# Patient Record
Sex: Female | Born: 1945
Health system: Southern US, Community
[De-identification: ages and names within clinical notes are randomized; demographics above are authoritative.]

## PROBLEM LIST (undated history)

## (undated) DIAGNOSIS — Z923 Personal history of irradiation: Secondary | ICD-10-CM

## (undated) DIAGNOSIS — Z8619 Personal history of other infectious and parasitic diseases: Secondary | ICD-10-CM

## (undated) DIAGNOSIS — F419 Anxiety disorder, unspecified: Secondary | ICD-10-CM

## (undated) DIAGNOSIS — K279 Peptic ulcer, site unspecified, unspecified as acute or chronic, without hemorrhage or perforation: Secondary | ICD-10-CM

## (undated) DIAGNOSIS — I119 Hypertensive heart disease without heart failure: Secondary | ICD-10-CM

## (undated) DIAGNOSIS — I6529 Occlusion and stenosis of unspecified carotid artery: Secondary | ICD-10-CM

## (undated) DIAGNOSIS — K219 Gastro-esophageal reflux disease without esophagitis: Secondary | ICD-10-CM

## (undated) DIAGNOSIS — I5032 Chronic diastolic (congestive) heart failure: Secondary | ICD-10-CM

## (undated) DIAGNOSIS — J449 Chronic obstructive pulmonary disease, unspecified: Secondary | ICD-10-CM

## (undated) DIAGNOSIS — D369 Benign neoplasm, unspecified site: Secondary | ICD-10-CM

## (undated) DIAGNOSIS — K5730 Diverticulosis of large intestine without perforation or abscess without bleeding: Secondary | ICD-10-CM

## (undated) DIAGNOSIS — I2510 Atherosclerotic heart disease of native coronary artery without angina pectoris: Secondary | ICD-10-CM

## (undated) DIAGNOSIS — E119 Type 2 diabetes mellitus without complications: Secondary | ICD-10-CM

## (undated) DIAGNOSIS — K648 Other hemorrhoids: Secondary | ICD-10-CM

## (undated) DIAGNOSIS — G4730 Sleep apnea, unspecified: Secondary | ICD-10-CM

## (undated) DIAGNOSIS — I10 Essential (primary) hypertension: Secondary | ICD-10-CM

## (undated) DIAGNOSIS — M1990 Unspecified osteoarthritis, unspecified site: Secondary | ICD-10-CM

## (undated) HISTORY — DX: Hypertensive heart disease without heart failure: I11.9

## (undated) HISTORY — DX: Gastro-esophageal reflux disease without esophagitis: K21.9

## (undated) HISTORY — DX: Benign neoplasm, unspecified site: D36.9

## (undated) HISTORY — PX: TUBAL LIGATION: SHX77

## (undated) HISTORY — PX: BREAST EXCISIONAL BIOPSY: SUR124

## (undated) HISTORY — DX: Personal history of other infectious and parasitic diseases: Z86.19

## (undated) HISTORY — PX: BREAST SURGERY: SHX581

## (undated) HISTORY — PX: OTHER SURGICAL HISTORY: SHX169

## (undated) HISTORY — DX: Diverticulosis of large intestine without perforation or abscess without bleeding: K57.30

## (undated) HISTORY — DX: Other hemorrhoids: K64.8

## (undated) HISTORY — PX: CORONARY ANGIOPLASTY: SHX604

## (undated) HISTORY — DX: Chronic diastolic (congestive) heart failure: I50.32

## (undated) HISTORY — DX: Peptic ulcer, site unspecified, unspecified as acute or chronic, without hemorrhage or perforation: K27.9

## (undated) HISTORY — DX: Anxiety disorder, unspecified: F41.9

## (undated) HISTORY — DX: Essential (primary) hypertension: I10

---

## 1987-10-27 DIAGNOSIS — K279 Peptic ulcer, site unspecified, unspecified as acute or chronic, without hemorrhage or perforation: Secondary | ICD-10-CM

## 1987-10-27 HISTORY — DX: Peptic ulcer, site unspecified, unspecified as acute or chronic, without hemorrhage or perforation: K27.9

## 2000-04-13 ENCOUNTER — Other Ambulatory Visit: Admission: RE | Admit: 2000-04-13 | Discharge: 2000-04-13 | Payer: Self-pay | Admitting: Internal Medicine

## 2000-10-26 DIAGNOSIS — Z124 Encounter for screening for malignant neoplasm of cervix: Secondary | ICD-10-CM

## 2000-10-26 HISTORY — DX: Encounter for screening for malignant neoplasm of cervix: Z12.4

## 2001-10-26 DIAGNOSIS — J45909 Unspecified asthma, uncomplicated: Secondary | ICD-10-CM

## 2001-10-26 DIAGNOSIS — G4700 Insomnia, unspecified: Secondary | ICD-10-CM

## 2001-10-26 HISTORY — DX: Insomnia, unspecified: G47.00

## 2001-10-26 HISTORY — DX: Unspecified asthma, uncomplicated: J45.909

## 2002-01-03 ENCOUNTER — Other Ambulatory Visit: Admission: RE | Admit: 2002-01-03 | Discharge: 2002-01-03 | Payer: Self-pay | Admitting: Internal Medicine

## 2003-04-25 ENCOUNTER — Other Ambulatory Visit: Admission: RE | Admit: 2003-04-25 | Discharge: 2003-04-25 | Payer: Self-pay | Admitting: Internal Medicine

## 2003-11-08 ENCOUNTER — Encounter: Admission: RE | Admit: 2003-11-08 | Discharge: 2003-11-08 | Payer: Self-pay | Admitting: Internal Medicine

## 2004-09-22 ENCOUNTER — Other Ambulatory Visit: Admission: RE | Admit: 2004-09-22 | Discharge: 2004-09-22 | Payer: Self-pay | Admitting: Internal Medicine

## 2004-09-22 ENCOUNTER — Ambulatory Visit: Payer: Self-pay | Admitting: Internal Medicine

## 2004-10-02 ENCOUNTER — Ambulatory Visit: Payer: Self-pay | Admitting: Internal Medicine

## 2004-10-10 ENCOUNTER — Ambulatory Visit: Payer: Self-pay | Admitting: Internal Medicine

## 2005-01-08 ENCOUNTER — Ambulatory Visit: Payer: Self-pay | Admitting: Internal Medicine

## 2005-02-19 ENCOUNTER — Ambulatory Visit: Payer: Self-pay | Admitting: Internal Medicine

## 2005-04-21 ENCOUNTER — Ambulatory Visit: Payer: Self-pay | Admitting: Internal Medicine

## 2005-07-21 ENCOUNTER — Ambulatory Visit: Payer: Self-pay | Admitting: Internal Medicine

## 2005-10-15 ENCOUNTER — Ambulatory Visit: Payer: Self-pay | Admitting: Internal Medicine

## 2005-10-27 ENCOUNTER — Ambulatory Visit: Payer: Self-pay | Admitting: Internal Medicine

## 2005-11-03 ENCOUNTER — Ambulatory Visit: Payer: Self-pay | Admitting: Internal Medicine

## 2005-11-11 ENCOUNTER — Ambulatory Visit: Payer: Self-pay | Admitting: Gastroenterology

## 2005-11-17 ENCOUNTER — Ambulatory Visit: Payer: Self-pay | Admitting: Internal Medicine

## 2005-11-23 ENCOUNTER — Encounter (INDEPENDENT_AMBULATORY_CARE_PROVIDER_SITE_OTHER): Payer: Self-pay | Admitting: *Deleted

## 2005-11-23 ENCOUNTER — Ambulatory Visit: Payer: Self-pay | Admitting: Gastroenterology

## 2005-11-23 DIAGNOSIS — D369 Benign neoplasm, unspecified site: Secondary | ICD-10-CM

## 2005-11-23 HISTORY — DX: Benign neoplasm, unspecified site: D36.9

## 2005-12-08 ENCOUNTER — Other Ambulatory Visit: Admission: RE | Admit: 2005-12-08 | Discharge: 2005-12-08 | Payer: Self-pay | Admitting: Obstetrics and Gynecology

## 2005-12-17 ENCOUNTER — Ambulatory Visit: Payer: Self-pay | Admitting: Internal Medicine

## 2005-12-29 ENCOUNTER — Ambulatory Visit: Payer: Self-pay | Admitting: Internal Medicine

## 2006-02-26 ENCOUNTER — Ambulatory Visit: Payer: Self-pay | Admitting: Internal Medicine

## 2006-03-13 ENCOUNTER — Emergency Department (HOSPITAL_COMMUNITY): Admission: EM | Admit: 2006-03-13 | Discharge: 2006-03-13 | Payer: Self-pay | Admitting: Emergency Medicine

## 2006-03-15 ENCOUNTER — Ambulatory Visit: Payer: Self-pay | Admitting: Internal Medicine

## 2006-03-30 ENCOUNTER — Ambulatory Visit: Payer: Self-pay | Admitting: Internal Medicine

## 2006-05-31 ENCOUNTER — Ambulatory Visit: Payer: Self-pay | Admitting: Internal Medicine

## 2006-07-30 ENCOUNTER — Ambulatory Visit: Payer: Self-pay | Admitting: Internal Medicine

## 2006-08-06 ENCOUNTER — Ambulatory Visit: Payer: Self-pay | Admitting: Internal Medicine

## 2006-09-30 ENCOUNTER — Ambulatory Visit: Payer: Self-pay | Admitting: Internal Medicine

## 2006-10-26 DIAGNOSIS — I10 Essential (primary) hypertension: Secondary | ICD-10-CM

## 2006-10-26 DIAGNOSIS — M1990 Unspecified osteoarthritis, unspecified site: Secondary | ICD-10-CM

## 2006-10-26 DIAGNOSIS — F32A Depression, unspecified: Secondary | ICD-10-CM

## 2006-10-26 HISTORY — DX: Unspecified osteoarthritis, unspecified site: M19.90

## 2006-10-26 HISTORY — DX: Depression, unspecified: F32.A

## 2006-10-26 HISTORY — DX: Essential (primary) hypertension: I10

## 2006-11-02 ENCOUNTER — Ambulatory Visit: Payer: Self-pay | Admitting: Internal Medicine

## 2006-12-14 ENCOUNTER — Ambulatory Visit: Payer: Self-pay | Admitting: Internal Medicine

## 2007-02-28 ENCOUNTER — Ambulatory Visit: Payer: Self-pay | Admitting: Internal Medicine

## 2007-02-28 LAB — CONVERTED CEMR LAB
ALT: 17 units/L (ref 0–40)
AST: 20 units/L (ref 0–37)
Albumin: 3.7 g/dL (ref 3.5–5.2)
Alkaline Phosphatase: 76 units/L (ref 39–117)
BUN: 15 mg/dL (ref 6–23)
Bilirubin, Direct: 0.1 mg/dL (ref 0.0–0.3)
CO2: 32 meq/L (ref 19–32)
Calcium: 9.5 mg/dL (ref 8.4–10.5)
Chloride: 103 meq/L (ref 96–112)
Cholesterol: 182 mg/dL (ref 0–200)
Creatinine, Ser: 1 mg/dL (ref 0.4–1.2)
Direct LDL: 91.9 mg/dL
GFR calc Af Amer: 73 mL/min
GFR calc non Af Amer: 60 mL/min
Glucose, Bld: 122 mg/dL — ABNORMAL HIGH (ref 70–99)
HDL: 30.5 mg/dL — ABNORMAL LOW (ref >39.0)
Potassium: 3.7 meq/L (ref 3.5–5.1)
Sodium: 143 meq/L (ref 135–145)
Total Bilirubin: 0.7 mg/dL (ref 0.3–1.2)
Total CHOL/HDL Ratio: 6
Total Protein: 6.9 g/dL (ref 6.0–8.3)
Triglycerides: 217 mg/dL (ref 0–149)
VLDL: 43 mg/dL — ABNORMAL HIGH (ref 0–40)

## 2007-03-07 ENCOUNTER — Ambulatory Visit: Payer: Self-pay | Admitting: Internal Medicine

## 2007-04-22 DIAGNOSIS — I10 Essential (primary) hypertension: Secondary | ICD-10-CM | POA: Insufficient documentation

## 2007-04-22 DIAGNOSIS — F329 Major depressive disorder, single episode, unspecified: Secondary | ICD-10-CM

## 2007-04-22 DIAGNOSIS — M1990 Unspecified osteoarthritis, unspecified site: Secondary | ICD-10-CM | POA: Insufficient documentation

## 2007-04-22 DIAGNOSIS — F32A Depression, unspecified: Secondary | ICD-10-CM | POA: Insufficient documentation

## 2007-04-22 DIAGNOSIS — K5730 Diverticulosis of large intestine without perforation or abscess without bleeding: Secondary | ICD-10-CM | POA: Insufficient documentation

## 2007-04-22 DIAGNOSIS — J45909 Unspecified asthma, uncomplicated: Secondary | ICD-10-CM | POA: Insufficient documentation

## 2007-04-22 HISTORY — DX: Diverticulosis of large intestine without perforation or abscess without bleeding: K57.30

## 2007-05-17 ENCOUNTER — Ambulatory Visit: Payer: Self-pay | Admitting: Internal Medicine

## 2007-05-17 DIAGNOSIS — M5137 Other intervertebral disc degeneration, lumbosacral region: Secondary | ICD-10-CM | POA: Insufficient documentation

## 2007-06-01 ENCOUNTER — Encounter: Payer: Self-pay | Admitting: Internal Medicine

## 2007-07-01 ENCOUNTER — Ambulatory Visit: Payer: Self-pay | Admitting: Internal Medicine

## 2007-07-01 DIAGNOSIS — M545 Low back pain, unspecified: Secondary | ICD-10-CM | POA: Insufficient documentation

## 2007-10-10 ENCOUNTER — Ambulatory Visit: Payer: Self-pay | Admitting: Internal Medicine

## 2007-10-10 DIAGNOSIS — M533 Sacrococcygeal disorders, not elsewhere classified: Secondary | ICD-10-CM | POA: Insufficient documentation

## 2007-12-13 ENCOUNTER — Ambulatory Visit: Payer: Self-pay | Admitting: Internal Medicine

## 2008-03-06 ENCOUNTER — Ambulatory Visit: Payer: Self-pay | Admitting: Internal Medicine

## 2008-06-08 ENCOUNTER — Ambulatory Visit: Payer: Self-pay | Admitting: Internal Medicine

## 2008-06-20 ENCOUNTER — Telehealth: Payer: Self-pay | Admitting: Internal Medicine

## 2008-08-10 ENCOUNTER — Ambulatory Visit: Payer: Self-pay | Admitting: Internal Medicine

## 2008-10-02 ENCOUNTER — Ambulatory Visit: Payer: Self-pay | Admitting: Internal Medicine

## 2008-10-04 ENCOUNTER — Telehealth: Payer: Self-pay | Admitting: Internal Medicine

## 2008-10-11 ENCOUNTER — Telehealth: Payer: Self-pay | Admitting: Internal Medicine

## 2008-10-29 ENCOUNTER — Ambulatory Visit: Payer: Self-pay | Admitting: Internal Medicine

## 2008-10-29 DIAGNOSIS — T7840XA Allergy, unspecified, initial encounter: Secondary | ICD-10-CM | POA: Insufficient documentation

## 2008-10-29 DIAGNOSIS — B029 Zoster without complications: Secondary | ICD-10-CM | POA: Insufficient documentation

## 2009-01-01 ENCOUNTER — Ambulatory Visit: Payer: Self-pay | Admitting: Internal Medicine

## 2009-01-01 DIAGNOSIS — J209 Acute bronchitis, unspecified: Secondary | ICD-10-CM | POA: Insufficient documentation

## 2009-02-01 ENCOUNTER — Ambulatory Visit: Payer: Self-pay | Admitting: Internal Medicine

## 2009-02-01 DIAGNOSIS — F458 Other somatoform disorders: Secondary | ICD-10-CM | POA: Insufficient documentation

## 2009-03-04 ENCOUNTER — Ambulatory Visit: Payer: Self-pay | Admitting: Internal Medicine

## 2009-03-04 DIAGNOSIS — R059 Cough, unspecified: Secondary | ICD-10-CM | POA: Insufficient documentation

## 2009-03-04 DIAGNOSIS — R05 Cough: Secondary | ICD-10-CM

## 2009-03-04 LAB — CONVERTED CEMR LAB
BUN: 15 mg/dL (ref 6–23)
Basophils Absolute: 0.1 10*3/uL (ref 0.0–0.1)
Basophils Relative: 0.9 % (ref 0.0–3.0)
CO2: 31 meq/L (ref 19–32)
Calcium: 10.1 mg/dL (ref 8.4–10.5)
Chloride: 103 meq/L (ref 96–112)
Creatinine, Ser: 1 mg/dL (ref 0.4–1.2)
Eosinophils Absolute: 0.3 10*3/uL (ref 0.0–0.7)
Eosinophils Relative: 2.7 % (ref 0.0–5.0)
GFR calc non Af Amer: 59.58 mL/min (ref >60)
Glucose, Bld: 83 mg/dL (ref 70–99)
HCT: 39.4 % (ref 36.0–46.0)
Hemoglobin: 13.3 g/dL (ref 12.0–15.0)
IgE (Immunoglobulin E), Serum: 91.5 intl units/mL (ref 0.0–180.0)
Lymphocytes Relative: 24 % (ref 12.0–46.0)
Lymphs Abs: 3 10*3/uL (ref 0.7–4.0)
MCHC: 33.6 g/dL (ref 30.0–36.0)
MCV: 92.2 fL (ref 78.0–100.0)
Monocytes Absolute: 0.1 10*3/uL (ref 0.1–1.0)
Monocytes Relative: 1.1 % — ABNORMAL LOW (ref 3.0–12.0)
Neutro Abs: 8.8 10*3/uL — ABNORMAL HIGH (ref 1.4–7.7)
Neutrophils Relative %: 71.3 % (ref 43.0–77.0)
Platelets: 347 10*3/uL (ref 150.0–400.0)
Potassium: 4.5 meq/L (ref 3.5–5.1)
RBC: 4.27 M/uL (ref 3.87–5.11)
RDW: 12.6 % (ref 11.5–14.6)
Sodium: 142 meq/L (ref 135–145)
WBC: 12.3 10*3/uL — ABNORMAL HIGH (ref 4.5–10.5)

## 2009-03-05 ENCOUNTER — Ambulatory Visit: Payer: Self-pay | Admitting: Internal Medicine

## 2009-03-07 ENCOUNTER — Ambulatory Visit: Payer: Self-pay | Admitting: Cardiovascular Disease

## 2009-03-18 ENCOUNTER — Ambulatory Visit: Payer: Self-pay | Admitting: Internal Medicine

## 2009-04-16 ENCOUNTER — Ambulatory Visit: Payer: Self-pay | Admitting: Internal Medicine

## 2009-04-16 DIAGNOSIS — M722 Plantar fascial fibromatosis: Secondary | ICD-10-CM | POA: Insufficient documentation

## 2009-05-15 ENCOUNTER — Ambulatory Visit: Payer: Self-pay | Admitting: Internal Medicine

## 2009-05-26 HISTORY — PX: CATARACT EXTRACTION W/ INTRAOCULAR LENS  IMPLANT, BILATERAL: SHX1307

## 2009-07-12 ENCOUNTER — Ambulatory Visit: Payer: Self-pay | Admitting: Internal Medicine

## 2009-07-19 ENCOUNTER — Telehealth: Payer: Self-pay | Admitting: Internal Medicine

## 2009-07-23 ENCOUNTER — Telehealth: Payer: Self-pay | Admitting: Internal Medicine

## 2009-07-23 ENCOUNTER — Ambulatory Visit: Payer: Self-pay | Admitting: Internal Medicine

## 2009-07-24 ENCOUNTER — Ambulatory Visit: Payer: Self-pay | Admitting: Cardiovascular Disease

## 2009-08-07 ENCOUNTER — Telehealth: Payer: Self-pay | Admitting: Internal Medicine

## 2009-08-09 ENCOUNTER — Encounter: Payer: Self-pay | Admitting: Internal Medicine

## 2009-09-23 ENCOUNTER — Ambulatory Visit: Payer: Self-pay | Admitting: Internal Medicine

## 2009-09-23 ENCOUNTER — Encounter (INDEPENDENT_AMBULATORY_CARE_PROVIDER_SITE_OTHER): Payer: Self-pay | Admitting: *Deleted

## 2009-11-25 ENCOUNTER — Ambulatory Visit: Payer: Self-pay | Admitting: Internal Medicine

## 2009-11-28 ENCOUNTER — Telehealth: Payer: Self-pay | Admitting: Internal Medicine

## 2010-01-06 ENCOUNTER — Ambulatory Visit: Payer: Self-pay | Admitting: Internal Medicine

## 2010-03-04 ENCOUNTER — Ambulatory Visit: Payer: Self-pay | Admitting: Internal Medicine

## 2010-05-07 ENCOUNTER — Ambulatory Visit: Payer: Self-pay | Admitting: Internal Medicine

## 2010-07-16 ENCOUNTER — Telehealth: Payer: Self-pay | Admitting: Internal Medicine

## 2010-07-17 ENCOUNTER — Telehealth: Payer: Self-pay | Admitting: Internal Medicine

## 2010-07-30 ENCOUNTER — Ambulatory Visit: Payer: Self-pay | Admitting: Internal Medicine

## 2010-10-26 DIAGNOSIS — K219 Gastro-esophageal reflux disease without esophagitis: Secondary | ICD-10-CM

## 2010-10-26 HISTORY — DX: Gastro-esophageal reflux disease without esophagitis: K21.9

## 2010-10-31 ENCOUNTER — Ambulatory Visit
Admission: RE | Admit: 2010-10-31 | Discharge: 2010-10-31 | Payer: Self-pay | Source: Home / Self Care | Attending: Internal Medicine | Admitting: Internal Medicine

## 2010-10-31 DIAGNOSIS — K279 Peptic ulcer, site unspecified, unspecified as acute or chronic, without hemorrhage or perforation: Secondary | ICD-10-CM | POA: Insufficient documentation

## 2010-10-31 DIAGNOSIS — R198 Other specified symptoms and signs involving the digestive system and abdomen: Secondary | ICD-10-CM | POA: Insufficient documentation

## 2010-11-06 ENCOUNTER — Encounter: Payer: Self-pay | Admitting: Gastroenterology

## 2010-11-27 NOTE — Letter (Signed)
Summary: Colonoscopy Letter  [facility]  [address] [place], [place] [address]   Phone: [phone]  Fax: [phone]      November 06, 2010 MRN: [id number]   Felicia Perry [address] [place], [place]  [address]   Dear Felicia Perry,   According to your medical record, it is time for you to schedule a Colonoscopy. The American Cancer Society recommends this procedure as a method to detect early colon cancer. Patients with a family history of colon cancer, or a personal history of colon polyps or inflammatory bowel disease are at increased risk.  This letter has been generated based on the recommendations made at the time of your procedure. If you feel that in your particular situation this may no longer apply, please contact our office.  Please call our office at [phone] to schedule this appointment or to update your records at your earliest convenience.  Thank you for cooperating with [place] to provide you with the very best care possible.   Sincerely,  Felicia Perry, M.D.  [facility] [phone]

## 2010-11-27 NOTE — Progress Notes (Signed)
Summary: new rx LMTCB 9-21  Phone Note Call from Patient Call back at Home Phone [phone]   Caller: Patient Call For: [name] MD Reason for Call: Acute Illness Summary of Call: pt needs new rx flexiril [facility] [place] [phone] Initial call taken by: Heron Sabins,  July 16, 2010 12:47 PM  Follow-up for Phone Call        not listed as new or old med--why does she need it Follow-up by: Willy Eddy, LPN,  July 16, 2010 1:01 PM  Additional Follow-up for Phone Call Additional follow up Details #1::        LMTCB  LMTCB again Lynann Beaver [facility]  July 17, 2010 3:20 PM  Additional Follow-up by: Lynann Beaver CMA,  July 16, 2010 1:31 PM

## 2010-11-27 NOTE — Progress Notes (Signed)
Summary: please advise  Phone Note Call from Patient Call back at Work Phone [phone]   Caller: [facility] mail Reason for Call: Acute Illness Summary of Call: Woke up this morning with a terrific headache and fever. May have been exposed to someone . Not sure what to take. Has asthma and severe allergies. Please advise patient. Her pharmacy is [facility] on [address] and [address]. Initial call taken by: Warnell Forester,  November 28, 2009 8:22 AM  Follow-up for Phone Call        talked with pt and hse has chills and cough and eyes itching- suggested chloraciden hpb and tylenol for fever- will try and if not better tomorrow will call back Follow-up by: Willy Eddy, LPN,  November 28, 2009 9:15 AM

## 2010-11-27 NOTE — Progress Notes (Signed)
Summary: Pt called re: Flexirill. Pt was given samples by Dr Lovell Sheehan  Phone Note Call from Patient Call back at Work Phone [phone]   Caller: Patient Summary of Call: Pt called back re: Flexirill and said that Dr Lovell Sheehan had given pt samples for bulging disk, lower back pain. Pt says that they worked well. Pt took med at night and med would relax her back enough to sleep. Pls call in to [facility] on [address]  Pt is sch to come in for ov in October.    Initial call taken by: Lucy Antigua,  July 17, 2010 4:33 PM  Follow-up for Phone Call        call in 10 three times a day as needed number 30 refill 3 Follow-up by: [name] MD,  July 18, 2010 8:32 AM    New/Updated Medications: FLEXERIL 10 MG TABS (CYCLOBENZAPRINE HCL) one by mouth tid Prescriptions: FLEXERIL 10 MG TABS (CYCLOBENZAPRINE HCL) one by mouth tid  #30 x 3   Entered by:   Lynann Beaver CMA   Authorized by:   [name] MD   Signed by:   Lynann Beaver CMA on 07/18/2010   Method used:   Electronically to        [facility] [address] [phone]* (retail)       [address]       [place], [place]  [address]       Ph: [phone] or [phone]       Fax: [phone]   RxID:   [id number]

## 2010-11-27 NOTE — Assessment & Plan Note (Signed)
Summary: 2 MONTH FUP//CCM   Vital Signs:  Patient profile:   65 year old female Height:      62 inches Weight:      156 pounds BMI:     28.64 Temp:     98.2 degrees F oral Pulse rate:   76 / minute Resp:     14 per minute BP sitting:   180 / 90  (left arm)  Vitals Entered By: Willy Eddy, LPN (November 25, 2009 4:31 PM) CC: roa   CC:  roa.  History of Present Illness: increased redness of eye and sneezing fits no symptoms of chest pain or tight nress but blod pressure was up today she states that she is compliant with medications but had a severe coughing fit prior to coming in to the office   Preventive Screening-Counseling & Management  Alcohol-Tobacco     Smoking Status: quit  Problems Prior to Update: 1)  Acute Serous Otitis Media  (ICD-381.01) 2)  Plantar Fasciitis, Right  (ICD-728.71) 3)  Obstructive Chronic Bronchitis With Exacerbation  (ICD-491.21) 4)  Urticaria  (ICD-708.9) 5)  Cough, Chronic  (ICD-786.2) 6)  Anhedonia  (ICD-300.89) 7)  Acute Bronchitis  (ICD-466.0) 8)  Allergic Reaction, Acute  (ICD-995.3) 9)  Shingles  (ICD-053.9) 10)  Uri  (ICD-465.9) 11)  Cervical Disc Disorder  (ICD-722.91) 12)  Low Back Pain  (ICD-724.2) 13)  Degeneration, Lumbar/lumbosacral Disc  (ICD-722.52) 14)  Family History Diabetes 1st Degree Relative  (ICD-V18.0) 15)  Diverticulosis, Colon  (ICD-562.10) 16)  Osteoarthritis  (ICD-715.90) 17)  Hypertension  (ICD-401.9) 18)  Asthma  (ICD-493.90) 19)  Anxiety  (ICD-300.00)  Current Problems (verified): 1)  Acute Serous Otitis Media  (ICD-381.01) 2)  Plantar Fasciitis, Right  (ICD-728.71) 3)  Obstructive Chronic Bronchitis With Exacerbation  (ICD-491.21) 4)  Urticaria  (ICD-708.9) 5)  Cough, Chronic  (ICD-786.2) 6)  Anhedonia  (ICD-300.89) 7)  Acute Bronchitis  (ICD-466.0) 8)  Allergic Reaction, Acute  (ICD-995.3) 9)  Shingles  (ICD-053.9) 10)  Uri  (ICD-465.9) 11)  Cervical Disc Disorder  (ICD-722.91) 12)  Low  Back Pain  (ICD-724.2) 13)  Degeneration, Lumbar/lumbosacral Disc  (ICD-722.52) 14)  Family History Diabetes 1st Degree Relative  (ICD-V18.0) 15)  Diverticulosis, Colon  (ICD-562.10) 16)  Osteoarthritis  (ICD-715.90) 17)  Hypertension  (ICD-401.9) 18)  Asthma  (ICD-493.90) 19)  Anxiety  (ICD-300.00)  Medications Prior to Update: 1)  Alprazolam 0.25 Mg Tabs (Alprazolam) .... As Needed 2)  Exforge 5-320 Mg Tabs (Amlodipine Besylate-Valsartan) .... One By Mouth Daily 3)  Citalopram Hydrobromide 20 Mg Tabs (Citalopram Hydrobromide) .Marland Kitchen.. 1 Once Daily -Changed From Lexapro Due To Insurance 4)  Nadolol 40 Mg Tabs (Nadolol) .... One By Mouth Daily 5)  Symbicort 160-4.5 Mcg/act  Aero (Budesonide-Formoterol Fumarate) .... Two Puf By Mouth Bid 6)  Proventil Hfa 108 (90 Base) Mcg/act Aers (Albuterol Sulfate) .... 2 Puffs Every 4 Hours As Needed Wheezing, Chest Tightness 7)  Wellbutrin Xl 300 Mg Xr24h-Tab (Bupropion Hcl) .... One By Mouth Daily 8)  Calcium 500 Mg Tabs (Calcium Carbonate) .... 2 Once Daily 9)  Fish Oil 500 Mg Caps (Omega-3 Fatty Acids) .... 3 Once Daily 10)  Benadryl 25 Mg Caps (Diphenhydramine Hcl) .... As Needed 11)  Astepro 0.15 % Soln (Azelastine Hcl) .... Use As Directed  Current Medications (verified): 1)  Alprazolam 0.25 Mg Tabs (Alprazolam) .... As Needed 2)  Exforge 10-160 Mg Tabs (Amlodipine Besylate-Valsartan) .... One By Mouth Daily 3)  Citalopram Hydrobromide 20 Mg Tabs (Citalopram Hydrobromide) .Marland KitchenMarland KitchenMarland Kitchen  1 Once Daily -Changed From Lexapro Due To Insurance 4)  Nadolol 40 Mg Tabs (Nadolol) .... One By Mouth Daily 5)  Symbicort 160-4.5 Mcg/act  Aero (Budesonide-Formoterol Fumarate) .... Two Puf By Mouth Bid 6)  Proventil Hfa 108 (90 Base) Mcg/act Aers (Albuterol Sulfate) .... 2 Puffs Every 4 Hours As Needed Wheezing, Chest Tightness 7)  Wellbutrin Xl 300 Mg Xr24h-Tab (Bupropion Hcl) .... One By Mouth Daily 8)  Calcium 500 Mg Tabs (Calcium Carbonate) .... 2 Once Daily 9)  Fish  Oil 500 Mg Caps (Omega-3 Fatty Acids) .... 3 Once Daily 10)  Benadryl 25 Mg Caps (Diphenhydramine Hcl) .... As Needed 11)  Astepro 0.15 % Soln (Azelastine Hcl) .... Use As Directed 12)  Omnaris 50 Mcg/act Susp (Ciclesonide) .... Two Spray in Nostril Daily  Allergies (verified): 1)  ! Penicillin V Potassium (Penicillin V Potassium) 2)  ! Cephalosporins 3)  ! Cleocin (Clindamycin Hcl) 4)  Levaquin (Levofloxacin) 5)  Ambien (Zolpidem Tartrate) 6)  Biaxin (Clarithromycin) 7)  Doxycycline Hyclate (Doxycycline Hyclate)  Past History:  Family History: Last updated: 05/17/2007 Family History Diabetes 1st degree relative fther Family History Hypertension Family History of Neurological disorder mother with alztheimenrs  Social History: Last updated: 05/17/2007 Occupation: cleaning Single Former Smoker  Risk Factors: Smoking Status: quit (11/25/2009)  Past medical, surgical, family and social histories (including risk factors) reviewed, and no changes noted (except as noted below).  Past Medical History: Reviewed history from 10/29/2008 and no changes required. Anxiety Asthma Hypertension Osteoarthritis Diverticulosis, colon Low back pain shingles  Past Surgical History: Reviewed history from 05/17/2007 and no changes required. Denies major surgical history  Family History: Reviewed history from 05/17/2007 and no changes required. Family History Diabetes 1st degree relative fther Family History Hypertension Family History of Neurological disorder mother with alztheimenrs  Social History: Reviewed history from 05/17/2007 and no changes required. Occupation: cleaning Single Former Smoker  Review of Systems  The patient denies anorexia, fever, weight loss, weight gain, vision loss, decreased hearing, hoarseness, chest pain, syncope, dyspnea on exertion, peripheral edema, prolonged cough, headaches, hemoptysis, abdominal pain, melena, hematochezia, severe  indigestion/heartburn, hematuria, incontinence, genital sores, muscle weakness, suspicious skin lesions, transient blindness, difficulty walking, depression, unusual weight change, abnormal bleeding, enlarged lymph nodes, angioedema, breast masses, and testicular masses.    Physical Exam  General:  Well-developed,well-nourished,in no acute distress; alert,appropriate and cooperative throughout examination Head:  Normocephalic and atraumatic without obvious abnormalities. No apparent alopecia or balding. Eyes:  injected eyes and tearing Ears:  R ear normal and L ear normal.   Nose:  no external deformity, nasal dischargemucosal pallor, mucosal erythema, and mucosal edema.   Mouth:  pharyngeal exudate and posterior lymphoid hypertrophy.   Neck:  No deformities, masses, or tenderness noted. Lungs:  normal respiratory effort and no wheezes.   Heart:  normal rate and regular rhythm.   Abdomen:  Bowel sounds positive,abdomen soft and non-tender without masses, organomegaly or hernias noted.   Impression & Recommendations:  Problem # 1:  HYPERTENSION (ICD-401.9) Assessment Unchanged  Her updated medication list for this problem includes:    Exforge 10-160 Mg Tabs (Amlodipine besylate-valsartan) ..... One by mouth daily    Nadolol 40 Mg Tabs (Nadolol) ..... One by mouth daily  BP today: 180/90 Prior BP: 150/86 (09/23/2009)  Prior 10 Yr Risk Heart Disease: Not enough information (10/10/2007)  Labs Reviewed: K+: 4.5 (03/04/2009) Creat: : 1.0 (03/04/2009)   Chol: 182 (02/28/2007)   HDL: 30.5 (02/28/2007)   LDL: DEL (02/28/2007)   TG:  217 (02/28/2007)  Problem # 2:  ALLERGIC REACTION, ACUTE (ICD-995.3) Assessment: Unchanged acute allergies  Problem # 3:  COUGH, CHRONIC (ICD-786.2) due to allergies  Problem # 4:  ANXIETY (ICD-300.00)  Her updated medication list for this problem includes:    Alprazolam 0.25 Mg Tabs (Alprazolam) .Marland Kitchen... As needed    Citalopram Hydrobromide 20 Mg Tabs  (Citalopram hydrobromide) .Marland Kitchen... 1 once daily -changed from lexapro due to insurance    Wellbutrin Xl 300 Mg Xr24h-tab (Bupropion hcl) ..... One by mouth daily  Discussed medication use and relaxation techniques.   Complete Medication List: 1)  Alprazolam 0.25 Mg Tabs (Alprazolam) .... As needed 2)  Exforge 10-160 Mg Tabs (Amlodipine besylate-valsartan) .... One by mouth daily 3)  Citalopram Hydrobromide 20 Mg Tabs (Citalopram hydrobromide) .Marland Kitchen.. 1 once daily -changed from lexapro due to insurance 4)  Nadolol 40 Mg Tabs (Nadolol) .... One by mouth daily 5)  Symbicort 160-4.5 Mcg/act Aero (Budesonide-formoterol fumarate) .... Two puf by mouth bid 6)  Proventil Hfa 108 (90 Base) Mcg/act Aers (Albuterol sulfate) .... 2 puffs every 4 hours as needed wheezing, chest tightness 7)  Wellbutrin Xl 300 Mg Xr24h-tab (Bupropion hcl) .... One by mouth daily 8)  Calcium 500 Mg Tabs (Calcium carbonate) .... 2 once daily 9)  Fish Oil 500 Mg Caps (Omega-3 fatty acids) .... 3 once daily 10)  Benadryl 25 Mg Caps (Diphenhydramine hcl) .... As needed 11)  Astepro 0.15 % Soln (Azelastine hcl) .... Use as directed 12)  Omnaris 50 Mcg/act Susp (Ciclesonide) .... Two spray in nostril daily  Patient Instructions: 1)  Please schedule a follow-up appointment in 6 weeks.

## 2010-11-27 NOTE — Assessment & Plan Note (Signed)
Summary: 3 momnth rov./njr   Vital Signs:  Patient profile:   65 year old female Height:      62 inches Weight:      141 pounds BMI:     25.88 Temp:     98.2 degrees F oral Pulse rate:   72 / minute Resp:     14 per minute BP sitting:   130 / 80  (left arm)  Vitals Entered By: Willy Eddy, LPN (July 30, 2010 4:20 PM) CC: roa Is Patient Diabetic? No   Primary Care Provider:  [name] MD  CC:  roa.  History of Present Illness: The pts allergies have ben controlled with the maintanace drugs ( inhailers) and the allegra   Asthma History    Initial Asthma Severity Rating:    Age range: 12+ years    Symptoms: 0-2 days/week    Nighttime Awakenings: 0-2/month    Interferes w/ normal activity: no limitations    SABA use (not for EIB): 0-2 days/week    Asthma Severity Assessment: Intermittent    Preventive Screening-Counseling & Management  Alcohol-Tobacco     Smoking Status: quit     Year Quit: 1989     Tobacco Counseling: not indicated; no tobacco use  Problems Prior to Update: 1)  Acute Serous Otitis Media  (ICD-381.01) 2)  Plantar Fasciitis, Right  (ICD-728.71) 3)  Obstructive Chronic Bronchitis With Exacerbation  (ICD-491.21) 4)  Urticaria  (ICD-708.9) 5)  Cough, Chronic  (ICD-786.2) 6)  Anhedonia  (ICD-300.89) 7)  Acute Bronchitis  (ICD-466.0) 8)  Allergic Reaction, Acute  (ICD-995.3) 9)  Shingles  (ICD-053.9) 10)  Uri  (ICD-465.9) 11)  Cervical Disc Disorder  (ICD-722.91) 12)  Low Back Pain  (ICD-724.2) 13)  Degeneration, Lumbar/lumbosacral Disc  (ICD-722.52) 14)  Family History Diabetes 1st Degree Relative  (ICD-V18.0) 15)  Diverticulosis, Colon  (ICD-562.10) 16)  Osteoarthritis  (ICD-715.90) 17)  Hypertension  (ICD-401.9) 18)  Asthma  (ICD-493.90) 19)  Anxiety  (ICD-300.00)  Current Problems (verified): 1)  Acute Serous Otitis Media  (ICD-381.01) 2)  Plantar Fasciitis, Right  (ICD-728.71) 3)  Obstructive Chronic Bronchitis With  Exacerbation  (ICD-491.21) 4)  Urticaria  (ICD-708.9) 5)  Cough, Chronic  (ICD-786.2) 6)  Anhedonia  (ICD-300.89) 7)  Acute Bronchitis  (ICD-466.0) 8)  Allergic Reaction, Acute  (ICD-995.3) 9)  Shingles  (ICD-053.9) 10)  Uri  (ICD-465.9) 11)  Cervical Disc Disorder  (ICD-722.91) 12)  Low Back Pain  (ICD-724.2) 13)  Degeneration, Lumbar/lumbosacral Disc  (ICD-722.52) 14)  Family History Diabetes 1st Degree Relative  (ICD-V18.0) 15)  Diverticulosis, Colon  (ICD-562.10) 16)  Osteoarthritis  (ICD-715.90) 17)  Hypertension  (ICD-401.9) 18)  Asthma  (ICD-493.90) 19)  Anxiety  (ICD-300.00)  Medications Prior to Update: 1)  Alprazolam 0.25 Mg Tabs (Alprazolam) .... As Needed 2)  Exforge 10-160 Mg Tabs (Amlodipine Besylate-Valsartan) .... One By Mouth Daily 3)  Nadolol 40 Mg Tabs (Nadolol) .... One By Mouth Daily 4)  Symbicort 160-4.5 Mcg/act  Aero (Budesonide-Formoterol Fumarate) .... Two Puf By Mouth Bid 5)  Proventil Hfa 108 (90 Base) Mcg/act Aers (Albuterol Sulfate) .... 2 Puffs Every 4 Hours As Needed Wheezing, Chest Tightness 6)  Calcium 500 Mg Tabs (Calcium Carbonate) .... 2 Once Daily 7)  Fish Oil 500 Mg Caps (Omega-3 Fatty Acids) .... 3 Once Daily 8)  Benzonatate 200 Mg Caps (Benzonatate) .... One By Mouth Q 6 Hour As Needed Cough 9)  Flexeril 10 Mg Tabs (Cyclobenzaprine Hcl) .... One By Mouth Tid  Current Medications (verified): 1)  Alprazolam 0.25 Mg Tabs (Alprazolam) .... As Needed 2)  Exforge 10-160 Mg Tabs (Amlodipine Besylate-Valsartan) .... One By Mouth Daily 3)  Nadolol 40 Mg Tabs (Nadolol) .... One By Mouth Daily 4)  Symbicort 160-4.5 Mcg/act  Aero (Budesonide-Formoterol Fumarate) .... Two Puf By Mouth Bid 5)  Proventil Hfa 108 (90 Base) Mcg/act Aers (Albuterol Sulfate) .... 2 Puffs Every 4 Hours As Needed Wheezing, Chest Tightness 6)  Calcium 500 Mg Tabs (Calcium Carbonate) .... 2 Once Daily 7)  Fish Oil 500 Mg Caps (Omega-3 Fatty Acids) .... 3 Once Daily 8)   Benzonatate 200 Mg Caps (Benzonatate) .... One By Mouth Q 6 Hour As Needed Cough 9)  Flexeril 10 Mg Tabs (Cyclobenzaprine Hcl) .... One By Mouth Tid  Allergies (verified): 1)  ! Penicillin V Potassium (Penicillin V Potassium) 2)  ! Cephalosporins 3)  ! Cleocin (Clindamycin Hcl) 4)  Levaquin (Levofloxacin) 5)  Ambien (Zolpidem Tartrate) 6)  Biaxin (Clarithromycin) 7)  Doxycycline Hyclate (Doxycycline Hyclate)  Past History:  Family History: Last updated: 05/17/2007 Family History Diabetes 1st degree relative fther Family History Hypertension Family History of Neurological disorder mother with alztheimenrs  Social History: Last updated: 05/17/2007 Occupation: cleaning Single Former Smoker  Risk Factors: Smoking Status: quit (07/30/2010)  Past medical, surgical, family and social histories (including risk factors) reviewed, and no changes noted (except as noted below).  Past Medical History: Reviewed history from 10/29/2008 and no changes required. Anxiety Asthma Hypertension Osteoarthritis Diverticulosis, colon Low back pain shingles  Past Surgical History: Reviewed history from 05/17/2007 and no changes required. Denies major surgical history  Family History: Reviewed history from 05/17/2007 and no changes required. Family History Diabetes 1st degree relative fther Family History Hypertension Family History of Neurological disorder mother with alztheimenrs  Social History: Reviewed history from 05/17/2007 and no changes required. Occupation: Education officer, environmental Single Former Smoker  Review of Systems       .Flu Vaccine Consent Questions     Do you have a history of severe allergic reactions to this vaccine? no    Any prior history of allergic reactions to egg and/or gelatin? no    Do you have a sensitivity to the preservative Thimersol? no    Do you have a past history of Guillan-Barre Syndrome? no    Do you currently have an acute febrile illness? no    Have you  ever had a severe reaction to latex? no    Vaccine information given and explained to patient? yes    Are you currently pregnant? no    Lot Number:[id number]   Exp Date:04/25/2011   Site Given  Left Deltoid IM   Physical Exam  General:  Well-developed,well-nourished,in no acute distress; alert,appropriate and cooperative throughout examination Head:  Normocephalic and atraumatic without obvious abnormalities. No apparent alopecia or balding. Eyes:  injected eyes and tearing Ears:  R ear normal and L ear normal.   Nose:  no external deformity and no nasal discharge.   Mouth:  posterior lymphoid hypertrophy and postnasal drip.   Neck:  No deformities, masses, or tenderness noted. Lungs:  normal respiratory effort, R wheezes, and L wheezes.   Heart:  normal rate and regular rhythm.   Abdomen:  Bowel sounds positive,abdomen soft and non-tender without masses, organomegaly or hernias noted. Msk:  No deformity or scoliosis noted of thoracic or lumbar spine.   Pulses:  R and L carotid,radial,femoral,dorsalis pedis and posterior tibial pulses are full and equal bilaterally Extremities:  No clubbing, cyanosis, edema,  or deformity noted with normal full range of motion of all joints.   Neurologic:  alert & oriented X3.     Impression & Recommendations:  Problem # 1:  LOW BACK PAIN (ICD-724.2)  occupational risk for lifting Her updated medication list for this problem includes:    Flexeril 10 Mg Tabs (Cyclobenzaprine hcl) ..... One by mouth tid  Discussed use of moist heat or ice, modified activities, medications, and stretching/strengthening exercises. Back care instructions given. To be seen in 2 weeks if no improvement; sooner if worsening of symptoms.   Problem # 2:  DEGENERATION, LUMBAR/LUMBOSACRAL DISC (ICD-722.52) complicated by work radicular to the right  Problem # 3:  HYPERTENSION (ICD-401.9)  Her updated medication list for this problem includes:    Exforge 10-160 Mg Tabs  (Amlodipine besylate-valsartan) ..... One by mouth daily    Nadolol 40 Mg Tabs (Nadolol) ..... One by mouth daily  BP today: 130/80 Prior BP: 150/80 (05/07/2010)  Prior 10 Yr Risk Heart Disease: Not enough information (10/10/2007)  Labs Reviewed: K+: 4.5 (03/04/2009) Creat: : 1.0 (03/04/2009)   Chol: 182 (02/28/2007)   HDL: 30.5 (02/28/2007)   LDL: DEL (02/28/2007)   TG: 217 (02/28/2007)  Problem # 4:  ASTHMA (ICD-493.90)  Her updated medication list for this problem includes:    Symbicort 160-4.5 Mcg/act Aero (Budesonide-formoterol fumarate) .Marland Kitchen..Marland Kitchen Two puf by mouth bid    Proventil Hfa 108 (90 Base) Mcg/act Aers (Albuterol sulfate) .Marland Kitchen... 2 puffs every 4 hours as needed wheezing, chest tightness  Pulmonary Functions Reviewed: O2 sat: 98 (01/06/2010)  Current Asthma Management Plan:  Green Zone:  SYMBICORT 160-4.5 MCG/ACT  AERO Yellow Zone:  PROVENTIL HFA 108 (90 BASE) MCG/ACT AERS  Complete Medication List: 1)  Alprazolam 0.25 Mg Tabs (Alprazolam) .... As needed 2)  Exforge 10-160 Mg Tabs (Amlodipine besylate-valsartan) .... One by mouth daily 3)  Nadolol 40 Mg Tabs (Nadolol) .... One by mouth daily 4)  Symbicort 160-4.5 Mcg/act Aero (Budesonide-formoterol fumarate) .... Two puf by mouth bid 5)  Proventil Hfa 108 (90 Base) Mcg/act Aers (Albuterol sulfate) .... 2 puffs every 4 hours as needed wheezing, chest tightness 6)  Calcium 500 Mg Tabs (Calcium carbonate) .... 2 once daily 7)  Fish Oil 500 Mg Caps (Omega-3 fatty acids) .... 3 once daily 8)  Benzonatate 200 Mg Caps (Benzonatate) .... One by mouth q 6 hour as needed cough 9)  Flexeril 10 Mg Tabs (Cyclobenzaprine hcl) .... One by mouth tid  Other Orders: Admin 1st Vaccine (29562) Flu Vaccine 42yrs + [phone])  Asthma Management Plan    Asthma Severity: Intermittent    Plan based on PEF formula: [place] and Dinah Beers Zone:SYMBICORT 160-4.5 MCG/ACT  AERO  Yellow Zone: PROVENTIL HFA 108 (90 BASE) MCG/ACT AERS  Patient  Instructions: 1)  Please schedule a follow-up appointment in 3 months.

## 2010-11-27 NOTE — Assessment & Plan Note (Signed)
Summary: 2 month rov/njr   Vital Signs:  Patient profile:   65 year old female Height:      62 inches Weight:      156 pounds BMI:     28.64 Temp:     98.2 degrees F oral Pulse rate:   108 / minute Resp:     14 per minute BP sitting:   140 / 80  (left arm)  Vitals Entered By: Willy Eddy, LPN (Mar 04, 2010 4:04 PM) CC: roa, Hypertension Management   CC:  roa and Hypertension Management.  History of Present Illness: the pt has been treated for COPD with chronic cough induced spasm has been on allegra D and the sympicort with control of the PNDrip and the cought as well as the asthma  Hypertension History:      She denies headache, chest pain, palpitations, dyspnea with exertion, orthopnea, PND, peripheral edema, visual symptoms, neurologic problems, syncope, and side effects from treatment.        Positive major cardiovascular risk factors include female age 50 years old or older and hypertension.  Negative major cardiovascular risk factors include non-tobacco-user status.     Preventive Screening-Counseling & Management  Alcohol-Tobacco     Smoking Status: quit     Year Quit: 1989  Problems Prior to Update: 1)  Acute Serous Otitis Media  (ICD-381.01) 2)  Plantar Fasciitis, Right  (ICD-728.71) 3)  Obstructive Chronic Bronchitis With Exacerbation  (ICD-491.21) 4)  Urticaria  (ICD-708.9) 5)  Cough, Chronic  (ICD-786.2) 6)  Anhedonia  (ICD-300.89) 7)  Acute Bronchitis  (ICD-466.0) 8)  Allergic Reaction, Acute  (ICD-995.3) 9)  Shingles  (ICD-053.9) 10)  Uri  (ICD-465.9) 11)  Cervical Disc Disorder  (ICD-722.91) 12)  Low Back Pain  (ICD-724.2) 13)  Degeneration, Lumbar/lumbosacral Disc  (ICD-722.52) 14)  Family History Diabetes 1st Degree Relative  (ICD-V18.0) 15)  Diverticulosis, Colon  (ICD-562.10) 16)  Osteoarthritis  (ICD-715.90) 17)  Hypertension  (ICD-401.9) 18)  Asthma  (ICD-493.90) 19)  Anxiety  (ICD-300.00)  Current Problems (verified): 1)  Acute  Serous Otitis Media  (ICD-381.01) 2)  Plantar Fasciitis, Right  (ICD-728.71) 3)  Obstructive Chronic Bronchitis With Exacerbation  (ICD-491.21) 4)  Urticaria  (ICD-708.9) 5)  Cough, Chronic  (ICD-786.2) 6)  Anhedonia  (ICD-300.89) 7)  Acute Bronchitis  (ICD-466.0) 8)  Allergic Reaction, Acute  (ICD-995.3) 9)  Shingles  (ICD-053.9) 10)  Uri  (ICD-465.9) 11)  Cervical Disc Disorder  (ICD-722.91) 12)  Low Back Pain  (ICD-724.2) 13)  Degeneration, Lumbar/lumbosacral Disc  (ICD-722.52) 14)  Family History Diabetes 1st Degree Relative  (ICD-V18.0) 15)  Diverticulosis, Colon  (ICD-562.10) 16)  Osteoarthritis  (ICD-715.90) 17)  Hypertension  (ICD-401.9) 18)  Asthma  (ICD-493.90) 19)  Anxiety  (ICD-300.00)  Medications Prior to Update: 1)  Alprazolam 0.25 Mg Tabs (Alprazolam) .... As Needed 2)  Exforge 10-160 Mg Tabs (Amlodipine Besylate-Valsartan) .... One By Mouth Daily 3)  Citalopram Hydrobromide 20 Mg Tabs (Citalopram Hydrobromide) .Marland Kitchen.. 1 Once Daily -Changed From Lexapro Due To Insurance 4)  Nadolol 40 Mg Tabs (Nadolol) .... One By Mouth Daily 5)  Symbicort 160-4.5 Mcg/act  Aero (Budesonide-Formoterol Fumarate) .... Two Puf By Mouth Bid 6)  Proventil Hfa 108 (90 Base) Mcg/act Aers (Albuterol Sulfate) .... 2 Puffs Every 4 Hours As Needed Wheezing, Chest Tightness 7)  Wellbutrin Xl 300 Mg Xr24h-Tab (Bupropion Hcl) .... One By Mouth Daily 8)  Calcium 500 Mg Tabs (Calcium Carbonate) .... 2 Once Daily 9)  Fish Oil 500 Mg  Caps (Omega-3 Fatty Acids) .... 3 Once Daily 10)  Benadryl 25 Mg Caps (Diphenhydramine Hcl) .... As Needed 11)  Astepro 0.15 % Soln (Azelastine Hcl) .... Use As Directed 12)  Omnaris 50 Mcg/act Susp (Ciclesonide) .... Two Spray in Nostril Daily 13)  Medrol (Pak) 4 Mg Tabs (Methylprednisolone) .... Take As Directed  ( Generic) 14)  Azithromycin 250 Mg Tabs (Azithromycin) .... Two By Mouth Now Then One By Mouth Daily For 4 Days 15)  Benzonatate 200 Mg Caps (Benzonatate) ....  One By Mouth Q 6 Hour As Needed Cough 16)  Nasotuss 08-04-24 Mg/49ml Liqd (Cod-Phenyleph-Chlorcyclizine) .... Two Tsp By Mouth Q Hs Prn  Current Medications (verified): 1)  Alprazolam 0.25 Mg Tabs (Alprazolam) .... As Needed 2)  Exforge 10-160 Mg Tabs (Amlodipine Besylate-Valsartan) .... One By Mouth Daily 3)  Citalopram Hydrobromide 20 Mg Tabs (Citalopram Hydrobromide) .Marland Kitchen.. 1 Once Daily -Changed From Lexapro Due To Insurance 4)  Nadolol 40 Mg Tabs (Nadolol) .... One By Mouth Daily 5)  Symbicort 160-4.5 Mcg/act  Aero (Budesonide-Formoterol Fumarate) .... Two Puf By Mouth Bid 6)  Proventil Hfa 108 (90 Base) Mcg/act Aers (Albuterol Sulfate) .... 2 Puffs Every 4 Hours As Needed Wheezing, Chest Tightness 7)  Wellbutrin Xl 300 Mg Xr24h-Tab (Bupropion Hcl) .... One By Mouth Daily 8)  Calcium 500 Mg Tabs (Calcium Carbonate) .... 2 Once Daily 9)  Fish Oil 500 Mg Caps (Omega-3 Fatty Acids) .... 3 Once Daily 10)  Benadryl 25 Mg Caps (Diphenhydramine Hcl) .... As Needed 11)  Benzonatate 200 Mg Caps (Benzonatate) .... One By Mouth Q 6 Hour As Needed Cough 12)  Zyrtec-D Allergy & Congestion 5-120 Mg Xr12h-Tab (Cetirizine-Pseudoephedrine) .Marland Kitchen.. 1 Two Times A Day  Allergies (verified): 1)  ! Penicillin V Potassium (Penicillin V Potassium) 2)  ! Cephalosporins 3)  ! Cleocin (Clindamycin Hcl) 4)  Levaquin (Levofloxacin) 5)  Ambien (Zolpidem Tartrate) 6)  Biaxin (Clarithromycin) 7)  Doxycycline Hyclate (Doxycycline Hyclate)  Past History:  Family History: Last updated: 05/17/2007 Family History Diabetes 1st degree relative fther Family History Hypertension Family History of Neurological disorder mother with alztheimenrs  Social History: Last updated: 05/17/2007 Occupation: cleaning Single Former Smoker  Risk Factors: Smoking Status: quit (03/04/2010)  Past medical, surgical, family and social histories (including risk factors) reviewed, and no changes noted (except as noted below).  Past  Medical History: Reviewed history from 10/29/2008 and no changes required. Anxiety Asthma Hypertension Osteoarthritis Diverticulosis, colon Low back pain shingles  Past Surgical History: Reviewed history from 05/17/2007 and no changes required. Denies major surgical history  Family History: Reviewed history from 05/17/2007 and no changes required. Family History Diabetes 1st degree relative fther Family History Hypertension Family History of Neurological disorder mother with alztheimenrs  Social History: Reviewed history from 05/17/2007 and no changes required. Occupation: cleaning Single Former Smoker  Review of Systems  The patient denies anorexia, fever, weight loss, weight gain, vision loss, decreased hearing, hoarseness, chest pain, syncope, dyspnea on exertion, peripheral edema, prolonged cough, headaches, hemoptysis, abdominal pain, melena, hematochezia, severe indigestion/heartburn, hematuria, incontinence, genital sores, muscle weakness, suspicious skin lesions, transient blindness, difficulty walking, depression, unusual weight change, abnormal bleeding, enlarged lymph nodes, angioedema, and breast masses.    Physical Exam  General:  Well-developed,well-nourished,in no acute distress; alert,appropriate and cooperative throughout examination Head:  Normocephalic and atraumatic without obvious abnormalities. No apparent alopecia or balding. Eyes:  injected eyes and tearing Nose:  no external deformity and no nasal discharge.   Mouth:  posterior lymphoid hypertrophy and postnasal drip.  Neck:  No deformities, masses, or tenderness noted. Lungs:  normal respiratory effort, R wheezes, and L wheezes.   Heart:  normal rate and regular rhythm.   Abdomen:  Bowel sounds positive,abdomen soft and non-tender without masses, organomegaly or hernias noted. Msk:  No deformity or scoliosis noted of thoracic or lumbar spine.   Extremities:  No clubbing, cyanosis, edema, or  deformity noted with normal full range of motion of all joints.   Neurologic:  alert & oriented X3.     Impression & Recommendations:  Problem # 1:  COUGH, CHRONIC (ICD-786.2) refil the tesilon perles  Problem # 2:  OBSTRUCTIVE CHRONIC BRONCHITIS WITH EXACERBATION (ICD-491.21) refill inhailed and urge adherance  Problem # 3:  ALLERGIC REACTION, ACUTE (ICD-995.3) to legs with atopic changes  Problem # 4:  HYPERTENSION (ICD-401.9)  Her updated medication list for this problem includes:    Exforge 10-160 Mg Tabs (Amlodipine besylate-valsartan) ..... One by mouth daily    Nadolol 40 Mg Tabs (Nadolol) ..... One by mouth daily  BP today: 140/80 Prior BP: 160/80 (01/06/2010)  Prior 10 Yr Risk Heart Disease: Not enough information (10/10/2007)  Labs Reviewed: K+: 4.5 (03/04/2009) Creat: : 1.0 (03/04/2009)   Chol: 182 (02/28/2007)   HDL: 30.5 (02/28/2007)   LDL: DEL (02/28/2007)   TG: 217 (02/28/2007)  Problem # 5:  CERVICAL DISC DISORDER (ICD-722.91) has noted weakness in the right hand  Problem # 6:  ANXIETY (ICD-300.00)  Her updated medication list for this problem includes:    Alprazolam 0.25 Mg Tabs (Alprazolam) .Marland Kitchen... As needed    Citalopram Hydrobromide 20 Mg Tabs (Citalopram hydrobromide) .Marland Kitchen... 1 once daily -changed from lexapro due to insurance    Wellbutrin Xl 300 Mg Xr24h-tab (Bupropion hcl) ..... One by mouth daily  Complete Medication List: 1)  Alprazolam 0.25 Mg Tabs (Alprazolam) .... As needed 2)  Exforge 10-160 Mg Tabs (Amlodipine besylate-valsartan) .... One by mouth daily 3)  Citalopram Hydrobromide 20 Mg Tabs (Citalopram hydrobromide) .Marland Kitchen.. 1 once daily -changed from lexapro due to insurance 4)  Nadolol 40 Mg Tabs (Nadolol) .... One by mouth daily 5)  Symbicort 160-4.5 Mcg/act Aero (Budesonide-formoterol fumarate) .... Two puf by mouth bid 6)  Proventil Hfa 108 (90 Base) Mcg/act Aers (Albuterol sulfate) .... 2 puffs every 4 hours as needed wheezing, chest  tightness 7)  Wellbutrin Xl 300 Mg Xr24h-tab (Bupropion hcl) .... One by mouth daily 8)  Calcium 500 Mg Tabs (Calcium carbonate) .... 2 once daily 9)  Fish Oil 500 Mg Caps (Omega-3 fatty acids) .... 3 once daily 10)  Benadryl 25 Mg Caps (Diphenhydramine hcl) .... As needed 11)  Benzonatate 200 Mg Caps (Benzonatate) .... One by mouth q 6 hour as needed cough 12)  Zyrtec-d Allergy & Congestion 5-120 Mg Xr12h-tab (Cetirizine-pseudoephedrine) .Marland Kitchen.. 1 two times a day  Hypertension Assessment/Plan:      The patient's hypertensive risk group is category B: At least one risk factor (excluding diabetes) with no target organ damage.  Today's blood pressure is 140/80.  Her blood pressure goal is < 140/90.  Patient Instructions: 1)  stop the welbutrin first then after two week cut the lexapro in 1/2 the after two more weeks stop 2)  Please schedule a follow-up appointment in 2 months. Prescriptions: BENZONATATE 200 MG CAPS (BENZONATATE) one by mouth q 6 hour as needed cough  #30 x 2   Entered and Authorized by:   [name] MD   Signed by:   [name] MD on  03/04/2010   Method used:   Print then Give to Patient   RxID:   [id number] BENZONATATE 200 MG CAPS (BENZONATATE) one by mouth q 6 hour as needed cough  #30 x 1   Entered by:   Willy Eddy, LPN   Authorized by:   [name] MD   Signed by:   Willy Eddy, LPN on 14/78/2956   Method used:   Print then Give to Patient   RxID:   [id number] EXFORGE 10-160 MG TABS (AMLODIPINE BESYLATE-VALSARTAN) one by mouth daily  #0 x 0   Entered by:   Willy Eddy, LPN   Authorized by:   [name] MD   Signed by:   Willy Eddy, LPN on 28/41/3244   Method used:   Print then Give to Patient   RxID:   [id number] SYMBICORT 160-4.5 MCG/ACT  AERO (BUDESONIDE-FORMOTEROL FUMARATE) two puf by mouth bid  #1 unit x 11   Entered by:   Willy Eddy, LPN   Authorized by:   [name] MD   Signed by:   Willy Eddy, LPN on 42/59/5638   Method used:   Print then Give to Patient   RxID:   [id number] ALPRAZOLAM 0.25 MG TABS (ALPRAZOLAM) as needed  #60 x 3   Entered by:   Willy Eddy, LPN   Authorized by:   [name] MD   Signed by:   Willy Eddy, LPN on 04/24/1600   Method used:   Print then Give to Patient   RxID:   [id number]

## 2010-11-27 NOTE — Assessment & Plan Note (Signed)
Summary: 3 mo rov/mm per Kendal Hymen   Vital Signs:  Patient profile:   65 year old female Height:      62 inches Weight:      141 pounds BMI:     25.88 Temp:     98.2 degrees F oral Pulse rate:   72 / minute Resp:     14 per minute BP sitting:   140 / 80  (left arm)  Vitals Entered By: Willy Eddy, LPN (October 31, 2010 4:34 PM) CC: roa Is Patient Diabetic? No   Primary Care Provider:  [name] MD  CC:  roa.  History of Present Illness: hx of perforated ulcer with early sataity and  mild nausea. The pt has not  had recent dark stools. Has mild nauea. has persistnat back pain and has been using increased amounts of NSAIDS    Preventive Screening-Counseling & Management  Alcohol-Tobacco     Smoking Status: quit     Year Quit: 1989     Tobacco Counseling: not indicated; no tobacco use  Problems Prior to Update: 1)  Peptic Ulcer Disease  (ICD-533.90) 2)  Early Satiety  (ICD-789.9) 3)  Acute Serous Otitis Media  (ICD-381.01) 4)  Plantar Fasciitis, Right  (ICD-728.71) 5)  Obstructive Chronic Bronchitis With Exacerbation  (ICD-491.21) 6)  Urticaria  (ICD-708.9) 7)  Cough, Chronic  (ICD-786.2) 8)  Anhedonia  (ICD-300.89) 9)  Acute Bronchitis  (ICD-466.0) 10)  Allergic Reaction, Acute  (ICD-995.3) 11)  Shingles  (ICD-053.9) 12)  Uri  (ICD-465.9) 13)  Cervical Disc Disorder  (ICD-722.91) 14)  Low Back Pain  (ICD-724.2) 15)  Degeneration, Lumbar/lumbosacral Disc  (ICD-722.52) 16)  Family History Diabetes 1st Degree Relative  (ICD-V18.0) 17)  Diverticulosis, Colon  (ICD-562.10) 18)  Osteoarthritis  (ICD-715.90) 19)  Hypertension  (ICD-401.9) 20)  Asthma  (ICD-493.90) 21)  Anxiety  (ICD-300.00)  Current Problems (verified): 1)  Acute Serous Otitis Media  (ICD-381.01) 2)  Plantar Fasciitis, Right  (ICD-728.71) 3)  Obstructive Chronic Bronchitis With Exacerbation  (ICD-491.21) 4)  Urticaria  (ICD-708.9) 5)  Cough, Chronic  (ICD-786.2) 6)  Anhedonia   (ICD-300.89) 7)  Acute Bronchitis  (ICD-466.0) 8)  Allergic Reaction, Acute  (ICD-995.3) 9)  Shingles  (ICD-053.9) 10)  Uri  (ICD-465.9) 11)  Cervical Disc Disorder  (ICD-722.91) 12)  Low Back Pain  (ICD-724.2) 13)  Degeneration, Lumbar/lumbosacral Disc  (ICD-722.52) 14)  Family History Diabetes 1st Degree Relative  (ICD-V18.0) 15)  Diverticulosis, Colon  (ICD-562.10) 16)  Osteoarthritis  (ICD-715.90) 17)  Hypertension  (ICD-401.9) 18)  Asthma  (ICD-493.90) 19)  Anxiety  (ICD-300.00)  Medications Prior to Update: 1)  Alprazolam 0.25 Mg Tabs (Alprazolam) .... As Needed 2)  Exforge 10-160 Mg Tabs (Amlodipine Besylate-Valsartan) .... One By Mouth Daily 3)  Nadolol 40 Mg Tabs (Nadolol) .... One By Mouth Daily 4)  Symbicort 160-4.5 Mcg/act  Aero (Budesonide-Formoterol Fumarate) .... Two Puf By Mouth Bid 5)  Proventil Hfa 108 (90 Base) Mcg/act Aers (Albuterol Sulfate) .... 2 Puffs Every 4 Hours As Needed Wheezing, Chest Tightness 6)  Calcium 500 Mg Tabs (Calcium Carbonate) .... 2 Once Daily 7)  Fish Oil 500 Mg Caps (Omega-3 Fatty Acids) .... 3 Once Daily 8)  Benzonatate 200 Mg Caps (Benzonatate) .... One By Mouth Q 6 Hour As Needed Cough 9)  Flexeril 10 Mg Tabs (Cyclobenzaprine Hcl) .... One By Mouth Tid  Current Medications (verified): 1)  Alprazolam 0.25 Mg Tabs (Alprazolam) .... As Needed 2)  Exforge 10-160 Mg Tabs (Amlodipine  Besylate-Valsartan) .... One By Mouth Daily 3)  Nadolol 40 Mg Tabs (Nadolol) .... One By Mouth Daily 4)  Symbicort 160-4.5 Mcg/act  Aero (Budesonide-Formoterol Fumarate) .... Two Puf By Mouth Bid 5)  Proventil Hfa 108 (90 Base) Mcg/act Aers (Albuterol Sulfate) .... 2 Puffs Every 4 Hours As Needed Wheezing, Chest Tightness 6)  Calcium 500 Mg Tabs (Calcium Carbonate) .... 2 Once Daily 7)  Fish Oil 500 Mg Caps (Omega-3 Fatty Acids) .... 3 Once Daily  Allergies (verified): 1)  ! Penicillin V Potassium (Penicillin V Potassium) 2)  ! Cephalosporins 3)  ! Cleocin  (Clindamycin Hcl) 4)  Levaquin (Levofloxacin) 5)  Ambien (Zolpidem Tartrate) 6)  Biaxin (Clarithromycin) 7)  Doxycycline Hyclate (Doxycycline Hyclate)  Past History:  Family History: Last updated: 05/17/2007 Family History Diabetes 1st degree relative fther Family History Hypertension Family History of Neurological disorder mother with alztheimenrs  Social History: Last updated: 05/17/2007 Occupation: cleaning Single Former Smoker  Risk Factors: Smoking Status: quit (10/31/2010)  Past medical, surgical, family and social histories (including risk factors) reviewed, and no changes noted (except as noted below).  Past Medical History: Reviewed history from 10/29/2008 and no changes required. Anxiety Asthma Hypertension Osteoarthritis Diverticulosis, colon Low back pain shingles  Past Surgical History: Reviewed history from 05/17/2007 and no changes required. Denies major surgical history  Family History: Reviewed history from 05/17/2007 and no changes required. Family History Diabetes 1st degree relative fther Family History Hypertension Family History of Neurological disorder mother with alztheimenrs  Social History: Reviewed history from 05/17/2007 and no changes required. Occupation: cleaning Single Former Smoker  Review of Systems  The patient denies anorexia, fever, weight loss, weight gain, vision loss, decreased hearing, hoarseness, chest pain, syncope, dyspnea on exertion, peripheral edema, prolonged cough, headaches, hemoptysis, abdominal pain, melena, hematochezia, severe indigestion/heartburn, hematuria, incontinence, genital sores, muscle weakness, suspicious skin lesions, transient blindness, difficulty walking, depression, unusual weight change, abnormal bleeding, enlarged lymph nodes, angioedema, and breast masses.    Physical Exam  General:  Well-developed,well-nourished,in no acute distress; alert,appropriate and cooperative throughout  examination Head:  Normocephalic and atraumatic without obvious abnormalities. No apparent alopecia or balding. Eyes:  injected eyes and tearing Ears:  R ear normal and L ear normal.   Nose:  no external deformity and no nasal discharge.   Mouth:  posterior lymphoid hypertrophy and postnasal drip.   Neck:  No deformities, masses, or tenderness noted. Lungs:  normal respiratory effort, R wheezes, and L wheezes.   Heart:  normal rate and regular rhythm.   Abdomen:  Bowel sounds positive,abdomen soft and non-tender without masses, organomegaly or hernias noted.   Impression & Recommendations:  Problem # 1:  LOW BACK PAIN (ICD-724.2) Assessment Unchanged  The following medications were removed from the medication list:    Flexeril 10 Mg Tabs (Cyclobenzaprine hcl) ..... One by mouth tid  Discussed use of moist heat or ice, modified activities, medications, and stretching/strengthening exercises. Back care instructions given. To be seen in 2 weeks if no improvement; sooner if worsening of symptoms.   Problem # 2:  HYPERTENSION (ICD-401.9) Assessment: Unchanged  Her updated medication list for this problem includes:    Exforge 10-160 Mg Tabs (Amlodipine besylate-valsartan) ..... One by mouth daily    Nadolol 40 Mg Tabs (Nadolol) ..... One by mouth daily  BP today: 140/80 Prior BP: 130/80 (07/30/2010)  Prior 10 Yr Risk Heart Disease: Not enough information (10/10/2007)  Labs Reviewed: K+: 4.5 (03/04/2009) Creat: : 1.0 (03/04/2009)   Chol: 182 (02/28/2007)  HDL: 30.5 (02/28/2007)   LDL: DEL (02/28/2007)   TG: 217 (02/28/2007)  Problem # 3:  OSTEOARTHRITIS (ICD-715.90) the pt has swollen tender MCPs Discussed use of medications, application of heat or cold, and exercises.   Problem # 4:  EARLY SATIETY (ICD-789.9) with moderate nausea possible PUD will try nexium if fails  EGD needed ( [facility]) hx of perferated ulcer  Problem # 5:  PEPTIC ULCER DISEASE (ICD-533.90) nexium  trial and if pain does not stop refer for EGD if any dark stool, or if any hematemysis Labs Reviewed: Hgb: 13.3 (03/04/2009)   Hct: 39.4 (03/04/2009)     Complete Medication List: 1)  Alprazolam 0.25 Mg Tabs (Alprazolam) .... As needed 2)  Exforge 10-160 Mg Tabs (Amlodipine besylate-valsartan) .... One by mouth daily 3)  Nadolol 40 Mg Tabs (Nadolol) .... One by mouth daily 4)  Symbicort 160-4.5 Mcg/act Aero (Budesonide-formoterol fumarate) .... Two puf by mouth bid 5)  Proventil Hfa 108 (90 Base) Mcg/act Aers (Albuterol sulfate) .... 2 puffs every 4 hours as needed wheezing, chest tightness 6)  Calcium 500 Mg Tabs (Calcium carbonate) .... 2 once daily 7)  Fish Oil 500 Mg Caps (Omega-3 fatty acids) .... 3 once daily  Patient Instructions: 1)  Please schedule a follow-up appointment in 3 months.   Orders Added: 1)  Est. Patient Level IV [16109]

## 2010-11-27 NOTE — Assessment & Plan Note (Signed)
Summary: 2 month rov/njr   Vital Signs:  Patient profile:   65 year old female Height:      62 inches Weight:      146 pounds BMI:     26.80 Temp:     98.2 degrees F oral Pulse rate:   88 / minute Resp:     14 per minute BP sitting:   150 / 80  (left arm)  Vitals Entered By: Willy Eddy, LPN (May 07, 2010 4:47 PM) CC: roa- feels much better and coughing has subsided since stopping antidepressants--, Hypertension Management   CC:  roa- feels much better and coughing has subsided since stopping antidepressants-- and Hypertension Management.  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        Positive major cardiovascular risk factors include female age 92 years old or older and hypertension.  Negative major cardiovascular risk factors include non-tobacco-user status.      Preventive Screening-Counseling & Management  Alcohol-Tobacco     Smoking Status: quit     Year Quit: 1989  Problems Prior to Update: 1)  Acute Serous Otitis Media  (ICD-381.01) 2)  Plantar Fasciitis, Right  (ICD-728.71) 3)  Obstructive Chronic Bronchitis With Exacerbation  (ICD-491.21) 4)  Urticaria  (ICD-708.9) 5)  Cough, Chronic  (ICD-786.2) 6)  Anhedonia  (ICD-300.89) 7)  Acute Bronchitis  (ICD-466.0) 8)  Allergic Reaction, Acute  (ICD-995.3) 9)  Shingles  (ICD-053.9) 10)  Uri  (ICD-465.9) 11)  Cervical Disc Disorder  (ICD-722.91) 12)  Low Back Pain  (ICD-724.2) 13)  Degeneration, Lumbar/lumbosacral Disc   (ICD-722.52) 14)  Family History Diabetes 1st Degree Relative  (ICD-V18.0) 15)  Diverticulosis, Colon  (ICD-562.10) 16)  Osteoarthritis  (ICD-715.90) 17)  Hypertension  (ICD-401.9) 18)  Asthma  (ICD-493.90) 19)  Anxiety  (ICD-300.00)  Current Problems (verified): 1)  Acute Serous Otitis Media  (ICD-381.01) 2)  Plantar Fasciitis, Right  (ICD-728.71) 3)  Obstructive Chronic Bronchitis With Exacerbation  (ICD-491.21) 4)  Urticaria  (ICD-708.9) 5)  Cough, Chronic  (ICD-786.2) 6)  Anhedonia  (ICD-300.89) 7)  Acute Bronchitis  (ICD-466.0) 8)  Allergic Reaction, Acute  (ICD-995.3) 9)  Shingles  (ICD-053.9) 10)  Uri  (ICD-465.9) 11)  Cervical Disc Disorder  (ICD-722.91) 12)  Low Back Pain  (ICD-724.2) 13)  Degeneration, Lumbar/lumbosacral Disc  (ICD-722.52) 14)  Family History Diabetes 1st Degree Relative  (ICD-V18.0) 15)  Diverticulosis, Colon  (ICD-562.10) 16)  Osteoarthritis  (ICD-715.90) 17)  Hypertension  (ICD-401.9) 18)  Asthma  (ICD-493.90) 19)  Anxiety  (ICD-300.00)  Medications Prior to Update: 1)  Alprazolam 0.25 Mg Tabs (Alprazolam) .... As Needed 2)  Exforge 10-160 Mg Tabs (Amlodipine Besylate-Valsartan) .... One By Mouth Daily 3)  Citalopram Hydrobromide 20 Mg Tabs (Citalopram Hydrobromide) .Marland Kitchen.. 1 Once Daily -Changed From Lexapro Due To Insurance 4)  Nadolol 40 Mg Tabs (Nadolol) .... One By Mouth Daily 5)  Symbicort 160-4.5 Mcg/act  Aero (Budesonide-Formoterol Fumarate) .... Two Puf By Mouth Bid 6)  Proventil Hfa 108 (90 Base) Mcg/act Aers (Albuterol Sulfate) .... 2 Puffs Every 4 Hours As  Needed Wheezing, Chest Tightness 7)  Wellbutrin Xl 300 Mg Xr24h-Tab (Bupropion Hcl) .... One By Mouth Daily 8)  Calcium 500 Mg Tabs (Calcium Carbonate) .... 2 Once Daily 9)  Fish Oil 500 Mg Caps (Omega-3 Fatty Acids) .... 3 Once Daily 10)  Benadryl 25 Mg Caps (Diphenhydramine Hcl) .... As Needed 11)  Benzonatate 200 Mg Caps (Benzonatate) .... One By Mouth Q 6 Hour As Needed Cough 12)   Zyrtec-D Allergy & Congestion 5-120 Mg Xr12h-Tab (Cetirizine-Pseudoephedrine) .Marland Kitchen.. 1 Two Times A Day  Current Medications (verified): 1)  Alprazolam 0.25 Mg Tabs (Alprazolam) .... As Needed 2)  Exforge 10-160 Mg Tabs (Amlodipine Besylate-Valsartan) .... One By Mouth Daily 3)  Nadolol 40 Mg Tabs (Nadolol) .... One By Mouth Daily 4)  Symbicort 160-4.5 Mcg/act  Aero (Budesonide-Formoterol Fumarate) .... Two Puf By Mouth Bid 5)  Proventil Hfa 108 (90 Base) Mcg/act Aers (Albuterol Sulfate) .... 2 Puffs Every 4 Hours As Needed Wheezing, Chest Tightness 6)  Calcium 500 Mg Tabs (Calcium Carbonate) .... 2 Once Daily 7)  Fish Oil 500 Mg Caps (Omega-3 Fatty Acids) .... 3 Once Daily 8)  Benzonatate 200 Mg Caps (Benzonatate) .... One By Mouth Q 6 Hour As Needed Cough  Allergies (verified): 1)  ! Penicillin V Potassium (Penicillin V Potassium) 2)  ! Cephalosporins 3)  ! Cleocin (Clindamycin Hcl) 4)  Levaquin (Levofloxacin) 5)  Ambien (Zolpidem Tartrate) 6)  Biaxin (Clarithromycin) 7)  Doxycycline Hyclate (Doxycycline Hyclate)  Past History:  Family History: Last updated: 05/17/2007 Family History Diabetes 1st degree relative fther Family History Hypertension Family History of Neurological disorder mother with alztheimenrs  Social History: Last updated: 05/17/2007 Occupation: cleaning Single Former Smoker  Risk Factors: Smoking Status: quit (05/07/2010)  Past medical, surgical, family and social histories (including risk factors) reviewed, and no changes noted (except as noted below).  Past Medical History: Reviewed history from 10/29/2008 and no changes required. Anxiety Asthma Hypertension Osteoarthritis Diverticulosis, colon Low back pain shingles  Past Surgical History: Reviewed history from 05/17/2007 and no changes required. Denies major surgical history  Family History: Reviewed history from 05/17/2007 and no changes required. Family History Diabetes 1st degree  relative fther Family History Hypertension Family History of Neurological disorder mother with alztheimenrs  Social History: Reviewed history from 05/17/2007 and no changes required. Occupation: cleaning Single Former Smoker  Review of Systems  The patient denies anorexia, fever, weight loss, weight gain, vision loss, decreased hearing, hoarseness, chest pain, syncope, dyspnea on exertion, peripheral edema, prolonged cough, headaches, hemoptysis, abdominal pain, melena, hematochezia, severe indigestion/heartburn, hematuria, incontinence, genital sores, muscle weakness, suspicious skin lesions, transient blindness, difficulty walking, depression, unusual weight change, abnormal bleeding, enlarged lymph nodes, angioedema, breast masses, and testicular masses.    Physical Exam  General:  Well-developed,well-nourished,in no acute distress; alert,appropriate and cooperative throughout examination Head:  Normocephalic and atraumatic without obvious abnormalities. No apparent alopecia or balding. Eyes:  injected eyes and tearing Ears:  R ear normal and L ear normal.   Nose:  no external deformity and no nasal discharge.   Mouth:  posterior lymphoid hypertrophy and postnasal drip.   Neck:  No deformities, masses, or tenderness noted. Lungs:  normal respiratory effort, R wheezes, and L wheezes.   Heart:  normal rate and regular rhythm.   Abdomen:  Bowel sounds positive,abdomen soft and non-tender without masses, organomegaly or hernias noted. Msk:  No deformity or scoliosis noted of thoracic or lumbar spine.   Pulses:  R and L carotid,radial,femoral,dorsalis pedis and posterior tibial pulses are  full and equal bilaterally Neurologic:  alert & oriented X3.     Impression & Recommendations:  Problem # 1:  HYPERTENSION (ICD-401.9)  Her updated medication list for this problem includes:    Exforge 10-160 Mg Tabs (Amlodipine besylate-valsartan) ..... One by mouth daily    Nadolol 40 Mg Tabs  (Nadolol) ..... One by mouth daily  BP today: 150/80 Prior BP: 140/80 (03/04/2010)  Prior 10 Yr Risk Heart Disease: Not enough information (10/10/2007)  Labs Reviewed: K+: 4.5 (03/04/2009) Creat: : 1.0 (03/04/2009)   Chol: 182 (02/28/2007)   HDL: 30.5 (02/28/2007)   LDL: DEL (02/28/2007)   TG: 217 (02/28/2007)  Problem # 2:  ASTHMA (ICD-493.90)  Her updated medication list for this problem includes:    Symbicort 160-4.5 Mcg/act Aero (Budesonide-formoterol fumarate) .Marland Kitchen..Marland Kitchen Two puf by mouth bid    Proventil Hfa 108 (90 Base) Mcg/act Aers (Albuterol sulfate) .Marland Kitchen... 2 puffs every 4 hours as needed wheezing, chest tightness  Pulmonary Functions Reviewed: O2 sat: 98 (01/06/2010)  Current Asthma Management Plan:  Green Zone:  SYMBICORT 160-4.5 MCG/ACT  AERO Yellow Zone:  PROVENTIL HFA 108 (90 BASE) MCG/ACT AERS  Problem # 3:  ANXIETY (ICD-300.00) Assessment: Improved improved The following medications were removed from the medication list:    Citalopram Hydrobromide 20 Mg Tabs (Citalopram hydrobromide) .Marland Kitchen... 1 once daily -changed from lexapro due to insurance    Wellbutrin Xl 300 Mg Xr24h-tab (Bupropion hcl) ..... One by mouth daily Her updated medication list for this problem includes:    Alprazolam 0.25 Mg Tabs (Alprazolam) .Marland Kitchen... As needed  Problem # 4:  LOW BACK PAIN (ICD-724.2)  Discussed use of moist heat or ice, modified activities, medications, and stretching/strengthening exercises. Back care instructions given. To be seen in 2 weeks if no improvement; sooner if worsening of symptoms.   Complete Medication List: 1)  Alprazolam 0.25 Mg Tabs (Alprazolam) .... As needed 2)  Exforge 10-160 Mg Tabs (Amlodipine besylate-valsartan) .... One by mouth daily 3)  Nadolol 40 Mg Tabs (Nadolol) .... One by mouth daily 4)  Symbicort 160-4.5 Mcg/act Aero (Budesonide-formoterol fumarate) .... Two puf by mouth bid 5)  Proventil Hfa 108 (90 Base) Mcg/act Aers (Albuterol sulfate) .... 2  puffs every 4 hours as needed wheezing, chest tightness 6)  Calcium 500 Mg Tabs (Calcium carbonate) .... 2 once daily 7)  Fish Oil 500 Mg Caps (Omega-3 fatty acids) .... 3 once daily 8)  Benzonatate 200 Mg Caps (Benzonatate) .... One by mouth q 6 hour as needed cough  Asthma Management Plan    Plan based on PEF formula: [place] [employer] [place] [employer] Zone:SYMBICORT 160-4.5 MCG/ACT  AERO  Yellow Zone: PROVENTIL HFA 108 (90 BASE) MCG/ACT AERS  Hypertension Assessment/Plan:      The patient's hypertensive risk group is category B: At least one risk factor (excluding diabetes) with no target organ damage.  Today's blood pressure is 150/80.  Her blood pressure goal is < 140/90.  Patient Instructions: 1)  Please schedule a follow-up appointment in 3 months.

## 2010-11-27 NOTE — Assessment & Plan Note (Signed)
Summary: 6 WK ROV/NJR   Vital Signs:  Patient profile:   65 year old female Height:      62 inches Weight:      158 pounds BMI:     29.00 O2 Sat:      98 % on Room air Temp:     98.3 degrees F oral Pulse rate:   76 / minute Resp:     16 per minute BP sitting:   160 / 80  (left arm)  Vitals Entered By: Willy Eddy, LPN (January 06, 2010 4:34 PM)  Nutrition Counseling: Patient's BMI is greater than 25 and therefore counseled on weight management options.  O2 Flow:  Room air CC: roa- c/o cough with sob, which she states has gotten worse in the last 24 hours- pt states wheezing is in her throat and nose and not in her lungs , Hypertension Management   CC:  roa- c/o cough with sob, which she states has gotten worse in the last 24 hours- pt states wheezing is in her throat and nose and not in her lungs , and Hypertension Management.  History of Present Illness: falir of bronchtis/ asthma with allergy flair wheezing and cough sats are 98% coughing with post cough bronchospasm hx of asthma hx of bronchitis   Hypertension History:      She denies headache, chest pain, palpitations, dyspnea with exertion, orthopnea, PND, peripheral edema, visual symptoms, neurologic problems, syncope, and side effects from treatment.        Positive major cardiovascular risk factors include female age 69 years old or older and hypertension.  Negative major cardiovascular risk factors include non-tobacco-user status.     Preventive Screening-Counseling & Management  Alcohol-Tobacco     Smoking Status: quit     Year Quit: 1989  Problems Prior to Update: 1)  Acute Serous Otitis Media  (ICD-381.01) 2)  Plantar Fasciitis, Right  (ICD-728.71) 3)  Obstructive Chronic Bronchitis With Exacerbation  (ICD-491.21) 4)  Urticaria  (ICD-708.9) 5)  Cough, Chronic  (ICD-786.2) 6)  Anhedonia  (ICD-300.89) 7)  Acute Bronchitis  (ICD-466.0) 8)  Allergic Reaction, Acute  (ICD-995.3) 9)  Shingles   (ICD-053.9) 10)  Uri  (ICD-465.9) 11)  Cervical Disc Disorder  (ICD-722.91) 12)  Low Back Pain  (ICD-724.2) 13)  Degeneration, Lumbar/lumbosacral Disc  (ICD-722.52) 14)  Family History Diabetes 1st Degree Relative  (ICD-V18.0) 15)  Diverticulosis, Colon  (ICD-562.10) 16)  Osteoarthritis  (ICD-715.90) 17)  Hypertension  (ICD-401.9) 18)  Asthma  (ICD-493.90) 19)  Anxiety  (ICD-300.00)  Current Problems (verified): 1)  Acute Serous Otitis Media  (ICD-381.01) 2)  Plantar Fasciitis, Right  (ICD-728.71) 3)  Obstructive Chronic Bronchitis With Exacerbation  (ICD-491.21) 4)  Urticaria  (ICD-708.9) 5)  Cough, Chronic  (ICD-786.2) 6)  Anhedonia  (ICD-300.89) 7)  Acute Bronchitis  (ICD-466.0) 8)  Allergic Reaction, Acute  (ICD-995.3) 9)  Shingles  (ICD-053.9) 10)  Uri  (ICD-465.9) 11)  Cervical Disc Disorder  (ICD-722.91) 12)  Low Back Pain  (ICD-724.2) 13)  Degeneration, Lumbar/lumbosacral Disc  (ICD-722.52) 14)  Family History Diabetes 1st Degree Relative  (ICD-V18.0) 15)  Diverticulosis, Colon  (ICD-562.10) 16)  Osteoarthritis  (ICD-715.90) 17)  Hypertension  (ICD-401.9) 18)  Asthma  (ICD-493.90) 19)  Anxiety  (ICD-300.00)  Medications Prior to Update: 1)  Alprazolam 0.25 Mg Tabs (Alprazolam) .... As Needed 2)  Exforge 10-160 Mg Tabs (Amlodipine Besylate-Valsartan) .... One By Mouth Daily 3)  Citalopram Hydrobromide 20 Mg Tabs (Citalopram Hydrobromide) .Marland Kitchen.. 1 Once Daily -Changed  From Lexapro Due To Insurance 4)  Nadolol 40 Mg Tabs (Nadolol) .... One By Mouth Daily 5)  Symbicort 160-4.5 Mcg/act  Aero (Budesonide-Formoterol Fumarate) .... Two Puf By Mouth Bid 6)  Proventil Hfa 108 (90 Base) Mcg/act Aers (Albuterol Sulfate) .... 2 Puffs Every 4 Hours As Needed Wheezing, Chest Tightness 7)  Wellbutrin Xl 300 Mg Xr24h-Tab (Bupropion Hcl) .... One By Mouth Daily 8)  Calcium 500 Mg Tabs (Calcium Carbonate) .... 2 Once Daily 9)  Fish Oil 500 Mg Caps (Omega-3 Fatty Acids) .... 3 Once  Daily 10)  Benadryl 25 Mg Caps (Diphenhydramine Hcl) .... As Needed 11)  Astepro 0.15 % Soln (Azelastine Hcl) .... Use As Directed 12)  Omnaris 50 Mcg/act Susp (Ciclesonide) .... Two Spray in Nostril Daily  Current Medications (verified): 1)  Alprazolam 0.25 Mg Tabs (Alprazolam) .... As Needed 2)  Exforge 10-160 Mg Tabs (Amlodipine Besylate-Valsartan) .... One By Mouth Daily 3)  Citalopram Hydrobromide 20 Mg Tabs (Citalopram Hydrobromide) .Marland Kitchen.. 1 Once Daily -Changed From Lexapro Due To Insurance 4)  Nadolol 40 Mg Tabs (Nadolol) .... One By Mouth Daily 5)  Symbicort 160-4.5 Mcg/act  Aero (Budesonide-Formoterol Fumarate) .... Two Puf By Mouth Bid 6)  Proventil Hfa 108 (90 Base) Mcg/act Aers (Albuterol Sulfate) .... 2 Puffs Every 4 Hours As Needed Wheezing, Chest Tightness 7)  Wellbutrin Xl 300 Mg Xr24h-Tab (Bupropion Hcl) .... One By Mouth Daily 8)  Calcium 500 Mg Tabs (Calcium Carbonate) .... 2 Once Daily 9)  Fish Oil 500 Mg Caps (Omega-3 Fatty Acids) .... 3 Once Daily 10)  Benadryl 25 Mg Caps (Diphenhydramine Hcl) .... As Needed 11)  Astepro 0.15 % Soln (Azelastine Hcl) .... Use As Directed 12)  Omnaris 50 Mcg/act Susp (Ciclesonide) .... Two Spray in Nostril Daily 13)  Medrol (Pak) 4 Mg Tabs (Methylprednisolone) .... Take As Directed  ( Generic) 14)  Azithromycin 250 Mg Tabs (Azithromycin) .... Two By Mouth Now Then One By Mouth Daily For 4 Days 15)  Benzonatate 200 Mg Caps (Benzonatate) .... One By Mouth Q 6 Hour As Needed Cough 16)  Nasotuss 08-04-24 Mg/39ml Liqd (Cod-Phenyleph-Chlorcyclizine) .... Two Tsp By Mouth Q Hs Prn  Allergies (verified): 1)  ! Penicillin V Potassium (Penicillin V Potassium) 2)  ! Cephalosporins 3)  ! 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  Past Medical History: Reviewed history from 10/29/2008 and no changes required. Anxiety Asthma Hypertension Osteoarthritis Diverticulosis, colon Low back pain shingles  Past Surgical History: Reviewed history from 05/17/2007 and no changes required. Denies major surgical history  Family History: Reviewed history from 05/17/2007 and no changes required. Family History Diabetes 1st degree relative fther Family History Hypertension Family History of Neurological disorder mother with alztheimenrs  Social History: Reviewed history from 05/17/2007 and no changes required. Occupation: cleaning Single Former Smoker  Review of Systems  The patient denies anorexia, fever, weight loss, weight gain, vision loss, decreased hearing, hoarseness, chest pain, syncope, dyspnea on exertion, peripheral edema, prolonged cough, headaches, hemoptysis, abdominal pain, melena, hematochezia, severe indigestion/heartburn, hematuria, incontinence, genital sores,  muscle weakness, suspicious skin lesions, transient blindness, difficulty walking, depression, unusual weight change, abnormal bleeding, enlarged lymph nodes, angioedema, and breast masses.    Physical Exam  General:  Well-developed,well-nourished,in no acute distress; alert,appropriate and cooperative throughout examination Head:  Normocephalic and atraumatic without obvious abnormalities. No apparent alopecia or balding. Eyes:  injected eyes and tearing Ears:  R ear normal and L ear normal.   Nose:  no external deformity and no nasal discharge.   Mouth:  posterior lymphoid  hypertrophy and postnasal drip.   Neck:  No deformities, masses, or tenderness noted. Lungs:  normal respiratory effort, R wheezes, and L wheezes.   Heart:  normal rate and regular rhythm.   Abdomen:  Bowel sounds positive,abdomen soft and non-tender without masses, organomegaly or hernias noted. Msk:  No deformity or scoliosis noted of thoracic or lumbar spine.   Pulses:  R and L carotid,radial,femoral,dorsalis pedis and posterior tibial pulses are full and equal bilaterally Extremities:  No clubbing, cyanosis, edema, or deformity noted with normal full range of motion of all joints.   Neurologic:  alert & oriented X3.     Impression & Recommendations:  Problem # 1:  COUGH, CHRONIC (ICD-786.2) Assessment Deteriorated tussionex  Problem # 2:  OBSTRUCTIVE CHRONIC BRONCHITIS WITH EXACERBATION (ICD-491.21) Assessment: Deteriorated zithromicin and cough meds and medrol dose pack  Problem # 3:  HYPERTENSION (ICD-401.9)  Her updated medication list for this problem includes:    Exforge 10-160 Mg Tabs (Amlodipine besylate-valsartan) ..... One by mouth daily    Nadolol 40 Mg Tabs (Nadolol) ..... One by mouth daily  BP today: 160/80 Prior BP: 180/90 (11/25/2009)  Prior 10 Yr Risk Heart Disease: Not enough information (10/10/2007)  Labs Reviewed: K+: 4.5 (03/04/2009) Creat: : 1.0 (03/04/2009)   Chol: 182 (02/28/2007)   HDL: 30.5 (02/28/2007)   LDL: DEL (02/28/2007)   TG: 217 (02/28/2007)  Complete Medication List: 1)  Alprazolam 0.25 Mg Tabs (Alprazolam) .... As needed 2)  Exforge 10-160 Mg Tabs (Amlodipine besylate-valsartan) .... One by mouth daily 3)  Citalopram Hydrobromide 20 Mg Tabs (Citalopram hydrobromide) .Marland Kitchen.. 1 once daily -changed from lexapro due to insurance 4)  Nadolol 40 Mg Tabs (Nadolol) .... One by mouth daily 5)  Symbicort 160-4.5 Mcg/act Aero (Budesonide-formoterol fumarate) .... Two puf by mouth bid 6)  Proventil Hfa 108 (90 Base) Mcg/act Aers (Albuterol sulfate)  .... 2 puffs every 4 hours as needed wheezing, chest tightness 7)  Wellbutrin Xl 300 Mg Xr24h-tab (Bupropion hcl) .... One by mouth daily 8)  Calcium 500 Mg Tabs (Calcium carbonate) .... 2 once daily 9)  Fish Oil 500 Mg Caps (Omega-3 fatty acids) .... 3 once daily 10)  Benadryl 25 Mg Caps (Diphenhydramine hcl) .... As needed 11)  Astepro 0.15 % Soln (Azelastine hcl) .... Use as directed 12)  Omnaris 50 Mcg/act Susp (Ciclesonide) .... Two spray in nostril daily 13)  Medrol (pak) 4 Mg Tabs (Methylprednisolone) .... Take as directed  ( generic) 14)  Azithromycin 250 Mg Tabs (Azithromycin) .... Two by mouth now then one by mouth daily for 4 days 15)  Benzonatate 200 Mg Caps (Benzonatate) .... One by mouth q 6 hour as needed cough 16)  Nasotuss 08-04-24 Mg/54ml Liqd (Cod-phenyleph-chlorcyclizine) .... Two tsp by mouth q hs prn  Hypertension Assessment/Plan:      The patient's hypertensive risk group is category B: At least one risk factor (excluding diabetes) with no target organ damage.  Today's blood pressure is 160/80.  Her blood pressure goal is <  140/90.  Patient Instructions: 1)  Please schedule a follow-up appointment in 2 months. Prescriptions: BENZONATATE 200 MG CAPS (BENZONATATE) one by mouth q 6 hour as needed cough  #30 x 0   Entered and Authorized by:   [name] MD   Signed by:   [name] MD on 01/06/2010   Method used:   Electronically to        [facility] [phone]* (retail)       [address]       [place], [place]  [address]       Ph: [phone] or [phone]       Fax: [phone]   RxID:   [id number] AZITHROMYCIN 250 MG TABS (AZITHROMYCIN) two by mouth now then one by mouth daily for 4 days  #6 x 0   Entered and Authorized by:   [name] MD   Signed by:   [name] MD on 01/06/2010   Method used:   Electronically to        [facility] [phone]* (retail)       [address]        [place], [place]  [address]       Ph: [phone] or [phone]       Fax: [phone]   RxID:   [id number] MEDROL (PAK) 4 MG TABS (METHYLPREDNISOLONE) take as directed  ( generic)  #1 pk x 0   Entered and Authorized by:   [name] MD   Signed by:   [name] MD on 01/06/2010   Method used:   Electronically to        [facility] [phone]* (retail)       [address]       [place], [place]  [address]       Ph: [phone] or [phone]       Fax: [phone]   RxID:   [id number]

## 2011-02-04 ENCOUNTER — Telehealth: Payer: Self-pay | Admitting: Internal Medicine

## 2011-02-04 NOTE — Telephone Encounter (Signed)
Need more samples of Nexium 1 qd  until her appt on 02-11-2011. Please call patient when done.

## 2011-02-06 ENCOUNTER — Other Ambulatory Visit: Payer: Self-pay | Admitting: *Deleted

## 2011-02-06 DIAGNOSIS — K219 Gastro-esophageal reflux disease without esophagitis: Secondary | ICD-10-CM

## 2011-02-06 MED ORDER — ESOMEPRAZOLE MAGNESIUM 40 MG PO CPDR: 40.0000 mg | DELAYED_RELEASE_CAPSULE | Freq: Every day | ORAL | Status: DC

## 2011-02-06 NOTE — Telephone Encounter (Signed)
None available- sent in to pharmacy

## 2011-02-17 ENCOUNTER — Encounter: Payer: Self-pay | Admitting: Internal Medicine

## 2011-02-25 ENCOUNTER — Encounter: Payer: Self-pay | Admitting: Internal Medicine

## 2011-02-25 ENCOUNTER — Ambulatory Visit (INDEPENDENT_AMBULATORY_CARE_PROVIDER_SITE_OTHER): Payer: BC Managed Care – PPO | Admitting: Internal Medicine

## 2011-02-25 VITALS — BP 132/80 | HR 76 | Temp 98.2°F | Resp 16 | Ht 62.0 in | Wt 141.0 lb

## 2011-02-25 DIAGNOSIS — K219 Gastro-esophageal reflux disease without esophagitis: Secondary | ICD-10-CM

## 2011-02-25 DIAGNOSIS — R0681 Apnea, not elsewhere classified: Secondary | ICD-10-CM

## 2011-02-25 DIAGNOSIS — R059 Cough, unspecified: Secondary | ICD-10-CM

## 2011-02-25 DIAGNOSIS — R05 Cough: Secondary | ICD-10-CM

## 2011-02-25 DIAGNOSIS — I10 Essential (primary) hypertension: Secondary | ICD-10-CM

## 2011-02-25 DIAGNOSIS — J441 Chronic obstructive pulmonary disease with (acute) exacerbation: Secondary | ICD-10-CM

## 2011-02-25 NOTE — Progress Notes (Signed)
  Subjective:    Patient ID: Felicia Perry, female    DOB: 06-21-1946, 65 y.o.   MRN: [id number]  HPI the patient has a history of peptic ulcer disease and chronic reflux.  Become apparent that the chronic reflux is a major cause of her asthmatic bronchitis and that when she stopped her proton pump inhibitor she immediately had symptoms and then developed worsening of her chronic cough.  On exam today she has no apparent fever nor pneumonia but I believe she has a pneumonitis from acid aspiration and worsening reflux we will keep her on the Nexium on a regular basis with a refillable prescription and samples today    Review of Systems  Constitutional: Negative for activity change, appetite change and fatigue.  HENT: Negative for ear pain, congestion, neck pain, postnasal drip and sinus pressure.   Eyes: Negative for redness and visual disturbance.  Respiratory: Negative for cough, shortness of breath and wheezing.   Gastrointestinal: Negative for abdominal pain and abdominal distention.  Genitourinary: Negative for dysuria, frequency and menstrual problem.  Musculoskeletal: Negative for myalgias, joint swelling and arthralgias.  Skin: Negative for rash and wound.  Neurological: Negative for dizziness, weakness and headaches.  Hematological: Negative for adenopathy. Does not bruise/bleed easily.  Psychiatric/Behavioral: Negative for sleep disturbance and decreased concentration.       Objective:   Physical Exam  Constitutional: She is oriented to person, place, and time. She appears well-developed and well-nourished. No distress.  HENT:  Head: Normocephalic and atraumatic.  Right Ear: External ear normal.  Left Ear: External ear normal.  Nose: Nose normal.  Mouth/Throat: Oropharynx is clear and moist.  Eyes: Conjunctivae and EOM are normal. Pupils are equal, round, and reactive to light.  Neck: Normal range of motion. Neck supple. No JVD present. No tracheal deviation present. No  thyromegaly present.  Cardiovascular: Normal rate, regular rhythm, normal heart sounds and intact distal pulses.   No murmur heard. Pulmonary/Chest: Effort normal and breath sounds normal. She has no wheezes. She exhibits no tenderness.  Abdominal: Soft. Bowel sounds are normal.  Musculoskeletal: Normal range of motion. She exhibits no edema and no tenderness.  Lymphadenopathy:    She has no cervical adenopathy.  Neurological: She is alert and oriented to person, place, and time. She has normal reflexes. No cranial nerve deficit.  Skin: Skin is warm and dry. She is not diaphoretic.  Psychiatric: She has a normal mood and affect. Her behavior is normal.          Assessment & Plan:  She is due for colonoscopy and because of her history of peptic ulcer disease and a recent flare of her symptoms off her Nexium I would recommend that she consider getting an EGD at the same time she gets her colonoscopy. We'll copy Dr. Jarold Motto on today's note  We will treat the cough with samples of a codeine-containing cough medicine and resume the Nexium twice a day for one week then daily thereafter  Blood pressure is in good control her weight is stable She did have a physical in July

## 2011-03-18 ENCOUNTER — Other Ambulatory Visit: Payer: Self-pay | Admitting: Internal Medicine

## 2011-03-25 ENCOUNTER — Ambulatory Visit (INDEPENDENT_AMBULATORY_CARE_PROVIDER_SITE_OTHER)
Admission: RE | Admit: 2011-03-25 | Discharge: 2011-03-25 | Disposition: A | Discharge status: Home / Self Care | Payer: BC Managed Care – PPO | Source: Ambulatory Visit | Attending: Internal Medicine | Admitting: Internal Medicine

## 2011-03-25 ENCOUNTER — Encounter: Payer: Self-pay | Admitting: Gastroenterology

## 2011-03-25 ENCOUNTER — Ambulatory Visit (INDEPENDENT_AMBULATORY_CARE_PROVIDER_SITE_OTHER): Payer: BC Managed Care – PPO | Admitting: Gastroenterology

## 2011-03-25 VITALS — BP 136/82 | HR 68 | Ht 62.0 in | Wt 139.0 lb

## 2011-03-25 DIAGNOSIS — R05 Cough: Secondary | ICD-10-CM

## 2011-03-25 DIAGNOSIS — R634 Abnormal weight loss: Secondary | ICD-10-CM

## 2011-03-25 DIAGNOSIS — K219 Gastro-esophageal reflux disease without esophagitis: Secondary | ICD-10-CM

## 2011-03-25 DIAGNOSIS — Z86010 Personal history of colonic polyps: Secondary | ICD-10-CM

## 2011-03-25 DIAGNOSIS — R059 Cough, unspecified: Secondary | ICD-10-CM

## 2011-03-25 MED ORDER — PEG-KCL-NACL-NASULF-NA ASC-C 100 G PO SOLR: 1.0000 | Freq: Once | ORAL | Status: AC

## 2011-03-25 NOTE — Patient Instructions (Signed)
You have been scheduled for a Upper Endoscopy/ Colonoscopy. Separate instructions given. Pick up your prep from your pharmacy.  cc: Darryll Capers, MD

## 2011-03-25 NOTE — Progress Notes (Signed)
History of Present Illness: This is a 65 year old female that I have seen in the past. She underwent colonoscopy in January 2007 for routine screening revealing mild diverticulosis, small internal hemorrhoids and a 5 mm adenomatous colon polyp. We sent a reminder letter for a recall colonoscopy in January 2012 but she has not called to schedule. She complains of a decrease in appetite and a 20 pound weight loss over the past few months. She had previously had a problem with a chronic cough and was treated with an empiric course of Nexium by Dr. Lovell Sheehan with resolution of the cough. As her cough improved her appetite loss and weight loss developed. She has occasional small amount of red blood per rectum and frequent abdominal bloating. She denies dysphagia, odynophagia, nausea, vomiting, abdominal pain, chest pain, change in stool caliber, melena, hematochezia.  Review of Systems: Pertinent positive and negative review of systems were noted in the above HPI section. All other review of systems were otherwise negative.  Current Medications, Allergies, Past Medical History, Past Surgical History, Family History and Social History were reviewed in [employer] record.  Physical Exam: General: Well developed , well nourished, no acute distress Head: Normocephalic and atraumatic Eyes:  sclerae anicteric, EOMI Ears: Normal auditory acuity Mouth: No deformity or lesions Neck: Supple, no masses or thyromegaly Lungs: Clear throughout to auscultation Heart: Regular rate and rhythm; no murmurs, rubs or bruits Abdomen: Soft, non tender and non distended. No masses, hepatosplenomegaly or hernias noted. Normal Bowel sounds Rectal: Deferred to colonoscopy Musculoskeletal: Symmetrical with no gross deformities  Skin: No lesions on visible extremities Pulses:  Normal pulses noted Extremities: No clubbing, cyanosis, edema or deformities noted Neurological: Alert oriented x 4, grossly  nonfocal Cervical Nodes:  No significant cervical adenopathy Inguinal Nodes: No significant inguinal adenopathy Psychological:  Alert and cooperative. Mildly anxious and tearful at a few points during the office visit  Assessment and Recommendations:  1. Anorexia, weight loss and GERD. GERD is likely the cause of her chronic cough. Rule out ulcer disease, esophagitis, Barrett's esophagus and upper GI tract neoplasms. Schedule endoscopy. Continue Nexium 40 mg daily and antireflux measures. The risks, benefits, and alternatives to endoscopy with possible biopsy and possible dilation were discussed with the patient and they consent to proceed. If the endoscopy is nondiagnostic consider proceeding with an abdominal pelvic CT scan for further evaluation.  2. Personal history of adenomatous colon polyps and weight loss. Rule out colorectal neoplasms. The risks, benefits, and alternatives to colonoscopy with possible biopsy and possible polypectomy were discussed with the patient and they consent to proceed.

## 2011-03-26 ENCOUNTER — Encounter: Payer: Self-pay | Admitting: Gastroenterology

## 2011-04-01 ENCOUNTER — Encounter: Payer: Self-pay | Admitting: Gastroenterology

## 2011-04-01 ENCOUNTER — Ambulatory Visit (AMBULATORY_SURGERY_CENTER): Payer: BC Managed Care – PPO | Admitting: Gastroenterology

## 2011-04-01 VITALS — BP 145/91 | HR 66 | Temp 97.8°F | Resp 20 | Ht 62.0 in | Wt 139.0 lb

## 2011-04-01 DIAGNOSIS — D126 Benign neoplasm of colon, unspecified: Secondary | ICD-10-CM

## 2011-04-01 DIAGNOSIS — K219 Gastro-esophageal reflux disease without esophagitis: Secondary | ICD-10-CM

## 2011-04-01 DIAGNOSIS — R634 Abnormal weight loss: Secondary | ICD-10-CM

## 2011-04-01 DIAGNOSIS — Z1211 Encounter for screening for malignant neoplasm of colon: Secondary | ICD-10-CM

## 2011-04-01 DIAGNOSIS — Z860100 Personal history of colon polyps, unspecified: Secondary | ICD-10-CM

## 2011-04-01 MED ORDER — SODIUM CHLORIDE 0.9 % IV SOLN: 500.0000 mL | INTRAVENOUS | Status: DC

## 2011-04-01 NOTE — Patient Instructions (Signed)
Follow discharge instructions.  Continue your medications.  High Fiber Diet with liberal fluid intake.  Next Colonoscopy in 5 years.

## 2011-04-02 ENCOUNTER — Telehealth: Payer: Self-pay | Admitting: *Deleted

## 2011-04-02 NOTE — Telephone Encounter (Signed)
No id on machine, no message left  

## 2011-04-08 ENCOUNTER — Encounter: Payer: Self-pay | Admitting: Gastroenterology

## 2011-04-21 ENCOUNTER — Other Ambulatory Visit: Payer: Self-pay | Admitting: Internal Medicine

## 2011-05-12 ENCOUNTER — Other Ambulatory Visit (INDEPENDENT_AMBULATORY_CARE_PROVIDER_SITE_OTHER): Payer: BC Managed Care – PPO

## 2011-05-12 DIAGNOSIS — Z0000 Encounter for general adult medical examination without abnormal findings: Secondary | ICD-10-CM

## 2011-05-12 LAB — POCT URINALYSIS DIPSTICK
Bilirubin, UA: NEGATIVE
Ketones, UA: NEGATIVE
Protein, UA: NEGATIVE
Spec Grav, UA: 1.01
pH, UA: 7

## 2011-05-12 LAB — CBC WITH DIFFERENTIAL/PLATELET
Basophils Absolute: 0.1 10*3/uL (ref 0.0–0.1)
Eosinophils Absolute: 0.2 10*3/uL (ref 0.0–0.7)
HCT: 42.7 % (ref 36.0–46.0)
Hemoglobin: 14.4 g/dL (ref 12.0–15.0)
Lymphs Abs: 2.1 10*3/uL (ref 0.7–4.0)
MCHC: 33.8 g/dL (ref 30.0–36.0)
MCV: 88.5 fl (ref 78.0–100.0)
Monocytes Absolute: 0.8 10*3/uL (ref 0.1–1.0)
Monocytes Relative: 9 % (ref 3.0–12.0)
Neutro Abs: 5.5 10*3/uL (ref 1.4–7.7)
Platelets: 296 10*3/uL (ref 150.0–400.0)
RDW: 13.9 % (ref 11.5–14.6)

## 2011-05-12 LAB — HEPATIC FUNCTION PANEL
ALT: 14 U/L (ref 0–35)
AST: 16 U/L (ref 0–37)
Alkaline Phosphatase: 88 U/L (ref 39–117)
Bilirubin, Direct: 0 mg/dL (ref 0.0–0.3)
Total Bilirubin: 0.6 mg/dL (ref 0.3–1.2)
Total Protein: 7.4 g/dL (ref 6.0–8.3)

## 2011-05-12 LAB — BASIC METABOLIC PANEL
BUN: 14 mg/dL (ref 6–23)
CO2: 29 mEq/L (ref 19–32)
GFR: 73.36 mL/min (ref >60.00)
Glucose, Bld: 110 mg/dL — ABNORMAL HIGH (ref 70–99)
Potassium: 3.9 mEq/L (ref 3.5–5.1)

## 2011-05-12 LAB — LIPID PANEL: Cholesterol: 176 mg/dL (ref 0–200)

## 2011-05-13 ENCOUNTER — Other Ambulatory Visit: Payer: BC Managed Care – PPO

## 2011-05-20 ENCOUNTER — Ambulatory Visit (INDEPENDENT_AMBULATORY_CARE_PROVIDER_SITE_OTHER): Payer: BC Managed Care – PPO | Admitting: Internal Medicine

## 2011-05-20 ENCOUNTER — Encounter: Payer: Self-pay | Admitting: Internal Medicine

## 2011-05-20 VITALS — BP 140/80 | HR 72 | Temp 98.2°F | Resp 16 | Ht 62.0 in | Wt 138.0 lb

## 2011-05-20 DIAGNOSIS — I10 Essential (primary) hypertension: Secondary | ICD-10-CM

## 2011-05-20 DIAGNOSIS — M1990 Unspecified osteoarthritis, unspecified site: Secondary | ICD-10-CM

## 2011-05-20 DIAGNOSIS — M545 Low back pain, unspecified: Secondary | ICD-10-CM

## 2011-05-20 DIAGNOSIS — Z0000 Encounter for general adult medical examination without abnormal findings: Secondary | ICD-10-CM

## 2011-05-20 NOTE — Progress Notes (Signed)
  Subjective:    Patient ID: Felicia Perry, female    DOB: 07/19/1946, 65 y.o.   MRN: [id number]  HPI Patient presents for complete physical examination.  She is also followed for hypertension mild hyperlipidemia and a family risk of adult onset diabetes.  Of note she has a slight elevation of glucose and triglycerides and her labs and we discussed the risks of diabetes given her genetic   Review of Systems     Objective:   Physical Exam        Assessment & Plan:   This is a routine physical examination for this healthy  Female. Reviewed all health maintenance protocols including mammography colonoscopy bone density and reviewed appropriate screening labs. Her immunization history was reviewed as well as her current medications and allergies refills of her chronic medications were given and the plan for yearly health maintenance was discussed all orders and referrals were made as appropriate.   Review of her diabetic risks we discussed weight loss control of sugar she admits that she eats sweet tea on a regular basis we discussed the limited concentrated sweets from her life to reduce her risks of adult onset diabetes

## 2011-05-20 NOTE — Progress Notes (Signed)
  Subjective:    Patient ID: Felicia Perry, female    DOB: 1946/09/15, 65 y.o.   MRN: [id number]  HPI    Review of Systems  Constitutional: Negative for activity change, appetite change and fatigue.  HENT: Negative for ear pain, congestion, neck pain, postnasal drip and sinus pressure.   Eyes: Negative for redness and visual disturbance.  Respiratory: Negative for cough, shortness of breath and wheezing.   Gastrointestinal: Negative for abdominal pain and abdominal distention.  Genitourinary: Negative for dysuria, frequency and menstrual problem.  Musculoskeletal: Negative for myalgias, joint swelling and arthralgias.  Skin: Negative for rash and wound.  Neurological: Negative for dizziness, weakness and headaches.  Hematological: Negative for adenopathy. Does not bruise/bleed easily.  Psychiatric/Behavioral: Negative for sleep disturbance and decreased concentration.   Past Medical History  Diagnosis Date  . Anxiety   . Asthma   . Hypertension   . Arthritis   . Diverticulosis   . Low back pain   . History of shingles   . PUD (peptic ulcer disease)   . Internal hemorrhoids   . Adenomatous polyp 11/23/2005  . GERD (gastroesophageal reflux disease)    Past Surgical History  Procedure Date  . 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 She has never used smokeless tobacco. She reports that she does not drink alcohol or use illicit drugs. family history includes COPD in her father; Diabetes in her mother; Heart disease in her mother; and Hypertension in her mother.  There is no history of Colon cancer. Allergies  Allergen Reactions  . Biaxin   . Cephalosporins     REACTION: tongue swelling  . Clarithromycin     REACTION: blisters in mouth  . Doxycycline Hyclate     REACTION: nausea, vomiting  . Levofloxacin     REACTION: tongue swells  . Penicillins     REACTION: per patient causes rash,hives  . Zolpidem Tartrate       REACTION: difficulty with concentration       Objective:   Physical Exam  Nursing note and vitals reviewed. Constitutional: She is oriented to person, place, and time. She appears well-developed and well-nourished. No distress.  HENT:  Head: Normocephalic and atraumatic.  Right Ear: External ear normal.  Left Ear: External ear normal.  Nose: Nose normal.  Mouth/Throat: Oropharynx is clear and moist.  Eyes: Conjunctivae and EOM are normal. Pupils are equal, round, and reactive to light.  Neck: Normal range of motion. Neck supple. No JVD present. No tracheal deviation present. No thyromegaly present.  Cardiovascular: Normal rate, regular rhythm, normal heart sounds and intact distal pulses.   No murmur heard. Pulmonary/Chest: Effort normal and breath sounds normal. She has no wheezes. She exhibits no tenderness.  Abdominal: Soft. Bowel sounds are normal.  Musculoskeletal: Normal range of motion. She exhibits no edema and no tenderness.  Lymphadenopathy:    She has no cervical adenopathy.  Neurological: She is alert and oriented to person, place, and time. She has normal reflexes. No cranial nerve deficit.  Skin: Skin is warm and dry. She is not diaphoretic.  Psychiatric: She has a normal mood and affect. Her behavior is normal.          Assessment & Plan:

## 2011-06-20 ENCOUNTER — Other Ambulatory Visit: Payer: Self-pay | Admitting: Internal Medicine

## 2011-08-18 ENCOUNTER — Other Ambulatory Visit: Payer: Self-pay | Admitting: Internal Medicine

## 2011-08-20 ENCOUNTER — Telehealth: Payer: Self-pay

## 2011-08-20 DIAGNOSIS — S6990XA Unspecified injury of unspecified wrist, hand and finger(s), initial encounter: Secondary | ICD-10-CM

## 2011-08-20 NOTE — Telephone Encounter (Signed)
Pt called and stated that she jammed her hand about 1 week ago Saturday. Pt states the swelling and buising has gone down on her right hand but her knuckle is still swollen. Pt would like to know if she needs an x-ray.  Pls advise.

## 2011-08-20 NOTE — Telephone Encounter (Signed)
Sent to [facility] for xray

## 2011-08-21 ENCOUNTER — Ambulatory Visit (INDEPENDENT_AMBULATORY_CARE_PROVIDER_SITE_OTHER)
Admission: RE | Admit: 2011-08-21 | Discharge: 2011-08-21 | Disposition: A | Discharge status: Home / Self Care | Payer: BC Managed Care – PPO | Source: Ambulatory Visit | Attending: Internal Medicine | Admitting: Internal Medicine

## 2011-08-21 ENCOUNTER — Telehealth: Payer: Self-pay

## 2011-08-21 DIAGNOSIS — S6990XA Unspecified injury of unspecified wrist, hand and finger(s), initial encounter: Secondary | ICD-10-CM

## 2011-08-21 NOTE — Telephone Encounter (Signed)
Possible diplaced fx

## 2011-08-21 NOTE — Telephone Encounter (Signed)
possible non displaced fx- pt informed and will go to [facility] at 5:30--copy of xray report up front for pt to pick up and takei

## 2011-08-21 NOTE — Telephone Encounter (Signed)
Pt would like results of hand xray

## 2011-09-11 ENCOUNTER — Other Ambulatory Visit: Payer: Self-pay | Admitting: Internal Medicine

## 2011-09-21 ENCOUNTER — Ambulatory Visit (INDEPENDENT_AMBULATORY_CARE_PROVIDER_SITE_OTHER): Payer: Medicare Other | Admitting: Internal Medicine

## 2011-09-21 ENCOUNTER — Encounter: Payer: Self-pay | Admitting: Internal Medicine

## 2011-09-21 VITALS — BP 120/70 | HR 72 | Temp 98.2°F | Resp 16 | Ht 62.0 in | Wt 149.0 lb

## 2011-09-21 DIAGNOSIS — S6290XA Unspecified fracture of unspecified wrist and hand, initial encounter for closed fracture: Secondary | ICD-10-CM

## 2011-09-21 DIAGNOSIS — F419 Anxiety disorder, unspecified: Secondary | ICD-10-CM

## 2011-09-21 DIAGNOSIS — I10 Essential (primary) hypertension: Secondary | ICD-10-CM

## 2011-09-21 DIAGNOSIS — S62309A Unspecified fracture of unspecified metacarpal bone, initial encounter for closed fracture: Secondary | ICD-10-CM

## 2011-09-21 DIAGNOSIS — F411 Generalized anxiety disorder: Secondary | ICD-10-CM

## 2011-09-21 DIAGNOSIS — Z23 Encounter for immunization: Secondary | ICD-10-CM

## 2011-09-21 MED ORDER — ALPRAZOLAM 0.5 MG PO TABS: 0.5000 mg | ORAL_TABLET | Freq: Three times a day (TID) | ORAL | Status: DC | PRN

## 2011-09-21 MED ORDER — CITALOPRAM HYDROBROMIDE 10 MG PO TABS: 10.0000 mg | ORAL_TABLET | Freq: Every day | ORAL | Status: DC

## 2011-09-21 NOTE — Patient Instructions (Signed)
The patient is instructed to continue all medications as prescribed. Schedule followup with check out clerk upon leaving the clinic  

## 2011-09-21 NOTE — Progress Notes (Signed)
Subjective:    Patient ID: Felicia Perry, female    DOB: Oct 17, 1946, 65 y.o.   MRN: [id number]  HPI Had a fractured hand, was seen at [facility] urgently and was given buddy tap The trauma occurred about Oct 20?  She has limited range of motion Her blood pressure has been stable and current medications gastroesophageal reflux has been stable    Review of Systems  Constitutional: Negative for activity change, appetite change and fatigue.  HENT: Negative for ear pain, congestion, neck pain, postnasal drip and sinus pressure.   Eyes: Negative for redness and visual disturbance.  Respiratory: Negative for cough, shortness of breath and wheezing.   Gastrointestinal: Negative for abdominal pain and abdominal distention.  Genitourinary: Negative for dysuria, frequency and menstrual problem.  Musculoskeletal: Negative for myalgias, joint swelling and arthralgias.  Skin: Negative for rash and wound.  Neurological: Negative for dizziness, weakness and headaches.  Hematological: Negative for adenopathy. Does not bruise/bleed easily.  Psychiatric/Behavioral: Negative for sleep disturbance and decreased concentration.   Past Medical History  Diagnosis Date  . Anxiety   . Asthma   . Hypertension   . Arthritis   . Diverticulosis   . Low back pain   . History of shingles   . PUD (peptic ulcer disease)   . Internal hemorrhoids   . Adenomatous polyp 11/23/2005  . GERD (gastroesophageal reflux disease)     History   Social History  . Marital Status: Divorced    Spouse Name: N/A    Number of Children: N/A  . Years of Education: N/A   Occupational History  . Cleaning    Social History Main Topics  . Smoking status: Former Games developer  . Smokeless tobacco: Never Used  . Alcohol Use: No  . Drug Use: No  . Sexually Active: Yes   Other Topics Concern  . Not on file   Social History Narrative   3 glasses of tea daily     Past Surgical History  Procedure Date  . 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 Heart disease Mother   . Hypertension Mother   . Diabetes Mother   . COPD Father   . Colon cancer Neg Hx     Allergies  Allergen Reactions  . Biaxin   . Cephalosporins     REACTION: tongue swelling  . Clarithromycin     REACTION: blisters in mouth  . Doxycycline Hyclate     REACTION: nausea, vomiting  . Levofloxacin     REACTION: tongue swells  . Penicillins     REACTION: per patient causes rash,hives  . Zolpidem Tartrate     REACTION: difficulty with concentration    Current Outpatient Prescriptions on File Prior to Visit  Medication Sig Dispense Refill  . albuterol (VENTOLIN HFA) 108 (90 BASE) MCG/ACT inhaler Inhale 2 puffs into the lungs every 4 (four) hours as needed.        . calcium gluconate 500 MG tablet Take 500 mg by mouth 2 (two) times daily.        Marland Kitchen EXFORGE 10-160 MG per tablet take 1 tablet by mouth once daily  30 tablet  6  . fish oil-omega-3 fatty acids 1000 MG capsule Take by mouth daily. Once daily      . ibuprofen (MOTRIN IB) 200 MG tablet Take 200 mg by mouth. As needed        .  nadolol (CORGARD) 40 MG tablet take 1 tablet by mouth once daily  30 tablet  10  . NEXIUM 40 MG capsule take 1 capsule by mouth once daily  30 capsule  1  . Omega-3 Fatty Acids (FISH OIL BURP-LESS) 500 MG CAPS Take 3 capsules by mouth daily.         Current Facility-Administered Medications on File Prior to Visit  Medication Dose Route Frequency Provider Last Rate Last Dose  . 0.9 %  sodium chloride infusion  500 mL Intravenous Continuous Eliezer Bottom., MD,FACG        BP 120/70  Pulse 72  Temp 98.2 F (36.8 C)  Resp 16  Ht 5\' 2"  (1.575 m)  Wt 149 lb (67.586 kg)  BMI 27.25 kg/m2        Objective:   Physical Exam  Nursing note and vitals reviewed. Constitutional: She is oriented to person, place, and time. She appears well-developed and well-nourished. No distress.    HENT:  Head: Normocephalic and atraumatic.  Right Ear: External ear normal.  Left Ear: External ear normal.  Nose: Nose normal.  Mouth/Throat: Oropharynx is clear and moist.  Eyes: Conjunctivae and EOM are normal. Pupils are equal, round, and reactive to light.  Neck: Normal range of motion. Neck supple. No JVD present. No tracheal deviation present. No thyromegaly present.  Cardiovascular: Normal rate, regular rhythm, normal heart sounds and intact distal pulses.   No murmur heard. Pulmonary/Chest: Effort normal and breath sounds normal. She has no wheezes. She exhibits no tenderness.  Abdominal: Soft. Bowel sounds are normal.  Musculoskeletal: Normal range of motion. She exhibits no edema and no tenderness.  Lymphadenopathy:    She has no cervical adenopathy.  Neurological: She is alert and oriented to person, place, and time. She has normal reflexes. No cranial nerve deficit.  Skin: Skin is warm and dry. She is not diaphoretic.  Psychiatric: She has a normal mood and affect. Her behavior is normal.          Assessment & Plan:  Hand injury with probable fracture seen by orthopedics but he taping suggested she has some decreased range of motion to her hand she states especially improvement we discussed continued exercise and the possibility of referral to hand physical therapy if she does not progress appropriately.  Patient's blood pressure is stable her current medications Gastroesophageal reflux has improved

## 2011-09-22 ENCOUNTER — Other Ambulatory Visit: Payer: Self-pay | Admitting: *Deleted

## 2011-09-22 DIAGNOSIS — F419 Anxiety disorder, unspecified: Secondary | ICD-10-CM

## 2011-09-22 MED ORDER — CITALOPRAM HYDROBROMIDE 10 MG PO TABS: 10.0000 mg | ORAL_TABLET | Freq: Every day | ORAL | Status: DC

## 2011-09-23 LAB — PTH, INTACT AND CALCIUM: Calcium, Total (PTH): 9.6 mg/dL (ref 8.4–10.5)

## 2011-09-25 ENCOUNTER — Telehealth: Payer: Self-pay | Admitting: *Deleted

## 2011-09-25 NOTE — Telephone Encounter (Signed)
Patient is calling for lab results.

## 2011-09-25 NOTE — Telephone Encounter (Signed)
Ok per dr jenkins -p[t informed 

## 2011-10-01 ENCOUNTER — Telehealth: Payer: Self-pay | Admitting: *Deleted

## 2011-10-01 DIAGNOSIS — S62609A Fracture of unspecified phalanx of unspecified finger, initial encounter for closed fracture: Secondary | ICD-10-CM

## 2011-10-01 NOTE — Telephone Encounter (Signed)
Pt informed

## 2011-10-01 NOTE — Telephone Encounter (Signed)
Pt called requesting referral to [facility] re: right hand fracture. Pt states she continues to have pain. Please advise. Pt would like return call on cell #.

## 2011-10-05 ENCOUNTER — Encounter: Payer: Self-pay | Admitting: Internal Medicine

## 2011-10-27 DIAGNOSIS — J302 Other seasonal allergic rhinitis: Secondary | ICD-10-CM

## 2011-10-27 HISTORY — DX: Other seasonal allergic rhinitis: J30.2

## 2011-11-13 ENCOUNTER — Encounter: Payer: Self-pay | Admitting: Internal Medicine

## 2011-11-13 ENCOUNTER — Ambulatory Visit (INDEPENDENT_AMBULATORY_CARE_PROVIDER_SITE_OTHER): Payer: Medicare Other | Admitting: Internal Medicine

## 2011-11-13 VITALS — BP 162/92 | HR 60 | Temp 98.5°F | Ht 62.0 in | Wt 149.0 lb

## 2011-11-13 DIAGNOSIS — J309 Allergic rhinitis, unspecified: Secondary | ICD-10-CM

## 2011-11-13 DIAGNOSIS — Z9109 Other allergy status, other than to drugs and biological substances: Secondary | ICD-10-CM

## 2011-11-13 DIAGNOSIS — M949 Disorder of cartilage, unspecified: Secondary | ICD-10-CM

## 2011-11-13 DIAGNOSIS — M899 Disorder of bone, unspecified: Secondary | ICD-10-CM

## 2011-11-13 DIAGNOSIS — I10 Essential (primary) hypertension: Secondary | ICD-10-CM

## 2011-11-13 DIAGNOSIS — M84439A Pathological fracture, unspecified ulna and radius, initial encounter for fracture: Secondary | ICD-10-CM

## 2011-11-13 DIAGNOSIS — M898X9 Other specified disorders of bone, unspecified site: Secondary | ICD-10-CM

## 2011-11-13 MED ORDER — OLMESARTAN-AMLODIPINE-HCTZ 40-5-12.5 MG PO TABS: 1.0000 | ORAL_TABLET | Freq: Every day | ORAL | Status: DC

## 2011-11-13 MED ORDER — LEVOCETIRIZINE DIHYDROCHLORIDE 5 MG PO TABS: 5.0000 mg | ORAL_TABLET | Freq: Every evening | ORAL | Status: DC

## 2011-11-13 NOTE — Progress Notes (Signed)
Subjective:    Patient ID: Felicia Perry, female    DOB: 1946-06-07, 66 y.o.   MRN: [id number]  HPI Patient presents with a erythematous change to her scans that is raised and almost plaque-like on her lower extremities more pronounced on the medial aspects and almost a stocking foot distribution. She states that often the rash is extremely voracious it does not extend up to the knee or is in any other extremity.  It occurs whether she is wearing socks or not and she does not believe that this has any contact dermatitis possibilities. She states that she does experience itching prior to the rash breaking out She does not feel any swelling in the feet or legs. The rash stops at the ankle and does not extend onto the foot   Review of Systems  Constitutional: Negative for activity change, appetite change and fatigue.  HENT: Negative for ear pain, congestion, neck pain, postnasal drip and sinus pressure.   Eyes: Negative for redness and visual disturbance.  Respiratory: Negative for cough, shortness of breath and wheezing.   Cardiovascular: Positive for leg swelling.  Gastrointestinal: Negative for abdominal pain and abdominal distention.  Genitourinary: Negative for dysuria, frequency and menstrual problem.  Musculoskeletal: Negative for myalgias, joint swelling and arthralgias.  Skin: Positive for color change and rash. Negative for wound.  Neurological: Negative for dizziness, weakness and headaches.  Hematological: Negative for adenopathy. Does not bruise/bleed easily.  Psychiatric/Behavioral: Negative for sleep disturbance and decreased concentration.   Past Medical History  Diagnosis Date  . Anxiety   . Asthma   . Hypertension   . Arthritis   . Diverticulosis   . Low back pain   . History of shingles   . PUD (peptic ulcer disease)   . Internal hemorrhoids   . Adenomatous polyp 11/23/2005  . GERD (gastroesophageal reflux disease)     History   Social History  . Marital  Status: Divorced    Spouse Name: N/A    Number of Children: N/A  . Years of Education: N/A   Occupational History  . Cleaning    Social History Main Topics  . Smoking status: Former Games developer  . Smokeless tobacco: Never Used  . Alcohol Use: No  . Drug Use: No  . Sexually Active: Yes   Other Topics Concern  . Not on file   Social History Narrative   3 glasses of tea daily     Past Surgical History  Procedure Date  . 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 Heart disease Mother   . Hypertension Mother   . Diabetes Mother   . COPD Father   . Colon cancer Neg Hx     Allergies  Allergen Reactions  . Biaxin   . Cephalosporins     REACTION: tongue swelling  . Clarithromycin     REACTION: blisters in mouth  . Doxycycline Hyclate     REACTION: nausea, vomiting  . Levofloxacin     REACTION: tongue swells  . Penicillins     REACTION: per patient causes rash,hives  . Zolpidem Tartrate     REACTION: difficulty with concentration    Current Outpatient Prescriptions on File Prior to Visit  Medication Sig Dispense Refill  . albuterol (VENTOLIN HFA) 108 (90 BASE) MCG/ACT inhaler Inhale 2 puffs into the lungs every 4 (four) hours as needed.        Marland Kitchen  ALPRAZolam (XANAX) 0.5 MG tablet Take 1 tablet (0.5 mg total) by mouth 3 (three) times daily as needed for sleep.  90 tablet  4  . calcium gluconate 500 MG tablet Take 500 mg by mouth 2 (two) times daily.        . citalopram (CELEXA) 10 MG tablet Take 1 tablet (10 mg total) by mouth daily.  30 tablet  6  . EXFORGE 10-160 MG per tablet take 1 tablet by mouth once daily  30 tablet  6  . fexofenadine-pseudoephedrine (ALLEGRA-D 24) 180-240 MG per 24 hr tablet Take 1 tablet by mouth daily.        . fish oil-omega-3 fatty acids 1000 MG capsule Take by mouth daily. Once daily      . ibuprofen (MOTRIN IB) 200 MG tablet Take 200 mg by mouth. As needed        . nadolol  (CORGARD) 40 MG tablet take 1 tablet by mouth once daily  30 tablet  10  . NEXIUM 40 MG capsule take 1 capsule by mouth once daily  30 capsule  1  . Omega-3 Fatty Acids (FISH OIL BURP-LESS) 500 MG CAPS Take 3 capsules by mouth daily.         Current Facility-Administered Medications on File Prior to Visit  Medication Dose Route Frequency Provider Last Rate Last Dose  . 0.9 %  sodium chloride infusion  500 mL Intravenous Continuous Eliezer Bottom., MD,FACG        BP 162/92  Pulse 60  Temp(Src) 98.5 F (36.9 C) (Oral)  Wt 149 lb (67.586 kg)       Objective:   Physical Exam  Nursing note and vitals reviewed. Constitutional: She is oriented to person, place, and time. She appears well-developed and well-nourished. No distress.  HENT:  Head: Normocephalic and atraumatic.  Right Ear: External ear normal.  Left Ear: External ear normal.  Nose: Nose normal.  Mouth/Throat: Oropharynx is clear and moist.  Eyes: Conjunctivae and EOM are normal. Pupils are equal, round, and reactive to light.  Neck: Normal range of motion. Neck supple. No JVD present. No tracheal deviation present. No thyromegaly present.  Cardiovascular: Normal rate, regular rhythm, normal heart sounds and intact distal pulses.   No murmur heard. Pulmonary/Chest: Effort normal and breath sounds normal. She has no wheezes. She exhibits no tenderness.  Abdominal: Soft. Bowel sounds are normal.  Musculoskeletal: Normal range of motion. She exhibits no edema and no tenderness.  Lymphadenopathy:    She has no cervical adenopathy.  Neurological: She is alert and oriented to person, place, and time. She has normal reflexes. No cranial nerve deficit.  Skin: Skin is warm and dry. She is not diaphoretic.  Psychiatric: She has a normal mood and affect. Her behavior is normal.          Assessment & Plan:   I believe this intermittent rash in her lower extremities is somehow related to an allergic phenomena. I am going to  cover her for several possibilities however in the differential diagnoses including testing for Lyme disease and testing for both [place] allergies  Am going to change her Allegra-D to Zyrtec twice daily or xyzal  Change blood pressure medication to add diuretic

## 2011-11-13 NOTE — Patient Instructions (Signed)
The patient is instructed to continue all medications as prescribed. Schedule followup with check out clerk upon leaving the clinic  

## 2011-11-16 LAB — PTH, INTACT AND CALCIUM
Calcium, Total (PTH): 9.3 mg/dL (ref 8.4–10.5)
PTH: 84.7 pg/mL — ABNORMAL HIGH (ref 14.0–72.0)

## 2011-11-17 ENCOUNTER — Other Ambulatory Visit: Payer: Self-pay | Admitting: Internal Medicine

## 2011-11-17 ENCOUNTER — Other Ambulatory Visit: Payer: Self-pay | Admitting: *Deleted

## 2011-11-17 DIAGNOSIS — E215 Disorder of parathyroid gland, unspecified: Secondary | ICD-10-CM

## 2011-11-23 ENCOUNTER — Ambulatory Visit: Payer: Medicare Other | Admitting: Internal Medicine

## 2011-11-25 ENCOUNTER — Other Ambulatory Visit: Payer: Self-pay | Admitting: Internal Medicine

## 2011-11-30 ENCOUNTER — Encounter (HOSPITAL_COMMUNITY)
Admission: RE | Admit: 2011-11-30 | Discharge: 2011-11-30 | Disposition: A | Discharge status: Home / Self Care | Payer: Medicare Other | Source: Ambulatory Visit | Attending: Internal Medicine | Admitting: Internal Medicine

## 2011-11-30 ENCOUNTER — Ambulatory Visit (HOSPITAL_COMMUNITY)
Admission: RE | Admit: 2011-11-30 | Discharge: 2011-11-30 | Disposition: A | Discharge status: Home / Self Care | Payer: Medicare Other | Source: Ambulatory Visit | Attending: Internal Medicine | Admitting: Internal Medicine

## 2011-11-30 DIAGNOSIS — E215 Disorder of parathyroid gland, unspecified: Secondary | ICD-10-CM

## 2011-11-30 DIAGNOSIS — E213 Hyperparathyroidism, unspecified: Secondary | ICD-10-CM | POA: Insufficient documentation

## 2011-11-30 MED ORDER — TECHNETIUM TC 99M SESTAMIBI - CARDIOLITE
25.6000 | Freq: Once | INTRAVENOUS | Status: AC | PRN
  Administered 2011-11-30: 11:00:00 26 via INTRAVENOUS

## 2011-12-03 ENCOUNTER — Other Ambulatory Visit: Payer: Self-pay | Admitting: Internal Medicine

## 2011-12-03 DIAGNOSIS — E215 Disorder of parathyroid gland, unspecified: Secondary | ICD-10-CM

## 2011-12-03 NOTE — Progress Notes (Signed)
Pt informed and referral sent to [facility] 

## 2011-12-14 ENCOUNTER — Encounter: Payer: Self-pay | Admitting: Internal Medicine

## 2011-12-14 ENCOUNTER — Ambulatory Visit (INDEPENDENT_AMBULATORY_CARE_PROVIDER_SITE_OTHER): Payer: Medicare Other | Admitting: Internal Medicine

## 2011-12-14 VITALS — BP 146/90 | HR 76 | Temp 98.2°F | Resp 16 | Ht 62.0 in | Wt 156.0 lb

## 2011-12-14 DIAGNOSIS — E210 Primary hyperparathyroidism: Secondary | ICD-10-CM

## 2011-12-14 NOTE — Patient Instructions (Signed)
The patient is instructed to continue all medications as prescribed. Schedule followup with check out clerk upon leaving the clinic  

## 2011-12-14 NOTE — Progress Notes (Signed)
Subjective:    Patient ID: Felicia Perry, female    DOB: [birth year], 66 y.o.   MRN: [id number]  HPI Progressive bone pain fatigue now developing some visual symptomatology with probable parathyroid adenoma she had increasing parathyroid hormone and had a nuclear medicine study that suggested uptake in one of her thyroid lobes I think she has worsening parathyroid disease and she has a consult with an endocrinologist but I believe I will go ahead and refer her to the general surgeon given her worsening symptomology   Review of Systems  Constitutional: Positive for activity change, appetite change and fatigue.  HENT: Positive for ear pain.   Eyes: Positive for pain, redness and visual disturbance.  Respiratory: Negative for cough and shortness of breath.   Musculoskeletal: Positive for myalgias, joint swelling and arthralgias.   Past Medical History  Diagnosis Date  . Anxiety   . Asthma   . Hypertension   . Arthritis   . Diverticulosis   . Low back pain   . History of shingles   . PUD (peptic ulcer disease)   . Internal hemorrhoids   . Adenomatous polyp 11/23/2005  . GERD (gastroesophageal reflux disease)     History   Social History  . Marital Status: Divorced    Spouse Name: N/A    Number of Children: N/A  . Years of Education: N/A   Occupational History  . Cleaning    Social History Main Topics  . Smoking status: Former Games developer  . Smokeless tobacco: Never Used  . Alcohol Use: No  . Drug Use: No  . Sexually Active: Yes   Other Topics Concern  . Not on file   Social History Narrative   3 glasses of tea daily     Past Surgical History  Procedure Date  . 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 Heart disease Mother   . Hypertension Mother   . Diabetes Mother   . COPD Father   . Colon cancer Neg Hx     Allergies  Allergen Reactions  . Biaxin   . Cephalosporins     REACTION:  tongue swelling  . Clarithromycin     REACTION: blisters in mouth  . Doxycycline Hyclate     REACTION: nausea, vomiting  . Levofloxacin     REACTION: tongue swells  . Penicillins     REACTION: per patient causes rash,hives  . Zolpidem Tartrate     REACTION: difficulty with concentration    Current Outpatient Prescriptions on File Prior to Visit  Medication Sig Dispense Refill  . ALPRAZolam (XANAX) 0.5 MG tablet Take 1 tablet (0.5 mg total) by mouth 3 (three) times daily as needed for sleep.  90 tablet  4  . citalopram (CELEXA) 10 MG tablet Take 1 tablet (10 mg total) by mouth daily.  30 tablet  6  . fexofenadine-pseudoephedrine (ALLEGRA-D 24) 180-240 MG per 24 hr tablet Take 1 tablet by mouth daily.        . fish oil-omega-3 fatty acids 1000 MG capsule Take by mouth daily. Once daily      . ibuprofen (MOTRIN IB) 200 MG tablet Take 200 mg by mouth. As needed        . levocetirizine (XYZAL) 5 MG tablet Take 1 tablet (5 mg total) by mouth every evening.  60 tablet  2  . NEXIUM 40 MG capsule take 1 capsule by  mouth once daily  30 capsule  1  . Olmesartan-Amlodipine-HCTZ (TRIBENZOR) 40-5-12.5 MG TABS Take 1 capsule by mouth daily.  30 tablet    . Omega-3 Fatty Acids (FISH OIL BURP-LESS) 500 MG CAPS Take 3 capsules by mouth daily.         Current Facility-Administered Medications on File Prior to Visit  Medication Dose Route Frequency Provider Last Rate Last Dose  . DISCONTD: 0.9 %  sodium chloride infusion  500 mL Intravenous Continuous Eliezer Bottom., MD,FACG        BP 146/90  Pulse 76  Temp 98.2 F (36.8 C)  Resp 16  Ht 5\' 2"  (1.575 m)  Wt 156 lb (70.761 kg)  BMI 28.53 kg/m2       Objective:   Physical Exam  Constitutional: She is oriented to person, place, and time. She appears well-developed and well-nourished. No distress.  HENT:  Head: Normocephalic and atraumatic.  Right Ear: External ear normal.  Left Ear: External ear normal.  Nose: Nose normal.    Mouth/Throat: Oropharynx is clear and moist.  Eyes: Conjunctivae and EOM are normal. Pupils are equal, round, and reactive to light.  Neck: Normal range of motion. Neck supple. No JVD present. No tracheal deviation present. No thyromegaly present.  Cardiovascular: Normal rate, regular rhythm, normal heart sounds and intact distal pulses.   No murmur heard. Pulmonary/Chest: Effort normal and breath sounds normal. She has no wheezes. She exhibits no tenderness.  Abdominal: Soft. Bowel sounds are normal.  Musculoskeletal: Normal range of motion. She exhibits no edema and no tenderness.  Lymphadenopathy:    She has no cervical adenopathy.  Neurological: She is alert and oriented to person, place, and time. She has normal reflexes. No cranial nerve deficit.  Skin: Skin is warm and dry. She is not diaphoretic.  Psychiatric: She has a normal mood and affect. Her behavior is normal.          Assessment & Plan:  Since seen with elevated PTH that has progressively elevated and x-ray evidence of her hands to suggest parathyroid disease she has an appointment in 2 weeks with the endocrinologist and I am going to go ahead and make an appointment with a general surgeon due to the activity on her nuclear medicine scan.  As well as her symptomology which she has increased bone pain, vision abnormalities, fatigue A renal panel redrawn today to make sure that there is no secondary cause of hyperparathyroidism such as progressive renal disease

## 2011-12-15 LAB — BASIC METABOLIC PANEL
Calcium: 9.2 mg/dL (ref 8.4–10.5)
GFR: 50.77 mL/min — ABNORMAL LOW (ref >60.00)
Sodium: 140 mEq/L (ref 135–145)

## 2011-12-24 ENCOUNTER — Ambulatory Visit: Payer: Medicare Other | Admitting: Endocrinology

## 2012-01-08 ENCOUNTER — Encounter: Payer: Self-pay | Admitting: Endocrinology

## 2012-01-08 ENCOUNTER — Ambulatory Visit (INDEPENDENT_AMBULATORY_CARE_PROVIDER_SITE_OTHER): Payer: Medicare Other | Admitting: Endocrinology

## 2012-01-08 DIAGNOSIS — E213 Hyperparathyroidism, unspecified: Secondary | ICD-10-CM

## 2012-01-08 MED ORDER — CALCITRIOL 0.25 MCG PO CAPS: 0.2500 ug | ORAL_CAPSULE | ORAL | Status: DC

## 2012-01-08 MED ORDER — CALCITRIOL 0.25 MCG PO CAPS: 0.2500 ug | ORAL_CAPSULE | Freq: Every day | ORAL | Status: DC

## 2012-01-08 NOTE — Patient Instructions (Addendum)
i have sent a prescription to your pharmacy, for a prescription form of vitamin-d.

## 2012-01-08 NOTE — Progress Notes (Signed)
Subjective:    Patient ID: Felicia Perry, female    DOB: [birth year], 66 y.o.   MRN: [id number]  HPI Pt states few years of intermittent moderate pain at the legs, but no assoc numbness.   she fx a finger a few mos ago.  X-ray was suggestive of hyperparathyroidism.  No h/o urolithiasis, but she has h/o PUD.   Past Medical History  Diagnosis Date  . Anxiety   . Asthma   . Hypertension   . Arthritis   . Diverticulosis   . Low back pain   . History of shingles   . PUD (peptic ulcer disease)   . Internal hemorrhoids   . Adenomatous polyp 11/23/2005  . GERD (gastroesophageal reflux disease)     Past Surgical History  Procedure Date  . 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 Marital Status: Divorced    Spouse Name: N/A    Number of Children: N/A  . Years of Education: N/A   Occupational History  . Cleaning    Social History Main Topics  . Smoking status: Former Games developer  . Smokeless tobacco: Never Used  . Alcohol Use: No  . Drug Use: No  . Sexually Active: Yes   Other Topics Concern  . Not on file   Social History Narrative   3 glasses of tea daily     Current Outpatient Prescriptions on File Prior to Visit  Medication Sig Dispense Refill  . ALPRAZolam (XANAX) 0.5 MG tablet Take 1 tablet (0.5 mg total) by mouth 3 (three) times daily as needed for sleep.  90 tablet  4  . citalopram (CELEXA) 10 MG tablet Take 1 tablet (10 mg total) by mouth daily.  30 tablet  6  . fexofenadine-pseudoephedrine (ALLEGRA-D 24) 180-240 MG per 24 hr tablet Take 1 tablet by mouth daily.        . fish oil-omega-3 fatty acids 1000 MG capsule Take by mouth daily. Once daily      . ibuprofen (MOTRIN IB) 200 MG tablet Take 200 mg by mouth. As needed        . levocetirizine (XYZAL) 5 MG tablet Take 1 tablet (5 mg total) by mouth every evening.  60 tablet  2  . NEXIUM 40 MG capsule take 1 capsule by mouth once daily  30 capsule  1  .  Olmesartan-Amlodipine-HCTZ (TRIBENZOR) 40-5-12.5 MG TABS Take 1 capsule by mouth daily.  30 tablet    . Omega-3 Fatty Acids (FISH OIL BURP-LESS) 500 MG CAPS Take 3 capsules by mouth daily.          Allergies  Allergen Reactions  . Biaxin   . Cephalosporins     REACTION: tongue swelling  . Clarithromycin     REACTION: blisters in mouth  . Doxycycline Hyclate     REACTION: nausea, vomiting  . Levofloxacin     REACTION: tongue swells  . Penicillins     REACTION: per patient causes rash,hives  . Zolpidem Tartrate     REACTION: difficulty with concentration    Family History  Problem Relation Age of Onset  . Heart disease Mother   . Hypertension Mother   . Diabetes Mother   . COPD Father   . Colon cancer Neg Hx   no calcium or parathyroid problems  BP 112/74  Pulse 55  Temp(Src) 98.6 F (37 C) (Oral)  Wt 152 lb (68.947 kg)  SpO2  97%   Review of Systems denies muscle weakness, vomiting, weight gain, syncope, fatigue, uterine bleeding, palpitations, rash, diarrhea, sob, numbness, fever, and urinary frequency.  She reports blurry vision, easy bruising, rhinorrhea, leg cramps, nausea, and bilat hand pain.      Objective:   Physical Exam VS: see vs page GEN: no distress HEAD: head: no deformity eyes: no periorbital swelling, no proptosis external nose and ears are normal mouth: no lesion seen NECK: supple, thyroid is not enlarged CHEST WALL: no deformity LUNGS:  Clear to auscultation CV: reg rate and rhythm, no murmur ABD: abdomen is soft, nontender.  no hepatosplenomegaly.  not distended.  no hernia.   MUSCULOSKELETAL: muscle bulk and strength are grossly normal.  no obvious joint swelling.  gait is normal and steady EXTEMITIES: no deformity of the hands. no edema of the legs.   PULSES: dorsalis pedis intact bilat.   NEURO:  cn 2-12 grossly intact.   readily moves all 4's.  sensation is intact to touch on the feet SKIN:  Normal texture and temperature.  No rash or  suspicious lesion is visible.   NODES:  None palpable at the neck PSYCH: alert, oriented x3.  Does not appear anxious nor depressed.     Assessment & Plan:  Hyper[arathyroidism, mild.  It is probably secondary HTN: the HCTZ component of the tribenzor may be keeping the Ca++ level higher than it would otherwise be.  GERD.  Despite the fact that this could contribute to the hyperparathyroidism, she should continue this med.

## 2012-01-11 ENCOUNTER — Ambulatory Visit (INDEPENDENT_AMBULATORY_CARE_PROVIDER_SITE_OTHER): Payer: Medicare Other | Admitting: Surgery

## 2012-01-18 ENCOUNTER — Telehealth: Payer: Self-pay | Admitting: Internal Medicine

## 2012-01-18 DIAGNOSIS — I10 Essential (primary) hypertension: Secondary | ICD-10-CM

## 2012-01-18 MED ORDER — OLMESARTAN-AMLODIPINE-HCTZ 40-5-12.5 MG PO TABS: 1.0000 | ORAL_TABLET | Freq: Every day | ORAL | Status: DC

## 2012-01-18 NOTE — Telephone Encounter (Signed)
30 day rx sent in electronically

## 2012-01-18 NOTE — Telephone Encounter (Signed)
Patient called stating that she need an rx for tribenzor sent to [facility], [address]. Please assist.

## 2012-02-12 ENCOUNTER — Ambulatory Visit: Payer: Medicare Other | Admitting: Internal Medicine

## 2012-02-16 ENCOUNTER — Ambulatory Visit (INDEPENDENT_AMBULATORY_CARE_PROVIDER_SITE_OTHER): Payer: Medicare Other | Admitting: Internal Medicine

## 2012-02-16 ENCOUNTER — Encounter: Payer: Self-pay | Admitting: Internal Medicine

## 2012-02-16 VITALS — BP 144/80 | HR 72 | Temp 98.2°F | Resp 16 | Ht 62.0 in | Wt 155.0 lb

## 2012-02-16 DIAGNOSIS — F419 Anxiety disorder, unspecified: Secondary | ICD-10-CM

## 2012-02-16 DIAGNOSIS — L2089 Other atopic dermatitis: Secondary | ICD-10-CM

## 2012-02-16 DIAGNOSIS — I10 Essential (primary) hypertension: Secondary | ICD-10-CM

## 2012-02-16 DIAGNOSIS — E559 Vitamin D deficiency, unspecified: Secondary | ICD-10-CM

## 2012-02-16 DIAGNOSIS — L209 Atopic dermatitis, unspecified: Secondary | ICD-10-CM

## 2012-02-16 DIAGNOSIS — F411 Generalized anxiety disorder: Secondary | ICD-10-CM

## 2012-02-16 DIAGNOSIS — E213 Hyperparathyroidism, unspecified: Secondary | ICD-10-CM

## 2012-02-16 MED ORDER — DESONIDE 0.05 % EX CREA: TOPICAL_CREAM | Freq: Two times a day (BID) | CUTANEOUS | Status: DC

## 2012-02-16 MED ORDER — ALPRAZOLAM 2 MG PO TABS: 2.0000 mg | ORAL_TABLET | Freq: Every evening | ORAL | Status: DC | PRN

## 2012-02-16 MED ORDER — OLMESARTAN-AMLODIPINE-HCTZ 40-5-12.5 MG PO TABS: 1.0000 | ORAL_TABLET | Freq: Every day | ORAL | Status: DC

## 2012-02-16 NOTE — Progress Notes (Signed)
Subjective:    Patient ID: Felicia Perry, female    DOB: August 07, 1946, 66 y.o.   MRN: [id number]  HPI Felicia Perry is followed for secondary hyperparathyroidism.  She was referred to endocrinology following x-ray evidence of hyperparathyroidism and confirmation of an elevated calcium and PTH as well as a suggestion on a thyroid scan that she had increased parathyroid gland.  She had symptoms of moans groans and bone pain and was found to have a low vitamin D suggesting secondary hyperparathyroidism.  She is on vitamin D replacement and feels like she is improving.  Her chief complaint today is a rash on her left shoulder that is pleuritic in nature. She denies pain at the site of the rash and states that she has excoriated the rash causing some bleeding   Review of Systems  Constitutional: Negative for activity change, appetite change and fatigue.  HENT: Negative for ear pain, congestion, neck pain, postnasal drip and sinus pressure.   Eyes: Negative for redness and visual disturbance.  Respiratory: Negative for cough, shortness of breath and wheezing.   Gastrointestinal: Negative for abdominal pain and abdominal distention.  Genitourinary: Negative for dysuria, frequency and menstrual problem.  Musculoskeletal: Negative for myalgias, joint swelling and arthralgias.  Skin: Negative for rash and wound.  Neurological: Negative for dizziness, weakness and headaches.  Hematological: Negative for adenopathy. Does not bruise/bleed easily.  Psychiatric/Behavioral: Negative for sleep disturbance and decreased concentration.   Past Medical History  Diagnosis Date  . Anxiety   . Asthma   . Hypertension   . Arthritis   . Diverticulosis   . Low back pain   . History of shingles   . PUD (peptic ulcer disease)   . Internal hemorrhoids   . Adenomatous polyp 11/23/2005  . GERD (gastroesophageal reflux disease)     History   Social History  . Marital Status: Divorced    Spouse Name: N/A      Number of Children: N/A  . Years of Education: N/A   Occupational History  . Cleaning    Social History Main Topics  . Smoking status: Former Games developer  . Smokeless tobacco: Never Used  . Alcohol Use: No  . Drug Use: No  . Sexually Active: Yes   Other Topics Concern  . Not on file   Social History Narrative   3 glasses of tea daily     Past Surgical History  Procedure Date  . 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 Heart disease Mother   . Hypertension Mother   . Diabetes Mother   . COPD Father   . Colon cancer Neg Hx     Allergies  Allergen Reactions  . Biaxin   . Cephalosporins     REACTION: tongue swelling  . Clarithromycin     REACTION: blisters in mouth  . Doxycycline Hyclate     REACTION: nausea, vomiting  . Levofloxacin     REACTION: tongue swells  . Penicillins     REACTION: per patient causes rash,hives  . Zolpidem Tartrate     REACTION: difficulty with concentration    Current Outpatient Prescriptions on File Prior to Visit  Medication Sig Dispense Refill  . ALPRAZolam (XANAX) 0.5 MG tablet Take 1 tablet (0.5 mg total) by mouth 3 (three) times daily as needed for sleep.  90 tablet  4  . calcitRIOL (ROCALTROL) 0.25 MCG capsule Take 1  capsule (0.25 mcg total) by mouth 3 (three) times a week. lease cancel rx just sent for qd  15 capsule  11  . citalopram (CELEXA) 10 MG tablet Take 1 tablet (10 mg total) by mouth daily.  30 tablet  6  . fexofenadine-pseudoephedrine (ALLEGRA-D 24) 180-240 MG per 24 hr tablet Take 1 tablet by mouth daily.        . fish oil-omega-3 fatty acids 1000 MG capsule Take by mouth daily. Once daily      . ibuprofen (MOTRIN IB) 200 MG tablet Take 200 mg by mouth. As needed        . levocetirizine (XYZAL) 5 MG tablet Take 1 tablet (5 mg total) by mouth every evening.  60 tablet  2  . NEXIUM 40 MG capsule take 1 capsule by mouth once daily  30 capsule  1  .  Olmesartan-Amlodipine-HCTZ (TRIBENZOR) 40-5-12.5 MG TABS Take 1 capsule by mouth daily.  30 tablet  0  . Omega-3 Fatty Acids (FISH OIL BURP-LESS) 500 MG CAPS Take 3 capsules by mouth daily.          BP 144/80  Pulse 72  Temp 98.2 F (36.8 C)  Resp 16  Ht 5\' 2"  (1.575 m)  Wt 155 lb (70.308 kg)  BMI 28.35 kg/m2       Objective:   Physical Exam  Constitutional: She is oriented to person, place, and time. She appears well-developed and well-nourished. No distress.  HENT:  Head: Normocephalic and atraumatic.  Right Ear: External ear normal.  Left Ear: External ear normal.  Nose: Nose normal.  Mouth/Throat: Oropharynx is clear and moist.  Eyes: Conjunctivae and EOM are normal. Pupils are equal, round, and reactive to light.  Neck: Normal range of motion. Neck supple. No JVD present. No tracheal deviation present. No thyromegaly present.  Cardiovascular: Normal rate, regular rhythm, normal heart sounds and intact distal pulses.   No murmur heard. Pulmonary/Chest: Effort normal and breath sounds normal. She has no wheezes. She exhibits no tenderness.  Abdominal: Soft. Bowel sounds are normal.  Musculoskeletal: Normal range of motion. She exhibits no edema and no tenderness.  Lymphadenopathy:    She has no cervical adenopathy.  Neurological: She is alert and oriented to person, place, and time. She has normal reflexes. No cranial nerve deficit.  Skin: Skin is warm and dry. She is not diaphoretic.  Psychiatric: She has a normal mood and affect. Her behavior is normal.          Assessment & Plan:  Contact dermatitis of unknown etiology on right shoulder.  DesOwen ointment is given to the patient as a trial to apply twice a day till milliunits symptoms.  Secondary hyperparathyroidism with vitamin D replacement that strategy and followup with endocrinology as appropriate.  Monitoring her parathyroid calcium and vitamin D after she's been on therapy for 2 months return office  visit in one month

## 2012-02-19 ENCOUNTER — Other Ambulatory Visit: Payer: Self-pay | Admitting: Internal Medicine

## 2012-02-19 ENCOUNTER — Encounter: Payer: Self-pay | Admitting: Endocrinology

## 2012-02-19 ENCOUNTER — Ambulatory Visit (INDEPENDENT_AMBULATORY_CARE_PROVIDER_SITE_OTHER): Payer: Medicare Other | Admitting: Endocrinology

## 2012-02-19 ENCOUNTER — Other Ambulatory Visit: Payer: Medicare Other

## 2012-02-19 DIAGNOSIS — E213 Hyperparathyroidism, unspecified: Secondary | ICD-10-CM

## 2012-02-19 NOTE — Patient Instructions (Addendum)
blood tests are being requested for you today.  You will receive a letter with results.  

## 2012-02-19 NOTE — Progress Notes (Signed)
Subjective:    Patient ID: Felicia Perry, female    DOB: [birth year], 66 y.o.   MRN: [id number]  HPI Pt returns for f/u of mild secondary hyperparathyroidism.  On the rocaltrol, she has improvement in her cramps.   Past Medical History  Diagnosis Date  . Anxiety   . Asthma   . Hypertension   . Arthritis   . Diverticulosis   . Low back pain   . History of shingles   . PUD (peptic ulcer disease)   . Internal hemorrhoids   . Adenomatous polyp 11/23/2005  . GERD (gastroesophageal reflux disease)     Past Surgical History  Procedure Date  . 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 Marital Status: Divorced    Spouse Name: N/A    Number of Children: N/A  . Years of Education: N/A   Occupational History  . Cleaning    Social History Main Topics  . Smoking status: Former Games developer  . Smokeless tobacco: Never Used  . Alcohol Use: No  . Drug Use: No  . Sexually Active: Yes   Other Topics Concern  . Not on file   Social History Narrative   3 glasses of tea daily     Current Outpatient Prescriptions on File Prior to Visit  Medication Sig Dispense Refill  . ALPRAZolam (XANAX) 2 MG tablet Take 1 tablet (2 mg total) by mouth at bedtime as needed for sleep.  30 tablet  4  . calcitRIOL (ROCALTROL) 0.25 MCG capsule Take 1 capsule (0.25 mcg total) by mouth 3 (three) times a week. lease cancel rx just sent for qd  15 capsule  11  . citalopram (CELEXA) 10 MG tablet Take 1 tablet (10 mg total) by mouth daily.  30 tablet  6  . desonide (DESOWEN) 0.05 % cream Apply topically 2 (two) times daily.  30 g  0  . fexofenadine-pseudoephedrine (ALLEGRA-D 24) 180-240 MG per 24 hr tablet Take 1 tablet by mouth daily.        . fish oil-omega-3 fatty acids 1000 MG capsule Take by mouth daily. Once daily      . ibuprofen (MOTRIN IB) 200 MG tablet Take 200 mg by mouth. As needed        . levocetirizine (XYZAL) 5 MG tablet Take 1 tablet (5 mg total)  by mouth every evening.  60 tablet  2  . nadolol (CORGARD) 40 MG tablet Take 40 mg by mouth daily.      Marland Kitchen NEXIUM 40 MG capsule take 1 capsule by mouth once daily  30 capsule  1  . Olmesartan-Amlodipine-HCTZ (TRIBENZOR) 40-5-12.5 MG TABS Take 1 capsule by mouth daily.  30 tablet  11  . Omega-3 Fatty Acids (FISH OIL BURP-LESS) 500 MG CAPS Take 3 capsules by mouth daily.          Allergies  Allergen Reactions  . Biaxin   . Cephalosporins     REACTION: tongue swelling  . Clarithromycin     REACTION: blisters in mouth  . Doxycycline Hyclate     REACTION: nausea, vomiting  . Levofloxacin     REACTION: tongue swells  . Penicillins     REACTION: per patient causes rash,hives  . Zolpidem Tartrate     REACTION: difficulty with concentration    Family History  Problem Relation Age of Onset  . Heart disease Mother   . Hypertension Mother   .  Diabetes Mother   . COPD Father   . Colon cancer Neg Hx     BP 124/88  Pulse 58  Temp(Src) 98.7 F (37.1 C) (Oral)  Wt 150 lb (68.04 kg)  SpO2 97%    Review of Systems Denies     Objective:   Physical Exam VITAL SIGNS:  See vs page GENERAL: no distress Hands: no deformity, except for OA changes   Lab Results  Component Value Date   PTH 54.7 02/19/2012   CALCIUM 9.9 02/19/2012      Assessment & Plan:  Secondary hyperparathyroidism, improved

## 2012-02-22 ENCOUNTER — Encounter: Payer: Self-pay | Admitting: Endocrinology

## 2012-02-23 ENCOUNTER — Telehealth: Payer: Self-pay | Admitting: *Deleted

## 2012-02-23 NOTE — Telephone Encounter (Signed)
Called pt to inform of lab results, left message for pt to callback office (letter also mailed to pt). 

## 2012-02-24 NOTE — Telephone Encounter (Signed)
Left message for pt to callback office.  

## 2012-02-25 NOTE — Telephone Encounter (Signed)
Pt informed of lab results. 

## 2012-04-25 ENCOUNTER — Ambulatory Visit: Payer: Medicare Other | Admitting: Internal Medicine

## 2012-05-05 ENCOUNTER — Other Ambulatory Visit: Payer: Self-pay | Admitting: Internal Medicine

## 2012-05-13 ENCOUNTER — Encounter: Payer: Self-pay | Admitting: Internal Medicine

## 2012-05-13 ENCOUNTER — Ambulatory Visit (INDEPENDENT_AMBULATORY_CARE_PROVIDER_SITE_OTHER): Payer: Medicare Other | Admitting: Internal Medicine

## 2012-05-13 VITALS — BP 132/80 | HR 72 | Temp 98.1°F | Resp 16 | Ht 62.0 in | Wt 162.0 lb

## 2012-05-13 DIAGNOSIS — E210 Primary hyperparathyroidism: Secondary | ICD-10-CM

## 2012-05-13 DIAGNOSIS — I10 Essential (primary) hypertension: Secondary | ICD-10-CM

## 2012-05-13 MED ORDER — CALCITRIOL 0.25 MCG PO CAPS: 0.2500 ug | ORAL_CAPSULE | ORAL | Status: DC

## 2012-05-13 MED ORDER — PHENTERMINE HCL 37.5 MG PO CAPS: 37.5000 mg | ORAL_CAPSULE | ORAL | Status: DC

## 2012-05-13 NOTE — Patient Instructions (Addendum)
The patient is instructed to continue all medications as prescribed. Schedule followup with check out clerk upon leaving the clinic  

## 2012-05-13 NOTE — Progress Notes (Signed)
Subjective:    Patient ID: Felicia Perry, female    DOB: 03/19/1946, 66 y.o.   MRN: [id number]  HPI  Patient is a 66 year old female is followed for hypertension asthma history of osteoarthritis and history of low vitamin D levels creating a secondary  Hyperparathyroid syndrome. Patient is now on vitamin D doing well blood pressure stable weight is increased and we discussed today weight loss strategies including a gluten-free diet  Review of Systems  Constitutional: Negative for activity change, appetite change and fatigue.  HENT: Negative for ear pain, congestion, neck pain, postnasal drip and sinus pressure.   Eyes: Negative for redness and visual disturbance.  Respiratory: Negative for cough, shortness of breath and wheezing.   Gastrointestinal: Negative for abdominal pain and abdominal distention.  Genitourinary: Negative for dysuria, frequency and menstrual problem.  Musculoskeletal: Negative for myalgias, joint swelling and arthralgias.  Skin: Negative for rash and wound.  Neurological: Negative for dizziness, weakness and headaches.  Hematological: Negative for adenopathy. Does not bruise/bleed easily.  Psychiatric/Behavioral: Negative for disturbed wake/sleep cycle and decreased concentration.   Past Medical History  Diagnosis Date  . Anxiety   . Asthma   . Hypertension   . Arthritis   . Diverticulosis   . Low back pain   . History of shingles   . PUD (peptic ulcer disease)   . Internal hemorrhoids   . Adenomatous polyp 11/23/2005  . GERD (gastroesophageal reflux disease)     History   Social History  . Marital Status: Divorced    Spouse Name: N/A    Number of Children: N/A  . Years of Education: N/A   Occupational History  . Cleaning    Social History Main Topics  . Smoking status: Former Games developer  . Smokeless tobacco: Never Used  . Alcohol Use: No  . Drug Use: No  . Sexually Active: Yes   Other Topics Concern  . Not on file   Social History  Narrative   3 glasses of tea daily     Past Surgical History  Procedure Date  . 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 Heart disease Mother   . Hypertension Mother   . Diabetes Mother   . COPD Father   . Colon cancer Neg Hx     Allergies  Allergen Reactions  . Cephalosporins     REACTION: tongue swelling  . Clarithromycin     REACTION: blisters in mouth  . Clarithromycin   . Doxycycline Hyclate     REACTION: nausea, vomiting  . Levofloxacin     REACTION: tongue swells  . Penicillins     REACTION: per patient causes rash,hives  . Zolpidem Tartrate     REACTION: difficulty with concentration    Current Outpatient Prescriptions on File Prior to Visit  Medication Sig Dispense Refill  . ALPRAZolam (XANAX) 2 MG tablet Take 1 tablet (2 mg total) by mouth at bedtime as needed for sleep.  30 tablet  4  . citalopram (CELEXA) 10 MG tablet Take 1 tablet (10 mg total) by mouth daily.  30 tablet  6  . desonide (DESOWEN) 0.05 % cream Apply topically 2 (two) times daily.  30 g  0  . fexofenadine-pseudoephedrine (ALLEGRA-D 24) 180-240 MG per 24 hr tablet Take 1 tablet by mouth daily.        Marland Kitchen ibuprofen (MOTRIN IB) 200 MG tablet Take 200 mg  by mouth. As needed        . levocetirizine (XYZAL) 5 MG tablet Take 1 tablet (5 mg total) by mouth every evening.  60 tablet  2  . nadolol (CORGARD) 40 MG tablet Take 40 mg by mouth daily.      Marland Kitchen NEXIUM 40 MG capsule take 1 capsule by mouth once daily  30 capsule  1  . Olmesartan-Amlodipine-HCTZ (TRIBENZOR) 40-5-12.5 MG TABS Take 1 capsule by mouth daily.  30 tablet  11  . phentermine 37.5 MG capsule Take 1 capsule (37.5 mg total) by mouth every morning.  30 capsule  2    BP 132/80  Pulse 72  Temp 98.1 F (36.7 C)  Resp 16  Ht 5\' 2"  (1.575 m)  Wt 162 lb (73.483 kg)  BMI 29.63 kg/m2       Objective:   Physical Exam  Nursing note and vitals  reviewed. Constitutional: She is oriented to person, place, and time. She appears well-developed and well-nourished. No distress.  HENT:  Head: Normocephalic and atraumatic.  Right Ear: External ear normal.  Left Ear: External ear normal.  Nose: Nose normal.  Mouth/Throat: Oropharynx is clear and moist.  Eyes: Conjunctivae and EOM are normal. Pupils are equal, round, and reactive to light.  Neck: Normal range of motion. Neck supple. No JVD present. No tracheal deviation present. No thyromegaly present.  Cardiovascular: Normal rate, regular rhythm, normal heart sounds and intact distal pulses.   No murmur heard. Pulmonary/Chest: Effort normal and breath sounds normal. She has no wheezes. She exhibits no tenderness.  Abdominal: Soft. Bowel sounds are normal.  Musculoskeletal: Normal range of motion. She exhibits no edema and no tenderness.  Lymphadenopathy:    She has no cervical adenopathy.  Neurological: She is alert and oriented to person, place, and time. She has normal reflexes. No cranial nerve deficit.  Skin: Skin is warm and dry. She is not diaphoretic.  Psychiatric: She has a normal mood and affect. Her behavior is normal.          Assessment & Plan:  Normal vitamin D levels Stable blood pressure Discussed titrating off the Nexium by starting taking it every other day while initiating a gluten-free diet and then hopefully being able to discontinue it after 2 weeks

## 2012-06-02 ENCOUNTER — Other Ambulatory Visit: Payer: Self-pay | Admitting: Internal Medicine

## 2012-07-07 ENCOUNTER — Ambulatory Visit (INDEPENDENT_AMBULATORY_CARE_PROVIDER_SITE_OTHER): Payer: Medicare Other

## 2012-07-07 DIAGNOSIS — Z23 Encounter for immunization: Secondary | ICD-10-CM

## 2012-07-12 ENCOUNTER — Other Ambulatory Visit: Payer: Self-pay | Admitting: Internal Medicine

## 2012-07-19 ENCOUNTER — Other Ambulatory Visit: Payer: Self-pay | Admitting: Internal Medicine

## 2012-08-12 ENCOUNTER — Ambulatory Visit: Payer: Medicare Other | Admitting: Internal Medicine

## 2012-08-19 ENCOUNTER — Ambulatory Visit (INDEPENDENT_AMBULATORY_CARE_PROVIDER_SITE_OTHER): Payer: Medicare Other | Admitting: Endocrinology

## 2012-08-19 ENCOUNTER — Encounter: Payer: Self-pay | Admitting: Endocrinology

## 2012-08-19 VITALS — BP 126/72 | HR 61 | Temp 97.7°F | Resp 16 | Wt 160.3 lb

## 2012-08-19 DIAGNOSIS — N2581 Secondary hyperparathyroidism of renal origin: Secondary | ICD-10-CM

## 2012-08-19 NOTE — Progress Notes (Signed)
Subjective:    Patient ID: Felicia Perry, female    DOB: 07-28-1946, 66 y.o.   MRN: [id number]  HPI Pt returns for f/u of mild secondary hyperparathyroidism.  This was found in 2012, when she fx a finger, and x-ray was suggestive of hyperparathyroidism.  pt states she feels well in general, except for weight gain. Past Medical History  Diagnosis Date  . Anxiety   . Asthma   . Hypertension   . Arthritis   . Diverticulosis   . Low back pain   . History of shingles   . PUD (peptic ulcer disease)   . Internal hemorrhoids   . Adenomatous polyp 11/23/2005  . GERD (gastroesophageal reflux disease)     Past Surgical History  Procedure Date  . 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 Marital Status: Divorced    Spouse Name: N/A    Number of Children: N/A  . Years of Education: N/A   Occupational History  . Cleaning    Social History Main Topics  . Smoking status: Former Games developer  . Smokeless tobacco: Never Used  . Alcohol Use: No  . Drug Use: No  . Sexually Active: Yes   Other Topics Concern  . Not on file   Social History Narrative   3 glasses of tea daily     Current Outpatient Prescriptions on File Prior to Visit  Medication Sig Dispense Refill  . alprazolam (XANAX) 2 MG tablet TAKE 1 TABLET BY MOUTH AT BEDTIME AS NEEDED FOR SLEEP  30 tablet  4  . calcitRIOL (ROCALTROL) 0.25 MCG capsule Take 1 capsule (0.25 mcg total) by mouth 3 (three) times a week.  15 capsule  11  . citalopram (CELEXA) 10 MG tablet take 1 tablet by mouth once daily  30 tablet  6  . desonide (DESOWEN) 0.05 % cream Apply topically 2 (two) times daily.  30 g  0  . fexofenadine-pseudoephedrine (ALLEGRA-D 24) 180-240 MG per 24 hr tablet Take 1 tablet by mouth daily.        Marland Kitchen ibuprofen (MOTRIN IB) 200 MG tablet Take 200 mg by mouth. As needed        . levocetirizine (XYZAL) 5 MG tablet Take 1 tablet (5 mg total) by mouth every evening.  60 tablet  2    . levocetirizine (XYZAL) 5 MG tablet take 1 tablet by mouth every evening  30 tablet  2  . nadolol (CORGARD) 40 MG tablet Take 40 mg by mouth daily.      . nadolol (CORGARD) 40 MG tablet take 1 tablet by mouth once daily  30 tablet  10  . NEXIUM 40 MG capsule take 1 capsule by mouth once daily  30 capsule  1  . Olmesartan-Amlodipine-HCTZ (TRIBENZOR) 40-5-12.5 MG TABS Take 1 capsule by mouth daily.  30 tablet  11  . phentermine 37.5 MG capsule Take 1 capsule (37.5 mg total) by mouth every morning.  30 capsule  2    Allergies  Allergen Reactions  . Cephalosporins     REACTION: tongue swelling  . Clarithromycin     REACTION: blisters in mouth  . Clarithromycin   . Doxycycline Hyclate     REACTION: nausea, vomiting  . Levofloxacin     REACTION: tongue swells  . Penicillins     REACTION: per patient causes rash,hives  . Zolpidem Tartrate     REACTION: difficulty with concentration  Family History  Problem Relation Age of Onset  . Heart disease Mother   . Hypertension Mother   . Diabetes Mother   . COPD Father   . Colon cancer Neg Hx     BP 126/72  Pulse 61  Temp 97.7 F (36.5 C) (Oral)  Resp 16  Wt 160 lb 5 oz (72.717 kg)  SpO2 94%    Review of Systems Denies cramps    Objective:   Physical Exam VITAL SIGNS:  See vs page GENERAL: no distress Hands: no deformity.       Assessment & Plan:  Secondary hyperparathyroidism, uncertain etiology.  She may need rocaltrol indefinitely

## 2012-08-19 NOTE — Patient Instructions (Addendum)
blood tests are being requested for you today.  You will be contacted with results. Please return in 1 year. 

## 2012-08-22 LAB — PTH, INTACT AND CALCIUM
Calcium, Total (PTH): 9.5 mg/dL (ref 8.4–10.5)
PTH: 87.2 pg/mL — ABNORMAL HIGH (ref 14.0–72.0)

## 2012-08-22 NOTE — Progress Notes (Signed)
Left message on machine pt to call if any questions 

## 2012-09-20 ENCOUNTER — Ambulatory Visit: Payer: Self-pay | Admitting: Internal Medicine

## 2012-09-23 ENCOUNTER — Ambulatory Visit: Payer: Medicare Other | Admitting: Internal Medicine

## 2012-10-07 ENCOUNTER — Ambulatory Visit (INDEPENDENT_AMBULATORY_CARE_PROVIDER_SITE_OTHER): Payer: Medicare Other | Admitting: Internal Medicine

## 2012-10-07 ENCOUNTER — Encounter: Payer: Self-pay | Admitting: Internal Medicine

## 2012-10-07 VITALS — BP 160/94 | HR 76 | Temp 98.3°F | Resp 16 | Ht 62.0 in | Wt 168.0 lb

## 2012-10-07 DIAGNOSIS — E559 Vitamin D deficiency, unspecified: Secondary | ICD-10-CM

## 2012-10-07 DIAGNOSIS — E215 Disorder of parathyroid gland, unspecified: Secondary | ICD-10-CM

## 2012-10-07 DIAGNOSIS — I10 Essential (primary) hypertension: Secondary | ICD-10-CM

## 2012-10-07 LAB — BASIC METABOLIC PANEL
BUN: 22 mg/dL (ref 6–23)
Calcium: 9.2 mg/dL (ref 8.4–10.5)
Creatinine, Ser: 1 mg/dL (ref 0.4–1.2)
GFR: 56.3 mL/min — ABNORMAL LOW (ref >60.00)

## 2012-10-07 MED ORDER — NADOLOL 40 MG PO TABS: 40.0000 mg | ORAL_TABLET | Freq: Every day | ORAL | Status: DC

## 2012-10-07 MED ORDER — CALCITRIOL 0.25 MCG PO CAPS: 0.2500 ug | ORAL_CAPSULE | ORAL | Status: DC

## 2012-10-07 MED ORDER — OLMESARTAN-AMLODIPINE-HCTZ 40-5-12.5 MG PO TABS: 1.0000 | ORAL_TABLET | Freq: Every day | ORAL | Status: DC

## 2012-10-07 NOTE — Patient Instructions (Addendum)
This is the approach to diet I recommend Don't look for prepared foods in the grocery store   Here are some of the shortcuts  Steam fresh vegetables in the frozen food section Individually wrapped chicken  in the  Meat section Rotisserie chicken Prepared foods at earth fare.... Scottish salmon, chicken and gluten-free meatloaf Frozen butternut squash Sweet potatoes wrapped for microwaving Almonds pistachio pecans and walnuts Any fruit Natural eggs Butter!!!!!  An omelette with smoked salmon and onions and peppers Steam fresh vegetable melody and a chicken breast for lunch Take a Baked sweet potato and asparagus dinner    Meals she can prepare and advance... sweet potato and grass fed hamburger the vadalia onions and peppers casseroles Stuffed green peppers  "paleo diet" GJ at the rush Friend[employer] across from 5 guys      Stop the celexa

## 2012-10-07 NOTE — Progress Notes (Signed)
Subjective:    Patient ID: Felicia Perry, female    DOB: 13-Dec-1945, 65 y.o.   MRN: [id number]  HPI  Followed for HTN and head aches Allergies GERD stable Mild leg cramps   Review of Systems  Constitutional: Negative for activity change, appetite change and fatigue.  HENT: Negative for ear pain, congestion, neck pain, postnasal drip and sinus pressure.   Eyes: Negative for redness and visual disturbance.  Respiratory: Negative for cough, shortness of breath and wheezing.   Gastrointestinal: Negative for abdominal pain and abdominal distention.  Genitourinary: Negative for dysuria, frequency and menstrual problem.  Musculoskeletal: Negative for myalgias, joint swelling and arthralgias.  Skin: Negative for rash and wound.  Neurological: Negative for dizziness, weakness and headaches.  Hematological: Negative for adenopathy. Does not bruise/bleed easily.  Psychiatric/Behavioral: Negative for sleep disturbance and decreased concentration.   Past Medical History  Diagnosis Date  . Anxiety   . Asthma   . Hypertension   . Arthritis   . Diverticulosis   . Low back pain   . History of shingles   . PUD (peptic ulcer disease)   . Internal hemorrhoids   . Adenomatous polyp 11/23/2005  . GERD (gastroesophageal reflux disease)     History   Social History  . Marital Status: Divorced    Spouse Name: N/A    Number of Children: N/A  . Years of Education: N/A   Occupational History  . Cleaning    Social History Main Topics  . Smoking status: Former Games developer  . Smokeless tobacco: Never Used  . Alcohol Use: No  . Drug Use: No  . Sexually Active: Yes   Other Topics Concern  . Not on file   Social History Narrative   3 glasses of tea daily     Past Surgical History  Procedure Date  . 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 Heart disease Mother   . Hypertension Mother   . Diabetes Mother   . COPD  Father   . Colon cancer Neg Hx     Allergies  Allergen Reactions  . Cephalosporins     REACTION: tongue swelling  . Clarithromycin     REACTION: blisters in mouth  . Clarithromycin   . Doxycycline Hyclate     REACTION: nausea, vomiting  . Levofloxacin     REACTION: tongue swells  . Penicillins     REACTION: per patient causes rash,hives  . Zolpidem Tartrate     REACTION: difficulty with concentration    Current Outpatient Prescriptions on File Prior to Visit  Medication Sig Dispense Refill  . alprazolam (XANAX) 2 MG tablet TAKE 1 TABLET BY MOUTH AT BEDTIME AS NEEDED FOR SLEEP  30 tablet  4  . ibuprofen (MOTRIN IB) 200 MG tablet Take 200 mg by mouth. As needed        . levocetirizine (XYZAL) 5 MG tablet take 1 tablet by mouth every evening  30 tablet  2  . nadolol (CORGARD) 40 MG tablet Take 1 tablet (40 mg total) by mouth daily.  30 tablet  11  . NEXIUM 40 MG capsule take 1 capsule by mouth once daily  30 capsule  1  . Olmesartan-Amlodipine-HCTZ (TRIBENZOR) 40-5-12.5 MG TABS Take 1 capsule by mouth daily.  30 tablet  11    BP 160/94  Pulse 76  Temp 98.3 F (36.8 C)  Resp 16  Ht 5\' 2"  (1.575 m)  Wt 168 lb (76.204 kg)  BMI 30.73 kg/m2        Objective:   Physical Exam  Constitutional: She is oriented to person, place, and time. She appears well-developed and well-nourished. No distress.  HENT:  Head: Normocephalic and atraumatic.  Right Ear: External ear normal.  Left Ear: External ear normal.  Nose: Nose normal.  Mouth/Throat: Oropharynx is clear and moist.  Eyes: Conjunctivae normal and EOM are normal. Pupils are equal, round, and reactive to light.  Neck: Normal range of motion. Neck supple. No JVD present. No tracheal deviation present. No thyromegaly present.  Cardiovascular: Normal rate, regular rhythm, normal heart sounds and intact distal pulses.   No murmur heard. Pulmonary/Chest: Effort normal and breath sounds normal. She has no wheezes. She exhibits  no tenderness.  Abdominal: Soft. Bowel sounds are normal.  Musculoskeletal: Normal range of motion. She exhibits no edema and no tenderness.  Lymphadenopathy:    She has no cervical adenopathy.  Neurological: She is alert and oriented to person, place, and time. She has normal reflexes. No cranial nerve deficit.  Skin: Skin is warm and dry. She is not diaphoretic.  Psychiatric: She has a normal mood and affect. Her behavior is normal.          Assessment & Plan:  Worsening of her secondary hyperparathyroidism increase the Calcitrol to 4 times a week.  Stable on her other problems Views gluten-free diet with the patient and gave the patient dietary instructions  I have spent more than 30 minutes examining this patient face-to-face of which over half was spent in counseling

## 2012-10-07 NOTE — Addendum Note (Signed)
Addended by: Willy Eddy on: 10/07/2012 04:00 PM   Modules accepted: Orders

## 2012-11-04 ENCOUNTER — Telehealth: Payer: Self-pay | Admitting: Internal Medicine

## 2012-11-04 ENCOUNTER — Encounter: Payer: Self-pay | Admitting: Family

## 2012-11-04 ENCOUNTER — Ambulatory Visit (INDEPENDENT_AMBULATORY_CARE_PROVIDER_SITE_OTHER): Payer: Medicare Other | Admitting: Family

## 2012-11-04 ENCOUNTER — Ambulatory Visit: Payer: Self-pay | Admitting: Family

## 2012-11-04 VITALS — BP 140/80 | HR 78 | Temp 98.1°F | Wt 171.0 lb

## 2012-11-04 DIAGNOSIS — R635 Abnormal weight gain: Secondary | ICD-10-CM

## 2012-11-04 DIAGNOSIS — R079 Chest pain, unspecified: Secondary | ICD-10-CM

## 2012-11-04 DIAGNOSIS — I10 Essential (primary) hypertension: Secondary | ICD-10-CM

## 2012-11-04 DIAGNOSIS — E213 Hyperparathyroidism, unspecified: Secondary | ICD-10-CM

## 2012-11-04 LAB — CBC WITH DIFFERENTIAL/PLATELET
Basophils Absolute: 0.1 10*3/uL (ref 0.0–0.1)
HCT: 42.5 % (ref 36.0–46.0)
Lymphocytes Relative: 37.8 % (ref 12.0–46.0)
Lymphs Abs: 3.2 10*3/uL (ref 0.7–4.0)
Monocytes Relative: 10.6 % (ref 3.0–12.0)
Neutrophils Relative %: 45.9 % (ref 43.0–77.0)
Platelets: 288 10*3/uL (ref 150.0–400.0)
RDW: 14.4 % (ref 11.5–14.6)

## 2012-11-04 LAB — TSH: TSH: 0.61 u[IU]/mL (ref 0.35–5.50)

## 2012-11-04 NOTE — Telephone Encounter (Signed)
[name], please see note below. The appointment that was scheduled needs to be cancelled, because it is in an injection slot. Please let me know where/when/how you want to proceed with an OV for this pt. Thanks!  [facility] Triage Call Report Triage Record Num: [id number] Operator: Ether Griffins Patient Name: Felicia Perry Call Date & Time: 11/03/2012 5:28:57PM Patient Phone: [phone] PCP: Darryll Capers Patient Gender: Female PCP Fax : [phone] Patient DOB: February 06, 1946 [facility] Name[facility] Reason for Call: Caller: [name]/Patient; PCP: Darryll Capers (Adults only); CB#: [phone]; Calling about gained 40 lbs since October,ankles are swollen,hard to urinate,muscles twitch and joints ache,dizziness,chest pain,SOB. Has had sxs for a few months and has talked to Dr Lovell Sheehan about it--he advised a diet that has not helped. Stopped Celexa 1 month ago,and started on Tribenzor 9 months ago. Pt thinks that it is the Tribenzor that is causing her problems. Guideline: Chest Pain. Disposition: Activate EMS 911. Reason for Disposition: Pressure,fullness,sueezing sensation or pain anywhere in the chest lasting 5 or more minutes now or within the last hour. Pain is not associated with taking a deep breath or a productive cough,movement,or touch to a localized area on the chest. Pt is very scared and upset that she has gotten so big but refuses to call an ambulance or go to the ED because these sxs have been going on for months. Appt scheduled on 11/07/12 @ 1200 with Dr Jenkins(earliest appt available with Dr Esmond Camper). Advised her to call office back if any worsening sxs. Protocol(s) Used: Chest Pain Recommended Outcome per Protocol: Activate EMS 911 Reason for Outcome: Pressure, fullness, squeezing sensation or pain anywhere in the chest lasting 5 or more minutes now or within the last hour. Pain is NOT associated with taking a deep breath or a productive cough, movement, or touch to  a localized area on the chest. Care Advice: ~ 11/03/2012 6:01:31PM Page 1 of 1 [id number]

## 2012-11-04 NOTE — Progress Notes (Signed)
Subjective:    Patient ID: Felicia Perry, female    DOB: Apr 23, 1946, 67 y.o.   MRN: [id number]  HPI 67 year old white female, nonsmoker, patient of Dr. Lovell Sheehan is in today with concerns of weight gain. Reports gaining 40 pounds over the last 6 months. After reviewing her records patient has gained about 38 pounds over the last 2 years. She feels that she is swollen in her hands, legs, and abdomen. Patient reports feeling more depressed and anxious since the death of her mother. She's been on Celexa in the past that caused even more rage. Patient reports a history of rage and her as well as her son does not require treatment in the past. Patient reports being angry enough to chase down the car to curse people out. She does not believe she needs an anti-anxiety or antidepressant anymore. 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 Her business has recently become a bit more stressful having to employ a part-time employee to help her keep of the demands of her job. Patient does not have any siblings, is unmarried, and not the family support. She reports only the pistol in the past but has gotten rid of it because she was afraid of what she or son might do in an act or rage.  The patient reports chest pain that occurs daily lasting about 15 minutes. Pain is a 5/10. Tender to palpation. Denies any shortness of breath, nausea, vomiting, diaphoresis. It is not aggravated with activity. Denies any lightheadedness, or dizziness.   She has a history of hyper-parathyroidism is being managed by endocrinology.  Review of Systems  Constitutional: Positive for fatigue and unexpected weight change. Negative for appetite change.  HENT: Negative.   Eyes: Negative.   Respiratory: Negative.   Cardiovascular: Positive for chest pain.  Gastrointestinal: Negative.   Genitourinary: Negative.   Musculoskeletal: Negative.   Skin: Negative.   Neurological: Negative.   Hematological: Negative.     Psychiatric/Behavioral: The patient is nervous/anxious.    Past Medical History  Diagnosis Date  . Anxiety   . Asthma   . Hypertension   . Arthritis   . Diverticulosis   . Low back pain   . History of shingles   . PUD (peptic ulcer disease)   . Internal hemorrhoids   . Adenomatous polyp 11/23/2005  . GERD (gastroesophageal reflux disease)     History   Social History  . Marital Status: Divorced    Spouse Name: N/A    Number of Children: N/A  . Years of Education: N/A   Occupational History  . Cleaning    Social History Main Topics  . Smoking status: Former Games developer  . Smokeless tobacco: Never Used  . Alcohol Use: No  . Drug Use: No  . Sexually Active: Yes   Other Topics Concern  . Not on file   Social History Narrative   3 glasses of tea daily     Past Surgical History  Procedure Date  . 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 Heart disease Mother   . Hypertension Mother   . Diabetes Mother   . COPD Father   . Colon cancer Neg Hx     Allergies  Allergen Reactions  . Cephalosporins     REACTION: tongue swelling  . Clarithromycin     REACTION: blisters in mouth  . Clarithromycin   .  Doxycycline Hyclate     REACTION: nausea, vomiting  . Levofloxacin     REACTION: tongue swells  . Penicillins     REACTION: per patient causes rash,hives  . Zolpidem Tartrate     REACTION: difficulty with concentration    Current Outpatient Prescriptions on File Prior to Visit  Medication Sig Dispense Refill  . alprazolam (XANAX) 2 MG tablet TAKE 1 TABLET BY MOUTH AT BEDTIME AS NEEDED FOR SLEEP  30 tablet  4  . calcitRIOL (ROCALTROL) 0.25 MCG capsule Take 1 capsule (0.25 mcg total) by mouth 4 (four) times a week.  20 capsule  11  . ibuprofen (MOTRIN IB) 200 MG tablet Take 200 mg by mouth. As needed        . levocetirizine (XYZAL) 5 MG tablet take 1 tablet by mouth every evening  30 tablet  2   . nadolol (CORGARD) 40 MG tablet Take 1 tablet (40 mg total) by mouth daily.  30 tablet  11  . NEXIUM 40 MG capsule take 1 capsule by mouth once daily  30 capsule  1  . Olmesartan-Amlodipine-HCTZ (TRIBENZOR) 40-5-12.5 MG TABS Take 1 capsule by mouth daily.  30 tablet  11    BP 140/80  Pulse 78  Temp 98.1 F (36.7 C) (Oral)  Wt 171 lb (77.565 kg)  SpO2 97%chart    Objective:   Physical Exam  Constitutional: She is oriented to person, place, and time. She appears well-developed and well-nourished.  HENT:  Right Ear: External ear normal.  Left Ear: External ear normal.  Nose: Nose normal.  Mouth/Throat: Oropharynx is clear and moist.  Eyes: Conjunctivae normal are normal. Pupils are equal, round, and reactive to light.  Neck: Normal range of motion. Neck supple.  Cardiovascular: Normal rate, regular rhythm and normal heart sounds.   Pulmonary/Chest: Effort normal and breath sounds normal.  Abdominal: Soft. Bowel sounds are normal.  Musculoskeletal: Normal range of motion.  Neurological: She is alert and oriented to person, place, and time. She has normal reflexes.  Skin: Skin is warm and dry.      EKG within normal limits    Assessment & Plan:  Assessment: Weight gain, anxiety/depression hyperparathyroidism,   Plan: Lab sent to include TSH, and BMP. Will notify patient of the results. I believe the patient has an underlying anxiety/depression that needs to be treated. Consider psychotherapy. Consider medication therapy. However, patient is not open to these ideas at this point. She believes that there is something physically wrong with her and would like to rule that out.

## 2012-11-04 NOTE — Telephone Encounter (Signed)
Appointment with padonda today and will cx 1-13 appointment--

## 2012-11-07 ENCOUNTER — Other Ambulatory Visit: Payer: Self-pay | Admitting: Family

## 2012-11-07 ENCOUNTER — Ambulatory Visit: Payer: Self-pay | Admitting: Internal Medicine

## 2012-11-07 MED ORDER — BUPROPION HCL ER (XL) 150 MG PO TB24: 150.0000 mg | ORAL_TABLET | Freq: Every day | ORAL | Status: DC

## 2012-11-09 ENCOUNTER — Ambulatory Visit: Payer: Medicare Other | Admitting: Family

## 2012-11-09 ENCOUNTER — Other Ambulatory Visit: Payer: Self-pay | Admitting: Internal Medicine

## 2012-12-05 ENCOUNTER — Ambulatory Visit (INDEPENDENT_AMBULATORY_CARE_PROVIDER_SITE_OTHER): Payer: Medicare Other | Admitting: Internal Medicine

## 2012-12-05 ENCOUNTER — Encounter: Payer: Self-pay | Admitting: Internal Medicine

## 2012-12-05 VITALS — BP 145/88 | HR 84 | Temp 98.2°F | Resp 16 | Ht 62.0 in | Wt 172.0 lb

## 2012-12-05 DIAGNOSIS — T887XXA Unspecified adverse effect of drug or medicament, initial encounter: Secondary | ICD-10-CM

## 2012-12-05 DIAGNOSIS — I10 Essential (primary) hypertension: Secondary | ICD-10-CM

## 2012-12-05 DIAGNOSIS — R4586 Emotional lability: Secondary | ICD-10-CM

## 2012-12-05 DIAGNOSIS — F39 Unspecified mood [affective] disorder: Secondary | ICD-10-CM

## 2012-12-05 NOTE — Patient Instructions (Addendum)
Call in two week to give [place] an update on mood  Or use my chart

## 2012-12-05 NOTE — Progress Notes (Signed)
Subjective:    Patient ID: Felicia Perry, female    DOB: [birth year], 67 y.o.   MRN: [id number]  HPI The  Patient was confused and aggressive on the celexa The patient was agitated and noted increased swelling in ankles and hands. Had vivid dreams Still on the Wellbutrin Very confused  During this period She has not been able to exercise due to radicular back pain radiating on the left side. Blood pressure is up due to "emotions" has to "put my dog down"     Review of Systems  Constitutional: Negative for activity change, appetite change and fatigue.  HENT: Negative for ear pain, congestion, neck pain, postnasal drip and sinus pressure.   Eyes: Negative for redness and visual disturbance.  Respiratory: Negative for cough, shortness of breath and wheezing.   Gastrointestinal: Negative for abdominal pain and abdominal distention.  Genitourinary: Negative for dysuria, frequency and menstrual problem.  Musculoskeletal: Negative for myalgias, joint swelling and arthralgias.  Skin: Negative for rash and wound.  Neurological: Negative for dizziness, weakness and headaches.  Hematological: Negative for adenopathy. Does not bruise/bleed easily.  Psychiatric/Behavioral: Positive for behavioral problems and agitation. Negative for sleep disturbance and decreased concentration.       Past Medical History  Diagnosis Date  . Anxiety   . Asthma   . Hypertension   . Arthritis   . Diverticulosis   . Low back pain   . History of shingles   . PUD (peptic ulcer disease)   . Internal hemorrhoids   . Adenomatous polyp 11/23/2005  . GERD (gastroesophageal reflux disease)     History   Social History  . Marital Status: Divorced    Spouse Name: N/A    Number of Children: N/A  . Years of Education: N/A   Occupational History  . Cleaning    Social History Main Topics  . Smoking status: Former Games developer  . Smokeless tobacco: Never Used  . Alcohol Use: No  . Drug Use: No  . Sexually  Active: Yes   Other Topics Concern  . Not on file   Social History Narrative   3 glasses of tea daily     Past Surgical History  Procedure Laterality Date  . 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 Heart disease Mother   . Hypertension Mother   . Diabetes Mother   . COPD Father   . Colon cancer Neg Hx     Allergies  Allergen Reactions  . Cephalosporins     REACTION: tongue swelling  . Clarithromycin     REACTION: blisters in mouth  . Clarithromycin   . Doxycycline Hyclate     REACTION: nausea, vomiting  . Levofloxacin     REACTION: tongue swells  . Penicillins     REACTION: per patient causes rash,hives  . Zolpidem Tartrate     REACTION: difficulty with concentration    Current Outpatient Prescriptions on File Prior to Visit  Medication Sig Dispense Refill  . alprazolam (XANAX) 2 MG tablet TAKE 1 TABLET BY MOUTH AT BEDTIME AS NEEDED FOR SLEEP  30 tablet  4  . buPROPion (WELLBUTRIN XL) 150 MG 24 hr tablet Take 1 tablet (150 mg total) by mouth daily.  30 tablet  3  . ibuprofen (MOTRIN IB) 200 MG tablet Take 200 mg by mouth. As needed        . levocetirizine (XYZAL)  5 MG tablet 1` every am  30 tablet  6  . nadolol (CORGARD) 40 MG tablet Take 1 tablet (40 mg total) by mouth daily.  30 tablet  11  . NEXIUM 40 MG capsule take 1 capsule by mouth once daily  30 capsule  1  . Olmesartan-Amlodipine-HCTZ (TRIBENZOR) 40-5-12.5 MG TABS Take 1 capsule by mouth daily.  30 tablet  11   No current facility-administered medications on file prior to visit.    BP 174/90  Pulse 84  Temp(Src) 98.2 F (36.8 C)  Resp 16  Ht 5\' 2"  (1.575 m)  Wt 172 lb (78.019 kg)  BMI 31.45 kg/m2    Objective:   Physical Exam  Constitutional: She is oriented to person, place, and time. She appears well-developed and well-nourished. No distress.  HENT:  Head: Normocephalic and atraumatic.  Right Ear: External ear  normal.  Left Ear: External ear normal.  Nose: Nose normal.  Mouth/Throat: Oropharynx is clear and moist.  Eyes: Conjunctivae and EOM are normal. Pupils are equal, round, and reactive to light.  Neck: Normal range of motion. Neck supple. No JVD present. No tracheal deviation present. No thyromegaly present.  Cardiovascular: Normal rate and regular rhythm.   No murmur heard. Pulmonary/Chest: Effort normal and breath sounds normal. She has no wheezes. She exhibits no tenderness.  Abdominal: Soft. Bowel sounds are normal.  Musculoskeletal: Normal range of motion. She exhibits no edema and no tenderness.  Lymphadenopathy:    She has no cervical adenopathy.  Neurological: She is alert and oriented to person, place, and time. She has normal reflexes. No cranial nerve deficit.  Skin: Skin is warm and dry. She is not diaphoretic.  Psychiatric: She has a normal mood and affect. Her behavior is normal.          Assessment & Plan:  Mood disorder with medications reaction Has GERD, allergies  Asthma  All stable Stable blood pressure Continue the tribenzor

## 2012-12-16 ENCOUNTER — Other Ambulatory Visit: Payer: Self-pay | Admitting: *Deleted

## 2012-12-16 MED ORDER — ALPRAZOLAM 2 MG PO TABS: 2.0000 mg | ORAL_TABLET | Freq: Every evening | ORAL | Status: DC | PRN

## 2013-01-02 ENCOUNTER — Ambulatory Visit (INDEPENDENT_AMBULATORY_CARE_PROVIDER_SITE_OTHER): Payer: Medicare Other | Admitting: Family

## 2013-01-02 ENCOUNTER — Telehealth: Payer: Self-pay | Admitting: Internal Medicine

## 2013-01-02 ENCOUNTER — Encounter: Payer: Self-pay | Admitting: Family

## 2013-01-02 VITALS — BP 170/96 | HR 87 | Temp 98.6°F | Wt 168.0 lb

## 2013-01-02 DIAGNOSIS — R062 Wheezing: Secondary | ICD-10-CM

## 2013-01-02 DIAGNOSIS — J45901 Unspecified asthma with (acute) exacerbation: Secondary | ICD-10-CM

## 2013-01-02 MED ORDER — GUAIFENESIN-CODEINE 100-10 MG/5ML PO SYRP: 5.0000 mL | ORAL_SOLUTION | Freq: Three times a day (TID) | ORAL | Status: DC | PRN

## 2013-01-02 MED ORDER — PREDNISONE 20 MG PO TABS: ORAL_TABLET | ORAL | Status: DC

## 2013-01-02 MED ORDER — METHYLPREDNISOLONE ACETATE 40 MG/ML IJ SUSP
80.0000 mg | Freq: Once | INTRAMUSCULAR | Status: AC
  Administered 2013-01-02: 80 mg via INTRAMUSCULAR

## 2013-01-02 NOTE — Progress Notes (Signed)
Subjective:    Patient ID: Felicia Perry, female    DOB: 1946/07/11, 67 y.o.   MRN: [id number]  HPI 67 year old WF, nonsmoker, is in today with c/o cough, wheezing and SOB x 4 days and worsening. She is out of her Symbicort. Cough is productive with clear phelgm production.    Review of Systems  HENT: Negative.   Respiratory: Positive for shortness of breath and wheezing.   Cardiovascular: Negative.   Gastrointestinal: Negative.   Genitourinary: Negative.   Musculoskeletal: Negative.   Skin: Negative.   Allergic/Immunologic: Negative.   Neurological: Negative.   Hematological: Negative.   Psychiatric/Behavioral: Negative.    Past Medical History  Diagnosis Date  . Anxiety   . Asthma   . Hypertension   . Arthritis   . Diverticulosis   . Low back pain   . History of shingles   . PUD (peptic ulcer disease)   . Internal hemorrhoids   . Adenomatous polyp 11/23/2005  . GERD (gastroesophageal reflux disease)     History   Social History  . Marital Status: Divorced    Spouse Name: N/A    Number of Children: N/A  . Years of Education: N/A   Occupational History  . Cleaning    Social History Main Topics  . Smoking status: Former Games developer  . Smokeless tobacco: Never Used  . Alcohol Use: No  . Drug Use: No  . Sexually Active: Yes   Other Topics Concern  . Not on file   Social History Narrative   3 glasses of tea daily     Past Surgical History  Procedure Laterality Date  . 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 Heart disease Mother   . Hypertension Mother   . Diabetes Mother   . COPD Father   . Colon cancer Neg Hx     Allergies  Allergen Reactions  . Celexa (Citalopram Hydrobromide)     psychosis  . Cephalosporins     REACTION: tongue swelling  . Clarithromycin     REACTION: blisters in mouth  . Clarithromycin   . Doxycycline Hyclate     REACTION: nausea, vomiting  .  Levofloxacin     REACTION: tongue swells  . Penicillins     REACTION: per patient causes rash,hives  . Zolpidem Tartrate     REACTION: difficulty with concentration    Current Outpatient Prescriptions on File Prior to Visit  Medication Sig Dispense Refill  . alprazolam (XANAX) 2 MG tablet Take 1 tablet (2 mg total) by mouth at bedtime as needed for sleep.  30 tablet  4  . buPROPion (WELLBUTRIN XL) 150 MG 24 hr tablet Take 1 tablet (150 mg total) by mouth daily.  30 tablet  3  . calcitRIOL (ROCALTROL) 0.25 MCG capsule Take 0.25 mcg by mouth 3 (three) times a week.      Marland Kitchen ibuprofen (MOTRIN IB) 200 MG tablet Take 200 mg by mouth. As needed        . levocetirizine (XYZAL) 5 MG tablet 1` every am  30 tablet  6  . nadolol (CORGARD) 40 MG tablet Take 1 tablet (40 mg total) by mouth daily.  30 tablet  11  . NEXIUM 40 MG capsule take 1 capsule by mouth once daily  30 capsule  1  . Olmesartan-Amlodipine-HCTZ (TRIBENZOR) 40-5-12.5 MG TABS Take 1 capsule by mouth daily.  30 tablet  11   No current facility-administered medications on file prior to visit.    BP 170/96  Pulse 87  Temp(Src) 98.6 F (37 C) (Oral)  Wt 168 lb (76.204 kg)  BMI 30.72 kg/m2  SpO2 96%chart    Objective:   Physical Exam  Constitutional: She is oriented to person, place, and time. She appears well-developed and well-nourished.  HENT:  Right Ear: External ear normal.  Left Ear: External ear normal.  Nose: Nose normal.  Mouth/Throat: Oropharynx is clear and moist.  Neck: Normal range of motion. Neck supple. No thyromegaly present.  Cardiovascular: Normal rate, regular rhythm and normal heart sounds.   Pulmonary/Chest: Effort normal.  Abdominal: Soft. Bowel sounds are normal.  Musculoskeletal: Normal range of motion.  Neurological: She is alert and oriented to person, place, and time.  Skin: Skin is warm and dry.  Psychiatric: She has a normal mood and affect.          Assessment & Plan:  Assessment:  1.  Asthma exacerbation 2. Cough  Plan: Prednisone 60x3, 40x3, 20x3. Robitussin AC three times a day. Call the office if symptoms worsen or persist. Recheck as scheduled and as needed.

## 2013-01-02 NOTE — Patient Instructions (Addendum)
Asthma Attack Prevention  HOW CAN ASTHMA BE PREVENTED?  Currently, there is no way to prevent asthma from starting. However, you can take steps to control the disease and prevent its symptoms after you have been diagnosed. Learn about your asthma and how to control it. Take an active role to control your asthma by working with your caregiver to create and follow an asthma action plan. An asthma action plan guides you in taking your medicines properly, avoiding factors that make your asthma worse, tracking your level of asthma control, responding to worsening asthma, and seeking emergency care when needed. To track your asthma, keep records of your symptoms, check your peak flow number using a peak flow meter (handheld device that shows how well air moves out of your lungs), and get regular asthma checkups.   Other ways to prevent asthma attacks include:   Use medicines as your caregiver directs.   Identify and avoid things that make your asthma worse (as much as you can).   Keep track of your asthma symptoms and level of control.   Get regular checkups for your asthma.   With your caregiver, write a detailed plan for taking medicines and managing an asthma attack. Then be sure to follow your action plan. Asthma is an ongoing condition that needs regular monitoring and treatment.   Identify and avoid asthma triggers. A number of outdoor allergens and irritants (pollen, mold, cold air, air pollution) can trigger asthma attacks. Find out what causes or makes your asthma worse, and take steps to avoid those triggers (see below).   Monitor your breathing. Learn to recognize warning signs of an attack, such as slight coughing, wheezing or shortness of breath. However, your lung function may already decrease before you notice any signs or symptoms, so regularly measure and record your peak airflow with a home peak flow meter.   Identify and treat attacks early. If you act quickly, you're less likely to have a  severe attack. You will also need less medicine to control your symptoms. When your peak flow measurements decrease and alert you to an upcoming attack, take your medicine as instructed, and immediately stop any activity that may have triggered the attack. If your symptoms do not improve, get medical help.   Pay attention to increasing quick-relief inhaler use. If you find yourself relying on your quick-relief inhaler (such as albuterol), your asthma is not under control. See your caregiver about adjusting your treatment.  IDENTIFY AND CONTROL FACTORS THAT MAKE YOUR ASTHMA WORSE  A number of common things can set off or make your asthma symptoms worse (asthma triggers). Keep track of your asthma symptoms for several weeks, detailing all the environmental and emotional factors that are linked with your asthma. When you have an asthma attack, go back to your asthma diary to see which factor, or combination of factors, might have contributed to it. Once you know what these factors are, you can take steps to control many of them.   Allergies: If you have allergies and asthma, it is important to take asthma prevention steps at home. Asthma attacks (worsening of asthma symptoms) can be triggered by allergies, which can cause temporary increased inflammation of your airways. Minimizing contact with the substance to which you are allergic will help prevent an asthma attack.  Animal Dander:    Some people are allergic to the flakes of skin or dried saliva from animals with fur or feathers. Keep these pets out of your home.   If   you can't keep a pet outdoors, keep the pet out of your bedroom and other sleeping areas at all times, and keep the door closed.   Remove carpets and furniture covered with cloth from your home. If that is not possible, keep the pet away from fabric-covered furniture and carpets.  Dust Mites:   Many people with asthma are allergic to dust mites. Dust mites are tiny bugs that are found in every  home, in mattresses, pillows, carpets, fabric-covered furniture, bedcovers, clothes, stuffed toys, fabric, and other fabric-covered items.   Cover your mattress in a special dust-proof cover.   Cover your pillow in a special dust-proof cover, or wash the pillow each week in hot water. Water must be hotter than 130 F to kill dust mites. Cold or warm water used with detergent and bleach can also be effective.   Wash the sheets and blankets on your bed each week in hot water.   Try not to sleep or lie on cloth-covered cushions.   Call ahead when traveling and ask for a smoke-free hotel room. Bring your own bedding and pillows, in case the hotel only supplies feather pillows and down comforters, which may contain dust mites and cause asthma symptoms.   Remove carpets from your bedroom and those laid on concrete, if you can.   Keep stuffed toys out of the bed, or wash the toys weekly in hot water or cooler water with detergent and bleach.  Cockroaches:   Many people with asthma are allergic to the droppings and remains of cockroaches.   Keep food and garbage in closed containers. Never leave food out.   Use poison baits, traps, powders, gels, or paste (for example, boric acid).   If a spray is used to kill cockroaches, stay out of the room until the odor goes away.  Indoor Mold:   Fix leaky faucets, pipes, or other sources of water that have mold around them.   Clean moldy surfaces with a cleaner that has bleach in it.  Pollen and Outdoor Mold:   When pollen or mold spore counts are high, try to keep your windows closed.   Stay indoors with windows closed from late morning to afternoon, if you can. Pollen and some mold spore counts are highest at that time.   Ask your caregiver whether you need to take or increase anti-inflammatory medicine before your allergy season starts.  Irritants:    Tobacco smoke is an irritant. If you smoke, ask your caregiver how you can quit. Ask family members to quit  smoking, too. Do not allow smoking in your home or car.   If possible, do not use a wood-burning stove, kerosene heater, or fireplace. Minimize exposure to all sources of smoke, including incense, candles, fires, and fireworks.   Try to stay away from strong odors and sprays, such as perfume, talcum powder, hair spray, and paints.   Decrease humidity in your home and use an indoor air cleaning device. Reduce indoor humidity to below 60 percent. Dehumidifiers or central air conditioners can do this.   Try to have someone else vacuum for you once or twice a week, if you can. Stay out of rooms while they are being vacuumed and for a short while afterward.   If you vacuum, use a dust mask from a hardware store, a double-layered or microfilter vacuum cleaner bag, or a vacuum cleaner with a HEPA filter.   Sulfites in foods and beverages can be irritants. Do not drink beer or   wine, or eat dried fruit, processed potatoes, or shrimp if they cause asthma symptoms.   Cold air can trigger an asthma attack. Cover your nose and mouth with a scarf on cold or windy days.   Several health conditions can make asthma more difficult to manage, including runny nose, sinus infections, reflux disease, psychological stress, and sleep apnea. Your caregiver will treat these conditions, as well.   Avoid close contact with people who have a cold or the flu, since your asthma symptoms may get worse if you catch the infection from them. Wash your hands thoroughly after touching items that may have been handled by people with a respiratory infection.   Get a flu shot every year to protect against the flu virus, which often makes asthma worse for days or weeks. Also get a pneumonia shot once every five to 10 years.  Drugs:   Aspirin and other painkillers can cause asthma attacks. 10% to 20% of people with asthma have sensitivity to aspirin or a group of painkillers called non-steroidal anti-inflammatory drugs (NSAIDS), such as ibuprofen  and naproxen. These drugs are used to treat pain and reduce fevers. Asthma attacks caused by any of these medicines can be severe and even fatal. These drugs must be avoided in people who have known aspirin sensitive asthma. Products with acetaminophen are considered safe for people who have asthma. It is important that people with aspirin sensitivity read labels of all over-the-counter drugs used to treat pain, colds, coughs, and fever.   Beta blockers and ACE inhibitors are other drugs which you should discuss with your caregiver, in relation to your asthma.  ALLERGY SKIN TESTING   Ask your asthma caregiver about allergy skin testing or blood testing (RAST test) to identify the allergens to which you are sensitive. If you are found to have allergies, allergy shots (immunotherapy) for asthma may help prevent future allergies and asthma. With allergy shots, small doses of allergens (substances to which you are allergic) are injected under your skin on a regular schedule. Over a period of time, your body may become used to the allergen and less responsive with asthma symptoms. You can also take measures to minimize your exposure to those allergens.  EXERCISE   If you have exercise-induced asthma, or are planning vigorous exercise, or exercise in cold, humid, or dry environments, prevent exercise-induced asthma by following your caregiver's advice regarding asthma treatment before exercising.  Document Released: 09/30/2009 Document Revised: 01/04/2012 Document Reviewed: 09/30/2009  [employer] Patient Information 2013 [employer]

## 2013-01-02 NOTE — Telephone Encounter (Signed)
Patient Information:  Caller Name: Carly  Phone: [phone]  Patient: Felicia Perry, Felicia Perry  Gender: Female  DOB: 1946/03/15  Age: 67 Years  PCP: Darryll Capers (Adults only)  Office Follow Up:  Does the office need to follow up with this patient?: No  Instructions For The Office: N/A   Symptoms  Reason For Call & Symptoms: Pt is calling about having a cough since Thursday 12/29/12. Pt was wheezing over the w/e but it has decreased but now  she is short of breath when she moves around.. She has made an appt next week to see Dr. Lovell Sheehan  but wants to know if he would call in something. Pt has asthma. Pt does not have a rescue inhaler anymore. Rn triaged and advised appt today.   Reviewed Health History In EMR: Yes  Reviewed Medications In EMR: Yes  Reviewed Allergies In EMR: Yes  Reviewed Surgeries / Procedures: Yes  Date of Onset of Symptoms: 12/29/2012  Treatments Tried: Breo inhaler QD; Mucinex congestion and cough  Treatments Tried Worked: No  Guideline(s) Used:  Asthma Attack  Disposition Per Guideline:   See Today in Office  Reason For Disposition Reached:   Asthma medicine (nebulizer or inhaler) is needed more frequently than every 4 hours to keep you comfortable  Advice Given:  N/A  Appointment Scheduled:  01/02/2013 11:15:00 Appointment Scheduled Provider:  Adline Mango (Family Practice)

## 2013-01-11 ENCOUNTER — Encounter: Payer: Self-pay | Admitting: Internal Medicine

## 2013-01-11 ENCOUNTER — Ambulatory Visit (INDEPENDENT_AMBULATORY_CARE_PROVIDER_SITE_OTHER): Payer: Medicare Other | Admitting: Internal Medicine

## 2013-01-11 VITALS — BP 180/100 | HR 60 | Temp 98.2°F | Wt 173.0 lb

## 2013-01-11 DIAGNOSIS — J208 Acute bronchitis due to other specified organisms: Secondary | ICD-10-CM

## 2013-01-11 DIAGNOSIS — B9689 Other specified bacterial agents as the cause of diseases classified elsewhere: Secondary | ICD-10-CM

## 2013-01-11 DIAGNOSIS — J45901 Unspecified asthma with (acute) exacerbation: Secondary | ICD-10-CM

## 2013-01-11 DIAGNOSIS — J209 Acute bronchitis, unspecified: Secondary | ICD-10-CM

## 2013-01-11 DIAGNOSIS — A499 Bacterial infection, unspecified: Secondary | ICD-10-CM

## 2013-01-11 MED ORDER — AZITHROMYCIN 250 MG PO TABS: ORAL_TABLET | ORAL | Status: DC

## 2013-01-11 MED ORDER — METHYLPREDNISOLONE ACETATE 80 MG/ML IJ SUSP
120.0000 mg | Freq: Once | INTRAMUSCULAR | Status: AC
  Administered 2013-01-11: 80 mg via INTRAMUSCULAR

## 2013-01-11 MED ORDER — IPRATROPIUM-ALBUTEROL 0.5-2.5 (3) MG/3ML IN SOLN
3.0000 mL | RESPIRATORY_TRACT | Status: AC
  Administered 2013-01-11: 3 mL via RESPIRATORY_TRACT

## 2013-01-11 NOTE — Progress Notes (Signed)
Subjective:    Patient ID: Felicia Perry, female    DOB: 1946-07-28, 67 y.o.   MRN: [id number]  HPI Patient has multifactorial asthma.  I believe that there is a significant flare do to stopping her PPI due to osteoporosis risks increased GERD with probable low-grade aspiration and asthmatic bronchitis.  She was given a Depo-Medrol shot and a nebulizer treatment today with excellent results.  She had completed a ten-day course of steroids but had no antibiotics during that period of time   Review of Systems  Constitutional: Positive for activity change and appetite change.  HENT: Positive for congestion.   Eyes: Negative.   Respiratory: Positive for cough, chest tightness, shortness of breath and stridor.   Endocrine: Negative.   Genitourinary: Positive for frequency.  Neurological: Negative.   Hematological: Negative.    Past Medical History  Diagnosis Date  . Anxiety   . Asthma   . Hypertension   . Arthritis   . Diverticulosis   . Low back pain   . History of shingles   . PUD (peptic ulcer disease)   . Internal hemorrhoids   . Adenomatous polyp 11/23/2005  . GERD (gastroesophageal reflux disease)     History   Social History  . Marital Status: Divorced    Spouse Name: N/A    Number of Children: N/A  . Years of Education: N/A   Occupational History  . Cleaning    Social History Main Topics  . Smoking status: Former Games developer  . Smokeless tobacco: Never Used  . Alcohol Use: No  . Drug Use: No  . Sexually Active: Yes   Other Topics Concern  . Not on file   Social History Narrative   3 glasses of tea daily     Past Surgical History  Procedure Laterality Date  . 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 Heart disease Mother   . Hypertension Mother   . Diabetes Mother   . COPD Father   . Colon cancer Neg Hx     Allergies  Allergen Reactions  . Celexa (Citalopram  Hydrobromide)     psychosis  . Cephalosporins     REACTION: tongue swelling  . Clarithromycin     REACTION: blisters in mouth  . Clarithromycin   . Doxycycline Hyclate     REACTION: nausea, vomiting  . Levofloxacin     REACTION: tongue swells  . Penicillins     REACTION: per patient causes rash,hives  . Zolpidem Tartrate     REACTION: difficulty with concentration    Current Outpatient Prescriptions on File Prior to Visit  Medication Sig Dispense Refill  . alprazolam (XANAX) 2 MG tablet Take 1 tablet (2 mg total) by mouth at bedtime as needed for sleep.  30 tablet  4  . buPROPion (WELLBUTRIN XL) 150 MG 24 hr tablet Take 1 tablet (150 mg total) by mouth daily.  30 tablet  3  . calcitRIOL (ROCALTROL) 0.25 MCG capsule Take 0.25 mcg by mouth 3 (three) times a week.      Marland Kitchen ibuprofen (MOTRIN IB) 200 MG tablet Take 200 mg by mouth. As needed        . levocetirizine (XYZAL) 5 MG tablet 1` every am  30 tablet  6  . nadolol (CORGARD) 40 MG tablet Take 1 tablet (40 mg total) by mouth daily.  30 tablet  11  .  NEXIUM 40 MG capsule take 1 capsule by mouth once daily  30 capsule  1  . Olmesartan-Amlodipine-HCTZ (TRIBENZOR) 40-5-12.5 MG TABS Take 1 capsule by mouth daily.  30 tablet  11  . predniSONE (DELTASONE) 20 MG tablet 60mg  PO qam x 3 days, 40mg  po qam x 3 days, 20mg  qam x 3 days  18 tablet  0  . guaiFENesin-codeine (ROBITUSSIN AC) 100-10 MG/5ML syrup Take 5 mLs by mouth 3 (three) times daily as needed for cough.  120 mL  0   No current facility-administered medications on file prior to visit.    BP 180/100  Pulse 60  Temp(Src) 98.2 F (36.8 C) (Oral)  Wt 173 lb (78.472 kg)  BMI 31.63 kg/m2  SpO2 96%       Objective:   Physical Exam  Constitutional: She is oriented to person, place, and time. She appears well-developed and well-nourished. No distress.  HENT:  Head: Normocephalic and atraumatic.  Right Ear: External ear normal.  Left Ear: External ear normal.  Nose: Nose  normal.  Mouth/Throat: Oropharynx is clear and moist.  Eyes: Conjunctivae and EOM are normal. Pupils are equal, round, and reactive to light.  Neck: Normal range of motion. Neck supple. No JVD present. No tracheal deviation present. No thyromegaly present.  Cardiovascular: Normal rate, regular rhythm and intact distal pulses.   Murmur heard. Pulmonary/Chest: Effort normal and breath sounds normal. She has no wheezes. She exhibits no tenderness.  She'll exam was positive for wheezing and tightness but after nebulizer treatment she had a normal pulmonary exam  Abdominal: Soft. Bowel sounds are normal.  Musculoskeletal: Normal range of motion. She exhibits no edema and no tenderness.  Lymphadenopathy:    She has no cervical adenopathy.  Neurological: She is alert and oriented to person, place, and time. She has normal reflexes. No cranial nerve deficit.  Skin: Skin is warm and dry. She is not diaphoretic.  Psychiatric: She has a normal mood and affect. Her behavior is normal.          Assessment & Plan:  Need to be on prevention and maintenance protocols The  antibiotic coverage for acute exacerbation will be azithromycin We will increase her Symbicort to 1602 puffs twice daily We will resume the Nexium for GERD and probable exacerbation of her asthmatic bronchitis or aspiration

## 2013-01-11 NOTE — Patient Instructions (Signed)
Use the symbicort twice a day two puffs

## 2013-01-12 MED ORDER — AZITHROMYCIN 250 MG PO TABS: ORAL_TABLET | ORAL | Status: DC

## 2013-01-12 NOTE — Addendum Note (Signed)
Addended by: Kern Reap B on: 01/12/2013 03:24 PM   Modules accepted: Orders

## 2013-01-13 ENCOUNTER — Telehealth: Payer: Self-pay | Admitting: *Deleted

## 2013-01-13 NOTE — Telephone Encounter (Signed)
Patient is calling back to inform Dr Lovell Sheehan that she is feeling better today.  She doesn't feel "as tight and wheezing".  She is about a 6-7 out of 10. She was able to work a half day today. FYI.

## 2013-01-23 ENCOUNTER — Other Ambulatory Visit: Payer: Self-pay | Admitting: Internal Medicine

## 2013-01-24 ENCOUNTER — Other Ambulatory Visit: Payer: Self-pay | Admitting: *Deleted

## 2013-01-24 MED ORDER — CALCITRIOL 0.25 MCG PO CAPS: 0.2500 ug | ORAL_CAPSULE | ORAL | Status: DC

## 2013-03-09 ENCOUNTER — Encounter: Payer: Self-pay | Admitting: Internal Medicine

## 2013-03-09 ENCOUNTER — Telehealth: Payer: Self-pay | Admitting: Internal Medicine

## 2013-03-09 ENCOUNTER — Ambulatory Visit (INDEPENDENT_AMBULATORY_CARE_PROVIDER_SITE_OTHER): Payer: Medicare Other | Admitting: Internal Medicine

## 2013-03-09 VITALS — BP 140/80 | HR 64 | Temp 98.6°F | Resp 18 | Wt 171.0 lb

## 2013-03-09 DIAGNOSIS — I10 Essential (primary) hypertension: Secondary | ICD-10-CM

## 2013-03-09 DIAGNOSIS — R21 Rash and other nonspecific skin eruption: Secondary | ICD-10-CM

## 2013-03-09 NOTE — Patient Instructions (Addendum)
Limit your sodium (Salt) intake  Follow up with Dr. Lovell Sheehan as scheduled  Please check your blood pressure on a regular basis.  If it is consistently greater than 150/90, please make an office appointment.  Call or return to clinic prn if these symptoms worsen or fail to improve as anticipated.

## 2013-03-09 NOTE — Telephone Encounter (Signed)
Patient Information:  Caller Name: Lache  Phone: [phone]  Patient: Felicia Perry, Felicia Perry  Gender: Female  DOB: 1946/02/01  Age: 67 Years  PCP: Darryll Capers (Adults only)  Office Follow Up:  Does the office need to follow up with this patient?: No  Instructions For The Office: N/A   Symptoms  Reason For Call & Symptoms: pt reports on Tuesday she thought she had a bite on the back of her neck.  Pt reports that today (03/09/13) she has whelps on the back of neck spreading.  Reviewed Health History In EMR: Yes  Reviewed Medications In EMR: Yes  Reviewed Allergies In EMR: Yes  Reviewed Surgeries / Procedures: Yes  Date of Onset of Symptoms: 03/07/2013  Treatments Tried: hydrocortisone cream, Benadryl  Treatments Tried Worked: No  Guideline(s) Used:  Manufacturing engineer  Disposition Per Guideline:   See Today in Office  Reason For Disposition Reached:   Patient wants to be seen  Advice Given:  N/A  Patient Will Follow Care Advice:  YES  Appointment Scheduled:  03/09/2013 14:30:00 Appointment Scheduled Provider:  Eleonore Chiquito (Family Practice)  (no appt found with Dr Lovell Sheehan or NP Orvan Falconer)

## 2013-03-09 NOTE — Progress Notes (Signed)
Subjective:    Patient ID: Felicia Perry, female    DOB: Sep 06, 1946, 67 y.o.   MRN: [id number]  HPI  67 year old patient who has treated hypertension. She complains of a rash involving her left occipital scalp area she also describes pain involving the left ear left facial area and some involvement of the left throat. She states she has had shingles involving her right arm in the past.  Past Medical History  Diagnosis Date  . Anxiety   . Asthma   . Hypertension   . Arthritis   . Diverticulosis   . Low back pain   . History of shingles   . PUD (peptic ulcer disease)   . Internal hemorrhoids   . Adenomatous polyp 11/23/2005  . GERD (gastroesophageal reflux disease)     History   Social History  . Marital Status: Divorced    Spouse Name: N/A    Number of Children: N/A  . Years of Education: N/A   Occupational History  . Cleaning    Social History Main Topics  . Smoking status: Former Games developer  . Smokeless tobacco: Never Used  . Alcohol Use: No  . Drug Use: No  . Sexually Active: Yes   Other Topics Concern  . Not on file   Social History Narrative   3 glasses of tea daily     Past Surgical History  Procedure Laterality Date  . 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 Heart disease Mother   . Hypertension Mother   . Diabetes Mother   . COPD Father   . Colon cancer Neg Hx     Allergies  Allergen Reactions  . Celexa (Citalopram Hydrobromide)     psychosis  . Cephalosporins     REACTION: tongue swelling  . Clarithromycin     REACTION: blisters in mouth  . Clarithromycin   . Doxycycline Hyclate     REACTION: nausea, vomiting  . Levofloxacin     REACTION: tongue swells  . Penicillins     REACTION: per patient causes rash,hives  . Zolpidem Tartrate     REACTION: difficulty with concentration    Current Outpatient Prescriptions on File Prior to Visit  Medication Sig Dispense  Refill  . alprazolam (XANAX) 2 MG tablet Take 1 tablet (2 mg total) by mouth at bedtime as needed for sleep.  30 tablet  4  . budesonide-formoterol (SYMBICORT) 160-4.5 MCG/ACT inhaler Inhale 2 puffs into the lungs 2 (two) times daily.      Marland Kitchen buPROPion (WELLBUTRIN XL) 150 MG 24 hr tablet Take 1 tablet (150 mg total) by mouth daily.  30 tablet  3  . calcitRIOL (ROCALTROL) 0.25 MCG capsule Take 1 capsule (0.25 mcg total) by mouth 3 (three) times a week.  15 capsule  2  . guaiFENesin-codeine (ROBITUSSIN AC) 100-10 MG/5ML syrup Take 5 mLs by mouth 3 (three) times daily as needed for cough.  120 mL  0  . ibuprofen (MOTRIN IB) 200 MG tablet Take 200 mg by mouth. As needed        . levocetirizine (XYZAL) 5 MG tablet 1` every am  30 tablet  6  . nadolol (CORGARD) 40 MG tablet Take 1 tablet (40 mg total) by mouth daily.  30 tablet  11  . NEXIUM 40 MG capsule TAKE ONE CAPSULE EACH DAY  30 capsule  11  . Olmesartan-Amlodipine-HCTZ (TRIBENZOR) 40-5-12.5  MG TABS Take 1 capsule by mouth daily.  30 tablet  11   No current facility-administered medications on file prior to visit.    BP 140/80  Pulse 64  Temp(Src) 98.6 F (37 C) (Oral)  Resp 18  Wt 171 lb (77.565 kg)  BMI 31.27 kg/m2  SpO2 97%       Review of Systems  Skin: Positive for rash.       Objective:   Physical Exam  Constitutional: She is oriented to person, place, and time. She appears well-developed and well-nourished. No distress.  HENT:  Head: Normocephalic.  Right Ear: External ear normal.  Left Ear: External ear normal.  Mouth/Throat: Oropharynx is clear and moist.  Eyes: Conjunctivae and EOM are normal. Pupils are equal, round, and reactive to light.  Neck: Normal range of motion. Neck supple. No thyromegaly present.  Cardiovascular: Normal rate, regular rhythm, normal heart sounds and intact distal pulses.   Pulmonary/Chest: Effort normal and breath sounds normal.  Abdominal: Soft. Bowel sounds are normal. She exhibits  no mass. There is no tenderness.  Musculoskeletal: Normal range of motion.  Lymphadenopathy:    She has no cervical adenopathy.  Neurological: She is alert and oriented to person, place, and time.  Skin: Skin is warm and dry. No rash noted.  Scattered nonspecific erythematous papular lesions involving the left occipital scalp  Psychiatric: She has a normal mood and affect. Her behavior is normal.          Assessment & Plan:   Nonspecific scalp dermatitis. Possibility of shingles discussed the rash is very mild as are her symptoms. Will observe over the next 24 hours and if rash or symptoms intensify we'll call the office tomorrow and be placed on antiviral therapy Hypertension stable

## 2013-03-14 ENCOUNTER — Telehealth: Payer: Self-pay | Admitting: Internal Medicine

## 2013-03-14 MED ORDER — VALACYCLOVIR HCL 1 G PO TABS: 1000.0000 mg | ORAL_TABLET | Freq: Two times a day (BID) | ORAL | Status: DC

## 2013-03-14 NOTE — Telephone Encounter (Signed)
Patient Information:  Caller Name: Lashya  Phone: [phone]  Patient: Felicia Perry, Felicia Perry  Gender: Female  DOB: 01-11-1946  Age: 67 Years  PCP: Darryll Capers (Adults only)  Office Follow Up:  Does the office need to follow up with this patient?: Yes  Instructions For The Office: Please call pt back. She uses [facility]   Symptoms  Reason For Call & Symptoms: Pt is calling - she was in last week for blisters on the back of her neck and head last week. Pt was worked in with Dr.K. He advised pt it might be early shingles. He did not prescribe any meds. He asked pt to call back if any progression. MD did not want her to start antiviral med without being sure. Pt is calling back to say the blisters have spread to her scalp/ear and crown - all on the left side. The rash is painful now/it wasn't before. Pt is calling back per MD request.  Reviewed Health History In EMR: Yes  Reviewed Medications In EMR: Yes  Reviewed Allergies In EMR: Yes  Reviewed Surgeries / Procedures: Yes  Date of Onset of Symptoms: 03/07/2013  Guideline(s) Used:  Skin Lesion - Moles or Growths  Disposition Per Guideline:   See Within 2 Weeks in Office  Reason For Disposition Reached:   Sticks up out of the skin (elevated), and feels rough to the touch  Advice Given:  N/A  RN Overrode Recommendation:  Patient Requests Prescription  Pt was seen. Is calling back for antiviral per MD advice.

## 2013-03-14 NOTE — Telephone Encounter (Signed)
Start generic Valtrex 1 g #14 one twice a day

## 2013-03-14 NOTE — Telephone Encounter (Signed)
Sent in valtrex to [facility] and pt informed

## 2013-03-14 NOTE — Telephone Encounter (Signed)
SH0RT VERSION: You saw pt Thursday with blisters on back. She calls back today with more blisters all on 1 side- she was told to call back if this happens- painful-please advise

## 2013-03-30 ENCOUNTER — Other Ambulatory Visit: Payer: Self-pay

## 2013-03-30 MED ORDER — BUPROPION HCL ER (XL) 150 MG PO TB24: 150.0000 mg | ORAL_TABLET | Freq: Every day | ORAL | Status: DC

## 2013-04-24 ENCOUNTER — Ambulatory Visit (INDEPENDENT_AMBULATORY_CARE_PROVIDER_SITE_OTHER): Payer: Medicare Other | Admitting: Internal Medicine

## 2013-04-24 ENCOUNTER — Ambulatory Visit (INDEPENDENT_AMBULATORY_CARE_PROVIDER_SITE_OTHER)
Admission: RE | Admit: 2013-04-24 | Discharge: 2013-04-24 | Disposition: A | Discharge status: Home / Self Care | Payer: Medicare Other | Source: Ambulatory Visit | Attending: Internal Medicine | Admitting: Internal Medicine

## 2013-04-24 ENCOUNTER — Encounter: Payer: Self-pay | Admitting: Internal Medicine

## 2013-04-24 VITALS — BP 150/84 | HR 76 | Temp 98.3°F | Resp 16 | Ht 62.0 in | Wt 173.0 lb

## 2013-04-24 DIAGNOSIS — M5416 Radiculopathy, lumbar region: Secondary | ICD-10-CM

## 2013-04-24 DIAGNOSIS — IMO0002 Reserved for concepts with insufficient information to code with codable children: Secondary | ICD-10-CM

## 2013-04-24 DIAGNOSIS — J4530 Mild persistent asthma, uncomplicated: Secondary | ICD-10-CM

## 2013-04-24 DIAGNOSIS — I10 Essential (primary) hypertension: Secondary | ICD-10-CM

## 2013-04-24 DIAGNOSIS — J45909 Unspecified asthma, uncomplicated: Secondary | ICD-10-CM

## 2013-04-24 MED ORDER — METHYLPREDNISOLONE (PAK) 4 MG PO TABS: ORAL_TABLET | ORAL | Status: DC

## 2013-04-24 MED ORDER — METHOCARBAMOL 500 MG PO TABS: 500.0000 mg | ORAL_TABLET | Freq: Three times a day (TID) | ORAL | Status: DC

## 2013-04-24 NOTE — Progress Notes (Signed)
Subjective:    Patient ID: Felicia Perry, female    DOB: Mar 02, 1946, 67 y.o.   MRN: [id number]  HPI Increased back pain radiating to the left buttock and leg Last xray 2005 CT Needs back films HTN stable Asthma stable   Review of Systems  Constitutional: Negative for activity change, appetite change and fatigue.  HENT: Negative for ear pain, congestion, neck pain, postnasal drip and sinus pressure.   Eyes: Negative for redness and visual disturbance.  Respiratory: Negative for cough, shortness of breath and wheezing.   Gastrointestinal: Negative for abdominal pain and abdominal distention.  Genitourinary: Negative for dysuria, frequency and menstrual problem.  Musculoskeletal: Negative for myalgias, joint swelling and arthralgias.  Skin: Negative for rash and wound.  Neurological: Negative for dizziness, weakness and headaches.  Hematological: Negative for adenopathy. Does not bruise/bleed easily.  Psychiatric/Behavioral: Negative for sleep disturbance and decreased concentration.   Past Medical History  Diagnosis Date  . Anxiety   . Asthma   . Hypertension   . Arthritis   . Diverticulosis   . Low back pain   . History of shingles   . PUD (peptic ulcer disease)   . Internal hemorrhoids   . Adenomatous polyp 11/23/2005  . GERD (gastroesophageal reflux disease)     History   Social History  . Marital Status: Divorced    Spouse Name: N/A    Number of Children: N/A  . Years of Education: N/A   Occupational History  . Cleaning    Social History Main Topics  . Smoking status: Former Games developer  . Smokeless tobacco: Never Used  . Alcohol Use: No  . Drug Use: No  . Sexually Active: Yes   Other Topics Concern  . Not on file   Social History Narrative   3 glasses of tea daily     Past Surgical History  Procedure Laterality Date  . 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 Heart disease  Mother   . Hypertension Mother   . Diabetes Mother   . COPD Father   . Colon cancer Neg Hx     Allergies  Allergen Reactions  . Celexa (Citalopram Hydrobromide)     psychosis  . Cephalosporins     REACTION: tongue swelling  . Clarithromycin     REACTION: blisters in mouth  . Clarithromycin   . Doxycycline Hyclate     REACTION: nausea, vomiting  . Levofloxacin     REACTION: tongue swells  . Penicillins     REACTION: per patient causes rash,hives  . Zolpidem Tartrate     REACTION: difficulty with concentration    Current Outpatient Prescriptions on File Prior to Visit  Medication Sig Dispense Refill  . alprazolam (XANAX) 2 MG tablet Take 1 tablet (2 mg total) by mouth at bedtime as needed for sleep.  30 tablet  4  . budesonide-formoterol (SYMBICORT) 160-4.5 MCG/ACT inhaler Inhale 2 puffs into the lungs 2 (two) times daily.      Marland Kitchen buPROPion (WELLBUTRIN XL) 150 MG 24 hr tablet Take 1 tablet (150 mg total) by mouth daily.  30 tablet  3  . calcitRIOL (ROCALTROL) 0.25 MCG capsule Take 1 capsule (0.25 mcg total) by mouth 3 (three) times a week.  15 capsule  2  . ibuprofen (MOTRIN IB) 200 MG tablet Take 200 mg by mouth. As needed        .  levocetirizine (XYZAL) 5 MG tablet 1` every am  30 tablet  6  . nadolol (CORGARD) 40 MG tablet Take 1 tablet (40 mg total) by mouth daily.  30 tablet  11  . NEXIUM 40 MG capsule TAKE ONE CAPSULE EACH DAY  30 capsule  11  . Olmesartan-Amlodipine-HCTZ (TRIBENZOR) 40-5-12.5 MG TABS Take 1 capsule by mouth daily.  30 tablet  11   No current facility-administered medications on file prior to visit.    BP 150/84  Pulse 76  Temp(Src) 98.3 F (36.8 C)  Resp 16  Ht 5\' 2"  (1.575 m)  Wt 173 lb (78.472 kg)  BMI 31.63 kg/m2       Objective:   Physical Exam  Nursing note and vitals reviewed. Constitutional: Felicia Perry is oriented to person, place, and time. Felicia Perry appears well-developed and well-nourished. No distress.  HENT:  Head: Normocephalic and  atraumatic.  Right Ear: External ear normal.  Left Ear: External ear normal.  Nose: Nose normal.  Mouth/Throat: Oropharynx is clear and moist.  Eyes: Conjunctivae and EOM are normal. Pupils are equal, round, and reactive to light.  Neck: Normal range of motion. Neck supple. No JVD present. No tracheal deviation present. No thyromegaly present.  Cardiovascular: Normal rate, regular rhythm, normal heart sounds and intact distal pulses.   No murmur heard. Pulmonary/Chest: Effort normal and breath sounds normal. Felicia Perry has no wheezes. Felicia Perry exhibits no tenderness.  Abdominal: Soft. Bowel sounds are normal.  Musculoskeletal: Felicia Perry exhibits edema and tenderness.  Back pain  Lymphadenopathy:    Felicia Perry has no cervical adenopathy.  Neurological: Felicia Perry is alert and oriented to person, place, and time. Felicia Perry has normal reflexes. No cranial nerve deficit.  Skin: Skin is warm and dry. Felicia Perry is not diaphoretic.  Psychiatric: Felicia Perry has a normal mood and affect. Her behavior is normal.          Assessment & Plan:  Back pain that appears to be radicular in nature Effected by standing long periods of time  Shingles resolved Asthma stable  Back pain will give ls spine and robaxin and medrol

## 2013-04-24 NOTE — Patient Instructions (Addendum)
Back Exercises Back exercises help treat and prevent back injuries. The goal of back exercises is to increase the strength of your abdominal and back muscles and the flexibility of your back. These exercises should be started when you no longer have back pain. Back exercises include:  Pelvic Tilt. Lie on your back with your knees bent. Tilt your pelvis until the lower part of your back is against the floor. Hold this position 5 to 10 sec and repeat 5 to 10 times.  Knee to Chest. Pull first 1 knee up against your chest and hold for 20 to 30 seconds, repeat this with the other knee, and then both knees. This may be done with the other leg straight or bent, whichever feels better.  Sit-Ups or Curl-Ups. Bend your knees 90 degrees. Start with tilting your pelvis, and do a partial, slow sit-up, lifting your trunk only 30 to 45 degrees off the floor. Take at least 2 to 3 seconds for each sit-up. Do not do sit-ups with your knees out straight. If partial sit-ups are difficult, simply do the above but with only tightening your abdominal muscles and holding it as directed.  Hip-Lift. Lie on your back with your knees flexed 90 degrees. Push down with your feet and shoulders as you raise your hips a couple inches off the floor; hold for 10 seconds, repeat 5 to 10 times.  Back arches. Lie on your stomach, propping yourself up on bent elbows. Slowly press on your hands, causing an arch in your low back. Repeat 3 to 5 times. Any initial stiffness and discomfort should lessen with repetition over time.  Shoulder-Lifts. Lie face down with arms beside your body. Keep hips and torso pressed to floor as you slowly lift your head and shoulders off the floor. Do not overdo your exercises, especially in the beginning. Exercises may cause you some mild back discomfort which lasts for a few minutes; however, if the pain is more severe, or lasts for more than 15 minutes, do not continue exercises until you see your caregiver.  Improvement with exercise therapy for back problems is slow.  See your caregivers for assistance with developing a proper back exercise program. Document Released: 11/19/2004 Document Revised: 01/04/2012 Document Reviewed: 08/13/2011 [employer] Patient Information 2014 [employer]  

## 2013-04-27 ENCOUNTER — Encounter: Payer: Self-pay | Admitting: Internal Medicine

## 2013-04-27 DIAGNOSIS — G8929 Other chronic pain: Secondary | ICD-10-CM

## 2013-05-09 ENCOUNTER — Encounter: Payer: Self-pay | Admitting: Internal Medicine

## 2013-05-10 NOTE — Progress Notes (Signed)
Pt has already been set up for [place] pt

## 2013-05-12 ENCOUNTER — Telehealth: Payer: Self-pay | Admitting: *Deleted

## 2013-05-12 MED ORDER — CALCITRIOL 0.25 MCG PO CAPS: 0.2500 ug | ORAL_CAPSULE | ORAL | Status: DC

## 2013-05-12 NOTE — Telephone Encounter (Signed)
Rx sent in to pharmacy. 

## 2013-05-15 ENCOUNTER — Other Ambulatory Visit: Payer: Self-pay | Admitting: Internal Medicine

## 2013-05-16 ENCOUNTER — Other Ambulatory Visit: Payer: Self-pay | Admitting: Internal Medicine

## 2013-05-31 ENCOUNTER — Other Ambulatory Visit: Payer: Self-pay

## 2013-07-17 ENCOUNTER — Other Ambulatory Visit: Payer: Self-pay | Admitting: Internal Medicine

## 2013-07-21 ENCOUNTER — Ambulatory Visit (INDEPENDENT_AMBULATORY_CARE_PROVIDER_SITE_OTHER): Payer: Medicare Other | Admitting: Endocrinology

## 2013-07-21 ENCOUNTER — Encounter: Payer: Self-pay | Admitting: Endocrinology

## 2013-07-21 VITALS — BP 132/72 | HR 71 | Ht 62.0 in | Wt 181.0 lb

## 2013-07-21 DIAGNOSIS — Z23 Encounter for immunization: Secondary | ICD-10-CM

## 2013-07-21 DIAGNOSIS — N2581 Secondary hyperparathyroidism of renal origin: Secondary | ICD-10-CM

## 2013-07-21 NOTE — Progress Notes (Signed)
Subjective:    Patient ID: Felicia Perry, female    DOB: [birth year], 67 y.o.   MRN: [id number]  HPI Pt returns for f/u of mild secondary hyperparathyroidism.  This was found in 2012, when she fx a finger, and x-ray suggested this dx.  pt states she feels well in general, except for ongoing weight gain.   Past Medical History  Diagnosis Date  . Anxiety   . Asthma   . Hypertension   . Arthritis   . Diverticulosis   . Low back pain   . History of shingles   . PUD (peptic ulcer disease)   . Internal hemorrhoids   . Adenomatous polyp 11/23/2005  . GERD (gastroesophageal reflux disease)     Past Surgical History  Procedure Laterality Date  . 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 Marital Status: Divorced    Spouse Name: N/A    Number of Children: N/A  . Years of Education: N/A   Occupational History  . Cleaning    Social History Main Topics  . Smoking status: Former Games developer  . Smokeless tobacco: Never Used  . Alcohol Use: No  . Drug Use: No  . Sexual Activity: Yes   Other Topics Concern  . Not on file   Social History Narrative   3 glasses of tea daily     Current Outpatient Prescriptions on File Prior to Visit  Medication Sig Dispense Refill  . alprazolam (XANAX) 2 MG tablet TAKE ONE TABLET AT BEDTIME AS NEEDED FORSLEEP  30 tablet  3  . budesonide-formoterol (SYMBICORT) 160-4.5 MCG/ACT inhaler Inhale 2 puffs into the lungs 2 (two) times daily.      Marland Kitchen buPROPion (WELLBUTRIN XL) 150 MG 24 hr tablet Take 1 tablet (150 mg total) by mouth daily.  30 tablet  3  . calcitRIOL (ROCALTROL) 0.25 MCG capsule Take 1 capsule (0.25 mcg total) by mouth 3 (three) times a week.  15 capsule  0  . ibuprofen (MOTRIN IB) 200 MG tablet Take 200 mg by mouth. As needed        . levocetirizine (XYZAL) 5 MG tablet 1 tab every am  30 tablet  11  . methocarbamol (ROBAXIN) 500 MG tablet TAKE ONE TABLET THREE TIMES DAILY  30 tablet  1  .  methylPREDNIsolone (MEDROL DOSPACK) 4 MG tablet follow package directions  21 tablet  0  . nadolol (CORGARD) 40 MG tablet Take 1 tablet (40 mg total) by mouth daily.  30 tablet  11  . NEXIUM 40 MG capsule TAKE ONE CAPSULE EACH DAY  30 capsule  11  . Olmesartan-Amlodipine-HCTZ (TRIBENZOR) 40-5-12.5 MG TABS Take 1 capsule by mouth daily.  30 tablet  11   No current facility-administered medications on file prior to visit.    Allergies  Allergen Reactions  . Celexa [Citalopram Hydrobromide]     psychosis  . Cephalosporins     REACTION: tongue swelling  . Clarithromycin     REACTION: blisters in mouth  . Clarithromycin   . Doxycycline Hyclate     REACTION: nausea, vomiting  . Levofloxacin     REACTION: tongue swells  . Penicillins     REACTION: per patient causes rash,hives  . Zolpidem Tartrate     REACTION: difficulty with concentration    Family History  Problem Relation Age of Onset  . Heart disease Mother   . Hypertension Mother   .  Diabetes Mother   . COPD Father   . Colon cancer Neg Hx     BP 132/72  Pulse 71  Ht 5\' 2"  (1.575 m)  Wt 181 lb (82.101 kg)  BMI 33.1 kg/m2  SpO2 96%  Review of Systems She has nocturnal muscle cramps.    Objective:   Physical Exam VITAL SIGNS:  See vs page GENERAL: no distress NECK: There is no palpable thyroid enlargement.  No thyroid nodule is palpable.  No palpable lymphadenopathy at the anterior neck.     Assessment & Plan:  Secondary hyperparathyroidism, on rx

## 2013-07-28 ENCOUNTER — Telehealth: Payer: Self-pay | Admitting: Endocrinology

## 2013-07-28 MED ORDER — CALCITRIOL 0.25 MCG PO CAPS: 0.2500 ug | ORAL_CAPSULE | ORAL | Status: DC

## 2013-07-28 NOTE — Telephone Encounter (Signed)
Pt advised of results. 

## 2013-08-15 ENCOUNTER — Other Ambulatory Visit: Payer: Self-pay | Admitting: Family

## 2013-08-25 ENCOUNTER — Encounter: Payer: Self-pay | Admitting: Internal Medicine

## 2013-08-25 ENCOUNTER — Ambulatory Visit (INDEPENDENT_AMBULATORY_CARE_PROVIDER_SITE_OTHER): Payer: Medicare Other | Admitting: Internal Medicine

## 2013-08-25 VITALS — BP 135/80 | HR 72 | Temp 97.9°F | Resp 16 | Ht 62.0 in | Wt 173.0 lb

## 2013-08-25 DIAGNOSIS — T887XXA Unspecified adverse effect of drug or medicament, initial encounter: Secondary | ICD-10-CM

## 2013-08-25 DIAGNOSIS — I10 Essential (primary) hypertension: Secondary | ICD-10-CM

## 2013-08-25 DIAGNOSIS — E785 Hyperlipidemia, unspecified: Secondary | ICD-10-CM

## 2013-08-25 DIAGNOSIS — J45909 Unspecified asthma, uncomplicated: Secondary | ICD-10-CM

## 2013-08-25 DIAGNOSIS — R05 Cough: Secondary | ICD-10-CM

## 2013-08-25 DIAGNOSIS — R059 Cough, unspecified: Secondary | ICD-10-CM

## 2013-08-25 LAB — POCT URINALYSIS DIPSTICK
Bilirubin, UA: NEGATIVE
Blood, UA: NEGATIVE
Glucose, UA: NEGATIVE
Ketones, UA: NEGATIVE
Leukocytes, UA: NEGATIVE
Nitrite, UA: NEGATIVE
Urobilinogen, UA: 0.2
pH, UA: 5

## 2013-08-25 LAB — CBC WITH DIFFERENTIAL/PLATELET
Basophils Absolute: 0.1 10*3/uL (ref 0.0–0.1)
Eosinophils Absolute: 0.3 10*3/uL (ref 0.0–0.7)
Eosinophils Relative: 3 % (ref 0–5)
HCT: 42.7 % (ref 36.0–46.0)
Hemoglobin: 14.9 g/dL (ref 12.0–15.0)
Lymphocytes Relative: 35 % (ref 12–46)
Lymphs Abs: 3.8 10*3/uL (ref 0.7–4.0)
MCH: 30.7 pg (ref 26.0–34.0)
Monocytes Absolute: 1.1 10*3/uL — ABNORMAL HIGH (ref 0.1–1.0)
Neutrophils Relative %: 51 % (ref 43–77)
RBC: 4.85 MIL/uL (ref 3.87–5.11)
RDW: 13.9 % (ref 11.5–15.5)
WBC: 10.7 10*3/uL — ABNORMAL HIGH (ref 4.0–10.5)

## 2013-08-25 NOTE — Progress Notes (Signed)
Subjective:    Patient ID: Felicia Perry, female    DOB: 01-07-1946, 67 y.o.   MRN: [id number]  HPI weight issues! Asthma stable for fall with might allergies. Mid abdominal cramps and esophageal pains/gerd Constipation and straining with bowels. Back pain. Mild lower back radiating to the front   Review of Systems  Constitutional: Negative for activity change, appetite change and fatigue.  HENT: Negative for congestion, ear pain, postnasal drip and sinus pressure.   Eyes: Negative for redness and visual disturbance.  Respiratory: Positive for chest tightness and shortness of breath. Negative for cough and wheezing.   Cardiovascular: Positive for chest pain.  Gastrointestinal: Positive for abdominal pain and abdominal distention.  Endocrine: Negative.   Genitourinary: Negative.  Negative for dysuria, frequency and menstrual problem.  Musculoskeletal: Negative for arthralgias, joint swelling, myalgias and neck pain.  Skin: Negative for rash and wound.  Neurological: Negative for dizziness, weakness and headaches.  Hematological: Negative for adenopathy. Does not bruise/bleed easily.  Psychiatric/Behavioral: Negative for sleep disturbance and decreased concentration.   Past Medical History  Diagnosis Date  . Anxiety   . Asthma   . Hypertension   . Arthritis   . Diverticulosis   . Low back pain   . History of shingles   . PUD (peptic ulcer disease)   . Internal hemorrhoids   . Adenomatous polyp 11/23/2005  . GERD (gastroesophageal reflux disease)     History   Social History  . Marital Status: Divorced    Spouse Name: N/A    Number of Children: N/A  . Years of Education: N/A   Occupational History  . Cleaning    Social History Main Topics  . Smoking status: Former Games developer  . Smokeless tobacco: Never Used  . Alcohol Use: No  . Drug Use: No  . Sexual Activity: Yes   Other Topics Concern  . Not on file   Social History Narrative   3 glasses of tea daily      Past Surgical History  Procedure Laterality Date  . 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 Heart disease Mother   . Hypertension Mother   . Diabetes Mother   . COPD Father   . Colon cancer Neg Hx     Allergies  Allergen Reactions  . Celexa [Citalopram Hydrobromide]     psychosis  . Cephalosporins     REACTION: tongue swelling  . Clarithromycin     REACTION: blisters in mouth  . Clarithromycin   . Doxycycline Hyclate     REACTION: nausea, vomiting  . Levofloxacin     REACTION: tongue swells  . Penicillins     REACTION: per patient causes rash,hives  . Zolpidem Tartrate     REACTION: difficulty with concentration    Current Outpatient Prescriptions on File Prior to Visit  Medication Sig Dispense Refill  . alprazolam (XANAX) 2 MG tablet TAKE ONE TABLET AT BEDTIME AS NEEDED FORSLEEP  30 tablet  3  . budesonide-formoterol (SYMBICORT) 160-4.5 MCG/ACT inhaler Inhale 2 puffs into the lungs 2 (two) times daily.      Marland Kitchen buPROPion (WELLBUTRIN XL) 150 MG 24 hr tablet TAKE ONE TABLET EACH DAY  30 tablet  6  . calcitRIOL (ROCALTROL) 0.25 MCG capsule Take 1 capsule (0.25 mcg total) by mouth 3 (three) times a week.  15 capsule  3  . ibuprofen (MOTRIN IB) 200 MG  tablet Take 200 mg by mouth. As needed        . levocetirizine (XYZAL) 5 MG tablet 1 tab every am  30 tablet  11  . nadolol (CORGARD) 40 MG tablet Take 1 tablet (40 mg total) by mouth daily.  30 tablet  11  . Olmesartan-Amlodipine-HCTZ (TRIBENZOR) 40-5-12.5 MG TABS Take 1 capsule by mouth daily.  30 tablet  11   No current facility-administered medications on file prior to visit.    BP 135/80  Pulse 72  Temp(Src) 97.9 F (36.6 C)  Resp 16  Ht 5\' 2"  (1.575 m)  Wt 173 lb (78.472 kg)  BMI 31.63 kg/m2       Objective:   Physical Exam  Constitutional: She is oriented to person, place, and time. She appears well-developed. No distress.  HENT:   Head: Normocephalic.  Eyes: Conjunctivae and EOM are normal. Pupils are equal, round, and reactive to light.  Neck: Normal range of motion. Neck supple. No JVD present. No tracheal deviation present. No thyromegaly present.  Cardiovascular: Normal rate and regular rhythm.   Murmur heard. Pulmonary/Chest: Effort normal and breath sounds normal. She has no wheezes. She exhibits no tenderness.  Abdominal: Soft. Bowel sounds are normal. She exhibits distension. There is tenderness.  Musculoskeletal: Normal range of motion. She exhibits no edema and no tenderness.  Lymphadenopathy:    She has no cervical adenopathy.  Neurological: She is alert and oriented to person, place, and time. She has normal reflexes. No cranial nerve deficit.  Skin: Skin is warm and dry. She is not diaphoretic.  Psychiatric: She has a normal mood and affect. Her behavior is normal.          Assessment & Plan:  Had to go off the nexium and replaced with pepcid that has been insufficient. Has increased GERD symptoms. Needs to be PPI Blood pressure stable Back pain and l1-2 endplate disease. Use of fruit acid lotions

## 2013-08-25 NOTE — Patient Instructions (Addendum)
Skin creams with fruit acids that even tone and texture  Call if the back is still painfull and we will refer to dr Ethelene Hal

## 2013-08-26 LAB — BASIC METABOLIC PANEL
CO2: 31 mEq/L (ref 19–32)
Calcium: 10.6 mg/dL — ABNORMAL HIGH (ref 8.4–10.5)
Chloride: 100 mEq/L (ref 96–112)
Creat: 1.28 mg/dL — ABNORMAL HIGH (ref 0.50–1.10)
Glucose, Bld: 101 mg/dL — ABNORMAL HIGH (ref 70–99)
Sodium: 139 mEq/L (ref 135–145)

## 2013-08-26 LAB — HEPATIC FUNCTION PANEL
ALT: 13 U/L (ref 0–35)
Albumin: 4.4 g/dL (ref 3.5–5.2)
Alkaline Phosphatase: 90 U/L (ref 39–117)
Indirect Bilirubin: 0.3 mg/dL (ref 0.0–0.9)
Total Protein: 7.6 g/dL (ref 6.0–8.3)

## 2013-08-26 LAB — LIPID PANEL
HDL: 27 mg/dL — ABNORMAL LOW (ref >39)
LDL Cholesterol: 48 mg/dL (ref 0–99)
Triglycerides: 348 mg/dL — ABNORMAL HIGH (ref <150)

## 2013-09-05 ENCOUNTER — Encounter: Payer: Self-pay | Admitting: Internal Medicine

## 2013-09-14 ENCOUNTER — Other Ambulatory Visit: Payer: Self-pay | Admitting: Internal Medicine

## 2013-10-13 ENCOUNTER — Telehealth: Payer: Self-pay | Admitting: Internal Medicine

## 2013-10-13 ENCOUNTER — Other Ambulatory Visit: Payer: Self-pay | Admitting: *Deleted

## 2013-10-13 MED ORDER — DEXLANSOPRAZOLE 30 MG PO CPDR: 30.0000 mg | DELAYED_RELEASE_CAPSULE | Freq: Every day | ORAL | Status: DC

## 2013-10-13 NOTE — Telephone Encounter (Signed)
Running out of samples of Dexilant 60mg , requesting  rx sent to [facility].

## 2013-10-30 ENCOUNTER — Other Ambulatory Visit: Payer: Self-pay | Admitting: Internal Medicine

## 2013-11-13 ENCOUNTER — Other Ambulatory Visit: Payer: Self-pay | Admitting: Internal Medicine

## 2013-12-13 ENCOUNTER — Other Ambulatory Visit: Payer: Self-pay | Admitting: Internal Medicine

## 2014-01-05 ENCOUNTER — Encounter: Payer: Self-pay | Admitting: Internal Medicine

## 2014-01-05 ENCOUNTER — Ambulatory Visit (INDEPENDENT_AMBULATORY_CARE_PROVIDER_SITE_OTHER): Payer: Medicare Other | Admitting: Internal Medicine

## 2014-01-05 VITALS — BP 130/80 | HR 63 | Temp 98.0°F | Ht 62.0 in | Wt 175.0 lb

## 2014-01-05 DIAGNOSIS — I10 Essential (primary) hypertension: Secondary | ICD-10-CM

## 2014-01-05 DIAGNOSIS — Z23 Encounter for immunization: Secondary | ICD-10-CM

## 2014-01-05 DIAGNOSIS — J45909 Unspecified asthma, uncomplicated: Secondary | ICD-10-CM

## 2014-01-05 DIAGNOSIS — K219 Gastro-esophageal reflux disease without esophagitis: Secondary | ICD-10-CM

## 2014-01-05 DIAGNOSIS — R0681 Apnea, not elsewhere classified: Secondary | ICD-10-CM

## 2014-01-05 NOTE — Progress Notes (Signed)
Pre visit review using our clinic review tool, if applicable. No additional management support is needed unless otherwise documented below in the visit note. 

## 2014-01-05 NOTE — Progress Notes (Signed)
Subjective:    Patient ID: Felicia Perry, female    DOB: October 26, 1946, 68 y.o.   MRN: [id number]  HPI  Asthma has been stable Has been working on the back pain with weight loss and posture changes   Review of Systems  Constitutional: Negative for activity change, appetite change and fatigue.  HENT: Negative for congestion, ear pain, postnasal drip and sinus pressure.   Eyes: Negative for redness and visual disturbance.  Respiratory: Negative for cough, shortness of breath and wheezing.   Gastrointestinal: Negative for abdominal pain and abdominal distention.  Genitourinary: Negative for dysuria, frequency and menstrual problem.  Musculoskeletal: Negative for arthralgias, joint swelling, myalgias and neck pain.  Skin: Negative for rash and wound.  Neurological: Negative for dizziness, weakness and headaches.  Hematological: Negative for adenopathy. Does not bruise/bleed easily.  Psychiatric/Behavioral: Negative for sleep disturbance and decreased concentration.   Past Medical History  Diagnosis Date  . Anxiety   . Asthma   . Hypertension   . Arthritis   . Diverticulosis   . Low back pain   . History of shingles   . PUD (peptic ulcer disease)   . Internal hemorrhoids   . Adenomatous polyp 11/23/2005  . GERD (gastroesophageal reflux disease)     History   Social History  . Marital Status: Divorced    Spouse Name: N/A    Number of Children: N/A  . Years of Education: N/A   Occupational History  . Cleaning    Social History Main Topics  . Smoking status: Former Research scientist (life sciences)  . Smokeless tobacco: Never Used  . Alcohol Use: No  . Drug Use: No  . Sexual Activity: Yes   Other Topics Concern  . Not on file   Social History Narrative   3 glasses of tea daily     Past Surgical History  Procedure Laterality Date  . 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 Heart disease Mother   . Hypertension  Mother   . Diabetes Mother   . COPD Father   . Colon cancer Neg Hx     Allergies  Allergen Reactions  . Celexa [Citalopram Hydrobromide]     psychosis  . Cephalosporins     REACTION: tongue swelling  . Clarithromycin     REACTION: blisters in mouth  . Clarithromycin   . Doxycycline Hyclate     REACTION: nausea, vomiting  . Levofloxacin     REACTION: tongue swells  . Penicillins     REACTION: per patient causes rash,hives  . Zolpidem Tartrate     REACTION: difficulty with concentration    Current Outpatient Prescriptions on File Prior to Visit  Medication Sig Dispense Refill  . alprazolam (XANAX) 2 MG tablet TAKE ONE TABLET AT BEDTIME AS NEEDED FORSLEEP  30 tablet  0  . budesonide-formoterol (SYMBICORT) 160-4.5 MCG/ACT inhaler Inhale 2 puffs into the lungs 2 (two) times daily.      Marland Kitchen buPROPion (WELLBUTRIN XL) 150 MG 24 hr tablet TAKE ONE TABLET EACH DAY  30 tablet  6  . calcitRIOL (ROCALTROL) 0.25 MCG capsule Take 1 capsule (0.25 mcg total) by mouth 3 (three) times a week.  15 capsule  3  . Dexlansoprazole 30 MG capsule Take 1 capsule (30 mg total) by mouth daily.  30 capsule  3  . ibuprofen (MOTRIN IB) 200 MG tablet Take 200 mg by mouth. As  needed        . levocetirizine (XYZAL) 5 MG tablet 1 tab every am  30 tablet  11  . nadolol (CORGARD) 40 MG tablet TAKE ONE TABLET EVERY DAY  30 tablet  6  . TRIBENZOR 40-5-12.5 MG TABS TAKE ONE TABLET BY MOUTH ONCE DAILY  30 tablet  11   No current facility-administered medications on file prior to visit.    BP 130/80  Pulse 63  Temp(Src) 98 F (36.7 C) (Oral)  Ht 5\' 2"  (1.575 m)  Wt 175 lb (79.379 kg)  BMI 32.00 kg/m2  SpO2 96%       Objective:   Physical Exam  Constitutional: She is oriented to person, place, and time. She appears well-developed and well-nourished. No distress.  HENT:  Head: Normocephalic and atraumatic.  Mouth/Throat: Oropharynx is clear and moist.  Eyes: Conjunctivae and EOM are normal. Pupils are  equal, round, and reactive to light.  Neck: Normal range of motion. Neck supple. No JVD present. No tracheal deviation present. No thyromegaly present.  Cardiovascular: Normal rate and regular rhythm.   Murmur heard. Pulmonary/Chest: Effort normal and breath sounds normal. She has no wheezes. She exhibits no tenderness.  Abdominal: Soft. Bowel sounds are normal.  Musculoskeletal: Normal range of motion. She exhibits no edema and no tenderness.  Lymphadenopathy:    She has no cervical adenopathy.  Neurological: She is alert and oriented to person, place, and time. She has normal reflexes. No cranial nerve deficit.  Skin: Skin is warm and dry. She is not diaphoretic.  Psychiatric: She has a normal mood and affect. Her behavior is normal.          Assessment & Plan:  discussion of weight loss strategies Back exercises and  Posture work The dexilant was approved Stable on the 30 for the acid reflux  TD and prevnar  Today Stable HTN

## 2014-01-05 NOTE — Addendum Note (Signed)
Addended by: Julieta Bellini on: 01/05/2014 04:33 PM   Modules accepted: Orders

## 2014-01-05 NOTE — Patient Instructions (Signed)
The patient is instructed to continue all medications as prescribed. Schedule followup with check out clerk upon leaving the clinic  

## 2014-01-08 ENCOUNTER — Telehealth: Payer: Self-pay | Admitting: Internal Medicine

## 2014-01-08 NOTE — Telephone Encounter (Signed)
Relevant patient education mailed to patient.  

## 2014-01-10 ENCOUNTER — Other Ambulatory Visit: Payer: Self-pay | Admitting: Internal Medicine

## 2014-01-10 ENCOUNTER — Other Ambulatory Visit: Payer: Self-pay | Admitting: Endocrinology

## 2014-01-10 NOTE — Telephone Encounter (Signed)
Pt following up on request for alprazolam Duanne Moron) 2 MG tablet Pt is out of this med. Pt states she thought Dr Arnoldo Morale was going to fill this after her appt on 3/13. Pt anxious about not having this med.

## 2014-02-19 ENCOUNTER — Telehealth: Payer: Self-pay | Admitting: Internal Medicine

## 2014-02-19 NOTE — Telephone Encounter (Signed)
Pt received her letter from dr. Arnoldo Morale, and is requesting a consultation with dr. Arnoldo Morale, pt states she must speak with dr. Arnoldo Morale to help her figure out who is best sued to take her as a pt.

## 2014-02-19 NOTE — Telephone Encounter (Signed)
I believe she will get along well with Dr Yong Channel

## 2014-02-19 NOTE — Telephone Encounter (Signed)
Patient is aware 

## 2014-02-19 NOTE — Telephone Encounter (Signed)
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

## 2014-03-14 ENCOUNTER — Encounter: Payer: Self-pay | Admitting: Physician Assistant

## 2014-03-14 ENCOUNTER — Ambulatory Visit (INDEPENDENT_AMBULATORY_CARE_PROVIDER_SITE_OTHER): Payer: Medicare Other | Admitting: Physician Assistant

## 2014-03-14 VITALS — BP 140/80 | HR 67 | Temp 98.6°F | Resp 20 | Wt 178.0 lb

## 2014-03-14 DIAGNOSIS — M545 Low back pain, unspecified: Secondary | ICD-10-CM

## 2014-03-14 DIAGNOSIS — R52 Pain, unspecified: Secondary | ICD-10-CM

## 2014-03-14 DIAGNOSIS — G8929 Other chronic pain: Secondary | ICD-10-CM

## 2014-03-14 MED ORDER — TRAMADOL HCL 50 MG PO TABS: 50.0000 mg | ORAL_TABLET | Freq: Three times a day (TID) | ORAL | Status: DC | PRN

## 2014-03-14 MED ORDER — METAXALONE 800 MG PO TABS: 800.0000 mg | ORAL_TABLET | Freq: Three times a day (TID) | ORAL | Status: DC

## 2014-03-14 MED ORDER — LEVOCETIRIZINE DIHYDROCHLORIDE 5 MG PO TABS: ORAL_TABLET | ORAL | Status: DC

## 2014-03-14 NOTE — Progress Notes (Signed)
Subjective:    Patient ID: Felicia Perry, female    DOB: April 05, 1946, 68 y.o.   MRN: [id number]  Back Pain   Patient is a 68 year old Caucasian female with a history of chronic low back pain presenting to the clinic for an acute exacerbation of back pain. Patient states that for the past week and a half she has been having 10/10 pain that radiates down her right leg and has associated numbness and tingling in the right leg. The patient has been limping and favoring the right side by using her left side more, which has caused her left knee osteoarthritis to hurt. The patient has not had any injury, and has no explanation for the acute exacerbation. The patient has chronic back pain, however she had managed this very well using alternative methods of pain relief such as dry needling, and hadn't been bothered by the pain for many months. The patient had an MRI of her lumbar spine 10 years ago per the patient which showed slipped discs of the lumbar spine. She had an x-ray of her low back about a half year ago which showed bulging discs. She is not had any surgery on her low back. She states that she has been using Aleve and Advil and Tylenol simultaneously to manage her back pain for the past week and a half. She denies fevers, chills, nausea, vomiting, diarrhea, and shortness of breath.   Review of Systems  Musculoskeletal: Positive for back pain.   As per the history of present illness and are otherwise negative.  Past Medical History  Diagnosis Date  . Anxiety   . Asthma   . Hypertension   . Arthritis   . Diverticulosis   . Low back pain   . History of shingles   . PUD (peptic ulcer disease)   . Internal hemorrhoids   . Adenomatous polyp 11/23/2005  . GERD (gastroesophageal reflux disease)    Past Surgical History  Procedure Laterality Date  . Breast surgery      Fibrous Tumors revomed on left breast     reports that she has quit smoking. She has never used smokeless tobacco. She  reports that she does not drink alcohol or use illicit drugs. family history includes COPD in her father; Diabetes in her mother; Heart disease in her mother; Hypertension in her mother. There is no history of Colon cancer. Allergies  Allergen Reactions  . Celexa [Citalopram Hydrobromide]     psychosis  . Cephalosporins     REACTION: tongue swelling  . Clarithromycin     REACTION: blisters in mouth  . Clarithromycin   . Doxycycline Hyclate     REACTION: nausea, vomiting  . Levofloxacin     REACTION: tongue swells  . Penicillins     REACTION: per patient causes rash,hives  . Zolpidem Tartrate     REACTION: difficulty with concentration       Objective:   Physical Exam  Nursing note and vitals reviewed. Constitutional: She is oriented to person, place, and time. She appears well-developed and well-nourished. No distress.  HENT:  Head: Normocephalic and atraumatic.  Eyes: Conjunctivae and EOM are normal. Pupils are equal, round, and reactive to light.  Neck: Normal range of motion. Neck supple.  Cardiovascular: Normal rate, regular rhythm, normal heart sounds and intact distal pulses.  Exam reveals no gallop and no friction rub.   No murmur heard. Pulmonary/Chest: Effort normal and breath sounds normal. No respiratory distress. She has no wheezes. She has  no rales. She exhibits no tenderness.  Musculoskeletal: Normal range of motion. She exhibits tenderness (tenderness to palpation of the right gluteal region.). She exhibits no edema.  Patient has normal passive range of motion. Active range of motion slightly limited due to pain. There is no bony tenderness of the lumbar spine. There is no paraspinal muscle tenderness in the lumbar region.   Neurological: She is alert and oriented to person, place, and time. She displays abnormal reflex (left leg DTRs 3+, right leg DTRs 1+). No cranial nerve deficit. She exhibits abnormal muscle tone (decreased strength in the right lower  extremity versus the left). Coordination (patient limping slightly favoring the right side) abnormal.  Positive straight leg raise on the right side  Skin: Skin is warm and dry. No rash noted. She is not diaphoretic. No erythema. No pallor.  Psychiatric: She has a normal mood and affect. Her behavior is normal. Judgment and thought content normal.   Filed Vitals:   03/14/14 1531  BP: 140/80  Pulse: 67  Temp: 98.6 F (37 C)  Resp: 20    Lab Results  Component Value Date   WBC 10.7* 08/25/2013   HGB 14.9 08/25/2013   HCT 42.7 08/25/2013   PLT 317 08/25/2013   GLUCOSE 101* 08/25/2013   CHOL 145 08/25/2013   TRIG 348* 08/25/2013   HDL 27* 08/25/2013   LDLDIRECT 91.9 02/28/2007   LDLCALC 48 08/25/2013   ALT 13 08/25/2013   AST 17 08/25/2013   NA 139 08/25/2013   K 4.8 08/25/2013   CL 100 08/25/2013   CREATININE 1.28* 08/25/2013   BUN 24* 08/25/2013   CO2 31 08/25/2013   TSH 1.568 08/25/2013      Assessment & Plan:  Felicia Perry was seen today for back pain and sciatic nerve pain.  Diagnoses and associated orders for this visit:  Acute exacerbation of chronic low back pain - This does appear to be related to disc changes of the lumbar spine, and possibly sciatica, however the pt has never had sciatica before, and this pain is not similar to any pack pain she has experienced before. Will get MRI to check progression of lumber degeneration and disc slippage. - MR Lumbar Spine Wo Contrast; Future - traMADol (ULTRAM) 50 MG tablet; Take 1 tablet (50 mg total) by mouth every 8 (eight) hours as needed. - metaxalone (SKELAXIN) 800 MG tablet; Take 1 tablet (800 mg total) by mouth 3 (three) times daily.  Chronic low back pain - MR Lumbar Spine Wo Contrast; Future - traMADol (ULTRAM) 50 MG tablet; Take 1 tablet (50 mg total) by mouth every 8 (eight) hours as needed. - metaxalone (SKELAXIN) 800 MG tablet; Take 1 tablet (800 mg total) by mouth 3 (three) times daily.  Other  Orders - levocetirizine (XYZAL) 5 MG tablet; 1 tab every am   Plan to follow up in 1 week to reassess.   Patient Instructions  You'll be called to schedule an appointment to have your MRI done. We will call you with the results of this once it is completed.  Skelaxin 3 times a day for back pain related to muscle spasm/tightness.  Ultram 3 times a day as needed for back pain.  Follow up in 1 week to reassess.

## 2014-03-14 NOTE — Patient Instructions (Signed)
You'll be called to schedule an appointment to have your MRI done. We will call you with the results of this once it is completed.  Skelaxin 3 times a day for back pain related to muscle spasm/tightness.  Ultram 3 times a day as needed for back pain.  Follow up in 1 week to reassess.  Back Pain, Adult Back pain is very common. The pain often gets better over time. The cause of back pain is usually not dangerous. Most people can learn to manage their back pain on their own.  HOME CARE   Stay active. Start with short walks on flat ground if you can. Try to walk farther each day.  Do not sit, drive, or stand in one place for more than 30 minutes. Do not stay in bed.  Do not avoid exercise or work. Activity can help your back heal faster.  Be careful when you bend or lift an object. Bend at your knees, keep the object close to you, and do not twist.  Sleep on a firm mattress. Lie on your side, and bend your knees. If you lie on your back, put a pillow under your knees.  Only take medicines as told by your doctor.  Put ice on the injured area.  Put ice in a plastic bag.  Place a towel between your skin and the bag.  Leave the ice on for 15-20 minutes, 03-04 times a day for the first 2 to 3 days. After that, you can switch between ice and heat packs.  Ask your doctor about back exercises or massage.  Avoid feeling anxious or stressed. Find good ways to deal with stress, such as exercise. GET HELP RIGHT AWAY IF:   Your pain does not go away with rest or medicine.  Your pain does not go away in 1 week.  You have new problems.  You do not feel well.  The pain spreads into your legs.  You cannot control when you poop (bowel movement) or pee (urinate).  Your arms or legs feel weak or lose feeling (numbness).  You feel sick to your stomach (nauseous) or throw up (vomit).  You have belly (abdominal) pain.  You feel like you may pass out (faint). MAKE SURE YOU:    Understand these instructions.  Will watch your condition.  Will get help right away if you are not doing well or get worse. Document Released: 03/30/2008 Document Revised: 01/04/2012 Document Reviewed: 03/02/2011 [facility] Patient Information 2014 [place].

## 2014-03-15 ENCOUNTER — Telehealth: Payer: Self-pay

## 2014-03-15 DIAGNOSIS — M545 Low back pain, unspecified: Secondary | ICD-10-CM

## 2014-03-15 DIAGNOSIS — G8929 Other chronic pain: Secondary | ICD-10-CM

## 2014-03-15 MED ORDER — METHOCARBAMOL 500 MG PO TABS: 500.0000 mg | ORAL_TABLET | Freq: Three times a day (TID) | ORAL | Status: DC

## 2014-03-15 NOTE — Telephone Encounter (Signed)
Reiceved a fax from [employer] stating that skelaxin was not covered by [employer] (generic is over 300.00).  Pt is requesting to change to a different drug- maybe Robaxin.  Pls advise.   Ok per Rodman Key to change to Robaxin 500 tid.  Pt may only need 2 per day.  Called and left a message for pt to return call.

## 2014-03-16 NOTE — Telephone Encounter (Signed)
Called and spoke with pt and pt is aware.  

## 2014-03-16 NOTE — Telephone Encounter (Signed)
Pt has  MRI scheduled for 03/23/14 at 6 am; pt changed appt until 03/27/14 at 1 pm so that you all can discuss results.

## 2014-03-16 NOTE — Telephone Encounter (Signed)
Called and left a message for return call.  

## 2014-03-21 ENCOUNTER — Ambulatory Visit: Payer: Medicare Other | Admitting: Physician Assistant

## 2014-03-23 ENCOUNTER — Ambulatory Visit
Admission: RE | Admit: 2014-03-23 | Discharge: 2014-03-23 | Disposition: A | Discharge status: Home / Self Care | Payer: Medicare Other | Source: Ambulatory Visit | Attending: Physician Assistant | Admitting: Physician Assistant

## 2014-03-23 DIAGNOSIS — M545 Low back pain, unspecified: Secondary | ICD-10-CM

## 2014-03-23 DIAGNOSIS — G8929 Other chronic pain: Secondary | ICD-10-CM

## 2014-03-27 ENCOUNTER — Encounter: Payer: Self-pay | Admitting: Physician Assistant

## 2014-03-27 ENCOUNTER — Ambulatory Visit (INDEPENDENT_AMBULATORY_CARE_PROVIDER_SITE_OTHER): Payer: Medicare Other | Admitting: Physician Assistant

## 2014-03-27 VITALS — BP 130/70 | Temp 98.9°F | Wt 176.0 lb

## 2014-03-27 DIAGNOSIS — R52 Pain, unspecified: Secondary | ICD-10-CM

## 2014-03-27 DIAGNOSIS — M545 Low back pain, unspecified: Secondary | ICD-10-CM

## 2014-03-27 DIAGNOSIS — G8929 Other chronic pain: Secondary | ICD-10-CM

## 2014-03-27 MED ORDER — METHYLPREDNISOLONE ACETATE 80 MG/ML IJ SUSP
80.0000 mg | Freq: Once | INTRAMUSCULAR | Status: AC
  Administered 2014-03-27: 80 mg via INTRAMUSCULAR

## 2014-03-27 MED ORDER — DEXLANSOPRAZOLE 30 MG PO CPDR: 30.0000 mg | DELAYED_RELEASE_CAPSULE | Freq: Every day | ORAL | Status: DC

## 2014-03-27 MED ORDER — TRAMADOL HCL 50 MG PO TABS: 50.0000 mg | ORAL_TABLET | Freq: Three times a day (TID) | ORAL | Status: DC | PRN

## 2014-03-27 MED ORDER — BUPROPION HCL ER (XL) 150 MG PO TB24: ORAL_TABLET | ORAL | Status: DC

## 2014-03-27 NOTE — Patient Instructions (Signed)
Continue tramadol as prescribed for pain.  This may take several weeks before they completely resolves.  Followup as needed, or if symptoms worsen or fail to improve despite treatment.    Back Injury Prevention The following tips can help you to prevent a back injury. PHYSICAL FITNESS  Exercise often. Try to develop strong stomach (abdominal) muscles.  Do aerobic exercises often. This includes walking, jogging, biking, swimming.  Do exercises that help with balance and strength often. This includes tai chi and yoga.  Stretch before and after you exercise.  Keep a healthy weight. DIET   Ask your doctor how much calcium and vitamin D you need every day.  Include calcium in your diet. Foods high in calcium include dairy products; green, leafy vegetables; and products with calcium added (fortified).  Include vitamin D in your diet. Foods high in vitamin D include milk and products with vitamin D added.  Think about taking a multivitamin or other nutritional products called " supplements."  Stop smoking if you smoke. POSTURE   Sit and stand up straight. Avoid leaning forward or hunching over.  Choose chairs that support your lower back.  If you work at a desk:  Sit close to your work so you do not lean over.  Keep your chin tucked in.  Keep your neck drawn back.  Keep your elbows bent at a right angle. Your arms should look like the letter "L."  Sit high and close to the steering wheel when you drive. Add low back support to your car seat if needed.  Avoid sitting or standing in one position for too long. Get up and move around every hour. Take breaks if you are driving for a long time.  Sleep on your side with your knees slightly bent. You can also sleep on your back with a pillow under your knees. Do not sleep on your stomach. LIFTING, TWISTING, AND REACHING  Avoid heavy lifting, especially lifting over and over again. If you must do heavy lifting:  Stretch  before lifting.  Work slowly.  Rest between lifts.  Use carts and dollies to move objects when possible.  Make several small trips instead of carrying 1 heavy load.  Ask for help when you need it.  Ask for help when moving big, awkward objects.  Follow these steps when lifting:  Stand with your feet shoulder-width apart.  Get as close to the object as you can. Do not pick up heavy objects that are far from your body.  Use handles or lifting straps when possible.  Bend at your knees. Squat down, but keep your heels off the floor.  Keep your shoulders back, your chin tucked in, and your back straight.  Lift the object slowly. Tighten the muscles in your legs, stomach, and butt. Keep the object as close to the center of your body as possible.  Reverse these directions when you put a load down.  Do not:  Lift the object above your waist.  Twist at the waist while lifting or carrying a load. Move your feet if you need to turn, not your waist.  Bend over without bending at your knees.  Avoid reaching over your head, across a table, or for an object on a high surface. OTHER TIPS  Avoid wet floors and keep sidewalks clear of ice.  Do not sleep on a mattress that is too soft or too hard.  Keep items that you use often within easy reach.  Put heavier objects on shelves  at waist level. Put lighter objects on lower or higher shelves.  Find ways to lessen your stress. You can try exercise, massage, or relaxation.  Get help for depression or anxiety if needed. GET HELP IF:  You injure your back.  You have questions about diet, exercise, or other ways to prevent back injuries. MAKE SURE YOU:  Understand these instructions.  Will watch your condition.  Will get help right away if you are not doing well or get worse. Document Released: 03/30/2008 Document Revised: 01/04/2012 Document Reviewed: 11/23/2011 [facility] Patient Information 2014 [place].

## 2014-03-27 NOTE — Progress Notes (Signed)
Subjective:    Patient ID: Felicia Perry, female    DOB: October 11, 1946, 68 y.o.   MRN: [id number]  HPI This is a 68 year old Caucasian female presenting to the clinic for followup of back pain. Patient was seen 2 weeks ago and diagnosed with an acute exacerbation of back pain and prescribed Robaxin and tramadol for the pain. At that time the pain was thought to be sciatica, and the patient was offered prednisone, however she refused due to bad experience with weight gain from prednisone in the past. Patient presents today stating that the medication has helped her manage the pain better, however it is still present. She states that she has slightly less pain now, however it is the same, shooting down her right leg, with occasional numbness and tingling in her right leg. She states that it has been causing her to have difficulty sitting for long period of time or walking for long period of time. Otherwise, she states that her range of motion is not affected. She had an MRI done of her back last week which did not show any acute changes. At this time she is wondering if prednisone can still be beneficial. She has not tried anything else for the symptoms. She denies fevers, chills, nausea, vomiting, diarrhea, and shortness of breath.   Review of Systems As per the history of present illness and are otherwise negative.   Past Medical History  Diagnosis Date  . Anxiety   . Asthma   . Hypertension   . Arthritis   . Diverticulosis   . Low back pain   . History of shingles   . PUD (peptic ulcer disease)   . Internal hemorrhoids   . Adenomatous polyp 11/23/2005  . GERD (gastroesophageal reflux disease)    Past Surgical History  Procedure Laterality Date  . Breast surgery      Fibrous Tumors revomed on left breast     reports that she has quit smoking. She has never used smokeless tobacco. She reports that she does not drink alcohol or use illicit drugs. family history includes COPD in her  father; Diabetes in her mother; Heart disease in her mother; Hypertension in her mother. There is no history of Colon cancer. Allergies  Allergen Reactions  . Celexa [Citalopram Hydrobromide]     psychosis  . Cephalosporins     REACTION: tongue swelling  . Clarithromycin     REACTION: blisters in mouth  . Clarithromycin   . Doxycycline Hyclate     REACTION: nausea, vomiting  . Levofloxacin     REACTION: tongue swells  . Penicillins     REACTION: per patient causes rash,hives  . Zolpidem Tartrate     REACTION: difficulty with concentration       Objective:   Physical Exam  Nursing note and vitals reviewed. Constitutional: She is oriented to person, place, and time. She appears well-developed and well-nourished. No distress.  HENT:  Head: Normocephalic and atraumatic.  Eyes: Conjunctivae and EOM are normal. Pupils are equal, round, and reactive to light.  Neck: Normal range of motion. Neck supple.  Cardiovascular: Normal rate, regular rhythm, normal heart sounds and intact distal pulses.  Exam reveals no gallop and no friction rub.   No murmur heard. Pulmonary/Chest: Effort normal and breath sounds normal. No respiratory distress. She has no wheezes. She has no rales. She exhibits no tenderness.  Musculoskeletal: Normal range of motion. She exhibits tenderness (tenderness to the right gluteal region over the ischial tuberosity.). She  exhibits no edema.  Patient has normal passive range of motion. Active range of motion slightly limited due to pain. There is no bony tenderness of the lumbar spine. There is no paraspinal muscle tenderness in the lumbar region.  Neurological: She is alert and oriented to person, place, and time. She displays abnormal reflex. No cranial nerve deficit. She exhibits abnormal muscle tone. Coordination normal.  Left leg DTR 3+, right leg 1+ Muscle tone left leg slightly better than right leg. Gait is normal Positive SLR on the Right.  Skin: Skin is  warm and dry. No rash noted. She is not diaphoretic. No erythema. No pallor.  Psychiatric: She has a normal mood and affect. Her behavior is normal. Judgment and thought content normal.     Filed Vitals:   03/27/14 1303  BP: 130/70  Temp: 98.9 F (37.2 C)   Lab Results  Component Value Date   WBC 10.7* 08/25/2013   HGB 14.9 08/25/2013   HCT 42.7 08/25/2013   PLT 317 08/25/2013   GLUCOSE 101* 08/25/2013   CHOL 145 08/25/2013   TRIG 348* 08/25/2013   HDL 27* 08/25/2013   LDLDIRECT 91.9 02/28/2007   LDLCALC 48 08/25/2013   ALT 13 08/25/2013   AST 17 08/25/2013   NA 139 08/25/2013   K 4.8 08/25/2013   CL 100 08/25/2013   CREATININE 1.28* 08/25/2013   BUN 24* 08/25/2013   CO2 31 08/25/2013   TSH 1.568 08/25/2013    MRI LUMBAR SPINE WITHOUT CONTRAST   IMPRESSION:  S1 is a lumbarized disc.  Mild spinal stenosis L3-4.  2 mm anterior slip L4-5 without significant spinal stenosis  5 mm anterior slip L4-5. The facet joints appear fused at this  level. No significant spinal stenosis.       Assessment & Plan:  Felicia Perry was seen today for follow-up.  Diagnoses and associated orders for this visit:  Acute exacerbation of chronic low back pain Comments: Some improvement on tramadol and robaxin. Gait improved. Will try depomedrol and continue tramadol. - traMADol (ULTRAM) 50 MG tablet; Take 1 tablet (50 mg total) by mouth every 8 (eight) hours as needed. - methylPREDNISolone acetate (DEPO-MEDROL) injection 80 mg; Inject 1 mL (80 mg total) into the muscle once.  Other Orders - buPROPion (WELLBUTRIN XL) 150 MG 24 hr tablet; TAKE ONE TABLET EACH DAY - Dexlansoprazole 30 MG capsule; Take 1 capsule (30 mg total) by mouth daily.   Consider PT referral if symptoms persist after treatment.  Plan to follow up as needed.   Patient Instructions  Continue tramadol as prescribed for pain.  This may take several weeks before they completely resolves.  Followup as needed, or if  symptoms worsen or fail to improve despite treatment.

## 2014-03-27 NOTE — Progress Notes (Signed)
Pre visit review using our clinic review tool, if applicable. No additional management support is needed unless otherwise documented below in the visit note. 

## 2014-05-02 ENCOUNTER — Other Ambulatory Visit: Payer: Self-pay | Admitting: Endocrinology

## 2014-06-11 ENCOUNTER — Other Ambulatory Visit: Payer: Self-pay | Admitting: Internal Medicine

## 2014-07-03 ENCOUNTER — Ambulatory Visit: Payer: Medicare Other | Admitting: Family Medicine

## 2014-07-12 ENCOUNTER — Other Ambulatory Visit: Payer: Self-pay | Admitting: Internal Medicine

## 2014-07-12 ENCOUNTER — Other Ambulatory Visit: Payer: Self-pay | Admitting: Physician Assistant

## 2014-07-12 ENCOUNTER — Other Ambulatory Visit: Payer: Self-pay | Admitting: Endocrinology

## 2014-07-12 NOTE — Telephone Encounter (Signed)
What is the protocol again for this? I have not seen her for either of these medications.

## 2014-07-13 ENCOUNTER — Ambulatory Visit (INDEPENDENT_AMBULATORY_CARE_PROVIDER_SITE_OTHER): Payer: Medicare Other | Admitting: Family Medicine

## 2014-07-13 ENCOUNTER — Ambulatory Visit: Payer: Medicare Other | Admitting: Internal Medicine

## 2014-07-13 ENCOUNTER — Encounter: Payer: Self-pay | Admitting: Family Medicine

## 2014-07-13 VITALS — BP 140/60 | HR 64 | Temp 99.0°F | Wt 177.0 lb

## 2014-07-13 DIAGNOSIS — M79609 Pain in unspecified limb: Secondary | ICD-10-CM

## 2014-07-13 DIAGNOSIS — F3289 Other specified depressive episodes: Secondary | ICD-10-CM

## 2014-07-13 DIAGNOSIS — F32A Depression, unspecified: Secondary | ICD-10-CM

## 2014-07-13 DIAGNOSIS — M2560 Stiffness of unspecified joint, not elsewhere classified: Secondary | ICD-10-CM | POA: Insufficient documentation

## 2014-07-13 DIAGNOSIS — R29898 Other symptoms and signs involving the musculoskeletal system: Secondary | ICD-10-CM

## 2014-07-13 DIAGNOSIS — R21 Rash and other nonspecific skin eruption: Secondary | ICD-10-CM | POA: Insufficient documentation

## 2014-07-13 DIAGNOSIS — G4700 Insomnia, unspecified: Secondary | ICD-10-CM | POA: Insufficient documentation

## 2014-07-13 DIAGNOSIS — M79602 Pain in left arm: Secondary | ICD-10-CM

## 2014-07-13 DIAGNOSIS — F329 Major depressive disorder, single episode, unspecified: Secondary | ICD-10-CM

## 2014-07-13 DIAGNOSIS — I10 Essential (primary) hypertension: Secondary | ICD-10-CM

## 2014-07-13 MED ORDER — CALCITRIOL 0.25 MCG PO CAPS: ORAL_CAPSULE | ORAL | Status: DC

## 2014-07-13 MED ORDER — ALPRAZOLAM 2 MG PO TABS: ORAL_TABLET | ORAL | Status: DC

## 2014-07-13 NOTE — Assessment & Plan Note (Signed)
I refilled Xanax and asked patient to try to take a half instead of a full tab.

## 2014-07-13 NOTE — Assessment & Plan Note (Signed)
Discussed with patient that coming off Wellbutrin would be a possibility. I asked her to return to discuss full psychiatric history as it was difficult to determine true course of her disease today.

## 2014-07-13 NOTE — Patient Instructions (Addendum)
Keba Flu today Give patient handout for mammogram and Dexa at the breast center  Limited right arm mobility- refer you to Dr. Charlann Boxer  Refills provided  i agree with you that weight loss could help you a great deal. You agreed to research water aerobics and look into starting with weight watchers.   Blood pressure-when you are a few weeks before running out of tribenzor, come see me and we can discuss titrating off.   Depression-could consider trial off of medicine but I would need a visit to go through your depression history.   Please find out which eye doctors you can see. Send me a message or call and I can place referral or give recommendation.   4 week follow up to talk about potential about coming off wellbutrin.

## 2014-07-13 NOTE — Progress Notes (Signed)
Garret Reddish, MD Phone: [phone]  Subjective:  Patient presents today to establish care with me as their new primary care provider. Patient was formerly a patient of Dr. Arnoldo Morale. Chief complaint-noted.   MSK concerns numbness in right leg for 4-5 years, cannot bend right arm fully, low back pain with 2 slipped discs for 10-12 years. The most concerning symptom to her is the fact she cannot fully flex her right arm and gets pain around her elbow when she does so. She states she has some spurs below her left shoulder blade and wonders if this is related ROS- no upper or lower extremity weakness. No fecal incontinence.   Rash Fiery red rash just above ankles in a band halfway around ankle on both sides. Going on for 10 years. Does sting or burn.  ROS-not ill appearing, no fever/chills. No new medications. Not immunocompromised. No mucus membrane involvement.   Hypertension-stable given goal 716 systolic for age BP Readings from Last 3 Encounters:  07/13/14 140/60  03/27/14 130/70  03/14/14 140/80  Home BP monitoring-no Compliant with medications-yes without side effects ROS-Denies any CP, HA, SOB, blurry vision, LE edema, transient weakness, orthopnea, PND.   Depression -States this resolved around son when he started with narcotic addiction. She feels this is well controlled now interested in coming off medication.  ROS-no SI HI  Insomnia-stable Has been stable on Xanax for years. I Discussed patient on large dose and would like to decrease. ROS-noted to Jeneen Rinks  The following were reviewed and entered/updated in epic: Past Medical History  Diagnosis Date  . Anxiety   . Asthma   . Hypertension   . Arthritis   . Diverticulosis   . Low back pain   . History of shingles   . PUD (peptic ulcer disease)   . Internal hemorrhoids   . Adenomatous polyp 11/23/2005  . GERD (gastroesophageal reflux disease)   . DIVERTICULOSIS, COLON 04/22/2007         Patient Active Problem List    Diagnosis Date Noted  . Insomnia 07/13/2014    Priority: Medium  . Secondary hyperparathyroidism 08/19/2012    Priority: Medium  . Depression 04/22/2007    Priority: Medium  . HYPERTENSION 04/22/2007    Priority: Medium  . ASTHMA 04/22/2007    Priority: Medium  . Rash 07/13/2014    Priority: Low  . GERD with apnea 02/25/2011    Priority: Low  . CERVICAL DISC DISORDER 10/10/2007    Priority: Low  . LOW BACK PAIN 07/01/2007    Priority: Low  . Limited joint range of motion 07/13/2014   Past Surgical History  Procedure Laterality Date  . 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 Cataract surgery      Family History  Problem Relation Age of Onset  . Heart disease Mother   . Hypertension Mother   . Diabetes Mother   . COPD Father   . Colon cancer Neg Hx     Medications- reviewed and updated Current Outpatient Prescriptions  Medication Sig Dispense Refill  . alprazolam (XANAX) 2 MG tablet TAKE 1/2 to ONE TABLET AT BEDTIME AS NEEDED FORSLEEP  30 tablet  5  . buPROPion (WELLBUTRIN XL) 150 MG 24 hr tablet TAKE ONE TABLET EACH DAY  30 tablet  6  . calcitRIOL (ROCALTROL) 0.25 MCG capsule TAKE ONE CAPSULE THREE TIMES PER WEEK  15 capsule  5  . Dexlansoprazole 30 MG capsule Take 1 capsule (30 mg  total) by mouth daily.  30 capsule  6  . levocetirizine (XYZAL) 5 MG tablet 1 tab every am  30 tablet  11  . nadolol (CORGARD) 40 MG tablet TAKE ONE TABLET EACH DAY  30 tablet  7  . TRIBENZOR 40-5-12.5 MG TABS TAKE ONE TABLET BY MOUTH ONCE DAILY  30 tablet  11   No current facility-administered medications for this visit.    Allergies-reviewed and updated Allergies  Allergen Reactions  . Celexa [Citalopram Hydrobromide]     psychosis  . Cephalosporins     REACTION: tongue swelling  . Clarithromycin     REACTION: blisters in mouth  . Clarithromycin   . Doxycycline Hyclate     REACTION: nausea, vomiting  . Levofloxacin     REACTION: tongue swells  .  Penicillins     REACTION: per patient causes rash,hives  . Zolpidem Tartrate     REACTION: difficulty with concentration    History   Social History  . Marital Status: Divorced    Spouse Name: N/A    Number of Children: N/A  . Years of Education: N/A   Occupational History  . Cleaning    Social History Main Topics  . Smoking status: Former Research scientist (life sciences)  . Smokeless tobacco: Never Used  . Alcohol Use: No  . Drug Use: No  . Sexual Activity: Yes   Other Topics Concern  . 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     ROS--See HPI   Objective: BP 140/60  Pulse 64  Temp(Src) 99 F (37.2 C)  Wt 177 lb (80.287 kg) Gen: NAD, resting comfortably in chair, moves easily to table HEENT: Mucous membranes are moist. Oropharynx normal, good dentition Neck: no thyromegaly CV: RRR no murmurs rubs or gallops Lungs: CTAB no crackles, wheeze, rhonchi Abdomen: soft/nontender/nondistended/normal bowel sounds.  Ext: trace edema Skin: warm, dry, slight erythema bilateral lower legs on medial side only MSK: limited flexion of arm on right as compared to left Neuro: grossly normal, moves all extremities, PERRLA  Assessment/Plan:  Limited joint range of motion Patient cannot fully flex her right arm and seems to be worsening. Referral to Dr. Charlann Boxer for his expertise in sports medicine.  Patient with multiple other MSK concerns but I asked her to focus her visit with Dr. 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   Rash Recurrent on the medial portion of the bilateral lower legs. No red flags. Discussed with patient red flag for workup. Fortunately fiery red rash resolves typically within a week or so without treatment  HYPERTENSION Stable on nadolol and Tribenzor. Patient states Tribenzor costs her over $100 a  month. She has 2 months worth currently. Discussed trying alternative generic medicine and she is agreeable. She will see me at least 2 weeks before running out in plan will be olmesartan 40 mg and hydrochlorothiazide 25 mg to start with followup in 2 weeks to determine if needs to add amlodipine.   Depression Discussed with patient that coming off Wellbutrin would be a possibility. I asked her to return to discuss full psychiatric history as it was difficult to determine true course of her disease today.  Insomnia I refilled Xanax and asked patient to try to take a half instead of a full tab.  Patient also wants to discuss cheaper PPI option as in donut hole.   Orders Placed This Encounter  Procedures  . Ambulatory referral to Sports Medicine    Referral Priority:  Routine    Referral Type:  Consultation    Referred to Provider:  [name], DO    Number of Visits Requested:  1   Meds ordered this encounter  Medications  . alprazolam (XANAX) 2 MG tablet    Sig: TAKE 1/2 to ONE TABLET AT BEDTIME AS NEEDED FORSLEEP    Dispense:  30 tablet    Refill:  5  . calcitRIOL (ROCALTROL) 0.25 MCG capsule    Sig: TAKE ONE CAPSULE THREE TIMES PER WEEK    Dispense:  15 capsule    Refill:  5

## 2014-07-13 NOTE — Assessment & Plan Note (Signed)
Stable on nadolol and Tribenzor. Patient states Tribenzor costs her over $100 a month. She has 2 months worth currently. Discussed trying alternative generic medicine and she is agreeable. She will see me at least 2 weeks before running out in plan will be olmesartan 40 mg and hydrochlorothiazide 25 mg to start with followup in 2 weeks to determine if needs to add amlodipine.

## 2014-07-13 NOTE — Assessment & Plan Note (Signed)
Patient cannot fully flex her right arm and seems to be worsening. Referral to Dr. Charlann Boxer for his expertise in sports medicine

## 2014-07-13 NOTE — Assessment & Plan Note (Signed)
Recurrent on the medial portion of the bilateral lower legs. No red flags. Discussed with patient red flag for workup. Fortunately fiery red rash resolves typically within a week or so without treatment

## 2014-07-23 ENCOUNTER — Other Ambulatory Visit: Payer: Self-pay

## 2014-07-23 DIAGNOSIS — Z1231 Encounter for screening mammogram for malignant neoplasm of breast: Secondary | ICD-10-CM

## 2014-07-30 ENCOUNTER — Ambulatory Visit (INDEPENDENT_AMBULATORY_CARE_PROVIDER_SITE_OTHER): Payer: Medicare Other

## 2014-07-30 ENCOUNTER — Other Ambulatory Visit (INDEPENDENT_AMBULATORY_CARE_PROVIDER_SITE_OTHER): Payer: Medicare Other

## 2014-07-30 ENCOUNTER — Ambulatory Visit (INDEPENDENT_AMBULATORY_CARE_PROVIDER_SITE_OTHER): Payer: Medicare Other | Admitting: Family Medicine

## 2014-07-30 ENCOUNTER — Encounter: Payer: Self-pay | Admitting: Family Medicine

## 2014-07-30 ENCOUNTER — Ambulatory Visit (INDEPENDENT_AMBULATORY_CARE_PROVIDER_SITE_OTHER)
Admission: RE | Admit: 2014-07-30 | Discharge: 2014-07-30 | Disposition: A | Discharge status: Home / Self Care | Payer: Medicare Other | Source: Ambulatory Visit | Attending: Family Medicine | Admitting: Family Medicine

## 2014-07-30 ENCOUNTER — Ambulatory Visit
Admission: RE | Admit: 2014-07-30 | Discharge: 2014-07-30 | Disposition: A | Discharge status: Home / Self Care | Payer: Medicare Other | Source: Ambulatory Visit

## 2014-07-30 VITALS — BP 122/82 | HR 71 | Ht 62.0 in | Wt 176.0 lb

## 2014-07-30 DIAGNOSIS — G5701 Lesion of sciatic nerve, right lower limb: Secondary | ICD-10-CM

## 2014-07-30 DIAGNOSIS — Z1231 Encounter for screening mammogram for malignant neoplasm of breast: Secondary | ICD-10-CM

## 2014-07-30 DIAGNOSIS — M79601 Pain in right arm: Secondary | ICD-10-CM

## 2014-07-30 DIAGNOSIS — M67911 Unspecified disorder of synovium and tendon, right shoulder: Secondary | ICD-10-CM

## 2014-07-30 DIAGNOSIS — N2581 Secondary hyperparathyroidism of renal origin: Secondary | ICD-10-CM

## 2014-07-30 LAB — TSH: TSH: 1.82 u[IU]/mL (ref 0.35–4.50)

## 2014-07-30 LAB — VITAMIN D 25 HYDROXY (VIT D DEFICIENCY, FRACTURES): VITD: 24.57 ng/mL — ABNORMAL LOW (ref 30.00–100.00)

## 2014-07-30 NOTE — Patient Instructions (Signed)
Good to see you Ice 20 minutes 2 times daily. Usually after activity and before bed. Exercises 3 times a week. Alternate the arm and the  Labs today.  Vitamin D 1000 IU daily Turmeric 500mg  twice daily Tylenol 325 mg three times daily.  When sitting put tennis ball in back right pocket.  Come back in 3 weeks ot make sure you are doing well.

## 2014-07-30 NOTE — Assessment & Plan Note (Signed)
Piriformis Syndrome  Using an anatomical model, reviewed with the patient the structures involved and how they related to diagnosis. The patient indicated understanding.   The patient was given a handout from Dr. Rouzier's book "The Sports Medicine Patient Advisor" describing the anatomy and rehabilitation of the following condition: Piriformis Syndrome  Also given a handout with more extensive Piriformis stretching, hip flexor and abductor strengthening, ham stretching  Rec deep massage, explained self-massage with ball RTC in 3 weeks.  

## 2014-07-30 NOTE — Assessment & Plan Note (Signed)
The patient was given an injection today. Patient did have mild increase in range of motion was immediately. I do think the patient is likely still having some hypercalcemia. I would like to get labs today to picture the patient is doing well with her medications. We may need to consider further followup for the hyperparathyroidism. Patient given home exercise program icing program we discussed over-the-counter medications he can be beneficial. Patient will come back in 3 weeks for further evaluation and treatment.

## 2014-07-30 NOTE — Progress Notes (Signed)
[name] [place], [place] [address] Phone: [phone] Subjective:    I'm seeing this patient by the request  of:  Garret Reddish, MD   CC: Right arm pain  [id number] Felicia Perry is a 68 y.o. female coming in with complaint of right arm pain. Patient does have a past medical history positive for secondary hyperparathyroidism. Patient has seen endocrinology previously. Patient states that she has pain because her right arm she cannot fully flex. States that it seems to be worsening over the course of time. Patient states the severity is 7/10. Denies any nighttime awakening. Patient has had to do a lot more activity with her left arm over the course of time. Patient does not remember any true injury. Patient states that over-the-counter medicines only helped minimally. Describes it as a dull aching sensation. No true sharp pain in denies any weakness.  The patient is also complaining of low back pain. Patient has had a past medical history significant for low back pain and has had epidural steroid injections in her back previously. Patient states that now she has most of her pain in the right buttocks area and seems to radiate down the posterior lateral aspect of the leg. Usually stopping around the knee. Patient denies any weakness or numbness. Patient is a severity of 6/10. Patient is still able to do all activities of daily living and continues to work as a Engineer, building services on a regular basis.     Past medical history, social, surgical and family history all reviewed in electronic medical record.   Review of Systems: No headache, visual changes, nausea, vomiting, diarrhea, constipation, dizziness, abdominal pain, skin rash, fevers, chills, night sweats, weight loss, swollen lymph nodes, body aches, joint swelling, muscle aches, chest pain, shortness of breath, mood changes.   Objective Blood pressure 122/82, pulse 71, height 5\' 2"  (1.575 m),  weight 176 lb (79.833 kg), SpO2 98.00%.  General: No apparent distress alert and oriented x3 mood and affect normal, dressed appropriately.  HEENT: Pupils equal, extraocular movements intact  Respiratory: Patient's speak in full sentences and does not appear short of breath  Cardiovascular: No lower extremity edema, non tender, no erythema  Skin: Warm dry intact with no signs of infection or rash on extremities or on axial skeleton.  Abdomen: Soft nontender  Neuro: Cranial nerves II through XII are intact, neurovascularly intact in all extremities with 2+ DTRs and 2+ pulses.  Lymph: No lymphadenopathy of posterior or anterior cervical chain or axillae bilaterally.  Gait normal with good balance and coordination.  MSK:  Non tender with full range of motion and good stability and symmetric strength and tone of elbows, wrist, hip, knee and ankles bilaterally.  Back exam shows the patient has fairly good range of motion lacking the last 5 of extension as well as side bending bilaterally. Patient though does have a negative straight leg test. Patient does have a positive Faber test and is tender over the right piriformis muscle. Full strength of all muscles of the lower extremity that is symmetric to the contralateral side and neurovascularly intact distally.  Shoulder: Right Inspection reveals no abnormalities, atrophy or asymmetry. Patient does have generalized tenderness to palpation ROM shows mild 5 lax in for flexion as well as decreased internal rotation to 10. Rotator cuff strength normal throughout. signs of impingement with positive Neer and Hawkin's tests, but negative empty can sign. Speeds and Yergason's tests normal. No labral pathology noted with negative  Obrien's, negative clunk and good stability. Normal scapular function observed. No painful arc and no drop arm sign. No apprehension sign Contralateral shoulder unremarkable  MSK US performed of: Right This study was ordered,  performed, and interpreted by Charlann Boxer D.O.  Shoulder:   Supraspinatus:  Appears normal on long and transverse views, Bursal bulge seen with shoulder abduction on impingement view. Infraspinatus:  Appears normal on long and transverse views. Significant increase in Doppler flow Subscapularis:  Appears normal on long and transverse views. Positive bursa Teres Minor:  Appears normal on long and transverse views. AC joint:  Capsule undistended, no geyser sign. Glenohumeral Joint:  Appears normal without effusion. Glenoid Labrum:  Intact without visualized tears. Biceps Tendon:  Appears normal on long and transverse views, no fraying of tendon, tendon located in intertubercular groove, no subluxation with shoulder internal or external rotation.  Impression: Subacromial bursitis with calcific changes of the rotator cuff but no true tear appreciated.  Procedure: Real-time Ultrasound Guided Injection of right glenohumeral joint Device: GE Logiq E  Ultrasound guided injection is preferred based studies that show increased duration, increased effect, greater accuracy, decreased procedural pain, increased response rate with ultrasound guided versus blind injection.  Verbal informed consent obtained.  Time-out conducted.  Noted no overlying erythema, induration, or other signs of local infection.  Skin prepped in a sterile fashion.  Local anesthesia: Topical Ethyl chloride.  With sterile technique and under real time ultrasound guidance:  Joint visualized.  23g 1  inch needle inserted posterior approach. Pictures taken for needle placement. Patient did have injection of 2 cc of 1% lidocaine, 2 cc of 0.5% Marcaine, and 1.0 cc of Kenalog 40 mg/dL. Completed without difficulty  Pain immediately resolved suggesting accurate placement of the medication.  Advised to call if fevers/chills, erythema, induration, drainage, or persistent bleeding.  Images permanently stored and available for review in the  ultrasound unit.  Impression: Technically successful ultrasound guided injection.     Impression and Recommendations:     This case required medical decision making of moderate complexity.

## 2014-07-30 NOTE — Assessment & Plan Note (Signed)
I do think hypercalcemia to be causing the patient's discomfort. We discussed getting less today to see if that this is possibly giving her some difficulty. Patient need to followup with endocrinologist the

## 2014-07-31 LAB — PTH, INTACT AND CALCIUM
Calcium: 9.6 mg/dL (ref 8.4–10.5)
PTH: 30 pg/mL (ref 14–64)

## 2014-08-03 ENCOUNTER — Encounter: Payer: Self-pay | Admitting: Family Medicine

## 2014-08-03 ENCOUNTER — Ambulatory Visit (INDEPENDENT_AMBULATORY_CARE_PROVIDER_SITE_OTHER): Payer: Medicare Other | Admitting: Family Medicine

## 2014-08-03 VITALS — BP 130/80 | HR 72 | Temp 97.8°F | Wt 175.0 lb

## 2014-08-03 DIAGNOSIS — F32A Depression, unspecified: Secondary | ICD-10-CM

## 2014-08-03 DIAGNOSIS — G4700 Insomnia, unspecified: Secondary | ICD-10-CM

## 2014-08-03 DIAGNOSIS — F329 Major depressive disorder, single episode, unspecified: Secondary | ICD-10-CM

## 2014-08-03 MED ORDER — BUPROPION HCL 75 MG PO TABS: 37.5000 mg | ORAL_TABLET | Freq: Two times a day (BID) | ORAL | Status: DC

## 2014-08-03 NOTE — Assessment & Plan Note (Signed)
Patient would really like to minimize medications. She has not trial off Wellbutrin since at least 2010. I believe she had some situational depression at that time. We will try Wellbutrin 75 mg split in half twice a day and follow up in one month. PHq9 essentially 4 today and will use this for monitoring. If elevation would consider permanently continuing Wellbutrin.

## 2014-08-03 NOTE — Patient Instructions (Addendum)
Depression-symptoms currently controlled on medicine but possible would be controlled off of medicine. You will take 1/2 a 75 mg pill twice a day for the next month.   Take full tab of xanax for now to help you sleep.   See me in 1 month to follow up on depression and to discuss changes in blood pressure medicine. Try to find out costs on new plan.

## 2014-08-03 NOTE — Progress Notes (Signed)
  Felicia Reddish, MD Phone: [phone]  Subjective:  Chief complaint-noted  Depression-well-controlled  Father died in 02-22-2007 and mother in 02/21/09. Lost friend around this time and son had horrific car accident. Was working full time, caring for mother and father before they passed. Had to eat into retirement. Tends to have always been so sensitive. Was placed on celexa somewhere between 2008-2010. Did not do well on celexa-a lot of confusion. Was taken off of bupropion. Has not trialed off of Wellbutrin since 02/21/2009. Has never done therapy. Had been on depressive medicine previously.   PHQ9 score of 4, would be a 7 but the decreased sleep was due to decreased benzo. Scored 1 for feeling down but this is related finanaces and retirement hopes. Overeating is 3.   ROS- No SI HI  Insomnia-poor control Waking up in middle of night on 1mg  of xanax ROS- no vivid dreams  Patient Active Problem List   Diagnosis Date Noted  . Insomnia 07/13/2014    Priority: Medium  . Secondary hyperparathyroidism 08/19/2012    Priority: Medium  . Depression 04/22/2007    Priority: Medium  . HYPERTENSION 04/22/2007    Priority: Medium  . ASTHMA 04/22/2007    Priority: Medium  . Limited joint range of motion 07/13/2014    Priority: Low  . Rash 07/13/2014    Priority: Low  . GERD (gastroesophageal reflux disease) 02/25/2011    Priority: Low  . CERVICAL DISC DISORDER 10/10/2007    Priority: Low  . LOW BACK PAIN 07/01/2007    Priority: Low  . Tendinopathy of right rotator cuff 07/30/2014  . Piriformis syndrome of right side 07/30/2014   Medications- reviewed and updated Current Outpatient Prescriptions  Medication Sig Dispense Refill  . calcitRIOL (ROCALTROL) 0.25 MCG capsule TAKE ONE CAPSULE THREE TIMES PER WEEK  15 capsule  5  . Dexlansoprazole 30 MG capsule Take 1 capsule (30 mg total) by mouth daily.  30 capsule  6  . levocetirizine (XYZAL) 5 MG tablet 1 tab every am  30 tablet  11  . nadolol  (CORGARD) 40 MG tablet TAKE ONE TABLET EACH DAY  30 tablet  7  . TRIBENZOR 40-5-12.5 MG TABS TAKE ONE TABLET BY MOUTH ONCE DAILY  30 tablet  11  . alprazolam (XANAX) 2 MG tablet TAKE 1/2 to ONE TABLET AT BEDTIME AS NEEDED FORSLEEP  30 tablet  5  . buPROPion (WELLBUTRIN) 75 MG tablet Take 0.5 tablets (37.5 mg total) by mouth 2 (two) times daily.  30 tablet  0   No current facility-administered medications for this visit.    Objective: BP 130/80  Pulse 72  Temp(Src) 97.8 F (36.6 C)  Wt 175 lb (79.379 kg) Gen: NAD, resting comfortably Psych: slightly anxious appearing   Assessment/Plan:  Depression Patient would really like to minimize medications. She has not trial off Wellbutrin since at least 02-21-09. I believe she had some situational depression at that time. We will try Wellbutrin 75 mg split in half twice a day and follow up in one month. PHq9 essentially 4 today and will use this for monitoring. If elevation would consider permanently continuing Wellbutrin.  Insomnia Patient will resume 2 mg at bedtime as I believe this and her sleep is important while trying to wean Wellbutrin. Hopeful we will be able to cut down once she is off Wellbutrin

## 2014-08-03 NOTE — Assessment & Plan Note (Signed)
Patient will resume 2 mg at bedtime as I believe this and her sleep is important while trying to wean Wellbutrin. Hopeful we will be able to cut down once she is off Wellbutrin

## 2014-08-04 LAB — PTH-RELATED PEPTIDE: PTH-Related Protein (PTH-RP): 17 pg/mL (ref 14–27)

## 2014-08-10 ENCOUNTER — Other Ambulatory Visit: Payer: Self-pay

## 2014-08-21 ENCOUNTER — Encounter: Payer: Self-pay | Admitting: Family Medicine

## 2014-08-21 ENCOUNTER — Ambulatory Visit (INDEPENDENT_AMBULATORY_CARE_PROVIDER_SITE_OTHER): Payer: Medicare Other | Admitting: Family Medicine

## 2014-08-21 VITALS — BP 130/80 | HR 67 | Ht 62.0 in | Wt 180.0 lb

## 2014-08-21 DIAGNOSIS — M9908 Segmental and somatic dysfunction of rib cage: Secondary | ICD-10-CM

## 2014-08-21 DIAGNOSIS — M999 Biomechanical lesion, unspecified: Secondary | ICD-10-CM | POA: Insufficient documentation

## 2014-08-21 DIAGNOSIS — M67911 Unspecified disorder of synovium and tendon, right shoulder: Secondary | ICD-10-CM

## 2014-08-21 DIAGNOSIS — G5701 Lesion of sciatic nerve, right lower limb: Secondary | ICD-10-CM

## 2014-08-21 NOTE — Assessment & Plan Note (Signed)
Decision today to treat with OMT was based on Physical Exam  After verbal consent patient was treated with HVLA, ME techniques in rib areas  Patient tolerated the procedure well with improvement in symptoms  Patient given exercises, stretches and lifestyle modifications  See medications in patient instructions if given  Patient will follow up in 2-3 weeks

## 2014-08-21 NOTE — Assessment & Plan Note (Signed)
Patient is doing remarkably better after injection. We discussed continuing home exercises as well as with vitamin D supplementation to help with the calcific deposition. We discussed continuing the icing and home exercises 3 times a week for another 6 weeks. Patient and will follow-up with me again in 3-6 weeks for further evaluation. Patient should be nearly pain-free at that time.

## 2014-08-21 NOTE — Assessment & Plan Note (Signed)
Encourage patient to continue the exercises at this time as well as the manual massage. No changes were made. Patient is improving.

## 2014-08-21 NOTE — Patient Instructions (Signed)
You are doing everything right.  Ice is your friend Work on sleeping position.  New exercise.  Heels, butt shoulder and head touching wall for goal for 5 minutes.  Continue the exercise 3 times a week for 6 weeks.  Tylenol 325mg  three times daily Continue the vitamin D 2000IU daily, turmeric 500mg  twice daily.  See me again in 3 weeks.

## 2014-08-21 NOTE — Progress Notes (Signed)
[name] [place], [place] [address] Phone: [phone] Subjective:     CC: Right arm pain as well as piriformis follow-up  [id number] Venessa H Perry is a 68 y.o. female coming in with complaint of right arm pain. Patient does have a past medical history positive for secondary hyperparathyroidism. Patient was found to have calcific rotator cuff tendinitis. Patient was given a critical steroid injection given home exercise program and discussion of over-the-counter medications. Patient states that she is feeling significantly better. Patient states that she is 60-70% better. 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 Patient has noticed that she is also increased her strength on this side which has been very beneficial. A Shon Baton states that she is having no side effects to the medications and overall is feeling very well.   The patient is also complaining of low back pain. Patient has had a past medical history significant for low back pain and has had epidural steroid injections in her back previously. Patient was seen and did have more of a piriformis syndrome of the right side. Patient has been doing the manual massage as well as the home exercises and the over-the-counter medications and has noticed approximately 80% better. Patient has been able to start walking on a regular basis approximately 6-7 blocks daily without any significant pain. Patient denies any radiation down her leg or any numbness or tingling and states that she is very happy with the results of far.    Past medical history, social, surgical and family history all reviewed in electronic medical record.   Review of Systems: No headache, visual changes, nausea, vomiting, diarrhea, constipation, dizziness, abdominal pain, skin rash, fevers, chills, night sweats, weight loss, swollen lymph nodes, body aches, joint swelling, muscle aches,  chest pain, shortness of breath, mood changes.   Objective Blood pressure 130/80, pulse 67, height 5\' 2"  (1.575 m), weight 180 lb (81.647 kg), SpO2 97.00%.  General: No apparent distress alert and oriented x3 mood and affect normal, dressed appropriately.  HEENT: Pupils equal, extraocular movements intact  Respiratory: Patient's speak in full sentences and does not appear short of breath  Cardiovascular: No lower extremity edema, non tender, no erythema  Skin: Warm dry intact with no signs of infection or rash on extremities or on axial skeleton.  Abdomen: Soft nontender  Neuro: Cranial nerves II through XII are intact, neurovascularly intact in all extremities with 2+ DTRs and 2+ pulses.  Lymph: No lymphadenopathy of posterior or anterior cervical chain or axillae bilaterally.  Gait normal with good balance and coordination.  MSK:  Non tender with full range of motion and good stability and symmetric strength and tone of elbows, wrist, hip, knee and ankles bilaterally.  Back exam shows the patient has fairly good range of motion lacking the last 5 of extension as well as side bending bilaterally. Patient though does have a negative straight leg test. Patient does have a positive Faber test . Negative for tenderness today. Full strength of all muscles of the lower extremity that is symmetric to the contralateral side and neurovascularly intact distally.  Shoulder: Right Inspection reveals no abnormalities, atrophy or asymmetry. Patient does have generalized tenderness to palpation ROM shows full range of motion today passively . Rotator cuff strength normal throughout. signs of impingement with positive Neer and Hawkin's tests, but negative empty can sign. Speeds and Yergason's tests normal. No labral pathology noted with  negative Obrien's, negative clunk and good stability. Normal scapular function observed. No painful arc and no drop arm sign. No apprehension sign Contralateral  shoulder unremarkable  Osteopathic findings of inhaled second rib     Impression and Recommendations:     This case required medical decision making of moderate complexity.

## 2014-09-07 ENCOUNTER — Ambulatory Visit (INDEPENDENT_AMBULATORY_CARE_PROVIDER_SITE_OTHER): Payer: Medicare Other | Admitting: Family Medicine

## 2014-09-07 ENCOUNTER — Encounter: Payer: Self-pay | Admitting: Family Medicine

## 2014-09-07 VITALS — BP 120/80 | Temp 97.9°F | Wt 176.0 lb

## 2014-09-07 DIAGNOSIS — F329 Major depressive disorder, single episode, unspecified: Secondary | ICD-10-CM

## 2014-09-07 DIAGNOSIS — F32A Depression, unspecified: Secondary | ICD-10-CM

## 2014-09-07 DIAGNOSIS — N2581 Secondary hyperparathyroidism of renal origin: Secondary | ICD-10-CM

## 2014-09-07 NOTE — Progress Notes (Signed)
Garret Reddish, MD Phone: [phone]  Subjective:   Felicia Perry is a 68 y.o. year old very pleasant female patient who presents with the following:  Depression Doing well. No noticeable issues. States may even feel better off medicine. PHQ9 of 4 and 3 points for overeating. Patient does not eat all day and then eats and eats at night. Taking 1/2 a 75mg  pill twice a day.  ROS- No SI HI  Past Medical History- Patient Active Problem List   Diagnosis Date Noted  . Insomnia 07/13/2014    Priority: Medium  . Secondary hyperparathyroidism 08/19/2012    Priority: Medium  . Depression 04/22/2007    Priority: Medium  . HYPERTENSION 04/22/2007    Priority: Medium  . ASTHMA 04/22/2007    Priority: Medium  . Limited joint range of motion 07/13/2014    Priority: Low  . Rash 07/13/2014    Priority: Low  . GERD (gastroesophageal reflux disease) 02/25/2011    Priority: Low  . CERVICAL DISC DISORDER 10/10/2007    Priority: Low  . LOW BACK PAIN 07/01/2007    Priority: Low  . Nonallopathic lesion-rib cage 08/21/2014  . Tendinopathy of right rotator cuff 07/30/2014  . Piriformis syndrome of right side 07/30/2014   Medications- reviewed and updated Current Outpatient Prescriptions  Medication Sig Dispense Refill  . acetaminophen (TYLENOL) 325 MG tablet Take 650 mg by mouth every 6 (six) hours as needed.    Marland Kitchen buPROPion (WELLBUTRIN) 75 MG tablet Take 0.5 tablets (37.5 mg total) by mouth 2 (two) times daily. 30 tablet 0  . calcitRIOL (ROCALTROL) 0.25 MCG capsule TAKE ONE CAPSULE THREE TIMES PER WEEK 15 capsule 5  . cholecalciferol (VITAMIN D) 1000 UNITS tablet Take 1,000 Units by mouth daily.    Marland Kitchen Dexlansoprazole 30 MG capsule Take 1 capsule (30 mg total) by mouth daily. 30 capsule 6  . levocetirizine (XYZAL) 5 MG tablet 1 tab every am 30 tablet 11  . nadolol (CORGARD) 40 MG tablet TAKE ONE TABLET EACH DAY 30 tablet 7  . TRIBENZOR 40-5-12.5 MG TABS TAKE ONE TABLET BY MOUTH ONCE DAILY  30 tablet 11  . alprazolam (XANAX) 2 MG tablet TAKE 1/2 to ONE TABLET AT BEDTIME AS NEEDED FORSLEEP 30 tablet 5   No current facility-administered medications for this visit.    Objective: BP 120/80 mmHg  Temp(Src) 97.9 F (36.6 C)  Wt 176 lb (79.833 kg) Gen: NAD, resting comfortably Psych: energetic, non depressed mood   Assessment/Plan:  Depression PHQ9 of 4 with overeating still scoring 3 for patient. Wellbutrin now at 1/2 tab BID of 75mg . Titrate down to 1/2 tab in AM and follow up in 2 months, if phq9 remains <5, plan to d/c. For overeating portion, patient busy all day then comes home and eats large meal. Discussed regular meals throughout the day to help with reducing nighttime cravings.    Return precautions advised specifically for worsening depression.   >50% of 20 minute office visit was spent on counseling (signs/symptoms of depression, need to follow up for hyperparathyroidism with endocrine, patterns of eating that are healthier)  and coordination of care  Has not seen Dr. Loanne Drilling in several years and requesting me to take over calcitriol dosing-discussed I wanted at least one more visit with Dr. Loanne Drilling before even considering a move like that.  Orders Placed This Encounter  Procedures  . Ambulatory referral to Endocrinology    Referral Priority:  Routine    Referral Type:  Consultation    Referral  Reason:  Specialty Services Required    Number of Visits Requested:  1

## 2014-09-07 NOTE — Patient Instructions (Addendum)
Take 1/2 a 75 mg in the morning of the wellbutrin. Plan at follow up will be to repeat questionnaire and consider coming off if stable.   Follow up in 2 months. Then schedule for my next available physical with labs 1 week before (typical labs for 68 year old female)  Referred you back to endocrine. I want their thoughts before permanently taking over calcitriol or they may not even want that.

## 2014-09-07 NOTE — Assessment & Plan Note (Signed)
PHQ9 of 4 with overeating still scoring 3 for patient. Wellbutrin now at 1/2 tab BID of 75mg . Titrate down to 1/2 tab in AM and follow up in 2 months, if phq9 remains <5, plan to d/c. For overeating portion, patient busy all day then comes home and eats large meal. Discussed regular meals throughout the day to help with reducing nighttime cravings.

## 2014-09-11 ENCOUNTER — Telehealth: Payer: Self-pay | Admitting: Family Medicine

## 2014-09-11 NOTE — Telephone Encounter (Signed)
Pt needs medical exception form and pt agent will fax form. Pt medication xanax,dexlansoprazole,nadolol and tribenzor are not cover on her ins plan

## 2014-09-11 NOTE — Telephone Encounter (Addendum)
[employer] exception form phone # [phone]

## 2014-09-14 ENCOUNTER — Ambulatory Visit (INDEPENDENT_AMBULATORY_CARE_PROVIDER_SITE_OTHER): Payer: Medicare Other | Admitting: Family Medicine

## 2014-09-14 ENCOUNTER — Encounter: Payer: Self-pay | Admitting: Family Medicine

## 2014-09-14 VITALS — BP 142/80 | HR 67 | Ht 62.0 in | Wt 180.0 lb

## 2014-09-14 DIAGNOSIS — M67911 Unspecified disorder of synovium and tendon, right shoulder: Secondary | ICD-10-CM

## 2014-09-14 DIAGNOSIS — M9908 Segmental and somatic dysfunction of rib cage: Secondary | ICD-10-CM

## 2014-09-14 DIAGNOSIS — M9902 Segmental and somatic dysfunction of thoracic region: Secondary | ICD-10-CM

## 2014-09-14 DIAGNOSIS — M999 Biomechanical lesion, unspecified: Secondary | ICD-10-CM | POA: Insufficient documentation

## 2014-09-14 NOTE — Patient Instructions (Addendum)
Good to see you and you are doing great!!!! Ice is still your friend Can use heat before activity if you want.  Continue to work on sleeping positon Conitnue the exercises daily See me again in 5 weeks.

## 2014-09-14 NOTE — Progress Notes (Signed)
  [name] [place], [place] [address] Phone: [phone] Subjective:     CC: Right arm pain as well as piriformis follow-up  [id number] Felicia Perry is a 68 y.o. female coming in with complaint of right arm pain. Patient does have a past medical history positive for secondary hyperparathyroidism. Patient was found to have calcific rotator cuff tendinitis. Patient was given a steroid injection given home exercise program and discussion of over-the-counter medications. Patient states that she is feeling significantly better. Patient was also found to have an elevated second rib and did have osteopathic manipulation done at last follow-up. Patient states that this has helped out the pain significantly. Patient continues to be able to clean on a regular basis. Patient is not having any significant pain to stop her from any of her activities. States that she is resting more comfortably at night. Is doing the vitamin supplementation we discussed previously.     Past medical history, social, surgical and family history all reviewed in electronic medical record.   Review of Systems: No headache, visual changes, nausea, vomiting, diarrhea, constipation, dizziness, abdominal pain, skin rash, fevers, chills, night sweats, weight loss, swollen lymph nodes, body aches, joint swelling, muscle aches, chest pain, shortness of breath, mood changes.   Objective Blood pressure 142/80, pulse 67, height 5\' 2"  (1.575 m), weight 180 lb (81.647 kg), SpO2 98 %.  General: No apparent distress alert and oriented x3 mood and affect normal, dressed appropriately.  HEENT: Pupils equal, extraocular movements intact  Respiratory: Patient's speak in full sentences and does not appear short of breath  Cardiovascular: No lower extremity edema, non tender, no erythema  Skin: Warm dry intact with no signs of infection or rash on extremities or on axial skeleton.  Abdomen: Soft  nontender  Neuro: Cranial nerves II through XII are intact, neurovascularly intact in all extremities with 2+ DTRs and 2+ pulses.  Lymph: No lymphadenopathy of posterior or anterior cervical chain or axillae bilaterally.  Gait normal with good balance and coordination.  MSK:  Non tender with full range of motion and good stability and symmetric strength and tone of elbows, wrist, hip, knee and ankles bilaterally.    Shoulder: Right Inspection reveals no abnormalities, atrophy or asymmetry. Patient does have generalized tenderness to palpation ROM shows full range of motion today passively . Rotator cuff strength normal throughout. Minimal signs of impingement still remaining Speeds and Yergason's tests normal. No labral pathology noted with negative Obrien's, negative clunk and good stability. Normal scapular function observed. No painful arc and no drop arm sign. No apprehension sign Contralateral shoulder unremarkable  Osteopathic findings of inhaled second rib Thoracic T2 extended rotated and side bent right T5 extended rotated and side bent left     Impression and Recommendations:     This case required medical decision making of moderate complexity.

## 2014-09-14 NOTE — Assessment & Plan Note (Signed)
Patient is improving at this time. Patient was given a critical steroid injection and I do think with patient likely having possible intervention on her hyperparathyroidism she will respond accordingly. Encourage her to continue with the vitamin D supplementation and potentially increasing it to 2000 daily with her low vitamin D level. We discussed icing protocol at this time. Encourage her to continue the exercises fairly regularly. Patient will also continue to work on sleeping position.

## 2014-09-14 NOTE — Assessment & Plan Note (Signed)
Decision today to treat with OMT was based on Physical Exam  After verbal consent patient was treated with HVLA, ME techniques in thoracic and rib areas  Patient tolerated the procedure well with improvement in symptoms  Patient given exercises, stretches and lifestyle modifications  See medications in patient instructions if given  Patient will follow up in 2-3 weeks      

## 2014-09-15 ENCOUNTER — Telehealth: Payer: Self-pay

## 2014-09-15 ENCOUNTER — Other Ambulatory Visit: Payer: Self-pay

## 2014-09-15 MED ORDER — AMLODIPINE BESYLATE 5 MG PO TABS: 5.0000 mg | ORAL_TABLET | Freq: Every day | ORAL | Status: DC

## 2014-09-15 MED ORDER — PROPRANOLOL HCL 40 MG PO TABS: 40.0000 mg | ORAL_TABLET | Freq: Every day | ORAL | Status: DC

## 2014-09-15 MED ORDER — HYDROCHLOROTHIAZIDE 12.5 MG PO TABS: 12.5000 mg | ORAL_TABLET | Freq: Every day | ORAL | Status: DC

## 2014-09-15 MED ORDER — OLMESARTAN MEDOXOMIL 40 MG PO TABS: 40.0000 mg | ORAL_TABLET | Freq: Every day | ORAL | Status: DC

## 2014-09-15 NOTE — Telephone Encounter (Signed)
Made changes to pt medications and called pt to make her aware of these changes

## 2014-09-17 NOTE — Telephone Encounter (Signed)
Message rte to [place]

## 2014-09-17 NOTE — Telephone Encounter (Signed)
Please refer to 09/15/14 office note from [place].  She has already spoken to the patient about the below request.

## 2014-09-18 ENCOUNTER — Other Ambulatory Visit: Payer: Self-pay | Admitting: *Deleted

## 2014-09-18 ENCOUNTER — Ambulatory Visit (INDEPENDENT_AMBULATORY_CARE_PROVIDER_SITE_OTHER): Payer: Medicare Other | Admitting: Internal Medicine

## 2014-09-18 ENCOUNTER — Encounter: Payer: Self-pay | Admitting: Internal Medicine

## 2014-09-18 VITALS — BP 126/68 | HR 66 | Temp 98.4°F | Resp 12 | Ht 61.75 in | Wt 178.0 lb

## 2014-09-18 DIAGNOSIS — E213 Hyperparathyroidism, unspecified: Secondary | ICD-10-CM

## 2014-09-18 LAB — BASIC METABOLIC PANEL
BUN: 23 mg/dL (ref 6–23)
CO2: 27 meq/L (ref 19–32)
CREATININE: 1 mg/dL (ref 0.4–1.2)
Calcium: 9.4 mg/dL (ref 8.4–10.5)
Chloride: 101 mEq/L (ref 96–112)
GFR: 59.24 mL/min — ABNORMAL LOW (ref >60.00)
Glucose, Bld: 99 mg/dL (ref 70–99)
Potassium: 3.9 mEq/L (ref 3.5–5.1)
SODIUM: 140 meq/L (ref 135–145)

## 2014-09-18 LAB — PHOSPHORUS: PHOSPHORUS: 3.8 mg/dL (ref 2.3–4.6)

## 2014-09-18 LAB — VITAMIN D 25 HYDROXY (VIT D DEFICIENCY, FRACTURES): VITD: 31.66 ng/mL (ref 30.00–100.00)

## 2014-09-18 MED ORDER — CALCITRIOL 0.25 MCG PO CAPS: ORAL_CAPSULE | ORAL | Status: DC

## 2014-09-18 NOTE — Patient Instructions (Addendum)
Please decrease Calcitriol to 0.25 mg 2x a week. Please come back for labs in 1 week. Please stop at the lab. If the vitamin D is normal, please go ahead a collect urine over 24 hours. Patient information (Up-to-Date): Collection of a 24-hour urine specimen   - You should collect every drop of urine during each 24-hour period. It does not matter how much or little urine is passed each time, as long as every drop is collected. - Begin the urine collection in the morning after you wake up, after you have emptied your bladder for the first time. - Urinate (empty the bladder) for the first time and flush it down the toilet. Note the exact time (eg, 6:15 AM). You will begin the urine collection at this time. - Collect every drop of urine during the day and night in an empty collection bottle. Store the bottle at room temperature or in the refrigerator. - If you need to have a bowel movement, any urine passed with the bowel movement should be collected. Try not to include feces with the urine collection. If feces does get mixed in, do not try to remove the feces from the urine collection bottle. - Finish by collecting the first urine passed the next morning, adding it to the collection bottle. This should be within ten minutes before or after the time of the first morning void on the first day (which was flushed). In this example, you would try to void between 6:05 and 6:25 on the second day. - If you need to urinate one hour before the final collection time, drink a full glass of water so that you can void again at the appropriate time. If you have to urinate 20 minutes before, try to hold the urine until the proper time. - Please note the exact time of the final collection, even if it is not the same time as when collection began on day 1. - The bottle(s) may be kept at room temperature for a day or two, but should be kept cool or refrigerated for longer periods of time.

## 2014-09-18 NOTE — Progress Notes (Addendum)
Patient ID: Felicia Perry, female   DOB: 02-May-1946, 68 y.o.   MRN: [id number]   HPI  Felicia Perry is a 68 y.o.-year-old female, referred by her PCP, Dr. Yong Channel, in consultation for normocalcemic hyperparathyroidism. Pt previously seen by Dr Loanne Drilling.  Pt was dx with hyperparathyroidism in 2012. I reviewed pt's pertinent labs: Lab Results  Component Value Date   PTH 30 07/30/2014   PTH 50.0 07/21/2013   PTH 87.2* 08/19/2012   PTH 54.7 02/19/2012   PTH 84.7* 11/13/2011   PTH 72.7* 09/21/2011    Lab Results  Component Value Date   CALCIUM 9.6 07/30/2014   CALCIUM 10.6* 08/25/2013   CALCIUM 9.7 07/21/2013   CALCIUM 9.2 10/07/2012   CALCIUM 9.5 08/19/2012   CALCIUM 9.9 02/19/2012   CALCIUM 9.2 12/14/2011   CALCIUM 9.3 11/13/2011   CALCIUM 9.6 09/21/2011   CALCIUM 9.0 05/12/2011   CALCIUM 10.1 03/04/2009   CALCIUM 9.5 02/28/2007   Of note, she had a NM parathyroid scan on (11/30/2011): Faint focus of residual activity within the right lobe of thyroid gland. This is indeterminate finding but could represent the site of a parathyroid adenoma. Consider follow-up scan or cross sectional imaging of the neck.  Had a hand fracture in 2014 (traumatic).  DEXA scans not available for review. No h/o osteoporosis.  Had early menopause (64-38 y/o), just like her mother.   No h/o kidney stones.  + h/o CKD. Last BUN/Cr: Lab Results  Component Value Date   BUN 24* 08/25/2013   CREATININE 1.28* 08/25/2013   Pt was on HCTZ, stopped 3 years ago.  + h/o vitamin D deficiency. Last level: Component     Latest Ref Rng 07/30/2014  VITD     30.00 - 100.00 ng/mL 24.57 (L)   On Calcitriol (nonformulary) 0.25 mg 3x a week - started in 2013 by Dr Loanne Drilling. She is wondering whether she needs to continue with Calcitriol >> starting 10/2014, it will not be covered by her insurance.  Also takes vit D 2000 units daily (increased from 1000 units x 1.5 mo prior to this). Takes Turmeric 500 mg 2x a  day.   Pt is not on calcium; she does eat dairy and green, leafy, vegetables.  Pt does not have a FH of hypercalcemia, pituitary tumors, thyroid cancer, or osteoporosis.   She has a h/o calcific tendonitis. Seeing sports medicine.   She also has a h/o seasonal asthma, triggered by cold weather >> Prednisone tapers >> weight gain and increased hunger. She got a steroid inj in shoulder 1 mo ago.  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  Denies AP/constipation.  ROS: Constitutional: + weight gain, no fatigue, + subjective hyperthermia, + poor sleep Eyes: + blurry vision, no xerophthalmia ENT: no sore throat, no nodules palpated in throat, no dysphagia/odynophagia, no hoarseness Cardiovascular: no CP/SOB/palpitations/leg swelling Respiratory: + all: cough/SOB/wheezing Gastrointestinal: no N/V/D/C/+ heartburn Musculoskeletal: + all:  muscle/joint aches Skin: no rashes, + easy bruising Neurological: no tremors/numbness/tingling/dizziness Psychiatric: no depression/anxiety + low libido  Past Medical History  Diagnosis Date  . Anxiety   . Asthma   . Hypertension   . Arthritis   . Diverticulosis   . Low back pain   . History of shingles   . PUD (peptic ulcer disease)   . Internal hemorrhoids   . Adenomatous polyp 11/23/2005  . GERD (gastroesophageal reflux disease)   . DIVERTICULOSIS, COLON 04/22/2007  Past Surgical History  Procedure Laterality Date  . 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 Cataract surgery     History   Social History  . Marital Status: Divorced    Spouse Name: N/A    Number of Children: 2   Occupational History  . Cleaning    Social History Main Topics  . Smoking status: Former Smoker, quit 1979  . Smokeless tobacco: Never Used  . Alcohol Use: No  . Drug Use: No  . 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    Current Outpatient Prescriptions on File Prior to Visit  Medication Sig Dispense Refill  . acetaminophen (TYLENOL) 325 MG tablet Take 650 mg by mouth every 6 (six) hours as needed.    Marland Kitchen alprazolam (XANAX) 2 MG tablet TAKE 1/2 to ONE TABLET AT BEDTIME AS NEEDED FORSLEEP 30 tablet 5  . amLODipine (NORVASC) 5 MG tablet Take 1 tablet (5 mg total) by mouth daily. 30 tablet 11  . buPROPion (WELLBUTRIN) 75 MG tablet Take 0.5 tablets (37.5 mg total) by mouth 2 (two) times daily. 30 tablet 0  . cholecalciferol (VITAMIN D) 1000 UNITS tablet Take 1,000 Units by mouth daily.    Marland Kitchen Dexlansoprazole 30 MG capsule Take 1 capsule (30 mg total) by mouth daily. 30 capsule 6  . hydrochlorothiazide (HYDRODIURIL) 12.5 MG tablet Take 1 tablet (12.5 mg total) by mouth daily. 30 tablet 11  . levocetirizine (XYZAL) 5 MG tablet 1 tab every am 30 tablet 11  . olmesartan (BENICAR) 40 MG tablet Take 1 tablet (40 mg total) by mouth daily. 30 tablet 11  . propranolol (INDERAL) 40 MG tablet Take 1 tablet (40 mg total) by mouth daily. 30 tablet 11   No current facility-administered medications on file prior to visit.   Allergies  Allergen Reactions  . Celexa [Citalopram Hydrobromide]     psychosis  . Cephalosporins     REACTION: tongue swelling  . Clarithromycin     REACTION: blisters in mouth  . Clarithromycin   . Doxycycline Hyclate     REACTION: nausea, vomiting  . Levofloxacin     REACTION: tongue swells  . Penicillins     REACTION: per patient causes rash,hives  . Zolpidem Tartrate     REACTION: difficulty with concentration   Family History  Problem Relation Age of Onset  . Heart disease Mother   . Hypertension Mother   . Diabetes Mother   . COPD Father   . Colon cancer Neg Hx    PE: BP 126/68 mmHg  Pulse 66  Temp(Src) 98.4 F (36.9 C) (Oral)  Resp 12  Ht 5' 1.75" (1.568 m)  Wt 178 lb (80.74 kg)  BMI 32.84 kg/m2  SpO2 96% Wt Readings from Last 3 Encounters:   09/18/14 178 lb (80.74 kg)  09/14/14 180 lb (81.647 kg)  09/07/14 176 lb (79.833 kg)   Constitutional: overweight, in NAD. No kyphosis. Eyes: PERRLA, EOMI, no exophthalmos ENT: moist mucous membranes, no thyromegaly, no cervical lymphadenopathy Cardiovascular: RRR, No MRG Respiratory: CTA B Gastrointestinal: abdomen soft, NT, ND, BS+ Musculoskeletal: no deformities, strength intact in all 4 Skin: moist, warm, no rashes Neurological: no tremor with outstretched hands, DTR normal in all 4  Assessment: 1. Normocalcemic hyperparathyroidism  2. Vit D deficiency  Plan: Patient has had 1 instance of minimally elevated calcium per my review of the chart, at 10.6. She had few PTH  Levels that were elevated, however, last was 30, for a calcium of 9.6. She also has a h/o vitamin D deficiency, but I only had 1 value for review.  She was started on Calcitriol in 2013, but I do not have a vitamin D at that time...  No apparent complications from hypercalcemia: no h/o nephrolithiasis, no osteoporosis, had 1 traumatic fracture. No abdominal pain, depression. Has some generalized pain. - I discussed with the patient about the physiology of calcium and parathyroid hormone, and possible side effects from increased PTH, including kidney stones, osteoporosis, abdominal pain, etc.  - We discussed that we need to check whether her hyperparathyroidism is primary (Familial hypercalcemic hypocalciuria or parathyroid adenoma) or secondary (to conditions like: vitamin D deficiency, calcium malabsorption, hypercalciuria, renal insufficiency, etc.). If it is 2/2 vit D deficiency, we can hopefully decrease the calcitriol to off and try to replete the vit D 25-hydroxy only. I advised her to decrease the dose of Calcitriol to 0.25 mg 2x a week, then come for a calcium level >> if normal >> decrease to once a week >> recheck calcium in 1 week >> then stop calcitriol. - if this turns out to be primary HPTH (and her  previous Tc sestamibi scan is borderline suggestive of this) >> we discussed about criteria for parathyroid surgery:  Increased calcium by more than 1 mg/dL above the upper limit of normal  Kidney ds.  Osteoporosis (or Vb fx) Age <25 years old New (2013): High UCa >400 mg/d and increased stone risk by biochemical stone risk analysis Presence of nephrolithiasis or nephrocalcinosis Pt's preference!  - she does not meet the criteria as of now, however I would like to check a DEXA scan to see if she has osteoporosis. I would add a 33% distal radius for evaluation of cortical bone.  - I will also check: calcium level intact PTH ([facility]) Phosphorus vitamin D- 25 HO   24h urinary calcium/creatinine ratio - if vit D normal - pt given instructions for urine collection and the jug - If the tests indicate a parathyroid adenoma, may need referral to surgery. We discussed possible consequences of hyperparathyroidism: ~1/3 pts will develop complications over 15 years (OP, nephrolithiasis).  - I will see the patient back in 4 months  - time spent with the patient: 1 hour, of which >50% was spent in obtaining information about her symptoms, reviewing her previous labs, evaluations, and treatments, counseling her about her condition (please see the discussed topics above), and developing a plan to further investigate it. she had a number of questions which I addressed.   Office Visit on 09/18/2014  Component Date Value Ref Range Status  . PTH 09/18/2014 21  15 - 65 pg/mL Final  . Sodium 09/18/2014 140  135 - 145 mEq/L Final  . Potassium 09/18/2014 3.9  3.5 - 5.1 mEq/L Final  . Chloride 09/18/2014 101  96 - 112 mEq/L Final  . CO2 09/18/2014 27  19 - 32 mEq/L Final  . Glucose, Bld 09/18/2014 99  70 - 99 mg/dL Final  . BUN 09/18/2014 23  6 - 23 mg/dL Final  . Creatinine, Ser 09/18/2014 1.0  0.4 - 1.2 mg/dL Final  . Calcium 09/18/2014 9.4  8.4 - 10.5 mg/dL Final  . GFR 09/18/2014 59.24* >60.00 mL/min  Final  . VITD 09/18/2014 31.66  30.00 - 100.00 ng/mL  Final  . Phosphorus 09/18/2014 3.8  2.3 - 4.6 mg/dL Final   PTH and calcium normal. Will need another PTH and calcium ~ 1-2 mo after stopping calcitriol. Will need to do the 24h urine collection then, if still needed. Vit D low normal, she just increased the vit D dose. I will advise her to increase the dose further to 3000 IU daily since we are tapering down Calcitriol.  Will order the DEXA scan.  09/26/2014: Results:  Lumbar spine (L1-L4) Femoral neck (FN) 33% distal radius  T-score + 0.5 RFN: - 1.5 LFN: - 1.2 Unfortunately, not checked, despite being ordered   Assessment: the BMD is low according to the [facility] classification for osteoporosis (see below). Fracture risk: moderate FRAX score: 10 year major osteoporotic risk: 14.6%. 10 year hip fracture risk: 1.7%. These are under the thresholds for treatment of 20% and 3%, respectively.  Calcium checked 09/27/2014 was: Lab Results  Component Value Date   CALCIUM 9.2 09/25/2014   we decreased the calcitriol to once a week. She will return in a week for repeat calcium levels.

## 2014-09-19 ENCOUNTER — Telehealth: Payer: Self-pay | Admitting: *Deleted

## 2014-09-19 DIAGNOSIS — E213 Hyperparathyroidism, unspecified: Secondary | ICD-10-CM | POA: Insufficient documentation

## 2014-09-19 LAB — PARATHYROID HORMONE, INTACT (NO CA): PTH: 21 pg/mL (ref 15–65)

## 2014-09-19 NOTE — Telephone Encounter (Signed)
Called and scheduled DEXA for pt: Tues, Dec 1st at 1:30 at [facility]. Called pt and advised. Be advised.

## 2014-09-24 ENCOUNTER — Other Ambulatory Visit: Payer: Medicare Other

## 2014-09-25 ENCOUNTER — Other Ambulatory Visit (INDEPENDENT_AMBULATORY_CARE_PROVIDER_SITE_OTHER): Payer: Medicare Other

## 2014-09-25 ENCOUNTER — Ambulatory Visit (INDEPENDENT_AMBULATORY_CARE_PROVIDER_SITE_OTHER)
Admission: RE | Admit: 2014-09-25 | Discharge: 2014-09-25 | Disposition: A | Discharge status: Home / Self Care | Payer: Medicare Other | Source: Ambulatory Visit | Attending: Internal Medicine | Admitting: Internal Medicine

## 2014-09-25 DIAGNOSIS — E213 Hyperparathyroidism, unspecified: Secondary | ICD-10-CM

## 2014-09-26 LAB — CALCIUM: CALCIUM: 9.2 mg/dL (ref 8.4–10.5)

## 2014-09-27 ENCOUNTER — Other Ambulatory Visit: Payer: Self-pay | Admitting: Internal Medicine

## 2014-09-27 DIAGNOSIS — E213 Hyperparathyroidism, unspecified: Secondary | ICD-10-CM

## 2014-09-27 NOTE — Addendum Note (Signed)
Addended by: Philemon Kingdom on: 09/27/2014 05:35 PM   Modules accepted: Level of Service

## 2014-10-16 ENCOUNTER — Other Ambulatory Visit (INDEPENDENT_AMBULATORY_CARE_PROVIDER_SITE_OTHER): Payer: Medicare Other

## 2014-10-16 ENCOUNTER — Ambulatory Visit (INDEPENDENT_AMBULATORY_CARE_PROVIDER_SITE_OTHER): Payer: Medicare Other | Admitting: Family Medicine

## 2014-10-16 ENCOUNTER — Encounter: Payer: Self-pay | Admitting: Family Medicine

## 2014-10-16 VITALS — BP 128/82 | HR 70 | Ht 61.75 in | Wt 181.0 lb

## 2014-10-16 DIAGNOSIS — M25562 Pain in left knee: Secondary | ICD-10-CM

## 2014-10-16 DIAGNOSIS — M5090 Cervical disc disorder, unspecified, unspecified cervical region: Secondary | ICD-10-CM

## 2014-10-16 DIAGNOSIS — M1990 Unspecified osteoarthritis, unspecified site: Secondary | ICD-10-CM

## 2014-10-16 DIAGNOSIS — M9902 Segmental and somatic dysfunction of thoracic region: Secondary | ICD-10-CM

## 2014-10-16 DIAGNOSIS — M999 Biomechanical lesion, unspecified: Secondary | ICD-10-CM

## 2014-10-16 DIAGNOSIS — M1991 Primary osteoarthritis, unspecified site: Secondary | ICD-10-CM

## 2014-10-16 DIAGNOSIS — M23207 Derangement of unspecified meniscus due to old tear or injury, left knee: Secondary | ICD-10-CM

## 2014-10-16 DIAGNOSIS — M9908 Segmental and somatic dysfunction of rib cage: Secondary | ICD-10-CM

## 2014-10-16 DIAGNOSIS — M23204 Derangement of unspecified medial meniscus due to old tear or injury, left knee: Secondary | ICD-10-CM

## 2014-10-16 DIAGNOSIS — M23201 Derangement of unspecified lateral meniscus due to old tear or injury, left knee: Secondary | ICD-10-CM

## 2014-10-16 NOTE — Patient Instructions (Signed)
Good to see you Ice when you need it Brace with working.  Love the idea of the high dose vitmain d  See me again in 4 weeks to make sure you are better.

## 2014-10-16 NOTE — Progress Notes (Signed)
[name] [address] [place], [place] [address] Phone: [phone] Subjective:     CC: Right arm pain as well as piriformis follow-up  [id number] Felicia Perry is a 68 y.o. female coming in with complaint of right arm pain.  Patient was seen previously and does have cervical disc disorder as well as a calcific rotator cuff tendinopathy. At last visit patient did have osteopathic manipulation. Patient states  that the manipulation has been helpful. Patient has just had some soreness starting over the course last weekend. And denies any numbness or tingling. States that overall she seems to be improving.  Patient though did have problem today. Patient is having left knee pain. Patient's son was unfortunately in the hospital and she had to walk a significant distance going in and out of the hospital multiple times a day. Patient states since then she's been having more of a medial knee pain. States it hurts when she does twisting sensations. Does not remember any true injury just increasing locking. Patient states that it is affecting her job. States that he can keep her up at night. Describes it as a dull aching neck and has a sharp pain with certain movements. Patient puts the severity of 8 out of 10. Denies any swelling, locking or giving out on her.    Past medical history, social, surgical and family history all reviewed in electronic medical record.   Review of Systems: No headache, visual changes, nausea, vomiting, diarrhea, constipation, dizziness, abdominal pain, skin rash, fevers, chills, night sweats, weight loss, swollen lymph nodes, body aches, joint swelling, muscle aches, chest pain, shortness of breath, mood changes.   Objective Blood pressure 128/82, pulse 70, height 5' 1.75" (1.568 m), weight 181 lb (82.101 kg), SpO2 98 %.  General: No apparent distress alert and oriented x3 mood and affect normal, dressed appropriately.  HEENT: Pupils  equal, extraocular movements intact  Respiratory: Patient's speak in full sentences and does not appear short of breath  Cardiovascular: No lower extremity edema, non tender, no erythema  Skin: Warm dry intact with no signs of infection or rash on extremities or on axial skeleton.  Abdomen: Soft nontender  Neuro: Cranial nerves II through XII are intact, neurovascularly intact in all extremities with 2+ DTRs and 2+ pulses.  Lymph: No lymphadenopathy of posterior or anterior cervical chain or axillae bilaterally.  Gait normal with good balance and coordination.  MSK:  Non tender with full range of motion and good stability and symmetric strength and tone of elbows, wrist, hip and ankles bilaterally.  Knee: Left Normal to inspection with no erythema or effusion or obvious bony abnormalities. Patient is tender to palpation over the medial joint line ROM full in flexion and extension and lower leg rotation. Ligaments with solid consistent endpoints including ACL, PCL, LCL, MCL. Positive Mcmurray's, Apley's, and Thessalonian tests. Non painful patellar compression. Patellar glide without crepitus. Patellar and quadriceps tendons unremarkable. Hamstring and quadriceps strength is normal.   MSK US performed of: Left This study was ordered, performed, and interpreted by Charlann Boxer D.O.  Knee: All structures visualized. Anteromedial meniscus does have degenerative chronic changes as well as narrowing of the medial joint space. This will be moderate severity., anterolateral, posteromedial, and posterolateral menisci unremarkable without tearing, fraying, effusion, or displacement. Patellar Tendon unremarkable on long and transverse views without effusion. No abnormality of prepatellar bursa. LCL and MCL unremarkable on long and transverse views. No abnormality of origin of medial or lateral  head of the gastrocnemius.  IMPRESSION:  Chronic degenerative medial meniscal tear with moderate  osteophytic changes.  Procedure: Real-time Ultrasound Guided Injection of left knee Device: GE Logiq E  Ultrasound guided injection is preferred based studies that show increased duration, increased effect, greater accuracy, decreased procedural pain, increased response rate, and decreased cost with ultrasound guided versus blind injection.  Verbal informed consent obtained.  Time-out conducted.  Noted no overlying erythema, induration, or other signs of local infection.  Skin prepped in a sterile fashion.  Local anesthesia: Topical Ethyl chloride.  With sterile technique and under real time ultrasound guidance: With a 22-gauge 2 inch needle patient was injected with 4 cc of 0.5% Marcaine and 1 cc of Kenalog 40 mg/dL. This was from a superior lateral approach.  Completed without difficulty  Pain immediately resolved suggesting accurate placement of the medication.  Advised to call if fevers/chills, erythema, induration, drainage, or persistent bleeding.  Images permanently stored and available for review in the ultrasound unit.  Impression: Technically successful ultrasound guided injection.   Osteopathic findings of   Thoracic T2 extended rotated and side bent right inhaled second rib T5 extended rotated and side bent left     Impression and Recommendations:     This case required medical decision making of moderate complexity.

## 2014-10-17 DIAGNOSIS — M23209 Derangement of unspecified meniscus due to old tear or injury, unspecified knee: Secondary | ICD-10-CM | POA: Insufficient documentation

## 2014-10-17 DIAGNOSIS — M1991 Primary osteoarthritis, unspecified site: Secondary | ICD-10-CM | POA: Insufficient documentation

## 2014-10-17 NOTE — Assessment & Plan Note (Signed)
Patient does have moderate osteophytic changes as well as a chronic meniscal tear. Patient elected to try a steroid injection today. Patient also was given a medial unloader brace. We discussed icing protocol and home exercises. Patient will follow-up in 3-4 weeks for further evaluation and treatment.

## 2014-10-17 NOTE — Assessment & Plan Note (Signed)
Decision today to treat with OMT was based on Physical Exam  After verbal consent patient was treated with HVLA, ME techniques in thoracic and rib areas  Patient tolerated the procedure well with improvement in symptoms  Patient given exercises, stretches and lifestyle modifications  See medications in patient instructions if given  Patient will follow up in 2-3 weeks

## 2014-10-17 NOTE — Assessment & Plan Note (Signed)
Patient does have some cervical disorder but did respond very well to osteopathic manipulation. Patient did have some again today. We discussed icing put on home exercises. Patient will try to make these changes and then come back again in 3 weeks for further evaluation and treatment.

## 2014-10-26 HISTORY — PX: COLONOSCOPY: SHX174

## 2014-11-09 ENCOUNTER — Ambulatory Visit (INDEPENDENT_AMBULATORY_CARE_PROVIDER_SITE_OTHER): Payer: Medicare Other | Admitting: Family Medicine

## 2014-11-09 ENCOUNTER — Encounter: Payer: Self-pay | Admitting: Family Medicine

## 2014-11-09 VITALS — BP 140/80 | HR 65 | Temp 98.4°F | Wt 184.0 lb

## 2014-11-09 DIAGNOSIS — K219 Gastro-esophageal reflux disease without esophagitis: Secondary | ICD-10-CM

## 2014-11-09 DIAGNOSIS — F329 Major depressive disorder, single episode, unspecified: Secondary | ICD-10-CM

## 2014-11-09 DIAGNOSIS — E213 Hyperparathyroidism, unspecified: Secondary | ICD-10-CM

## 2014-11-09 DIAGNOSIS — I10 Essential (primary) hypertension: Secondary | ICD-10-CM

## 2014-11-09 DIAGNOSIS — F32A Depression, unspecified: Secondary | ICD-10-CM

## 2014-11-09 DIAGNOSIS — N2581 Secondary hyperparathyroidism of renal origin: Secondary | ICD-10-CM

## 2014-11-09 LAB — CALCIUM: CALCIUM: 9.4 mg/dL (ref 8.4–10.5)

## 2014-11-09 MED ORDER — AMLODIPINE BESYLATE-VALSARTAN 10-320 MG PO TABS: 1.0000 | ORAL_TABLET | Freq: Every day | ORAL | Status: DC

## 2014-11-09 MED ORDER — OMEPRAZOLE 40 MG PO CPDR: 40.0000 mg | DELAYED_RELEASE_CAPSULE | Freq: Every day | ORAL | Status: DC

## 2014-11-09 NOTE — Patient Instructions (Signed)
Blood pressure-take propranolol as well as combination amlodipine-valsartan. See me within a few days of stopping current medications. We will not continue hydrochlorothiazide.   Reflux-sent in prilosec/omeprazole

## 2014-11-09 NOTE — Progress Notes (Signed)
Garret Reddish, MD Phone: [phone]  Subjective:   Felicia Perry is a 69 y.o. year old very pleasant female patient who presents with the following:  Hyperparathyroidism- presumed control Depression-controlled on 09/18/14 was told to finish prescription and she was taking calcitriol twice a week at that time, finished that early December. Did not get the message about once a week as did not check mychart. States has had some left neck pain since coming off of medication. Not taking wellbutrin anymore and has done well. Stats she has felt clearer since being off.   Hypertension-reasonable control for age She is still taking last years medications. She has a Weeks worth of nadolol and tribenzor for blood pressure. I previously changed her to Hctz 12.5 mg (should not take because of calcium elevation), benicar 40mg  (Patient concern-cyst around tear duct-stopped after taking medicine for a few days when cyst had been there for 2 years), amlodipine 5mg  (states did not work). Propranolol in place of nadolol  GERD-controlled Still has dexilant for reflux. New plan does not cover this  ROS- no chest pain, shortness of breath, blurry vision. No SI/HI. Denies depressed mood. She is frustrated by coverage for her medications shifting though.   Past Medical History- Patient Active Problem List   Diagnosis Date Noted  . Hyperparathyroidism 09/19/2014    Priority: Medium  . Insomnia 07/13/2014    Priority: Medium  . Depression 04/22/2007    Priority: Medium  . Essential hypertension 04/22/2007    Priority: Medium  . ASTHMA 04/22/2007    Priority: Medium  . Limited joint range of motion 07/13/2014    Priority: Low  . Rash 07/13/2014    Priority: Low  . GERD (gastroesophageal reflux disease) 02/25/2011    Priority: Low  . CERVICAL DISC DISORDER 10/10/2007    Priority: Low  . LOW BACK PAIN 07/01/2007    Priority: Low  . Osteoarthritis of left lower extremity 10/17/2014  . Chronic  meniscal tear of knee 10/17/2014  . Nonallopathic lesion of thoracic region 09/14/2014  . Nonallopathic lesion-rib cage 08/21/2014  . Tendinopathy of right rotator cuff 07/30/2014  . Piriformis syndrome of right side 07/30/2014   Medications- reviewed and updated Current Outpatient Prescriptions  Medication Sig Dispense Refill  . acetaminophen (TYLENOL) 325 MG tablet Take 650 mg by mouth every 6 (six) hours as needed.    . cholecalciferol (VITAMIN D) 1000 UNITS tablet Take 1,000 Units by mouth daily.    Marland Kitchen levocetirizine (XYZAL) 5 MG tablet 1 tab every am 30 tablet 11  . propranolol (INDERAL) 40 MG tablet Take 1 tablet (40 mg total) by mouth daily. 30 tablet 11  . alprazolam (XANAX) 2 MG tablet TAKE 1/2 to ONE TABLET AT BEDTIME AS NEEDED FORSLEEP (Patient not taking: Reported on 11/09/2014) 30 tablet 5  . amLODipine (NORVASC) 5 MG tablet Take 1 tablet (5 mg total) by mouth daily. (Patient not taking: Reported on 11/09/2014) 30 tablet 11  . Dexlansoprazole 30 MG capsule Take 1 capsule (30 mg total) by mouth daily. (Patient not taking: Reported on 11/09/2014) 30 capsule 6  . hydrochlorothiazide (HYDRODIURIL) 12.5 MG tablet Take 1 tablet (12.5 mg total) by mouth daily. (Patient not taking: Reported on 11/09/2014) 30 tablet 11  . olmesartan (BENICAR) 40 MG tablet Take 1 tablet (40 mg total) by mouth daily. (Patient not taking: Reported on 11/09/2014) 30 tablet 11   Objective: BP 140/80 mmHg  Pulse 65  Temp(Src) 98.4 F (36.9 C)  Wt 184 lb (83.462 kg) Gen:  NAD, resting comfortably   Assessment/Plan:  Depression Controlled off of wellbutrin with phq9 of 3. Discussed reasons for return.    Hyperparathyroidism Calcium not elevated today off of calcitriol. I will forward this information to Dr. Cruzita Lederer as she had requested repeat after transition to once weekly calcitriol. Patient stopped instead of once a week but calcium still not elevated. She has planned endocrine follow up. We will stop her  HCTZ and not use in future.    Essential hypertension Reasonable control on tribenzor (Amlodipine, olmesartan, hctz) and nadolol. Nadolol transition to propranolol (neither ideal with asthma history but patient is the least nervous about this medication and most likely to take). Change to valsartan-amlodipine 320-10mg . Stop HCTZ. Follow up within a few days of change.    GERD (gastroesophageal reflux disease) GERD with history pepticu ulcer disease. Prilosec 40mg  generic sent in but not clear will be covered.    Return precautions advised. Follow up within a few days of BP medication changed.   Results for orders placed or performed in visit on 11/09/14 (from the past 24 hour(s))  Calcium     Status: None   Collection Time: 11/09/14  3:00 PM  Result Value Ref Range   Calcium 9.4 8.4 - 10.5 mg/dL   Meds ordered this encounter  Medications  . amLODipine-valsartan (EXFORGE) 10-320 MG per tablet    Sig: Take 1 tablet by mouth daily.    Dispense:  30 tablet    Refill:  5  . omeprazole (PRILOSEC) 40 MG capsule    Sig: Take 1 capsule (40 mg total) by mouth daily.    Dispense:  30 capsule    Refill:  5

## 2014-11-10 NOTE — Assessment & Plan Note (Signed)
Calcium not elevated today off of calcitriol. I will forward this information to Dr. Cruzita Lederer as she had requested repeat after transition to once weekly calcitriol. Patient stopped instead of once a week but calcium still not elevated. She has planned endocrine follow up. We will stop her HCTZ and not use in future.

## 2014-11-10 NOTE — Assessment & Plan Note (Signed)
Controlled off of wellbutrin with phq9 of 3. Discussed reasons for return.

## 2014-11-10 NOTE — Assessment & Plan Note (Addendum)
GERD with history pepticu ulcer disease. Prilosec 40mg  generic sent in but not clear will be covered.

## 2014-11-10 NOTE — Assessment & Plan Note (Signed)
Reasonable control on tribenzor (Amlodipine, olmesartan, hctz) and nadolol. Nadolol transition to propranolol (neither ideal with asthma history but patient is the least nervous about this medication and most likely to take). Change to valsartan-amlodipine 320-10mg . Stop HCTZ. Follow up within a few days of change.

## 2014-11-13 ENCOUNTER — Ambulatory Visit (INDEPENDENT_AMBULATORY_CARE_PROVIDER_SITE_OTHER): Payer: Medicare Other | Admitting: Family Medicine

## 2014-11-13 ENCOUNTER — Encounter: Payer: Self-pay | Admitting: Family Medicine

## 2014-11-13 VITALS — BP 138/86 | HR 68 | Ht 61.75 in | Wt 185.0 lb

## 2014-11-13 DIAGNOSIS — M23201 Derangement of unspecified lateral meniscus due to old tear or injury, left knee: Secondary | ICD-10-CM

## 2014-11-13 DIAGNOSIS — M999 Biomechanical lesion, unspecified: Secondary | ICD-10-CM

## 2014-11-13 DIAGNOSIS — M9908 Segmental and somatic dysfunction of rib cage: Secondary | ICD-10-CM

## 2014-11-13 DIAGNOSIS — M23207 Derangement of unspecified meniscus due to old tear or injury, left knee: Secondary | ICD-10-CM

## 2014-11-13 DIAGNOSIS — M9902 Segmental and somatic dysfunction of thoracic region: Secondary | ICD-10-CM

## 2014-11-13 DIAGNOSIS — M23204 Derangement of unspecified medial meniscus due to old tear or injury, left knee: Secondary | ICD-10-CM

## 2014-11-13 DIAGNOSIS — M9903 Segmental and somatic dysfunction of lumbar region: Secondary | ICD-10-CM

## 2014-11-13 DIAGNOSIS — M5441 Lumbago with sciatica, right side: Secondary | ICD-10-CM

## 2014-11-13 NOTE — Assessment & Plan Note (Signed)
Decision today to treat with OMT was based on Physical Exam  After verbal consent patient was treated with HVLA, ME techniques in thoracic and rib areas  Patient tolerated the procedure well with improvement in symptoms  Patient given exercises, stretches and lifestyle modifications  See medications in patient instructions if given  Patient will follow up in 3-4 weeks

## 2014-11-13 NOTE — Patient Instructions (Signed)
Good to see you Ice is your friend Try the medicine when hurting. Up to 2 times daily.  If hurt stomach stop but if helps we will send in prescription.  Continue brace with knee Try the tennisball behind rib and lay on it with 5# weight in right hand and see if you pop it in.  Get in the gym See me again in 3 weeks.

## 2014-11-13 NOTE — Progress Notes (Signed)
  [facility] [address], [place] [address] Phone: [phone] Subjective:     CC: Right arm pain as well as baclk pain and knee pain.   [id number] Felicia Perry is a 69 y.o. female coming in with complaint of right arm pain.  Patient was seen previously and does have cervical disc disorder as well as a calcific rotator cuff tendinopathy. At last visit patient did have osteopathic manipulation. Patient is responding very well to manipulation. Patient states that it does seem to work well but then when she starts doing activity again it seems to get worse. Patient states though that recently and has gotten worse but she has been working a lot more. Patient denies any new symptoms. Mild radiation down both arms but denies any neck pain that seems to be associated with it.  Patient was also seen previously and had a chronic meniscal tear. Patient was given an injection previously. Patient has been wearing the brace regularly. Patient overall is doing significantly better. States that it does not seem to be giving out on her, clicking or locking. Patient would like to become more active in start doing more exercising so she can lose weight.    Past medical history, social, surgical and family history all reviewed in electronic medical record.   Review of Systems: No headache, visual changes, nausea, vomiting, diarrhea, constipation, dizziness, abdominal pain, skin rash, fevers, chills, night sweats, weight loss, swollen lymph nodes, body aches, joint swelling, muscle aches, chest pain, shortness of breath, mood changes.   Objective Blood pressure 138/86, pulse 68, height 5' 1.75" (1.568 m), weight 185 lb (83.915 kg), SpO2 96 %.  General: No apparent distress alert and oriented x3 mood and affect normal, dressed appropriately.  HEENT: Pupils equal, extraocular movements intact  Respiratory: Patient's speak in full sentences and does not appear short of  breath  Cardiovascular: No lower extremity edema, non tender, no erythema  Skin: Warm dry intact with no signs of infection or rash on extremities or on axial skeleton.  Abdomen: Soft nontender  Neuro: Cranial nerves II through XII are intact, neurovascularly intact in all extremities with 2+ DTRs and 2+ pulses.  Lymph: No lymphadenopathy of posterior or anterior cervical chain or axillae bilaterally.  Gait normal with good balance and coordination.  MSK:  Non tender with full range of motion and good stability and symmetric strength and tone of elbows, wrist, hip and ankles bilaterally.  Knee: Left Normal to inspection with no erythema or effusion or obvious bony abnormalities. Nontender on exam ROM full in flexion and extension and lower leg rotation. Ligaments with solid consistent endpoints including ACL, PCL, LCL, MCL. Negative Mcmurray's, Apley's, and Thessalonian tests. Non painful patellar compression. Patellar glide without crepitus. Patellar and quadriceps tendons unremarkable. Hamstring and quadriceps strength is normal.    Osteopathic findings of   Thoracic T2 extended rotated and side bent right inhaled second rib T5 extended rotated and side bent left     Impression and Recommendations:     This case required medical decision making of moderate complexity.

## 2014-11-13 NOTE — Assessment & Plan Note (Signed)
Patient is doing significant better at this time. I think she is on heal fine. Patient is going to continue to wear the brace at work. Patient with continue icing at the end of the day and continue the exercises. Patient was given an exercise prescription to go back to the gym. Patient will come back and see me again in 3 weeks.

## 2014-11-13 NOTE — Assessment & Plan Note (Signed)
Patient had manipulation done again today. Patient is doing relatively well. Patient is encouraged to go to a gym. We discussed different activities in which exercises which will be good and which ones to avoid. Patient knows that weight loss would be very helpful. We discussed continuing the icing protocol as well as the nutritional supplements. Patient was given a trial some oral anti-inflammatories with a rotund pump inhibitor to avoid any stomach discomfort. Patient can take this with her other Prilosec. Patient will come back and see me again in 3 weeks for further evaluation and treatment.

## 2014-11-23 ENCOUNTER — Ambulatory Visit (INDEPENDENT_AMBULATORY_CARE_PROVIDER_SITE_OTHER): Payer: Medicare Other | Admitting: Family Medicine

## 2014-11-23 ENCOUNTER — Encounter: Payer: Self-pay | Admitting: Family Medicine

## 2014-11-23 VITALS — BP 120/82 | Temp 98.1°F | Wt 183.0 lb

## 2014-11-23 DIAGNOSIS — E213 Hyperparathyroidism, unspecified: Secondary | ICD-10-CM

## 2014-11-23 DIAGNOSIS — I10 Essential (primary) hypertension: Secondary | ICD-10-CM

## 2014-11-23 NOTE — Patient Instructions (Signed)
Calcium looked fine on repeat and Dr. Cruzita Lederer said no more calcium medicine needed.   Blood pressure is better than last few checks. Your exercise is helping and glad no side effects on medicine.   See you at your physical

## 2014-11-23 NOTE — Progress Notes (Signed)
  Garret Reddish, MD Phone: [phone]  Subjective:   Felicia Perry is a 69 y.o. year old very pleasant female patient who presents with the following:  Hypertension-controlled despite change in medication  BP Readings from Last 3 Encounters:  11/23/14 120/82  11/13/14 138/86  11/09/14 140/80  Home BP monitoring-no Compliant with medications-yes without side effects ROS-Denies any CP, HA, SOB,  LE edema, transient weakness.   Hyperparathyroidism- controlled Patient has been compliant with vitamin D. She has planned follow up with endocrinology in next few months ROS-no adbominal pain, no CVA tendernessor recent kidney stones  Past Medical History- Patient Active Problem List   Diagnosis Date Noted  . Hyperparathyroidism 09/19/2014    Priority: Medium  . Insomnia 07/13/2014    Priority: Medium  . Depression 04/22/2007    Priority: Medium  . Essential hypertension 04/22/2007    Priority: Medium  . ASTHMA 04/22/2007    Priority: Medium  . Limited joint range of motion 07/13/2014    Priority: Low  . Rash 07/13/2014    Priority: Low  . GERD (gastroesophageal reflux disease) 02/25/2011    Priority: Low  . CERVICAL DISC DISORDER 10/10/2007    Priority: Low  . LOW BACK PAIN 07/01/2007    Priority: Low  . Nonallopathic lesion of lumbosacral region 11/13/2014  . Osteoarthritis of left lower extremity 10/17/2014  . Chronic meniscal tear of knee 10/17/2014  . Nonallopathic lesion of thoracic region 09/14/2014  . Nonallopathic lesion-rib cage 08/21/2014  . Tendinopathy of right rotator cuff 07/30/2014  . Piriformis syndrome of right side 07/30/2014   Medications- reviewed and updated Current Outpatient Prescriptions  Medication Sig Dispense Refill  . acetaminophen (TYLENOL) 325 MG tablet Take 650 mg by mouth every 6 (six) hours as needed.    Marland Kitchen amLODipine-valsartan (EXFORGE) 10-320 MG per tablet Take 1 tablet by mouth daily. 30 tablet 5  . cholecalciferol (VITAMIN D)  1000 UNITS tablet Take 1,000 Units by mouth daily.    Marland Kitchen levocetirizine (XYZAL) 5 MG tablet 1 tab every am 30 tablet 11  . omeprazole (PRILOSEC) 40 MG capsule Take 1 capsule (40 mg total) by mouth daily. 30 capsule 5  . propranolol (INDERAL) 40 MG tablet Take 1 tablet (40 mg total) by mouth daily. 30 tablet 11  . alprazolam (XANAX) 2 MG tablet TAKE 1/2 to ONE TABLET AT BEDTIME AS NEEDED FORSLEEP (Patient not taking: Reported on 11/23/2014) 30 tablet 5   Objective: BP 120/82 mmHg  Temp(Src) 98.1 F (36.7 C)  Wt 183 lb (83.008 kg) Gen: NAD, resting comfortably CV: RRR no murmurs rubs or gallops Lungs: CTAB no crackles, wheeze, rhonchi Abdomen: soft/nontender/nondistended/normal bowel sounds.  Ext: trace edema above sockline, none below Skin: warm, dry, slight rash on left ankle, nonblanching erythematous papules   Assessment/Plan:  Hyperparathyroidism She has follow up with endocrine. Last calcium I checked was normal without calcitriol. Continue vitamin d alone for now.    Essential hypertension Controlled with Propranolol and valsartan-amlodipine 320-10mg . Patient was very anxious about change and is thankful controlled without side effects.    Follow up for physical within 6 months with labs 1 week before or at time of visit per patient preference

## 2014-11-24 NOTE — Assessment & Plan Note (Signed)
She has follow up with endocrine. Last calcium I checked was normal without calcitriol. Continue vitamin d alone for now.

## 2014-11-24 NOTE — Assessment & Plan Note (Signed)
Controlled with Propranolol and valsartan-amlodipine 320-10mg . Patient was very anxious about change and is thankful controlled without side effects.

## 2014-11-27 ENCOUNTER — Encounter: Payer: Self-pay | Admitting: Family Medicine

## 2014-11-27 ENCOUNTER — Telehealth: Payer: Self-pay | Admitting: Family Medicine

## 2014-11-27 ENCOUNTER — Ambulatory Visit (INDEPENDENT_AMBULATORY_CARE_PROVIDER_SITE_OTHER): Payer: Medicare Other | Admitting: Family Medicine

## 2014-11-27 VITALS — BP 142/70 | Temp 98.1°F | Wt 187.0 lb

## 2014-11-27 DIAGNOSIS — D692 Other nonthrombocytopenic purpura: Secondary | ICD-10-CM

## 2014-11-27 DIAGNOSIS — R21 Rash and other nonspecific skin eruption: Secondary | ICD-10-CM

## 2014-11-27 LAB — COMPREHENSIVE METABOLIC PANEL
ALK PHOS: 77 U/L (ref 39–117)
ALT: 14 U/L (ref 0–35)
AST: 12 U/L (ref 0–37)
Albumin: 4.1 g/dL (ref 3.5–5.2)
BUN: 18 mg/dL (ref 6–23)
CO2: 31 mEq/L (ref 19–32)
CREATININE: 0.83 mg/dL (ref 0.40–1.20)
Calcium: 9.3 mg/dL (ref 8.4–10.5)
Chloride: 105 mEq/L (ref 96–112)
GFR: 72.57 mL/min (ref >60.00)
Glucose, Bld: 141 mg/dL — ABNORMAL HIGH (ref 70–99)
Potassium: 3.5 mEq/L (ref 3.5–5.1)
Sodium: 141 mEq/L (ref 135–145)
Total Bilirubin: 0.8 mg/dL (ref 0.2–1.2)
Total Protein: 7 g/dL (ref 6.0–8.3)

## 2014-11-27 LAB — CBC WITH DIFFERENTIAL/PLATELET
BASOS ABS: 0.1 10*3/uL (ref 0.0–0.1)
Basophils Relative: 1 % (ref 0.0–3.0)
Eosinophils Absolute: 0.2 10*3/uL (ref 0.0–0.7)
Eosinophils Relative: 2 % (ref 0.0–5.0)
HCT: 40.7 % (ref 36.0–46.0)
HEMOGLOBIN: 13.7 g/dL (ref 12.0–15.0)
LYMPHS ABS: 1.9 10*3/uL (ref 0.7–4.0)
Lymphocytes Relative: 21.8 % (ref 12.0–46.0)
MCHC: 33.6 g/dL (ref 30.0–36.0)
MCV: 86.8 fl (ref 78.0–100.0)
MONO ABS: 0.8 10*3/uL (ref 0.1–1.0)
Monocytes Relative: 8.4 % (ref 3.0–12.0)
NEUTROS ABS: 5.9 10*3/uL (ref 1.4–7.7)
NEUTROS PCT: 66.8 % (ref 43.0–77.0)
PLATELETS: 272 10*3/uL (ref 150.0–400.0)
RBC: 4.69 Mil/uL (ref 3.87–5.11)
RDW: 13.5 % (ref 11.5–15.5)
WBC: 8.9 10*3/uL (ref 4.0–10.5)

## 2014-11-27 LAB — POCT URINALYSIS DIPSTICK
Bilirubin, UA: NEGATIVE
GLUCOSE UA: NEGATIVE
Ketones, UA: NEGATIVE
Leukocytes, UA: NEGATIVE
NITRITE UA: NEGATIVE
Protein, UA: NEGATIVE
RBC UA: NEGATIVE
SPEC GRAV UA: 1.015
Urobilinogen, UA: 0.2
pH, UA: 5.5

## 2014-11-27 LAB — PROTIME-INR
INR: 1 ratio (ref 0.8–1.0)
PROTHROMBIN TIME: 11 s (ref 9.6–13.1)

## 2014-11-27 LAB — SEDIMENTATION RATE: Sed Rate: 27 mm/hr — ABNORMAL HIGH (ref 0–22)

## 2014-11-27 LAB — APTT: aPTT: 29.3 s (ref 23.4–32.7)

## 2014-11-27 NOTE — Telephone Encounter (Signed)
Patient Name: Felicia Perry  DOB: 1946/06/02    Initial Comment Caller states the doctor changed here BP medicine. Now her legs are swollen and she has a rash on both of her legs.    Nurse Assessment  Nurse: Wynetta Emery, RN, Baker Janus Date/Time Eilene Ghazi Time): 11/27/2014 8:37:28 AM  Confirm and document reason for call. If symptomatic, describe symptoms. ---Shenea thanked Nurse for calling, but states Doris from the office called her back and she has worked her in today for an appt.  Has the patient traveled out of the country within the last 30 days? ---No  Does the patient require triage? ---No  Please document clinical information provided and list any resource used. ---Nurse thanked her for her time. Wished her a Blessed Day and disconnected line.     Guidelines    Guideline Title Affirmed Question Affirmed Notes       Final Disposition User

## 2014-11-27 NOTE — Assessment & Plan Note (Signed)
Recurrent rash with worsening today. On exam, nonblanching macules and patches. Concerning for petechiae/purpura. Obtain labs including CBC, PT/ptt, esr, ANA, ua. Discussed case with Dr. Sherren Mocha and we considered dermatology and hematology. Ultimately decided to refer to hematology with consideratoin of dermatology referral to Dr. Amy Martinique of [facility] dermatology if in network. UA normal, PT/PTT normal, Sed rate minimally elevated, platelets in normal range. A vasculitis induced by medication is possible but given recurrence since 1989 doubt valsartan is true cause as was not on it previously when occurred. Strict return precautions advised.

## 2014-11-27 NOTE — Progress Notes (Signed)
Garret Reddish, MD Phone: [phone]  Subjective:   Felicia Perry is a 69 y.o. year old very pleasant female patient who presents with the following:  Recurrent Rash Patient has described a rash on bilateral ankles that will come and go every few months since 1989. 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 She has noticed a slight rash on her ankles when I saw her on 11/23/13 which I described as nonblanching erythematous papules (in retrospect they were not in fact raised so would be macules). Patient states the rash has spread up her ankle and to both sides of her socks which it never has before. Her legs feel "tight" with swelling but denies joint pain or abdominal pain. She did have her blood pressure regimen altered recently from benicar to valsartan and hctz stopped. Her left leg seems worse than right. Did seem to spread at first but now has been stable for about a day. She does not take aspirin or NSAIDs due to history of GI bleeds  ROS-not ill appearing, no fever/chills. Not immunocompromised. No mucus membrane involvement.    Past Medical History- no history of injury to her spleen, hyperparathyroidism, depression, HTN, asthma, GERD,   Medications- reviewed and updated Current Outpatient Prescriptions  Medication Sig Dispense Refill  . acetaminophen (TYLENOL) 325 MG tablet Take 650 mg by mouth every 6 (six) hours as needed.    Marland Kitchen amLODipine-valsartan (EXFORGE) 10-320 MG per tablet Take 1 tablet by mouth daily. 30 tablet 5  . cholecalciferol (VITAMIN D) 1000 UNITS tablet Take 1,000 Units by mouth daily.    Marland Kitchen levocetirizine (XYZAL) 5 MG tablet 1 tab every am 30 tablet 11  . omeprazole (PRILOSEC) 40 MG capsule Take 1 capsule (40 mg total) by mouth daily. 30 capsule 5  . propranolol (INDERAL) 40 MG tablet Take 1 tablet (40 mg total) by mouth daily. 30 tablet 11  . alprazolam (XANAX) 2 MG tablet TAKE 1/2 to ONE TABLET AT BEDTIME  AS NEEDED FORSLEEP (Patient not taking: Reported on 11/27/2014) 30 tablet 5     Objective: BP 142/70 mmHg  Temp(Src) 98.1 F (36.7 C)  Wt 187 lb (84.823 kg) Gen: NAD, resting comfortably No oral lesions CV: RRR no murmurs rubs or gallops Lungs: CTAB no crackles, wheeze, rhonchi Abdomen: soft/nontender/nondistended/normal bowel sounds. No rebound or guarding. Difficult exam but no obvious hepatosplenomegaly Ext: 1+ bilateral edema (new) Skin: warm, dry, nonblanching erythematous macules and patches Neuro: grossly normal, moves all extremities     Results for orders placed or performed in visit on 11/27/14 (from the past 24 hour(s))  CBC with Differential/Platelet     Status: None   Collection Time: 11/27/14 10:41 AM  Result Value Ref Range   WBC 8.9 4.0 - 10.5 K/uL   RBC 4.69 3.87 - 5.11 Mil/uL   Hemoglobin 13.7 12.0 - 15.0 g/dL   HCT 40.7 36.0 - 46.0 %   MCV 86.8 78.0 - 100.0 fl   MCHC 33.6 30.0 - 36.0 g/dL   RDW 13.5 11.5 - 15.5 %   Platelets 272.0 150.0 - 400.0 K/uL   Neutrophils Relative % 66.8 43.0 - 77.0 %   Lymphocytes Relative 21.8 12.0 - 46.0 %   Monocytes Relative 8.4 3.0 - 12.0 %   Eosinophils Relative 2.0 0.0 - 5.0 %   Basophils Relative 1.0 0.0 - 3.0 %   Neutro Abs 5.9 1.4 - 7.7 K/uL   Lymphs  Abs 1.9 0.7 - 4.0 K/uL   Monocytes Absolute 0.8 0.1 - 1.0 K/uL   Eosinophils Absolute 0.2 0.0 - 0.7 K/uL   Basophils Absolute 0.1 0.0 - 0.1 K/uL  Comprehensive metabolic panel     Status: Abnormal   Collection Time: 11/27/14 10:41 AM  Result Value Ref Range   Sodium 141 135 - 145 mEq/L   Potassium 3.5 3.5 - 5.1 mEq/L   Chloride 105 96 - 112 mEq/L   CO2 31 19 - 32 mEq/L   Glucose, Bld 141 (H) 70 - 99 mg/dL   BUN 18 6 - 23 mg/dL   Creatinine, Ser 0.83 0.40 - 1.20 mg/dL   Total Bilirubin 0.8 0.2 - 1.2 mg/dL   Alkaline Phosphatase 77 39 - 117 U/L   AST 12 0 - 37 U/L   ALT 14 0 - 35 U/L   Total Protein 7.0 6.0 - 8.3 g/dL   Albumin 4.1 3.5 - 5.2 g/dL   Calcium 9.3  8.4 - 10.5 mg/dL   GFR 72.57 >60.00 mL/min  Protime-INR     Status: None   Collection Time: 11/27/14 10:41 AM  Result Value Ref Range   INR 1.0 0.8 - 1.0 ratio   Prothrombin Time 11.0 9.6 - 13.1 sec  APTT     Status: None   Collection Time: 11/27/14 10:41 AM  Result Value Ref Range   aPTT 29.3 23.4 - 32.7 SEC  Sedimentation rate     Status: Abnormal   Collection Time: 11/27/14 10:41 AM  Result Value Ref Range   Sed Rate 27 (H) 0 - 22 mm/hr  POCT urinalysis dipstick     Status: None   Collection Time: 11/27/14 11:20 AM  Result Value Ref Range   Color, UA yellow    Clarity, UA clear    Glucose, UA n    Bilirubin, UA n    Ketones, UA n    Spec Grav, UA 1.015    Blood, UA n    pH, UA 5.5    Protein, UA n    Urobilinogen, UA 0.2    Nitrite, UA n    Leukocytes, UA Negative      Assessment/Plan:  Recurrent Rash Recurrent rash with worsening today. On exam, nonblanching macules and patches. Concerning for petechiae/purpura. Obtain labs including CBC, PT/ptt, esr, ANA, ua. Discussed case with Dr. Sherren Mocha and we considered dermatology and hematology. Ultimately decided to refer to hematology with consideratoin of dermatology referral to Dr. Amy Martinique of [facility] dermatology if in network. UA normal, PT/PTT normal, Sed rate minimally elevated, platelets in normal range. A vasculitis induced by medication is possible but given recurrence since 1989 doubt valsartan is true cause as was not on it previously when occurred. Strict return precautions advised.   Follow up by phone (asked patient to call) within a few days of hematology visit.   Orders Placed This Encounter  Procedures  . CBC with Differential/Platelet  . Comprehensive metabolic panel    [place]  . Protime-INR  . APTT  . Sedimentation rate    [place]  . ANA  . Ambulatory referral to Hematology    Referral Priority:  Routine    Referral Type:  Consultation    Referral Reason:  Specialty Services Required     Requested Specialty:  Oncology    Number of Visits Requested:  1  . POCT urinalysis dipstick

## 2014-11-27 NOTE — Patient Instructions (Addendum)
Rash  See me back if any fevers, expansion up the leg, worsening swelling, shortness of breath  Baseline labs today  Send you to hematology to further evaluate

## 2014-11-28 LAB — ANA: ANA: POSITIVE — AB

## 2014-11-28 LAB — ANTI-NUCLEAR AB-TITER (ANA TITER): ANA Titer 1: 1:40 {titer} — ABNORMAL HIGH

## 2014-11-30 ENCOUNTER — Ambulatory Visit: Payer: Medicare Other | Admitting: Family Medicine

## 2014-12-04 ENCOUNTER — Ambulatory Visit (INDEPENDENT_AMBULATORY_CARE_PROVIDER_SITE_OTHER): Payer: Medicare Other | Admitting: Family Medicine

## 2014-12-04 ENCOUNTER — Ambulatory Visit (INDEPENDENT_AMBULATORY_CARE_PROVIDER_SITE_OTHER)
Admission: RE | Admit: 2014-12-04 | Discharge: 2014-12-04 | Disposition: A | Discharge status: Home / Self Care | Payer: Medicare Other | Source: Ambulatory Visit | Attending: Family Medicine | Admitting: Family Medicine

## 2014-12-04 ENCOUNTER — Encounter: Payer: Self-pay | Admitting: Family Medicine

## 2014-12-04 VITALS — BP 132/86 | HR 77 | Ht 61.75 in | Wt 181.0 lb

## 2014-12-04 DIAGNOSIS — M5090 Cervical disc disorder, unspecified, unspecified cervical region: Secondary | ICD-10-CM

## 2014-12-04 DIAGNOSIS — G479 Sleep disorder, unspecified: Secondary | ICD-10-CM | POA: Insufficient documentation

## 2014-12-04 DIAGNOSIS — R0689 Other abnormalities of breathing: Secondary | ICD-10-CM

## 2014-12-04 DIAGNOSIS — M9902 Segmental and somatic dysfunction of thoracic region: Secondary | ICD-10-CM

## 2014-12-04 DIAGNOSIS — M9908 Segmental and somatic dysfunction of rib cage: Secondary | ICD-10-CM

## 2014-12-04 DIAGNOSIS — M999 Biomechanical lesion, unspecified: Secondary | ICD-10-CM

## 2014-12-04 NOTE — Patient Instructions (Signed)
Good to see you Your labs look good overall, keep up with Dr. Yong Channel.  I think a hematologist could help.  We will get xray today We have sent in referral for a sleep study.  Try to pop the rib in again with your friend.  See me again in 3-4 weeks.

## 2014-12-04 NOTE — Assessment & Plan Note (Signed)
Decision today to treat with OMT was based on Physical Exam  After verbal consent patient was treated with HVLA, ME techniques in thoracic and rib areas  Patient tolerated the procedure well with improvement in symptoms  Patient given exercises, stretches and lifestyle modifications  See medications in patient instructions if given  Patient will follow up in 3-4 weeks

## 2014-12-04 NOTE — Progress Notes (Signed)
Pre visit review using our clinic review tool, if applicable. No additional management support is needed unless otherwise documented below in the visit note. 

## 2014-12-04 NOTE — Assessment & Plan Note (Signed)
Patient overall is responding to manipulation. Discuss patient change in her posture and try augment some of her daily activities. 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 We discussed icing, home exercises and we did discuss self manipulation techniques Patient try to make these changes and come back again in 4 weeks for further evaluation.

## 2014-12-04 NOTE — Progress Notes (Signed)
  [name] [address] [place], [place] [address] Phone: [phone] Subjective:     CC: Right arm pain as well as baclk pain and knee pain.   [id number] Felicia Perry is a 69 y.o. female coming in with complaint of right arm pain.  Patient was seen previously and does have cervical disc disorder as well as a calcific rotator cuff tendinopathy. At last visit patient did have osteopathic manipulation. Patient is responding very well to manipulation. Patient did have manipulation of the second rib at last exam which seemed to be somewhat beneficial as well. Patient states overall when she does have the manipulation she does quite well but Rachel Moulds last 1-2 days and then it seems that the rigid comes out of place again. Patient is accompanied with a friend to try to teach her how to do this and help her with some manipulation techniques. Patient continues to be able to do daily activities. Patient denies any associated neck pain or radiation down the arm significantly at this time. Seems to be localized to the right shoulder. It is not waking her up to sleep.   Patient though it has had trouble sleeping. Patient states that she's been waking up family she is having trouble breathing. Patient does have a history of asthma and has not used her inhaler anymore. Patient does not know she snores but states that she is waking up multiple times throughout the night.       Past medical history, social, surgical and family history all reviewed in electronic medical record.   Review of Systems: No headache, visual changes, nausea, vomiting, diarrhea, constipation, dizziness, abdominal pain, skin rash, fevers, chills, night sweats, weight loss, swollen lymph nodes, body aches, joint swelling, muscle aches, chest pain, shortness of breath, mood changes.   Objective Blood pressure 132/86, pulse 77, height 5' 1.75" (1.568 m), weight 181 lb (82.101 kg), SpO2 98 %.  General:  No apparent distress alert and oriented x3 mood and affect normal, dressed appropriately.  HEENT: Pupils equal, extraocular movements intact  Respiratory: Patient's speak in full sentences and does not appear short of breath  Cardiovascular: No lower extremity edema, non tender, no erythema  Skin: Warm dry intact with no signs of infection or rash on extremities or on axial skeleton.  Abdomen: Soft nontender  Neuro: Cranial nerves II through XII are intact, neurovascularly intact in all extremities with 2+ DTRs and 2+ pulses.  Lymph: No lymphadenopathy of posterior or anterior cervical chain or axillae bilaterally.  Gait normal with good balance and coordination.  MSK:  Non tender with full range of motion and good stability and symmetric strength and tone of elbows, wrist, hip and ankles bilaterally.  Neck: Inspection unremarkable. No palpable stepoffs. Negative Spurling's maneuver. Full neck range of motion Grip strength and sensation normal in bilateral hands Strength good C4 to T1 distribution No sensory change to C4 to T1 Negative Hoffman sign bilaterally Reflexes normal  Osteopathic findings of   Thoracic T2 extended rotated and side bent right inhaled second rib T5 extended rotated and side bent left     Impression and Recommendations:     This case required medical decision making of moderate complexity.

## 2014-12-04 NOTE — Assessment & Plan Note (Signed)
Sleep study ordered rule out sleep apnea x-rays ordered today to rule out any other signs of shortness of breath. Patient has had history of pulmonary nodules previously.

## 2014-12-14 ENCOUNTER — Encounter: Payer: Self-pay | Admitting: Family Medicine

## 2014-12-14 ENCOUNTER — Telehealth: Payer: Self-pay

## 2014-12-14 DIAGNOSIS — R233 Spontaneous ecchymoses: Secondary | ICD-10-CM

## 2014-12-14 NOTE — Telephone Encounter (Signed)
It dosent look like Dr. Yong Channel entered hematology referral on 11/27/14 when pt saw Dr. Yong Channel. Referral to hematology has been placed as of this morning.

## 2014-12-28 ENCOUNTER — Encounter: Payer: Self-pay | Admitting: Family Medicine

## 2014-12-28 ENCOUNTER — Ambulatory Visit (INDEPENDENT_AMBULATORY_CARE_PROVIDER_SITE_OTHER)
Admission: RE | Admit: 2014-12-28 | Discharge: 2014-12-28 | Disposition: A | Discharge status: Home / Self Care | Payer: Medicare Other | Source: Ambulatory Visit | Attending: Family Medicine | Admitting: Family Medicine

## 2014-12-28 ENCOUNTER — Ambulatory Visit (INDEPENDENT_AMBULATORY_CARE_PROVIDER_SITE_OTHER): Payer: Medicare Other | Admitting: Family Medicine

## 2014-12-28 VITALS — BP 140/82 | HR 78 | Wt 185.0 lb

## 2014-12-28 DIAGNOSIS — M5090 Cervical disc disorder, unspecified, unspecified cervical region: Secondary | ICD-10-CM

## 2014-12-28 DIAGNOSIS — M9902 Segmental and somatic dysfunction of thoracic region: Secondary | ICD-10-CM

## 2014-12-28 DIAGNOSIS — E213 Hyperparathyroidism, unspecified: Secondary | ICD-10-CM

## 2014-12-28 DIAGNOSIS — M9908 Segmental and somatic dysfunction of rib cage: Secondary | ICD-10-CM

## 2014-12-28 DIAGNOSIS — M999 Biomechanical lesion, unspecified: Secondary | ICD-10-CM

## 2014-12-28 DIAGNOSIS — R0689 Other abnormalities of breathing: Secondary | ICD-10-CM

## 2014-12-28 DIAGNOSIS — M9903 Segmental and somatic dysfunction of lumbar region: Secondary | ICD-10-CM

## 2014-12-28 MED ORDER — GABAPENTIN 100 MG PO CAPS: 100.0000 mg | ORAL_CAPSULE | Freq: Every day | ORAL | Status: DC

## 2014-12-28 NOTE — Assessment & Plan Note (Addendum)
Could be contributing, patient is follow up with other providers soon.

## 2014-12-28 NOTE — Progress Notes (Signed)
Pre visit review using our clinic review tool, if applicable. No additional management support is needed unless otherwise documented below in the visit note. 

## 2014-12-28 NOTE — Progress Notes (Signed)
  [name] [address] [place], [place] [address] Phone: [phone] Subjective:     CC: Right arm pain as well as baclk pain   [id number] Felicia Perry is a 69 y.o. female coming in with complaint of right arm pain.  Patient was seen previously and does have cervical disc disorder as well as a calcific rotator cuff tendinopathy. At last visit patient did have osteopathic manipulation. Patient is responding very well to manipulation. Patient did have manipulation of the second rib at last exam which seemed to be somewhat beneficial as well. Patient at last exam brought her friend to learn how to do more the self manipulation. Patient states they rib continues to give her difficulty especially at the end of a long day. Patient denies significant radiation down the arm but still has some mild radiation. Patient states that he can actually occur in both shoulders sometimes. Seems to be worse at night and seems to go away by the morning. Patient states that she does not notice any change position of her neck that seems to make it worse.     Past medical history, social, surgical and family history all reviewed in electronic medical record.   Review of Systems: No headache, visual changes, nausea, vomiting, diarrhea, constipation, dizziness, abdominal pain, skin rash, fevers, chills, night sweats, weight loss, swollen lymph nodes, body aches, joint swelling, muscle aches, chest pain, shortness of breath, mood changes.   Objective Blood pressure 140/82, pulse 78, weight 185 lb (83.915 kg), SpO2 97 %.  General: No apparent distress alert and oriented x3 mood and affect normal, dressed appropriately.  HEENT: Pupils equal, extraocular movements intact  Respiratory: Patient's speak in full sentences and does not appear short of breath  Cardiovascular: No lower extremity edema, non tender, no erythema  Skin: Warm dry intact with no signs of infection or rash on  extremities or on axial skeleton.  Abdomen: Soft nontender  Neuro: Cranial nerves II through XII are intact, neurovascularly intact in all extremities with 2+ DTRs and 2+ pulses.  Lymph: No lymphadenopathy of posterior or anterior cervical chain or axillae bilaterally.  Gait normal with good balance and coordination.  MSK:  Non tender with full range of motion and good stability and symmetric strength and tone of elbows, wrist, hip and ankles bilaterally.  Neck: Inspection unremarkable. No palpable stepoffs. Negative Spurling's maneuver. Full neck range of motion Grip strength and sensation normal in bilateral hands Strength good C4 to T1 distribution No sensory change to C4 to T1 Negative Hoffman sign bilaterally Reflexes normal  Osteopathic findings of   Thoracic T2 extended rotated and side bent right inhaled second rib T5 extended rotated and side bent left     Impression and Recommendations:     This case required medical decision making of moderate complexity.

## 2014-12-28 NOTE — Assessment & Plan Note (Signed)
We'll get x-rays to further evaluate

## 2014-12-28 NOTE — Patient Instructions (Addendum)
Good to see you as always Lets start gabapentin 100mg  at night Ice is your friend still Lets get xrays of your neck today to make sure we are not missing anything See me again in 4 weeks.

## 2014-12-28 NOTE — Assessment & Plan Note (Signed)
Decision today to treat with OMT was based on Physical Exam  After verbal consent patient was treated with HVLA, ME techniques in thoracic and rib areas  Patient tolerated the procedure well with improvement in symptoms  Patient given exercises, stretches and lifestyle modifications  See medications in patient instructions if given  Patient will follow up in 3-4 weeks

## 2015-01-01 ENCOUNTER — Telehealth: Payer: Self-pay | Admitting: Family Medicine

## 2015-01-01 NOTE — Telephone Encounter (Signed)
appt opened up and resch on hunter's schedule

## 2015-01-01 NOTE — Telephone Encounter (Signed)
Pt states she has upper resp w/ cough, coughing up green mucus, head stopped up, weak, ears stopped up and would like to know if you will call in a zpak. Advised pt  she would need appt . You were booked. Scheduled w/ dr Raliegh Ip. Is that ok?

## 2015-01-02 ENCOUNTER — Encounter: Payer: Self-pay | Admitting: Family Medicine

## 2015-01-02 ENCOUNTER — Ambulatory Visit (INDEPENDENT_AMBULATORY_CARE_PROVIDER_SITE_OTHER): Payer: Medicare Other | Admitting: Family Medicine

## 2015-01-02 ENCOUNTER — Ambulatory Visit: Payer: Medicare Other | Admitting: Internal Medicine

## 2015-01-02 VITALS — BP 140/90 | Temp 98.7°F | Wt 185.0 lb

## 2015-01-02 DIAGNOSIS — B9689 Other specified bacterial agents as the cause of diseases classified elsewhere: Secondary | ICD-10-CM

## 2015-01-02 DIAGNOSIS — J329 Chronic sinusitis, unspecified: Secondary | ICD-10-CM

## 2015-01-02 DIAGNOSIS — A499 Bacterial infection, unspecified: Secondary | ICD-10-CM

## 2015-01-02 MED ORDER — AZITHROMYCIN 250 MG PO TABS: ORAL_TABLET | ORAL | Status: DC

## 2015-01-02 NOTE — Patient Instructions (Addendum)
Will contact insurance about shingles and Pneumovax  Will get both injections at April visit if insurance covers   Bacterial Sinusitis  Treat with azithromycin given many antibiotic allergies and weight gain on prednisone.   Let's follow up next week to see how you are doing and consider changing blood pressure medications

## 2015-01-02 NOTE — Progress Notes (Signed)
Garret Reddish, MD Phone: [phone]  Subjective:   Felicia Perry is a 69 y.o. year old very pleasant female patient who presents with the following:  Bacterial sinusitis -started with chills and feverish feelings last Monday. Temperature up to 102 last week. Coughing seems violent at times. States coughed herself out of the bed at one point and bumped head. Yellow/green/brown discharge from nose as well as coughing up. Mild sore throat and loss of voice. Last fever was 2 days ago. Slightly better but persistent symptoms. Upper teeth pressure prominent. No resolution of symptoms other than fever. Mucinex D helps some (warned of BP issues)  ROS-No shortness of breath or chest pain. Occasional wheeze at night, has not started symbicort.   Past Medical History- Patient Active Problem List   Diagnosis Date Noted  . Hyperparathyroidism 09/19/2014    Priority: Medium  . Insomnia 07/13/2014    Priority: Medium  . Depression 04/22/2007    Priority: Medium  . Essential hypertension 04/22/2007    Priority: Medium  . ASTHMA 04/22/2007    Priority: Medium  . Limited joint range of motion 07/13/2014    Priority: Low  . Rash 07/13/2014    Priority: Low  . GERD (gastroesophageal reflux disease) 02/25/2011    Priority: Low  . CERVICAL DISC DISORDER 10/10/2007    Priority: Low  . LOW BACK PAIN 07/01/2007    Priority: Low  . Difficulty sleeping 12/04/2014  . Nonallopathic lesion of lumbosacral region 11/13/2014  . Osteoarthritis of left lower extremity 10/17/2014  . Chronic meniscal tear of knee 10/17/2014  . Nonallopathic lesion of thoracic region 09/14/2014  . Nonallopathic lesion-rib cage 08/21/2014  . Tendinopathy of right rotator cuff 07/30/2014  . Piriformis syndrome of right side 07/30/2014   Medications- reviewed and updated Current Outpatient Prescriptions  Medication Sig Dispense Refill  . amLODipine-valsartan (EXFORGE) 10-320 MG per tablet Take 1 tablet by mouth daily.  30 tablet 5  . cholecalciferol (VITAMIN D) 1000 UNITS tablet Take 1,000 Units by mouth daily.    Marland Kitchen gabapentin (NEURONTIN) 100 MG capsule Take 1 capsule (100 mg total) by mouth at bedtime. 30 capsule 3  . levocetirizine (XYZAL) 5 MG tablet 1 tab every am 30 tablet 11  . omeprazole (PRILOSEC) 40 MG capsule Take 1 capsule (40 mg total) by mouth daily. 30 capsule 5  . propranolol (INDERAL) 40 MG tablet Take 1 tablet (40 mg total) by mouth daily. 30 tablet 11  . acetaminophen (TYLENOL) 325 MG tablet Take 650 mg by mouth every 6 (six) hours as needed.    Marland Kitchen alprazolam (XANAX) 2 MG tablet TAKE 1/2 to ONE TABLET AT BEDTIME AS NEEDED FORSLEEP (Patient not taking: Reported on 01/02/2015) 30 tablet 5   Objective: BP 140/90 mmHg  Temp(Src) 98.7 F (37.1 C)  Wt 185 lb (83.915 kg) Gen: NAD, appears fatigued TM normal, oropharynx normal, yellow drainage bilateral nares noted with swollen turbinates. Some maxillary sinus pressure.  CV: RRR no murmurs rubs or gallops Lungs: CTAB no crackles, wheeze, rhonchi Abdomen: soft/nontender/nondistended/normal bowel sounds. No rebound or guarding.  Ext: 1+ pitting edema bilaterally (trace 1/29 and 1+ on last visit0 Skin: warm, dry Neuro: grossly normal, moves all extremities    Assessment/Plan:  Bacterial sinusitis Based on sinus pressure, purulent drainage at 10 days without relief. Also had fevers and purulent drainage last week. Multitude of antibiotic allergies. States has tolerated zpak before though clarithromycin allergy. PCN and quinolone allergy limiting 1st options and also with doxy allergy. We discussed trial  of zpak vs. Prednisone but she swells on this and already has some leg swelling   Return precautions advised. I asked her to follow up next week to discuss her edema. She states it is making it hard to work. 1+ on exam but states worse at work when on feet. We discussed may need to d/c amlodipine but wanted to get her well from sinusitis first. I  would consider valsartan 320mg , prpranolol 40mg . Stop amlodipine. Start lasix 40mg . Cant take HCTZ, spironolactone with valsartan could cause hyperkalemia. Options truly limited-consider hydralazine?  Meds ordered this encounter  Medications  . azithromycin (ZITHROMAX) 250 MG tablet    Sig: Take 2 tabs on day 1, then 1 tab daily until finished    Dispense:  6 tablet    Refill:  0

## 2015-01-02 NOTE — Telephone Encounter (Signed)
Perfect-thanks Juliann Pulse!

## 2015-01-07 ENCOUNTER — Telehealth: Payer: Self-pay

## 2015-01-07 MED ORDER — PREDNISONE 20 MG PO TABS: ORAL_TABLET | ORAL | Status: DC

## 2015-01-07 NOTE — Telephone Encounter (Signed)
Sent in rx.

## 2015-01-07 NOTE — Telephone Encounter (Signed)
Pt said she would like the prednisone sent in and wants you to know that its one of the BP meds that makes her swell and not the prednisone so if you would like to send in a higher dose of prednisone that will be fine. She will update [place] after the 5th day of taking it.

## 2015-01-07 NOTE — Telephone Encounter (Signed)
Lm on pt vm notifying her of Rx being sent

## 2015-01-07 NOTE — Telephone Encounter (Signed)
Pt states she finished Z pak yesterday and had no reactions and it did help but she still is sick and cant hear good out of her ears and her voice is still coming and going. She would like to know if she needs something stronger, she feels a tad better she is trying to work today but her sickness is not completely gone and you wanted her to f/u with [place] today to let us know how she is doing. Please advise.

## 2015-01-07 NOTE — Telephone Encounter (Signed)
Appreciate the update. Options would be limited. I know she swells on the prednisone but I could try a low dose for 5 days if she desires to see if that would help. This may help decrease the inflammation some more but I suspect this may still take some time to resolve.

## 2015-01-09 ENCOUNTER — Other Ambulatory Visit: Payer: Self-pay | Admitting: Family Medicine

## 2015-01-10 MED ORDER — ALPRAZOLAM 2 MG PO TABS
ORAL_TABLET | ORAL | Status: DC
Start: 2015-01-10 — End: 2015-01-29

## 2015-01-10 NOTE — Telephone Encounter (Signed)
Ok to refill 

## 2015-01-10 NOTE — Telephone Encounter (Signed)
yes

## 2015-01-14 ENCOUNTER — Telehealth: Payer: Self-pay

## 2015-01-14 NOTE — Telephone Encounter (Signed)
Pt states her ears are still inflammed and hurt and she is getting congested again. She states you can send in a higher dose of prednisone or something else "to knock this out". Pt would like to know what you suggest.

## 2015-01-15 ENCOUNTER — Ambulatory Visit (INDEPENDENT_AMBULATORY_CARE_PROVIDER_SITE_OTHER): Payer: Medicare Other | Admitting: Internal Medicine

## 2015-01-15 ENCOUNTER — Encounter: Payer: Self-pay | Admitting: Internal Medicine

## 2015-01-15 VITALS — BP 108/70 | HR 74 | Temp 98.3°F | Resp 12 | Wt 180.6 lb

## 2015-01-15 DIAGNOSIS — E213 Hyperparathyroidism, unspecified: Secondary | ICD-10-CM | POA: Diagnosis not present

## 2015-01-15 LAB — VITAMIN D 25 HYDROXY (VIT D DEFICIENCY, FRACTURES): VITD: 19.91 ng/mL — ABNORMAL LOW (ref 30.00–100.00)

## 2015-01-15 MED ORDER — PREDNISONE 50 MG PO TABS: 50.0000 mg | ORAL_TABLET | Freq: Every day | ORAL | Status: DC

## 2015-01-15 NOTE — Progress Notes (Signed)
Patient ID: Felicia Perry, female   DOB: 05/10/1946, 69 y.o.   MRN: [id number]   HPI  Felicia Perry is a 69 y.o.-year-old female, referred by her PCP, Dr. Yong Channel, in consultation for normocalcemic hyperparathyroidism. Pt previously seen by Dr Loanne Drilling. Last visit with me was 4 mo ago.  Pt was dx with hyperparathyroidism in 2012. I reviewed pt's pertinent labs - latest PTH normal, checked with [facility] assays: Lab Results  Component Value Date   PTH 21 09/18/2014   PTH 30 07/30/2014   PTH 50.0 07/21/2013   PTH 87.2* 08/19/2012   PTH 54.7 02/19/2012   PTH 84.7* 11/13/2011   PTH 72.7* 09/21/2011    Lab Results  Component Value Date   CALCIUM 9.3 11/27/2014   CALCIUM 9.4 11/09/2014   CALCIUM 9.2 09/25/2014   CALCIUM 9.4 09/18/2014   CALCIUM 9.6 07/30/2014   CALCIUM 10.6* 08/25/2013   CALCIUM 9.7 07/21/2013   CALCIUM 9.2 10/07/2012   CALCIUM 9.5 08/19/2012   CALCIUM 9.9 02/19/2012   Of note, she had a NM parathyroid scan on (11/30/2011): Faint focus of residual activity within the right lobe of thyroid gland. This is indeterminate finding but could represent the site of a parathyroid adenoma. Consider follow-up scan or cross sectional imaging of the neck.  Had a hand fracture in 2014 (traumatic).  No h/o osteoporosis. 09/26/2014: Results:  Lumbar spine (L1-L4) Femoral neck (FN) 33% distal radius  T-score + 0.5 RFN: - 1.5 LFN: - 1.2 Unfortunately, not checked, despite being ordered   Assessment: the BMD is low according to the [facility] classification for osteoporosis (see below). Fracture risk: moderate FRAX score: 10 year major osteoporotic risk: 14.6%. 10 year hip fracture risk: 1.7%. These are under the thresholds for treatment of 20% and 3%, respectively.  Had early menopause (69-38 y/o), just like her mother.   No h/o kidney stones.  + h/o CKD. Last BUN/Cr: Lab Results  Component Value Date   BUN 18 11/27/2014   CREATININE 0.83 11/27/2014   Pt was on HCTZ,  stopped 3 years ago.  + h/o vitamin D deficiency. Last level: Component     Latest Ref Rng 07/30/2014 09/18/2014  VITD     30.00 - 100.00 ng/mL 24.57 (L) 31.66  Phosphorus     2.3 - 4.6 mg/dL  3.8   When I saw her last, she was on Calcitriol (nonformulary) 0.25 mg 3x a week - started in 2013 by Dr Loanne Drilling. She was wondering whether she needed to continue with Calcitriol as starting 10/2014, it was not covered by her insurance. We started to decrease the calcitriol and she ended up stopping calcitriol by herself. Calcium levels remained normal.  Also takes vit D 2000 units daily.  She did not increase to 3000 units as advised.  Pt is not on calcium; she does eat dairy and green, leafy, vegetables.  She also has a h/o seasonal asthma, triggered by cold weather >> Prednisone tapers >> weight gain and increased hunger.   Denies AP/constipation.  ROS: Constitutional: + weight gain, no fatigue, no subjective hyperthermia, + poor sleep Eyes: + blurry vision, no xerophthalmia ENT: no sore throat, no nodules palpated in throat, no dysphagia/odynophagia, no hoarseness Cardiovascular: no CP/SOB/palpitations/+ leg swelling (Norvasc) Respiratory: no cough/SOB/wheezing Gastrointestinal: no N/V/D/C/heartburn Musculoskeletal: + all:  muscle/joint aches Skin: + rash, + easy bruising Neurological: no tremors/numbness/tingling/dizziness  I reviewed pt's medications, allergies, PMH, social hx, family hx, and changes were documented in the history of present illness. Otherwise, unchanged  from my initial visit note:  Past Medical History  Diagnosis Date  . Anxiety   . Asthma   . Hypertension   . Arthritis   . Diverticulosis   . Low back pain   . History of shingles   . PUD (peptic ulcer disease)   . Internal hemorrhoids   . Adenomatous polyp 11/23/2005  . GERD (gastroesophageal reflux disease)   . DIVERTICULOSIS, COLON 04/22/2007         Past Surgical History  Procedure Laterality Date  .  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 Cataract surgery     History   Social History  . Marital Status: Divorced    Spouse Name: N/A    Number of Children: 2   Occupational History  . Cleaning    Social History Main Topics  . Smoking status: Former Smoker, quit 1979  . Smokeless tobacco: Never Used  . Alcohol Use: No  . Drug Use: No  . 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    Current Outpatient Prescriptions on File Prior to Visit  Medication Sig Dispense Refill  . acetaminophen (TYLENOL) 325 MG tablet Take 650 mg by mouth every 6 (six) hours as needed.    Marland Kitchen alprazolam (XANAX) 2 MG tablet TAKE 1/2 TO 1 TABLET AT BEDTIME AS NEEDED FOR SLEEP 30 tablet 3  . alprazolam (XANAX) 2 MG tablet TAKE 1/2 to ONE TABLET AT BEDTIME AS NEEDED FORSLEEP 30 tablet 5  . amLODipine-valsartan (EXFORGE) 10-320 MG per tablet Take 1 tablet by mouth daily. 30 tablet 5  . cholecalciferol (VITAMIN D) 1000 UNITS tablet Take 1,000 Units by mouth daily.    Marland Kitchen gabapentin (NEURONTIN) 100 MG capsule Take 1 capsule (100 mg total) by mouth at bedtime. 30 capsule 3  . levocetirizine (XYZAL) 5 MG tablet 1 tab every am 30 tablet 11  . omeprazole (PRILOSEC) 40 MG capsule Take 1 capsule (40 mg total) by mouth daily. 30 capsule 5  . predniSONE (DELTASONE) 50 MG tablet Take 1 tablet (50 mg total) by mouth daily with breakfast. 5 tablet 0  . propranolol (INDERAL) 40 MG tablet Take 1 tablet (40 mg total) by mouth daily. 30 tablet 11  . azithromycin (ZITHROMAX) 250 MG tablet Take 2 tabs on day 1, then 1 tab daily until finished (Patient not taking: Reported on 01/15/2015) 6 tablet 0   No current facility-administered medications on file prior to visit.   Allergies  Allergen Reactions  . Celexa [Citalopram Hydrobromide]     psychosis  . Cephalosporins     REACTION: tongue  swelling  . Clarithromycin     REACTION: blisters in mouth  . Clarithromycin   . Doxycycline Hyclate     REACTION: nausea, vomiting  . Levofloxacin     REACTION: tongue swells  . Penicillins     REACTION: per patient causes rash,hives  . Zolpidem Tartrate     REACTION: difficulty with concentration   Family History  Problem Relation Age of Onset  . Heart disease Mother   . Hypertension Mother   . Diabetes Mother   . COPD Father   . Colon cancer Neg Hx    PE: BP 108/70 mmHg  Pulse 74  Temp(Src) 98.3 F (36.8 C) (Oral)  Resp 12  Wt 180  lb 9.6 oz (81.92 kg)  SpO2 98% Wt Readings from Last 3 Encounters:  01/15/15 180 lb 9.6 oz (81.92 kg)  01/02/15 185 lb (83.915 kg)  12/28/14 185 lb (83.915 kg)   Constitutional: overweight, in NAD. No kyphosis. Eyes: PERRLA, EOMI, no exophthalmos ENT: moist mucous membranes, no thyromegaly, no cervical lymphadenopathy Cardiovascular: RRR, No MRG Respiratory: CTA B Gastrointestinal: abdomen soft, NT, ND, BS+ Musculoskeletal: no deformities, strength intact in all 4 Skin: moist, warm, no rashes Neurological: no tremor with outstretched hands, DTR normal in all 4  Assessment: 1. Normocalcemic hyperparathyroidism  2. Vit D deficiency  Plan: 1. And 2. Patient has had 1 instance of minimally elevated calcium per review of the chart, at 10.6. She had few PTH  levels that were elevated in the past, however, more recently, PTH and calcium levels were normal. - She also has a h/o vitamin D deficiency,  with last value being normal, at 61. She continues vitamin D over-the-counter 2000 units daily. She did not receive the message from me increase the dose to 3000 units.  - She was started on Calcitriol in 2013,  but she was able to come off since last visit. - No apparent complications from hypercalcemia: no h/o nephrolithiasis, no osteoporosis (right hip with a T score in the osteoporotic range per her latest DEXA scans), had 1 traumatic  fracture. No abdominal pain, depression.  - patient's labs point away from primary hyperparathyroidism, however, she had a sestamibi scan in 2013 that showed a possible parathyroid adenoma. I discussed with patient at this visit that, with her parathyroid hormone low normal and calcium levels perfectly normal, I would just follow the levels, but not intervene. She completely agrees with this. If the calcium or parathyroid hormone starts to increase, we can check the urinary calcium to further investigate this. - today I will checkcheck: calcium level intact PTH ([facility]) vitamin D- 25 HO  - I will see the patient back in 6 months  Office Visit on 01/15/2015  Component Date Value Ref Range Status  . VITD 01/15/2015 19.91* 30.00 - 100.00 ng/mL Final  . Calcium 01/15/2015 9.5  8.7 - 10.3 mg/dL Final  . PTH 01/15/2015 20  15 - 65 pg/mL Final  . PTH 01/15/2015 Comment   Final   Comment: Interpretation                 Intact PTH    Calcium                                 (pg/mL)      (mg/dL) Normal                          15 - 65     8.6 - 10.2 Primary Hyperparathyroidism         >65          >10.2 Secondary Hyperparathyroidism       >65          <10.2 Non-Parathyroid Hypercalcemia       <65          >10.2 Hypoparathyroidism                  <15          < 8.6 Non-Parathyroid Hypocalcemia    15 - 65          < 8.6  Ca and PTH are normal. Vit D low >> I will advise her to take 2x 2000 units daily.

## 2015-01-15 NOTE — Telephone Encounter (Signed)
Pt called and notified

## 2015-01-15 NOTE — Patient Instructions (Signed)
Please stop at the lab.  Please come back for a follow-up appointment in 6 months.  

## 2015-01-15 NOTE — Telephone Encounter (Signed)
I sent in a higher dose 5 day pills. If she has persistent symptoms after this, want her to come see me next week.

## 2015-01-16 LAB — PTH, INTACT AND CALCIUM
CALCIUM: 9.5 mg/dL (ref 8.7–10.3)
PTH: 20 pg/mL (ref 15–65)

## 2015-01-22 ENCOUNTER — Telehealth: Payer: Self-pay | Admitting: Internal Medicine

## 2015-01-22 NOTE — Telephone Encounter (Signed)
Called pt and left message to schedule new pt appt.    Dx: Petechiae/Purpura Referring: Dr. Amador Cunas

## 2015-01-29 ENCOUNTER — Other Ambulatory Visit (INDEPENDENT_AMBULATORY_CARE_PROVIDER_SITE_OTHER): Payer: Medicare Other

## 2015-01-29 ENCOUNTER — Ambulatory Visit (INDEPENDENT_AMBULATORY_CARE_PROVIDER_SITE_OTHER): Payer: Medicare Other | Admitting: Family Medicine

## 2015-01-29 ENCOUNTER — Encounter: Payer: Self-pay | Admitting: Family Medicine

## 2015-01-29 VITALS — BP 132/88 | HR 81 | Temp 98.5°F | Ht 61.75 in | Wt 180.0 lb

## 2015-01-29 DIAGNOSIS — Z0000 Encounter for general adult medical examination without abnormal findings: Secondary | ICD-10-CM | POA: Diagnosis not present

## 2015-01-29 DIAGNOSIS — M5441 Lumbago with sciatica, right side: Secondary | ICD-10-CM

## 2015-01-29 DIAGNOSIS — M9908 Segmental and somatic dysfunction of rib cage: Secondary | ICD-10-CM

## 2015-01-29 DIAGNOSIS — M9902 Segmental and somatic dysfunction of thoracic region: Secondary | ICD-10-CM

## 2015-01-29 DIAGNOSIS — M999 Biomechanical lesion, unspecified: Secondary | ICD-10-CM

## 2015-01-29 LAB — TSH: TSH: 1.06 u[IU]/mL (ref 0.35–4.50)

## 2015-01-29 LAB — POCT URINALYSIS DIPSTICK
BILIRUBIN UA: NEGATIVE
GLUCOSE UA: NEGATIVE
Ketones, UA: NEGATIVE
LEUKOCYTES UA: NEGATIVE
Nitrite, UA: NEGATIVE
RBC UA: NEGATIVE
Spec Grav, UA: >=1.03
UROBILINOGEN UA: 0.2
pH, UA: 5

## 2015-01-29 LAB — LIPID PANEL
CHOL/HDL RATIO: 5
Cholesterol: 140 mg/dL (ref 0–200)
HDL: 25.5 mg/dL — AB (ref >39.00)
LDL CALC: 92 mg/dL (ref 0–99)
NonHDL: 114.5
Triglycerides: 113 mg/dL (ref 0.0–149.0)
VLDL: 22.6 mg/dL (ref 0.0–40.0)

## 2015-01-29 LAB — CBC WITH DIFFERENTIAL/PLATELET
BASOS ABS: 0.1 10*3/uL (ref 0.0–0.1)
Basophils Relative: 0.5 % (ref 0.0–3.0)
EOS PCT: 2.6 % (ref 0.0–5.0)
Eosinophils Absolute: 0.3 10*3/uL (ref 0.0–0.7)
HEMATOCRIT: 37.1 % (ref 36.0–46.0)
Hemoglobin: 12.5 g/dL (ref 12.0–15.0)
LYMPHS ABS: 1.9 10*3/uL (ref 0.7–4.0)
LYMPHS PCT: 17.2 % (ref 12.0–46.0)
MCHC: 33.6 g/dL (ref 30.0–36.0)
MCV: 86.3 fl (ref 78.0–100.0)
MONOS PCT: 8 % (ref 3.0–12.0)
Monocytes Absolute: 0.9 10*3/uL (ref 0.1–1.0)
NEUTROS PCT: 71.7 % (ref 43.0–77.0)
Neutro Abs: 7.9 10*3/uL — ABNORMAL HIGH (ref 1.4–7.7)
Platelets: 297 10*3/uL (ref 150.0–400.0)
RBC: 4.3 Mil/uL (ref 3.87–5.11)
RDW: 13.3 % (ref 11.5–15.5)
WBC: 11.1 10*3/uL — AB (ref 4.0–10.5)

## 2015-01-29 LAB — BASIC METABOLIC PANEL
BUN: 16 mg/dL (ref 6–23)
CHLORIDE: 103 meq/L (ref 96–112)
CO2: 29 meq/L (ref 19–32)
Calcium: 9.5 mg/dL (ref 8.4–10.5)
Creatinine, Ser: 0.83 mg/dL (ref 0.40–1.20)
GFR: 72.53 mL/min (ref >60.00)
GLUCOSE: 209 mg/dL — AB (ref 70–99)
POTASSIUM: 4.1 meq/L (ref 3.5–5.1)
SODIUM: 138 meq/L (ref 135–145)

## 2015-01-29 LAB — HEPATIC FUNCTION PANEL
ALBUMIN: 3.6 g/dL (ref 3.5–5.2)
ALK PHOS: 95 U/L (ref 39–117)
ALT: 17 U/L (ref 0–35)
AST: 15 U/L (ref 0–37)
Bilirubin, Direct: 0.1 mg/dL (ref 0.0–0.3)
TOTAL PROTEIN: 7 g/dL (ref 6.0–8.3)
Total Bilirubin: 0.3 mg/dL (ref 0.2–1.2)

## 2015-01-29 NOTE — Progress Notes (Signed)
  [name] [place], [place] [address] Phone: [phone] Subjective:     CC: Right arm pain as well as baclk pain   [id number] Felicia Perry is a 69 y.o. female coming in with complaint of right arm pain.  Patient was seen previously and does have cervical disc disorder as well as a calcific rotator cuff tendinopathy. At last visit patient did have osteopathic manipulation. She has been doing very well but has had pain overall. Patient had been doing well but then she has recently been sick. Patient has had 2 different rounds of prednisone recently. Patient is off the prednisone now and is having significant fatigue. Patient states that her breathing has improved but very slowly. Patient states that she just does not have any type of energy. Patient states that the pain overall though is not significantly worse in her main areas but does have diffusely uncomfortable joint she states. She has not been able to work secondary to her being fatigued.    Past medical history, social, surgical and family history all reviewed in electronic medical record.   Review of Systems: No headache, visual changes, nausea, vomiting, diarrhea, constipation, dizziness, abdominal pain, skin rash, fevers, chills, night sweats, weight loss, swollen lymph nodes, body aches, joint swelling, muscle aches, chest pain, shortness of breath, mood changes.   Objective Blood pressure 132/88, pulse 81, temperature 98.5 F (36.9 C), temperature source Oral, height 5' 1.75" (1.568 m), weight 180 lb (81.647 kg), SpO2 97 %.  General: No apparent distress alert and oriented x3 mood and affect normal, dressed appropriately.  HEENT: Pupils equal, extraocular movements intact  Respiratory: Patient does have some shallow breathing as well as some expiratory wheezing Cardiovascular: No lower extremity edema, non tender, no erythema  Skin: Warm dry intact with no signs of infection or  rash on extremities or on axial skeleton.  Abdomen: Soft nontender  Neuro: Cranial nerves II through XII are intact, neurovascularly intact in all extremities with 2+ DTRs and 2+ pulses.  Lymph: No lymphadenopathy of posterior or anterior cervical chain or axillae bilaterally.  Gait normal with good balance and coordination.  MSK:  Non tender with full range of motion and good stability and symmetric strength and tone of elbows, wrist, hip and ankles bilaterally.  Neck: Inspection unremarkable. No palpable stepoffs. Negative Spurling's maneuver. Full neck range of motion Grip strength and sensation normal in bilateral hands Strength good C4 to T1 distribution No sensory change to C4 to T1 Negative Hoffman sign bilaterally Reflexes normal  Osteopathic findings of   Thoracic T2 extended rotated and side bent right inhaled second rib T5 extended rotated and side bent left  Lumbar L2 flexed rotated and side bent right   Impression and Recommendations:     This case required medical decision making of moderate complexity.

## 2015-01-29 NOTE — Assessment & Plan Note (Signed)
Decision today to treat with OMT was based on Physical Exam  After verbal consent patient was treated with HVLA, ME techniques in thoracic and rib areas  Patient tolerated the procedure well with improvement in symptoms  Patient given exercises, stretches and lifestyle modifications  See medications in patient instructions if given  Patient will follow up in 3-4 weeks

## 2015-01-29 NOTE — Progress Notes (Signed)
Pre visit review using our clinic review tool, if applicable. No additional management support is needed unless otherwise documented below in the visit note. 

## 2015-01-29 NOTE — Assessment & Plan Note (Signed)
Patient does have some low back pain but also be thoracic back pain that is giving her the difficulty. Patient has responded very well to osteopathic manipulation previously. We did discuss that some of this can be secondary to residual swelling from patient's recent illness. We discussed over-the-counter medicines to take for short course. We also discussed icing regimen and continuing the home exercises and more postural changes. Patient will come back and see me again in 2-3 weeks.

## 2015-01-29 NOTE — Patient Instructions (Signed)
Good to see you DHEA 50mg  daily for next 3 weeks to jump start the adrenal glands. Take in AM with food. Or 7-Keto 100mg  daily.  Symbicort 2 times daily for next week to keep you breathing.  Continue exercises if you can.  See me again in 2 weeks.

## 2015-01-31 ENCOUNTER — Encounter: Payer: Self-pay | Admitting: Oncology

## 2015-01-31 NOTE — Progress Notes (Signed)
I called to let the patient know if any prior auth needed prior to her coming we would be responsible for getting it. She just needs to have her insurance card and copay when she comes in May.

## 2015-02-08 ENCOUNTER — Encounter: Payer: Self-pay | Admitting: Family Medicine

## 2015-02-08 ENCOUNTER — Ambulatory Visit (INDEPENDENT_AMBULATORY_CARE_PROVIDER_SITE_OTHER): Payer: Medicare Other | Admitting: Family Medicine

## 2015-02-08 VITALS — BP 140/82 | HR 65 | Temp 98.2°F | Wt 187.0 lb

## 2015-02-08 DIAGNOSIS — Z0000 Encounter for general adult medical examination without abnormal findings: Secondary | ICD-10-CM

## 2015-02-08 DIAGNOSIS — R739 Hyperglycemia, unspecified: Secondary | ICD-10-CM

## 2015-02-08 DIAGNOSIS — Z23 Encounter for immunization: Secondary | ICD-10-CM

## 2015-02-08 LAB — HEMOGLOBIN A1C
Hgb A1c MFr Bld: 7.4 % — ABNORMAL HIGH (ref <5.7)
Mean Plasma Glucose: 166 mg/dL — ABNORMAL HIGH (ref <117)

## 2015-02-08 NOTE — Progress Notes (Signed)
Garret Reddish, MD Phone: [phone]  Subjective:  Patient presents today for their annual physical. Chief complaint-noted.   Patient is doing much better today than prior visits. Her edema has improved-states improved while on prednisone and slightly worse after that time. Her chronic sinusitis finally cleared. CBG elevated on labs and agreeable to a1c. She does state before last labs she ate a pint of ice cream the night prior. BP medicines are tolerable-we are still concerned about amlodipine potentially cause edema. She is doing a much better job with exercise but obviously as above still struggling with food choices. PHq2 0 today. She has been using symbicort recently under guidance of Dr. Tamala Julian.  She has upcoming visit with oncology soon for petechial rash but we discussed potential derm referral as well.   ROS- full  review of systems was completed and negative except for: trouble sleeping at times resolved with insomnia, no shortness of breath or wheeze on symbicort.   The following were reviewed and entered/updated in epic: Past Medical History  Diagnosis Date  . Anxiety   . Asthma   . Hypertension   . Arthritis   . Diverticulosis   . Low back pain   . History of shingles   . PUD (peptic ulcer disease)   . Internal hemorrhoids   . Adenomatous polyp 11/23/2005  . GERD (gastroesophageal reflux disease)   . DIVERTICULOSIS, COLON 04/22/2007         Patient Active Problem List   Diagnosis Date Noted  . Hyperparathyroidism 09/19/2014    Priority: Medium  . Insomnia 07/13/2014    Priority: Medium  . Depression 04/22/2007    Priority: Medium  . Essential hypertension 04/22/2007    Priority: Medium  . ASTHMA 04/22/2007    Priority: Medium  . Limited joint range of motion 07/13/2014    Priority: Low  . Rash 07/13/2014    Priority: Low  . GERD (gastroesophageal reflux disease) 02/25/2011    Priority: Low  . CERVICAL DISC DISORDER 10/10/2007    Priority: Low  . LOW BACK  PAIN 07/01/2007    Priority: Low  . Difficulty sleeping 12/04/2014  . Nonallopathic lesion of lumbosacral region 11/13/2014  . Osteoarthritis of left lower extremity 10/17/2014  . Chronic meniscal tear of knee 10/17/2014  . Nonallopathic lesion of thoracic region 09/14/2014  . Nonallopathic lesion-rib cage 08/21/2014  . Tendinopathy of right rotator cuff 07/30/2014  . Piriformis syndrome of right side 07/30/2014   Past Surgical History  Procedure Laterality Date  . 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 Cataract surgery      Family History  Problem Relation Age of Onset  . Heart disease Mother   . Hypertension Mother   . Diabetes Mother   . COPD Father   . Colon cancer Neg Hx     Medications- reviewed and updated Current Outpatient Prescriptions  Medication Sig Dispense Refill  . amLODipine-valsartan (EXFORGE) 10-320 MG per tablet Take 1 tablet by mouth daily. 30 tablet 5  . cholecalciferol (VITAMIN D) 1000 UNITS tablet Take 1,000 Units by mouth daily.    Marland Kitchen gabapentin (NEURONTIN) 100 MG capsule Take 1 capsule (100 mg total) by mouth at bedtime. 30 capsule 3  . levocetirizine (XYZAL) 5 MG tablet 1 tab every am 30 tablet 11  . omeprazole (PRILOSEC) 40 MG capsule Take 1 capsule (40 mg total) by mouth daily. 30 capsule 5  . propranolol (INDERAL) 40 MG tablet Take 1 tablet (  40 mg total) by mouth daily. 30 tablet 11  . acetaminophen (TYLENOL) 325 MG tablet Take 650 mg by mouth every 6 (six) hours as needed.     No current facility-administered medications for this visit.    Allergies-reviewed and updated Allergies  Allergen Reactions  . Celexa [Citalopram Hydrobromide]     psychosis  . Cephalosporins     REACTION: tongue swelling  . Clarithromycin     REACTION: blisters in mouth  . Clarithromycin   . Doxycycline Hyclate     REACTION: nausea, vomiting  . Levofloxacin     REACTION: tongue swells  . Penicillins     REACTION: per patient causes  rash,hives  . Zolpidem Tartrate     REACTION: difficulty with concentration    History   Social History  . Marital Status: Divorced    Spouse Name: N/A  . Number of Children: N/A  . Years of Education: N/A   Occupational History  . Cleaning    Social History Main Topics  . Smoking status: Former Research scientist (life sciences)  . Smokeless tobacco: Never Used  . Alcohol Use: No  . Drug Use: No  . Sexual Activity: Yes   Other Topics Concern  . None   Social History Narrative   Divorced for 38 years in 2016. 2 sons.  2 granddaughters.       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       Hobbies: watch tv-survivor, dancing with the stars, enjoy alone time. Best friend [name] patient of Dr. Yong Channel. Enjoys work, time on Teaching laboratory technician.     ROS--See HPI   Objective: BP 140/82 mmHg  Pulse 65  Temp(Src) 98.2 F (36.8 C)  Wt 187 lb (84.823 kg) Gen: NAD, resting comfortably HEENT: Mucous membranes are moist. Oropharynx normal Neck: no thyromegaly CV: RRR no murmurs rubs or gallops Breasts: normal appearance, no masses or tenderness Lungs: CTAB no crackles, wheeze, rhonchi Abdomen: soft/nontender/nondistended/normal bowel sounds. No rebound or guarding.  Ext: 1+ edema (slightly improved) Skin: warm, dry, petechial rash over bilateral inner ankles less severe than previous Neuro: grossly normal, moves all extremities, PERRLA   Assessment/Plan:  69 y.o. female presenting for annual physical.  Health Maintenance counseling: 1. Anticipatory guidance: Patient counseled regarding regular dental exams, wearing seatbelts, wearing sunscreen 2. Risk factor reduction:  Advised patient of need for regular exercise (joined gym and going twice a week, using balance ball everyday) and diet rich and fruits and vegetables to reduce risk of heart attack and stroke.  3. Immunizations/screenings/ancillary studies- up to date with pneumovax today 4. Check a1c with  hyperglycemia. Suspect at risk range.   6 months.   Orders Placed This Encounter  Procedures  . Pneumococcal polysaccharide vaccine 23-valent greater than or equal to 2yo subcutaneous/IM  . Hemoglobin A1c

## 2015-02-08 NOTE — Patient Instructions (Addendum)
Received final pneumonia shot today ([id number]).  Check a1c in lab before you leave  I am glad you are doing better and finally got over that illness!  Let's check in 6 months from now (or sooner if you need me)  BP just a hair high but at goal per current guidelines called [facility]

## 2015-02-12 ENCOUNTER — Ambulatory Visit (INDEPENDENT_AMBULATORY_CARE_PROVIDER_SITE_OTHER): Payer: Medicare Other | Admitting: Family Medicine

## 2015-02-12 ENCOUNTER — Encounter: Payer: Self-pay | Admitting: Family Medicine

## 2015-02-12 VITALS — BP 132/82 | HR 81 | Ht 61.75 in | Wt 185.0 lb

## 2015-02-12 DIAGNOSIS — M5090 Cervical disc disorder, unspecified, unspecified cervical region: Secondary | ICD-10-CM | POA: Diagnosis not present

## 2015-02-12 DIAGNOSIS — M2550 Pain in unspecified joint: Secondary | ICD-10-CM | POA: Diagnosis not present

## 2015-02-12 DIAGNOSIS — M9908 Segmental and somatic dysfunction of rib cage: Secondary | ICD-10-CM | POA: Diagnosis not present

## 2015-02-12 DIAGNOSIS — M9903 Segmental and somatic dysfunction of lumbar region: Secondary | ICD-10-CM

## 2015-02-12 DIAGNOSIS — M9902 Segmental and somatic dysfunction of thoracic region: Secondary | ICD-10-CM | POA: Diagnosis not present

## 2015-02-12 DIAGNOSIS — M999 Biomechanical lesion, unspecified: Secondary | ICD-10-CM

## 2015-02-12 NOTE — Assessment & Plan Note (Signed)
Decision today to treat with OMT was based on Physical Exam  After verbal consent patient was treated with HVLA, ME techniques in thoracic and rib areas  Patient tolerated the procedure well with improvement in symptoms  Patient given exercises, stretches and lifestyle modifications  See medications in patient instructions if given  Patient will follow up in 3-4 weeks

## 2015-02-12 NOTE — Progress Notes (Signed)
  [name] [place], [place] [address] Phone: [phone] Subjective:     CC: Right arm pain as well as baclk pain   [id number] Felicia Perry is a 69 y.o. female coming in with complaint of right arm pain.  Patient was seen previously and does have cervical disc disorder as well as a calcific rotator cuff tendinopathy. Patient has been really responding very well to conservative therapy. We discussed icing regimen and home exercises. Patient has been doing this on a very regular basis.  Patient was having significant fatigue as well as weight gain. Patient's labs will have been unremarkable for thyroid disease. Patient did start on DHEA and did notices significant improvement where she has been able to work full days now. Patient is happy with those results but continues to have the back pain as well as multiple joint pains. Strong family history of gout.  Has started going to gym on a regular basis.  Past medical history, social, surgical and family history all reviewed in electronic medical record.   Review of Systems: No headache, visual changes, nausea, vomiting, diarrhea, constipation, dizziness, abdominal pain, skin rash, fevers, chills, night sweats, weight loss, swollen lymph nodes, body aches, joint swelling, muscle aches, chest pain, shortness of breath, mood changes.   Objective Blood pressure 132/82, pulse 81, height 5' 1.75" (1.568 m), weight 185 lb (83.915 kg), SpO2 97 %.  General: No apparent distress alert and oriented x3 mood and affect normal, dressed appropriately.  HEENT: Pupils equal, extraocular movements intact  Respiratory: Patient does have some shallow breathing as well as some expiratory wheezing Cardiovascular: No lower extremity edema, non tender, no erythema  Skin: Warm dry intact with no signs of infection or rash on extremities or on axial skeleton.  Abdomen: Soft nontender  Neuro: Cranial nerves II through XII  are intact, neurovascularly intact in all extremities with 2+ DTRs and 2+ pulses.  Lymph: No lymphadenopathy of posterior or anterior cervical chain or axillae bilaterally.  Gait normal with good balance and coordination.  MSK:  Non tender with full range of motion and good stability and symmetric strength and tone of elbows, wrist, hip and ankles bilaterally. Mild osteophytic changes of multiple joints.  Osteopathic findings of   Thoracic T2 extended rotated and side bent right inhaled second rib T5 extended rotated and side bent left  Lumbar L2 flexed rotated and side bent right  No change from previous exam   Impression and Recommendations:     This case required medical decision making of moderate complexity.

## 2015-02-12 NOTE — Assessment & Plan Note (Signed)
Patient continues to respond very well to the cervical manipulation as well as thoracic medication. Encourage patient to continue with the postural changes. 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 Discussed the possibility of patient having uric acid elevation that could also be contributing to some of her multiple joint pain. Patient elected did not have this checked today. Patient is to see a hematologist for possible platelets disorder but she is thinking she will canceled this. Patient has had elevated calcium previously but is unremarkable with recent lab tests. Patient will then come back again in 3 weeks for further evaluation and treatment.

## 2015-02-12 NOTE — Progress Notes (Signed)
Pre visit review using our clinic review tool, if applicable. No additional management support is needed unless otherwise documented below in the visit note. 

## 2015-02-12 NOTE — Patient Instructions (Addendum)
Good to see you and sorry I am running late Ice is your friend We do need to check your uric acid at some point.  Continue the vitamins and the DHEA 2 glasses of water right when you wake up Eat every 2-3 hours such as a yogurt, nuts, energy bar etc. See me again in 3 weeks.      Diabetes Mellitus and Food It is important for you to manage your blood sugar (glucose) level. Your blood glucose level can be greatly affected by what you eat. Eating healthier foods in the appropriate amounts throughout the day at about the same time each day will help you control your blood glucose level. It can also help slow or prevent worsening of your diabetes mellitus. Healthy eating may even help you improve the level of your blood pressure and reach or maintain a healthy weight.  HOW CAN FOOD AFFECT ME? Carbohydrates Carbohydrates affect your blood glucose level more than any other type of food. Your dietitian will help you determine how many carbohydrates to eat at each meal and teach you how to count carbohydrates. Counting carbohydrates is important to keep your blood glucose at a healthy level, especially if you are using insulin or taking certain medicines for diabetes mellitus. Alcohol Alcohol can cause sudden decreases in blood glucose (hypoglycemia), especially if you use insulin or take certain medicines for diabetes mellitus. Hypoglycemia can be a life-threatening condition. Symptoms of hypoglycemia (sleepiness, dizziness, and disorientation) are similar to symptoms of having too much alcohol.  If your health care provider has given you approval to drink alcohol, do so in moderation and use the following guidelines:  Women should not have more than one drink per day, and men should not have more than two drinks per day. One drink is equal to:  12 oz of beer.  5 oz of wine.  1 oz of hard liquor.  Do not drink on an empty stomach.  Keep yourself hydrated. Have water, diet soda, or unsweetened  iced tea.  Regular soda, juice, and other mixers might contain a lot of carbohydrates and should be counted. WHAT FOODS ARE NOT RECOMMENDED? As you make food choices, it is important to remember that all foods are not the same. Some foods have fewer nutrients per serving than other foods, even though they might have the same number of calories or carbohydrates. It is difficult to get your body what it needs when you eat foods with fewer nutrients. Examples of foods that you should avoid that are high in calories and carbohydrates but low in nutrients include:  Trans fats (most processed foods list trans fats on the Nutrition Facts label).  Regular soda.  Juice.  Candy.  Sweets, such as cake, pie, doughnuts, and cookies.  Fried foods. WHAT FOODS CAN I EAT? Have nutrient-rich foods, which will nourish your body and keep you healthy. The food you should eat also will depend on several factors, including:  The calories you need.  The medicines you take.  Your weight.  Your blood glucose level.  Your blood pressure level.  Your cholesterol level. You also should eat a variety of foods, including:  Protein, such as meat, poultry, fish, tofu, nuts, and seeds (lean animal proteins are best).  Fruits.  Vegetables.  Dairy products, such as milk, cheese, and yogurt (low fat is best).  Breads, grains, pasta, cereal, rice, and beans.  Fats such as olive oil, trans fat-free margarine, canola oil, avocado, and olives. DOES EVERYONE WITH DIABETES  MELLITUS HAVE THE SAME MEAL PLAN? Because every person with diabetes mellitus is different, there is not one meal plan that works for everyone. It is very important that you meet with a dietitian who will help you create a meal plan that is just right for you. Document Released: 07/09/2005 Document Revised: 10/17/2013 Document Reviewed: 09/08/2013 [employer] Patient Information 2015 [place], [place]. This information is not intended to replace  advice given to you by your health care provider. Make sure you discuss any questions you have with your health care provider.

## 2015-03-05 ENCOUNTER — Other Ambulatory Visit: Payer: Medicare Other

## 2015-03-05 ENCOUNTER — Encounter: Payer: Self-pay | Admitting: Family Medicine

## 2015-03-05 ENCOUNTER — Ambulatory Visit (HOSPITAL_BASED_OUTPATIENT_CLINIC_OR_DEPARTMENT_OTHER): Payer: Medicare Other | Admitting: Oncology

## 2015-03-05 ENCOUNTER — Ambulatory Visit: Payer: Medicare Other

## 2015-03-05 ENCOUNTER — Ambulatory Visit (INDEPENDENT_AMBULATORY_CARE_PROVIDER_SITE_OTHER): Payer: Medicare Other | Admitting: Family Medicine

## 2015-03-05 ENCOUNTER — Ambulatory Visit: Payer: Medicare Other | Admitting: Family Medicine

## 2015-03-05 ENCOUNTER — Encounter: Payer: Self-pay | Admitting: Oncology

## 2015-03-05 VITALS — BP 130/80 | HR 78 | Ht 61.75 in | Wt 185.0 lb

## 2015-03-05 VITALS — BP 149/83 | HR 82 | Temp 98.1°F | Resp 18 | Wt 186.2 lb

## 2015-03-05 DIAGNOSIS — R600 Localized edema: Secondary | ICD-10-CM | POA: Diagnosis not present

## 2015-03-05 DIAGNOSIS — R233 Spontaneous ecchymoses: Secondary | ICD-10-CM

## 2015-03-05 DIAGNOSIS — M5090 Cervical disc disorder, unspecified, unspecified cervical region: Secondary | ICD-10-CM | POA: Diagnosis not present

## 2015-03-05 DIAGNOSIS — L539 Erythematous condition, unspecified: Secondary | ICD-10-CM

## 2015-03-05 DIAGNOSIS — R894 Abnormal immunological findings in specimens from other organs, systems and tissues: Secondary | ICD-10-CM

## 2015-03-05 DIAGNOSIS — M999 Biomechanical lesion, unspecified: Secondary | ICD-10-CM

## 2015-03-05 DIAGNOSIS — M9902 Segmental and somatic dysfunction of thoracic region: Secondary | ICD-10-CM | POA: Diagnosis not present

## 2015-03-05 DIAGNOSIS — R21 Rash and other nonspecific skin eruption: Secondary | ICD-10-CM

## 2015-03-05 NOTE — Assessment & Plan Note (Signed)
Continues to respond fairly well to conservative therapy including osteopathic manipulation. Patient will continue with the natural supplementations. Discussed with patient to increase her vitamin D supplementation as to 2000 units daily. Patient vitamin D was very low in this could be conjoining to some of her aches and pains. Patient does have a positive ANA and they're going to recheck some labs in 6 months to further evaluate. Patient may be looking at potentially referral to a rheumatologist and we'll discuss with primary care provider. But do one patient did check her uric acid levels as well extend she is having a blood draw. Order already placed. Patient will come back and see me again in 3-4 weeks as patient continues with the conservative therapy home exercises, icing and the medications we discussed previously.

## 2015-03-05 NOTE — Progress Notes (Signed)
Checked in new pt with no financial concerns. °

## 2015-03-05 NOTE — Consult Note (Signed)
Reason for Referral: Evaluation for a platelet disorder.   HPI: 69 year old woman currently of [place] where she lived the majority of her life. She has history of obesity, hypertension and osteoarthritis. 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 For the last 1-2 years she has had intermittent upper respiratory infections including bronchitis as well as sinusitis and have required prednisone treatments on a few different occasions. She was also noted to have this lower extremity rash that have been noted intermittently. She noticed that specifically after a long day and that she reports lower extremity edema with it. It is described as erythematous band just above the ankle bilaterally. He does not have it today. She did show me pictures of it and not associated with any pruritus or pain. She also noted considerable thinning of her skin and easy bruising in her upper extremities. She also has had some joint pain that is relieved by prednisone and relapses after she has been weaned off prednisone.  She does not report any bleeding history. She had extensive dental work up occluding teeth extraction and root canal without any post operative bleeding. She gave birth to 2 times normal without any postpartum bleeding. She had cataract surgery years ago and was uncomplicated. She does not report any epistaxis, hematochezia or melena. She is up-to-date on her age-appropriate cancer screening.  She does report diffuse joint pain including upper and lower extremities.  She does not report any headaches, blurry vision, syncope or seizures. She does not report any fevers, chills, sweats or weight loss. Has not reported any chest pain, palpitation orthopnea but does report leg edema. She does not report any hemoptysis or wheezing. She does not report any nausea, vomiting, abdominal pain, hematochezia, melena. She does not report any frequency, urgency or hesitancy. She does not  report any hematuria or dysuria. Remaining review of systems unremarkable.   Past Medical History  Diagnosis Date  . Anxiety   . Asthma   . Hypertension   . Arthritis   . Diverticulosis   . Low back pain   . History of shingles   . PUD (peptic ulcer disease)   . Internal hemorrhoids   . Adenomatous polyp 11/23/2005  . GERD (gastroesophageal reflux disease)   . DIVERTICULOSIS, COLON 04/22/2007        :  Past Surgical History  Procedure Laterality Date  . 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 Cataract surgery    :   Current outpatient prescriptions:  .  acetaminophen (TYLENOL) 325 MG tablet, Take 650 mg by mouth every 6 (six) hours as needed., Disp: , Rfl:  .  alprazolam (XANAX) 2 MG tablet, Take 1 mg by mouth at bedtime as needed for sleep., Disp: , Rfl:  .  amLODipine-valsartan (EXFORGE) 10-320 MG per tablet, Take 1 tablet by mouth daily., Disp: 30 tablet, Rfl: 5 .  budesonide-formoterol (SYMBICORT) 160-4.5 MCG/ACT inhaler, Inhale 2 puffs into the lungs 2 (two) times daily., Disp: , Rfl:  .  cholecalciferol (VITAMIN D) 1000 UNITS tablet, Take 1,000 Units by mouth daily., Disp: , Rfl:  .  DHEA 25 MG CAPS, Take 1 capsule by mouth daily., Disp: , Rfl:  .  gabapentin (NEURONTIN) 100 MG capsule, Take 1 capsule (100 mg total) by mouth at bedtime., Disp: 30 capsule, Rfl: 3 .  levocetirizine (XYZAL) 5 MG tablet, 1 tab every am, Disp: 30 tablet, Rfl: 11 .  omeprazole (PRILOSEC) 40 MG capsule, Take 1 capsule (40 mg total) by mouth daily., Disp: 30 capsule, Rfl: 5 .  propranolol (INDERAL) 40 MG tablet, Take 1 tablet (40 mg total) by mouth daily., Disp: 30 tablet, Rfl: 11 .  Turmeric 500 MG CAPS, Take 500 mg by mouth daily., Disp: , Rfl: :  Allergies  Allergen Reactions  . Celexa [Citalopram Hydrobromide]     psychosis  . Cephalosporins     REACTION: tongue swelling  . Clarithromycin     REACTION: blisters in mouth  . Clarithromycin   . Doxycycline Hyclate      REACTION: nausea, vomiting  . Levofloxacin     REACTION: tongue swells  . Penicillins     REACTION: per patient causes rash,hives  . Zolpidem Tartrate     REACTION: difficulty with concentration  :  Family History  Problem Relation Age of Onset  . Heart disease Mother   . Hypertension Mother   . Diabetes Mother   . COPD Father   . Colon cancer Neg Hx   :  History   Social History  . Marital Status: Divorced    Spouse Name: N/A  . Number of Children: N/A  . Years of Education: N/A   Occupational History  . Cleaning    Social History Main Topics  . Smoking status: Former Research scientist (life sciences)  . Smokeless tobacco: Never Used  . Alcohol Use: No  . Drug Use: No  . Sexual Activity: Yes   Other Topics Concern  . Not on file   Social History Narrative   Divorced for 38 years in 2016. 2 sons.  2 granddaughters.       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       Hobbies: watch tv-survivor, dancing with the stars, enjoy alone time. Best friend [name] patient of Dr. Yong Channel. Enjoys work, time on Teaching laboratory technician.   :  Pertinent items are noted in HPI.  Exam: Blood pressure 149/83, pulse 82, temperature 98.1 F (36.7 C), temperature source Oral, resp. rate 18, weight 186 lb 3 oz (84.454 kg), SpO2 100 %. General appearance: alert and cooperative Head: Normocephalic, without obvious abnormality Throat: lips, mucosa, and tongue normal; teeth and gums normal Neck: no adenopathy Back: negative Resp: clear to auscultation bilaterally Chest wall: no tenderness Cardio: regular rate and rhythm, S1, S2 normal, no murmur, click, rub or gallop GI: soft, non-tender; bowel sounds normal; no masses,  no organomegaly Extremities: extremities normal, atraumatic, no cyanosis or edema Pulses: 2+ and symmetric Skin: Skin color, texture, turgor normal. No rashes or lesions Lymph nodes: Cervical, supraclavicular, and axillary nodes normal.  Skin: I cannot  appreciate any petechiae, bruising or ecchymosis.   CBC    Component Value Date/Time   WBC 11.1* 01/29/2015 0807   RBC 4.30 01/29/2015 0807   HGB 12.5 01/29/2015 0807   HCT 37.1 01/29/2015 0807   PLT 297.0 01/29/2015 0807   MCV 86.3 01/29/2015 0807   MCH 30.7 08/25/2013 1526   MCHC 33.6 01/29/2015 0807   RDW 13.3 01/29/2015 0807   LYMPHSABS 1.9 01/29/2015 0807   MONOABS 0.9 01/29/2015 0807   EOSABS 0.3 01/29/2015 0807   BASOSABS 0.1 01/29/2015 0807   Results for BRYANNE, RIQUELME (MRN [id number]) as of 03/05/2015 14:04  Ref. Range 11/27/2014 10:41  Prothrombin Time Latest Ref Range: 9.6-13.1 sec 11.0  INR Latest Ref Range: 0.8-1.0 ratio 1.0  APTT Latest Ref Range: 23.4-32.7 SEC 29.3  Assessment and Plan:    69 year old woman with the following issues:  1. Evaluation for bruising and possible petechiae: She describes a abnormal appearing band like erythema on her lower extremities especially after a long day of work and been on her feet associated with edema. She does not have any clear-cut evidence of petechiae, bruising or ecchymosis anywhere on her skin examination.  Her platelet counts are perfectly normal with normal coagulation parameters. Her bleeding history is unremarkable. She has had multiple dental procedures as well as normal surgery without any postoperative bleeding.  The likelihood that she has a hematological disorder or bleeding disorder is extremely unlikely. Her history is not consistent with a platelet function disorder at this time.  I see no need for any further hematological evaluation at this time.  2. Skin rash: I recommended that dermatology evaluation and she agreed. I will make a dermatology referral for a possible biopsy of that rash when it develops again.  3. Elevated ANA associated with joint pain and possible rash. She might need an evaluation from rheumatology and I will ask Dr. Yong Channel to refer her per his discretion.  All her questions were  answered and I'll be happy to see her in the future as needed.

## 2015-03-05 NOTE — Patient Instructions (Signed)
I think the deep tissue is great Continue to monitor the joint pain Continue to work on posture and balance Best I have seen you in a while Continue the vitamins See me again in 3-4 weeks.

## 2015-03-05 NOTE — Assessment & Plan Note (Signed)
Decision today to treat with OMT was based on Physical Exam  After verbal consent patient was treated with HVLA, ME techniques in thoracic and rib areas  Patient tolerated the procedure well with improvement in symptoms  Patient given exercises, stretches and lifestyle modifications  See medications in patient instructions if given  Patient will follow up in 3-4 weeks

## 2015-03-05 NOTE — Progress Notes (Signed)
Pre visit review using our clinic review tool, if applicable. No additional management support is needed unless otherwise documented below in the visit note. 

## 2015-03-05 NOTE — Progress Notes (Signed)
Please see consult note.  

## 2015-03-05 NOTE — Progress Notes (Signed)
  [name] [place], [place] [address] Phone: [phone] Subjective:     CC: back pain follow up.   [id number] Felicia Perry is a 69 y.o. female coming in with complaint of right arm pain.  Patient was seen previously and does have cervical disc disorder as well as a calcific rotator cuff tendinopathy. Patient has been really responding very well to conservative therapy. Continued back pain as well.  Been doing exercises and increasing activity. Patient is able to do all daily activities and is going to deep massage which is also helpful. Past medical history, social, surgical and family history all reviewed in electronic medical record.   History of _+ANA  Review of Systems: No headache, visual changes, nausea, vomiting, diarrhea, constipation, dizziness, abdominal pain, skin rash, fevers, chills, night sweats, weight loss, swollen lymph nodes, body aches, joint swelling, muscle aches, chest pain, shortness of breath, mood changes.   Objective Blood pressure 130/80, pulse 78, height 5' 1.75" (1.568 m), weight 185 lb (83.915 kg), SpO2 96 %.  General: No apparent distress alert and oriented x3 mood and affect normal, dressed appropriately.  HEENT: Pupils equal, extraocular movements intact  Respiratory: Patient does have some shallow breathing as well as some expiratory wheezing Cardiovascular: No lower extremity edema, non tender, no erythema  Skin: Warm dry intact with no signs of infection or rash on extremities or on axial skeleton.  Abdomen: Soft nontender  Neuro: Cranial nerves II through XII are intact, neurovascularly intact in all extremities with 2+ DTRs and 2+ pulses.  Lymph: No lymphadenopathy of posterior or anterior cervical chain or axillae bilaterally.  Gait normal with good balance and coordination.  MSK:  Non tender with full range of motion and good stability and symmetric strength and tone of elbows, wrist, hip and  ankles bilaterally. Mild osteophytic changes of multiple joints.    Osteopathic findings of  Cervical C2 flexed rotated and side bent right  Thoracic T2 extended rotated and side bent right inhaled second rib T5 extended rotated and side bent left  Lumbar L2 flexed rotated and side bent right  Increased tightness of the cervical spine from previous exam.   Impression and Recommendations:     This case required medical decision making of moderate complexity.

## 2015-03-07 ENCOUNTER — Ambulatory Visit (HOSPITAL_BASED_OUTPATIENT_CLINIC_OR_DEPARTMENT_OTHER): Payer: Medicare Other | Attending: Family Medicine | Admitting: Radiology

## 2015-03-07 VITALS — Ht 62.0 in | Wt 180.0 lb

## 2015-03-07 DIAGNOSIS — R0689 Other abnormalities of breathing: Secondary | ICD-10-CM | POA: Diagnosis not present

## 2015-03-12 DIAGNOSIS — G4733 Obstructive sleep apnea (adult) (pediatric): Secondary | ICD-10-CM | POA: Diagnosis not present

## 2015-03-12 NOTE — Sleep Study (Signed)
   NAME: Felicia Perry DATE OF BIRTH:  [birth year] MEDICAL RECORD NUMBER [id number]  LOCATION: [facility]  PHYSICIAN: [place] OF STUDY: 03/07/2015  SLEEP STUDY TYPE: Nocturnal Polysomnogram               REFERRING PHYSICIAN: [name], DO  INDICATION FOR STUDY: Hypersomnia with sleep apnea  EPWORTH SLEEPINESS SCORE:  6 HEIGHT: 5\' 2"  (157.5 cm)  WEIGHT: 180 lb (81.647 kg)    Body mass index is 32.91 kg/(m^2).  NECK SIZE: 15 in.  MEDICATIONS: Reviewed in the sleep record  SLEEP ARCHITECTURE: The patient had a total sleep time of 312 minutes, with no slow-wave sleep and 75 minutes of REM. Sleep onset latency was normal at 27 minutes, and REM onset was normal at 95 minutes. Sleep efficiency was moderately reduced at 73%.  RESPIRATORY DATA: The patient was found to have 7 apneas and 93 obstructive hypopneas, giving her an AHI of 19 events per hour. Events occurred in all body positions, but were increased during REM. There was loud snoring noted throughout.  OXYGEN DATA: There was transient oxygen desaturation as low as 78% with the patient's obstructive events  CARDIAC DATA: Occasional PAC and PVC noted  MOVEMENT/PARASOMNIA: 375 periodic limb movements were seen, with 4 per hour resulting in arousal or awakening. Abnormal behaviors noted.  IMPRESSION/ RECOMMENDATION:    1) moderate obstructive sleep apnea/hypopnea syndrome, with an AHI of 19 events per hour and oxygen desaturation as low as 78%. Treatment for this degree of sleep apnea can include a trial of weight loss alone, upper airway surgery, dental appliance, and also CPAP. Clinical correlation is suggested.   2) occasional PAC and PVC noted, but no clinically significant arrhythmias were seen  3) very large numbers of periodic limb movements with what appears to be significant sleep disruption. It is unclear if this is related to the patient's sleep disordered breathing, or whether  she may have a concomitant primary movement disorder of sleep. Clinical correlation is suggested.    Kathee Delton Diplomate, American Board of Sleep Medicine  ELECTRONICALLY SIGNED ON:  03/12/2015, 5:33 PM [place] PH: [phone]   FX: [phone] [place]

## 2015-03-26 ENCOUNTER — Encounter: Payer: Self-pay | Admitting: Family Medicine

## 2015-03-26 ENCOUNTER — Ambulatory Visit (INDEPENDENT_AMBULATORY_CARE_PROVIDER_SITE_OTHER): Payer: Medicare Other | Admitting: Family Medicine

## 2015-03-26 VITALS — BP 140/86 | HR 85 | Ht 61.75 in | Wt 188.0 lb

## 2015-03-26 DIAGNOSIS — M9908 Segmental and somatic dysfunction of rib cage: Secondary | ICD-10-CM

## 2015-03-26 DIAGNOSIS — R7309 Other abnormal glucose: Secondary | ICD-10-CM | POA: Diagnosis not present

## 2015-03-26 DIAGNOSIS — G894 Chronic pain syndrome: Secondary | ICD-10-CM

## 2015-03-26 DIAGNOSIS — M9903 Segmental and somatic dysfunction of lumbar region: Secondary | ICD-10-CM | POA: Diagnosis not present

## 2015-03-26 DIAGNOSIS — M5090 Cervical disc disorder, unspecified, unspecified cervical region: Secondary | ICD-10-CM

## 2015-03-26 DIAGNOSIS — F329 Major depressive disorder, single episode, unspecified: Secondary | ICD-10-CM

## 2015-03-26 DIAGNOSIS — G4710 Hypersomnia, unspecified: Secondary | ICD-10-CM | POA: Diagnosis not present

## 2015-03-26 DIAGNOSIS — R768 Other specified abnormal immunological findings in serum: Secondary | ICD-10-CM | POA: Diagnosis not present

## 2015-03-26 DIAGNOSIS — G4730 Sleep apnea, unspecified: Secondary | ICD-10-CM

## 2015-03-26 DIAGNOSIS — M9902 Segmental and somatic dysfunction of thoracic region: Secondary | ICD-10-CM | POA: Diagnosis not present

## 2015-03-26 DIAGNOSIS — M999 Biomechanical lesion, unspecified: Secondary | ICD-10-CM

## 2015-03-26 DIAGNOSIS — F32A Depression, unspecified: Secondary | ICD-10-CM

## 2015-03-26 MED ORDER — VENLAFAXINE HCL ER 37.5 MG PO CP24: 37.5000 mg | ORAL_CAPSULE | Freq: Every day | ORAL | Status: DC

## 2015-03-26 NOTE — Assessment & Plan Note (Signed)
Patient does have significant anxiety as well as I think some underlying depression. Patient was on Wellbutrin previously but patient states that she did not like how it made her feel. Patient has had difficulty with Celexa previously. Patient will be put on a low dose of Effexor and will discuss with primary care provider. Patient and will come back and see me again in 2 weeks. Patient does have Xanax as needed for sleep. Patient though is having significant trouble with sleep apnea that is likely causing it as well. Patient has been referred to pulmonology.

## 2015-03-26 NOTE — Patient Instructions (Signed)
I am sorry you are hurting so much We still will check uric acid soon with next blood draw.  Effexor 37.5mg  daily We will get you fitted for CPAP Stop all vitamins for next 2 weeks.  Nutrition counseling will be calling and rheumatology will be calling.  See me again in 2 weeks.

## 2015-03-26 NOTE — Progress Notes (Signed)
  [name] [place], [place] [address] Phone: [phone] Subjective:     CC: back pain follow up.   [id number] Felicia Perry is a 69 y.o. female coming in with complaint of right arm pain.  Patient was seen previously and does have cervical disc disorder as well as a calcific rotator cuff tendinopathy. Patient has been really responding very well to conservative therapy. Continued back pain as well.  Been doing exercises and increasing activity. Patient is able to do all daily activities and is going to deep massage which is also helpful. Patient though states that unfortunately she seems to be more fatigued than usual. Patient states that at the end of the [place] she wants to do is go to bed. Patient finds is very difficult overall. Patient states that the pain seems to be a 24-hour problem now.  Patient was having more of a chronic fatigue. Patient has had a sleep study recently that did show the patient has sleep apnea.  Patient is also having arthropathy. Patient's workup showed the patient did have a vitamin D deficiency and we are awaiting uric acid. Patient has had a positive ANA and we have discussed referral to rheumatology.   Past medical history, social, surgical and family history all reviewed in electronic medical record.   History of _+ANA  Review of Systems: No headache, visual changes, nausea, vomiting, diarrhea, constipation, dizziness, abdominal pain, skin rash, fevers, chills, night sweats, weight loss, swollen lymph nodes, body aches, joint swelling, muscle aches, chest pain, shortness of breath, mood changes.   Objective Blood pressure 140/86, pulse 85, height 5' 1.75" (1.568 m), weight 188 lb (85.276 kg), SpO2 97 %.  General: No apparent distress alert and oriented x3 mood and affect normal, dressed appropriately.  HEENT: Pupils equal, extraocular movements intact  Respiratory: Patient does have some shallow breathing  as well as some expiratory wheezing Cardiovascular: No lower extremity edema, non tender, no erythema  Skin: Warm dry intact with no signs of infection or rash on extremities or on axial skeleton.  Abdomen: Soft nontender  Neuro: Cranial nerves II through XII are intact, neurovascularly intact in all extremities with 2+ DTRs and 2+ pulses.  Lymph: No lymphadenopathy of posterior or anterior cervical chain or axillae bilaterally.  Gait more gingerly walking than previous exam. MSK:  Non tender with full range of motion and good stability and symmetric strength and tone of elbows, wrist, hip and ankles bilaterally. Mild osteophytic changes of multiple joints.    Osteopathic findings of  Cervical C2 flexed rotated and side bent right  Thoracic T2 extended rotated and side bent right inhaled second rib T5 extended rotated and side bent left  Lumbar L2 flexed rotated and side bent right  Increased tightness of the cervical spine from previous exam, more pain overall.   Impression and Recommendations:     This case required medical decision making of moderate complexity.

## 2015-03-26 NOTE — Assessment & Plan Note (Signed)
Felicia Perry is having more like a chronic pain syndrome. Differential includes autoimmune diseases and patient will be referred to rheumatology. Vision could also have a uric acid arthropathy and we are awaiting labs but patient would like to wait until she gets her other labs. I 1 patient does stop the over-the-counter medications at this time and patient is going to be put on Effexor for depression as well as potentially a chronic pain syndrome. We will discuss this with primary care provider. Patient then will continue with some of the home exercises and follow-up with me in 2 weeks for further evaluation and treatment.

## 2015-03-26 NOTE — Progress Notes (Signed)
Pre visit review using our clinic review tool, if applicable. No additional management support is needed unless otherwise documented below in the visit note. 

## 2015-03-26 NOTE — Assessment & Plan Note (Signed)
Decision today to treat with OMT was based on Physical Exam  After verbal consent patient was treated with HVLA, ME techniques in thoracic and rib areas  Patient tolerated the procedure well with improvement in symptoms  Patient given exercises, stretches and lifestyle modifications  See medications in patient instructions if given  Patient will follow up in 2 weeks

## 2015-03-26 NOTE — Assessment & Plan Note (Signed)
Patient continues to have significant discomfort. I do 1 to get uric acid level still. I do believe there is some underlying chronic pain syndrome could also be contributing. Patient has a positive ANA and is going to be referred to rheumatology for further workup. Patient is significantly more frustrated and icing her previously and is not responding to the conservative therapy as well. Patient and will come back and see me again in 2 weeks for further evaluation and treatment.

## 2015-03-27 ENCOUNTER — Telehealth: Payer: Self-pay | Admitting: Oncology

## 2015-03-27 NOTE — Telephone Encounter (Signed)
Faxed office note to [facility] dermatology

## 2015-04-10 ENCOUNTER — Other Ambulatory Visit: Payer: Self-pay | Admitting: Family Medicine

## 2015-04-11 MED ORDER — LEVOCETIRIZINE DIHYDROCHLORIDE 5 MG PO TABS: ORAL_TABLET | ORAL | Status: DC

## 2015-04-11 NOTE — Telephone Encounter (Signed)
Ok to fill 

## 2015-04-11 NOTE — Telephone Encounter (Signed)
Ok to refill 

## 2015-04-16 ENCOUNTER — Ambulatory Visit (INDEPENDENT_AMBULATORY_CARE_PROVIDER_SITE_OTHER): Payer: Medicare Other | Admitting: Family Medicine

## 2015-04-16 ENCOUNTER — Encounter: Payer: Self-pay | Admitting: Family Medicine

## 2015-04-16 VITALS — BP 138/74 | HR 70 | Ht 61.75 in | Wt 178.0 lb

## 2015-04-16 DIAGNOSIS — M9902 Segmental and somatic dysfunction of thoracic region: Secondary | ICD-10-CM

## 2015-04-16 DIAGNOSIS — M9908 Segmental and somatic dysfunction of rib cage: Secondary | ICD-10-CM | POA: Diagnosis not present

## 2015-04-16 DIAGNOSIS — M5090 Cervical disc disorder, unspecified, unspecified cervical region: Secondary | ICD-10-CM | POA: Diagnosis not present

## 2015-04-16 DIAGNOSIS — G894 Chronic pain syndrome: Secondary | ICD-10-CM

## 2015-04-16 DIAGNOSIS — M9903 Segmental and somatic dysfunction of lumbar region: Secondary | ICD-10-CM | POA: Diagnosis not present

## 2015-04-16 DIAGNOSIS — M999 Biomechanical lesion, unspecified: Secondary | ICD-10-CM

## 2015-04-16 NOTE — Assessment & Plan Note (Signed)
We'll continue to monitor and vitamin D 1000 units daily

## 2015-04-16 NOTE — Progress Notes (Signed)
Pre visit review using our clinic review tool, if applicable. No additional management support is needed unless otherwise documented below in the visit note. 

## 2015-04-16 NOTE — Patient Instructions (Addendum)
Good to see you Ice is your friend when you need it With the effexor go to 75 mg (2 pills daily) With the vitamins only do Vitamin D 1000 IU daily You are doing amazing!!!!! See me again in 3 weeks

## 2015-04-16 NOTE — Progress Notes (Signed)
  [name] [place], [place] [address] Phone: [phone] Subjective:     CC: back pain follow up. Chronic pain follow-up  [id number] Felicia Perry is a 69 y.o. female coming in with complaint of right arm pain.  Patient was seen previously and does have cervical disc disorder as well as a calcific rotator cuff tendinopathy. Patient has been really responding very well to conservative therapy. Continued back pain as well.  Been doing exercises and increasing activity. Patient is able to do all daily activities and is going to deep massage which is also helpful.  He didn't make his significant change due to patient's chronic fatigue and possibly underlying depression. Patient was put on Effexor can stop the over-the-counter natural supplementations. Patient states that she is feeling significantly better, she has lost 10 pounds, she has noticed more energy and she is also noticed that she has significant decrease in anxiety as well. Patient is very happy with the results of far. Denies any side effects to the medication.  Patient was having more of a chronic fatigue. Patient has had a sleep study recently that did show the patient has sleep apnea. Patient is being fitted for the CPAP in the following weeks.  Patient is also having arthropathy. Patient's workup showed the patient did have a vitamin D deficiency and we are awaiting uric acid. Patient has had a positive ANA and we have discussed referral to rheumatology.   Past medical history, social, surgical and family history all reviewed in electronic medical record.   History of _+ANA following up with rheumatology in the near future.  Review of Systems: No headache, visual changes, nausea, vomiting, diarrhea, constipation, dizziness, abdominal pain, skin rash, fevers, chills, night sweats, weight loss, swollen lymph nodes, body aches, joint swelling, muscle aches, chest pain, shortness of  breath, mood changes.   Objective Blood pressure 138/74, pulse 70, height 5' 1.75" (1.568 m), weight 178 lb (80.74 kg), SpO2 98 %.  General: No apparent distress alert and oriented x3 mood and affect normal, dressed appropriately.  HEENT: Pupils equal, extraocular movements intact  Respiratory: Patient does have some shallow breathing as well as some expiratory wheezing Cardiovascular: No lower extremity edema, non tender, no erythema  Skin: Warm dry intact with no signs of infection or rash on extremities or on axial skeleton.  Abdomen: Soft nontender  Neuro: Cranial nerves II through XII are intact, neurovascularly intact in all extremities with 2+ DTRs and 2+ pulses.  Lymph: No lymphadenopathy of posterior or anterior cervical chain or axillae bilaterally.  Gait normal with good balance and coordination MSK:  Non tender with full range of motion and good stability and symmetric strength and tone of elbows, wrist, hip and ankles bilaterally. Mild osteophytic changes of multiple joints.    Osteopathic findings of  Cervical C2 flexed rotated and side bent right  Thoracic T2 extended rotated and side bent right inhaled second rib T5 extended rotated and side bent left  Lumbar L2 flexed rotated and side bent right  Significant improvement in tightness compared to previous exam.   Impression and Recommendations:     This case required medical decision making of moderate complexity.

## 2015-04-16 NOTE — Assessment & Plan Note (Signed)
Decision today to treat with OMT was based on Physical Exam  After verbal consent patient was treated with HVLA, ME techniques in thoracic and rib areas  Patient tolerated the procedure well with improvement in symptoms  Patient given exercises, stretches and lifestyle modifications  See medications in patient instructions if given  Patient will follow up in 3-4 weeks

## 2015-04-16 NOTE — Assessment & Plan Note (Signed)
Patient is doing very well with the Effexor at this time with no side effects. Patient denies any homicidal or suicidal ideation. We will increase patient to 75 mg daily. Patient notes if she has any side effects she will call and we can go back down to the 37.5 mg if needed. The patient tolerates a 75 mg we will get her new prescription. We discussed home exercises on a regular basis. We discussed the importance of watching her diet. Patient is losing weight and encourage her to continue to do so. Patient will follow-up and see me again in 3-4 weeks for further evaluation and treatment.

## 2015-05-09 ENCOUNTER — Encounter: Payer: Self-pay | Admitting: Family Medicine

## 2015-05-09 ENCOUNTER — Ambulatory Visit (INDEPENDENT_AMBULATORY_CARE_PROVIDER_SITE_OTHER): Payer: Medicare Other | Admitting: Family Medicine

## 2015-05-09 VITALS — BP 116/80 | HR 64 | Wt 169.0 lb

## 2015-05-09 DIAGNOSIS — G894 Chronic pain syndrome: Secondary | ICD-10-CM

## 2015-05-09 DIAGNOSIS — M9902 Segmental and somatic dysfunction of thoracic region: Secondary | ICD-10-CM | POA: Diagnosis not present

## 2015-05-09 DIAGNOSIS — M9903 Segmental and somatic dysfunction of lumbar region: Secondary | ICD-10-CM

## 2015-05-09 DIAGNOSIS — M9908 Segmental and somatic dysfunction of rib cage: Secondary | ICD-10-CM | POA: Diagnosis not present

## 2015-05-09 DIAGNOSIS — M999 Biomechanical lesion, unspecified: Secondary | ICD-10-CM

## 2015-05-09 NOTE — Progress Notes (Signed)
Pre visit review using our clinic review tool, if applicable. No additional management support is needed unless otherwise documented below in the visit note. 

## 2015-05-09 NOTE — Assessment & Plan Note (Signed)
Patient is been doing remarkably well at this time. Encouraged to continue the Effexor as well as continue the home exercises. Patient continues to lose weight which I think will be the most beneficial thing for. Patient is starting to improve in some of the somnolence that she was having previously. Patient hasn't been diagnosed with more of an osteoarthritis and likely not an autoimmune disease. Is also helping patient with her mood. Patient come back and see me again in 4-6 weeks for further evaluation and treatment.

## 2015-05-09 NOTE — Progress Notes (Signed)
  [facility] [address] [place], [place] [address] Phone: [phone] Subjective:     CC: back pain follow up. Chronic pain follow-up  [id number] Felicia Perry is a 69 y.o. female coming in with complaint of right arm pain.  Patient was seen previously and does have cervical disc disorder as well as a calcific rotator cuff tendinopathy. Patient has been really responding very well to conservative therapy. Continued back pain as well.  Been doing exercises and increasing activity. Patient is able to do all daily activities and is going to deep massage which is also helpful.  He didn't make his significant change due to patient's chronic fatigue and possibly underlying depression. Patient was put on Effexor can stop the over-the-counter natural supplementations. Patient states that she is feeling significantly better Patient is very happy with the results of far. Denies any side effects to the medication. Patient continues to lose weight and has lost not 20 pounds since starting the medication.  Patient was having more of a chronic fatigue. Patient has had a sleep study recently that did show the patient has sleep apnea. Patient is being fitted for the CPAP in the following weeks.  Patient is also having arthropathy. Patient's workup showed the patient did have a vitamin D deficiency and we are awaiting uric acid. Patient has had a positive ANA and we have discussed referral to rheumatology. Patient signed rheumatology and has diagnosis as more osteoarthritis. Patient was given a recent knee injection. States that the knee is feeling somewhat better.   Past medical history, social, surgical and family history all reviewed in electronic medical record.   History of _+ANA following up with rheumatology in the near future.  Review of Systems: No headache, visual changes, nausea, vomiting, diarrhea, constipation, dizziness, abdominal pain, skin rash, fevers,  chills, night sweats, weight loss, swollen lymph nodes, body aches, joint swelling, muscle aches, chest pain, shortness of breath, mood changes.   Objective Blood pressure 116/80, pulse 64, weight 169 lb (76.658 kg), SpO2 94 %.  General: No apparent distress alert and oriented x3 mood and affect normal, dressed appropriately.  HEENT: Pupils equal, extraocular movements intact  Respiratory: Patient does have some shallow breathing as well as some expiratory wheezing Cardiovascular: No lower extremity edema, non tender, no erythema  Skin: Warm dry intact with no signs of infection or rash on extremities or on axial skeleton.  Abdomen: Soft nontender  Neuro: Cranial nerves II through XII are intact, neurovascularly intact in all extremities with 2+ DTRs and 2+ pulses.  Lymph: No lymphadenopathy of posterior or anterior cervical chain or axillae bilaterally.  Gait normal with good balance and coordination MSK:  Non tender with full range of motion and good stability and symmetric strength and tone of elbows, wrist, hip and ankles bilaterally. Mild osteophytic changes of multiple joints.    Osteopathic findings of  Cervical C2 flexed rotated and side bent right  Thoracic T2 extended rotated and side bent right inhaled second rib T5 extended rotated and side bent left  Lumbar L2 flexed rotated and side bent right   Impression and Recommendations:     This case required medical decision making of moderate complexity.

## 2015-05-09 NOTE — Assessment & Plan Note (Signed)
Decision today to treat with OMT was based on Physical Exam  After verbal consent patient was treated with HVLA, ME techniques in thoracic and rib areas  Patient tolerated the procedure well with improvement in symptoms  Patient given exercises, stretches and lifestyle modifications  See medications in patient instructions if given  Patient will follow up in 4 weeks

## 2015-05-09 NOTE — Patient Instructions (Addendum)
You are doing great! Ice is your friend Continue the exercises and stay active I am so happy the effexor Keep staying active See me again in 4 weeks.

## 2015-05-28 ENCOUNTER — Telehealth: Payer: Self-pay | Admitting: *Deleted

## 2015-05-28 MED ORDER — VENLAFAXINE HCL ER 75 MG PO CP24: 75.0000 mg | ORAL_CAPSULE | Freq: Every day | ORAL | Status: DC

## 2015-05-28 NOTE — Telephone Encounter (Signed)
Done and sent in rx.

## 2015-05-28 NOTE — Telephone Encounter (Signed)
Left msg on triage stating pt states md increase her effexor to 75 mg. Wanting to verify if so need new rx. Pls advise...Johny Chess

## 2015-06-04 ENCOUNTER — Encounter: Payer: Self-pay | Admitting: Family Medicine

## 2015-06-04 ENCOUNTER — Ambulatory Visit (INDEPENDENT_AMBULATORY_CARE_PROVIDER_SITE_OTHER): Payer: Medicare Other | Admitting: Family Medicine

## 2015-06-04 VITALS — BP 110/64 | HR 60 | Ht 61.75 in | Wt 162.0 lb

## 2015-06-04 DIAGNOSIS — G894 Chronic pain syndrome: Secondary | ICD-10-CM

## 2015-06-04 DIAGNOSIS — F329 Major depressive disorder, single episode, unspecified: Secondary | ICD-10-CM | POA: Diagnosis not present

## 2015-06-04 DIAGNOSIS — M9902 Segmental and somatic dysfunction of thoracic region: Secondary | ICD-10-CM | POA: Diagnosis not present

## 2015-06-04 DIAGNOSIS — M9908 Segmental and somatic dysfunction of rib cage: Secondary | ICD-10-CM | POA: Diagnosis not present

## 2015-06-04 DIAGNOSIS — M9903 Segmental and somatic dysfunction of lumbar region: Secondary | ICD-10-CM

## 2015-06-04 DIAGNOSIS — F32A Depression, unspecified: Secondary | ICD-10-CM

## 2015-06-04 DIAGNOSIS — M999 Biomechanical lesion, unspecified: Secondary | ICD-10-CM

## 2015-06-04 NOTE — Assessment & Plan Note (Signed)
Decision today to treat with OMT was based on Physical Exam  After verbal consent patient was treated with HVLA, ME techniques in thoracic and rib areas  Patient tolerated the procedure well with improvement in symptoms  Patient given exercises, stretches and lifestyle modifications  See medications in patient instructions if given  Patient will follow up in 6-8 weeks

## 2015-06-04 NOTE — Patient Instructions (Addendum)
This is so great!  Thank you for the good news Do not change anything.  Continue the medication Continue to watch the diet, I am so proud of you! Lets go 6 weeks and enjoy your 4 day work week!

## 2015-06-04 NOTE — Assessment & Plan Note (Signed)
Significant improvement on the Effexor.

## 2015-06-04 NOTE — Assessment & Plan Note (Signed)
Patient overall is doing significantly better at this time. With patient continuing to lose weight and be active I think she'll continue to improve no family will improve on some of her other chronic comorbidities. Patient is not having any significant discomfort at this time and is going to be cutting back on her work. Patient's mood is significantly better as well. We'll continue the same dose of Effexor. Patient will see me on a regular basis for manipulation but we will space her at the 6-8 weeks.

## 2015-06-04 NOTE — Progress Notes (Signed)
  [facility] [address] [place], [place] [address] Phone: [phone] Subjective:     CC: back pain follow up. Chronic pain follow-up  [id number] Felicia Perry is a 69 y.o. female coming in with complaint of right arm pain.  Patient was seen previously and does have cervical disc disorder as well as a calcific rotator cuff tendinopathy. Patient has been really responding very well to conservative therapy. Continued back pain as well.  Been doing exercises and increasing activity. Patient is able to do all daily activities and is going to deep massage which is also helpful.  He didn't make his significant change due to patient's chronic fatigue and possibly underlying depression. Patient was put on Effexor and continues to improve as well as lose weight. Patient states that she has not felt this good in years. Patient also states that she is very active and is sleeping more comfortably at night as well.  Patient is sleeping throughout the night and does not believe that she needs a sleep study at this time.  Patient is also having arthropathy. Patient's workup showed the patient did have a vitamin D deficiency and we are awaiting uric acid. Patient has had a positive ANA and has seen rheumatology patient is to follow-up one time but does not think she will need any significant changes.  Wt Readings from Last 3 Encounters:  06/04/15 162 lb (73.483 kg)  05/09/15 169 lb (76.658 kg)  04/16/15 178 lb (80.74 kg)   .   Past medical history, social, surgical and family history all reviewed in electronic medical record.   History of _+ANA following up with rheumatology in the near future.  Review of Systems: No headache, visual changes, nausea, vomiting, diarrhea, constipation, dizziness, abdominal pain, skin rash, fevers, chills, night sweats, weight loss, swollen lymph nodes, body aches, joint swelling, muscle aches, chest pain, shortness of breath, mood  changes.   Objective Blood pressure 110/64, pulse 60, height 5' 1.75" (1.568 m), weight 162 lb (73.483 kg), SpO2 96 %.  General: No apparent distress alert and oriented x3 mood and affect normal, dressed appropriately.  HEENT: Pupils equal, extraocular movements intact  Respiratory: Patient does have some shallow breathing as well as some expiratory wheezing Cardiovascular: No lower extremity edema, non tender, no erythema  Skin: Warm dry intact with no signs of infection or rash on extremities or on axial skeleton.  Abdomen: Soft nontender  Neuro: Cranial nerves II through XII are intact, neurovascularly intact in all extremities with 2+ DTRs and 2+ pulses.  Lymph: No lymphadenopathy of posterior or anterior cervical chain or axillae bilaterally.  Gait normal with good balance and coordination MSK:  Non tender with full range of motion and good stability and symmetric strength and tone of elbows, wrist, hip and ankles bilaterally. Mild osteophytic changes of multiple joints.    Osteopathic findings of  Cervical C2 flexed rotated and side bent right  Thoracic T2 extended rotated and side bent right inhaled second rib T5 extended rotated and side bent left   Impression and Recommendations:     This case required medical decision making of moderate complexity.

## 2015-06-04 NOTE — Progress Notes (Signed)
Pre visit review using our clinic review tool, if applicable. No additional management support is needed unless otherwise documented below in the visit note. 

## 2015-06-06 ENCOUNTER — Other Ambulatory Visit: Payer: Self-pay | Admitting: Family Medicine

## 2015-06-12 ENCOUNTER — Institutional Professional Consult (permissible substitution): Payer: Medicare Other | Admitting: Pulmonary Disease

## 2015-07-12 ENCOUNTER — Other Ambulatory Visit: Payer: Self-pay | Admitting: Family Medicine

## 2015-07-12 NOTE — Telephone Encounter (Signed)
Refill ok? 

## 2015-07-12 NOTE — Telephone Encounter (Signed)
Yes thanks 

## 2015-07-16 ENCOUNTER — Ambulatory Visit: Payer: Medicare Other | Admitting: Family Medicine

## 2015-07-19 ENCOUNTER — Ambulatory Visit: Payer: Medicare Other | Admitting: Internal Medicine

## 2015-07-23 ENCOUNTER — Ambulatory Visit (INDEPENDENT_AMBULATORY_CARE_PROVIDER_SITE_OTHER): Payer: Medicare Other | Admitting: Family Medicine

## 2015-07-23 ENCOUNTER — Encounter: Payer: Self-pay | Admitting: Family Medicine

## 2015-07-23 VITALS — BP 138/86 | HR 70 | Ht 61.75 in | Wt 154.0 lb

## 2015-07-23 DIAGNOSIS — F32A Depression, unspecified: Secondary | ICD-10-CM

## 2015-07-23 DIAGNOSIS — F329 Major depressive disorder, single episode, unspecified: Secondary | ICD-10-CM

## 2015-07-23 DIAGNOSIS — M9903 Segmental and somatic dysfunction of lumbar region: Secondary | ICD-10-CM

## 2015-07-23 DIAGNOSIS — G894 Chronic pain syndrome: Secondary | ICD-10-CM

## 2015-07-23 DIAGNOSIS — M9908 Segmental and somatic dysfunction of rib cage: Secondary | ICD-10-CM

## 2015-07-23 DIAGNOSIS — M9902 Segmental and somatic dysfunction of thoracic region: Secondary | ICD-10-CM

## 2015-07-23 DIAGNOSIS — M999 Biomechanical lesion, unspecified: Secondary | ICD-10-CM

## 2015-07-23 MED ORDER — TRIAMCINOLONE ACETONIDE 0.5 % EX CREA: 1.0000 "application " | TOPICAL_CREAM | Freq: Two times a day (BID) | CUTANEOUS | Status: DC

## 2015-07-23 NOTE — Assessment & Plan Note (Signed)
Decision today to treat with OMT was based on Physical Exam  After verbal consent patient was treated with HVLA, ME techniques in thoracic and rib areas  Patient tolerated the procedure well with improvement in symptoms  Patient given exercises, stretches and lifestyle modifications  See medications in patient instructions if given  Patient will follow up in 8 weeks

## 2015-07-23 NOTE — Progress Notes (Signed)
  [name] [place], [place] [address] Phone: [phone] Subjective:     CC: back pain follow up. Chronic pain follow-up  [id number] Felicia Perry is a 69 y.o. female coming in with complaint of chronic low back pain. Patient was found to have more of a spectrum of chronic pain syndrome with a piriformis syndrome. Patient was started on Effexor and has noticed significant improvement with increasing weight loss as well as increasing energy. Patient was to start being more active and doing home exercises. Patient had stopped working as much which she takes also was beneficial. Patient states she has a much more positive outlook on the future. 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 Patient is looking to sell her town home and buy some land and enjoy life.   Patient continues to lose weight and is feeling better overall. States that she seems to have plateaued recently.  Wt Readings from Last 3 Encounters:  07/23/15 154 lb (69.854 kg)  06/04/15 162 lb (73.483 kg)  05/09/15 169 lb (76.658 kg)   .   Past medical history, social, surgical and family history all reviewed in electronic medical record.   History of _+ANA following up with rheumatology in the near future.  Review of Systems: No headache, visual changes, nausea, vomiting, diarrhea, constipation, dizziness, abdominal pain, skin rash, fevers, chills, night sweats, weight loss, swollen lymph nodes, body aches, joint swelling, muscle aches, chest pain, shortness of breath, mood changes.   Objective Blood pressure 138/86, pulse 70, height 5' 1.75" (1.568 m), weight 154 lb (69.854 kg), SpO2 97 %.  General: No apparent distress alert and oriented x3 mood and affect normal, dressed appropriately.  HEENT: Pupils equal, extraocular movements intact  Respiratory: Patient does have some shallow breathing as well as some expiratory wheezing Cardiovascular: No lower extremity  edema, non tender, no erythema  Skin: Warm dry intact with no signs of infection or rash on extremities or on axial skeleton.  Abdomen: Soft nontender  Neuro: Cranial nerves II through XII are intact, neurovascularly intact in all extremities with 2+ DTRs and 2+ pulses.  Lymph: No lymphadenopathy of posterior or anterior cervical chain or axillae bilaterally.  Gait normal with good balance and coordination MSK:  Non tender with full range of motion and good stability and symmetric strength and tone of elbows, wrist, hip and ankles bilaterally. Mild osteoarthritic changes of multiple joints.    Osteopathic findings of  Cervical C2 flexed rotated and side bent right  Thoracic T2 extended rotated and side bent right inhaled second rib T5 extended rotated and side bent left Same pattern as previously   Impression and Recommendations:     This case required medical decision making of moderate complexity.

## 2015-07-23 NOTE — Assessment & Plan Note (Signed)
Patient overall is doing relatively well. We will continue with the same regimen at this time. Very happy for patient. She will follow-up again in 2 months.

## 2015-07-23 NOTE — Progress Notes (Signed)
Pre visit review using our clinic review tool, if applicable. No additional management support is needed unless otherwise documented below in the visit note. 

## 2015-07-23 NOTE — Assessment & Plan Note (Signed)
Continue Effexor.

## 2015-07-23 NOTE — Patient Instructions (Addendum)
Good to see you Eucerin 2 times daily (aveeno is ok) Get rid of the exfoliant Triamcinolone  2 times a week.  Keep it up, I am so proud of you! Keep up the exercises and stay active See me again in 2 months.

## 2015-08-06 ENCOUNTER — Ambulatory Visit (INDEPENDENT_AMBULATORY_CARE_PROVIDER_SITE_OTHER): Payer: Medicare Other | Admitting: Family Medicine

## 2015-08-06 ENCOUNTER — Encounter: Payer: Self-pay | Admitting: Family Medicine

## 2015-08-06 VITALS — BP 140/72 | HR 62 | Temp 98.4°F | Wt 152.0 lb

## 2015-08-06 DIAGNOSIS — I10 Essential (primary) hypertension: Secondary | ICD-10-CM

## 2015-08-06 DIAGNOSIS — R739 Hyperglycemia, unspecified: Secondary | ICD-10-CM

## 2015-08-06 DIAGNOSIS — K219 Gastro-esophageal reflux disease without esophagitis: Secondary | ICD-10-CM

## 2015-08-06 DIAGNOSIS — Z23 Encounter for immunization: Secondary | ICD-10-CM

## 2015-08-06 DIAGNOSIS — L853 Xerosis cutis: Secondary | ICD-10-CM | POA: Insufficient documentation

## 2015-08-06 LAB — HEMOGLOBIN A1C: Hgb A1c MFr Bld: 5.8 % (ref 4.6–6.5)

## 2015-08-06 MED ORDER — OMEPRAZOLE 20 MG PO CPDR: 20.0000 mg | DELAYED_RELEASE_CAPSULE | Freq: Every day | ORAL | Status: DC

## 2015-08-06 NOTE — Progress Notes (Signed)
Garret Reddish, MD  Subjective:  Felicia Perry is a 69 y.o. year old very pleasant female patient who presents for/with See problem oriented charting ROS- no chest pain or shortness of breath, no headache or blurry vision, no hypoglycemia  Past Medical History-  Patient Active Problem List   Diagnosis Date Noted  . Hyperglycemia 08/06/2015    Priority: High  . Xerosis of skin 08/06/2015    Priority: Medium  . Hyperparathyroidism ([place]) 09/19/2014    Priority: Medium  . Insomnia 07/13/2014    Priority: Medium  . Depression 04/22/2007    Priority: Medium  . Essential hypertension 04/22/2007    Priority: Medium  . ASTHMA 04/22/2007    Priority: Medium  . Limited joint range of motion 07/13/2014    Priority: Low  . Rash 07/13/2014    Priority: Low  . GERD (gastroesophageal reflux disease) 02/25/2011    Priority: Low  . CERVICAL DISC DISORDER 10/10/2007    Priority: Low  . LOW BACK PAIN 07/01/2007    Priority: Low    Medications- reviewed and updated Current Outpatient Prescriptions  Medication Sig Dispense Refill  . amLODipine-valsartan (EXFORGE) 10-320 MG per tablet TAKE ONE TABLET EACH DAY 30 tablet 8  . budesonide-formoterol (SYMBICORT) 160-4.5 MCG/ACT inhaler Inhale 2 puffs into the lungs 2 (two) times daily.    Marland Kitchen levocetirizine (XYZAL) 5 MG tablet 1 tab every am 30 tablet 11  . omeprazole (PRILOSEC) 20 MG capsule Take 1 capsule (20 mg total) by mouth daily. 30 capsule 5  . propranolol (INDERAL) 40 MG tablet Take 1 tablet (40 mg total) by mouth daily. 30 tablet 11  . triamcinolone cream (KENALOG) 0.5 % Apply 1 application topically 2 (two) times daily. To affected areas. 30 g 3  . venlafaxine XR (EFFEXOR-XR) 75 MG 24 hr capsule Take 1 capsule (75 mg total) by mouth daily with breakfast. 90 capsule 1  . acetaminophen (TYLENOL) 325 MG tablet Take 650 mg by mouth every 6 (six) hours as needed.    Marland Kitchen alprazolam (XANAX) 2 MG tablet TAKE 1/2 TO 1 TABLET AT BEDTIME AS NEEDED  FOR SLEEP (Patient not taking: Reported on 08/06/2015) 30 tablet 5   No current facility-administered medications for this visit.    Objective: BP 140/72 mmHg  Pulse 62  Temp(Src) 98.4 F (36.9 C)  Wt 152 lb (68.947 kg) Gen: NAD, resting comfortably CV: RRR no murmurs rubs or gallops Lungs: CTAB no crackles, wheeze, rhonchi Abdomen: soft/nontender/nondistended/normal bowel sounds. No rebound or guarding.  Ext: no edema Skin: warm, dry, skin diffusely dry- states lotioned just before visit with eucerin Neuro: grossly normal, moves all extremities  Assessment/Plan:  Essential hypertension S: mild poor control on Propranolol and valsartan-amlodipine 320-31m. But controlled on previous visits BP Readings from Last 3 Encounters:  08/06/15 140/72  07/23/15 138/86  06/04/15 110/64  A/P: technically at  [id number] goal but prefer <140/90. Patient is to continue weight loss efforts to aroudn 145, we will not increase medication at this time.   Hyperglycemia S: technically met criteria for diabetes with a1c 7.4 about 6 months ago. Patient had been receiving several steroid injections at that time though. We opted for 6 month repeat. In last 6 months, had 1 steroid shot in knee per ortho. 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 Will avoid formal diagnosis unless 6.5 or above. Could consider metformin in 6.1-6.4 range. Also sees Dr. Cruzita Lederer soon who could weigh in. Check bmet along with testing to see level of calcium.    GERD (gastroesophageal reflux disease) S: no breakthrough symptoms since weight loss on prilosec 19m A/P: trial 248mand update in 1 month- if no breakthrough, consider 1052mt that time. Likely improved control due to weight loss.    Xerosis of skin S: though rash on ankles now gone, patient now  experiencing Very dry skin- trial eucerin x 1 week mild improvement, has flakes of skin coming off at times- very embarrassing to patient A/P: use emollient regularly over next month- refer to derm if does not resolve   Return precautions advised. 6 month CPE planned. Phone update 1 month for skin and GERD  Orders Placed This Encounter  Procedures  . Flu Vaccine QUAD 36+ mos IM  . Hemoglobin A1c    [place]  . Basic metabolic panel    [place]    Meds ordered this encounter  Medications  . omeprazole (PRILOSEC) 20 MG capsule    Sig: Take 1 capsule (20 mg total) by mouth daily.    Dispense:  30 capsule    Refill:  5

## 2015-08-06 NOTE — Patient Instructions (Addendum)
Flu shot received today.  A1c -check in lab before you leave  Thrilled for you! Excellent work on weight loss!   For dry skin- continue eucerin For reflux- go down to 20mg  prilosec  Update me in 1 month by phone on both. May refer to derm if skin not doing better. For reflux, may go down to 10mg  of prilosec  Recheck blood pressure before you go

## 2015-08-06 NOTE — Assessment & Plan Note (Addendum)
S: technically met criteria for diabetes with a1c 7.4 about 6 months ago. Patient had been receiving several steroid injections at that time though. We opted for 6 month repeat. In last 6 months, had 1 steroid shot in knee per ortho. 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 Will avoid formal diagnosis unless 6.5 or above. Could consider metformin in 6.1-6.4 range. Also sees Dr. Cruzita Lederer soon who could weigh in. Check bmet along with testing to see level of calcium.

## 2015-08-06 NOTE — Assessment & Plan Note (Signed)
S: mild poor control on Propranolol and valsartan-amlodipine 320-10mg . But controlled on previous visits BP Readings from Last 3 Encounters:  08/06/15 140/72  07/23/15 138/86  06/04/15 110/64  A/P: technically at  [place] 8 goal but prefer <140/90. Patient is to continue weight loss efforts to aroudn 145, we will not increase medication at this time.

## 2015-08-06 NOTE — Assessment & Plan Note (Signed)
S: no breakthrough symptoms since weight loss on prilosec 40mg  A/P: trial 20mg  and update in 1 month- if no breakthrough, consider 10mg  at that time. Likely improved control due to weight loss.

## 2015-08-06 NOTE — Assessment & Plan Note (Signed)
S: though rash on ankles now gone, patient now experiencing Very dry skin- trial eucerin x 1 week mild improvement, has flakes of skin coming off at times- very embarrassing to patient A/P: use emollient regularly over next month- refer to derm if does not resolve

## 2015-08-07 LAB — BASIC METABOLIC PANEL
BUN: 19 mg/dL (ref 6–23)
CALCIUM: 9.9 mg/dL (ref 8.4–10.5)
CO2: 33 mEq/L — ABNORMAL HIGH (ref 19–32)
Chloride: 104 mEq/L (ref 96–112)
Creatinine, Ser: 0.92 mg/dL (ref 0.40–1.20)
GFR: 64.31 mL/min (ref >60.00)
Glucose, Bld: 100 mg/dL — ABNORMAL HIGH (ref 70–99)
Potassium: 4.3 mEq/L (ref 3.5–5.1)
SODIUM: 143 meq/L (ref 135–145)

## 2015-08-20 ENCOUNTER — Encounter: Payer: Self-pay | Admitting: Internal Medicine

## 2015-08-20 ENCOUNTER — Ambulatory Visit (INDEPENDENT_AMBULATORY_CARE_PROVIDER_SITE_OTHER): Payer: Medicare Other | Admitting: Internal Medicine

## 2015-08-20 VITALS — BP 118/60 | HR 62 | Temp 97.9°F | Resp 12 | Wt 153.4 lb

## 2015-08-20 DIAGNOSIS — E213 Hyperparathyroidism, unspecified: Secondary | ICD-10-CM

## 2015-08-20 DIAGNOSIS — E559 Vitamin D deficiency, unspecified: Secondary | ICD-10-CM

## 2015-08-20 NOTE — Patient Instructions (Signed)
Please restart vitamin D 4000 units daily.  Please come back for labs in 2 months.  Please return in 1 year.

## 2015-08-20 NOTE — Progress Notes (Signed)
Patient ID: Felicia Perry, female   DOB: [birth year], 69 y.o.   MRN: [id number]   HPI  Felicia Perry is a 69 y.o.-year-old female, returning for normocalcemic hyperparathyroidism. Pt previously seen by Dr Loanne Drilling. Last visit with me was 7 mo ago.  She is now off the Prednisone >> lost ~ 30 lbs - diet and exercise (bicycle x1 Sun, exercise 3x a week)  Pt was dx with hyperparathyroidism in 2012. I reviewed pt's pertinent labs - latest 4 PTH levels normal, checked with [facility] assay: Lab Results  Component Value Date   PTH 20 01/15/2015   PTH Comment 01/15/2015   PTH 21 09/18/2014   PTH 30 07/30/2014   PTH 50.0 07/21/2013   PTH 87.2* 08/19/2012   PTH 54.7 02/19/2012   PTH 84.7* 11/13/2011   PTH 72.7* 09/21/2011    Ca levels also normal since 2014: Lab Results  Component Value Date   CALCIUM 9.9 08/06/2015   CALCIUM 9.5 01/29/2015   CALCIUM 9.5 01/15/2015   CALCIUM 9.3 11/27/2014   CALCIUM 9.4 11/09/2014   CALCIUM 9.2 09/25/2014   CALCIUM 9.4 09/18/2014   CALCIUM 9.6 07/30/2014   CALCIUM 10.6* 08/25/2013   CALCIUM 9.7 07/21/2013   Component     Latest Ref Rng 07/30/2014  PTH-Related Protein (PTH-RP)     14 - 27 pg/mL 17   Of note, she had a NM parathyroid scan on (11/30/2011): Faint focus of residual activity within the right lobe of thyroid gland. This is indeterminate finding but could represent the site of a parathyroid adenoma. Consider follow-up scan or cross sectional imaging of the neck.  Had a hand fracture in 2014 (traumatic).  No h/o osteoporosis. 09/26/2014: Results:  Lumbar spine (L1-L4) Femoral neck (FN) 33% distal radius  T-score + 0.5 RFN: - 1.5 LFN: - 1.2 Unfortunately, not checked, despite being ordered   Assessment: the BMD is low according to the [facility] classification for osteoporosis (see below). Fracture risk: moderate FRAX score: 10 year major osteoporotic risk: 14.6%. 10 year hip fracture risk: 1.7%. These are under the thresholds for  treatment of 20% and 3%, respectively.  Had early menopause (38-38 y/o), just like her mother.   No h/o kidney stones.  + h/o CKD. Last BUN/Cr: Lab Results  Component Value Date   BUN 19 08/06/2015   CREATININE 0.92 08/06/2015   Pt was on HCTZ, stopped 3.5 years ago.  + h/o vitamin D deficiency. Last level was low >> we increased vit D to 4000 units daily. Unfortunately, when Dr Tamala Julian told her to stop all supplements, she also stopped vit D ... Component     Latest Ref Rng 07/30/2014 09/18/2014 01/15/2015  VITD     30.00 - 100.00 ng/mL 24.57 (L) 31.66 19.91 (L)   When I saw her last, she was on Calcitriol (nonformulary) 0.25 mg 3x a week - started in 2013 by Dr Loanne Drilling. She was wondering whether she needed to continue with Calcitriol as starting 10/2014, it was not covered by her insurance. We started to decrease the calcitriol and she ended up stopping calcitriol by herself. Calcium levels remained normal.  She also has a h/o seasonal asthma, triggered by cold weather >> Prednisone tapers >> weight gain and increased hunger.   ROS: Constitutional: + weight loss, no fatigue, no subjective hyperthermia, + poor sleep Eyes: + blurry vision, no xerophthalmia ENT: no sore throat, no nodules palpated in throat, no dysphagia/odynophagia, no hoarseness Cardiovascular: no CP/SOB/palpitations/leg swelling  Respiratory: no cough/SOB/wheezing Gastrointestinal:  no N/V/D/+ C/no heartburn Musculoskeletal: no muscle/joint aches Skin: no rash, + easy bruising, + itching Neurological: no tremors/numbness/tingling/dizziness  I reviewed pt's medications, allergies, PMH, social hx, family hx, and changes were documented in the history of present illness. Otherwise, unchanged from my initial visit note:  Past Medical History  Diagnosis Date  . Anxiety   . Asthma   . Hypertension   . Arthritis   . Diverticulosis   . Low back pain   . History of shingles   . PUD (peptic ulcer disease)   .  Internal hemorrhoids   . Adenomatous polyp 11/23/2005  . GERD (gastroesophageal reflux disease)   . DIVERTICULOSIS, COLON 04/22/2007         Past Surgical History  Procedure Laterality Date  . 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 Cataract surgery     History   Social History  . Marital Status: Divorced    Spouse Name: N/A    Number of Children: 2   Occupational History  . Cleaning    Social History Main Topics  . Smoking status: Former Smoker, quit 1979  . Smokeless tobacco: Never Used  . Alcohol Use: No  . Drug Use: No  . 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    Current Outpatient Prescriptions on File Prior to Visit  Medication Sig Dispense Refill  . acetaminophen (TYLENOL) 325 MG tablet Take 650 mg by mouth every 6 (six) hours as needed.    Marland Kitchen alprazolam (XANAX) 2 MG tablet TAKE 1/2 TO 1 TABLET AT BEDTIME AS NEEDED FOR SLEEP 30 tablet 5  . amLODipine-valsartan (EXFORGE) 10-320 MG per tablet TAKE ONE TABLET EACH DAY 30 tablet 8  . budesonide-formoterol (SYMBICORT) 160-4.5 MCG/ACT inhaler Inhale 2 puffs into the lungs 2 (two) times daily.    Marland Kitchen levocetirizine (XYZAL) 5 MG tablet 1 tab every am 30 tablet 11  . omeprazole (PRILOSEC) 20 MG capsule Take 1 capsule (20 mg total) by mouth daily. 30 capsule 5  . propranolol (INDERAL) 40 MG tablet Take 1 tablet (40 mg total) by mouth daily. 30 tablet 11  . triamcinolone cream (KENALOG) 0.5 % Apply 1 application topically 2 (two) times daily. To affected areas. 30 g 3  . venlafaxine XR (EFFEXOR-XR) 75 MG 24 hr capsule Take 1 capsule (75 mg total) by mouth daily with breakfast. 90 capsule 1   No current facility-administered medications on file prior to visit.   Allergies  Allergen Reactions  . Celexa [Citalopram Hydrobromide]     psychosis  . Cephalosporins     REACTION: tongue  swelling  . Clarithromycin     REACTION: blisters in mouth  . Clarithromycin   . Doxycycline Hyclate     REACTION: nausea, vomiting  . Levofloxacin     REACTION: tongue swells  . Penicillins     REACTION: per patient causes rash,hives  . Zolpidem Tartrate     REACTION: difficulty with concentration   Family History  Problem Relation Age of Onset  . Heart disease Mother   . Hypertension Mother   . Diabetes Mother   . COPD Father   . Colon cancer Neg Hx    PE: BP 118/60 mmHg  Pulse 62  Temp(Src) 97.9 F (36.6 C) (Oral)  Resp 12  Wt 153  lb 6.4 oz (69.582 kg)  SpO2 98% Body mass index is 28.3 kg/(m^2). Wt Readings from Last 3 Encounters:  08/20/15 153 lb 6.4 oz (69.582 kg)  08/06/15 152 lb (68.947 kg)  07/23/15 154 lb (69.854 kg)   Constitutional: slightly overweight, in NAD. No kyphosis. Eyes: PERRLA, EOMI, no exophthalmos ENT: moist mucous membranes, no thyromegaly, no cervical lymphadenopathy Cardiovascular: RRR, No MRG Respiratory: CTA B Gastrointestinal: abdomen soft, NT, ND, BS+ Musculoskeletal: no deformities, strength intact in all 4 Skin: moist, warm, no rashes Neurological: no tremor with outstretched hands, DTR normal in all 4  Assessment: 1. Normocalcemic hyperparathyroidism  2. Vit D deficiency  Plan: 1. And 2. Patient has had 1 instance of minimally elevated calcium per review of the chart, at 10.6. She had few PTH  levels that were elevated in the past, however, more recently, PTH and calcium levels were normal lately. - She also has a h/o vitamin D deficiency,  with last value being low, at 19.9. I advised her to continue vitamin D over-the-counter and increase to 4000 units daily. She actually stopped this (misunderstood instructions to stop supplements) 3-4 mo ago - She was started on Calcitriol in 2013,  but she was able to come off since last visit. No need to restart. - I will check in 2 mo, after starting vit D 4000 units daily calcium  level intact PTH ([facility]) vitamin D- 25 HO  - I will see the patient back in 1 year - will continue to see her on a yearly basis

## 2015-09-16 ENCOUNTER — Telehealth: Payer: Self-pay | Admitting: Family Medicine

## 2015-09-24 ENCOUNTER — Ambulatory Visit (INDEPENDENT_AMBULATORY_CARE_PROVIDER_SITE_OTHER): Payer: Medicare Other | Admitting: Family Medicine

## 2015-09-24 ENCOUNTER — Encounter: Payer: Self-pay | Admitting: Family Medicine

## 2015-09-24 VITALS — BP 124/72 | HR 63 | Ht 61.75 in | Wt 149.0 lb

## 2015-09-24 DIAGNOSIS — G894 Chronic pain syndrome: Secondary | ICD-10-CM

## 2015-09-24 DIAGNOSIS — M9902 Segmental and somatic dysfunction of thoracic region: Secondary | ICD-10-CM | POA: Diagnosis not present

## 2015-09-24 DIAGNOSIS — M999 Biomechanical lesion, unspecified: Secondary | ICD-10-CM

## 2015-09-24 DIAGNOSIS — M9908 Segmental and somatic dysfunction of rib cage: Secondary | ICD-10-CM

## 2015-09-24 DIAGNOSIS — M9903 Segmental and somatic dysfunction of lumbar region: Secondary | ICD-10-CM

## 2015-09-24 NOTE — Progress Notes (Signed)
Pre visit review using our clinic review tool, if applicable. No additional management support is needed unless otherwise documented below in the visit note. 

## 2015-09-24 NOTE — Progress Notes (Signed)
  [name] [address] [place], [place] [address] Phone: [phone] Subjective:     CC: back pain follow up. Chronic pain follow-up  [id number] Felicia Perry is a 69 y.o. female coming in with complaint of chronic low back pain. Patient was found to have more of a spectrum of chronic pain syndrome with a piriformis syndrome. Patient was started on Effexor and has noticed significant improvement with increasing weight loss as well as increasing energy.  Patient is feeling fantastic. Doing a significant more activity and staying active. Going to the gym 3 times a week as well as biking one time a week. Patient is going to be traveling for the holidays and is looking forward to them. Patient feels wonderful overall.  Wt Readings from Last 3 Encounters:  09/24/15 149 lb (67.586 kg)  08/20/15 153 lb 6.4 oz (69.582 kg)  08/06/15 152 lb (68.947 kg)   .   Past medical history, social, surgical and family history all reviewed in electronic medical record.   History of _+ANA following up with rheumatology in the near future.  Review of Systems: No headache, visual changes, nausea, vomiting, diarrhea, constipation, dizziness, abdominal pain, skin rash, fevers, chills, night sweats, weight loss, swollen lymph nodes, body aches, joint swelling, muscle aches, chest pain, shortness of breath, mood changes.   Objective Blood pressure 124/72, pulse 63, height 5' 1.75" (1.568 m), weight 149 lb (67.586 kg), SpO2 97 %.  General: No apparent distress alert and oriented x3 mood and affect normal, dressed appropriately.  HEENT: Pupils equal, extraocular movements intact  Respiratory: Patient does have some shallow breathing as well as some expiratory wheezing Cardiovascular: No lower extremity edema, non tender, no erythema  Skin: Warm dry intact with no signs of infection or rash on extremities or on axial skeleton.  Abdomen: Soft nontender  Neuro: Cranial nerves  II through XII are intact, neurovascularly intact in all extremities with 2+ DTRs and 2+ pulses.  Lymph: No lymphadenopathy of posterior or anterior cervical chain or axillae bilaterally.  Gait normal with good balance and coordination MSK:  Non tender with full range of motion and good stability and symmetric strength and tone of elbows, wrist, hip and ankles bilaterally. Mild osteoarthritic changes of multiple joints.    Osteopathic findings of  Cervical C2 flexed rotated and side bent right  Thoracic T2 extended rotated and side bent right inhaled second rib T5 extended rotated and side bent left L2 flexed rotated and side bent right   Impression and Recommendations:     This case required medical decision making of moderate complexity.

## 2015-09-24 NOTE — Assessment & Plan Note (Signed)
Doing fantastic with patient taking Effexor. No change in dosing at this time. Encourage her to continue to stay active. We discussed vitamin D supplementation. Patient will come back and see me again in 3 months.

## 2015-09-24 NOTE — Assessment & Plan Note (Signed)
Decision today to treat with OMT was based on Physical Exam  After verbal consent patient was treated with HVLA, ME techniques in thoracic and rib areas  Patient tolerated the procedure well with improvement in symptoms  Patient given exercises, stretches and lifestyle modifications  See medications in patient instructions if given  Patient will follow up in 8-12 weeks

## 2015-10-01 ENCOUNTER — Other Ambulatory Visit (INDEPENDENT_AMBULATORY_CARE_PROVIDER_SITE_OTHER): Payer: Medicare Other

## 2015-10-01 DIAGNOSIS — E213 Hyperparathyroidism, unspecified: Secondary | ICD-10-CM

## 2015-10-01 DIAGNOSIS — E559 Vitamin D deficiency, unspecified: Secondary | ICD-10-CM

## 2015-10-01 LAB — VITAMIN D 25 HYDROXY (VIT D DEFICIENCY, FRACTURES): VITD: 19.34 ng/mL — ABNORMAL LOW (ref 30.00–100.00)

## 2015-10-02 ENCOUNTER — Other Ambulatory Visit: Payer: Self-pay | Admitting: Internal Medicine

## 2015-10-02 DIAGNOSIS — E559 Vitamin D deficiency, unspecified: Secondary | ICD-10-CM

## 2015-10-02 LAB — PTH, INTACT AND CALCIUM
CALCIUM: 9.8 mg/dL (ref 8.7–10.3)
PTH: 38 pg/mL (ref 15–65)

## 2015-10-02 MED ORDER — VITAMIN D (ERGOCALCIFEROL) 1.25 MG (50000 UNIT) PO CAPS: 50000.0000 [IU] | ORAL_CAPSULE | ORAL | Status: DC

## 2015-10-08 NOTE — Telephone Encounter (Signed)
Patient stated that Dr Cruzita Lederer has her taking Vitamin D 50,000 units, she need to know, when she should come back in for a follow up, and what was her levels, please advise

## 2015-10-08 NOTE — Telephone Encounter (Signed)
Called pt and advised her per Dr Arman Filter results via [place]. Pt voiced understanding.

## 2015-10-27 DIAGNOSIS — I2510 Atherosclerotic heart disease of native coronary artery without angina pectoris: Secondary | ICD-10-CM

## 2015-10-27 DIAGNOSIS — I5032 Chronic diastolic (congestive) heart failure: Secondary | ICD-10-CM

## 2015-10-27 HISTORY — DX: Atherosclerotic heart disease of native coronary artery without angina pectoris: I25.10

## 2015-10-27 HISTORY — DX: Chronic diastolic (congestive) heart failure: I50.32

## 2015-12-13 ENCOUNTER — Other Ambulatory Visit: Payer: Self-pay | Admitting: Family Medicine

## 2015-12-16 NOTE — Telephone Encounter (Signed)
Refill done.  

## 2015-12-17 DIAGNOSIS — M19041 Primary osteoarthritis, right hand: Secondary | ICD-10-CM | POA: Diagnosis not present

## 2015-12-17 DIAGNOSIS — M25562 Pain in left knee: Secondary | ICD-10-CM | POA: Diagnosis not present

## 2015-12-17 DIAGNOSIS — M19042 Primary osteoarthritis, left hand: Secondary | ICD-10-CM | POA: Diagnosis not present

## 2015-12-17 DIAGNOSIS — M25561 Pain in right knee: Secondary | ICD-10-CM | POA: Diagnosis not present

## 2016-01-09 ENCOUNTER — Other Ambulatory Visit: Payer: Self-pay | Admitting: Family Medicine

## 2016-01-09 NOTE — Telephone Encounter (Signed)
Refill ok? 

## 2016-01-09 NOTE — Telephone Encounter (Signed)
Yes thanks 

## 2016-01-21 ENCOUNTER — Encounter: Payer: Self-pay | Admitting: Family Medicine

## 2016-01-21 ENCOUNTER — Ambulatory Visit (INDEPENDENT_AMBULATORY_CARE_PROVIDER_SITE_OTHER): Payer: Medicare Other | Admitting: Family Medicine

## 2016-01-21 VITALS — BP 120/72 | HR 63 | Ht 61.75 in | Wt 143.0 lb

## 2016-01-21 DIAGNOSIS — M23207 Derangement of unspecified meniscus due to old tear or injury, left knee: Secondary | ICD-10-CM

## 2016-01-21 DIAGNOSIS — M9902 Segmental and somatic dysfunction of thoracic region: Secondary | ICD-10-CM

## 2016-01-21 DIAGNOSIS — M1990 Unspecified osteoarthritis, unspecified site: Secondary | ICD-10-CM

## 2016-01-21 DIAGNOSIS — G894 Chronic pain syndrome: Secondary | ICD-10-CM

## 2016-01-21 DIAGNOSIS — M9908 Segmental and somatic dysfunction of rib cage: Secondary | ICD-10-CM | POA: Diagnosis not present

## 2016-01-21 DIAGNOSIS — M23201 Derangement of unspecified lateral meniscus due to old tear or injury, left knee: Secondary | ICD-10-CM | POA: Diagnosis not present

## 2016-01-21 DIAGNOSIS — M999 Biomechanical lesion, unspecified: Secondary | ICD-10-CM

## 2016-01-21 DIAGNOSIS — M23204 Derangement of unspecified medial meniscus due to old tear or injury, left knee: Secondary | ICD-10-CM

## 2016-01-21 DIAGNOSIS — M1991 Primary osteoarthritis, unspecified site: Secondary | ICD-10-CM

## 2016-01-21 NOTE — Progress Notes (Signed)
Pre visit review using our clinic review tool, if applicable. No additional management support is needed unless otherwise documented below in the visit note. 

## 2016-01-21 NOTE — Patient Instructions (Signed)
You are a role model  Keep it up  Injected knee to see if that helps Ice 20 minutes 2 times daily. Usually after activity and before bed. Continue all meds and vitamins Enjoy the [place] See me again in 3-4 months.

## 2016-01-21 NOTE — Progress Notes (Signed)
[name] [address] [place], [place] [address] Phone: [phone] Subjective:     CC: back pain follow up. Chronic pain follow-up  [id number] Felicia Perry is a 70 y.o. female coming in with complaint of chronic low back pain. Patient was found to have more of a spectrum of chronic pain syndrome with a piriformis syndrome. Patient was started on Effexor and has noticed significant improvement with increasing weight loss as well as increasing energy.  Patient continues to be very active. Walking 5 days a week. Patient is looking forward to start doing some resistance training. Having no significant pain overall of the back. Patient states though that it has been 4 months and feels like she does need some manipulation.  Patient also is having more of a left knee pain. Has had this previously. Had an injection greater than a year ago. Does have moderate arthritic changes as well as what seemed to be a degenerative meniscal tear. Patient had been doing very well but is having worsening symptoms again. Patient states that it is starting to affect her daily activities. 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  Wt Readings from Last 3 Encounters:  01/21/16 143 lb (64.864 kg)  09/24/15 149 lb (67.586 kg)  08/20/15 153 lb 6.4 oz (69.582 kg)   .   Past medical history, social, surgical and family history all reviewed in electronic medical record.   History of _+ANA following up with rheumatology in the near future.  Review of Systems: No headache, visual changes, nausea, vomiting, diarrhea, constipation, dizziness, abdominal pain, skin rash, fevers, chills, night sweats, weight loss, swollen lymph nodes, body aches, joint swelling, muscle aches, chest pain, shortness of breath, mood changes.   Objective Blood pressure 120/72, pulse 63, height 5' 1.75" (1.568 m), weight 143 lb (64.864 kg), SpO2 96 %.  General: No apparent distress alert  and oriented x3 mood and affect normal, dressed appropriately.  HEENT: Pupils equal, extraocular movements intact  Respiratory: Patient does have some shallow breathing as well as some expiratory wheezing Cardiovascular: No lower extremity edema, non tender, no erythema  Skin: Warm dry intact with no signs of infection or rash on extremities or on axial skeleton.  Abdomen: Soft nontender  Neuro: Cranial nerves II through XII are intact, neurovascularly intact in all extremities with 2+ DTRs and 2+ pulses.  Lymph: No lymphadenopathy of posterior or anterior cervical chain or axillae bilaterally.  Gait normal with good balance and coordination MSK:  Non tender with full range of motion and good stability and symmetric strength and tone of elbows, wrist, hip and ankles bilaterally. Mild osteoarthritic changes of multiple joints. Knee: Left Mild arthritic changes Tender palpation over the medial joint line ROM full in flexion and extension and lower leg rotation. Ligaments with solid consistent endpoints including ACL, PCL, LCL, MCL. Negative Mcmurray's, Apley's, and Thessalonian tests.  painful patellar compression. Patellar glide with mild crepitus. Patellar and quadriceps tendons unremarkable. Hamstring and quadriceps strength is normal.  Contralateral knee mild arthritic changes but no pain Back exam shows the patient is very minimally tender over the sacroiliac joint. Mild upper thoracic spine tenderness but no significant pain.  After informed written and verbal consent, patient was seated on exam table. Left knee was prepped with alcohol swab and utilizing anterolateral approach, patient's left knee space was injected with 4:1  marcaine 0.5%: Kenalog 40mg /dL. Patient tolerated the procedure well without immediate complications.   Osteopathic findings  of  Cervical C2 flexed rotated and side bent right  Thoracic T2 extended rotated and side bent right inhaled second rib T5 extended  rotated and side bent left L2 flexed rotated and side bent right   Impression and Recommendations:     This case required medical decision making of moderate complexity.

## 2016-01-21 NOTE — Assessment & Plan Note (Signed)
Patient has been doing markedly well. Encourage her to continue the home exercises, icing protocol, as well as the same medications. We'll make no changes at this time. Refills will be given for another year. We discussed vitamin D supplementation if necessary. His lungs patient continues to do well we will serially 3-4 months for the same regimen.

## 2016-01-21 NOTE — Assessment & Plan Note (Signed)
Decision today to treat with OMT was based on Physical Exam  After verbal consent patient was treated with HVLA, ME techniques in thoracic and rib areas  Patient tolerated the procedure well with improvement in symptoms  Patient given exercises, stretches and lifestyle modifications  See medications in patient instructions if given  Patient will follow up in 3-4 months

## 2016-01-21 NOTE — Assessment & Plan Note (Signed)
Worsening of previous problem. Patient given injection today and tolerated the procedure well. We discussed home exercises and patient was given handout. Patient does not wear brace. Patient given a trial topical anti-inflammatory's. Patient and will come back and see me again in 3-4 weeks if any pain seems to be worsening otherwise can follow-up as needed with repeating injections every 3-4 months if necessary. Patient was avoid any surgical intervention and decline formal physical therapy.

## 2016-01-31 ENCOUNTER — Telehealth: Payer: Self-pay | Admitting: Internal Medicine

## 2016-01-31 NOTE — Telephone Encounter (Signed)
Patient asked if she need to come in for labs for vitamin D before October? Please advise

## 2016-01-31 NOTE — Telephone Encounter (Signed)
Please read message below and advise.  

## 2016-01-31 NOTE — Telephone Encounter (Signed)
Yes, in December 2016 we started ergocalciferol and I advised her to come back in 2 months after that to recheck her vitamin D level... What doses of vitamin D issue on now? Ok to come for repeat. Lab is thin.

## 2016-01-31 NOTE — Telephone Encounter (Signed)
Called pt and she advised that she is on 50,000 units once a week of the Vitamin D. Pt scheduled lab appt for next week. Be advised.

## 2016-02-04 ENCOUNTER — Encounter: Payer: Self-pay | Admitting: Gastroenterology

## 2016-02-04 ENCOUNTER — Ambulatory Visit (INDEPENDENT_AMBULATORY_CARE_PROVIDER_SITE_OTHER): Payer: Medicare Other | Admitting: Family Medicine

## 2016-02-04 ENCOUNTER — Encounter: Payer: Self-pay | Admitting: Family Medicine

## 2016-02-04 ENCOUNTER — Other Ambulatory Visit (INDEPENDENT_AMBULATORY_CARE_PROVIDER_SITE_OTHER): Payer: Medicare Other

## 2016-02-04 VITALS — BP 130/80 | HR 58 | Temp 98.5°F | Wt 141.0 lb

## 2016-02-04 DIAGNOSIS — R739 Hyperglycemia, unspecified: Secondary | ICD-10-CM

## 2016-02-04 DIAGNOSIS — K219 Gastro-esophageal reflux disease without esophagitis: Secondary | ICD-10-CM

## 2016-02-04 DIAGNOSIS — I10 Essential (primary) hypertension: Secondary | ICD-10-CM

## 2016-02-04 DIAGNOSIS — E559 Vitamin D deficiency, unspecified: Secondary | ICD-10-CM

## 2016-02-04 LAB — VITAMIN D 25 HYDROXY (VIT D DEFICIENCY, FRACTURES): VITD: 48.37 ng/mL (ref 30.00–100.00)

## 2016-02-04 MED ORDER — RANITIDINE HCL 150 MG PO TABS: 150.0000 mg | ORAL_TABLET | Freq: Two times a day (BID) | ORAL | Status: DC

## 2016-02-04 NOTE — Assessment & Plan Note (Signed)
S: has lost 47 lbs. No reflux symptoms going from 40mg  prilosec to 20mg .  A/P: we will further reduce to zantac 150mg  BID. If controlled at follow up may reduce to nightly only.

## 2016-02-04 NOTE — Patient Instructions (Addendum)
Blood pressure looks great!   Wt Readings from Last 3 Encounters:  02/04/16 141 lb (63.957 kg)  01/21/16 143 lb (64.864 kg)  09/24/15 149 lb (67.586 kg)  You continue your amazing journey with healthy lifestyle choices. You are such an inspiration! On 03/26/15 your weight was 188  Change from prilosec 20mg  to zantac 150mg  twice a day  Skin looks great- continue eucerin  Try colace daily for stools. If still having issues perhaps try miralax 1 capful every other day

## 2016-02-04 NOTE — Assessment & Plan Note (Signed)
S: controlled on Propranolol 40mg  and valsartan-amlodipine 320-10mg  BP Readings from Last 3 Encounters:  02/04/16 130/80  01/21/16 120/72  09/24/15 124/72  A/P:Continue current meds:  Great control

## 2016-02-04 NOTE — Progress Notes (Signed)
Garret Reddish, MD  Subjective:  Felicia Perry is a 70 y.o. year old very pleasant female patient who presents for/with See problem oriented charting ROS- No chest pain or shortness of breath. No headache or blurry vision. Does have some chronic joint pains following with Dr. Tamala Julian  Past Medical History-  Patient Active Problem List   Diagnosis Date Noted  . Hyperglycemia 08/06/2015    Priority: High  . Vitamin D deficiency 08/20/2015    Priority: Medium  . Xerosis of skin 08/06/2015    Priority: Medium  . Hypersomnia with sleep apnea 03/26/2015    Priority: Medium  . Chronic pain syndrome 03/26/2015    Priority: Medium  . Hyperparathyroidism ([place]) 09/19/2014    Priority: Medium  . Insomnia 07/13/2014    Priority: Medium  . Depression 04/22/2007    Priority: Medium  . Essential hypertension 04/22/2007    Priority: Medium  . ASTHMA 04/22/2007    Priority: Medium  . Nonallopathic lesion of lumbosacral region 11/13/2014    Priority: Low  . Osteoarthritis of left lower extremity 10/17/2014    Priority: Low  . Chronic meniscal tear of knee 10/17/2014    Priority: Low  . Nonallopathic lesion of thoracic region 09/14/2014    Priority: Low  . Nonallopathic lesion-rib cage 08/21/2014    Priority: Low  . Tendinopathy of right rotator cuff 07/30/2014    Priority: Low  . Piriformis syndrome of right side 07/30/2014    Priority: Low  . Limited joint range of motion 07/13/2014    Priority: Low  . Rash 07/13/2014    Priority: Low  . GERD (gastroesophageal reflux disease) 02/25/2011    Priority: Low  . CERVICAL DISC DISORDER 10/10/2007    Priority: Low  . LOW BACK PAIN 07/01/2007    Priority: Low    Medications- reviewed and updated Current Outpatient Prescriptions  Medication Sig Dispense Refill  . acetaminophen (TYLENOL) 325 MG tablet Take 650 mg by mouth every 6 (six) hours as needed.    Marland Kitchen alprazolam (XANAX) 2 MG tablet TAKE 1/2 TO 1 TABLET AT BEDTIME AS NEEDED FOR  SLEEP 30 tablet 5  . amLODipine-valsartan (EXFORGE) 10-320 MG per tablet TAKE ONE TABLET EACH DAY 30 tablet 8  . levocetirizine (XYZAL) 5 MG tablet 1 tab every am 30 tablet 11  . propranolol (INDERAL) 40 MG tablet TAKE ONE TABLET BY MOUTH ONCE DAILY 30 tablet 5  . triamcinolone cream (KENALOG) 0.5 % Apply 1 application topically 2 (two) times daily. To affected areas. 30 g 3  . venlafaxine XR (EFFEXOR-XR) 75 MG 24 hr capsule TAKE ONE CAPSULE EACH DAY WITH BREAKFAST 90 capsule 1  . Vitamin D, Ergocalciferol, (DRISDOL) 50000 UNITS CAPS capsule Take 1 capsule (50,000 Units total) by mouth every 7 (seven) days. 10 capsule 1  . ranitidine (ZANTAC) 150 MG tablet Take 1 tablet (150 mg total) by mouth 2 (two) times daily. 60 tablet 11   No current facility-administered medications for this visit.    Objective: BP 130/80 mmHg  Pulse 58  Temp(Src) 98.5 F (36.9 C)  Wt 141 lb (63.957 kg) Gen: NAD, resting comfortably CV: RRR no murmurs rubs or gallops Lungs: CTAB no crackles, wheeze, rhonchi Abdomen: soft/nontender/nondistended/normal bowel sounds. No rebound or guarding.  Ext: no edema Skin: warm, dry Neuro: grossly normal, moves all extremities  Assessment/Plan:  Essential hypertension S: controlled on Propranolol 40mg  and valsartan-amlodipine 320-10mg  BP Readings from Last 3 Encounters:  02/04/16 130/80  01/21/16 120/72  09/24/15 124/72  A/P:Continue  current meds:  Great control   Hyperglycemia Lab Results  Component Value Date   HGBA1C 7.4* 02/08/2015  Had been on prednisone Lab Results  Component Value Date   HGBA1C 5.8 08/06/2015  After weight loss over 30 lbs. Now actually has lost 47 lbs. Near normal BMI. avoids sugar as much as possible. Bike riding Sunday 3-5 miles with group. Gym twice a week.  Continue efforts  GERD (gastroesophageal reflux disease) S: has lost 47 lbs. No reflux symptoms going from 40mg  prilosec to 20mg .  A/P: we will further reduce to zantac 150mg   BID. If controlled at follow up may reduce to nightly only.     Return in about 6 months (around 08/05/2016) for physical. in morning and we will update labs. Return precautions advised. Colace advised for some hard stools (has upcoming colonoscopy) if not effective can try miralax every other day.   Meds ordered this encounter  Medications  . ranitidine (ZANTAC) 150 MG tablet    Sig: Take 1 tablet (150 mg total) by mouth 2 (two) times daily.    Dispense:  60 tablet    Refill:  11

## 2016-02-04 NOTE — Assessment & Plan Note (Signed)
Lab Results  Component Value Date   HGBA1C 7.4* 02/08/2015  Had been on prednisone Lab Results  Component Value Date   HGBA1C 5.8 08/06/2015  After weight loss over 30 lbs. Now actually has lost 47 lbs. Near normal BMI. avoids sugar as much as possible. Bike riding Sunday 3-5 miles with group. Gym twice a week.  Continue efforts

## 2016-04-01 ENCOUNTER — Other Ambulatory Visit: Payer: Self-pay | Admitting: Family Medicine

## 2016-04-09 ENCOUNTER — Other Ambulatory Visit: Payer: Self-pay | Admitting: Family Medicine

## 2016-05-12 ENCOUNTER — Other Ambulatory Visit: Payer: Self-pay | Admitting: Family Medicine

## 2016-05-18 NOTE — Assessment & Plan Note (Addendum)
Has done remarkably well. We discussed icing regimen. We discussed which activities to do in which ones to potentially avoid. Impression is having some more stress. We will increase her Effexor to 112 mg. Do not feel comfortable with patient's age it 159 g daily. We will see how she responds in follow-up in 4 weeks.

## 2016-05-18 NOTE — Assessment & Plan Note (Signed)
Decision today to treat with OMT was based on Physical Exam  After verbal consent patient was treated with HVLA, ME techniques in thoracic and rib areas  Patient tolerated the procedure well with improvement in symptoms  Patient given exercises, stretches and lifestyle modifications  See medications in patient instructions if given  Patient will follow up in 3-4 months

## 2016-05-18 NOTE — Progress Notes (Signed)
[name] [address] [place], [place] [address] Phone: [phone] Subjective:     CC: back pain follow up. Chronic pain follow-up  [id number]  Felicia Perry is a 70 y.o. female coming in with complaint of chronic low back pain. Patient was found to have more of a spectrum of chronic pain syndrome with a piriformis syndrome. Patient was started on Effexor and has noticed significant improvement. Recently though has noticed that she has had some increasing discomfort recently. Patient has been trying to be active but finds it difficult secondary to the increasing pain. Had some mild increase in weight as well. Feels that she has plateaued at this time. Patient has had more stressed with her son who isn't opiate addict. Feels that that is giving her some trouble. Patient did increase her of Effexor to 150 mg but states that no side effect. Think she is feeling better and would like a higher dose.   Patient also is having more of a left knee pain.has a degenerative meniscal tear. Was last seen greater than 3 months ago and was given an injection at that time. Patient was to do home exercises for the chronic meniscal tear as well as the underlying moderate arthritis. Patient states With the results at this time.  Patient though does have some neck pain. Feels that there is a mass on the left side. An states that it is tender and movable. Once it to be evaluated.  Patient has had difficulty with the vitamin D as well.patient is been on once weekly vitamin D and 1 from 19-48.  Wt Readings from Last 3 Encounters:  02/04/16 141 lb (64 kg)  01/21/16 143 lb (64.9 kg)  09/24/15 149 lb (67.6 kg)   .   Past medical history, social, surgical and family history all reviewed in electronic medical record.   History of _+ANA following up with rheumatology   Review of Systems: No headache, visual changes, nausea, vomiting, diarrhea, constipation, dizziness,  abdominal pain, skin rash, fevers, chills, night sweats, weight loss, swollen lymph nodes, body aches, joint swelling, muscle aches, chest pain, shortness of breath, mood changes.   Objective  There were no vitals taken for this visit.  General: No apparent distress alert and oriented x3 mood and affect normal, dressed appropriately.  HEENT: Pupils equal, extraocular movements intact  Respiratory: Patient does have some shallow breathing as well as some expiratory wheezing Cardiovascular: No lower extremity edema, non tender, no erythema  Skin: Warm dry intact with no signs of infection or rash on extremities or on axial skeleton.  Abdomen: Soft nontender  Neuro: Cranial nerves II through XII are intact, neurovascularly intact in all extremities with 2+ DTRs and 2+ pulses.  Lymph: No lymphadenopathy of posterior or anterior cervical chain or axillae bilaterally.  Gait normal with good balance and coordination MSK:  Non tender with full range of motion and good stability and symmetric strength and tone of elbows, wrist, hip and ankles bilaterally. Mild osteoarthritic changes of multiple joints. Knee: Left Mild arthritic changes nontender ROM full in flexion and extension and lower leg rotation. Ligaments with solid consistent endpoints including ACL, PCL, LCL, MCL. Negative Mcmurray's, Apley's, and Thessalonian tests.  painful patellar compression. Patellar glide with minimalcrepitus. Patellar and quadriceps tendons unremarkable. Hamstring and quadriceps strength is normal.  Contralateral knee mild arthritic changes but no pain Back exam shows the patient is very minimally tender over the sacroiliac joint. Mild discomfort at the paraspinal musculature  of the thoracic spine. Negative straight leg test bilaterally. This process tenderness  Neck exam shows the patient does have what appears to be a lymph node. Fluctuant. Small, less than a dime in diameter. Freely movable. Painful to  touch.  Osteopathic findings of  Cervical C2 flexed rotated and side bent right  Thoracic T2 extended rotated and side bent right inhaled second rib T5 extended rotated and side bent left L2 flexed rotated and side bent right   Impression and Recommendations:     This case required medical decision making of moderate complexity.

## 2016-05-19 ENCOUNTER — Encounter: Payer: Self-pay | Admitting: Family Medicine

## 2016-05-19 ENCOUNTER — Ambulatory Visit (INDEPENDENT_AMBULATORY_CARE_PROVIDER_SITE_OTHER): Payer: Medicare Other | Admitting: Family Medicine

## 2016-05-19 VITALS — BP 122/78 | HR 64 | Wt 150.0 lb

## 2016-05-19 DIAGNOSIS — R599 Enlarged lymph nodes, unspecified: Secondary | ICD-10-CM | POA: Insufficient documentation

## 2016-05-19 DIAGNOSIS — M9908 Segmental and somatic dysfunction of rib cage: Secondary | ICD-10-CM

## 2016-05-19 DIAGNOSIS — R591 Generalized enlarged lymph nodes: Secondary | ICD-10-CM

## 2016-05-19 DIAGNOSIS — M9902 Segmental and somatic dysfunction of thoracic region: Secondary | ICD-10-CM

## 2016-05-19 DIAGNOSIS — M9903 Segmental and somatic dysfunction of lumbar region: Secondary | ICD-10-CM | POA: Diagnosis not present

## 2016-05-19 DIAGNOSIS — G894 Chronic pain syndrome: Secondary | ICD-10-CM | POA: Diagnosis not present

## 2016-05-19 DIAGNOSIS — M999 Biomechanical lesion, unspecified: Secondary | ICD-10-CM

## 2016-05-19 MED ORDER — VENLAFAXINE HCL ER 37.5 MG PO CP24: 112.5000 mg | ORAL_CAPSULE | Freq: Every day | ORAL | 1 refills | Status: DC

## 2016-05-19 NOTE — Patient Instructions (Signed)
Good to see you We will increase the effexor.  We will watch the little lymph node.  Less than a dime and will be fine.  Ice is your friend Try yoga 1-2 times a week 2 tennis ball in a tube sock and lay on them when tight or stress Keep working on the posture See me again in 4 weeks.

## 2016-05-19 NOTE — Assessment & Plan Note (Signed)
Posterior chain on the left side. Freely movable and painful. Patient is notices it for weeks. We will continue to monitor. Do not feel that further workup is necessary at this time secondary to the small size. Less than half a dime. Patient in follow-up if any enlargement we'll consider ultrasound at that time. No other significant findings. Would also have to get laboratory workup at that time the patient's most recent labs were unremarkable.

## 2016-06-16 ENCOUNTER — Ambulatory Visit (INDEPENDENT_AMBULATORY_CARE_PROVIDER_SITE_OTHER): Payer: Medicare Other | Admitting: Family Medicine

## 2016-06-16 ENCOUNTER — Encounter: Payer: Self-pay | Admitting: Family Medicine

## 2016-06-16 VITALS — BP 122/78 | HR 63 | Wt 149.0 lb

## 2016-06-16 DIAGNOSIS — R599 Enlarged lymph nodes, unspecified: Secondary | ICD-10-CM

## 2016-06-16 DIAGNOSIS — M9908 Segmental and somatic dysfunction of rib cage: Secondary | ICD-10-CM

## 2016-06-16 DIAGNOSIS — M5441 Lumbago with sciatica, right side: Secondary | ICD-10-CM | POA: Diagnosis not present

## 2016-06-16 DIAGNOSIS — M9903 Segmental and somatic dysfunction of lumbar region: Secondary | ICD-10-CM | POA: Diagnosis not present

## 2016-06-16 DIAGNOSIS — G8929 Other chronic pain: Secondary | ICD-10-CM

## 2016-06-16 DIAGNOSIS — R591 Generalized enlarged lymph nodes: Secondary | ICD-10-CM

## 2016-06-16 DIAGNOSIS — M9902 Segmental and somatic dysfunction of thoracic region: Secondary | ICD-10-CM | POA: Diagnosis not present

## 2016-06-16 DIAGNOSIS — M999 Biomechanical lesion, unspecified: Secondary | ICD-10-CM

## 2016-06-16 NOTE — Assessment & Plan Note (Signed)
Patient does have bone on the left side of her neck. Still approximately the size of the time. We will check again in 6-8 weeks. If any enlargement we will get ultrasound.

## 2016-06-16 NOTE — Assessment & Plan Note (Signed)
Decision today to treat with OMT was based on Physical Exam  After verbal consent patient was treated with HVLA, ME techniques in thoracic and rib areas  Patient tolerated the procedure well with improvement in symptoms  Patient given exercises, stretches and lifestyle modifications  See medications in patient instructions if given  Patient will follow up in 6-8 weeks                  

## 2016-06-16 NOTE — Assessment & Plan Note (Signed)
Patient is doing remarkably well. We'll continue to monitor closely. Has had difficulty with hyperparathyroidism before and we will continue to monitor make sure patient does not any worsening. Has responded well to once weekly vitamin D as well as the Effexor with some underlying chronic pain syndrome. Patient will come back and see me again in 6-8 weeks.

## 2016-06-16 NOTE — Progress Notes (Signed)
[name] [address], [place] [address] Phone: [phone] Subjective:     CC: back pain follow up. Chronic pain follow-up  [id number]  Felicia Perry is a 70 y.o. female coming in with complaint of chronic low back pain. Patient was found to have more of a spectrum of chronic pain syndrome with a piriformis syndrome. Patient was started on Effexor and has noticed significant improvement.Continues to be very active. Patient was on vacation and did do some paddle boarding even. Some mild discomfort but nothing severe. No radiation down the legs. Feels like she is doing well with the Effexor. Did increase her dose him.  Patient also is having more of a left knee pain.has a degenerative meniscal tear. Still doing well after injections greater than 4 months ago..  Patient though does have some neck pain. Feels that there is a mass on the left side. Appeared to be more of the size of a dime. Patient notices no significant change but is wondering if anything else to be done.  Patient has had difficulty with the vitamin D as well.patient is been on once weekly vitamin D and 1 from 19-48.  Wt Readings from Last 3 Encounters:  06/16/16 149 lb (67.6 kg)  05/19/16 150 lb (68 kg)  02/04/16 141 lb (64 kg)   .   Past medical history, social, surgical and family history all reviewed in electronic medical record.   History of _+ANA following up with rheumatology   Review of Systems: No headache, visual changes, nausea, vomiting, diarrhea, constipation, dizziness, abdominal pain, skin rash, fevers, chills, night sweats, weight loss, swollen lymph nodes, body aches, joint swelling, muscle aches, chest pain, shortness of breath, mood changes.   Objective  Blood pressure 122/78, pulse 63, weight 149 lb (67.6 kg), SpO2 95 %.  General: No apparent distress alert and oriented x3 mood and affect normal, dressed appropriately.  HEENT: Pupils equal, extraocular  movements intact  Respiratory: Patient does have some shallow breathing as well as some expiratory wheezing Cardiovascular: No lower extremity edema, non tender, no erythema  Skin: Warm dry intact with no signs of infection or rash on extremities or on axial skeleton.  Abdomen: Soft nontender  Neuro: Cranial nerves II through XII are intact, neurovascularly intact in all extremities with 2+ DTRs and 2+ pulses.  Lymph: No lymphadenopathy of posterior or anterior cervical chain or axillae bilaterally.  Gait normal with good balance and coordination MSK:  Non tender with full range of motion and good stability and symmetric strength and tone of elbows, wrist, hip and ankles bilaterally. Mild osteoarthritic changes of multiple joints. Knee: Left Mild arthritic changes nontender ROM full in flexion and extension and lower leg rotation. Ligaments with solid consistent endpoints including ACL, PCL, LCL, MCL. Negative Mcmurray's, Apley's, and Thessalonian tests.  painful patellar compression. Patellar glide with minimal crepitus. Patellar and quadriceps tendons unremarkable. Hamstring and quadriceps strength is normal.  Contralateral knee mild arthritic changes but no pain  Back exam shows the patient is very minimally tender over the sacroiliac joint. Mild discomfort at the paraspinal musculature of the thoracic spine.   Neck exam shows the patient does have what appears to be a lymph node. Fluctuant. Continues to be less than the size of a dime. No change.  Osteopathic findings of  Cervical C2 flexed rotated and side bent right  Thoracic T2 extended rotated and side bent right inhaled second rib T5 extended rotated and side bent left  L2 flexed rotated and side bent right   Impression and Recommendations:     This case required medical decision making of moderate complexity.

## 2016-06-16 NOTE — Patient Instructions (Signed)
Good to see you  Keep it up  We will continue the same dose of the effexor We will continue to monitor the lymph node.  No larger then it was before, if worse call me though Stay active which I know you were See me again in 6-8 weeks.

## 2016-06-22 DIAGNOSIS — M25561 Pain in right knee: Secondary | ICD-10-CM | POA: Diagnosis not present

## 2016-06-22 DIAGNOSIS — M545 Low back pain: Secondary | ICD-10-CM | POA: Diagnosis not present

## 2016-06-22 DIAGNOSIS — M19042 Primary osteoarthritis, left hand: Secondary | ICD-10-CM | POA: Diagnosis not present

## 2016-06-22 DIAGNOSIS — M19041 Primary osteoarthritis, right hand: Secondary | ICD-10-CM | POA: Diagnosis not present

## 2016-06-22 DIAGNOSIS — M25562 Pain in left knee: Secondary | ICD-10-CM | POA: Diagnosis not present

## 2016-07-14 ENCOUNTER — Other Ambulatory Visit: Payer: Self-pay

## 2016-07-14 MED ORDER — ALPRAZOLAM 2 MG PO TABS: ORAL_TABLET | ORAL | 5 refills | Status: DC

## 2016-07-28 ENCOUNTER — Ambulatory Visit: Payer: Medicare Other | Admitting: Family Medicine

## 2016-08-04 ENCOUNTER — Ambulatory Visit (INDEPENDENT_AMBULATORY_CARE_PROVIDER_SITE_OTHER): Payer: Medicare Other | Admitting: Family Medicine

## 2016-08-04 ENCOUNTER — Encounter: Payer: Self-pay | Admitting: Gastroenterology

## 2016-08-04 ENCOUNTER — Encounter: Payer: Self-pay | Admitting: Family Medicine

## 2016-08-04 ENCOUNTER — Other Ambulatory Visit: Payer: Self-pay | Admitting: Family Medicine

## 2016-08-04 VITALS — BP 136/72 | HR 71 | Temp 98.3°F | Ht 61.0 in | Wt 151.4 lb

## 2016-08-04 DIAGNOSIS — G4700 Insomnia, unspecified: Secondary | ICD-10-CM

## 2016-08-04 DIAGNOSIS — Z7289 Other problems related to lifestyle: Secondary | ICD-10-CM | POA: Diagnosis not present

## 2016-08-04 DIAGNOSIS — Z23 Encounter for immunization: Secondary | ICD-10-CM

## 2016-08-04 DIAGNOSIS — R0789 Other chest pain: Secondary | ICD-10-CM

## 2016-08-04 DIAGNOSIS — I10 Essential (primary) hypertension: Secondary | ICD-10-CM | POA: Diagnosis not present

## 2016-08-04 DIAGNOSIS — R739 Hyperglycemia, unspecified: Secondary | ICD-10-CM | POA: Diagnosis not present

## 2016-08-04 DIAGNOSIS — K219 Gastro-esophageal reflux disease without esophagitis: Secondary | ICD-10-CM

## 2016-08-04 DIAGNOSIS — W273XXA Contact with needle (sewing), initial encounter: Secondary | ICD-10-CM

## 2016-08-04 DIAGNOSIS — Z1231 Encounter for screening mammogram for malignant neoplasm of breast: Secondary | ICD-10-CM

## 2016-08-04 DIAGNOSIS — F329 Major depressive disorder, single episode, unspecified: Secondary | ICD-10-CM

## 2016-08-04 DIAGNOSIS — F32A Depression, unspecified: Secondary | ICD-10-CM

## 2016-08-04 DIAGNOSIS — E213 Hyperparathyroidism, unspecified: Secondary | ICD-10-CM | POA: Diagnosis not present

## 2016-08-04 DIAGNOSIS — R55 Syncope and collapse: Secondary | ICD-10-CM

## 2016-08-04 DIAGNOSIS — Z0001 Encounter for general adult medical examination with abnormal findings: Secondary | ICD-10-CM

## 2016-08-04 DIAGNOSIS — J45909 Unspecified asthma, uncomplicated: Secondary | ICD-10-CM

## 2016-08-04 DIAGNOSIS — G894 Chronic pain syndrome: Secondary | ICD-10-CM

## 2016-08-04 DIAGNOSIS — Z0000 Encounter for general adult medical examination without abnormal findings: Secondary | ICD-10-CM

## 2016-08-04 DIAGNOSIS — Z1211 Encounter for screening for malignant neoplasm of colon: Secondary | ICD-10-CM

## 2016-08-04 DIAGNOSIS — IMO0002 Reserved for concepts with insufficient information to code with codable children: Secondary | ICD-10-CM

## 2016-08-04 LAB — COMPREHENSIVE METABOLIC PANEL
ALBUMIN: 4.1 g/dL (ref 3.5–5.2)
ALK PHOS: 90 U/L (ref 39–117)
ALT: 10 U/L (ref 0–35)
AST: 14 U/L (ref 0–37)
BILIRUBIN TOTAL: 0.5 mg/dL (ref 0.2–1.2)
BUN: 16 mg/dL (ref 6–23)
CALCIUM: 9.6 mg/dL (ref 8.4–10.5)
CO2: 31 mEq/L (ref 19–32)
CREATININE: 0.82 mg/dL (ref 0.40–1.20)
Chloride: 104 mEq/L (ref 96–112)
GFR: 73.23 mL/min (ref >60.00)
Glucose, Bld: 109 mg/dL — ABNORMAL HIGH (ref 70–99)
Potassium: 4.3 mEq/L (ref 3.5–5.1)
Sodium: 142 mEq/L (ref 135–145)
TOTAL PROTEIN: 7 g/dL (ref 6.0–8.3)

## 2016-08-04 LAB — CBC WITH DIFFERENTIAL/PLATELET
BASOS ABS: 0.1 10*3/uL (ref 0.0–0.1)
Basophils Relative: 1.2 % (ref 0.0–3.0)
EOS ABS: 0.2 10*3/uL (ref 0.0–0.7)
Eosinophils Relative: 3 % (ref 0.0–5.0)
HEMATOCRIT: 42.6 % (ref 36.0–46.0)
HEMOGLOBIN: 14.5 g/dL (ref 12.0–15.0)
LYMPHS PCT: 26.8 % (ref 12.0–46.0)
Lymphs Abs: 2.1 10*3/uL (ref 0.7–4.0)
MCHC: 34.1 g/dL (ref 30.0–36.0)
MCV: 87.9 fl (ref 78.0–100.0)
MONOS PCT: 8.6 % (ref 3.0–12.0)
Monocytes Absolute: 0.7 10*3/uL (ref 0.1–1.0)
NEUTROS ABS: 4.8 10*3/uL (ref 1.4–7.7)
Neutrophils Relative %: 60.4 % (ref 43.0–77.0)
PLATELETS: 314 10*3/uL (ref 150.0–400.0)
RBC: 4.85 Mil/uL (ref 3.87–5.11)
RDW: 12.6 % (ref 11.5–15.5)
WBC: 7.9 10*3/uL (ref 4.0–10.5)

## 2016-08-04 LAB — VITAMIN D 25 HYDROXY (VIT D DEFICIENCY, FRACTURES): VITD: 51.88 ng/mL (ref 30.00–100.00)

## 2016-08-04 LAB — HEMOGLOBIN A1C: HEMOGLOBIN A1C: 5.6 % (ref 4.6–6.5)

## 2016-08-04 NOTE — Progress Notes (Signed)
Pre visit review using our clinic review tool, if applicable. No additional management support is needed unless otherwise documented below in the visit note. 

## 2016-08-04 NOTE — Assessment & Plan Note (Signed)
continuing xanax for sleep and working well. She can use pre MRI as well

## 2016-08-04 NOTE — Assessment & Plan Note (Addendum)
Hyperglycemia in past- but has lost significant weight- update a1c from prior 5.8 Lab Results  Component Value Date   HGBA1C 5.6 08/04/2016  Can do CBG testing at this point and if elevated consider a1c again

## 2016-08-04 NOTE — Assessment & Plan Note (Signed)
Doing well on effexor 112mg . No SI.

## 2016-08-04 NOTE — Progress Notes (Signed)
Phone: [phone]  Subjective:  Patient presents today for their annual physical. Chief complaint-noted.   See problem oriented charting- ROS- full  review of systems was completed and negative except for: chest pain and syncope as noted below for problem oriented work up. Also ROS noted under those sections  The following were reviewed and entered/updated in epic: Past Medical History:  Diagnosis Date  . Adenomatous polyp 11/23/2005  . Anxiety   . Arthritis   . Asthma   . Diverticulosis   . DIVERTICULOSIS, COLON 04/22/2007       . GERD (gastroesophageal reflux disease)   . History of shingles   . Hypertension   . Internal hemorrhoids   . Low back pain   . PUD (peptic ulcer disease)    Patient Active Problem List   Diagnosis Date Noted  . Syncope 08/04/2016    Priority: High  . Vitamin D deficiency 08/20/2015    Priority: Medium  . Hypersomnia with sleep apnea 03/26/2015    Priority: Medium  . Chronic pain syndrome 03/26/2015    Priority: Medium  . Hyperparathyroidism ([place]) 09/19/2014    Priority: Medium  . Insomnia 07/13/2014    Priority: Medium  . GERD (gastroesophageal reflux disease) 02/25/2011    Priority: Medium  . Depression 04/22/2007    Priority: Medium  . Essential hypertension 04/22/2007    Priority: Medium  . Asthma 04/22/2007    Priority: Medium  . Hyperglycemia 08/06/2015    Priority: Low  . Xerosis of skin 08/06/2015    Priority: Low  . Nonallopathic lesion of lumbosacral region 11/13/2014    Priority: Low  . Osteoarthritis of left lower extremity 10/17/2014    Priority: Low  . Chronic meniscal tear of knee 10/17/2014    Priority: Low  . Nonallopathic lesion of thoracic region 09/14/2014    Priority: Low  . Nonallopathic lesion-rib cage 08/21/2014    Priority: Low  . Tendinopathy of right rotator cuff 07/30/2014    Priority: Low  . Piriformis syndrome of right side 07/30/2014    Priority: Low  . Limited joint range of motion 07/13/2014     Priority: Low  . Rash 07/13/2014    Priority: Low  . CERVICAL DISC DISORDER 10/10/2007    Priority: Low  . LOW BACK PAIN 07/01/2007    Priority: Low  . Enlarged lymph nodes 05/19/2016   Past Surgical History:  Procedure Laterality Date  . 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 cataract surgery      Family History  Problem Relation Age of Onset  . Heart disease Mother   . Hypertension Mother   . Diabetes Mother   . COPD Father   . Colon cancer Neg Hx     Medications- reviewed and updated Current Outpatient Prescriptions  Medication Sig Dispense Refill  . acetaminophen (TYLENOL) 325 MG tablet Take 650 mg by mouth every 6 (six) hours as needed.    Marland Kitchen alprazolam (XANAX) 2 MG tablet TAKE 1/2 TO 1 TABLET AT BEDTIME AS NEEDED FOR SLEEP 30 tablet 5  . amLODipine-valsartan (EXFORGE) 10-320 MG tablet TAKE ONE TABLET EACH DAY 30 tablet 5  . levocetirizine (XYZAL) 5 MG tablet TAKE ONE TABLET EVERY MORNING 30 tablet 11  . propranolol (INDERAL) 40 MG tablet TAKE ONE TABLET BY MOUTH ONCE DAILY 30 tablet 5  . ranitidine (ZANTAC) 150 MG tablet Take 1 tablet (150 mg total) by mouth 2 (two) times daily. 60 tablet 11  .  triamcinolone cream (KENALOG) 0.5 % Apply 1 application topically 2 (two) times daily. To affected areas. 30 g 3  . venlafaxine XR (EFFEXOR-XR) 37.5 MG 24 hr capsule Take 3 capsules (112.5 mg total) by mouth daily with breakfast. 90 capsule 1  . Vitamin D, Ergocalciferol, (DRISDOL) 50000 units CAPS capsule TAKE 1 CAPSULE EVERY 7 DAYS 10 capsule 1   No current facility-administered medications for this visit.     Allergies-reviewed and updated Allergies  Allergen Reactions  . Celexa [Citalopram Hydrobromide]     psychosis  . Cephalosporins     REACTION: tongue swelling  . Clarithromycin     REACTION: blisters in mouth  . Clarithromycin   . Doxycycline Hyclate     REACTION: nausea, vomiting  . Levofloxacin     REACTION: tongue swells  .  Penicillins     REACTION: per patient causes rash,hives  . Zolpidem Tartrate     REACTION: difficulty with concentration    Social History   Social History  . Marital status: Divorced    Spouse name: N/A  . Number of children: N/A  . Years of education: N/A   Occupational History  . Cleaning    Social History Main Topics  . Smoking status: Former Research scientist (life sciences)  . Smokeless tobacco: Never Used  . Alcohol use No  . Drug use: No  . Sexual activity: Yes   Other Topics Concern  . Not on file   Social History Narrative   Divorced for 38 years in 2016. 2 sons.  2 granddaughters.       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       Hobbies: watch tv-survivor, dancing with the stars, enjoy alone time. Best friend [name] patient of Dr. Yong Channel. Enjoys work, time on Teaching laboratory technician.     Objective: BP 136/72   Pulse 71   Temp 98.3 F (36.8 C) (Oral)   Ht 5\' 1"  (1.549 m)   Wt 151 lb 6.4 oz (68.7 kg)   SpO2 97%   BMI 28.61 kg/m  Gen: NAD, resting comfortably HEENT: Mucous membranes are moist. Oropharynx normal Neck: no thyromegaly CV: RRR no murmurs rubs or gallops No chest wall tenderness.  Lungs: CTAB no crackles, wheeze, rhonchi Abdomen: soft/nontender/nondistended/normal bowel sounds. No rebound or guarding.  Ext: no edema Skin: warm, dry Neuro: grossly normal, moves all extremities, PERRLA, did not complete full neuro exam  Declines after discussion pelvic and breast exam  EKG: Rate 66, sinus rhythm, normal axis, normal intervals, no hypertrophy- P wave duration <0.12s in II, no st or t wave changes  EKG through IT is being entered into patient chart- pending at present  Assessment/Plan:  70 y.o. female presenting for annual physical.  Health Maintenance counseling: 1. Anticipatory guidance: Patient counseled regarding regular dental exams, eye exams (asked her to get follow up exam), wearing seatbelts.  2. Risk factor reduction:   Advised patient of need for regular exercise and diet rich and fruits and vegetables to reduce risk of heart attack and stroke. Bike riding each Sunday. Stopped bike riding after syncopal episode. Also slowed down on exercise until evaluation here 3. Immunizations/screenings/ancillary studies Immunization History  Administered Date(s) Administered  . Influenza Split 09/21/2011, 07/07/2012  . Influenza Whole 08/10/2008, 07/30/2010  . Influenza, High Dose Seasonal PF 08/04/2016  . Influenza,inj,Quad PF,36+ Mos 07/21/2013, 08/06/2015  . Influenza-Unspecified 07/10/2014  . Pneumococcal Conjugate-13 01/05/2014  . Pneumococcal Polysaccharide-23 02/08/2015  . Td 10/26/2001  .  Tetanus 01/05/2014   Health Maintenance Due  Topic Date Due  . Hepatitis C Screening - today May 28, 1946  . ZOSTAVAX - wants to wait until after syncope workup 06/22/2006  . COLONOSCOPY - refer today 03/31/2016  . INFLUENZA VACCINE - given today 05/26/2016  . MAMMOGRAM - gave handout 07/30/2016   4. Cervical cancer screening- passed age based screening recommendations 5. Breast cancer screening-  breast exam does monthly and declines here today and mammogram 07/30/14- needs to be updated 6. Colon cancer screening - 03/2011 with 5 year repeat advised due to adenoma- refer today    Status of chronic or acute concerns   Needle stick - son is an addict and she got pricked by one of his needles In her trunk. She is 100% sure immunized against HBV. Will test HCV and HIV today.   Chronic pain- following with Dr. Tamala Julian- they complete osteopathic manipulation which helps. effexor helps some and was titrated up 3 months ago by Dr. Tamala Julian  Lymphadenopathy- posterior chain left side of neck. [place] planned with Dr. Tamala Julian next week since persists- to be honest I have a hard time appreciating the lymphadenopathy. He may ultrasound the node and consider further workup- will defer for now as he has been following.   Advised AWV with our  nurse- has been getting a lot of calls from [facility] and prefers to do in offic ethan have someone in her home.   Hyperglycemia Hyperglycemia in past- but has lost significant weight- update a1c from prior 5.8 Lab Results  Component Value Date   HGBA1C 5.6 08/04/2016  Can do CBG testing at this point and if elevated consider a1c again  Hyperparathyroidism prior on calcitriol now off and calcium has been ok as long as off hctz. Will update calcium today (not elevated) and pending pth. Also on 50k vitamin D per week and vitamin D level over 50k- is going to return to Dr. Cruzita Lederer- to call today for follow up.   Asthma doing well without treatment- in winter needs symbicort  Insomnia continuing xanax for sleep and working well. She can use pre MRI as well  Depression Doing well on effexor 112mg . No SI.   Essential hypertension HTN- controlled on proparnolol 40mg  and valsartan-amlodipine 320-10mg  in past. Avoid HCTZ BP Readings from Last 3 Encounters:  08/04/16 136/72  06/16/16 122/78  05/19/16 122/78     GERD (gastroesophageal reflux disease) stable on prilosec in past- now on zantac once in AM and doing well. I do wonder if this could be cause of her CP but not always around mealtime or with certain foods.   Syncope Atypical chest pain S: 2 weeks ago out in the yard with granddaughter and playing with puppies and felt lightheaded  And headed back to the house, legs felt weak and ended up passing out and hitting her head on the door. No headache afterward. Granddaaughter went right to her and by the time she got to her patient was waking up already so under 10 seconds probably.   Prior to that was coming down the stairs one morning opening door to go to work and legs felt weak again after feeling lightheaded. [place] [address] backwards after passing out and did not hit her head. Woke up very soon after- knows this because was not late for work.   Before both episodes had some wavy lines in front  of left eye.   Also during this timeframe has had pressure in her central chest intermittently 2-3 x times  a week can be resting or exerting herself. Though hard to say if worse with exertion or better with rest. Goes away on its own within minutes. No shortness of breath left arm or neck pain or nausea with these events. Not lightheaded at these times A/P: 70 year old with controlled hypertension with new onset syncope and chest pain for about the last month. Events do not happen at same time. Will get Echocardiogram to rule out structural disease. To rule out coronary disease will get stress test though EKG reassuring- I doubt this ischemia but does have risk factors (also low HDL). She also has weakness in legs before syncopal episodes and hit her head with one episode- get MR brain to rule out central cause and rule out bleed after fall though no lingering headache. Depending on above workup- and if has recurrent episodes may also get holter monitor. Bradycardic event on propranolol certainly possible.  She is to report immediately to [place] if recurrent issues. Doubt PE without SOB and with complete recovery after events. TIA possible but would be less likely to cause true syncope I would not think. She will also see eye doctor given changes in vision with episodes  follow up depends on workup above but usually sees [place] every 6-12 months.   Orders Placed This Encounter  Procedures  . MR Brain W Wo Contrast    Standing Status:   Future    Standing Expiration Date:   10/04/2017    Order Specific Question:   If indicated for the ordered procedure, I authorize the administration of contrast media per Radiology protocol    Answer:   Yes    Order Specific Question:   Reason for Exam (SYMPTOM  OR DIAGNOSIS REQUIRED)    Answer:   2 syncopal episodes. Hit head after second episode in forehead. no lingering headache.    Order Specific Question:   Preferred imaging location?    Answer:   [facility]  (table limit-550lbs)    Order Specific Question:   What is the patient's sedation requirement?    Answer:   Oral Sedation    Comments:   has xanax- take 1 prior to visit    Order Specific Question:   Does the patient have a pacemaker or implanted devices?    Answer:   No  . Flu vaccine HIGH DOSE PF  . CBC with Differential/Platelet  . Comprehensive metabolic panel    [facility]  . PTH, Intact and Calcium  . VITAMIN D 25 Hydroxy (Vit-D Deficiency, Fractures)    [place]  . Hepatitis C antibody, reflex    [facility]  . HIV antibody  . Hemoglobin A1c    [place]  . Ambulatory referral to Gastroenterology    Referral Priority:   Routine    Referral Type:   Consultation    Referral Reason:   Specialty Services Required    Number of Visits Requested:   1  . Myocardial Perfusion Imaging    Standing Status:   Future    Standing Expiration Date:   08/04/2017    Order Specific Question:   Where should this test be performed    Answer:   [facility])    Order Specific Question:   Type of stress    Answer:   Exercise    Order Specific Question:   Patient weight in lbs    Answer:   151  . EKG 12-Lead    Order Specific Question:  Where should this test be performed    Answer:   Other  . ECHOCARDIOGRAM COMPLETE    Standing Status:   Future    Standing Expiration Date:   11/04/2017    Order Specific Question:   Where should this test be performed    Answer:   [facility])    Order Specific Question:   Does the patient weigh less than or greater than 250 lbs?    Answer:   Patient weighs less than 250 lbs    Order Specific Question:   Complete or Limited study?    Answer:   Complete    Order Specific Question:   With Image Enhancing Agent or without Image Enhancing Agent?    Answer:   With Image Enhancing Agent    Order Specific Question:   Reason for exam-Echo    Answer:   Syncope  780.2 / R55    Meds ordered this encounter  Medications  .  traMADol (ULTRAM) 50 MG tablet    Sig: Take 50 mg by mouth 2 (two) times daily.   Return precautions advised.   Garret Reddish, MD

## 2016-08-04 NOTE — Assessment & Plan Note (Addendum)
Atypical chest pain S: 2 weeks ago out in the yard with granddaughter and playing with puppies and felt lightheaded  And headed back to the house, legs felt weak and ended up passing out and hitting her head on the door. No headache afterward. Granddaaughter went right to her and by the time she got to her patient was waking up already so under 10 seconds probably.   Prior to that was coming down the stairs one morning opening door to go to work and legs felt weak again after feeling lightheaded. Golden [address] backwards after passing out and did not hit her head. Woke up very soon after- knows this because was not late for work.   Before both episodes had some wavy lines in front of left eye.   Also during this timeframe has had pressure in her central chest intermittently 2-3 x times a week can be resting or exerting herself. Though hard to say if worse with exertion or better with rest. Goes away on its own within minutes. No shortness of breath left arm or neck pain or nausea with these events. Not lightheaded at these times A/P: 70 year old with controlled hypertension with new onset syncope and chest pain for about the last month. Events do not happen at same time. Will get Echocardiogram to rule out structural disease. To rule out coronary disease will get stress test though EKG reassuring- I doubt this ischemia but does have risk factors (also low HDL). She also has weakness in legs before syncopal episodes and hit her head with one episode- get MR brain to rule out central cause and rule out bleed after fall though no lingering headache. Depending on above workup- and if has recurrent episodes may also get holter monitor. Bradycardic event on propranolol certainly possible.  She is to report immediately to [place] if recurrent issues. Doubt PE without SOB and with complete recovery after events. TIA possible but would be less likely to cause true syncope I would not think. She will also see eye doctor given  changes in vision with episodes

## 2016-08-04 NOTE — Assessment & Plan Note (Signed)
stable on prilosec in past- now on zantac once in AM and doing well. I do wonder if this could be cause of her CP but not always around mealtime or with certain foods.

## 2016-08-04 NOTE — Patient Instructions (Addendum)
Schedule follow up with Dr. Cruzita Lederer  We will call you within a week about your referral to GI. If you do not hear within 2 weeks, give [place] a call.   Labs before you leave  We will call you within a week about your referral to echocardiogram and stress test and MRI (before MRI may take a xanax 30 minutes before- have someone drive you). If you do not hear within 2 weeks, give [place] a call. If you have another episode we need to see you ASAP  Get your mammogram  I would also like for you to sign up for an annual wellness visit with our nurse, Manuela Schwartz, who specializes in the annual wellness exam. This is a free benefit under medicare that may help [place] find additional ways to help you. Some highlights are reviewing medications, lifestyle, and doing a dementia screen.    BP recheck

## 2016-08-04 NOTE — Assessment & Plan Note (Signed)
doing well without treatment- in winter needs symbicort

## 2016-08-04 NOTE — Assessment & Plan Note (Signed)
HTN- controlled on proparnolol 40mg  and valsartan-amlodipine 320-10mg  in past. Avoid HCTZ BP Readings from Last 3 Encounters:  08/04/16 136/72  06/16/16 122/78  05/19/16 122/78

## 2016-08-04 NOTE — Assessment & Plan Note (Signed)
prior on calcitriol now off and calcium has been ok as long as off hctz. Will update calcium today (not elevated) and pending pth. Also on 50k vitamin D per week and vitamin D level over 50k- is going to return to Dr. Cruzita Lederer- to call today for follow up.

## 2016-08-05 LAB — PTH, INTACT AND CALCIUM
CALCIUM: 9.3 mg/dL (ref 8.6–10.4)
PTH: 30 pg/mL (ref 14–64)

## 2016-08-05 LAB — HIV ANTIBODY (ROUTINE TESTING W REFLEX): HIV: NONREACTIVE

## 2016-08-05 LAB — HEPATITIS C ANTIBODY: HCV AB: NEGATIVE

## 2016-08-06 ENCOUNTER — Telehealth (HOSPITAL_COMMUNITY): Payer: Self-pay | Admitting: *Deleted

## 2016-08-06 DIAGNOSIS — R55 Syncope and collapse: Secondary | ICD-10-CM

## 2016-08-06 NOTE — Telephone Encounter (Signed)
Patient given detailed instructions per Myocardial Perfusion Study Information Sheet for the test on 08/11/16 at 7:30. Patient notified to arrive 15 minutes early and that it is imperative to arrive on time for appointment to keep from having the test rescheduled.  If you need to cancel or reschedule your appointment, please call the office within 24 hours of your appointment. Failure to do so may result in a cancellation of your appointment, and a $50 no show fee. Patient verbalized understanding.Felicia Perry

## 2016-08-06 NOTE — Telephone Encounter (Signed)
Left message on voicemail in reference to upcoming appointment scheduled for 08/11/16. Phone number given for a call back so details instructions can be given. Felicia Perry

## 2016-08-07 ENCOUNTER — Ambulatory Visit (HOSPITAL_COMMUNITY): Payer: Medicare Other | Attending: Cardiology

## 2016-08-07 ENCOUNTER — Other Ambulatory Visit: Payer: Self-pay

## 2016-08-07 DIAGNOSIS — I10 Essential (primary) hypertension: Secondary | ICD-10-CM | POA: Insufficient documentation

## 2016-08-07 DIAGNOSIS — Z87891 Personal history of nicotine dependence: Secondary | ICD-10-CM | POA: Insufficient documentation

## 2016-08-07 DIAGNOSIS — R55 Syncope and collapse: Secondary | ICD-10-CM

## 2016-08-07 DIAGNOSIS — I071 Rheumatic tricuspid insufficiency: Secondary | ICD-10-CM | POA: Insufficient documentation

## 2016-08-10 NOTE — Progress Notes (Deleted)
[name] [address], [place] [address] Phone: [phone] Subjective:     CC: back pain follow up. Chronic pain follow-up  [id number]  Felicia Perry is a 70 y.o. female coming in with complaint of chronic low back pain. Patient was found to have more of a spectrum of chronic pain syndrome with a piriformis syndrome. Patient was started on Effexor and has noticed significant improvement.Continues to be very active. Patient was on vacation and did do some paddle boarding even. Some mild discomfort but nothing severe. No radiation down the legs. Feels like she is doing well with the Effexor. Did increase her dose him.  Patient also is having more of a left knee pain.has a degenerative meniscal tear. Still doing well after injections greater than 4 months ago..  Patient though does have some neck pain. Feels that there is a mass on the left side. Appeared to be more of the size of a dime. Patient notices no significant change but is wondering if anything else to be done.  Patient has had difficulty with the vitamin D as well.patient is been on once weekly vitamin D and 1 from 19-48.  Wt Readings from Last 3 Encounters:  08/04/16 151 lb 6.4 oz (68.7 kg)  06/16/16 149 lb (67.6 kg)  05/19/16 150 lb (68 kg)   .   Past medical history, social, surgical and family history all reviewed in electronic medical record.   History of _+ANA following up with rheumatology   Review of Systems: No headache, visual changes, nausea, vomiting, diarrhea, constipation, dizziness, abdominal pain, skin rash, fevers, chills, night sweats, weight loss, swollen lymph nodes, body aches, joint swelling, muscle aches, chest pain, shortness of breath, mood changes.   Objective  There were no vitals taken for this visit.  General: No apparent distress alert and oriented x3 mood and affect normal, dressed appropriately.  HEENT: Pupils equal, extraocular movements intact   Respiratory: Patient does have some shallow breathing as well as some expiratory wheezing Cardiovascular: No lower extremity edema, non tender, no erythema  Skin: Warm dry intact with no signs of infection or rash on extremities or on axial skeleton.  Abdomen: Soft nontender  Neuro: Cranial nerves II through XII are intact, neurovascularly intact in all extremities with 2+ DTRs and 2+ pulses.  Lymph: No lymphadenopathy of posterior or anterior cervical chain or axillae bilaterally.  Gait normal with good balance and coordination MSK:  Non tender with full range of motion and good stability and symmetric strength and tone of elbows, wrist, hip and ankles bilaterally. Mild osteoarthritic changes of multiple joints. Knee: Left Mild arthritic changes nontender ROM full in flexion and extension and lower leg rotation. Ligaments with solid consistent endpoints including ACL, PCL, LCL, MCL. Negative Mcmurray's, Apley's, and Thessalonian tests.  painful patellar compression. Patellar glide with minimal crepitus. Patellar and quadriceps tendons unremarkable. Hamstring and quadriceps strength is normal.  Contralateral knee mild arthritic changes but no pain  Back exam shows the patient is very minimally tender over the sacroiliac joint. Mild discomfort at the paraspinal musculature of the thoracic spine.   Neck exam shows the patient does have what appears to be a lymph node. Fluctuant. Continues to be less than the size of a dime. No change.  Osteopathic findings of  Cervical C2 flexed rotated and side bent right  Thoracic T2 extended rotated and side bent right inhaled second rib T5 extended rotated and side bent left L2 flexed rotated  and side bent right   Impression and Recommendations:     This case required medical decision making of moderate complexity.

## 2016-08-10 NOTE — Assessment & Plan Note (Deleted)
Relatively well controlled at this time. We discussed icing regimen and home exercises. We'll make no changes in her medication. Patient continue with 6-12 week intervals.

## 2016-08-10 NOTE — Assessment & Plan Note (Deleted)
Decision today to treat with OMT was based on Physical Exam  After verbal consent patient was treated with HVLA, ME techniques in thoracic and rib areas  Patient tolerated the procedure well with improvement in symptoms  Patient given exercises, stretches and lifestyle modifications  See medications in patient instructions if given  Patient will follow up in 6-12 weeks

## 2016-08-11 ENCOUNTER — Encounter (HOSPITAL_COMMUNITY): Payer: Medicare Other

## 2016-08-11 ENCOUNTER — Telehealth: Payer: Self-pay | Admitting: Family Medicine

## 2016-08-11 ENCOUNTER — Ambulatory Visit: Payer: Medicare Other | Admitting: Family Medicine

## 2016-08-11 ENCOUNTER — Ambulatory Visit (HOSPITAL_COMMUNITY)
Admission: RE | Admit: 2016-08-11 | Discharge: 2016-08-11 | Disposition: A | Discharge status: Home / Self Care | Payer: Medicare Other | Source: Ambulatory Visit | Attending: Cardiovascular Disease | Admitting: Cardiovascular Disease

## 2016-08-11 ENCOUNTER — Encounter (HOSPITAL_COMMUNITY): Payer: Self-pay

## 2016-08-11 ENCOUNTER — Telehealth: Payer: Self-pay | Admitting: Cardiology

## 2016-08-11 DIAGNOSIS — R55 Syncope and collapse: Secondary | ICD-10-CM | POA: Diagnosis not present

## 2016-08-11 DIAGNOSIS — Z8249 Family history of ischemic heart disease and other diseases of the circulatory system: Secondary | ICD-10-CM | POA: Insufficient documentation

## 2016-08-11 DIAGNOSIS — Z87891 Personal history of nicotine dependence: Secondary | ICD-10-CM | POA: Insufficient documentation

## 2016-08-11 DIAGNOSIS — I10 Essential (primary) hypertension: Secondary | ICD-10-CM | POA: Insufficient documentation

## 2016-08-11 DIAGNOSIS — R5383 Other fatigue: Secondary | ICD-10-CM | POA: Diagnosis not present

## 2016-08-11 DIAGNOSIS — R9439 Abnormal result of other cardiovascular function study: Secondary | ICD-10-CM | POA: Diagnosis not present

## 2016-08-11 DIAGNOSIS — J449 Chronic obstructive pulmonary disease, unspecified: Secondary | ICD-10-CM | POA: Insufficient documentation

## 2016-08-11 LAB — MYOCARDIAL PERFUSION IMAGING
CHL CUP NUCLEAR SRS: 4
CHL CUP RESTING HR STRESS: 65 {beats}/min
CHL RATE OF PERCEIVED EXERTION: 17
CSEPEDS: 31 s
CSEPHR: 87 %
CSEPPHR: 131 {beats}/min
Estimated workload: 7.6 METS
Exercise duration (min): 6 min
LV sys vol: 23 mL
LVDIAVOL: 68 mL (ref 46–106)
MPHR: 150 {beats}/min
SDS: 7
SSS: 9
TID: 1.23

## 2016-08-11 MED ORDER — TECHNETIUM TC 99M TETROFOSMIN IV KIT
30.7000 | PACK | Freq: Once | INTRAVENOUS | Status: AC | PRN
  Administered 2016-08-11: 30.7 via INTRAVENOUS
  Filled 2016-08-11: qty 31

## 2016-08-11 MED ORDER — TECHNETIUM TC 99M TETROFOSMIN IV KIT
10.7000 | PACK | Freq: Once | INTRAVENOUS | Status: AC | PRN
  Administered 2016-08-11: 10.7 via INTRAVENOUS
  Filled 2016-08-11: qty 11

## 2016-08-11 NOTE — Telephone Encounter (Signed)
Call from Dr. Radford Pax of cardiology  Abnormal stress test- needs to be seen urgently by cardiology.   We are to call her nurse Felicia Perry to get her in with doc of day tomorrow.   Stat referral placed.    Felicia Perry- please tell Felicia Perry as well as call Felicia Perry as well as call patient to let her know we need to get her in urgently to cardiology and she will be getting call

## 2016-08-11 NOTE — Progress Notes (Signed)
Results faxed to Dr. Garret Reddish @ [phone] [facility]

## 2016-08-11 NOTE — Telephone Encounter (Signed)
Spoke with Sammuel Bailiff from Cardiology. We were able to get the patient scheduled for tomorrow. I called Mrs. Carlota Raspberry and let her know the time, address, and that she will be seeing Dr. Meda Coffee. She verbalized understanding.

## 2016-08-11 NOTE — Telephone Encounter (Signed)
Discussed results of abnormal nuclear stress test with Dr. Yong Channel.  He will call my nurse to get patient in to see DOD tomorrow.

## 2016-08-12 ENCOUNTER — Encounter: Payer: Self-pay | Admitting: *Deleted

## 2016-08-12 ENCOUNTER — Ambulatory Visit: Payer: Medicare Other

## 2016-08-12 ENCOUNTER — Encounter: Payer: Self-pay | Admitting: Cardiology

## 2016-08-12 ENCOUNTER — Ambulatory Visit (INDEPENDENT_AMBULATORY_CARE_PROVIDER_SITE_OTHER): Payer: Medicare Other | Admitting: Cardiology

## 2016-08-12 VITALS — BP 170/82 | HR 70 | Ht 61.0 in | Wt 152.0 lb

## 2016-08-12 DIAGNOSIS — F419 Anxiety disorder, unspecified: Secondary | ICD-10-CM

## 2016-08-12 DIAGNOSIS — R9439 Abnormal result of other cardiovascular function study: Secondary | ICD-10-CM

## 2016-08-12 DIAGNOSIS — I119 Hypertensive heart disease without heart failure: Secondary | ICD-10-CM | POA: Diagnosis not present

## 2016-08-12 DIAGNOSIS — Z01812 Encounter for preprocedural laboratory examination: Secondary | ICD-10-CM | POA: Diagnosis not present

## 2016-08-12 DIAGNOSIS — R0609 Other forms of dyspnea: Secondary | ICD-10-CM

## 2016-08-12 DIAGNOSIS — I25110 Atherosclerotic heart disease of native coronary artery with unstable angina pectoris: Secondary | ICD-10-CM

## 2016-08-12 DIAGNOSIS — R0600 Dyspnea, unspecified: Secondary | ICD-10-CM

## 2016-08-12 DIAGNOSIS — Z7901 Long term (current) use of anticoagulants: Secondary | ICD-10-CM | POA: Diagnosis not present

## 2016-08-12 DIAGNOSIS — I200 Unstable angina: Secondary | ICD-10-CM

## 2016-08-12 LAB — PROTIME-INR
INR: 1
Prothrombin Time: 10.7 s (ref 9.0–11.5)

## 2016-08-12 MED ORDER — ASPIRIN EC 81 MG PO TBEC
81.0000 mg | DELAYED_RELEASE_TABLET | Freq: Every day | ORAL | 3 refills | Status: DC
Start: 2016-08-12 — End: 2021-01-02

## 2016-08-12 NOTE — Progress Notes (Deleted)
[facility] [address] [place], [place] [address] Phone: [phone] Subjective:     CC: back pain follow up. Chronic pain follow-up  [id number]  Felicia Perry is a 70 y.o. female coming in with complaint of chronic low back pain. Patient was found to have more of a spectrum of chronic pain syndrome with a piriformis syndrome. Patient was started on Effexor and has noticed significant improvement.Continues to be very active. Patient was on vacation and did do some paddle boarding even. Some mild discomfort but nothing severe. No radiation down the legs. Feels like she is doing well with the Effexor. Did increase her dose him.  Patient also is having more of a left knee pain.has a degenerative meniscal tear. Still doing well after injections greater than 4 months ago..  Patient though does have some neck pain. Feels that there is a mass on the left side. Appeared to be more of the size of a dime. Patient notices no significant change but is wondering if anything else to be done.  Patient has had difficulty with the vitamin D as well.patient is been on once weekly vitamin D and 1 from 19-48.  Wt Readings from Last 3 Encounters:  08/11/16 151 lb (68.5 kg)  08/04/16 151 lb 6.4 oz (68.7 kg)  06/16/16 149 lb (67.6 kg)   .   Past medical history, social, surgical and family history all reviewed in electronic medical record.   History of _+ANA following up with rheumatology   Review of Systems: No headache, visual changes, nausea, vomiting, diarrhea, constipation, dizziness, abdominal pain, skin rash, fevers, chills, night sweats, weight loss, swollen lymph nodes, body aches, joint swelling, muscle aches, chest pain, shortness of breath, mood changes.   Objective  There were no vitals taken for this visit.  General: No apparent distress alert and oriented x3 mood and affect normal, dressed appropriately.  HEENT: Pupils equal, extraocular movements  intact  Respiratory: Patient does have some shallow breathing as well as some expiratory wheezing Cardiovascular: No lower extremity edema, non tender, no erythema  Skin: Warm dry intact with no signs of infection or rash on extremities or on axial skeleton.  Abdomen: Soft nontender  Neuro: Cranial nerves II through XII are intact, neurovascularly intact in all extremities with 2+ DTRs and 2+ pulses.  Lymph: No lymphadenopathy of posterior or anterior cervical chain or axillae bilaterally.  Gait normal with good balance and coordination MSK:  Non tender with full range of motion and good stability and symmetric strength and tone of elbows, wrist, hip and ankles bilaterally. Mild osteoarthritic changes of multiple joints. Knee: Left Mild arthritic changes nontender ROM full in flexion and extension and lower leg rotation. Ligaments with solid consistent endpoints including ACL, PCL, LCL, MCL. Negative Mcmurray's, Apley's, and Thessalonian tests.  painful patellar compression. Patellar glide with minimal crepitus. Patellar and quadriceps tendons unremarkable. Hamstring and quadriceps strength is normal.  Contralateral knee mild arthritic changes but no pain  Back exam shows the patient is very minimally tender over the sacroiliac joint. Mild discomfort at the paraspinal musculature of the thoracic spine.   Neck exam shows the patient does have what appears to be a lymph node. Fluctuant. Continues to be less than the size of a dime. No change.  Osteopathic findings of  Cervical C2 flexed rotated and side bent right  Thoracic T2 extended rotated and side bent right inhaled second rib T5 extended rotated and side bent left L2 flexed rotated  and side bent right   Impression and Recommendations:     This case required medical decision making of moderate complexity.

## 2016-08-12 NOTE — Patient Instructions (Addendum)
Medication Instructions:   START TAKING ASPIRIN (ENTERIC COATED) 81 MG ONCE DAILY   Labwork:  TODAY--PT/INR    Testing/Procedures:  Your physician has requested that you have a cardiac catheterization. Cardiac catheterization is used to diagnose and/or treat various heart conditions. Doctors may recommend this procedure for a number of different reasons. The most common reason is to evaluate chest pain. Chest pain can be a symptom of coronary artery disease (CAD), and cardiac catheterization can show whether plaque is narrowing or blocking your heart's arteries. This procedure is also used to evaluate the valves, as well as measure the blood flow and oxygen levels in different parts of your heart. For further information please visit HugeFiesta.tn. Please follow instruction sheet, as given.  YOUR CARDIAC CATH IS SCHEDULED FOR THIS Friday August 14, 2016 AT 7:30 AM WITH DR COOPER TO DO.  YOU MUST ARRIVE AT [facility] ([facility]) AT 5:30 AM, ON THE DAY OF THIS PROCEDURE.     Follow-Up:  2 MONTHS WITH DR Meda Coffee       If you need a refill on your cardiac medications before your next appointment, please call your pharmacy.

## 2016-08-12 NOTE — Telephone Encounter (Signed)
Patient is scheduled today with Dr. Meda Coffee.

## 2016-08-12 NOTE — Progress Notes (Signed)
Cardiology Office Note    Date:  08/12/2016   ID:  Licet, Ebnet [birth year], MRN [id number]  PCP:  Garret Reddish, MD  Cardiologist:  Ena Dawley, MD   Chief complain: Chest pain, positive stress test  History of Present Illness:  Felicia Perry is a 70 y.o. female who is being referred to [place] by Dr. Yong Channel for a positive stress test. The patient states that she has a very distant history of smoking, she quit when she was 70 years old, she also has a history of hypertension, anxiety. She states that she has been going through very stressful., She's taking care of her son who is addicted to narcotics after car accident. She owns a Copywriter, advertising and also cleans herself and she has always been very active. Lately she has noticed that she has no energy and would also feels retrosternal tightness on exertion. She describes 2 episodes of syncope while walking with no prodromal symptoms and waking up shortly afterwards. She denies palpitations. No lower extremity edema orthopnea or proximal nocturnal dyspnea.  Her father had CHF in his 22s, mother died of heart attack at age of 56.  Past Medical History:  Diagnosis Date  . Adenomatous polyp 11/23/2005  . Anxiety   . Arthritis   . Asthma   . Diverticulosis   . DIVERTICULOSIS, COLON 04/22/2007       . GERD (gastroesophageal reflux disease)   . History of shingles   . Hypertension   . Internal hemorrhoids   . Low back pain   . PUD (peptic ulcer disease)    Past Surgical History:  Procedure Laterality Date  . 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 cataract surgery     Current Medications: Outpatient Medications Prior to Visit  Medication Sig Dispense Refill  . acetaminophen (TYLENOL) 325 MG tablet Take 650 mg by mouth every 6 (six) hours as needed.    Marland Kitchen alprazolam (XANAX) 2 MG tablet TAKE 1/2 TO 1 TABLET AT BEDTIME AS NEEDED FOR SLEEP 30 tablet 5  . amLODipine-valsartan (EXFORGE) 10-320 MG tablet  TAKE ONE TABLET EACH DAY 30 tablet 5  . levocetirizine (XYZAL) 5 MG tablet TAKE ONE TABLET EVERY MORNING 30 tablet 11  . propranolol (INDERAL) 40 MG tablet TAKE ONE TABLET BY MOUTH ONCE DAILY 30 tablet 5  . ranitidine (ZANTAC) 150 MG tablet Take 1 tablet (150 mg total) by mouth 2 (two) times daily. 60 tablet 11  . traMADol (ULTRAM) 50 MG tablet Take 50 mg by mouth 2 (two) times daily.    Marland Kitchen triamcinolone cream (KENALOG) 0.5 % Apply 1 application topically 2 (two) times daily. To affected areas. 30 g 3  . venlafaxine XR (EFFEXOR-XR) 37.5 MG 24 hr capsule Take 3 capsules (112.5 mg total) by mouth daily with breakfast. 90 capsule 1  . Vitamin D, Ergocalciferol, (DRISDOL) 50000 units CAPS capsule TAKE 1 CAPSULE EVERY 7 DAYS 10 capsule 1   No facility-administered medications prior to visit.      Allergies:   Celexa [citalopram hydrobromide]; Cephalosporins; Clarithromycin; Clarithromycin; Doxycycline hyclate; Levofloxacin; Penicillins; and Zolpidem tartrate   Social History   Social History  . Marital status: Divorced    Spouse name: N/A  . Number of children: N/A  . Years of education: N/A   Occupational History  . Cleaning    Social History Main Topics  . Smoking status: Former Research scientist (life sciences)  . Smokeless tobacco: Never Used  . Alcohol  use No  . Drug use: No  . Sexual activity: Yes   Other Topics Concern  . None   Social History Narrative   Divorced for 38 years in 2016. 2 sons.  2 granddaughters.       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       Hobbies: watch tv-survivor, dancing with the stars, enjoy alone time. Best friend [name] patient of Dr. Yong Channel. Enjoys work, time on Teaching laboratory technician.      Family History:  The patient's family history includes COPD in her father; Diabetes in her mother; Heart disease in her mother; Hypertension in her mother.   ROS:   Please see the history of present illness.    ROS All other systems reviewed and  are negative.   PHYSICAL EXAM:   VS:  BP (!) 170/82   Pulse 70   Ht 5\' 1"  (1.549 m)   Wt 152 lb (68.9 kg)   SpO2 98%   BMI 28.72 kg/m    GEN: Well nourished, well developed, in no acute distress  HEENT: normal  Neck: no JVD, carotid bruits, or masses Cardiac: RRR; no murmurs, rubs, or gallops,no edema  Respiratory:  clear to auscultation bilaterally, normal work of breathing GI: soft, nontender, nondistended, + BS MS: no deformity or atrophy  Skin: warm and dry, no rash Neuro:  Alert and Oriented x 3, Strength and sensation are intact Psych: euthymic mood, full affect  Wt Readings from Last 3 Encounters:  08/12/16 152 lb (68.9 kg)  08/11/16 151 lb (68.5 kg)  08/04/16 151 lb 6.4 oz (68.7 kg)      Studies/Labs Reviewed:   EKG:  EKG is ordered today.  The ekg ordered today demonstrates SR, normal ECG.  Recent Labs: 08/04/2016: ALT 10; BUN 16; Creatinine, Ser 0.82; Hemoglobin 14.5; Platelets 314.0; Potassium 4.3; Sodium 142   Lipid Panel    Component Value Date/Time   CHOL 140 01/29/2015 0807   TRIG 113.0 01/29/2015 0807   HDL 25.50 (L) 01/29/2015 0807   CHOLHDL 5 01/29/2015 0807   VLDL 22.6 01/29/2015 0807   LDLCALC 92 01/29/2015 0807   LDLDIRECT 91.9 02/28/2007 0947    Additional studies/ records that were reviewed today include:   TTE: 08/07/2016 - Left ventricle: The cavity size was normal. Wall thickness was   increased in a pattern of mild LVH. Systolic function was normal.   The estimated ejection fraction was in the range of 55% to 60%.   Wall motion was normal; there were no regional wall motion   abnormalities. Doppler parameters are consistent with abnormal   left ventricular relaxation (grade 1 diastolic dysfunction). - Mitral valve: Calcified annulus. - MIld TR  Exercise nuclear stress test: 08/11/2016  Nuclear stress EF: 66%.  Blood pressure demonstrated a hypotensive response to exercise.  There was no ST segment deviation noted during  stress.  There is a medium defect of moderate severity present in the basal inferoseptal, mid inferoseptal and apical septal and mid and apical inferior location. The defect is reversible and consistent with ischemia.  There is transient ischemic dilatation with a TID of 1.23 which can be seen with multivessel CAD.  The left ventricular ejection fraction is hyperdynamic (>65%).  This is a high risk study.     ASSESSMENT:    1. Hypertensive heart disease without heart failure   2. Pre-procedure lab exam   3. Abnormal stress test   4. Unstable angina (  [place])   5. Anxiety      PLAN:  In order of problems listed above:  1. Unstable angina - the patient has signs of typical angina, with abnormal stress test, decrease of blood pressure during exertion and evidence of ischemia. We will schedule patient for cardiac catheterization for Friday, October 20 with Dr. Burt Knack. We will start her on coated aspirin as she has history of gastric ulcers, last treated 5 years ago. Last hemoglobin check last week was 14.5. We will check PT PTT otherwise all of her labs were normal last week. 2. Hypertensive heart disease without CHF - patient's blood pressure today 170/82, she is very anxious and also was told not to take her medicines, she is advised to restart her medication and we will recheck her blood pressure at the next visit. 3. Her lipids were normal a year ago, if she has an evidence of coronary artery disease we will start statin.    Medication Adjustments/Labs and Tests Ordered: Current medicines are reviewed at length with the patient today.  Concerns regarding medicines are outlined above.  Medication changes, Labs and Tests ordered today are listed in the Patient Instructions below. Patient Instructions  Medication Instructions:   START TAKING ASPIRIN (ENTERIC COATED) 81 MG ONCE DAILY   Labwork:  TODAY--PT/INR    Testing/Procedures:  Your physician has requested that you have a  cardiac catheterization. Cardiac catheterization is used to diagnose and/or treat various heart conditions. Doctors may recommend this procedure for a number of different reasons. The most common reason is to evaluate chest pain. Chest pain can be a symptom of coronary artery disease (CAD), and cardiac catheterization can show whether plaque is narrowing or blocking your heart's arteries. This procedure is also used to evaluate the valves, as well as measure the blood flow and oxygen levels in different parts of your heart. For further information please visit HugeFiesta.tn. Please follow instruction sheet, as given.  YOUR CARDIAC CATH IS SCHEDULED FOR THIS Friday August 14, 2016 AT 7:30 AM WITH DR COOPER TO DO.  YOU MUST ARRIVE AT [facility] ([facility]) AT 5:30 AM, ON THE DAY OF THIS PROCEDURE.     Follow-Up:  2 MONTHS WITH DR Meda Coffee       If you need a refill on your cardiac medications before your next appointment, please call your pharmacy.      Signed, Ena Dawley, MD  08/12/2016 5:15 PM    [facility] [place], [place], [place]  [address] Phone: [phone]; Fax: [phone]

## 2016-08-13 ENCOUNTER — Ambulatory Visit: Payer: Medicare Other | Admitting: Family Medicine

## 2016-08-14 ENCOUNTER — Other Ambulatory Visit: Payer: Self-pay

## 2016-08-14 ENCOUNTER — Encounter (HOSPITAL_COMMUNITY): Payer: Self-pay | Admitting: Cardiovascular Disease

## 2016-08-14 ENCOUNTER — Ambulatory Visit (HOSPITAL_COMMUNITY)
Admission: RE | Admit: 2016-08-14 | Discharge: 2016-08-16 | Disposition: A | Discharge status: Home / Self Care | Payer: Medicare Other | Source: Ambulatory Visit | Attending: Cardiovascular Disease | Admitting: Cardiovascular Disease

## 2016-08-14 ENCOUNTER — Encounter (HOSPITAL_COMMUNITY)
Admission: RE | Disposition: A | Discharge status: Home / Self Care | Payer: Self-pay | Source: Ambulatory Visit | Attending: Cardiovascular Disease

## 2016-08-14 DIAGNOSIS — K648 Other hemorrhoids: Secondary | ICD-10-CM | POA: Diagnosis not present

## 2016-08-14 DIAGNOSIS — Z87891 Personal history of nicotine dependence: Secondary | ICD-10-CM | POA: Diagnosis not present

## 2016-08-14 DIAGNOSIS — I2510 Atherosclerotic heart disease of native coronary artery without angina pectoris: Secondary | ICD-10-CM | POA: Diagnosis not present

## 2016-08-14 DIAGNOSIS — I2582 Chronic total occlusion of coronary artery: Secondary | ICD-10-CM | POA: Diagnosis not present

## 2016-08-14 DIAGNOSIS — I119 Hypertensive heart disease without heart failure: Secondary | ICD-10-CM | POA: Insufficient documentation

## 2016-08-14 DIAGNOSIS — Z880 Allergy status to penicillin: Secondary | ICD-10-CM | POA: Diagnosis not present

## 2016-08-14 DIAGNOSIS — I25110 Atherosclerotic heart disease of native coronary artery with unstable angina pectoris: Secondary | ICD-10-CM | POA: Insufficient documentation

## 2016-08-14 DIAGNOSIS — R9439 Abnormal result of other cardiovascular function study: Secondary | ICD-10-CM

## 2016-08-14 DIAGNOSIS — R931 Abnormal findings on diagnostic imaging of heart and coronary circulation: Secondary | ICD-10-CM

## 2016-08-14 DIAGNOSIS — Z8711 Personal history of peptic ulcer disease: Secondary | ICD-10-CM | POA: Diagnosis not present

## 2016-08-14 DIAGNOSIS — F419 Anxiety disorder, unspecified: Secondary | ICD-10-CM | POA: Diagnosis not present

## 2016-08-14 DIAGNOSIS — Z8619 Personal history of other infectious and parasitic diseases: Secondary | ICD-10-CM | POA: Insufficient documentation

## 2016-08-14 DIAGNOSIS — I200 Unstable angina: Secondary | ICD-10-CM

## 2016-08-14 DIAGNOSIS — Z955 Presence of coronary angioplasty implant and graft: Secondary | ICD-10-CM

## 2016-08-14 DIAGNOSIS — K219 Gastro-esophageal reflux disease without esophagitis: Secondary | ICD-10-CM | POA: Diagnosis present

## 2016-08-14 DIAGNOSIS — R0600 Dyspnea, unspecified: Secondary | ICD-10-CM

## 2016-08-14 DIAGNOSIS — J45909 Unspecified asthma, uncomplicated: Secondary | ICD-10-CM | POA: Diagnosis present

## 2016-08-14 DIAGNOSIS — Z833 Family history of diabetes mellitus: Secondary | ICD-10-CM | POA: Diagnosis not present

## 2016-08-14 DIAGNOSIS — I2584 Coronary atherosclerosis due to calcified coronary lesion: Secondary | ICD-10-CM | POA: Diagnosis not present

## 2016-08-14 DIAGNOSIS — I25118 Atherosclerotic heart disease of native coronary artery with other forms of angina pectoris: Secondary | ICD-10-CM | POA: Diagnosis present

## 2016-08-14 DIAGNOSIS — M1990 Unspecified osteoarthritis, unspecified site: Secondary | ICD-10-CM | POA: Diagnosis not present

## 2016-08-14 DIAGNOSIS — K279 Peptic ulcer, site unspecified, unspecified as acute or chronic, without hemorrhage or perforation: Secondary | ICD-10-CM | POA: Diagnosis present

## 2016-08-14 DIAGNOSIS — Z8249 Family history of ischemic heart disease and other diseases of the circulatory system: Secondary | ICD-10-CM | POA: Insufficient documentation

## 2016-08-14 DIAGNOSIS — I10 Essential (primary) hypertension: Secondary | ICD-10-CM | POA: Diagnosis present

## 2016-08-14 HISTORY — DX: Sleep apnea, unspecified: G47.30

## 2016-08-14 HISTORY — DX: Atherosclerotic heart disease of native coronary artery without angina pectoris: I25.10

## 2016-08-14 HISTORY — DX: Unspecified osteoarthritis, unspecified site: M19.90

## 2016-08-14 HISTORY — PX: CARDIAC CATHETERIZATION: SHX172

## 2016-08-14 LAB — POCT ACTIVATED CLOTTING TIME
Activated Clotting Time: 246 seconds
Activated Clotting Time: 235 seconds
Activated Clotting Time: 290 seconds

## 2016-08-14 SURGERY — LEFT HEART CATH AND CORONARY ANGIOGRAPHY

## 2016-08-14 MED ORDER — SODIUM CHLORIDE 0.9% FLUSH: 3.0000 mL | INTRAVENOUS | Status: DC | PRN

## 2016-08-14 MED ORDER — VERAPAMIL HCL 2.5 MG/ML IV SOLN
INTRAVENOUS | Status: AC
  Filled 2016-08-14: qty 2

## 2016-08-14 MED ORDER — AMLODIPINE BESYLATE 10 MG PO TABS
10.0000 mg | ORAL_TABLET | Freq: Every day | ORAL | Status: DC
  Administered 2016-08-15 – 2016-08-16 (×2): 10 mg via ORAL
  Filled 2016-08-14 (×2): qty 1

## 2016-08-14 MED ORDER — HEPARIN SODIUM (PORCINE) 1000 UNIT/ML IJ SOLN
INTRAMUSCULAR | Status: DC | PRN
  Administered 2016-08-14: 3000 [IU] via INTRAVENOUS
  Administered 2016-08-14: 4000 [IU] via INTRAVENOUS
  Administered 2016-08-14: 3000 [IU] via INTRAVENOUS

## 2016-08-14 MED ORDER — HEPARIN SODIUM (PORCINE) 1000 UNIT/ML IJ SOLN
INTRAMUSCULAR | Status: AC
  Filled 2016-08-14: qty 1

## 2016-08-14 MED ORDER — SODIUM CHLORIDE 0.9 % WEIGHT BASED INFUSION: 1.0000 mL/kg/h | INTRAVENOUS | Status: DC

## 2016-08-14 MED ORDER — NITROGLYCERIN 1 MG/10 ML FOR IR/CATH LAB
INTRA_ARTERIAL | Status: DC | PRN
  Administered 2016-08-14 (×5): 100 ug via INTRACORONARY

## 2016-08-14 MED ORDER — AMLODIPINE BESYLATE-VALSARTAN 10-320 MG PO TABS: 1.0000 | ORAL_TABLET | Freq: Every day | ORAL | Status: DC

## 2016-08-14 MED ORDER — PROPRANOLOL HCL 40 MG PO TABS
40.0000 mg | ORAL_TABLET | Freq: Every day | ORAL | Status: DC
  Administered 2016-08-15 – 2016-08-16 (×2): 40 mg via ORAL
  Filled 2016-08-14 (×2): qty 1

## 2016-08-14 MED ORDER — ANGIOPLASTY BOOK
Freq: Once | Status: AC
  Administered 2016-08-14: 21:00:00
  Filled 2016-08-14: qty 1

## 2016-08-14 MED ORDER — MIDAZOLAM HCL 2 MG/2ML IJ SOLN
INTRAMUSCULAR | Status: AC
  Filled 2016-08-14: qty 2

## 2016-08-14 MED ORDER — SODIUM CHLORIDE 0.9% FLUSH: 3.0000 mL | Freq: Two times a day (BID) | INTRAVENOUS | Status: DC

## 2016-08-14 MED ORDER — IOPAMIDOL (ISOVUE-370) INJECTION 76%
INTRAVENOUS | Status: AC
  Filled 2016-08-14: qty 100

## 2016-08-14 MED ORDER — NITROGLYCERIN 1 MG/10 ML FOR IR/CATH LAB
INTRA_ARTERIAL | Status: AC
  Filled 2016-08-14: qty 20

## 2016-08-14 MED ORDER — VERAPAMIL HCL 2.5 MG/ML IV SOLN
INTRAVENOUS | Status: DC | PRN
  Administered 2016-08-14: 10 mL via INTRA_ARTERIAL

## 2016-08-14 MED ORDER — ONDANSETRON HCL 4 MG/2ML IJ SOLN: 4.0000 mg | Freq: Four times a day (QID) | INTRAMUSCULAR | Status: DC | PRN

## 2016-08-14 MED ORDER — HEPARIN (PORCINE) IN NACL 2-0.9 UNIT/ML-% IJ SOLN
INTRAMUSCULAR | Status: AC
  Filled 2016-08-14: qty 1000

## 2016-08-14 MED ORDER — ACETAMINOPHEN 325 MG PO TABS
650.0000 mg | ORAL_TABLET | ORAL | Status: DC | PRN
  Administered 2016-08-15: 650 mg via ORAL
  Filled 2016-08-14: qty 2

## 2016-08-14 MED ORDER — ASPIRIN 81 MG PO CHEW: 81.0000 mg | CHEWABLE_TABLET | ORAL | Status: DC

## 2016-08-14 MED ORDER — MIDAZOLAM HCL 2 MG/2ML IJ SOLN
INTRAMUSCULAR | Status: DC | PRN
  Administered 2016-08-14 (×4): 1 mg via INTRAVENOUS

## 2016-08-14 MED ORDER — FENTANYL CITRATE (PF) 100 MCG/2ML IJ SOLN
INTRAMUSCULAR | Status: AC
  Filled 2016-08-14: qty 2

## 2016-08-14 MED ORDER — FAMOTIDINE 20 MG PO TABS
20.0000 mg | ORAL_TABLET | Freq: Every day | ORAL | Status: DC
  Administered 2016-08-15 – 2016-08-16 (×2): 20 mg via ORAL
  Filled 2016-08-14 (×2): qty 1

## 2016-08-14 MED ORDER — IOPAMIDOL (ISOVUE-370) INJECTION 76%
INTRAVENOUS | Status: DC | PRN
  Administered 2016-08-14: 140 mL via INTRA_ARTERIAL

## 2016-08-14 MED ORDER — VENLAFAXINE HCL ER 75 MG PO CP24
112.5000 mg | ORAL_CAPSULE | Freq: Every day | ORAL | Status: DC
  Administered 2016-08-15 – 2016-08-16 (×2): 112.5 mg via ORAL
  Filled 2016-08-14 (×2): qty 1

## 2016-08-14 MED ORDER — TICAGRELOR 90 MG PO TABS
90.0000 mg | ORAL_TABLET | Freq: Two times a day (BID) | ORAL | Status: DC
  Administered 2016-08-14 – 2016-08-16 (×4): 90 mg via ORAL
  Filled 2016-08-14 (×4): qty 1

## 2016-08-14 MED ORDER — LIDOCAINE HCL (PF) 1 % IJ SOLN
INTRAMUSCULAR | Status: AC
  Filled 2016-08-14: qty 30

## 2016-08-14 MED ORDER — LORATADINE 10 MG PO TABS
10.0000 mg | ORAL_TABLET | Freq: Every day | ORAL | Status: DC
  Administered 2016-08-15: 10:00:00 10 mg via ORAL
  Filled 2016-08-14 (×2): qty 1

## 2016-08-14 MED ORDER — HEPARIN (PORCINE) IN NACL 2-0.9 UNIT/ML-% IJ SOLN
INTRAMUSCULAR | Status: DC | PRN
  Administered 2016-08-14: 1000 mL

## 2016-08-14 MED ORDER — SODIUM CHLORIDE 0.9 % IV SOLN: 250.0000 mL | INTRAVENOUS | Status: DC | PRN

## 2016-08-14 MED ORDER — IRBESARTAN 300 MG PO TABS
300.0000 mg | ORAL_TABLET | Freq: Every day | ORAL | Status: DC
  Filled 2016-08-14: qty 1

## 2016-08-14 MED ORDER — TRAMADOL HCL 50 MG PO TABS
100.0000 mg | ORAL_TABLET | ORAL | Status: DC
  Administered 2016-08-15 – 2016-08-16 (×2): 100 mg via ORAL
  Filled 2016-08-14 (×2): qty 2

## 2016-08-14 MED ORDER — SODIUM CHLORIDE 0.9 % WEIGHT BASED INFUSION
3.0000 mL/kg/h | INTRAVENOUS | Status: AC
  Administered 2016-08-14: 3 mL/kg/h via INTRAVENOUS

## 2016-08-14 MED ORDER — TICAGRELOR 90 MG PO TABS
ORAL_TABLET | ORAL | Status: AC
  Filled 2016-08-14: qty 2

## 2016-08-14 MED ORDER — ALPRAZOLAM 0.5 MG PO TABS
2.0000 mg | ORAL_TABLET | Freq: Every day | ORAL | Status: DC
Start: 2016-08-14 — End: 2016-08-16
  Administered 2016-08-14 – 2016-08-15 (×2): 2 mg via ORAL
  Filled 2016-08-14 (×2): qty 4

## 2016-08-14 MED ORDER — ASPIRIN EC 81 MG PO TBEC
81.0000 mg | DELAYED_RELEASE_TABLET | Freq: Every day | ORAL | Status: DC
  Administered 2016-08-15 – 2016-08-16 (×2): 81 mg via ORAL
  Filled 2016-08-14 (×2): qty 1

## 2016-08-14 MED ORDER — FENTANYL CITRATE (PF) 100 MCG/2ML IJ SOLN
INTRAMUSCULAR | Status: DC | PRN
  Administered 2016-08-14 (×3): 25 ug via INTRAVENOUS

## 2016-08-14 MED ORDER — LEVOCETIRIZINE DIHYDROCHLORIDE 5 MG PO TABS: 5.0000 mg | ORAL_TABLET | Freq: Every morning | ORAL | Status: DC

## 2016-08-14 MED ORDER — LIDOCAINE HCL (PF) 1 % IJ SOLN
INTRAMUSCULAR | Status: DC | PRN
  Administered 2016-08-14: 5 mL

## 2016-08-14 MED ORDER — SODIUM CHLORIDE 0.9 % WEIGHT BASED INFUSION
3.0000 mL/kg/h | INTRAVENOUS | Status: DC
  Administered 2016-08-14: 3.004 mL/kg/h via INTRAVENOUS

## 2016-08-14 MED ORDER — TICAGRELOR 90 MG PO TABS
ORAL_TABLET | ORAL | Status: DC | PRN
  Administered 2016-08-14: 180 mg via ORAL

## 2016-08-14 MED ORDER — SODIUM CHLORIDE 0.9% FLUSH
3.0000 mL | Freq: Two times a day (BID) | INTRAVENOUS | Status: DC
  Administered 2016-08-14 – 2016-08-15 (×4): 3 mL via INTRAVENOUS

## 2016-08-14 SURGICAL SUPPLY — 23 items
BALLN EMERGE MR 2.0X15 (BALLOONS) ×2
BALLN EMERGE MR PUSH 1.5X15 (BALLOONS) ×2
BALLN ~~LOC~~ EMERGE MR 3.0X20 (BALLOONS) ×2
BALLOON EMERGE MR 2.0X15 (BALLOONS) IMPLANT
BALLOON EMERGE MR PUSH 1.5X15 (BALLOONS) IMPLANT
BALLOON ~~LOC~~ EMERGE MR 3.0X20 (BALLOONS) IMPLANT
CATH INFINITI 5 FR JL3.5 (CATHETERS) ×1 IMPLANT
CATH INFINITI 5FR ANG PIGTAIL (CATHETERS) ×1 IMPLANT
CATH INFINITI JR4 5F (CATHETERS) ×1 IMPLANT
DEVICE RAD COMP TR BAND LRG (VASCULAR PRODUCTS) ×1 IMPLANT
GLIDESHEATH SLEND SS 6F .021 (SHEATH) ×1 IMPLANT
GUIDE CATH RUNWAY 6FR FR4 (CATHETERS) ×1 IMPLANT
KIT ENCORE 26 ADVANTAGE (KITS) ×1 IMPLANT
KIT HEART LEFT (KITS) ×2 IMPLANT
PACK CARDIAC CATHETERIZATION (CUSTOM PROCEDURE TRAY) ×2 IMPLANT
STENT SYNERGY DES 2.25X20 (Permanent Stent) ×1 IMPLANT
STENT SYNERGY DES 2.5X38 (Permanent Stent) ×1 IMPLANT
STENT SYNERGY DES 3X16 (Permanent Stent) ×1 IMPLANT
TRANSDUCER W/STOPCOCK (MISCELLANEOUS) ×2 IMPLANT
TUBING CIL FLEX 10 FLL-RA (TUBING) ×2 IMPLANT
WIRE HI TORQ VERSACORE-J 145CM (WIRE) ×1 IMPLANT
WIRE HI TORQ WHISPER MS 190CM (WIRE) ×1 IMPLANT
WIRE SAFE-T 1.5MM-J .035X260CM (WIRE) ×1 IMPLANT

## 2016-08-14 NOTE — Interval H&P Note (Signed)
Cath Lab Visit (complete for each Cath Lab visit)  Clinical Evaluation Leading to the Procedure:   ACS: No.  Non-ACS:    Anginal Classification: CCS II  Anti-ischemic medical therapy: Minimal Therapy (1 class of medications)  Non-Invasive Test Results: High-risk stress test findings: cardiac mortality >3%/year  Prior CABG: No previous CABG      History and Physical Interval Note:  08/14/2016 7:34 AM  Felicia Perry  has presented today for surgery, with the diagnosis of abnormal stress test  The various methods of treatment have been discussed with the patient and family. After consideration of risks, benefits and other options for treatment, the patient has consented to  Procedure(s): Left Heart Cath and Coronary Angiography (N/A) as a surgical intervention .  The patient's history has been reviewed, patient examined, no change in status, stable for surgery.  I have reviewed the patient's chart and labs.  Questions were answered to the patient's satisfaction.     Sherren Mocha

## 2016-08-14 NOTE — H&P (View-Only) (Signed)
Cardiology Office Note    Date:  08/12/2016   ID:  Felicia Perry 21-Aug-1946, MRN [id number]  PCP:  Garret Reddish, MD  Cardiologist:  Ena Dawley, MD   Chief complain: Chest pain, positive stress test  History of Present Illness:  Felicia Perry is a 70 y.o. female who is being referred to [place] by Dr. Yong Channel for a positive stress test. The patient states that she has a very distant history of smoking, she quit when she was 70 years old, she also has a history of hypertension, anxiety. She states that she has been going through very stressful., She's taking care of her son who is addicted to narcotics after car accident. She owns a Copywriter, advertising and also cleans herself and she has always been very active. Lately she has noticed that she has no energy and would also feels retrosternal tightness on exertion. She describes 2 episodes of syncope while walking with no prodromal symptoms and waking up shortly afterwards. She denies palpitations. No lower extremity edema orthopnea or proximal nocturnal dyspnea.  Her father had CHF in his 32s, mother died of heart attack at age of 52.  Past Medical History:  Diagnosis Date  . Adenomatous polyp 11/23/2005  . Anxiety   . Arthritis   . Asthma   . Diverticulosis   . DIVERTICULOSIS, COLON 04/22/2007       . GERD (gastroesophageal reflux disease)   . History of shingles   . Hypertension   . Internal hemorrhoids   . Low back pain   . PUD (peptic ulcer disease)    Past Surgical History:  Procedure Laterality Date  . 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 cataract surgery     Current Medications: Outpatient Medications Prior to Visit  Medication Sig Dispense Refill  . acetaminophen (TYLENOL) 325 MG tablet Take 650 mg by mouth every 6 (six) hours as needed.    Marland Kitchen alprazolam (XANAX) 2 MG tablet TAKE 1/2 TO 1 TABLET AT BEDTIME AS NEEDED FOR SLEEP 30 tablet 5  . amLODipine-valsartan (EXFORGE) 10-320 MG tablet  TAKE ONE TABLET EACH DAY 30 tablet 5  . levocetirizine (XYZAL) 5 MG tablet TAKE ONE TABLET EVERY MORNING 30 tablet 11  . propranolol (INDERAL) 40 MG tablet TAKE ONE TABLET BY MOUTH ONCE DAILY 30 tablet 5  . ranitidine (ZANTAC) 150 MG tablet Take 1 tablet (150 mg total) by mouth 2 (two) times daily. 60 tablet 11  . traMADol (ULTRAM) 50 MG tablet Take 50 mg by mouth 2 (two) times daily.    Marland Kitchen triamcinolone cream (KENALOG) 0.5 % Apply 1 application topically 2 (two) times daily. To affected areas. 30 g 3  . venlafaxine XR (EFFEXOR-XR) 37.5 MG 24 hr capsule Take 3 capsules (112.5 mg total) by mouth daily with breakfast. 90 capsule 1  . Vitamin D, Ergocalciferol, (DRISDOL) 50000 units CAPS capsule TAKE 1 CAPSULE EVERY 7 DAYS 10 capsule 1   No facility-administered medications prior to visit.      Allergies:   Celexa [citalopram hydrobromide]; Cephalosporins; Clarithromycin; Clarithromycin; Doxycycline hyclate; Levofloxacin; Penicillins; and Zolpidem tartrate   Social History   Social History  . Marital status: Divorced    Spouse name: N/A  . Number of children: N/A  . Years of education: N/A   Occupational History  . Cleaning    Social History Main Topics  . Smoking status: Former Research scientist (life sciences)  . Smokeless tobacco: Never Used  . Alcohol  use No  . Drug use: No  . Sexual activity: Yes   Other Topics Concern  . None   Social History Narrative   Divorced for 38 years in 2016. 2 sons.  2 granddaughters.       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       Hobbies: watch tv-survivor, dancing with the stars, enjoy alone time. Best friend [name] patient of Dr. Yong Channel. Enjoys work, time on Teaching laboratory technician.      Family History:  The patient's family history includes COPD in her father; Diabetes in her mother; Heart disease in her mother; Hypertension in her mother.   ROS:   Please see the history of present illness.    ROS All other systems reviewed and  are negative.   PHYSICAL EXAM:   VS:  BP (!) 170/82   Pulse 70   Ht 5\' 1"  (1.549 m)   Wt 152 lb (68.9 kg)   SpO2 98%   BMI 28.72 kg/m    GEN: Well nourished, well developed, in no acute distress  HEENT: normal  Neck: no JVD, carotid bruits, or masses Cardiac: RRR; no murmurs, rubs, or gallops,no edema  Respiratory:  clear to auscultation bilaterally, normal work of breathing GI: soft, nontender, nondistended, + BS MS: no deformity or atrophy  Skin: warm and dry, no rash Neuro:  Alert and Oriented x 3, Strength and sensation are intact Psych: euthymic mood, full affect  Wt Readings from Last 3 Encounters:  08/12/16 152 lb (68.9 kg)  08/11/16 151 lb (68.5 kg)  08/04/16 151 lb 6.4 oz (68.7 kg)      Studies/Labs Reviewed:   EKG:  EKG is ordered today.  The ekg ordered today demonstrates SR, normal ECG.  Recent Labs: 08/04/2016: ALT 10; BUN 16; Creatinine, Ser 0.82; Hemoglobin 14.5; Platelets 314.0; Potassium 4.3; Sodium 142   Lipid Panel    Component Value Date/Time   CHOL 140 01/29/2015 0807   TRIG 113.0 01/29/2015 0807   HDL 25.50 (L) 01/29/2015 0807   CHOLHDL 5 01/29/2015 0807   VLDL 22.6 01/29/2015 0807   LDLCALC 92 01/29/2015 0807   LDLDIRECT 91.9 02/28/2007 0947    Additional studies/ records that were reviewed today include:   TTE: 08/07/2016 - Left ventricle: The cavity size was normal. Wall thickness was   increased in a pattern of mild LVH. Systolic function was normal.   The estimated ejection fraction was in the range of 55% to 60%.   Wall motion was normal; there were no regional wall motion   abnormalities. Doppler parameters are consistent with abnormal   left ventricular relaxation (grade 1 diastolic dysfunction). - Mitral valve: Calcified annulus. - MIld TR  Exercise nuclear stress test: 08/11/2016  Nuclear stress EF: 66%.  Blood pressure demonstrated a hypotensive response to exercise.  There was no ST segment deviation noted during  stress.  There is a medium defect of moderate severity present in the basal inferoseptal, mid inferoseptal and apical septal and mid and apical inferior location. The defect is reversible and consistent with ischemia.  There is transient ischemic dilatation with a TID of 1.23 which can be seen with multivessel CAD.  The left ventricular ejection fraction is hyperdynamic (>65%).  This is a high risk study.     ASSESSMENT:    1. Hypertensive heart disease without heart failure   2. Pre-procedure lab exam   3. Abnormal stress test   4. Unstable angina (  [place])   5. Anxiety      PLAN:  In order of problems listed above:  1. Unstable angina - the patient has signs of typical angina, with abnormal stress test, decrease of blood pressure during exertion and evidence of ischemia. We will schedule patient for cardiac catheterization for Friday, October 20 with Dr. Burt Knack. We will start her on coated aspirin as she has history of gastric ulcers, last treated 5 years ago. Last hemoglobin check last week was 14.5. We will check PT PTT otherwise all of her labs were normal last week. 2. Hypertensive heart disease without CHF - patient's blood pressure today 170/82, she is very anxious and also was told not to take her medicines, she is advised to restart her medication and we will recheck her blood pressure at the next visit. 3. Her lipids were normal a year ago, if she has an evidence of coronary artery disease we will start statin.    Medication Adjustments/Labs and Tests Ordered: Current medicines are reviewed at length with the patient today.  Concerns regarding medicines are outlined above.  Medication changes, Labs and Tests ordered today are listed in the Patient Instructions below. Patient Instructions  Medication Instructions:   START TAKING ASPIRIN (ENTERIC COATED) 81 MG ONCE DAILY   Labwork:  TODAY--PT/INR    Testing/Procedures:  Your physician has requested that you have a  cardiac catheterization. Cardiac catheterization is used to diagnose and/or treat various heart conditions. Doctors may recommend this procedure for a number of different reasons. The most common reason is to evaluate chest pain. Chest pain can be a symptom of coronary artery disease (CAD), and cardiac catheterization can show whether plaque is narrowing or blocking your heart's arteries. This procedure is also used to evaluate the valves, as well as measure the blood flow and oxygen levels in different parts of your heart. For further information please visit HugeFiesta.[email or web address]. Please follow instruction sheet, as given.  YOUR CARDIAC CATH IS SCHEDULED FOR THIS Friday August 14, 2016 AT 7:30 AM WITH DR COOPER TO DO.  YOU MUST ARRIVE AT [facility] ([place] [facility]) AT 5:30 AM, ON THE DAY OF THIS PROCEDURE.     Follow-Up:  2 MONTHS WITH DR Meda Coffee       If you need a refill on your cardiac medications before your next appointment, please call your pharmacy.      Signed, Ena Dawley, MD  08/12/2016 5:15 PM    [facility] [place], [place], [place]  [address] Phone: [phone]; Fax: [phone]

## 2016-08-14 NOTE — Progress Notes (Signed)
TR BAND REMOVAL  LOCATION:    right radial  DEFLATED PER PROTOCOL:    Yes.    TIME BAND OFF / DRESSING APPLIED:    1415   SITE UPON ARRIVAL:    Level 0  SITE AFTER BAND REMOVAL:    Level 0  CIRCULATION SENSATION AND MOVEMENT:    Within Normal Limits   Yes.    COMMENTS:   Tolerated procedure well. Post TRB instructions given

## 2016-08-14 NOTE — Progress Notes (Signed)
Ronalee Belts, thank you, I agree.  Ivy, Please have her see a PA in 2 weeks, I have her scheduled for 10/20/16.  Thank you, KN

## 2016-08-14 NOTE — Care Management Note (Addendum)
Case Management Note  Patient Details  Name: UNKNOWN KUSCHEL MRN: [id number] Date of Birth: [birth year]  Subjective/Objective:     Chest pain, positive stress test, S/P coronary stent                Action/Plan: Discharge Planning: NCM spoke to pt. Lives at home with son, Elta Guadeloupe. He is at home to assist as needed. Her best friend will stay with pt all next week. Pt states she is self-employed. Contacted her pharamcy [facility] November, they have five pills in stock and will hold for pt. Pharmacist will order to arrive Monday. She will pick up rest on Monday. Provided pt with Brilinta 30 day free trial card. Explained to pt how card works. Her copay is $37.00.    08/15/2016 1000 NCM spoke to pt and explained she will receive a Rx for her medication at dc. She can take her [facility] [address], [place] open [facility] hours. Her pharmacy [facility] is closed on Sunday.   PCP- Marin Olp MD  Expected Discharge Date:    08/14/2016            Expected Discharge Plan:  Home/Self Care  In-House Referral:  NA  Discharge planning Services  CM Consult, Medication Assistance  Post Acute Care Choice:  NA Choice offered to:  NA  DME Arranged:  N/A DME Agency:  NA  HH Arranged:  NA HH Agency:  NA  Status of Service:  Completed, signed off  If discussed at [place] of Stay Meetings, dates discussed:    Additional Comments:  Erenest Rasher, RN 08/14/2016, 5:00 PM

## 2016-08-15 ENCOUNTER — Inpatient Hospital Stay: Admission: RE | Admit: 2016-08-15 | Payer: Medicare Other | Source: Ambulatory Visit

## 2016-08-15 ENCOUNTER — Ambulatory Visit (HOSPITAL_COMMUNITY): Payer: Medicare Other

## 2016-08-15 DIAGNOSIS — R0602 Shortness of breath: Secondary | ICD-10-CM | POA: Diagnosis not present

## 2016-08-15 DIAGNOSIS — I25810 Atherosclerosis of coronary artery bypass graft(s) without angina pectoris: Secondary | ICD-10-CM

## 2016-08-15 DIAGNOSIS — I2584 Coronary atherosclerosis due to calcified coronary lesion: Secondary | ICD-10-CM | POA: Diagnosis not present

## 2016-08-15 DIAGNOSIS — I25110 Atherosclerotic heart disease of native coronary artery with unstable angina pectoris: Secondary | ICD-10-CM | POA: Diagnosis not present

## 2016-08-15 DIAGNOSIS — I119 Hypertensive heart disease without heart failure: Secondary | ICD-10-CM | POA: Diagnosis not present

## 2016-08-15 DIAGNOSIS — I2582 Chronic total occlusion of coronary artery: Secondary | ICD-10-CM | POA: Diagnosis not present

## 2016-08-15 LAB — URINALYSIS, ROUTINE W REFLEX MICROSCOPIC
GLUCOSE, UA: NEGATIVE mg/dL
HGB URINE DIPSTICK: NEGATIVE
Ketones, ur: NEGATIVE mg/dL
Nitrite: NEGATIVE
PH: 6 (ref 5.0–8.0)
Protein, ur: NEGATIVE mg/dL
SPECIFIC GRAVITY, URINE: 1.027 (ref 1.005–1.030)

## 2016-08-15 LAB — BASIC METABOLIC PANEL
ANION GAP: 7 (ref 5–15)
BUN: 10 mg/dL (ref 6–20)
CALCIUM: 9 mg/dL (ref 8.9–10.3)
CHLORIDE: 104 mmol/L (ref 101–111)
CO2: 26 mmol/L (ref 22–32)
Creatinine, Ser: 0.87 mg/dL (ref 0.44–1.00)
GFR calc Af Amer: >60 mL/min (ref >60)
GFR calc non Af Amer: >60 mL/min (ref >60)
GLUCOSE: 110 mg/dL — AB (ref 65–99)
POTASSIUM: 3.7 mmol/L (ref 3.5–5.1)
Sodium: 137 mmol/L (ref 135–145)

## 2016-08-15 LAB — URINE MICROSCOPIC-ADD ON

## 2016-08-15 LAB — CBC
HEMATOCRIT: 42 % (ref 36.0–46.0)
HEMOGLOBIN: 14.4 g/dL (ref 12.0–15.0)
MCH: 29.8 pg (ref 26.0–34.0)
MCHC: 34.3 g/dL (ref 30.0–36.0)
MCV: 87 fL (ref 78.0–100.0)
Platelets: 275 10*3/uL (ref 150–400)
RBC: 4.83 MIL/uL (ref 3.87–5.11)
RDW: 12.4 % (ref 11.5–15.5)
WBC: 11.1 10*3/uL — ABNORMAL HIGH (ref 4.0–10.5)

## 2016-08-15 MED ORDER — IRBESARTAN 300 MG PO TABS
300.0000 mg | ORAL_TABLET | Freq: Every day | ORAL | Status: DC
  Administered 2016-08-15 – 2016-08-16 (×2): 300 mg via ORAL
  Filled 2016-08-15: qty 1

## 2016-08-15 MED ORDER — ATORVASTATIN CALCIUM 80 MG PO TABS
80.0000 mg | ORAL_TABLET | Freq: Every day | ORAL | Status: DC
  Administered 2016-08-15: 10:00:00 80 mg via ORAL
  Filled 2016-08-15: qty 1

## 2016-08-15 MED ORDER — ALBUTEROL SULFATE (2.5 MG/3ML) 0.083% IN NEBU
2.5000 mg | INHALATION_SOLUTION | Freq: Four times a day (QID) | RESPIRATORY_TRACT | Status: DC
  Administered 2016-08-15 (×2): 2.5 mg via RESPIRATORY_TRACT
  Filled 2016-08-15 (×2): qty 3

## 2016-08-15 NOTE — Progress Notes (Signed)
Patient arrived to 2W room 6 in no apparent distress.  Patient was oriented to unit and room to include phone and call light. Telemetry monitor was applied and CCMD was notified.   Will continue to monitor.

## 2016-08-15 NOTE — Progress Notes (Addendum)
F/u note from this AM. Patient felt fatigued, chills, SOB, mild nausea.Has had some polyuria.  CXR is benign. Mild uptrend in WBC/ Will check UA/micro, monitor today and consider discharge tomorrow.   Zandra Abts MD
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
## 2016-08-15 NOTE — Progress Notes (Signed)
Patient reporting "pressure" in the middle of her chest, mild, does not request pain medication at this time.

## 2016-08-15 NOTE — Progress Notes (Signed)
Primary cardiologist:  Subjective:    SOB, fatigue, chills this AM  Objective:   Temp:  [97.6 F (36.4 C)-98.8 F (37.1 C)] 98.4 F (36.9 C) (10/21 0709) Pulse Rate:  [62-95] 91 (10/21 0709) Resp:  [0-46] 18 (10/21 0709) BP: (93-164)/(50-89) 164/73 (10/21 0709) SpO2:  [0 %-100 %] 98 % (10/21 0709) Weight:  [153 lb 7 oz (69.6 kg)] 153 lb 7 oz (69.6 kg) (10/21 0445) Last BM Date: 08/14/16  Filed Weights   08/14/16 0556 08/15/16 0445  Weight: 150 lb (68 kg) 153 lb 7 oz (69.6 kg)    Intake/Output Summary (Last 24 hours) at 08/15/16 0718 Last data filed at 08/15/16 0446  Gross per 24 hour  Intake             1536 ml  Output             1300 ml  Net              236 ml    Telemetry: SR, sinus tach  Exam:  General: NAD  HEENT: sclera clear, throat clear  Resp: CTAB  Cardiac: RRR, no m/r/g, no jvd  GI: abdomen soft, NT, ND  MSK: no LE edema  Neuro: no focal deficits  Psych: appropriate affect  Lab Results:  Basic Metabolic Panel:  Recent Labs Lab 08/15/16 0349  NA 137  K 3.7  CL 104  CO2 26  GLUCOSE 110*  BUN 10  CREATININE 0.87  CALCIUM 9.0    Liver Function Tests: No results for input(s): AST, ALT, ALKPHOS, BILITOT, PROT, ALBUMIN in the last 168 hours.  CBC:  Recent Labs Lab 08/15/16 0349  WBC 11.1*  HGB 14.4  HCT 42.0  MCV 87.0  PLT 275    Cardiac Enzymes: No results for input(s): CKTOTAL, CKMB, CKMBINDEX, TROPONINI in the last 168 hours.  BNP: No results for input(s): PROBNP in the last 8760 hours.  Coagulation:  Recent Labs Lab 08/12/16 1605  INR 1.0    ECG:   Medications:   Scheduled Medications: . alprazolam  2 mg Oral QHS  . amLODipine  10 mg Oral Daily   Or  . irbesartan  300 mg Oral Daily  . aspirin EC  81 mg Oral Daily  . famotidine  20 mg Oral Daily  . loratadine  10 mg Oral Daily  . propranolol  40 mg Oral Daily  . sodium chloride flush  3 mL Intravenous Q12H  . ticagrelor  90 mg Oral BID  .  traMADol  100 mg Oral BH-[id number]  . venlafaxine XR  112.5 mg Oral Q breakfast     Infusions:     PRN Medications:  sodium chloride, acetaminophen, ondansetron (ZOFRAN) IV, sodium chloride flush     Assessment/Plan    1. Unstable angina - recent chest pain, abnormal stress test - referred for cath. Found to have RCA CTO s/p PCI, 60% LCX disease managed medically with recs to consider staged PCI if continued symptoms.  - postcath labs are stable, EKG this AM shows sinus rhythm - plan for at least one year of DAPT, she is on ASA and brillinta. Addictional medical therapy includes ARB, beta blocker. We will start high dose statin with atorva 80mg  daily.    2. SOB - reports increased SOB this AM. Shaking chills overnight, some stomach upset but no N/V/D. No dysuria - reports history of cold weather asthma - will obtain CXR, given nebulizer treatment. Monitor into the afternoon and reevaluate, hold  discharge at this time.     Carlyle Dolly, M.D., F.A.C.C.Patient ID: Felicia Perry, female   DOB: 1945/10/27, 70 y.o.   MRN: [id number]

## 2016-08-15 NOTE — Progress Notes (Signed)
CM received call from RN requesting CM meet with pt to reinforce understanding of Brilinta availability and cost. CM met with pt and pt verbalized understanding she can take her discharge prescription and the Brilinta free 30 day trial card  [facility] [address] on [address] home.  She can then have her follow up physician office e-scribe or she can take a paper prescription to pharmacy of her choice for refills which will cost her 37.00/month copay.  No other CM needs were communicated.

## 2016-08-16 ENCOUNTER — Encounter (HOSPITAL_COMMUNITY): Payer: Self-pay | Admitting: Physician Assistant

## 2016-08-16 DIAGNOSIS — I25110 Atherosclerotic heart disease of native coronary artery with unstable angina pectoris: Secondary | ICD-10-CM | POA: Diagnosis not present

## 2016-08-16 DIAGNOSIS — K279 Peptic ulcer, site unspecified, unspecified as acute or chronic, without hemorrhage or perforation: Secondary | ICD-10-CM | POA: Diagnosis present

## 2016-08-16 DIAGNOSIS — R931 Abnormal findings on diagnostic imaging of heart and coronary circulation: Secondary | ICD-10-CM

## 2016-08-16 DIAGNOSIS — I2510 Atherosclerotic heart disease of native coronary artery without angina pectoris: Secondary | ICD-10-CM | POA: Diagnosis present

## 2016-08-16 DIAGNOSIS — I25118 Atherosclerotic heart disease of native coronary artery with other forms of angina pectoris: Secondary | ICD-10-CM | POA: Diagnosis present

## 2016-08-16 LAB — BASIC METABOLIC PANEL
Anion gap: 8 (ref 5–15)
BUN: 14 mg/dL (ref 6–20)
CHLORIDE: 104 mmol/L (ref 101–111)
CO2: 26 mmol/L (ref 22–32)
Calcium: 9.1 mg/dL (ref 8.9–10.3)
Creatinine, Ser: 0.94 mg/dL (ref 0.44–1.00)
Glucose, Bld: 120 mg/dL — ABNORMAL HIGH (ref 65–99)
POTASSIUM: 3.7 mmol/L (ref 3.5–5.1)
SODIUM: 138 mmol/L (ref 135–145)

## 2016-08-16 LAB — CBC WITH DIFFERENTIAL/PLATELET
BASOS ABS: 0.1 10*3/uL (ref 0.0–0.1)
Basophils Relative: 1 %
EOS ABS: 0.3 10*3/uL (ref 0.0–0.7)
EOS PCT: 3 %
HCT: 43 % (ref 36.0–46.0)
HEMOGLOBIN: 14.7 g/dL (ref 12.0–15.0)
LYMPHS ABS: 2.3 10*3/uL (ref 0.7–4.0)
LYMPHS PCT: 24 %
MCH: 29.8 pg (ref 26.0–34.0)
MCHC: 34.2 g/dL (ref 30.0–36.0)
MCV: 87.2 fL (ref 78.0–100.0)
Monocytes Absolute: 1.8 10*3/uL — ABNORMAL HIGH (ref 0.1–1.0)
Monocytes Relative: 18 %
NEUTROS PCT: 54 %
Neutro Abs: 5.2 10*3/uL (ref 1.7–7.7)
PLATELETS: 270 10*3/uL (ref 150–400)
RBC: 4.93 MIL/uL (ref 3.87–5.11)
RDW: 12.3 % (ref 11.5–15.5)
WBC: 9.7 10*3/uL (ref 4.0–10.5)

## 2016-08-16 MED ORDER — TICAGRELOR 90 MG PO TABS: 90.0000 mg | ORAL_TABLET | Freq: Two times a day (BID) | ORAL | 3 refills | Status: DC

## 2016-08-16 MED ORDER — ATORVASTATIN CALCIUM 80 MG PO TABS: 80.0000 mg | ORAL_TABLET | Freq: Every day | ORAL | 3 refills | Status: DC

## 2016-08-16 NOTE — Discharge Instructions (Signed)

## 2016-08-16 NOTE — Progress Notes (Signed)
Patient seen and discussed with PA Grandville Silos. She was monitored overnight s/[p her recent PCI due to systemic symptoms including SOB, chills, nausea, and polyuria. CXR normal, UA negative. Initial bump in WBC has resolved. No fevers overnight. We will plan for discharge today with outpatient cardiology follow up.   Zandra Abts MD

## 2016-08-16 NOTE — Progress Notes (Signed)
Order received to discharge patient.  Patient expresses readiness to discharge.  Telemetry monitor removed and CCMD notified.  PIV access removed without complication.  Discharge instructions, follow up, medications and instructions for their use were discussed with patient and patient voiced understanding.

## 2016-08-16 NOTE — Progress Notes (Signed)
Discharge Summary    Patient ID: Felicia Perry,  MRN: [id number], DOB/AGE: 70-Apr-1947 70 y.o.  Admit date: 08/14/2016 Discharge date: 08/16/2016  Primary Care Provider: Garret Reddish Primary Cardiologist: Dr. Meda Coffee   Discharge Diagnoses    Principal Problem:   Abnormal nuclear cardiac imaging test Active Problems:   Essential hypertension   Asthma   GERD (gastroesophageal reflux disease)   Coronary artery disease   PUD (peptic ulcer disease)   Allergies Allergies  Allergen Reactions  . Celexa [Citalopram Hydrobromide]     psychosis  . Cephalosporins     REACTION: tongue swelling  . Clarithromycin     REACTION: blisters in mouth  . Clarithromycin   . Doxycycline Hyclate     REACTION: nausea, vomiting  . Levofloxacin     REACTION: tongue swells  . Penicillins     REACTION: per patient causes rash,hives  . Zolpidem Tartrate     REACTION: difficulty with concentration     History of Present Illness     Felicia Perry is a 70 y.o. female with a history of HTN, PUD, asthma and anxiety who was admitted to [facility] on 08/14/16 for planned cardiac catheterization.  She was seen in the office by Dr. Meda Coffee on 08/12/16 for evaluation of chest pain and a positive stress test. Her PCP had referred her for exercise nuclear stress testing on 08/11/16 which demonstrated a hypotensive response to exercise and a medium defect of moderate severity present in the basal inferoseptal, mid inferoseptal and apical septal and mid and apical inferior location that was reversible and consistent with ischemia. There was also transient  ischemic dilatation with a TID of 1.23 which can be seen with multivessel CAD. Dr. Meda Coffee set her up for coronary angiography on 08/14/16.    Hospital Course     Consultants: none  1. Unstable angina: recent chest pain and abnormal stress test. LHC on 08/14/16 showed RCA CTO s/p PCI, 60% LCX disease managed medically with recs to consider staged PCI  if continued symptoms. Plan for at least one year of DAPT with ASA/Brillinta. Additional medical therapy includes ARB, BB and high dose statin with atorva 80mg  daily. She has a history of ulcers and will be maintained on the enteric coated aspirin. She has 90 days worth of samples from office.  2. HTN: BP well controlled currently.   3. Dispo: yesterday plan was for discharge home but she had chills, urinary frequency and SOB. White count was mildly elevated at 11.1, but now has normalized. CXR and UA clear. She was given nebulizer treatments which helped. She is feeling better today and ready to go home.   The patient has had an uncomplicated hospital course and is recovering well. The radial catheter site is stable She has been seen by Dr. Harl Bowie today and deemed ready for discharge home. All follow-up appointments have been scheduled. Discharge medications are listed below.  _____________  Discharge Vitals Blood pressure 127/67, pulse 80, temperature 97.9 F (36.6 C), temperature source Oral, resp. rate 20, height 5\' 1"  (1.549 m), weight 153 lb 7 oz (69.6 kg), SpO2 97 %.  Filed Weights   08/14/16 0556 08/15/16 0445  Weight: 150 lb (68 kg) 153 lb 7 oz (69.6 kg)    GEN: Well nourished, well developed, in no acute distress  HEENT: normal  Neck: no JVD, carotid bruits, or masses Cardiac: RRR; no murmurs, rubs, or gallops,no edema  Respiratory:  clear to auscultation bilaterally, normal work of breathing  GI: soft, nontender, nondistended, + BS MS: no deformity or atrophy  Skin: warm and dry, no rash Neuro:  Alert and Oriented x 3, Strength and sensation are intact Psych: euthymic mood, full affect  Labs & Radiologic Studies     CBC  Recent Labs  08/15/16 0349 08/16/16 0257  WBC 11.1* 9.7  NEUTROABS  --  5.2  HGB 14.4 14.7  HCT 42.0 43.0  MCV 87.0 87.2  PLT 275 [id number]   Basic Metabolic Panel  Recent Labs  08/15/16 0349 08/16/16 0257  NA 137 138  K 3.7 3.7  CL 104 104    CO2 26 26  GLUCOSE 110* 120*  BUN 10 14  CREATININE 0.87 0.94  CALCIUM 9.0 9.1     Dg Chest 2 View  Result Date: 08/15/2016 CLINICAL DATA:  Shortness of Breath, recent cardiac stent placement EXAM: CHEST  2 VIEW COMPARISON:  12/04/2014 FINDINGS: The heart size and mediastinal contours are within normal limits. Both lungs are clear. The visualized skeletal structures are unremarkable. IMPRESSION: No active cardiopulmonary disease. Electronically Signed   By: Inez Catalina M.D.   On: 08/15/2016 12:37     Diagnostic Studies/Procedures    Procedures August 31, 2016 Coronary Stent Intervention  Left Heart Cath and Coronary Angiography  Conclusion   1. Severe stenosis with chronic total occlusion of the mid right coronary artery, treated successfully with complex stenting as detailed 2. Moderately severe stenosis of the mid left circumflex involving a large obtuse marginal branch 3. Patency of the LAD with mild nonobstructive disease 4. Normal LV function by noninvasive assessment  Recommendations: Dual antiplatelet therapy with aspirin and brilinta for at least one year. Consider staged PCI of the left circumflex bifurcation if recurrent anginal symptoms arise.    _____________    Disposition   Pt is being discharged home today in good condition.  Follow-up Plans & Appointments    Follow-up Information    Ena Dawley, MD .   Specialty:  Cardiology Why:  The office will call you to make a follow up appointment in 1-2 weeks Contact information: [place] [address] [phone]          Discharge Instructions    Amb Referral to Cardiac Rehabilitation    Complete by:  As directed    Diagnosis:  Coronary Stents      Discharge Medications     Medication List    TAKE these medications   acetaminophen 325 MG tablet Commonly known as:  TYLENOL Take 650 mg by mouth every 6 (six) hours as needed.   alprazolam 2 MG tablet Commonly  known as:  XANAX TAKE 1/2 TO 1 TABLET AT BEDTIME AS NEEDED FOR SLEEP What changed:  how much to take  how to take this  when to take this  additional instructions   amLODipine-valsartan 10-320 MG tablet Commonly known as:  EXFORGE TAKE ONE TABLET EACH DAY   aspirin EC 81 MG tablet Take 1 tablet (81 mg total) by mouth daily.   atorvastatin 80 MG tablet Commonly known as:  LIPITOR Take 1 tablet (80 mg total) by mouth daily at 6 PM.   levocetirizine 5 MG tablet Commonly known as:  XYZAL TAKE ONE TABLET EVERY MORNING   propranolol 40 MG tablet Commonly known as:  INDERAL TAKE ONE TABLET BY MOUTH ONCE DAILY   ranitidine 150 MG tablet Commonly known as:  ZANTAC Take 1 tablet (150 mg total) by mouth 2 (two) times daily.  ticagrelor 90 MG Tabs tablet Commonly known as:  BRILINTA Take 1 tablet (90 mg total) by mouth 2 (two) times daily.   traMADol 50 MG tablet Commonly known as:  ULTRAM Take 100 mg by mouth every morning.   triamcinolone cream 0.5 % Commonly known as:  KENALOG Apply 1 application topically 2 (two) times daily. To affected areas. What changed:  when to take this  reasons to take this  additional instructions   venlafaxine XR 37.5 MG 24 hr capsule Commonly known as:  EFFEXOR-XR Take 3 capsules (112.5 mg total) by mouth daily with breakfast.   Vitamin D (Ergocalciferol) 50000 units Caps capsule Commonly known as:  DRISDOL TAKE 1 CAPSULE EVERY 7 DAYS         Outstanding Labs/Studies   NONE  Duration of Discharge Encounter   Greater than 30 minutes including physician time.  Signed, Angelena Form PA-C 08/16/2016, 8:29 AM

## 2016-08-17 LAB — URINE CULTURE

## 2016-08-17 MED FILL — Lidocaine HCl Local Preservative Free (PF) Inj 1%: INTRAMUSCULAR | Qty: 30 | Status: AC

## 2016-08-18 ENCOUNTER — Ambulatory Visit (INDEPENDENT_AMBULATORY_CARE_PROVIDER_SITE_OTHER): Payer: Medicare Other | Admitting: Internal Medicine

## 2016-08-18 ENCOUNTER — Telehealth: Payer: Self-pay | Admitting: Physician Assistant

## 2016-08-18 ENCOUNTER — Encounter: Payer: Self-pay | Admitting: Internal Medicine

## 2016-08-18 VITALS — BP 122/74 | HR 68 | Ht 61.0 in | Wt 148.0 lb

## 2016-08-18 DIAGNOSIS — E213 Hyperparathyroidism, unspecified: Secondary | ICD-10-CM

## 2016-08-18 DIAGNOSIS — Z8639 Personal history of other endocrine, nutritional and metabolic disease: Secondary | ICD-10-CM | POA: Diagnosis not present

## 2016-08-18 DIAGNOSIS — E559 Vitamin D deficiency, unspecified: Secondary | ICD-10-CM | POA: Diagnosis not present

## 2016-08-18 NOTE — Telephone Encounter (Signed)
New message       TCM appt on 08-24-16 with Melina Copa per Valetta Fuller

## 2016-08-18 NOTE — Progress Notes (Signed)
Patient ID: Felicia Perry, female   DOB: [birth year], 70 y.o.   MRN: [id number]   HPI  Felicia Perry is a 70 y.o.-year-old female, returning for normocalcemic hyperparathyroidism. Pt previously seen by Dr Loanne Drilling. Last visit with me was 1 year ago.  She had 3 heart stents placed recently.   Pt was dx with hyperparathyroidism in 2012.  I reviewed pt's pertinent labs - latest PTH levels normal, checked with [facility] assay: Lab Results  Component Value Date   PTH 30 08/04/2016   PTH 38 10/01/2015   PTH Comment 10/01/2015   PTH 20 01/15/2015   PTH Comment 01/15/2015   PTH 21 09/18/2014   PTH 30 07/30/2014   PTH 50.0 07/21/2013   PTH 87.2 (H) 08/19/2012   PTH 54.7 02/19/2012   PTH 84.7 (H) 11/13/2011   PTH 72.7 (H) 09/21/2011    Ca levels also normal since 2014: Lab Results  Component Value Date   CALCIUM 9.1 08/16/2016   CALCIUM 9.0 08/15/2016   CALCIUM 9.6 08/04/2016   CALCIUM 9.3 08/04/2016   CALCIUM 9.8 10/01/2015   CALCIUM 9.9 08/06/2015   CALCIUM 9.5 01/29/2015   CALCIUM 9.5 01/15/2015   CALCIUM 9.3 11/27/2014   CALCIUM 9.4 11/09/2014   Component     Latest Ref Rng 07/30/2014  PTH-Related Protein (PTH-RP)     14 - 27 pg/mL 17   Of note, she had a NM parathyroid scan on (11/30/2011): Faint focus of residual activity within the right lobe of thyroid gland. This is indeterminate finding but could represent the site of a parathyroid adenoma. Consider follow-up scan or cross sectional imaging of the neck.  Had a hand fracture in 2014 (traumatic).  No h/o osteoporosis. 09/26/2014: Results:  Lumbar spine (L1-L4) Femoral neck (FN) 33% distal radius  T-score + 0.5 RFN: - 1.5 LFN: - 1.2 Unfortunately, not checked, despite being ordered   Assessment: the BMD is low according to the [facility] classification for osteoporosis (see below). Fracture risk: moderate FRAX score: 10 year major osteoporotic risk: 14.6%. 10 year hip fracture risk: 1.7%. These are under the  thresholds for treatment of 20% and 3%, respectively.  Had early menopause (35-38 y/o), just like her mother.   No h/o kidney stones.  + h/o CKD. Last BUN/Cr: Lab Results  Component Value Date   BUN 14 08/16/2016   CREATININE 0.94 08/16/2016   Pt was on HCTZ, stopped 3.5 years ago.  + h/o vitamin D deficiency. Last level was low >> we increased vit D to 4000 units daily, then she came off and now on Ergocalciferol 50,000 units once a week.  Lab Results  Component Value Date   VD25OH 51.88 08/04/2016   VD25OH 48.37 02/04/2016   VD25OH 19.34 (L) 10/01/2015   VD25OH 19.91 (L) 01/15/2015   VD25OH 31.66 09/18/2014   VD25OH 24.57 (L) 07/30/2014   When I initially she was on Calcitriol (nonformulary) 0.25 mg 3x a week - started in 2013 by Dr Loanne Drilling. She was wondering whether she needed to continue with Calcitriol as starting 10/2014, it was not covered by her insurance. We started to decrease the calcitriol and she ended up stopping calcitriol by herself. Calcium levels remained normal.  She also has a h/o seasonal asthma, triggered by cold weather >> Prednisone tapers >> weight gain and increased hunger.   ROS: Constitutional: no weight loss/gain, no fatigue, no subjective hyperthermia, + poor sleep Eyes: + blurry vision, no xerophthalmia ENT: no sore throat, no nodules palpated in throat,  no dysphagia/odynophagia, no hoarseness Cardiovascular: no CP/+ SOB/+ palpitations/no leg swelling  Respiratory: no cough/+ SOB/no wheezing Gastrointestinal: no N/V/D/+ C/no heartburn Musculoskeletal: no muscle/joint aches Skin: no rash, + easy bruising, + itching Neurological: no tremors/numbness/tingling/dizziness, + LOC x2 before the stents  I reviewed pt's medications, allergies, PMH, social hx, family hx, and changes were documented in the history of present illness. Otherwise, unchanged from my initial visit note:  Past Medical History:  Diagnosis Date  . Adenomatous polyp 11/23/2005  .  Anxiety   . Coronary artery disease    a. HC on 08/14/16 showed RCA CTO s/p PCI, 60% LCX disease managed medically with recs to consider staged PCI if continued symptoms.   Marland Kitchen DIVERTICULOSIS, COLON 04/22/2007       . GERD (gastroesophageal reflux disease)   . History of shingles   . Hypertension   . Internal hemorrhoids   . Osteoarthritis    "knees, hands, lower back" (08/14/2016)  . PUD (peptic ulcer disease)   . Sleep apnea    a. resolved with weight loss   Past Surgical History:  Procedure Laterality Date  . BREAST SURGERY Left 1980s   Fibrous Tumors removed   . CARDIAC CATHETERIZATION N/A 08/14/2016   Procedure: Left Heart Cath and Coronary Angiography;  Surgeon: Sherren Mocha, MD;  Location: [place] [facility];  Service: Cardiovascular;  Laterality: N/A;  . CARDIAC CATHETERIZATION N/A 08/14/2016   Procedure: Coronary Stent Intervention;  Surgeon: Sherren Mocha, MD;  Location: [place] [facility];  Service: Cardiovascular;  Laterality: N/A;  . CATARACT EXTRACTION W/ INTRAOCULAR LENS  IMPLANT, BILATERAL Bilateral 05/2009  . CORONARY ANGIOPLASTY    . TUBAL LIGATION  1970s   History   Social History  . Marital Status: Divorced    Spouse Name: N/A    Number of Children: 2   Occupational History  . Cleaning    Social History Main Topics  . Smoking status: Former Smoker, quit 1979  . Smokeless tobacco: Never Used  . Alcohol Use: No  . Drug Use: No  . 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    Current Outpatient Prescriptions on File Prior to Visit  Medication Sig Dispense Refill  . acetaminophen (TYLENOL) 325 MG tablet Take 650 mg by mouth every 6 (six) hours as needed.    Marland Kitchen alprazolam (XANAX) 2 MG tablet TAKE 1/2 TO 1 TABLET AT BEDTIME AS NEEDED FOR SLEEP (Patient taking differently: Take 2 mg by mouth at bedtime. ) 30 tablet 5  .  amLODipine-valsartan (EXFORGE) 10-320 MG tablet TAKE ONE TABLET EACH DAY 30 tablet 5  . aspirin EC 81 MG tablet Take 1 tablet (81 mg total) by mouth daily. 90 tablet 3  . atorvastatin (LIPITOR) 80 MG tablet Take 1 tablet (80 mg total) by mouth daily at 6 PM. 90 tablet 3  . levocetirizine (XYZAL) 5 MG tablet TAKE ONE TABLET EVERY MORNING 30 tablet 11  . propranolol (INDERAL) 40 MG tablet TAKE ONE TABLET BY MOUTH ONCE DAILY 30 tablet 5  . ranitidine (ZANTAC) 150 MG tablet Take 1 tablet (150 mg total) by mouth 2 (two) times daily. 60 tablet 11  . ticagrelor (BRILINTA) 90 MG TABS tablet Take 1 tablet (90 mg total) by mouth 2 (two) times daily. 180 tablet 3  . traMADol (ULTRAM) 50 MG tablet Take 100 mg  by mouth every morning.     . triamcinolone cream (KENALOG) 0.5 % Apply 1 application topically 2 (two) times daily. To affected areas. (Patient taking differently: Apply 1 application topically 2 (two) times daily as needed. To affected areas.) 30 g 3  . venlafaxine XR (EFFEXOR-XR) 37.5 MG 24 hr capsule Take 3 capsules (112.5 mg total) by mouth daily with breakfast. 90 capsule 1  . Vitamin D, Ergocalciferol, (DRISDOL) 50000 units CAPS capsule TAKE 1 CAPSULE EVERY 7 DAYS 10 capsule 1   No current facility-administered medications on file prior to visit.    Allergies  Allergen Reactions  . Celexa [Citalopram Hydrobromide]     psychosis  . Cephalosporins     REACTION: tongue swelling  . Clarithromycin     REACTION: blisters in mouth  . Clarithromycin   . Doxycycline Hyclate     REACTION: nausea, vomiting  . Levofloxacin     REACTION: tongue swells  . Penicillins     REACTION: per patient causes rash,hives  . Zolpidem Tartrate     REACTION: difficulty with concentration   Family History  Problem Relation Age of Onset  . Heart disease Mother   . Hypertension Mother   . Diabetes Mother   . COPD Father   . Colon cancer Neg Hx    PE: BP 122/74 (BP Location: Left Arm, Patient Position:  Sitting)   Pulse 68   Ht 5\' 1"  (1.549 m)   Wt 148 lb (67.1 kg)   SpO2 98%   BMI 27.96 kg/m  Body mass index is 27.96 kg/m. Wt Readings from Last 3 Encounters:  08/18/16 148 lb (67.1 kg)  08/15/16 153 lb 7 oz (69.6 kg)  08/12/16 152 lb (68.9 kg)   Constitutional: slightly overweight, in NAD. No kyphosis. Eyes: PERRLA, EOMI, no exophthalmos ENT: moist mucous membranes, no thyromegaly, no cervical lymphadenopathy Cardiovascular: RRR, No MRG Respiratory: CTA B Gastrointestinal: abdomen soft, NT, ND, BS+ Musculoskeletal: no deformities, strength intact in all 4 Skin: moist, warm, no rashes Neurological: no tremor with outstretched hands, DTR normal in all 4  Assessment: 1. Normocalcemic hyperparathyroidism  2. Vit D deficiency  Plan: 1. And 2. Patient has had 1 instance of minimally elevated calcium per review of the chart, at 10.6. She had few PTH  levels that were elevated in the past, however, more recently, PTH and calcium levels were normal. We reviewed together her calcium, parathyroid hormone and vitamin D level from 2 weeks ago, obtained by PCP. There are all normal. - She also has a h/o vitamin D deficiency, now normalized on ergocalciferol weekly. We'll continue this. - She was started on Calcitriol in 2013,  but she was able to come off since last visit. No need to restart. - At this point, I suggested to return to see me as needed, and I will suggest to only check calcium and vitamin D (and not PTH levels) annually  Philemon Kingdom, MD PhD [facility]

## 2016-08-18 NOTE — Telephone Encounter (Signed)
Patient contacted regarding discharge from [facility] on 08/16/16.  Patient understands to follow up with provider Melina Copa PA-C on 08/24/16 at 1000 at [facility]. Location.  Patient understands discharge instructions? yes Patient understands medications and regiment? yes Patient understands to bring all medications to this visit? yes  Pt has no further questions at this time.  Pt gracious for all the assistance and follow-up provided.

## 2016-08-18 NOTE — Patient Instructions (Addendum)
Please continue Ergocalciferol 50,000 units weekly.  Please continue to have annual calcium levels.  Please schedule another appt as needed.

## 2016-08-20 ENCOUNTER — Ambulatory Visit: Payer: Medicare Other

## 2016-08-21 ENCOUNTER — Encounter: Payer: Self-pay | Admitting: Physician Assistant
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
## 2016-08-24 ENCOUNTER — Ambulatory Visit (INDEPENDENT_AMBULATORY_CARE_PROVIDER_SITE_OTHER): Payer: Medicare Other | Admitting: Physician Assistant

## 2016-08-24 ENCOUNTER — Encounter: Payer: Self-pay | Admitting: Physician Assistant

## 2016-08-24 VITALS — BP 122/82 | HR 75 | Ht 61.0 in | Wt 145.8 lb

## 2016-08-24 DIAGNOSIS — I2510 Atherosclerotic heart disease of native coronary artery without angina pectoris: Secondary | ICD-10-CM | POA: Diagnosis not present

## 2016-08-24 DIAGNOSIS — R739 Hyperglycemia, unspecified: Secondary | ICD-10-CM | POA: Diagnosis not present

## 2016-08-24 DIAGNOSIS — R0602 Shortness of breath: Secondary | ICD-10-CM

## 2016-08-24 DIAGNOSIS — R55 Syncope and collapse: Secondary | ICD-10-CM

## 2016-08-24 DIAGNOSIS — I10 Essential (primary) hypertension: Secondary | ICD-10-CM | POA: Diagnosis not present

## 2016-08-24 DIAGNOSIS — I119 Hypertensive heart disease without heart failure: Secondary | ICD-10-CM

## 2016-08-24 MED ORDER — CLOPIDOGREL BISULFATE 75 MG PO TABS: ORAL_TABLET | ORAL | 3 refills | Status: DC

## 2016-08-24 NOTE — Patient Instructions (Addendum)
Medication Instructions:  Your physician has recommended you make the following change in your medication:  1.  STOP Brilinta 2.  START Plavix 75 mg taking 4 tablets TONIGHT and starting tomorrow take 1 tablet daily   Labwork: None ordered  Testing/Procedures: None ordered  Follow-Up: Your physician recommends that you schedule a follow-up appointment in:  [place], PA-C OR KATIE THOMPSON, PA-C   Any Other Special Instructions Will Be Listed Below (If Applicable).   If you need a refill on your cardiac medications before your next appointment, please call your pharmacy.

## 2016-08-25 ENCOUNTER — Ambulatory Visit (INDEPENDENT_AMBULATORY_CARE_PROVIDER_SITE_OTHER): Payer: Medicare Other | Admitting: Family Medicine

## 2016-08-25 ENCOUNTER — Encounter: Payer: Self-pay | Admitting: Family Medicine

## 2016-08-25 VITALS — BP 122/86 | HR 77 | Temp 98.5°F | Wt 145.2 lb

## 2016-08-25 DIAGNOSIS — J45909 Unspecified asthma, uncomplicated: Secondary | ICD-10-CM | POA: Diagnosis not present

## 2016-08-25 DIAGNOSIS — I10 Essential (primary) hypertension: Secondary | ICD-10-CM | POA: Diagnosis not present

## 2016-08-25 DIAGNOSIS — I25110 Atherosclerotic heart disease of native coronary artery with unstable angina pectoris: Secondary | ICD-10-CM

## 2016-08-25 MED ORDER — BUDESONIDE-FORMOTEROL FUMARATE 160-4.5 MCG/ACT IN AERO: 2.0000 | INHALATION_SPRAY | Freq: Two times a day (BID) | RESPIRATORY_TRACT | 3 refills | Status: DC

## 2016-08-25 MED ORDER — ALBUTEROL SULFATE HFA 108 (90 BASE) MCG/ACT IN AERS: 2.0000 | INHALATION_SPRAY | Freq: Four times a day (QID) | RESPIRATORY_TRACT | 5 refills | Status: DC | PRN

## 2016-08-25 NOTE — Patient Instructions (Signed)
Will send Dayna and Dr. Meda Coffee a message to see if we can get more information on DMV portion  Refilled asthma medications. If shortness of breath does not improve back on this let me know  Blood pressure looks good on repeat

## 2016-08-25 NOTE — Progress Notes (Signed)
Subjective:  Felicia Perry is a 70 y.o. year old very pleasant female patient who presents for/with See problem oriented charting ROS- some shortness of breath in cold weather, no further chest pain, no headache or blurry vision.see any ROS included in HPI as well.   Past Medical History-  Patient Active Problem List   Diagnosis Date Noted  . Coronary artery disease     Priority: High  . H/O hyperparathyroidism 08/18/2016    Priority: Medium  . Vitamin D deficiency 08/20/2015    Priority: Medium  . Chronic pain syndrome 03/26/2015    Priority: Medium  . Insomnia 07/13/2014    Priority: Medium  . GERD (gastroesophageal reflux disease) 02/25/2011    Priority: Medium  . Depression 04/22/2007    Priority: Medium  . Essential hypertension 04/22/2007    Priority: Medium  . Asthma 04/22/2007    Priority: Medium  . Hyperglycemia 08/06/2015    Priority: Low  . Xerosis of skin 08/06/2015    Priority: Low  . Hypersomnia with sleep apnea 03/26/2015    Priority: Low  . Nonallopathic lesion of lumbosacral region 11/13/2014    Priority: Low  . Osteoarthritis of left lower extremity 10/17/2014    Priority: Low  . Chronic meniscal tear of knee 10/17/2014    Priority: Low  . Nonallopathic lesion of thoracic region 09/14/2014    Priority: Low  . Nonallopathic lesion-rib cage 08/21/2014    Priority: Low  . Tendinopathy of right rotator cuff 07/30/2014    Priority: Low  . Piriformis syndrome of right side 07/30/2014    Priority: Low  . Limited joint range of motion 07/13/2014    Priority: Low  . Rash 07/13/2014    Priority: Low  . CERVICAL DISC DISORDER 10/10/2007    Priority: Low  . LOW BACK PAIN 07/01/2007    Priority: Low  . PUD (peptic ulcer disease)   . Enlarged lymph nodes 05/19/2016    Medications- reviewed and updated Current Outpatient Prescriptions  Medication Sig Dispense Refill  . acetaminophen (TYLENOL) 325 MG tablet Take 650 mg by mouth every 6 (six) hours as  needed.    Marland Kitchen alprazolam (XANAX) 2 MG tablet Take 2 mg by mouth at bedtime as needed for sleep. Take 1-1.5 tablets at bedtime    . amLODipine-valsartan (EXFORGE) 10-320 MG tablet TAKE ONE TABLET EACH DAY 30 tablet 5  . aspirin EC 81 MG tablet Take 1 tablet (81 mg total) by mouth daily. 90 tablet 3  . atorvastatin (LIPITOR) 80 MG tablet Take 1 tablet (80 mg total) by mouth daily at 6 PM. 90 tablet 3  . clopidogrel (PLAVIX) 75 MG tablet Take 4 tablets by mouth on day 1 (starting tonight) then take 1 tablet by mouth daily after that 94 tablet 3  . levocetirizine (XYZAL) 5 MG tablet TAKE ONE TABLET EVERY MORNING 30 tablet 11  . propranolol (INDERAL) 40 MG tablet TAKE ONE TABLET BY MOUTH ONCE DAILY 30 tablet 5  . ranitidine (ZANTAC) 150 MG tablet Take 1 tablet (150 mg total) by mouth 2 (two) times daily. 60 tablet 11  . traMADol (ULTRAM) 50 MG tablet Take 100 mg by mouth every morning.     . venlafaxine XR (EFFEXOR-XR) 37.5 MG 24 hr capsule Take 3 capsules (112.5 mg total) by mouth daily with breakfast. 90 capsule 1  . Vitamin D, Ergocalciferol, (DRISDOL) 50000 units CAPS capsule TAKE 1 CAPSULE EVERY 7 DAYS 10 capsule 1  . albuterol (PROVENTIL HFA;VENTOLIN HFA) 108 (90 Base)  MCG/ACT inhaler Inhale 2 puffs into the lungs every 6 (six) hours as needed for wheezing or shortness of breath. 1 Inhaler 5  . budesonide-formoterol (SYMBICORT) 160-4.5 MCG/ACT inhaler Inhale 2 puffs into the lungs 2 (two) times daily. 1 Inhaler 3   No current facility-administered medications for this visit.     Objective: BP 122/86   Pulse 77   Temp 98.5 F (36.9 C) (Oral)   Wt 145 lb 3.2 oz (65.9 kg)   SpO2 96%   BMI 27.44 kg/m  Gen: NAD, resting comfortably, anxious appearing when discussing driving restrictions CV: RRR no murmurs rubs or gallops Lungs: CTAB no crackles, wheeze, rhonchi Ext: no edema Skin: warm, dry, no rash  Assessment/Plan:  Asthma S: Since weather has turned colder patient has found herself  having some shortness of breath. Has not had difficulty doing her housework prior but only when getsout in cold air. Recently ran out of prior synmicort samples and no longer has rescue inhaler A/P: will resume symbicort in winter months. If no improvement in SOB on regimen- patient to return to see me. Would need to consider changing her propanolol.    Essential hypertension S: controlled on  propranolol and valsartan amlodipine 320-10mg  BP Readings from Last 3 Encounters:  08/25/16 122/86  08/24/16 122/82  08/18/16 122/74  A/P:Continue current medications- good control on repeat check. 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   Coronary artery disease S: patient with drug eluting stent x 3 in right coarnary artery- initially presented with chest pain and 2 episodes of exertional syncope (one was running down the stairs to open the door, the other was running around chasing puppies). Still has moderately severe left circumflex stenosis to be treated medically- she is no longer having chest pain. Has some shortness of breath but only in cold weather- see notes on asthma. She is on plavix and aspirin. Concern with brilinta was could it be cuasing some shortness of breath.  A/P:  Continue current therapy- with aspirin, plavix, statin. She has follow up next week with cardiology. The question about TIA - I have no record of this occurring in regards to potential effient.   Patient also asks me with her syncopal episodes about driving. I told her I would reach out to Melina Copa and Dr. Meda Coffee of cardiology for furhter clarificatoin.   Meds ordered this encounter  Medications  . albuterol (PROVENTIL HFA;VENTOLIN HFA) 108 (90 Base) MCG/ACT inhaler    Sig: Inhale 2 puffs into the lungs every 6 (six) hours as needed for wheezing or shortness of breath.    Dispense:  1 Inhaler    Refill:  5  . budesonide-formoterol (SYMBICORT) 160-4.5 MCG/ACT  inhaler    Sig: Inhale 2 puffs into the lungs 2 (two) times daily.    Dispense:  1 Inhaler    Refill:  3   Reinforced no driving for 6 months until further consultation with Dr. Meda Coffee next week  Return precautions advised.  Garret Reddish, MD

## 2016-08-25 NOTE — Progress Notes (Signed)
Pre visit review using our clinic review tool, if applicable. No additional management support is needed unless otherwise documented below in the visit note. 

## 2016-08-26 NOTE — Assessment & Plan Note (Signed)
S: Since weather has turned colder patient has found herself having some shortness of breath. Has not had difficulty doing her housework prior but only when getsout in cold air. Recently ran out of prior synmicort samples and no longer has rescue inhaler A/P: will resume symbicort in winter months. If no improvement in SOB on regimen- patient to return to see me. Would need to consider changing her propanolol.

## 2016-08-26 NOTE — Progress Notes (Signed)
Cardiology Office Note    Date:  08/31/2016   ID:  Reily, Havner 05-Dec-1945, MRN [id number]  PCP:  Garret Reddish, MD  Cardiologist:  Dr. Meda Coffee  CC: 1 week follow up after switching Brilinta to plavix  History of Present Illness:  Felicia Perry is a 70 y.o. female with a history of recently diagnosed CAD (RCA CTO s/p complex PCI, 60% LCx disease, normal LVEF 08/14/16), HTN, PUD, asthma, anxiety who presents for follow up.  Recently admitted for abnormal stress test (done for recurrent syncope) and unstable angina, with PCI as above - her Cx disease was treated medically with recommendation to consider staged PCI if she had continued symptoms, LVEDP 15. DAPT for at least 1 year recommended. She was discharged with rx for Brilinta and high dose statin. 2D ceho 08/07/16: EF 55-60%, mild LVH, grade 1 DD, calcified MV annulus.  She lives at home with her addicted (heroin) son. This causes a lot of anxiety.   She was seen by Melina Copa PA-C in the office for post hospital follow up on 08/24/16. She reported significant exertional dyspnea ever since her PCI. This was felt possibly related to Brilinta and she was switched to plavix 75mg  daily with a 300mg  daily load (no effient with possible hx of TIA/CVA).  She was told not to drive given hx of syncope.   She saw Dr. Yong Channel, PCP,  on 08/25/16. No changes were made.   Today she present to clinic for follow up. She can breath normally now and feeling so relieved about this. Within 24 hours of switching to plavix she was feeling completely better. She has been able to cook and do some house cleaning. She walks the dogs. 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 No chest pain or SOB. No LE edema, orthopnea or PND. Her appetite is back. No dizziness or syncope. She has had some heaviness her legs that she is related to the statin. No smoking.     Past Medical History:  Diagnosis Date  . Adenomatous polyp 11/23/2005  .  Anxiety   . Coronary artery disease    a. HC on 08/14/16 showed RCA CTO s/p PCI, 60% LCX disease managed medically with recs to consider staged PCI if continued symptoms.   Marland Kitchen DIVERTICULOSIS, COLON 04/22/2007       . GERD (gastroesophageal reflux disease)   . History of shingles   . Hypertension   . Hypertensive heart disease   . Internal hemorrhoids   . Osteoarthritis    "knees, hands, lower back" (08/14/2016)  . PUD (peptic ulcer disease)   . Sleep apnea    a. resolved with weight loss    Past Surgical History:  Procedure Laterality Date  . BREAST SURGERY Left 1980s   Fibrous Tumors removed   . CARDIAC CATHETERIZATION N/A 08/14/2016   Procedure: Left Heart Cath and Coronary Angiography;  Surgeon: Sherren Mocha, MD;  Location: [facility];  Service: Cardiovascular;  Laterality: N/A;  . CARDIAC CATHETERIZATION N/A 08/14/2016   Procedure: Coronary Stent Intervention;  Surgeon: Sherren Mocha, MD;  Location: [facility];  Service: Cardiovascular;  Laterality: N/A;  . CATARACT EXTRACTION W/ INTRAOCULAR LENS  IMPLANT, BILATERAL Bilateral 05/2009  . CORONARY ANGIOPLASTY    . TUBAL LIGATION  1970s    Current Medications: Outpatient Medications Prior to Visit  Medication Sig Dispense Refill  . acetaminophen (TYLENOL) 325 MG tablet Take 650 mg by mouth every 6 (  six) hours as needed.    Marland Kitchen albuterol (PROVENTIL HFA;VENTOLIN HFA) 108 (90 Base) MCG/ACT inhaler Inhale 2 puffs into the lungs every 6 (six) hours as needed for wheezing or shortness of breath. 1 Inhaler 5  . alprazolam (XANAX) 2 MG tablet Take 2 mg by mouth at bedtime as needed for sleep. Take 1-1.5 tablets at bedtime    . amLODipine-valsartan (EXFORGE) 10-320 MG tablet TAKE ONE TABLET EACH DAY 30 tablet 5  . aspirin EC 81 MG tablet Take 1 tablet (81 mg total) by mouth daily. 90 tablet 3  . atorvastatin (LIPITOR) 80 MG tablet Take 1 tablet (80 mg total) by mouth daily at 6 PM. 90 tablet 3  . budesonide-formoterol  (SYMBICORT) 160-4.5 MCG/ACT inhaler Inhale 2 puffs into the lungs 2 (two) times daily. 1 Inhaler 3  . clopidogrel (PLAVIX) 75 MG tablet Take 4 tablets by mouth on day 1 (starting tonight) then take 1 tablet by mouth daily after that 94 tablet 3  . levocetirizine (XYZAL) 5 MG tablet TAKE ONE TABLET EVERY MORNING 30 tablet 11  . propranolol (INDERAL) 40 MG tablet TAKE ONE TABLET BY MOUTH ONCE DAILY 30 tablet 5  . ranitidine (ZANTAC) 150 MG tablet Take 1 tablet (150 mg total) by mouth 2 (two) times daily. 60 tablet 11  . traMADol (ULTRAM) 50 MG tablet Take 100 mg by mouth every morning.     . venlafaxine XR (EFFEXOR-XR) 37.5 MG 24 hr capsule TAKE 3 CAPSULES DAILY WITH BREAKFAST 90 capsule 0  . Vitamin D, Ergocalciferol, (DRISDOL) 50000 units CAPS capsule TAKE 1 CAPSULE EVERY 7 DAYS 10 capsule 1   No facility-administered medications prior to visit.      Allergies:   Celexa [citalopram hydrobromide]; Cephalosporins; Clarithromycin; Clarithromycin; Doxycycline hyclate; Levofloxacin; Penicillins; and Zolpidem tartrate   Social History   Social History  . Marital status: Divorced    Spouse name: N/A  . Number of children: N/A  . Years of education: N/A   Occupational History  . Cleaning    Social History Main Topics  . Smoking status: Former Smoker    Packs/day: 1.00    Years: 26.00    Types: Cigarettes    Quit date: 1989  . Smokeless tobacco: Never Used  . Alcohol use No  . Drug use: No  . Sexual activity: Not Currently   Other Topics Concern  . None   Social History Narrative   Divorced for 38 years in 2016. 2 sons.  2 granddaughters.       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       Hobbies: watch tv-survivor, dancing with the stars, enjoy alone time. Best friend [name] patient of Dr. Yong Channel. Enjoys work, time on Teaching laboratory technician.      Family History:  The patient's family history includes COPD in her father; Diabetes in her mother;  Heart disease in her mother; Hypertension in her mother.     ROS:   Please see the history of present illness.    ROS All other systems reviewed and are negative.   PHYSICAL EXAM:   VS:  BP 136/74   Pulse 66   Ht 5\' 1"  (1.549 m)   Wt 150 lb 12.8 oz (68.4 kg)   BMI 28.49 kg/m    GEN: Well nourished, well developed, in no acute distress  HEENT: normal  Neck: no JVD, carotid bruits, or masses Cardiac: RRR; no murmurs, rubs, or gallops,no edema  Respiratory:  clear to auscultation bilaterally, normal work of breathing GI: soft, nontender, nondistended, + BS MS: no deformity or atrophy  Skin: warm and dry, no rash Neuro:  Alert and Oriented x 3, Strength and sensation are intact Psych: euthymic mood, full affect  Wt Readings from Last 3 Encounters:  08/31/16 150 lb 12.8 oz (68.4 kg)  08/25/16 145 lb 3.2 oz (65.9 kg)  08/24/16 145 lb 12.8 oz (66.1 kg)      Studies/Labs Reviewed:   EKG:  EKG is NOT ordered today.    Recent Labs: 08/04/2016: ALT 10 08/16/2016: BUN 14; Creatinine, Ser 0.94; Hemoglobin 14.7; Platelets 270; Potassium 3.7; Sodium 138   Lipid Panel    Component Value Date/Time   CHOL 140 01/29/2015 0807   TRIG 113.0 01/29/2015 0807   HDL 25.50 (L) 01/29/2015 0807   CHOLHDL 5 01/29/2015 0807   VLDL 22.6 01/29/2015 0807   LDLCALC 92 01/29/2015 0807   LDLDIRECT 91.9 02/28/2007 0947    Additional studies/ records that were reviewed today include:   Procedures 08/14/16 Coronary Stent Intervention  Left Heart Cath and Coronary Angiography  Conclusion  1. Severe stenosis with chronic total occlusion of the mid right coronary artery, treated successfully with complex stenting as detailed 2. Moderately severe stenosis of the mid left circumflex involving a large obtuse marginal branch 3. Patency of the LAD with mild nonobstructive disease 4. Normal LV function by noninvasive assessment  Recommendations: Dual antiplatelet therapy with aspirin and brilinta for  at least one year. Consider staged PCI of the left circumflex bifurcation if recurrent anginal symptoms arise.     2D ECHO 08/07/16: EF 55-60%, mild LVH, grade 1 DD, calcified MV annulus.   ASSESSMENT & PLAN:   Dyspnea: resolved off the Brilinta.   CAD: continue ASA/plavix, statin and BB. She has a history of ulcers and will be maintained on the enteric coated aspirin  HTN: BP well controlled on current regimen  H/o Syncope: no driving for 6 months after event. A exercise myoview has been arranged by Dr. Meda Coffee. If she passes then she will be cleared to drive  Asthma: followed by PCP.   Medication Adjustments/Labs and Tests Ordered: Current medicines are reviewed at length with the patient today.  Concerns regarding medicines are outlined above.  Medication changes, Labs and Tests ordered today are listed in the Patient Instructions below. Patient Instructions  Medication Instructions:  Your physician recommends that you continue on your current medications as directed. Please refer to the Current Medication list given to you today.  Labwork: NONE  Testing/Procedures: Your physician has requested that you have en exercise stress myoview. For further information please visit [email or web address]. Please follow instruction sheet, as given.   Follow-Up: Your physician wants you to follow-up in: 6 months with Dr. Meda Coffee. You will receive a reminder letter in the mail two months in advance. If you don't receive a letter, please call our office to schedule the follow-up appointment.   If you need a refill on your cardiac medications before your next appointment, please call your pharmacy.      Signed, Angelena Form, PA-C  08/31/2016 9:58 AM    [facility] [address], [place], [place]  [address] Phone: [phone]; Fax: [phone]

## 2016-08-26 NOTE — Assessment & Plan Note (Signed)
S: controlled on  propranolol and valsartan amlodipine 320-10mg  BP Readings from Last 3 Encounters:  08/25/16 122/86  08/24/16 122/82  08/18/16 122/74  A/P:Continue current medications- good control on repeat check. 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

## 2016-08-26 NOTE — Assessment & Plan Note (Signed)
S: patient with drug eluting stent x 3 in right coarnary artery- initially presented with chest pain and 2 episodes of exertional syncope (one was running down the stairs to open the door, the other was running around chasing puppies). Still has moderately severe left circumflex stenosis to be treated medically- she is no longer having chest pain. Has some shortness of breath but only in cold weather- see notes on asthma. She is on plavix and aspirin. Concern with brilinta was could it be cuasing some shortness of breath.  A/P:  Continue current therapy- with aspirin, plavix, statin. She has follow up next week with cardiology. The question about TIA - I have no record of this occurring in regards to potential effient.   Patient also asks me with her syncopal episodes about driving. I told her I would reach out to Melina Copa and Dr. Meda Coffee of cardiology for furhter clarificatoin.

## 2016-08-27 ENCOUNTER — Other Ambulatory Visit: Payer: Self-pay | Admitting: Family Medicine

## 2016-08-27 NOTE — Telephone Encounter (Signed)
Refill done.  

## 2016-08-28 NOTE — Progress Notes (Signed)
Felicia Perry, this makes sense to me- that although CAD may have been the cause it is certainly not definitive. 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 Really appreciate your thoughts and thoroughness!   Garret Reddish

## 2016-08-28 NOTE — Telephone Encounter (Signed)
-----   Message from Dorothy Spark, MD sent at 08/28/2016 12:42 PM EDT ----- Dr Yong Channel,  There is a law that doesn't allow  The patients to drive for 6 months after syncopal episodes unless we identify and treat reversible cause. In her case she had 2 episodes of exertional syncope, however we are not 100% sure that this was just because of her myocardial ischemia. Considering that she had stents placed, we would still need to perform an exercise treadmill stress test to evaluate her exertional symptoms and if negative we can clear her for driving. That being said, it is still up to [facility] to decide if they give her license within 6 months after the syncopal episode.  Ivy, please arrange an exercise treadmill stress test.  Ena Dawley    ----- Message ----- From: Marin Olp, MD Sent: 08/26/2016   2:45 PM To: Dorothy Spark, MD  Hey Dr. Meda Coffee and Lisbeth Renshaw,   Thank you both for caring for [name]. I think both of you are far more aware of restrictions surrounding syncope/arythmias -I was wondering if you could provide a reference to [place] law on driving restrictions with syncope/seizure/arrythmia. The only thing I can find is a 11 page document online that is not very clear. I want to get better at discussing with patients in situations like this.    In regards to [name]. She is worried if she cannot drive she will lose her license and then she will not be able to insure her cars. 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 My other question was if syncope was likely due to ischemia or arhythmia from ischemia and she has had most serious lesions stented- would that lower her risk of recurrence and allow sooner restart of driving? Just wanted your thoughts.   Thanks, Garret Reddish

## 2016-08-28 NOTE — Telephone Encounter (Signed)
Notified the pt that Dr Meda Coffee was contacted by Dr Yong Channel today, concerning the pts history of exertional syncope and driving restrictions.  Informed the pt that Dr Meda Coffee recommends that in order to consider clearing her to drive a vehicle by the [place], with having a history of exertional syncope, she would need to order another nuclear exercise stress test for the pt to have done.  Informed the pt that she recommends ordering the exercise nuclear stress test to be done to evaluate her exertional symptoms, and if negative, we can clear her for driving.  Did inform the pt that per Dr Meda Coffee, if clearance is provided, its still up to the [facility] to decide if they give her, her license within 6  Months after the syncopal episode.   Informed the pt that I will place the order in the system, and send our [facility] schedulers a message to call her on Monday 11/6 and arrange for this to be done.   Per the pt, she states she has a follow-up appt at our office with Nell Range PA-C, for follow-up of recent stents.  Informed the pt that before or after she see's Valetta Fuller, she should request to speak with a checkout rep and have this test scheduled.   Went over the pts myoview instructions with her over the phone, and informed her that I will leave the instructions at check out for her to pick up when she comes in on next Monday.   Informed the pt that I will route this message to [facility] and [facility] Scheduling, to make them aware of test needed and to have this scheduled when the pt comes in on Monday.   Will also route this to covering RN with Valetta Fuller on Monday, to make sure pt receives instructions and schedules this test.  Pt verbalized understanding, agrees with this plan, and gracious for all the assistance provided.

## 2016-08-28 NOTE — Progress Notes (Signed)
Hi Dr. Yong Channel, I really appreciate you taking the time to write. It has been my experience as an APP working under our docs that patients with syncope are in general advised to obtain from driving for 6 months. This is obviously true in people who had documented VT/VF, but in general our EP doctors advise this for unexplained syncope as well. One could pose the idea that fixing a coronary lesion could potentially prevent recurrent syncope from occurring, but this infers that the syncope was definitely related to the lesion itself... which unfortunately sometimes is not the case. We've had folks who had a red herring of CAD but then ended up actually having a seizure disorder. The document you're referring to (I think) is the one that comes up in our practice, citing:  "Taking all available scientific evidence into consideration, it is prudent to recommend that all persons should be free of syncopal episodes for at least six months to be granted the driving privilege, and specifically that persons who have had an episode of ventricular fibrillation or ventricular tachycardia should be free of these events for at least six months before resuming driving.51,52,53 By analogy, it also seems logical to require persons who have had cardiac bypass surgery, coronary angioplasty, or myocardial infarction to be free of syncope for at least six months before resuming driving."  You're right though, it's not directly cited in an obvious place on the [facility] website. This is easily the worst part of my job, sometimes taken harder by patients than telling them they had a heart attack :( I will forward your message to Dr. Meda Coffee to get her thoughts.  Dayna Dunn PA-C

## 2016-08-31 ENCOUNTER — Ambulatory Visit (INDEPENDENT_AMBULATORY_CARE_PROVIDER_SITE_OTHER): Payer: Medicare Other | Admitting: Physician Assistant

## 2016-08-31 ENCOUNTER — Encounter: Payer: Self-pay | Admitting: Physician Assistant

## 2016-08-31 VITALS — BP 136/74 | HR 66 | Ht 61.0 in | Wt 150.8 lb

## 2016-08-31 DIAGNOSIS — I10 Essential (primary) hypertension: Secondary | ICD-10-CM | POA: Diagnosis not present

## 2016-08-31 DIAGNOSIS — I119 Hypertensive heart disease without heart failure: Secondary | ICD-10-CM | POA: Diagnosis not present

## 2016-08-31 DIAGNOSIS — R0600 Dyspnea, unspecified: Secondary | ICD-10-CM

## 2016-08-31 DIAGNOSIS — Z87898 Personal history of other specified conditions: Secondary | ICD-10-CM

## 2016-08-31 NOTE — Patient Instructions (Addendum)
Medication Instructions:  Your physician recommends that you continue on your current medications as directed. Please refer to the Current Medication list given to you today.  Labwork: NONE  Testing/Procedures: Your physician has requested that you have en exercise stress myoview. For further information please visit [email or web address]. Please follow instruction sheet, as given.   Follow-Up: Your physician wants you to follow-up as already scheduled with Dr. Meda Coffee.   If you need a refill on your cardiac medications before your next appointment, please call your pharmacy.

## 2016-09-01 ENCOUNTER — Ambulatory Visit
Admission: RE | Admit: 2016-09-01 | Discharge: 2016-09-01 | Disposition: A | Discharge status: Home / Self Care | Payer: Medicare Other | Source: Ambulatory Visit | Attending: Family Medicine | Admitting: Family Medicine

## 2016-09-01 ENCOUNTER — Telehealth (HOSPITAL_COMMUNITY): Payer: Self-pay | Admitting: *Deleted

## 2016-09-01 DIAGNOSIS — Z1231 Encounter for screening mammogram for malignant neoplasm of breast: Secondary | ICD-10-CM

## 2016-09-01 NOTE — Telephone Encounter (Signed)
Patient given detailed instructions per Myocardial Perfusion Study Information Sheet for the test on 09/03/16 Patient notified to arrive 15 minutes early and that it is imperative to arrive on time for appointment to keep from having the test rescheduled.  If you need to cancel or reschedule your appointment, please call the office within 24 hours of your appointment. Failure to do so may result in a cancellation of your appointment, and a $50 no show fee. Patient verbalized understanding. Hubbard Robinson, RN

## 2016-09-02 DIAGNOSIS — R0600 Dyspnea, unspecified: Secondary | ICD-10-CM | POA: Insufficient documentation

## 2016-09-02 DIAGNOSIS — R0609 Other forms of dyspnea: Secondary | ICD-10-CM | POA: Insufficient documentation

## 2016-09-03 ENCOUNTER — Telehealth: Payer: Self-pay | Admitting: *Deleted

## 2016-09-03 ENCOUNTER — Telehealth: Payer: Self-pay | Admitting: Family Medicine

## 2016-09-03 ENCOUNTER — Ambulatory Visit (HOSPITAL_COMMUNITY): Payer: Medicare Other | Attending: Cardiology

## 2016-09-03 DIAGNOSIS — R9439 Abnormal result of other cardiovascular function study: Secondary | ICD-10-CM | POA: Diagnosis not present

## 2016-09-03 DIAGNOSIS — R55 Syncope and collapse: Secondary | ICD-10-CM | POA: Insufficient documentation

## 2016-09-03 LAB — MYOCARDIAL PERFUSION IMAGING
Estimated workload: 7 METS
Exercise duration (min): 5 min
Exercise duration (sec): 50 s
LV dias vol: 53 mL (ref 46–106)
LV sys vol: 14 mL
MPHR: 150 {beats}/min
Peak HR: 134 {beats}/min
Percent HR: 1 %
RATE: 0.25
RPE: 16
Rest HR: 64 {beats}/min
SDS: 4
SRS: 4
SSS: 8
TID: 0.83

## 2016-09-03 MED ORDER — TECHNETIUM TC 99M TETROFOSMIN IV KIT
31.6000 | PACK | Freq: Once | INTRAVENOUS | Status: AC | PRN
Start: 2016-09-03 — End: 2016-09-03
  Administered 2016-09-03: 31.6 via INTRAVENOUS
  Filled 2016-09-03: qty 32

## 2016-09-03 MED ORDER — HYDROCHLOROTHIAZIDE 25 MG PO TABS: 25.0000 mg | ORAL_TABLET | Freq: Every day | ORAL | 3 refills | Status: DC

## 2016-09-03 MED ORDER — TECHNETIUM TC 99M TETROFOSMIN IV KIT
10.2000 | PACK | Freq: Once | INTRAVENOUS | Status: AC | PRN
  Administered 2016-09-03: 10.2 via INTRAVENOUS
  Filled 2016-09-03: qty 11

## 2016-09-03 NOTE — Telephone Encounter (Signed)
° °  Pt call to say she passed her stress test today and now she is needing a written statement to say that it is ok for her to drive to send to the dept of motor vehicle.   [phone]

## 2016-09-03 NOTE — Telephone Encounter (Signed)
Has she received any information that she is not able to drive from [facility]? It was not clear that this was every submitted to me. I am happy to write a statement but just want to make sure it is needed

## 2016-09-03 NOTE — Telephone Encounter (Signed)
-----   Message from Dorothy Spark, MD sent at 09/03/2016  5:04 PM EST ----- Yes, she can drive.  No losartan, but hydrochlorothiazide 25 mg po daily.  Thank you, [place] Siren

## 2016-09-03 NOTE — Telephone Encounter (Signed)
Notified the pt that per Dr Meda Coffee, she can drive now.  Also informed the pt that per Dr Meda Coffee, clarification was provided if she should take Exforge in addition to new regimen advised, Losartan.  Informed the pt that Dr Meda Coffee wants her to stay on  Exforge and start taking HCTZ 25 mg po daily, and no losartan.  Confirmed the pharmacy of choice with the pt.  Pt verbalized understanding and agrees with this plan. Will route Dr Yong Channel PCP her stress test results per Dr Meda Coffee.

## 2016-09-04 NOTE — Telephone Encounter (Signed)
Spoke with patient and stated that she has been cleared from cardiology to drive. Informed her that if we receive anything from the [facility] that we would complete it.

## 2016-10-08 ENCOUNTER — Encounter: Payer: Self-pay | Admitting: Cardiology

## 2016-10-09 ENCOUNTER — Other Ambulatory Visit: Payer: Self-pay | Admitting: Family Medicine

## 2016-10-12 ENCOUNTER — Encounter: Payer: Medicare Other | Admitting: Gastroenterology

## 2016-10-12 NOTE — Telephone Encounter (Signed)
Refill done.  

## 2016-10-20 ENCOUNTER — Ambulatory Visit (INDEPENDENT_AMBULATORY_CARE_PROVIDER_SITE_OTHER): Payer: Medicare Other | Admitting: Cardiology

## 2016-10-20 ENCOUNTER — Encounter: Payer: Self-pay | Admitting: Cardiology

## 2016-10-20 ENCOUNTER — Other Ambulatory Visit: Payer: Self-pay | Admitting: Cardiology

## 2016-10-20 VITALS — BP 124/60 | HR 62 | Ht 61.0 in | Wt 157.0 lb

## 2016-10-20 DIAGNOSIS — I2510 Atherosclerotic heart disease of native coronary artery without angina pectoris: Secondary | ICD-10-CM | POA: Diagnosis not present

## 2016-10-20 DIAGNOSIS — E782 Mixed hyperlipidemia: Secondary | ICD-10-CM

## 2016-10-20 DIAGNOSIS — I10 Essential (primary) hypertension: Secondary | ICD-10-CM | POA: Diagnosis not present

## 2016-10-20 DIAGNOSIS — Z789 Other specified health status: Secondary | ICD-10-CM | POA: Diagnosis not present

## 2016-10-20 LAB — TSH: TSH: 1.37 mIU/L

## 2016-10-20 LAB — CBC WITH DIFFERENTIAL/PLATELET
Basophils Absolute: 81 cells/uL (ref 0–200)
Basophils Relative: 1 %
Eosinophils Absolute: 324 cells/uL (ref 15–500)
Eosinophils Relative: 4 %
HCT: 40.3 % (ref 35.0–45.0)
Hemoglobin: 13.5 g/dL (ref 11.7–15.5)
Lymphocytes Relative: 28 %
Lymphs Abs: 2268 cells/uL (ref 850–3900)
MCH: 29.7 pg (ref 27.0–33.0)
MCHC: 33.5 g/dL (ref 32.0–36.0)
MCV: 88.6 fL (ref 80.0–100.0)
MPV: 9.3 fL (ref 7.5–12.5)
Monocytes Absolute: 648 cells/uL (ref 200–950)
Monocytes Relative: 8 %
Neutro Abs: 4779 cells/uL (ref 1500–7800)
Neutrophils Relative %: 59 %
Platelets: 360 10*3/uL (ref 140–400)
RBC: 4.55 MIL/uL (ref 3.80–5.10)
RDW: 12.9 % (ref 11.0–15.0)
WBC: 8.1 10*3/uL (ref 3.8–10.8)

## 2016-10-20 LAB — COMPREHENSIVE METABOLIC PANEL
ALT: 13 U/L (ref 6–29)
AST: 18 U/L (ref 10–35)
Albumin: 3.8 g/dL (ref 3.6–5.1)
Alkaline Phosphatase: 97 U/L (ref 33–130)
BUN: 17 mg/dL (ref 7–25)
CO2: 28 mmol/L (ref 20–31)
Calcium: 8.7 mg/dL (ref 8.6–10.4)
Chloride: 102 mmol/L (ref 98–110)
Creat: 0.94 mg/dL — ABNORMAL HIGH (ref 0.60–0.93)
Glucose, Bld: 123 mg/dL — ABNORMAL HIGH (ref 65–99)
Potassium: 4.1 mmol/L (ref 3.5–5.3)
Sodium: 139 mmol/L (ref 135–146)
Total Bilirubin: 0.3 mg/dL (ref 0.2–1.2)
Total Protein: 6.3 g/dL (ref 6.1–8.1)

## 2016-10-20 LAB — LIPID PANEL
Cholesterol: 98 mg/dL (ref <200)
HDL: 34 mg/dL — ABNORMAL LOW (ref >50)
LDL Cholesterol: 41 mg/dL (ref <100)
Total CHOL/HDL Ratio: 2.9 Ratio (ref <5.0)
Triglycerides: 117 mg/dL (ref <150)
VLDL: 23 mg/dL (ref <30)

## 2016-10-20 MED ORDER — PRAVASTATIN SODIUM 20 MG PO TABS: 20.0000 mg | ORAL_TABLET | Freq: Every evening | ORAL | 11 refills | Status: DC

## 2016-10-20 NOTE — Patient Instructions (Signed)
Medication Instructions:  STOP Atorvastatin (Lipitor) START Pravastatin (Pravachol) 20 mg once daily   Labwork: TODAY - cholesterol, complete metabolic panel   Testing/Procedures: None Ordered   Follow-Up: Your physician recommends that you schedule a follow-up appointment in: 2 months with Dr. Meda Coffee.    If you need a refill on your cardiac medications before your next appointment, please call your pharmacy.   Thank you for choosing [facility]! Christen Bame, RN [phone]

## 2016-10-20 NOTE — Progress Notes (Signed)
Cardiology Office Note    Date:  10/20/2016   ID:  Mardell, Schuldt 06-Jul-1946, MRN [id number]  PCP:  Garret Reddish, MD  Cardiologist:  Dr. Meda Coffee  CC: 1 week follow up after switching Brilinta to plavix  History of Present Illness:  Felicia Perry is a 70 y.o. female with a history of recently diagnosed CAD (RCA CTO s/p complex PCI, 60% LCx disease, normal LVEF 08/14/16), HTN, PUD, asthma, anxiety who presents for follow up.  Recently admitted for abnormal stress test (done for recurrent syncope) and unstable angina, with PCI as above - her Cx disease was treated medically with recommendation to consider staged PCI if she had continued symptoms, LVEDP 15. DAPT for at least 1 year recommended. She was discharged with rx for Brilinta and high dose statin. 2D ceho 08/07/16: EF 55-60%, mild LVH, grade 1 DD, calcified MV annulus.  She lives at home with her addicted (heroin) son. This causes a lot of anxiety.   She was seen by Melina Copa PA-C in the office for post hospital follow up on 08/24/16. She reported significant exertional dyspnea ever since her PCI. This was felt possibly related to Brilinta and she was switched to plavix 75mg  daily with a 300mg  daily load (no effient with possible hx of TIA/CVA).  She was told not to drive given hx of syncope.  She saw Dr. Yong Channel, PCP,  on 08/25/16. No changes were made.   08/31/2016 - She can breath normally now and feeling so relieved about this. Within 24 hours of switching to plavix she was feeling completely better. She has been able to cook and do some house cleaning. She walks the dogs. 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 No chest pain or SOB. No LE edema, orthopnea or PND. Her appetite is back. No dizziness or syncope. She has had some heaviness her legs that she is related to the statin. No smoking.   10/20/2016 - the patient is coming after 6 weeks, she continues to have atypical upper midsternal right-sided  pressure-like pain, these are not related to exertion, she continues to work 8-10 hour days now feels more tired than before. Her shortness of breath with exertion has significantly improved. She just overall feels very anxious and depressed about the situation. She's been compliant with her meds but has not the significant muscle and joint pain since starting Lipitor. No palpitations or syncope. No lower extremity edema but significant calf pain.   Past Medical History:  Diagnosis Date  . Adenomatous polyp 11/23/2005  . Anxiety   . Coronary artery disease    a. HC on 08/14/16 showed RCA CTO s/p PCI, 60% LCX disease managed medically with recs to consider staged PCI if continued symptoms.   Marland Kitchen DIVERTICULOSIS, COLON 04/22/2007       . GERD (gastroesophageal reflux disease)   . History of shingles   . Hypertension   . Hypertensive heart disease   . Internal hemorrhoids   . Osteoarthritis    "knees, hands, lower back" (08/14/2016)  . PUD (peptic ulcer disease)   . Sleep apnea    a. resolved with weight loss    Past Surgical History:  Procedure Laterality Date  . BREAST SURGERY Left 1980s   Fibrous Tumors removed   . CARDIAC CATHETERIZATION N/A 08/14/2016   Procedure: Left Heart Cath and Coronary Angiography;  Surgeon: Sherren Mocha, MD;  Location: [place] [facility];  Service: Cardiovascular;  Laterality: N/A;  .  CARDIAC CATHETERIZATION N/A 08/14/2016   Procedure: Coronary Stent Intervention;  Surgeon: Sherren Mocha, MD;  Location: [place] [facility];  Service: Cardiovascular;  Laterality: N/A;  . CATARACT EXTRACTION W/ INTRAOCULAR LENS  IMPLANT, BILATERAL Bilateral 05/2009  . CORONARY ANGIOPLASTY    . TUBAL LIGATION  1970s    Current Medications: Outpatient Medications Prior to Visit  Medication Sig Dispense Refill  . acetaminophen (TYLENOL) 325 MG tablet Take 650 mg by mouth every 6 (six) hours as needed.    Marland Kitchen albuterol (PROVENTIL HFA;VENTOLIN HFA) 108 (90 Base) MCG/ACT inhaler  Inhale 2 puffs into the lungs every 6 (six) hours as needed for wheezing or shortness of breath. 1 Inhaler 5  . alprazolam (XANAX) 2 MG tablet Take 2 mg by mouth at bedtime as needed for sleep. Take 1-1.5 tablets at bedtime    . amLODipine-valsartan (EXFORGE) 10-320 MG tablet TAKE ONE TABLET EACH DAY 30 tablet 5  . aspirin EC 81 MG tablet Take 1 tablet (81 mg total) by mouth daily. 90 tablet 3  . budesonide-formoterol (SYMBICORT) 160-4.5 MCG/ACT inhaler Inhale 2 puffs into the lungs 2 (two) times daily. 1 Inhaler 3  . clopidogrel (PLAVIX) 75 MG tablet Take 4 tablets by mouth on day 1 (starting tonight) then take 1 tablet by mouth daily after that 94 tablet 3  . hydrochlorothiazide (HYDRODIURIL) 25 MG tablet Take 1 tablet (25 mg total) by mouth daily. 90 tablet 3  . levocetirizine (XYZAL) 5 MG tablet TAKE ONE TABLET EVERY MORNING 30 tablet 11  . propranolol (INDERAL) 40 MG tablet TAKE ONE TABLET EACH DAY 30 tablet 5  . ranitidine (ZANTAC) 150 MG tablet Take 1 tablet (150 mg total) by mouth 2 (two) times daily. 60 tablet 11  . traMADol (ULTRAM) 50 MG tablet Take 100 mg by mouth every morning.     . venlafaxine XR (EFFEXOR-XR) 37.5 MG 24 hr capsule TAKE 3 CAPSULES DAILY WITH BREAKFAST 90 capsule 5  . Vitamin D, Ergocalciferol, (DRISDOL) 50000 units CAPS capsule TAKE 1 CAPSULE EVERY 7 DAYS 10 capsule 1  . atorvastatin (LIPITOR) 80 MG tablet Take 1 tablet (80 mg total) by mouth daily at 6 PM. 90 tablet 3   No facility-administered medications prior to visit.      Allergies:   Celexa [citalopram hydrobromide]; Cephalosporins; Clarithromycin; Clarithromycin; Doxycycline hyclate; Levofloxacin; Penicillins; and Zolpidem tartrate   Social History   Social History  . Marital status: Divorced    Spouse name: N/A  . Number of children: N/A  . Years of education: N/A   Occupational History  . Cleaning    Social History Main Topics  . Smoking status: Former Smoker    Packs/day: 1.00    Years:  26.00    Types: Cigarettes    Quit date: 1989  . Smokeless tobacco: Never Used  . Alcohol use No  . Drug use: No  . Sexual activity: Not Currently   Other Topics Concern  . None   Social History Narrative   Divorced for 38 years in 2016. 2 sons.  2 granddaughters.       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       Hobbies: watch tv-survivor, dancing with the stars, enjoy alone time. Best friend [name] patient of Dr. Yong Channel. Enjoys work, time on Teaching laboratory technician.      Family History:  The patient's family history includes COPD in her father; Diabetes in her mother; Heart disease in her mother;  Hypertension in her mother.     ROS:   Please see the history of present illness.    ROS All other systems reviewed and are negative.   PHYSICAL EXAM:   VS:  BP 124/60   Pulse 62   Ht 5\' 1"  (1.549 m)   Wt 157 lb (71.2 kg)   BMI 29.66 kg/m    GEN: Well nourished, well developed, in no acute distress  HEENT: normal  Neck: no JVD, carotid bruits, or masses Cardiac: RRR; no murmurs, rubs, or gallops,no edema  Respiratory:  clear to auscultation bilaterally, normal work of breathing GI: soft, nontender, nondistended, + BS MS: no deformity or atrophy  Skin: warm and dry, no rash Neuro:  Alert and Oriented x 3, Strength and sensation are intact Psych: euthymic mood, full affect  Wt Readings from Last 3 Encounters:  10/20/16 157 lb (71.2 kg)  09/03/16 150 lb (68 kg)  08/31/16 150 lb 12.8 oz (68.4 kg)      Studies/Labs Reviewed:   EKG:  EKG is NOT ordered today.    Recent Labs: 08/04/2016: ALT 10 08/16/2016: BUN 14; Creatinine, Ser 0.94; Hemoglobin 14.7; Platelets 270; Potassium 3.7; Sodium 138   Lipid Panel    Component Value Date/Time   CHOL 140 01/29/2015 0807   TRIG 113.0 01/29/2015 0807   HDL 25.50 (L) 01/29/2015 0807   CHOLHDL 5 01/29/2015 0807   VLDL 22.6 01/29/2015 0807   LDLCALC 92 01/29/2015 0807   LDLDIRECT 91.9  02/28/2007 0947    Additional studies/ records that were reviewed today include:   Procedures 08/14/16 Coronary Stent Intervention  Left Heart Cath and Coronary Angiography  Conclusion  1. Severe stenosis with chronic total occlusion of the mid right coronary artery, treated successfully with complex stenting as detailed 2. Moderately severe stenosis of the mid left circumflex involving a large obtuse marginal branch 3. Patency of the LAD with mild nonobstructive disease 4. Normal LV function by noninvasive assessment  Recommendations: Dual antiplatelet therapy with aspirin and brilinta for at least one year. Consider staged PCI of the left circumflex bifurcation if recurrent anginal symptoms arise.    2D ECHO 08/07/16: EF 55-60%, mild LVH, grade 1 DD, calcified MV annulus.    ASSESSMENT & PLAN:   Dyspnea: resolved off the Brilinta. She has underwent a stress test in November 2017 that was negative for ischemia showed hyperdynamic LV function and hypertensive response to stress, hydrochlorothiazide was added to her regimen.  CAD: continue ASA/plavix, statin and BB. She has a history of ulcers and will be maintained on the enteric coated aspirin, she continues to have atypical chest pain-not related to exertion, she is advised to watch those are related to food intake possibly GI related.  HTN: BP well controlled on current regimen after adding hydrochlorothiazide.  H/o Syncope: no driving for 6 months after event. A exercise myoview has been arranged by Dr. Meda Coffee. Her stress test was negative she is allowed to drive.   Hyperlipidemia - with significant muscle pain and joint pain on atorvastatin, we'll discontinue check CK-MB, CMP, CBC, TSH and lipids today, start pravastatin 20 mg daily.  Medication Adjustments/Labs and Tests Ordered: Current medicines are reviewed at length with the patient today.  Concerns regarding medicines are outlined above.  Medication changes, Labs and  Tests ordered today are listed in the Patient Instructions below. Patient Instructions  Medication Instructions:  STOP Atorvastatin (Lipitor) START Pravastatin (Pravachol) 20 mg once daily   Labwork: TODAY - cholesterol, complete metabolic panel  Testing/Procedures: None Ordered   Follow-Up: Your physician recommends that you schedule a follow-up appointment in: 2 months with Dr. Meda Coffee.    If you need a refill on your cardiac medications before your next appointment, please call your pharmacy.   Thank you for choosing [facility]! Christen Bame, RN [phone]       Signed, Ena Dawley, MD  10/20/2016 9:21 AM    [address], [place], [place]  [address] Phone: [phone]; Fax: [phone]

## 2016-10-22 ENCOUNTER — Other Ambulatory Visit: Payer: Self-pay | Admitting: *Deleted

## 2016-10-22 ENCOUNTER — Telehealth: Payer: Self-pay | Admitting: *Deleted

## 2016-10-22 ENCOUNTER — Telehealth: Payer: Self-pay | Admitting: Cardiology

## 2016-10-22 DIAGNOSIS — M7918 Myalgia, other site: Secondary | ICD-10-CM

## 2016-10-22 LAB — CREATININE KINASE MB

## 2016-10-22 NOTE — Telephone Encounter (Signed)
New Message ° ° ° °Patient returning phone call  °

## 2016-10-22 NOTE — Telephone Encounter (Signed)
Pt called back to receive lab results.  Informed the pt that per Dr Meda Coffee, all labs normal including CMP, lipids, CBC, TSH.  Pt verbalized understanding.

## 2016-10-22 NOTE — Telephone Encounter (Signed)
[facility] called on this pt to inform Dr Meda Coffee that the CK MB she ordered on the pt on 12/26 was incorrectly obtained by the lab tech and the specimen wasn't frozen, therefore this specimen was discarded and will need to be recollected on the pt.  All other labs Dr Meda Coffee ordered on this pt on 12/26 was collected and has already been resulted.  Per [place], the pt will not be billed for this lab.  Will forward this information to Dr Meda Coffee to review and advise on whether she would like for this to be recollected on the pt or not.  Will follow-up with the pt accordingly, thereafter.

## 2016-10-22 NOTE — Telephone Encounter (Signed)
New order for CK placed in the system and [facility] to add this on to pts lab CKMB that was cancelled out, due to incorrect collection of specimen and ordering.  [facility] verbalized they can run a regular CK lab, for this specimen will not need to be frozen.  Will inform Dr Meda Coffee of delay in result and change of order.

## 2016-10-23 LAB — CK: Total CK: 70 U/L (ref 7–177)

## 2016-11-13 ENCOUNTER — Ambulatory Visit: Payer: Medicare Other | Admitting: Gastroenterology

## 2016-11-17 ENCOUNTER — Emergency Department (HOSPITAL_COMMUNITY)
Admission: EM | Admit: 2016-11-17 | Discharge: 2016-11-17 | Disposition: A | Discharge status: Home / Self Care | Payer: Medicare Other | Attending: Emergency Medicine | Admitting: Emergency Medicine

## 2016-11-17 ENCOUNTER — Encounter (HOSPITAL_COMMUNITY): Payer: Self-pay | Admitting: Emergency Medicine

## 2016-11-17 ENCOUNTER — Emergency Department (HOSPITAL_COMMUNITY): Payer: Medicare Other

## 2016-11-17 ENCOUNTER — Telehealth: Payer: Self-pay | Admitting: Cardiology

## 2016-11-17 DIAGNOSIS — W19XXXA Unspecified fall, initial encounter: Secondary | ICD-10-CM

## 2016-11-17 DIAGNOSIS — S92414A Nondisplaced fracture of proximal phalanx of right great toe, initial encounter for closed fracture: Secondary | ICD-10-CM | POA: Diagnosis not present

## 2016-11-17 DIAGNOSIS — I2510 Atherosclerotic heart disease of native coronary artery without angina pectoris: Secondary | ICD-10-CM | POA: Diagnosis not present

## 2016-11-17 DIAGNOSIS — S99921A Unspecified injury of right foot, initial encounter: Secondary | ICD-10-CM | POA: Diagnosis present

## 2016-11-17 DIAGNOSIS — S0990XA Unspecified injury of head, initial encounter: Secondary | ICD-10-CM | POA: Diagnosis not present

## 2016-11-17 DIAGNOSIS — Z87891 Personal history of nicotine dependence: Secondary | ICD-10-CM | POA: Diagnosis not present

## 2016-11-17 DIAGNOSIS — Y999 Unspecified external cause status: Secondary | ICD-10-CM | POA: Insufficient documentation

## 2016-11-17 DIAGNOSIS — Z79899 Other long term (current) drug therapy: Secondary | ICD-10-CM | POA: Diagnosis not present

## 2016-11-17 DIAGNOSIS — Z7982 Long term (current) use of aspirin: Secondary | ICD-10-CM | POA: Diagnosis not present

## 2016-11-17 DIAGNOSIS — Y92009 Unspecified place in unspecified non-institutional (private) residence as the place of occurrence of the external cause: Secondary | ICD-10-CM | POA: Insufficient documentation

## 2016-11-17 DIAGNOSIS — S0081XA Abrasion of other part of head, initial encounter: Secondary | ICD-10-CM | POA: Diagnosis not present

## 2016-11-17 DIAGNOSIS — Y9301 Activity, walking, marching and hiking: Secondary | ICD-10-CM | POA: Diagnosis not present

## 2016-11-17 DIAGNOSIS — J45909 Unspecified asthma, uncomplicated: Secondary | ICD-10-CM | POA: Diagnosis not present

## 2016-11-17 DIAGNOSIS — S92311A Displaced fracture of first metatarsal bone, right foot, initial encounter for closed fracture: Secondary | ICD-10-CM | POA: Diagnosis not present

## 2016-11-17 DIAGNOSIS — I119 Hypertensive heart disease without heart failure: Secondary | ICD-10-CM | POA: Insufficient documentation

## 2016-11-17 DIAGNOSIS — W0110XA Fall on same level from slipping, tripping and stumbling with subsequent striking against unspecified object, initial encounter: Secondary | ICD-10-CM | POA: Diagnosis not present

## 2016-11-17 LAB — BASIC METABOLIC PANEL
Anion gap: 9 (ref 5–15)
BUN: 16 mg/dL (ref 6–20)
CALCIUM: 9 mg/dL (ref 8.9–10.3)
CHLORIDE: 100 mmol/L — AB (ref 101–111)
CO2: 27 mmol/L (ref 22–32)
CREATININE: 1.07 mg/dL — AB (ref 0.44–1.00)
GFR calc Af Amer: 60 mL/min — ABNORMAL LOW (ref >60)
GFR calc non Af Amer: 51 mL/min — ABNORMAL LOW (ref >60)
GLUCOSE: 125 mg/dL — AB (ref 65–99)
Potassium: 3.6 mmol/L (ref 3.5–5.1)
Sodium: 136 mmol/L (ref 135–145)

## 2016-11-17 LAB — TROPONIN I: Troponin I: <0.03 ng/mL (ref <0.03)

## 2016-11-17 LAB — CBC WITH DIFFERENTIAL/PLATELET
Basophils Absolute: 0.1 10*3/uL (ref 0.0–0.1)
Basophils Relative: 1 %
Eosinophils Absolute: 0.3 10*3/uL (ref 0.0–0.7)
Eosinophils Relative: 4 %
HEMATOCRIT: 40.8 % (ref 36.0–46.0)
HEMOGLOBIN: 13.5 g/dL (ref 12.0–15.0)
LYMPHS ABS: 2.5 10*3/uL (ref 0.7–4.0)
Lymphocytes Relative: 32 %
MCH: 29.2 pg (ref 26.0–34.0)
MCHC: 33.1 g/dL (ref 30.0–36.0)
MCV: 88.3 fL (ref 78.0–100.0)
MONO ABS: 0.7 10*3/uL (ref 0.1–1.0)
MONOS PCT: 9 %
NEUTROS ABS: 4.2 10*3/uL (ref 1.7–7.7)
NEUTROS PCT: 54 %
Platelets: 307 10*3/uL (ref 150–400)
RBC: 4.62 MIL/uL (ref 3.87–5.11)
RDW: 12.6 % (ref 11.5–15.5)
WBC: 7.8 10*3/uL (ref 4.0–10.5)

## 2016-11-17 NOTE — ED Triage Notes (Addendum)
States 2 nights ago she had stomach virus and she was up having diarrhea and she tripped and fell and hit her head, pt is on plavix,  Bleeding controlled at this time pt has bruised to face over left eye and face , unkn if she had loc she states and she hurt her left foot, her  Rt  big toe  Is bruised and swollen, foot has good pulse and blanch and can wiggle toes has good sensation

## 2016-11-17 NOTE — ED Notes (Signed)
Ortho tech paged  

## 2016-11-17 NOTE — Discharge Instructions (Signed)
Please monitor for changes in your cardiac status as discussed. Follow-up with Dr. Meda Coffee, your orthopedist, and your primary care provider.

## 2016-11-17 NOTE — Telephone Encounter (Signed)
Pt did not lose consciousness when she hit her head, states she has had headache, some vision problems but they are not new since hitting her head. Pt states the gash is sticky, doesn't appear to be any active bleeding. Pt states she also thinks she has broken her foot and is going to ED for further evaluation. Pt states she did take Plavix and aspirin this morning, is asking if she should hold/adjust either of these medications. Pt advised ideally she should continue both for a year after stent done 10/17 but holding/adjusting is an option if medically necessary. Pt advised to see medical care at ED now for further evaluation of injury to her head and foot.   Pt advised I will forward to Dr Meda Coffee for review

## 2016-11-17 NOTE — Progress Notes (Signed)
Orthopedic Tech Progress Note Patient Details:  Felicia Perry 12/25/1945 [id number]  Ortho Devices Type of Ortho Device: CAM walker Ortho Device/Splint Interventions: Application   [place] Pink 11/17/2016, 6:40 PM

## 2016-11-17 NOTE — Telephone Encounter (Signed)
Pt states she went to get out of bed yesterday morning, her foot slipped, she lost her balance and hit her head and has a gash /knot on left temple.

## 2016-11-17 NOTE — Telephone Encounter (Signed)
Mrs.Borrayo is calling because she fell out of bed and she hit her head on the corner of the night stand and cant get it to stop bleeding .States its a knot about the size of quarter and there is a gash in the middle of it . Please call

## 2016-11-17 NOTE — ED Notes (Signed)
Ortho tech coming top placed post op boot.

## 2016-11-17 NOTE — ED Provider Notes (Signed)
[facility] DEPT Provider Note   CSN: [id number] Arrival date & time: 11/17/16  1228     History   Chief Complaint Chief Complaint  Patient presents with  . Fall  . Head Injury  . Foot Injury    HPI Felicia Perry is a 71 y.o. female.  Patient with reported fall 2 nights ago. She reports contracting a stomach virus with diarrhea. Became light-headed while walking, does not remember falling. She is unable to say with certainty that she did not lose consciousness. She has an abrasion over her left eye, and bruising to the left parietal scalp with superficial wound. No active bleeding at present. History of CAD with recent stent placement.  She has continued to have intermittent chest pressure since the fall, but is improving.   The history is provided by the patient. No language interpreter was used.  Fall  This is a new problem. The current episode started 2 days ago. The problem has been gradually improving. Pertinent negatives include no abdominal pain. Associated symptoms comments: Chest pressure..  Head Injury   The incident occurred 2 days ago. She came to the ER via walk-in. The injury mechanism was a fall. Length of episode of loss of consciousness: unknown. The volume of blood lost was minimal. The quality of the pain is described as dull.  Foot Injury   The incident occurred 2 days ago. The incident occurred at home. The injury mechanism was a fall. The pain is present in the left foot. The quality of the pain is described as throbbing. The pain has been constant since onset. She reports no foreign bodies present. The symptoms are aggravated by bearing weight.    Past Medical History:  Diagnosis Date  . Adenomatous polyp 11/23/2005  . Anxiety   . Coronary artery disease    a. HC on 08/14/16 showed RCA CTO s/p PCI, 60% LCX disease managed medically with recs to consider staged PCI if continued symptoms.   Marland Kitchen DIVERTICULOSIS, COLON 04/22/2007       . GERD  (gastroesophageal reflux disease)   . History of shingles   . Hypertension   . Hypertensive heart disease   . Internal hemorrhoids   . Osteoarthritis    "knees, hands, lower back" (08/14/2016)  . PUD (peptic ulcer disease)   . Sleep apnea    a. resolved with weight loss    Patient Active Problem List   Diagnosis Date Noted  . DOE (dyspnea on exertion) 09/02/2016  . H/O hyperparathyroidism 08/18/2016  . Coronary artery disease   . PUD (peptic ulcer disease)   . Syncope 08/04/2016  . Enlarged lymph nodes 05/19/2016  . Vitamin D deficiency 08/20/2015  . Hyperglycemia 08/06/2015  . Xerosis of skin 08/06/2015  . Hypersomnia with sleep apnea 03/26/2015  . Chronic pain syndrome 03/26/2015  . Nonallopathic lesion of lumbosacral region 11/13/2014  . Osteoarthritis of left lower extremity 10/17/2014  . Chronic meniscal tear of knee 10/17/2014  . Nonallopathic lesion of thoracic region 09/14/2014  . Nonallopathic lesion-rib cage 08/21/2014  . Tendinopathy of right rotator cuff 07/30/2014  . Piriformis syndrome of right side 07/30/2014  . Insomnia 07/13/2014  . Limited joint range of motion 07/13/2014  . Rash 07/13/2014  . GERD (gastroesophageal reflux disease) 02/25/2011  . CERVICAL DISC DISORDER 10/10/2007  . LOW BACK PAIN 07/01/2007  . Depression 04/22/2007  . Essential hypertension 04/22/2007  . Asthma 04/22/2007    Past Surgical History:  Procedure Laterality Date  . BREAST SURGERY Left  1980s   Fibrous Tumors removed   . CARDIAC CATHETERIZATION N/A 08/14/2016   Procedure: Left Heart Cath and Coronary Angiography;  Surgeon: Sherren Mocha, MD;  Location: [facility];  Service: Cardiovascular;  Laterality: N/A;  . CARDIAC CATHETERIZATION N/A 08/14/2016   Procedure: Coronary Stent Intervention;  Surgeon: Sherren Mocha, MD;  Location: [facility];  Service: Cardiovascular;  Laterality: N/A;  . CATARACT EXTRACTION W/ INTRAOCULAR LENS  IMPLANT, BILATERAL Bilateral  05/2009  . CORONARY ANGIOPLASTY    . TUBAL LIGATION  1970s    OB History    No data available       Home Medications    Prior to Admission medications   Medication Sig Start Date End Date Taking? Authorizing Provider  acetaminophen (TYLENOL) 325 MG tablet Take 650 mg by mouth every 6 (six) hours as needed.    Historical Provider, MD  albuterol (PROVENTIL HFA;VENTOLIN HFA) 108 (90 Base) MCG/ACT inhaler Inhale 2 puffs into the lungs every 6 (six) hours as needed for wheezing or shortness of breath. 08/25/16   Marin Olp, MD  alprazolam Duanne Moron) 2 MG tablet Take 2 mg by mouth at bedtime as needed for sleep. Take 1-1.5 tablets at bedtime    Historical Provider, MD  amLODipine-valsartan (EXFORGE) 10-320 MG tablet TAKE ONE TABLET EACH DAY 10/09/16   Marin Olp, MD  aspirin EC 81 MG tablet Take 1 tablet (81 mg total) by mouth daily. 08/12/16   Dorothy Spark, MD  budesonide-formoterol (SYMBICORT) 160-4.5 MCG/ACT inhaler Inhale 2 puffs into the lungs 2 (two) times daily. 08/25/16   Marin Olp, MD  clopidogrel (PLAVIX) 75 MG tablet Take 4 tablets by mouth on day 1 (starting tonight) then take 1 tablet by mouth daily after that 08/24/16   Dayna N Dunn, PA-C  hydrochlorothiazide (HYDRODIURIL) 25 MG tablet Take 1 tablet (25 mg total) by mouth daily. 09/03/16 12/02/16  Dorothy Spark, MD  levocetirizine (XYZAL) 5 MG tablet TAKE ONE TABLET EVERY MORNING 05/12/16   Marin Olp, MD  pravastatin (PRAVACHOL) 20 MG tablet Take 1 tablet (20 mg total) by mouth every evening. 10/20/16 01/18/17  Dorothy Spark, MD  propranolol (INDERAL) 40 MG tablet TAKE ONE TABLET EACH DAY 10/09/16   Marin Olp, MD  ranitidine (ZANTAC) 150 MG tablet Take 1 tablet (150 mg total) by mouth 2 (two) times daily. 02/04/16   Marin Olp, MD  traMADol (ULTRAM) 50 MG tablet Take 100 mg by mouth every morning.     Historical Provider, MD  venlafaxine XR (EFFEXOR-XR) 37.5 MG 24 hr capsule TAKE 3  CAPSULES DAILY WITH BREAKFAST 10/12/16   [name], DO  Vitamin D, Ergocalciferol, (DRISDOL) 50000 units CAPS capsule TAKE 1 CAPSULE EVERY 7 DAYS 08/27/16   Marin Olp, MD    Family History Family History  Problem Relation Age of Onset  . Heart disease Mother   . Hypertension Mother   . Diabetes Mother   . COPD Father   . Colon cancer Neg Hx     Social History Social History  Substance Use Topics  . Smoking status: Former Smoker    Packs/day: 1.00    Years: 26.00    Types: Cigarettes    Quit date: 1989  . Smokeless tobacco: Never Used  . Alcohol use No     Allergies   Celexa [citalopram hydrobromide]; Cephalosporins; Clarithromycin; Clarithromycin; Doxycycline hyclate; Levofloxacin; Penicillins; and Zolpidem tartrate   Review of Systems Review of Systems  Constitutional: Positive for fatigue.  Gastrointestinal: Negative for abdominal pain.  Musculoskeletal: Positive for arthralgias.  Neurological: Negative for dizziness and light-headedness.  Hematological: Bruises/bleeds easily.       Is on plavix  All other systems reviewed and are negative.    Physical Exam Updated Vital Signs BP 123/70 (BP Location: Right Arm)   Pulse 62   Temp 97.9 F (36.6 C) (Oral)   Resp 18   Ht 5' (1.524 m)   Wt 71.2 kg   SpO2 100%   BMI 30.66 kg/m   Physical Exam  Constitutional: She is oriented to person, place, and time. She appears well-developed and well-nourished.  HENT:  Head: Head is with abrasion and with contusion.    Eyes: EOM are normal.  Neck: Neck supple.  Cardiovascular: Normal rate and regular rhythm.   Pulmonary/Chest: Effort normal and breath sounds normal.  Abdominal: Soft. Bowel sounds are normal.  Musculoskeletal: She exhibits tenderness.       Feet:  Lymphadenopathy:    She has no cervical adenopathy.  Neurological: She is alert and oriented to person, place, and time.  Skin: Skin is warm and dry.  Psychiatric: She has a normal mood  and affect.     ED Treatments / Results  Labs (all labs ordered are listed, but only abnormal results are displayed) Labs Reviewed  BASIC METABOLIC PANEL - Abnormal; Notable for the following:       Result Value   Chloride 100 (*)    Glucose, Bld 125 (*)    Creatinine, Ser 1.07 (*)    GFR calc non Af Amer 51 (*)    GFR calc Af Amer 60 (*)    All other components within normal limits  CBC WITH DIFFERENTIAL/PLATELET  TROPONIN I    EKG  EKG Interpretation  Date/Time:  Tuesday November 17 2016 17:14:55 EST Ventricular Rate:  63 PR Interval:  154 QRS Duration: 88 QT Interval:  434 QTC Calculation: 444 R Axis:   79 Text Interpretation:  Normal sinus rhythm Normal ECG Confirmed by Jeneen Rinks  MD, Joseph ([id number]) on 11/17/2016 5:14:10 PM       Radiology Ct Head Wo Contrast  Result Date: 11/17/2016 CLINICAL DATA:  71 year old female post fall yesterday hitting head on night stand. On blood thinner. Initial encounter. EXAM: CT HEAD WITHOUT CONTRAST TECHNIQUE: Contiguous axial images were obtained from the base of the skull through the vertex without intravenous contrast. COMPARISON:  None. FINDINGS: Brain: No intracranial hemorrhage or CT evidence of large acute infarct. Punctate hyperdensity within sulci suggestive of tiny calcifications rather than hemorrhage. No hydrocephalus. No intracranial mass lesion noted on this unenhanced exam. Vascular: Vascular calcifications. Skull: No skull fracture. Sinuses/Orbits: Post lens replacement without acute orbital abnormality. Visualized sinuses, mastoid air cells and middle ear cavities are clear. Other: Negative IMPRESSION: No acute intracranial abnormality. Electronically Signed   By: Genia Del M.D.   On: 11/17/2016 14:36   Dg Foot Complete Right  Result Date: 11/17/2016 CLINICAL DATA:  Pain following fall EXAM: RIGHT FOOT COMPLETE - 3+ VIEW COMPARISON:  None. FINDINGS: Frontal, oblique, and lateral views were obtained. There is an obliquely  oriented fracture along the proximal aspect of the first proximal phalanx with mild displacement of fracture fragments. There R fractures of the distal first metatarsal medially and laterally with alignment in these area is essentially anatomic. No other fractures are evident. No dislocation. There is moderate osteoarthritic change in the first MTP joint. There is milder narrowing of all PIP  and DIP joints. There are spurs arising from the inferior and posterior calcaneus. IMPRESSION: Nondisplaced fractures distal first metatarsal. Mildly displaced fracture along the medial proximal aspect of the first proximal phalanx. No dislocations. Osteoarthritic change in multiple distal joints. There are calcaneal spurs. Electronically Signed   By: Lowella Grip III M.D.   On: 11/17/2016 13:17    Procedures Procedures (including critical care time)  Medications Ordered in ED Medications - No data to display   Initial Impression / Assessment and Plan / ED Course  I have reviewed the triage vital signs and the nursing notes.  Pertinent labs & imaging results that were available during my care of the patient were reviewed by me and considered in my medical decision making (see chart for details).   Consulted with cardiology [facility]): due to negative troponin, normal ECG without indication of ischemia, patient may be discharged home with outpatient cardiology follow-up.  Patient is to be discharged with recommendation to follow up with PCP in regards to today's hospital visit. Chest pressure is not likely of cardiac or pulmonary etiology d/t presentation, perc negative, VSS, no tracheal deviation, no JVD or new murmur, RRR, breath sounds equal bilaterally, EKG without acute abnormalities, negative troponin. Pt has been advised to return to the ED is CP becomes exertional, associated with diaphoresis or nausea, radiates to left jaw/arm, worsens or becomes concerning in any way. Pt appears reliable for follow  up and is agreeable to discharge.   Case has been discussed with and seen by Dr. Jeneen Rinks who agrees with the above plan to discharge.     Final Clinical Impressions(s) / ED Diagnoses   Final diagnoses:  None    New Prescriptions New Prescriptions   No medications on file     Etta Quill, NP 11/18/16 Page, MD 11/26/16 [phone]

## 2016-11-19 ENCOUNTER — Encounter: Payer: Self-pay | Admitting: Family Medicine

## 2016-11-19 ENCOUNTER — Ambulatory Visit: Payer: Self-pay

## 2016-11-19 ENCOUNTER — Ambulatory Visit (INDEPENDENT_AMBULATORY_CARE_PROVIDER_SITE_OTHER): Payer: Medicare Other | Admitting: Family Medicine

## 2016-11-19 VITALS — BP 122/70 | HR 71 | Ht 61.0 in

## 2016-11-19 DIAGNOSIS — M79671 Pain in right foot: Secondary | ICD-10-CM

## 2016-11-19 DIAGNOSIS — S92314A Nondisplaced fracture of first metatarsal bone, right foot, initial encounter for closed fracture: Secondary | ICD-10-CM | POA: Diagnosis not present

## 2016-11-19 MED ORDER — VENLAFAXINE HCL ER 37.5 MG PO CP24: 37.5000 mg | ORAL_CAPSULE | Freq: Every day | ORAL | 5 refills | Status: DC

## 2016-11-19 NOTE — Patient Instructions (Signed)
Good to see you  We will put you in a better boot.  We will continue the effexor but watch for any side effects.  Once weekly vitamin D still . Wear boot daily until I see you again in 2 weeks.

## 2016-11-19 NOTE — Assessment & Plan Note (Signed)
Done have a metatarsal fracture. First toe. Intra-articular but no displacement. Been in a [place] [facility] short. Discussed with patient about vitamin D supplementation and icing. Discussed that this will likely take some time. Patient continued to be active. Follow-up with me again in 2-3 weeks and at that time if doing well we'll discuss repeat x-rays were potentially transition to postop boot.

## 2016-11-19 NOTE — Progress Notes (Signed)
[name] [address] [place] [address], [place] [address] Phone: [phone] Subjective:     CC: Right foot pain  [id number]  Felicia Perry is a 71 y.o. female coming in with complaint of right foot pain. Patient on January 21 toe. Patient was having more of a stomach virus. Patient states that she did not lose any consciousness but did hit her toe on the floor in weird angle. Had difficulty walking. Was seen in emergency room. They did get x-rays at that time. X-rays were independently visualized by me. Patient does have a nondisplaced fracture of the first metatarsal. Patient states still having pain. Not severe. Can put pressure on it in a boot. Asian states that the swelling is down.     Past Medical History:  Diagnosis Date  . Adenomatous polyp 11/23/2005  . Anxiety   . Coronary artery disease    a. HC on 08/14/16 showed RCA CTO s/p PCI, 60% LCX disease managed medically with recs to consider staged PCI if continued symptoms.   Marland Kitchen DIVERTICULOSIS, COLON 04/22/2007       . GERD (gastroesophageal reflux disease)   . History of shingles   . Hypertension   . Hypertensive heart disease   . Internal hemorrhoids   . Osteoarthritis    "knees, hands, lower back" (08/14/2016)  . PUD (peptic ulcer disease)   . Sleep apnea    a. resolved with weight loss   Past Surgical History:  Procedure Laterality Date  . BREAST SURGERY Left 1980s   Fibrous Tumors removed   . CARDIAC CATHETERIZATION N/A 08/14/2016   Procedure: Left Heart Cath and Coronary Angiography;  Surgeon: Sherren Mocha, MD;  Location: [facility];  Service: Cardiovascular;  Laterality: N/A;  . CARDIAC CATHETERIZATION N/A 08/14/2016   Procedure: Coronary Stent Intervention;  Surgeon: Sherren Mocha, MD;  Location: [facility];  Service: Cardiovascular;  Laterality: N/A;  . CATARACT EXTRACTION W/ INTRAOCULAR LENS  IMPLANT, BILATERAL Bilateral 05/2009  . CORONARY ANGIOPLASTY    .  TUBAL LIGATION  1970s   Social History   Social History  . Marital status: Divorced    Spouse name: N/A  . Number of children: N/A  . Years of education: N/A   Occupational History  . Cleaning    Social History Main Topics  . Smoking status: Former Smoker    Packs/day: 1.00    Years: 26.00    Types: Cigarettes    Quit date: 1989  . Smokeless tobacco: Never Used  . Alcohol use No  . Drug use: No  . Sexual activity: Not Currently   Other Topics Concern  . None   Social History Narrative   Divorced for 38 years in 2016. 2 sons.  2 granddaughters.       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       Hobbies: watch tv-survivor, dancing with the stars, enjoy alone time. Best friend [name] patient of Dr. Yong Channel. Enjoys work, time on Teaching laboratory technician.    Allergies  Allergen Reactions  . Celexa [Citalopram Hydrobromide]     psychosis  . Cephalosporins     REACTION: tongue swelling  . Clarithromycin     REACTION: blisters in mouth  . Clarithromycin   . Doxycycline Hyclate     REACTION: nausea, vomiting  . Levofloxacin     REACTION: tongue swells  . Penicillins     REACTION: per patient causes rash,hives  .  Zolpidem Tartrate     REACTION: difficulty with concentration   Family History  Problem Relation Age of Onset  . Heart disease Mother   . Hypertension Mother   . Diabetes Mother   . COPD Father   . Colon cancer Neg Hx     Past medical history, social, surgical and family history all reviewed in electronic medical record.  No pertanent information unless stated regarding to the chief complaint.   Review of Systems:Review of systems updated and as accurate as of 11/19/16  No headache, visual changes, nausea, vomiting, diarrhea, constipation, dizziness, abdominal pain, skin rash, fevers, chills, night sweats, weight loss, swollen lymph nodes, chest pain, shortness of breath, mood changes.  Mild increase in muscle aches and  pains.  Objective  Blood pressure 122/70, pulse 71, height 5\' 1"  (1.549 m), SpO2 96 %. Systems examined below as of 11/19/16   General: No apparent distress alert and oriented x3 mood and affect normal, dressed appropriately.  HEENT: Pupils equal, extraocular movements intact  Respiratory: Patient's speak in full sentences and does not appear short of breath  Cardiovascular: No lower extremity edema, non tender, no erythema  Skin: Warm dry intact with no signs of infection or rash on extremities or on axial skeleton.  Abdomen: Soft nontender  Neuro: Cranial nerves II through XII are intact, neurovascularly intact in all extremities with 2+ DTRs and 2+ pulses.  Lymph: No lymphadenopathy of posterior or anterior cervical chain or axillae bilaterally.  Gait antalgic MSK:  Non tender with full range of motion and good stability and symmetric strength and tone of shoulders, elbows, wrist, hip, knee and ankles bilaterally. Arthritic changes of multiple joints  Right foot exam shows the patient does have swelling of the first metatarsal. Tender to palpation mostly over the medial aspect of the toe. Seems to be more over the IP joint as well as some metatarsal phalanx.  Limited muscular skeletal ultrasound performed and interpreted by [name]  Limited ultrasound patient's first toe shows the patient does have a small avulsion noted on the most distal medial aspect of the metatarsal. Nondisplaced. Hypoechoic changes and increasing Doppler flow noted. Mild soft tissue swelling surrounding the area. Does expand into the joint but mild.  Impression: First metatarsal fracture.    Impression and Recommendations:     This case required medical decision making of moderate complexity.      Note: This dictation was prepared with Dragon dictation along with smaller phrase technology. Any transcriptional errors that result from this process are unintentional.

## 2016-11-20 ENCOUNTER — Other Ambulatory Visit: Payer: Self-pay | Admitting: *Deleted

## 2016-11-20 MED ORDER — VENLAFAXINE HCL ER 37.5 MG PO CP24: 112.5000 mg | ORAL_CAPSULE | Freq: Every day | ORAL | 0 refills | Status: DC

## 2016-11-20 NOTE — Telephone Encounter (Signed)
Refill done.  

## 2016-11-23 NOTE — Telephone Encounter (Signed)
Please tell her I am sorry about the accident. Yes she should continue both ASA and Plavix, if she needs a surgery we will discuss then

## 2016-11-23 NOTE — Telephone Encounter (Signed)
Pt advised to continue both Plavix and aspirin, call if surgery is indicated in the future.

## 2016-11-23 NOTE — Telephone Encounter (Signed)
LMTCB

## 2016-12-06 NOTE — Progress Notes (Signed)
[name] [address] [place], [place] [address] Phone: [phone] Subjective:     CC: Right foot pain  [id number]  Felicia Perry is a 71 y.o. female coming in with complaint of right foot pain. Patient didn't initially and fall. Patient did have x-rays that showed a nondisplaced fracture of the first metatarsal. Patient was put in a postop shoe. Patient was to take vitamin D. Patient states doing much better at this time. Not having any pain. Is able to move her toe left. Patient has been wearing the boot fairly regularly but except in the house where she is not wearing anything at all.     Past Medical History:  Diagnosis Date  . Adenomatous polyp 11/23/2005  . Anxiety   . Coronary artery disease    a. HC on 08/14/16 showed RCA CTO s/p PCI, 60% LCX disease managed medically with recs to consider staged PCI if continued symptoms.   Marland Kitchen DIVERTICULOSIS, COLON 04/22/2007       . GERD (gastroesophageal reflux disease)   . History of shingles   . Hypertension   . Hypertensive heart disease   . Internal hemorrhoids   . Osteoarthritis    "knees, hands, lower back" (08/14/2016)  . PUD (peptic ulcer disease)   . Sleep apnea    a. resolved with weight loss   Past Surgical History:  Procedure Laterality Date  . BREAST SURGERY Left 1980s   Fibrous Tumors removed   . CARDIAC CATHETERIZATION N/A 08/14/2016   Procedure: Left Heart Cath and Coronary Angiography;  Surgeon: Sherren Mocha, MD;  Location: [facility];  Service: Cardiovascular;  Laterality: N/A;  . CARDIAC CATHETERIZATION N/A 08/14/2016   Procedure: Coronary Stent Intervention;  Surgeon: Sherren Mocha, MD;  Location: [facility];  Service: Cardiovascular;  Laterality: N/A;  . CATARACT EXTRACTION W/ INTRAOCULAR LENS  IMPLANT, BILATERAL Bilateral 05/2009  . CORONARY ANGIOPLASTY    . TUBAL LIGATION  1970s   Social History   Social History  . Marital status: Divorced   Spouse name: N/A  . Number of children: N/A  . Years of education: N/A   Occupational History  . Cleaning    Social History Main Topics  . Smoking status: Former Smoker    Packs/day: 1.00    Years: 26.00    Types: Cigarettes    Quit date: 1989  . Smokeless tobacco: Never Used  . Alcohol use No  . Drug use: No  . Sexual activity: Not Currently   Other Topics Concern  . None   Social History Narrative   Divorced for 38 years in 2016. 2 sons.  2 granddaughters.       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       Hobbies: watch tv-survivor, dancing with the stars, enjoy alone time. Best friend [name] patient of Dr. Yong Channel. Enjoys work, time on Teaching laboratory technician.    Allergies  Allergen Reactions  . Celexa [Citalopram Hydrobromide]     psychosis  . Cephalosporins     REACTION: tongue swelling  . Clarithromycin     REACTION: blisters in mouth  . Clarithromycin   . Doxycycline Hyclate     REACTION: nausea, vomiting  . Levofloxacin     REACTION: tongue swells  . Penicillins     REACTION: per patient causes rash,hives  . Zolpidem Tartrate     REACTION: difficulty with concentration   Family History  Problem Relation Age of Onset  . Heart disease Mother   . Hypertension Mother   . Diabetes Mother   . COPD Father   . Colon cancer Neg Hx     Past medical history, social, surgical and family history all reviewed in electronic medical record.  No pertanent information unless stated regarding to the chief complaint.   Review of Systems: No headache, visual changes, nausea, vomiting, diarrhea, constipation, dizziness, abdominal pain, skin rash, fevers, chills, night sweats, weight loss, swollen lymph nodes, chest pain, shortness of breath, mood changes.  Mild increase in muscle aches and pains.  Objective  Blood pressure 104/72, pulse 63, height 5\' 1"  (1.549 m), SpO2 98 %. Systems examined below as of 12/07/16   General: No apparent  distress alert and oriented x3 mood and affect normal, dressed appropriately.  HEENT: Pupils equal, extraocular movements intact  Respiratory: Patient's speak in full sentences and does not appear short of breath  Cardiovascular: No lower extremity edema, non tender, no erythema  Skin: Warm dry intact with no signs of infection or rash on extremities or on axial skeleton.  Abdomen: Soft nontender  Neuro: Cranial nerves II through XII are intact, neurovascularly intact in all extremities with 2+ DTRs and 2+ pulses.  Lymph: Mild lymph node enlargement of the posterior chain of the neck. Possibly smaller than previously. Gait antalgic MSK:  Non tender with full range of motion and good stability and symmetric strength and tone of shoulders, elbows, wrist, hip, knee and ankles bilaterally. Arthritic changes of multiple joints  Right foot exam shows the patient does not have any swelling around the first metatarsal. Patient's previous injury to the skin seems to be healing well. Nontender on exam. Still has some mild hallux limitus noted though..  Limited muscular skeletal ultrasound performed and interpreted by [name]  Limited ultrasound patient's first toe shows patient does have some reabsorption of the bone as well as a good callus formation occurring. No capsulitis noted. Impression: First metatarsal fracture with interval healing.    Impression and Recommendations:     This case required medical decision making of moderate complexity.      Note: This dictation was prepared with Dragon dictation along with smaller phrase technology. Any transcriptional errors that result from this process are unintentional.

## 2016-12-07 ENCOUNTER — Encounter: Payer: Self-pay | Admitting: Family Medicine

## 2016-12-07 ENCOUNTER — Ambulatory Visit: Payer: Self-pay

## 2016-12-07 ENCOUNTER — Ambulatory Visit (INDEPENDENT_AMBULATORY_CARE_PROVIDER_SITE_OTHER): Payer: Medicare Other | Admitting: Family Medicine

## 2016-12-07 VITALS — BP 104/72 | HR 63 | Ht 61.0 in

## 2016-12-07 DIAGNOSIS — S92314A Nondisplaced fracture of first metatarsal bone, right foot, initial encounter for closed fracture: Secondary | ICD-10-CM | POA: Diagnosis not present

## 2016-12-07 DIAGNOSIS — E559 Vitamin D deficiency, unspecified: Secondary | ICD-10-CM | POA: Diagnosis not present

## 2016-12-07 MED ORDER — VENLAFAXINE HCL ER 37.5 MG PO CP24: 75.0000 mg | ORAL_CAPSULE | Freq: Every day | ORAL | 1 refills | Status: DC

## 2016-12-07 NOTE — Patient Instructions (Signed)
Good to see you  Effexor 75mg  daily  Wear the boot daily for one more week.  Wear a shoe in the house as well.  Continue the vitamin D  See me again in 3-4 weeks for the back.

## 2016-12-07 NOTE — Assessment & Plan Note (Signed)
Encourage vitamin D once weekly.

## 2016-12-07 NOTE — Assessment & Plan Note (Signed)
Patient is healing well. We'll continue the boot for another week. We will continue to monitor. Follow-up again in 3-4 weeks to make sure completely resolved.

## 2016-12-09 ENCOUNTER — Telehealth: Payer: Self-pay | Admitting: *Deleted

## 2016-12-09 MED ORDER — VENLAFAXINE HCL ER 75 MG PO CP24: 75.0000 mg | ORAL_CAPSULE | Freq: Every day | ORAL | 1 refills | Status: DC

## 2016-12-09 NOTE — Telephone Encounter (Signed)
Rec'd script for Venlafaxine Xr 37.5 mg cap to take 2 at breakfast. Due to insurance plan requesting to change to a single 75 mg capsule. Is this ok....Johny Chess

## 2016-12-09 NOTE — Telephone Encounter (Signed)
I already made the change.

## 2016-12-14 ENCOUNTER — Encounter (HOSPITAL_COMMUNITY): Payer: Self-pay | Admitting: Family Medicine

## 2016-12-14 NOTE — Progress Notes (Signed)
Mailed patient letter with information about Cardiac Rehab information. MW

## 2016-12-21 ENCOUNTER — Telehealth: Payer: Self-pay | Admitting: Cardiology

## 2016-12-21 ENCOUNTER — Ambulatory Visit (INDEPENDENT_AMBULATORY_CARE_PROVIDER_SITE_OTHER): Payer: Medicare Other | Admitting: Cardiology

## 2016-12-21 VITALS — BP 122/72 | HR 76 | Ht 61.0 in | Wt 152.0 lb

## 2016-12-21 DIAGNOSIS — I119 Hypertensive heart disease without heart failure: Secondary | ICD-10-CM | POA: Diagnosis not present

## 2016-12-21 DIAGNOSIS — I10 Essential (primary) hypertension: Secondary | ICD-10-CM

## 2016-12-21 DIAGNOSIS — I2510 Atherosclerotic heart disease of native coronary artery without angina pectoris: Secondary | ICD-10-CM | POA: Diagnosis not present

## 2016-12-21 DIAGNOSIS — I309 Acute pericarditis, unspecified: Secondary | ICD-10-CM

## 2016-12-21 DIAGNOSIS — Z87898 Personal history of other specified conditions: Secondary | ICD-10-CM

## 2016-12-21 DIAGNOSIS — Z789 Other specified health status: Secondary | ICD-10-CM | POA: Diagnosis not present

## 2016-12-21 MED ORDER — COLCHICINE 0.6 MG PO TABS: 0.6000 mg | ORAL_TABLET | Freq: Two times a day (BID) | ORAL | 1 refills | Status: DC

## 2016-12-21 NOTE — Telephone Encounter (Signed)
Informed the Pharmacist at [facility] that per Dr Meda Coffee, the pts colchicine is short term, and this is ok to fill while she is taking pravastatin.  Pharmacist verbalized understanding and agrees with this plan.

## 2016-12-21 NOTE — Patient Instructions (Signed)
Medication Instructions:   START TAKING COLCRY'S (COLCHICINE) 0.6 MG TWICE DAILY WITH FOOD     Follow-Up:  2 MONTHS WITH DR Meda Coffee       If you need a refill on your cardiac medications before your next appointment, please call your pharmacy.

## 2016-12-21 NOTE — Progress Notes (Signed)
Cardiology Office Note    Date:  12/21/2016   ID:  Felicia, Perry July 24, 1946, MRN [id number]  PCP:  Felicia Reddish, MD  Cardiologist:  Dr. Meda Perry  CC: 1 week follow up after switching Brilinta to plavix  History of Present Illness:  Felicia Perry is a 71 y.o. female with a history of recently diagnosed CAD (RCA CTO s/p complex PCI, 60% LCx disease, normal LVEF 08/14/16), HTN, PUD, asthma, anxiety who presents for follow up.  Recently admitted for abnormal stress test (done for recurrent syncope) and unstable angina, with PCI as above - her Cx disease was treated medically with recommendation to consider staged PCI if she had continued symptoms, LVEDP 15. DAPT for at least 1 year recommended. She was discharged with rx for Brilinta and high dose statin. 2D ceho 08/07/16: EF 55-60%, mild LVH, grade 1 DD, calcified MV annulus.  She lives at home with her addicted (heroin) son. This causes a lot of anxiety.   She was seen by Felicia Copa PA-C in the office for post hospital follow up on 08/24/16. She reported significant exertional dyspnea ever since her PCI. This was felt possibly related to Brilinta and she was switched to plavix 75mg  daily with a 300mg  daily load (no effient with possible hx of TIA/CVA).  She was told not to drive given hx of syncope.  She saw Dr. Yong Perry, PCP,  on 08/25/16. No changes were made.   08/31/2016 - She can breath normally now and feeling so relieved about this. Within 24 hours of switching to plavix she was feeling completely better. She has been able to cook and do some house cleaning. She walks the dogs. 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 No chest pain or SOB. No LE edema, orthopnea or PND. Her appetite is back. No dizziness or syncope. She has had some heaviness her legs that she is related to the statin. No smoking.   10/20/2016 - the patient is coming after 6 weeks, she continues to have atypical upper midsternal right-sided  pressure-like pain, these are not related to exertion, she continues to work 8-10 hour days now feels more tired than before. Her shortness of breath with exertion has significantly improved. She just overall feels very anxious and depressed about the situation. She's been compliant with her meds but has not the significant muscle and joint pain since starting Lipitor. No palpitations or syncope. No lower extremity edema but significant calf pain.  12/21/16 - 2 months follow up, in the interim she broke her foot and fell - head injury- normal CT head, no intracranial bleed. She has been experiencing chest pain when she bends down or stands upright, 2-3 minutes, squeezing, retrosternal, no radiation no SOB. Denies LE edema, no orthopnea, PND. She has significant stress in life - sons girlfriend overdosed, granddaughter moved out and is missing, she is worrying about loosing her business.    Past Medical History:  Diagnosis Date  . Adenomatous polyp 11/23/2005  . Anxiety   . Coronary artery disease    a. HC on 08/14/16 showed RCA CTO s/p PCI, 60% LCX disease managed medically with recs to consider staged PCI if continued symptoms.   Marland Kitchen DIVERTICULOSIS, COLON 04/22/2007       . GERD (gastroesophageal reflux disease)   . History of shingles   . Hypertension   . Hypertensive heart disease   . Internal hemorrhoids   . Osteoarthritis    "knees, hands, lower  back" (08/14/2016)  . PUD (peptic ulcer disease)   . Sleep apnea    a. resolved with weight loss    Past Surgical History:  Procedure Laterality Date  . BREAST SURGERY Left 1980s   Fibrous Tumors removed   . CARDIAC CATHETERIZATION N/A 08/14/2016   Procedure: Left Heart Cath and Coronary Angiography;  Surgeon: Sherren Mocha, MD;  Location: [facility];  Service: Cardiovascular;  Laterality: N/A;  . CARDIAC CATHETERIZATION N/A 08/14/2016   Procedure: Coronary Stent Intervention;  Surgeon: Sherren Mocha, MD;  Location: [facility];  Service: Cardiovascular;  Laterality: N/A;  . CATARACT EXTRACTION W/ INTRAOCULAR LENS  IMPLANT, BILATERAL Bilateral 05/2009  . CORONARY ANGIOPLASTY    . TUBAL LIGATION  1970s    Current Medications: Outpatient Medications Prior to Visit  Medication Sig Dispense Refill  . acetaminophen (TYLENOL) 325 MG tablet Take 650 mg by mouth every 6 (six) hours as needed.    Marland Kitchen albuterol (PROVENTIL HFA;VENTOLIN HFA) 108 (90 Base) MCG/ACT inhaler Inhale 2 puffs into the lungs every 6 (six) hours as needed for wheezing or shortness of breath. 1 Inhaler 5  . alprazolam (XANAX) 2 MG tablet Take 2 mg by mouth at bedtime as needed for sleep. Take 1-1.5 tablets at bedtime    . amLODipine-valsartan (EXFORGE) 10-320 MG tablet TAKE ONE TABLET EACH DAY 30 tablet 5  . aspirin EC 81 MG tablet Take 1 tablet (81 mg total) by mouth daily. 90 tablet 3  . budesonide-formoterol (SYMBICORT) 160-4.5 MCG/ACT inhaler Inhale 2 puffs into the lungs 2 (two) times daily. 1 Inhaler 3  . clopidogrel (PLAVIX) 75 MG tablet Take 4 tablets by mouth on day 1 (starting tonight) then take 1 tablet by mouth daily after that 94 tablet 3  . levocetirizine (XYZAL) 5 MG tablet TAKE ONE TABLET EVERY MORNING 30 tablet 11  . pravastatin (PRAVACHOL) 20 MG tablet Take 1 tablet (20 mg total) by mouth every evening. 30 tablet 11  . propranolol (INDERAL) 40 MG tablet TAKE ONE TABLET EACH DAY 30 tablet 5  . ranitidine (ZANTAC) 150 MG tablet Take 1 tablet (150 mg total) by mouth 2 (two) times daily. 60 tablet 11  . traMADol (ULTRAM) 50 MG tablet Take 100 mg by mouth every morning.     . venlafaxine XR (EFFEXOR-XR) 75 MG 24 hr capsule Take 1 capsule (75 mg total) by mouth daily with breakfast. 90 capsule 1  . Vitamin D, Ergocalciferol, (DRISDOL) 50000 units CAPS capsule TAKE 1 CAPSULE EVERY 7 DAYS 10 capsule 1  . hydrochlorothiazide (HYDRODIURIL) 25 MG tablet Take 1 tablet (25 mg total) by mouth daily. 90 tablet 3   No facility-administered  medications prior to visit.      Allergies:   Celexa [citalopram hydrobromide]; Cephalosporins; Clarithromycin; Clarithromycin; Doxycycline hyclate; Levofloxacin; Penicillins; and Zolpidem tartrate   Social History   Social History  . Marital status: Divorced    Spouse name: N/A  . Number of children: N/A  . Years of education: N/A   Occupational History  . Cleaning    Social History Main Topics  . Smoking status: Former Smoker    Packs/day: 1.00    Years: 26.00    Types: Cigarettes    Quit date: 1989  . Smokeless tobacco: Never Used  . Alcohol use No  . Drug use: No  . Sexual activity: Not Currently   Other Topics Concern  . Not on file   Social History Narrative   Divorced for 38 years  in 2016. 2 sons.  2 granddaughters.       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       Hobbies: watch tv-survivor, dancing with the stars, enjoy alone time. Best friend [name] patient of Dr. Yong Perry. Enjoys work, time on Teaching laboratory technician.      Family History:  The patient's family history includes COPD in her father; Diabetes in her mother; Heart disease in her mother; Hypertension in her mother.     ROS:   Please see the history of present illness.    ROS All other systems reviewed and are negative.   PHYSICAL EXAM:   VS:  BP 122/72   Pulse 76   Ht 5\' 1"  (1.549 m)   Wt 152 lb (68.9 kg)   BMI 28.72 kg/m    GEN: Well nourished, well developed, in no acute distress  HEENT: normal  Neck: no JVD, carotid bruits, or masses Cardiac: RRR; no murmurs, rubs, or gallops,no edema  Respiratory:  clear to auscultation bilaterally, normal work of breathing GI: soft, nontender, nondistended, + BS MS: no deformity or atrophy  Skin: warm and dry, no rash Neuro:  Alert and Oriented x 3, Strength and sensation are intact Psych: euthymic mood, full affect  Wt Readings from Last 3 Encounters:  12/21/16 152 lb (68.9 kg)  11/17/16 157 lb (71.2 kg)    10/20/16 157 lb (71.2 kg)    Studies/Labs Reviewed:   EKG:  EKG is NOT ordered today.    Recent Labs: 10/20/2016: ALT 13; TSH 1.37 11/17/2016: BUN 16; Creatinine, Ser 1.07; Hemoglobin 13.5; Platelets 307; Potassium 3.6; Sodium 136   Lipid Panel    Component Value Date/Time   CHOL 98 10/20/2016 0934   TRIG 117 10/20/2016 0934   HDL 34 (L) 10/20/2016 0934   CHOLHDL 2.9 10/20/2016 0934   VLDL 23 10/20/2016 0934   LDLCALC 41 10/20/2016 0934   LDLDIRECT 91.9 02/28/2007 0947    Additional studies/ records that were reviewed today include:   Procedures 08/14/16 Coronary Stent Intervention  Left Heart Cath and Coronary Angiography  Conclusion  1. Severe stenosis with chronic total occlusion of the mid right coronary artery, treated successfully with complex stenting as detailed 2. Moderately severe stenosis of the mid left circumflex involving a large obtuse marginal branch 3. Patency of the LAD with mild nonobstructive disease 4. Normal LV function by noninvasive assessment  Recommendations: Dual antiplatelet therapy with aspirin and brilinta for at least one year. Consider staged PCI of the left circumflex bifurcation if recurrent anginal symptoms arise.    2D ECHO 08/07/16: EF 55-60%, mild LVH, grade 1 DD, calcified MV annulus.    ASSESSMENT & PLAN:   Chest pain - typical for an acute pericarditis - positional, non-exertional, start colchicine 0.6 mg po BID.   Dyspnea: resolved off the Brilinta. She has underwent a stress test in November 2017 that was negative for ischemia showed hyperdynamic LV function and hypertensive response to stress, hydrochlorothiazide was added to her regimen.  CAD: continue ASA/plavix, statin and BB. She has a history of ulcers and will be maintained on the enteric coated aspirin, great lipids in 09/2016.  HTN: BP well controlled on current regimen after adding hydrochlorothiazide.  H/o Syncope: no driving for 6 months after event. A  exercise myoview has been arranged by Dr. Meda Perry. Her stress test was negative she is allowed to drive.   Hyperlipidemia - with significant muscle pain and joint pain  on atorvastatin, tolerating pravastatin 20 mg daily. Lipids all at goal in 09/2016.  Medication Adjustments/Labs and Tests Ordered: Current medicines are reviewed at length with the patient today.  Concerns regarding medicines are outlined above.  Medication changes, Labs and Tests ordered today are listed in the Patient Instructions below. There are no Patient Instructions on file for this visit.   Signed, Felicia Dawley, MD  12/21/2016 8:23 AM    [address], [place], [place]  [address] Phone: [phone]; Fax: [phone]

## 2016-12-21 NOTE — Telephone Encounter (Signed)
New Message     The medication for  colcrys 0.6mg  , has a drug interaction for the pravastain , this is a significant interaction

## 2016-12-22 DIAGNOSIS — M19042 Primary osteoarthritis, left hand: Secondary | ICD-10-CM | POA: Diagnosis not present

## 2016-12-22 DIAGNOSIS — M19041 Primary osteoarthritis, right hand: Secondary | ICD-10-CM | POA: Diagnosis not present

## 2016-12-22 DIAGNOSIS — M25561 Pain in right knee: Secondary | ICD-10-CM | POA: Diagnosis not present

## 2016-12-22 DIAGNOSIS — M25562 Pain in left knee: Secondary | ICD-10-CM | POA: Diagnosis not present

## 2017-01-05 ENCOUNTER — Ambulatory Visit: Payer: Medicare Other | Admitting: Family Medicine

## 2017-01-05 ENCOUNTER — Other Ambulatory Visit: Payer: Self-pay | Admitting: Family Medicine

## 2017-01-06 NOTE — Telephone Encounter (Signed)
Yes thanks, may fill. Reviewed chart and has been stable on 1/2 to 1 tablet.

## 2017-01-21 ENCOUNTER — Encounter: Payer: Self-pay | Admitting: Family Medicine

## 2017-01-21 ENCOUNTER — Ambulatory Visit (INDEPENDENT_AMBULATORY_CARE_PROVIDER_SITE_OTHER): Payer: Medicare Other | Admitting: Family Medicine

## 2017-01-21 VITALS — BP 140/82 | HR 78 | Temp 97.5°F | Wt 152.4 lb

## 2017-01-21 DIAGNOSIS — T148XXA Other injury of unspecified body region, initial encounter: Secondary | ICD-10-CM | POA: Diagnosis not present

## 2017-01-21 NOTE — Progress Notes (Signed)
Pre visit review using our clinic review tool, if applicable. No additional management support is needed unless otherwise documented below in the visit note. 

## 2017-01-21 NOTE — Progress Notes (Signed)
Subjective:    Patient ID: Felicia Perry, female    DOB: 1945-12-28, 71 y.o.   MRN: [id number]  HPI  Felicia Perry is a 70 year old female who presents today with ecchymosis and swelling on her left lower calf.  She reports noticing this 2 days ago as a bruise and reports that this has worsened over the past 2 days.  She denies erythema, pain, fever, chills, sweats.   Treatment at home with neosporin as she thought it was a bug bite. No benefit from this medication.  She is taking Plavix with history of CAD (RCA CTO s/p complex PCI, 60^ LCx disease, normal 08/14/16), HTN, PUD, asthma, and anxiety. She states that she has bruising often as she has a cleaning business and "bumps into things" where she will notice bruising since initiation of Plavix She denies hemoptysis, melena, hematochezia, or hematuria. No history of immobilization, recent trauma/surgery, hormone replacement, or stroke  Review of Systems  Constitutional: Negative for chills, fatigue and fever.  Respiratory: Negative for cough, shortness of breath and wheezing.   Cardiovascular: Negative for chest pain and palpitations.  Gastrointestinal: Negative for abdominal pain, constipation, diarrhea, nausea and vomiting.  Musculoskeletal:       Right calf ecchymosis and swelling  Neurological: Negative for dizziness, light-headedness and headaches.   Past Medical History:  Diagnosis Date  . Adenomatous polyp 11/23/2005  . Anxiety   . Coronary artery disease    a. HC on 08/14/16 showed RCA CTO s/p PCI, 60% LCX disease managed medically with recs to consider staged PCI if continued symptoms.   Marland Kitchen DIVERTICULOSIS, COLON 04/22/2007       . GERD (gastroesophageal reflux disease)   . History of shingles   . Hypertension   . Hypertensive heart disease   . Internal hemorrhoids   . Osteoarthritis    "knees, hands, lower back" (08/14/2016)  . PUD (peptic ulcer disease)   . Sleep apnea    a. resolved with weight loss     Social  History   Social History  . Marital status: Divorced    Spouse name: N/A  . Number of children: N/A  . Years of education: N/A   Occupational History  . Cleaning    Social History Main Topics  . Smoking status: Former Smoker    Packs/day: 1.00    Years: 26.00    Types: Cigarettes    Quit date: 1989  . Smokeless tobacco: Never Used  . Alcohol use No  . Drug use: No  . Sexual activity: Not Currently   Other Topics Concern  . Not on file   Social History Narrative   Divorced for 38 years in 2016. 2 sons.  2 granddaughters.       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       Hobbies: watch tv-survivor, dancing with the stars, enjoy alone time. Best friend Felicia Perry patient of Dr. Yong Channel. Enjoys work, time on Teaching laboratory technician.     Past Surgical History:  Procedure Laterality Date  . BREAST SURGERY Left 1980s   Fibrous Tumors removed   . CARDIAC CATHETERIZATION N/A 08/14/2016   Procedure: Left Heart Cath and Coronary Angiography;  Surgeon: Sherren Mocha, MD;  Location: [facility];  Service: Cardiovascular;  Laterality: N/A;  . CARDIAC CATHETERIZATION N/A 08/14/2016   Procedure: Coronary Stent Intervention;  Surgeon: Sherren Mocha, MD;  Location: [facility];  Service: Cardiovascular;  Laterality: N/A;  .  CATARACT EXTRACTION W/ INTRAOCULAR LENS  IMPLANT, BILATERAL Bilateral 05/2009  . CORONARY ANGIOPLASTY    . TUBAL LIGATION  1970s    Family History  Problem Relation Age of Onset  . Heart disease Mother   . Hypertension Mother   . Diabetes Mother   . COPD Father   . Colon cancer Neg Hx     Allergies  Allergen Reactions  . Celexa [Citalopram Hydrobromide]     psychosis  . Cephalosporins     REACTION: tongue swelling  . Clarithromycin     REACTION: blisters in mouth  . Clarithromycin   . Doxycycline Hyclate     REACTION: nausea, vomiting  . Levofloxacin     REACTION: tongue swells  . Penicillins      REACTION: per patient causes rash,hives  . Zolpidem Tartrate     REACTION: difficulty with concentration    Current Outpatient Prescriptions on File Prior to Visit  Medication Sig Dispense Refill  . acetaminophen (TYLENOL) 325 MG tablet Take 650 mg by mouth every 6 (six) hours as needed.    Marland Kitchen albuterol (PROVENTIL HFA;VENTOLIN HFA) 108 (90 Base) MCG/ACT inhaler Inhale 2 puffs into the lungs every 6 (six) hours as needed for wheezing or shortness of breath. 1 Inhaler 5  . alprazolam (XANAX) 2 MG tablet TAKE 1/2 TO 1 TABLET AT BEDTIME AS NEEDED FOR SLEEP 30 tablet 2  . amLODipine-valsartan (EXFORGE) 10-320 MG tablet TAKE ONE TABLET EACH DAY 30 tablet 5  . aspirin EC 81 MG tablet Take 1 tablet (81 mg total) by mouth daily. 90 tablet 3  . budesonide-formoterol (SYMBICORT) 160-4.5 MCG/ACT inhaler Inhale 2 puffs into the lungs 2 (two) times daily. 1 Inhaler 3  . clopidogrel (PLAVIX) 75 MG tablet Take 4 tablets by mouth on day 1 (starting tonight) then take 1 tablet by mouth daily after that 94 tablet 3  . colchicine (COLCRYS) 0.6 MG tablet Take 1 tablet (0.6 mg total) by mouth 2 (two) times daily. 120 tablet 1  . levocetirizine (XYZAL) 5 MG tablet TAKE ONE TABLET EVERY MORNING 30 tablet 11  . propranolol (INDERAL) 40 MG tablet TAKE ONE TABLET EACH DAY 30 tablet 5  . ranitidine (ZANTAC) 150 MG tablet Take 1 tablet (150 mg total) by mouth 2 (two) times daily. 60 tablet 11  . traMADol (ULTRAM) 50 MG tablet Take 100 mg by mouth every morning.     . venlafaxine XR (EFFEXOR-XR) 75 MG 24 hr capsule Take 1 capsule (75 mg total) by mouth daily with breakfast. 90 capsule 1  . Vitamin D, Ergocalciferol, (DRISDOL) 50000 units CAPS capsule TAKE 1 CAPSULE EVERY 7 DAYS 10 capsule 1  . hydrochlorothiazide (HYDRODIURIL) 25 MG tablet Take 1 tablet (25 mg total) by mouth daily. 90 tablet 3  . pravastatin (PRAVACHOL) 20 MG tablet Take 1 tablet (20 mg total) by mouth every evening. 30 tablet 11   No current  facility-administered medications on file prior to visit.     BP 140/82 (BP Location: Left Arm, Patient Position: Sitting, Cuff Size: Normal)   Pulse 78   Temp 97.5 F (36.4 C) (Oral)   Wt 152 lb 6.4 oz (69.1 kg)   SpO2 98%   BMI 28.80 kg/m        Objective:   Physical Exam  Constitutional: She is oriented to person, place, and time. She appears well-developed and well-nourished.  Eyes: Pupils are equal, round, and reactive to light. No scleral icterus.  Neck: Neck supple.  Cardiovascular: Normal rate,  regular rhythm and intact distal pulses.   Pulmonary/Chest: Effort normal and breath sounds normal. She has no wheezes. She has no rales.  Abdominal: Soft. Bowel sounds are normal. There is no tenderness.  Musculoskeletal:  Right lower calf with ecchymosis and minimal swelling that is localized around central area of erythema that is present. No warmth noted. See photo below.  Wells score is noted as negative 2.   Lymphadenopathy:    She has no cervical adenopathy.  Neurological: She is alert and oriented to person, place, and time. She has normal strength.  Reflex Scores:      Patellar reflexes are 2+ on the right side and 2+ on the left side. Skin: Skin is warm and dry. No rash noted.             Assessment & Plan:  1. Hematoma Hematoma present on exam; history of Plavix use with an active trigger of "bumping into things". Very low suspicion of DVT (Wells score is negative 2 with no history to support concern) or cellulitis. Advised close follow up and seek care if she has increased pain, swelling or discoloration increases, SOB, or fever.  We discussed that if symptoms do not improve, an ultrasound can be ordered. She voiced understanding and agreed with plan.  Advised her to follow up with Dr. Yong Channel as recommended or sooner if needed.  Delano Metz, FNP-C

## 2017-01-21 NOTE — Patient Instructions (Addendum)
Please monitor bruising and seek evaluation if you develop pain, increase swelling, or area becomes warm.    Hematoma A hematoma is a collection of blood under the skin, in an organ, in a body space, in a joint space, or in other tissue. The blood can clot to form a lump that you can see and feel. The lump is often firm and may sometimes become sore and tender. Most hematomas get better in a few days to weeks. However, some hematomas may be serious and require medical care. Hematomas can range in size from very small to very large. What are the causes? A hematoma can be caused by a blunt or penetrating injury. It can also be caused by spontaneous leakage from a blood vessel under the skin. Spontaneous leakage from a blood vessel is more likely to occur in older people, especially those taking blood thinners. Sometimes, a hematoma can develop after certain medical procedures. What are the signs or symptoms?  A firm lump on the body.  Possible pain and tenderness in the area.  Bruising.Blue, dark blue, purple-red, or yellowish skin may appear at the site of the hematoma if the hematoma is close to the surface of the skin. For hematomas in deeper tissues or body spaces, the signs and symptoms may be subtle. For example, an intra-abdominal hematoma may cause abdominal pain, weakness, fainting, and shortness of breath. An intracranial hematoma may cause a headache or symptoms such as weakness, trouble speaking, or a change in consciousness. How is this diagnosed? A hematoma can usually be diagnosed based on your medical history and a physical exam. Imaging tests may be needed if your health care provider suspects a hematoma in deeper tissues or body spaces, such as the abdomen, head, or chest. These tests may include ultrasonography or a CT scan. How is this treated? Hematomas usually go away on their own over time. Rarely does the blood need to be drained out of the body. Large hematomas or those that  may affect vital organs will sometimes need surgical drainage or monitoring. Follow these instructions at home:  Apply ice to the injured area:  Put ice in a plastic bag.  Place a towel between your skin and the bag.  Leave the ice on for 20 minutes, 2-3 times a day for the first 1 to 2 days.  After the first 2 days, switch to using warm compresses on the hematoma.  Elevate the injured area to help decrease pain and swelling. Wrapping the area with an elastic bandage may also be helpful. Compression helps to reduce swelling and promotes shrinking of the hematoma. Make sure the bandage is not wrapped too tight.  If your hematoma is on a lower extremity and is painful, crutches may be helpful for a couple days.  Only take over-the-counter or prescription medicines as directed by your health care provider. Get help right away if:  You have increasing pain, or your pain is not controlled with medicine.  You have a fever.  You have worsening swelling or discoloration.  Your skin over the hematoma breaks or starts bleeding.  Your hematoma is in your chest or abdomen and you have weakness, shortness of breath, or a change in consciousness.  Your hematoma is on your scalp (caused by a fall or injury) and you have a worsening headache or a change in alertness or consciousness. This information is not intended to replace advice given to you by your health care provider. Make sure you discuss any questions  you have with your health care provider. Document Released: 05/26/2004 Document Revised: 03/19/2016 Document Reviewed: 03/22/2013 Elsevier Interactive Patient Education  2017 [place] NOW OFFER   [facility] FAST TRACK!!!  SAME DAY Appointments for ACUTE CARE  Such as: Sprains, Injuries, cuts, abrasions, rashes, muscle pain, joint pain, back pain Colds, flu, sore throats, headache, allergies, cough, fever  Ear pain, sinus and eye infections Abdominal pain,  nausea, vomiting, diarrhea, upset stomach Animal/insect bites  3 Easy Ways to Schedule: Walk-In Scheduling Call in scheduling Mychart Sign-up: [email or web address]

## 2017-01-26 ENCOUNTER — Telehealth: Payer: Self-pay | Admitting: Cardiology

## 2017-01-26 NOTE — Telephone Encounter (Signed)
error 

## 2017-01-29 ENCOUNTER — Encounter: Payer: Self-pay | Admitting: Cardiology

## 2017-01-29 ENCOUNTER — Ambulatory Visit (INDEPENDENT_AMBULATORY_CARE_PROVIDER_SITE_OTHER): Payer: Medicare Other | Admitting: Cardiology

## 2017-01-29 ENCOUNTER — Encounter (HOSPITAL_COMMUNITY): Payer: Self-pay

## 2017-01-29 ENCOUNTER — Ambulatory Visit (HOSPITAL_COMMUNITY)
Admission: RE | Admit: 2017-01-29 | Discharge: 2017-01-29 | Disposition: A | Discharge status: Home / Self Care | Payer: Medicare Other | Source: Ambulatory Visit | Attending: Cardiology | Admitting: Cardiology

## 2017-01-29 VITALS — BP 110/62 | HR 72 | Ht 61.0 in | Wt 151.0 lb

## 2017-01-29 DIAGNOSIS — Z87898 Personal history of other specified conditions: Secondary | ICD-10-CM

## 2017-01-29 DIAGNOSIS — I2510 Atherosclerotic heart disease of native coronary artery without angina pectoris: Secondary | ICD-10-CM | POA: Diagnosis not present

## 2017-01-29 DIAGNOSIS — R0600 Dyspnea, unspecified: Secondary | ICD-10-CM

## 2017-01-29 DIAGNOSIS — E782 Mixed hyperlipidemia: Secondary | ICD-10-CM

## 2017-01-29 DIAGNOSIS — I10 Essential (primary) hypertension: Secondary | ICD-10-CM | POA: Diagnosis not present

## 2017-01-29 DIAGNOSIS — R0602 Shortness of breath: Secondary | ICD-10-CM

## 2017-01-29 DIAGNOSIS — R600 Localized edema: Secondary | ICD-10-CM | POA: Diagnosis not present

## 2017-01-29 LAB — CBC WITH DIFFERENTIAL/PLATELET
Basophils Absolute: 0.1 10*3/uL (ref 0.0–0.2)
Basos: 1 %
EOS (ABSOLUTE): 0.2 10*3/uL (ref 0.0–0.4)
Eos: 2 %
Hematocrit: 39.1 % (ref 34.0–46.6)
Hemoglobin: 14 g/dL (ref 11.1–15.9)
Immature Grans (Abs): 0 10*3/uL (ref 0.0–0.1)
Immature Granulocytes: 0 %
Lymphocytes Absolute: 2.7 10*3/uL (ref 0.7–3.1)
Lymphs: 32 %
MCH: 30.2 pg (ref 26.6–33.0)
MCHC: 35.8 g/dL — ABNORMAL HIGH (ref 31.5–35.7)
MCV: 84 fL (ref 79–97)
Monocytes Absolute: 0.6 10*3/uL (ref 0.1–0.9)
Monocytes: 8 %
Neutrophils Absolute: 4.8 10*3/uL (ref 1.4–7.0)
Neutrophils: 57 %
Platelets: 287 10*3/uL (ref 150–379)
RBC: 4.64 x10E6/uL (ref 3.77–5.28)
RDW: 13.7 % (ref 12.3–15.4)
WBC: 8.4 10*3/uL (ref 3.4–10.8)

## 2017-01-29 LAB — COMPREHENSIVE METABOLIC PANEL
ALT: 20 IU/L (ref 0–32)
AST: 27 IU/L (ref 0–40)
Albumin/Globulin Ratio: 1.6 (ref 1.2–2.2)
Albumin: 4.5 g/dL (ref 3.5–4.8)
Alkaline Phosphatase: 81 IU/L (ref 39–117)
BUN/Creatinine Ratio: 26 (ref 12–28)
BUN: 26 mg/dL (ref 8–27)
Bilirubin Total: 0.6 mg/dL (ref 0.0–1.2)
CO2: 25 mmol/L (ref 18–29)
Calcium: 9.4 mg/dL (ref 8.7–10.3)
Chloride: 92 mmol/L — ABNORMAL LOW (ref 96–106)
Creatinine, Ser: 1.01 mg/dL — ABNORMAL HIGH (ref 0.57–1.00)
GFR calc Af Amer: 65 mL/min/{1.73_m2} (ref >59)
GFR calc non Af Amer: 57 mL/min/{1.73_m2} — ABNORMAL LOW (ref >59)
Globulin, Total: 2.8 g/dL (ref 1.5–4.5)
Glucose: 112 mg/dL — ABNORMAL HIGH (ref 65–99)
Potassium: 3.6 mmol/L (ref 3.5–5.2)
Sodium: 136 mmol/L (ref 134–144)
Total Protein: 7.3 g/dL (ref 6.0–8.5)

## 2017-01-29 LAB — D-DIMER, QUANTITATIVE: D-DIMER: 0.41 mg/L FEU (ref 0.00–0.49)

## 2017-01-29 LAB — TROPONIN T: Troponin T TROPT: <0.011 ng/mL (ref <0.011)

## 2017-01-29 LAB — PRO B NATRIURETIC PEPTIDE: NT-Pro BNP: 322 pg/mL — ABNORMAL HIGH (ref 0–301)

## 2017-01-29 MED ORDER — VALSARTAN 320 MG PO TABS: 320.0000 mg | ORAL_TABLET | Freq: Every day | ORAL | 3 refills | Status: DC

## 2017-01-29 NOTE — Addendum Note (Signed)
Addended by: Eulis Foster on: 01/29/2017 10:13 AM   Modules accepted: Orders

## 2017-01-29 NOTE — Patient Instructions (Addendum)
Medication Instructions:  STOP Exforge STOP HCTZ (Hydrochlorothiazide) START Valsartan 320 mg once daily STOP Colchicine   Labwork: TODAY - CBC, d-dimer, cholesterol, CMET, troponin, BNP   Testing/Procedures: Your physician has requested that you have a lower or upper extremity venous duplex. This test is an ultrasound of the veins in the legs or arms. It looks at venous blood flow that carries blood from the heart to the legs or arms. Allow one hour for a Lower Venous exam. Allow thirty minutes for an Upper Venous exam. There are no restrictions or special instructions.   Your physician has requested that you have an echocardiogram. Echocardiography is a painless test that uses sound waves to create images of your heart. It provides your doctor with information about the size and shape of your heart and how well your heart's chambers and valves are working. This procedure takes approximately one hour. There are no restrictions for this procedure.  Your physician has requested that you have an exercise stress myoview. For further information please visit [email or web address]. Please follow instruction sheet, as given.    Follow-Up: Your physician recommends that you schedule a follow-up appointment in: approximately 2 weeks after tests are complete with Dr. Meda Coffee or APP   If you need a refill on your cardiac medications before your next appointment, please call your pharmacy.   Thank you for choosing [facility]! Christen Bame, RN [phone]

## 2017-01-29 NOTE — Progress Notes (Signed)
Cardiology Office Note    Date:  01/29/2017   ID:  Felicia Perry [birth year], MRN [id number]  PCP:  Garret Reddish, MD  Cardiologist:  Dr. Meda Coffee  CC: 1 week follow up after switching Brilinta to plavix  History of Present Illness:  Felicia Perry is a 71 y.o. female with a history of recently diagnosed CAD (RCA CTO s/p complex PCI, 60% LCx disease, normal LVEF 08/14/16), HTN, PUD, asthma, anxiety who presents for follow up.  Recently admitted for abnormal stress test (done for recurrent syncope) and unstable angina, with PCI as above - her Cx disease was treated medically with recommendation to consider staged PCI if she had continued symptoms, LVEDP 15. DAPT for at least 1 year recommended. She was discharged with rx for Brilinta and high dose statin. 2D ceho 08/07/16: EF 55-60%, mild LVH, grade 1 DD, calcified MV annulus.  She lives at home with her addicted (heroin) son. This causes a lot of anxiety.   She was seen by Melina Copa PA-C in the office for post hospital follow up on 08/24/16. She reported significant exertional dyspnea ever since her PCI. This was felt possibly related to Brilinta and she was switched to plavix 75mg  daily with a 300mg  daily load (no effient with possible hx of TIA/CVA).  She was told not to drive given hx of syncope.  She saw Dr. Yong Channel, PCP,  on 08/25/16. No changes were made.   08/31/2016 - She can breath normally now and feeling so relieved about this. Within 24 hours of switching to plavix she was feeling completely better. She has been able to cook and do some house cleaning. She walks the dogs. 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 No chest pain or SOB. No LE edema, orthopnea or PND. Her appetite is back. No dizziness or syncope. She has had some heaviness her legs that she is related to the statin. No smoking.   10/20/2016 - the patient is coming after 6 weeks, she continues to have atypical upper midsternal right-sided  pressure-like pain, these are not related to exertion, she continues to work 8-10 hour days now feels more tired than before. Her shortness of breath with exertion has significantly improved. She just overall feels very anxious and depressed about the situation. She's been compliant with her meds but has not the significant muscle and joint pain since starting Lipitor. No palpitations or syncope. No lower extremity edema but significant calf pain.  12/21/16 - 2 months follow up, in the interim she broke her foot and fell - head injury- normal CT head, no intracranial bleed. She has been experiencing chest pain when she bends down or stands upright, 2-3 minutes, squeezing, retrosternal, no radiation no SOB. Denies LE edema, no orthopnea, PND. She has significant stress in life - sons girlfriend overdosed, granddaughter moved out and is missing, she is worrying about loosing her business.   01/29/2017 - 4 weeks follow-up, the patient states she feels like she has absolutely no energy, she has hard time getting out of bed, she feels short of breath at rest. Chest also developed bruise and swelling in her left calf approximately week ago. She said that on 2 occasion she was so weak and fell down but she didn't lose consciousness. She denies any chest pain. She has been compliant with her medications.   Past Medical History:  Diagnosis Date  . Adenomatous polyp 11/23/2005  . Anxiety   . Coronary artery  disease    a. HC on 08/14/16 showed RCA CTO s/p PCI, 60% LCX disease managed medically with recs to consider staged PCI if continued symptoms.   Marland Kitchen DIVERTICULOSIS, COLON 04/22/2007       . GERD (gastroesophageal reflux disease)   . History of shingles   . Hypertension   . Hypertensive heart disease   . Internal hemorrhoids   . Osteoarthritis    "knees, hands, lower back" (08/14/2016)  . PUD (peptic ulcer disease)   . Sleep apnea    a. resolved with weight loss    Past Surgical History:  Procedure  Laterality Date  . BREAST SURGERY Left 1980s   Fibrous Tumors removed   . CARDIAC CATHETERIZATION N/A 08/14/2016   Procedure: Left Heart Cath and Coronary Angiography;  Surgeon: Sherren Mocha, MD;  Location: [facility];  Service: Cardiovascular;  Laterality: N/A;  . CARDIAC CATHETERIZATION N/A 08/14/2016   Procedure: Coronary Stent Intervention;  Surgeon: Sherren Mocha, MD;  Location: [facility];  Service: Cardiovascular;  Laterality: N/A;  . CATARACT EXTRACTION W/ INTRAOCULAR LENS  IMPLANT, BILATERAL Bilateral 05/2009  . CORONARY ANGIOPLASTY    . TUBAL LIGATION  1970s    Current Medications: Outpatient Medications Prior to Visit  Medication Sig Dispense Refill  . acetaminophen (TYLENOL) 325 MG tablet Take 650 mg by mouth every 6 (six) hours as needed.    Marland Kitchen albuterol (PROVENTIL HFA;VENTOLIN HFA) 108 (90 Base) MCG/ACT inhaler Inhale 2 puffs into the lungs every 6 (six) hours as needed for wheezing or shortness of breath. 1 Inhaler 5  . alprazolam (XANAX) 2 MG tablet TAKE 1/2 TO 1 TABLET AT BEDTIME AS NEEDED FOR SLEEP 30 tablet 2  . aspirin EC 81 MG tablet Take 1 tablet (81 mg total) by mouth daily. 90 tablet 3  . budesonide-formoterol (SYMBICORT) 160-4.5 MCG/ACT inhaler Inhale 2 puffs into the lungs 2 (two) times daily. 1 Inhaler 3  . clopidogrel (PLAVIX) 75 MG tablet Take 4 tablets by mouth on day 1 (starting tonight) then take 1 tablet by mouth daily after that 94 tablet 3  . levocetirizine (XYZAL) 5 MG tablet TAKE ONE TABLET EVERY MORNING 30 tablet 11  . pravastatin (PRAVACHOL) 20 MG tablet Take 1 tablet (20 mg total) by mouth every evening. 30 tablet 11  . propranolol (INDERAL) 40 MG tablet TAKE ONE TABLET EACH DAY 30 tablet 5  . ranitidine (ZANTAC) 150 MG tablet Take 1 tablet (150 mg total) by mouth 2 (two) times daily. 60 tablet 11  . traMADol (ULTRAM) 50 MG tablet Take 100 mg by mouth every morning.     . venlafaxine XR (EFFEXOR-XR) 75 MG 24 hr capsule Take 1 capsule  (75 mg total) by mouth daily with breakfast. 90 capsule 1  . Vitamin D, Ergocalciferol, (DRISDOL) 50000 units CAPS capsule TAKE 1 CAPSULE EVERY 7 DAYS 10 capsule 1  . amLODipine-valsartan (EXFORGE) 10-320 MG tablet TAKE ONE TABLET EACH DAY 30 tablet 5  . colchicine (COLCRYS) 0.6 MG tablet Take 1 tablet (0.6 mg total) by mouth 2 (two) times daily. 120 tablet 1  . hydrochlorothiazide (HYDRODIURIL) 25 MG tablet Take 1 tablet (25 mg total) by mouth daily. 90 tablet 3   No facility-administered medications prior to visit.      Allergies:   Celexa [citalopram hydrobromide]; Cephalosporins; Clarithromycin; Clarithromycin; Doxycycline hyclate; Levofloxacin; Penicillins; and Zolpidem tartrate   Social History   Social History  . Marital status: Divorced    Spouse name: N/A  . Number  of children: N/A  . Years of education: N/A   Occupational History  . Cleaning    Social History Main Topics  . Smoking status: Former Smoker    Packs/day: 1.00    Years: 26.00    Types: Cigarettes    Quit date: 1989  . Smokeless tobacco: Never Used  . Alcohol use No  . Drug use: No  . Sexual activity: Not Currently   Other Topics Concern  . None   Social History Narrative   Divorced for 38 years in 2016. 2 sons.  2 granddaughters.       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       Hobbies: watch tv-survivor, dancing with the stars, enjoy alone time. Best friend [name] patient of Dr. Yong Channel. Enjoys work, time on Teaching laboratory technician.      Family History:  The patient's family history includes COPD in her father; Diabetes in her mother; Heart disease in her mother; Hypertension in her mother.     ROS:   Please see the history of present illness.    ROS All other systems reviewed and are negative.   PHYSICAL EXAM:   VS:  BP 110/62   Pulse 72   Ht 5\' 1"  (1.549 m)   Wt 151 lb (68.5 kg)   SpO2 97%   BMI 28.53 kg/m    GEN: Well nourished, well developed, in no  acute distress  HEENT: normal  Neck: no JVD, carotid bruits, or masses Cardiac: RRR; no murmurs, rubs, or gallops, Swelling and hard spot in her left calf.  Respiratory:  clear to auscultation bilaterally, normal work of breathing GI: soft, nontender, nondistended, + BS MS: no deformity or atrophy  Skin: warm and dry, no rash,  Neuro:  Alert and Oriented x 3, Strength and sensation are intact Psych: euthymic mood, full affect  Wt Readings from Last 3 Encounters:  01/29/17 151 lb (68.5 kg)  01/21/17 152 lb 6.4 oz (69.1 kg)  12/21/16 152 lb (68.9 kg)    Studies/Labs Reviewed:   EKG:  EKG is NOT ordered today.    Recent Labs: 10/20/2016: ALT 13; TSH 1.37 11/17/2016: BUN 16; Creatinine, Ser 1.07; Hemoglobin 13.5; Platelets 307; Potassium 3.6; Sodium 136   Lipid Panel    Component Value Date/Time   CHOL 98 10/20/2016 0934   TRIG 117 10/20/2016 0934   HDL 34 (L) 10/20/2016 0934   CHOLHDL 2.9 10/20/2016 0934   VLDL 23 10/20/2016 0934   LDLCALC 41 10/20/2016 0934   LDLDIRECT 91.9 02/28/2007 0947    Additional studies/ records that were reviewed today include:   Procedures 08/14/16 Coronary Stent Intervention  Left Heart Cath and Coronary Angiography  Conclusion  1. Severe stenosis with chronic total occlusion of the mid right coronary artery, treated successfully with complex stenting as detailed 2. Moderately severe stenosis of the mid left circumflex involving a large obtuse marginal branch 3. Patency of the LAD with mild nonobstructive disease 4. Normal LV function by noninvasive assessment  Recommendations: Dual antiplatelet therapy with aspirin and brilinta for at least one year. Consider staged PCI of the left circumflex bifurcation if recurrent anginal symptoms arise.    2D ECHO 08/07/16: EF 55-60%, mild LVH, grade 1 DD, calcified MV annulus.  EKG performed today 01/29/2017 was personally reviewed and shows normal sinus rhythm normal EKG and unchanged from  prior.   ASSESSMENT & PLAN:   Dyspnea: resolved off the Brilinta. She has underwent  a stress test in November 2017 that was negative for ischemia showed hyperdynamic LV function and hypertensive response to stress, hydrochlorothiazide was added to her regimen. She now has worsening dyspnea, with unilateral LE edema, there is suspicion for DVT and possibly pulmonary embolism. We will order a LE venous Duplex for later today. We will order Troponin, CMP, CBC, troponin, BNP. Her heart sounds are distant, I will order an echocardiogram.  CAD: continue ASA/plavix, statin and BB. She has a history of ulcers and will be maintained on the enteric coated aspirin, great lipids in 09/2016.  HTN: BP rather low with two falls, I will d/c HCTZ and amlodipine.  H/o Syncope: no driving for 6 months after event. A exercise myoview has been arranged by Dr. Meda Coffee. Her stress test was negative she is allowed to drive.   Hyperlipidemia - with significant muscle pain and joint pain on atorvastatin, tolerating pravastatin 20 mg daily. Lipids all at goal in 09/2016.  Medication Adjustments/Labs and Tests Ordered: Current medicines are reviewed at length with the patient today.  Concerns regarding medicines are outlined above.  Medication changes, Labs and Tests ordered today are listed in the Patient Instructions below. Patient Instructions  Medication Instructions:  STOP Exforge STOP HCTZ (Hydrochlorothiazide) START Valsartan 320 mg once daily STOP Colchicine   Labwork: TODAY - CBC, d-dimer, cholesterol, CMET, troponin, BNP   Testing/Procedures: Your physician has requested that you have a lower or upper extremity venous duplex. This test is an ultrasound of the veins in the legs or arms. It looks at venous blood flow that carries blood from the heart to the legs or arms. Allow one hour for a Lower Venous exam. Allow thirty minutes for an Upper Venous exam. There are no restrictions or special  instructions.   Your physician has requested that you have an echocardiogram. Echocardiography is a painless test that uses sound waves to create images of your heart. It provides your doctor with information about the size and shape of your heart and how well your heart's chambers and valves are working. This procedure takes approximately one hour. There are no restrictions for this procedure.  Your physician has requested that you have an exercise stress myoview. For further information please visit [email or web address]. Please follow instruction sheet, as given.    Follow-Up: Your physician recommends that you schedule a follow-up appointment in: approximately 2 weeks after tests are complete with Dr. Meda Coffee or APP   If you need a refill on your cardiac medications before your next appointment, please call your pharmacy.   Thank you for choosing [facility]! Christen Bame, RN [phone]       Signed, Ena Dawley, MD  01/29/2017 9:53 AM    [address], [place], [place]  [address] Phone: [phone]; Fax: [phone]

## 2017-01-29 NOTE — Addendum Note (Signed)
Addended by: Eulis Foster on: 01/29/2017 10:14 AM   Modules accepted: Orders

## 2017-01-29 NOTE — Progress Notes (Signed)
Today's bilateral lower extremity venous duplex is negative for DVT. There is a hematoma left mid calf. Preliminary results given to Dr. Meda Coffee.

## 2017-02-06 ENCOUNTER — Other Ambulatory Visit: Payer: Self-pay | Admitting: Family Medicine

## 2017-02-09 ENCOUNTER — Telehealth (HOSPITAL_COMMUNITY): Payer: Self-pay | Admitting: *Deleted

## 2017-02-09 NOTE — Telephone Encounter (Signed)
Patient given detailed instructions per Myocardial Perfusion Study Information Sheet for the test on 02/12/17 at 0845. Patient notified to arrive 15 minutes early and that it is imperative to arrive on time for appointment to keep from having the test rescheduled.  If you need to cancel or reschedule your appointment, please call the office within 24 hours of your appointment. Failure to do so may result in a cancellation of your appointment, and a $50 no show fee. Patient verbalized understanding.Magaline Steinberg, Ranae Palms

## 2017-02-12 ENCOUNTER — Ambulatory Visit (HOSPITAL_COMMUNITY): Payer: Medicare Other | Attending: Cardiology

## 2017-02-12 ENCOUNTER — Ambulatory Visit (HOSPITAL_BASED_OUTPATIENT_CLINIC_OR_DEPARTMENT_OTHER): Payer: Medicare Other

## 2017-02-12 ENCOUNTER — Other Ambulatory Visit: Payer: Self-pay

## 2017-02-12 DIAGNOSIS — I2510 Atherosclerotic heart disease of native coronary artery without angina pectoris: Secondary | ICD-10-CM

## 2017-02-12 DIAGNOSIS — I10 Essential (primary) hypertension: Secondary | ICD-10-CM | POA: Diagnosis not present

## 2017-02-12 DIAGNOSIS — E782 Mixed hyperlipidemia: Secondary | ICD-10-CM

## 2017-02-12 DIAGNOSIS — R0602 Shortness of breath: Secondary | ICD-10-CM

## 2017-02-12 DIAGNOSIS — R0600 Dyspnea, unspecified: Secondary | ICD-10-CM

## 2017-02-12 DIAGNOSIS — R600 Localized edema: Secondary | ICD-10-CM | POA: Diagnosis not present

## 2017-02-12 DIAGNOSIS — I083 Combined rheumatic disorders of mitral, aortic and tricuspid valves: Secondary | ICD-10-CM | POA: Insufficient documentation

## 2017-02-12 DIAGNOSIS — Z87898 Personal history of other specified conditions: Secondary | ICD-10-CM

## 2017-02-12 LAB — MYOCARDIAL PERFUSION IMAGING
LV dias vol: 64 mL (ref 46–106)
LV sys vol: 17 mL
Peak HR: 106 {beats}/min
RATE: 0.26
Rest HR: 59 {beats}/min
SDS: 4
SRS: 5
SSS: 9
TID: 0.9

## 2017-02-12 MED ORDER — TECHNETIUM TC 99M TETROFOSMIN IV KIT
32.8000 | PACK | Freq: Once | INTRAVENOUS | Status: AC | PRN
  Administered 2017-02-12: 32.8 via INTRAVENOUS
  Filled 2017-02-12: qty 33

## 2017-02-12 MED ORDER — REGADENOSON 0.4 MG/5ML IV SOLN
0.4000 mg | Freq: Once | INTRAVENOUS | Status: AC
  Administered 2017-02-12: 0.4 mg via INTRAVENOUS

## 2017-02-12 MED ORDER — TECHNETIUM TC 99M TETROFOSMIN IV KIT
10.7000 | PACK | Freq: Once | INTRAVENOUS | Status: AC | PRN
  Administered 2017-02-12: 10.7 via INTRAVENOUS
  Filled 2017-02-12: qty 11

## 2017-02-15 ENCOUNTER — Telehealth: Payer: Self-pay | Admitting: *Deleted

## 2017-02-15 MED ORDER — FUROSEMIDE 20 MG PO TABS: ORAL_TABLET | ORAL | 6 refills | Status: DC

## 2017-02-15 NOTE — Telephone Encounter (Signed)
Made pt aware of echo results per Dr Meda Coffee, as indicated in this note.  Informed the pt that per Dr Meda Coffee, she recommends that we start her on lasix 20 mg po daily x 1 week, then take lasix 20 mg po daily as needed for lower extremity edema, thereafter.  Confirmed the pharmacy of choice with the pt.  Pt verbalized understanding and agrees with this plan.

## 2017-02-15 NOTE — Telephone Encounter (Signed)
-----   Message from Dorothy Spark, MD sent at 02/14/2017 12:13 PM EDT ----- Normal LVEF, elevated filling pressures consistent with fluid overload, mild to moderate mitral regurgitation, normal right ventricular function.  I would add lasix 20 mg po daily for the next 1 week, afterwards as needed for LE edema.

## 2017-02-19 ENCOUNTER — Encounter: Payer: Self-pay | Admitting: Physician Assistant

## 2017-02-19 ENCOUNTER — Ambulatory Visit (INDEPENDENT_AMBULATORY_CARE_PROVIDER_SITE_OTHER): Payer: Medicare Other | Admitting: Physician Assistant

## 2017-02-19 VITALS — BP 150/84 | HR 70 | Ht 61.0 in | Wt 149.1 lb

## 2017-02-19 DIAGNOSIS — I2510 Atherosclerotic heart disease of native coronary artery without angina pectoris: Secondary | ICD-10-CM

## 2017-02-19 DIAGNOSIS — I10 Essential (primary) hypertension: Secondary | ICD-10-CM

## 2017-02-19 DIAGNOSIS — I5032 Chronic diastolic (congestive) heart failure: Secondary | ICD-10-CM

## 2017-02-19 DIAGNOSIS — R0602 Shortness of breath: Secondary | ICD-10-CM

## 2017-02-19 MED ORDER — POTASSIUM CHLORIDE ER 10 MEQ PO TBCR: 10.0000 meq | EXTENDED_RELEASE_TABLET | Freq: Every day | ORAL | 1 refills | Status: DC

## 2017-02-19 MED ORDER — METOPROLOL SUCCINATE ER 50 MG PO TB24: 50.0000 mg | ORAL_TABLET | Freq: Every day | ORAL | 1 refills | Status: DC
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
## 2017-02-19 NOTE — Patient Instructions (Addendum)
Medication Instructions:  Your physician has recommended you make the following change in your medication:  1.  STOP the Propranolol 2.  START the Toprol XL 50 mg taking 1 tablet daily 3.  START Potassium 20 meq taking 1 tablet daily  Labwork: None ordered  Testing/Procedures: None ordered  Follow-Up: Your physician recommends that you schedule a follow-up appointment in: [place]   Any Other Special Instructions Will Be Listed Below (If Applicable).  ... Check your blood pressure daily, 2-3 hours after you take your blood pressure medciation, for about a week and call us with the readings.     If you need a refill on your cardiac medications before your next appointment, please call your pharmacy.

## 2017-02-25 ENCOUNTER — Ambulatory Visit: Payer: Medicare Other | Admitting: Cardiology

## 2017-03-04 ENCOUNTER — Other Ambulatory Visit: Payer: Self-pay | Admitting: Family Medicine

## 2017-03-08 ENCOUNTER — Telehealth: Payer: Self-pay | Admitting: Physician Assistant

## 2017-03-08 MED ORDER — AMLODIPINE BESYLATE 5 MG PO TABS
5.0000 mg | ORAL_TABLET | Freq: Every day | ORAL | 1 refills | Status: DC
Start: 2017-03-08 — End: 2017-04-08

## 2017-03-08 NOTE — Telephone Encounter (Signed)
Returned pts call and advised her of the addition of Amlodipine 5 mg qd and to monitor bp X's 1 week and call if bp running >871 systolic or >95 diastolic. Pt was also advised to monitor her salt intake, which pt was given education re: reading labels on cans and informed her that the sodium amount is not per can, but per serving. Pt states that she will do much better in that area, but she was trying. Pt very appreciative.

## 2017-03-08 NOTE — Telephone Encounter (Signed)
t was told to take her BP for a week, she did am and pm in both arms. She gave me an average-177/96 in left arm the first day May 7th, and today was 171-97 also in left arm-results have been pretty consistent -pls advise

## 2017-03-08 NOTE — Telephone Encounter (Signed)
Add amlodipine 5mg  daily. Continue to follow BP. Call in 1 week if SBP still running >592 systolic or >76 diastolic. Watch salt - limit to 2000mg  a day.  Dayna Dunn PA-C

## 2017-03-11 ENCOUNTER — Telehealth: Payer: Self-pay | Admitting: Physician Assistant

## 2017-03-11 NOTE — Telephone Encounter (Signed)
New Message    Pt c/o BP issue: STAT if pt c/o blurred vision, one-sided weakness or slurred speech  1. What are your last 5 BP readings? 155/87 Monday , 157/88 Tuesday , this morning 155/100, pt states on medications as told   2. Are you having any other symptoms (ex. Dizziness, headache, blurred vision, passed out)? no  3. What is your BP issue? Still running high with new medications - Pt was instructed to call back and let us know how she is doing on the medications that Melina Copa has prescribed

## 2017-03-11 NOTE — Telephone Encounter (Signed)
Is she taking these readings at least 2-3 hours after blood pressure medication?  If so, would increase amlodpine to 10mg  daily and schedule early visit in HTN clinic. Thanks! Dayna Dunn PA-C

## 2017-03-11 NOTE — Telephone Encounter (Signed)
Called pt back re: blood pressure readings. She states that the majority of these readings are not after she takes her medication. She has been advised to check it Friday - Monday 2-3 hours after she takes her bp medication and to call us back on Monday afternoon. Pt agreeable with this and verbalized appreciation for the call.

## 2017-04-06 ENCOUNTER — Other Ambulatory Visit: Payer: Self-pay | Admitting: Family Medicine

## 2017-04-06 NOTE — Telephone Encounter (Signed)
Yes thanks may fill. Please call her and inform her I want to trial her on 1mg  maximum each night as she is on a rather high dose. Can give #30 with 5 refills of xanax 1mg  PO qhs prn for sleep

## 2017-04-07 NOTE — Telephone Encounter (Signed)
Called and spoke to patient who states she does have concerns about 1 mg being effective for her. I made an appointment for her and she is coming on tomorrow afternoon. She wants to discuss with you then. I have not sent this in yet.

## 2017-04-08 ENCOUNTER — Ambulatory Visit (INDEPENDENT_AMBULATORY_CARE_PROVIDER_SITE_OTHER): Payer: Medicare Other | Admitting: Family Medicine

## 2017-04-08 ENCOUNTER — Encounter: Payer: Self-pay | Admitting: Family Medicine

## 2017-04-08 DIAGNOSIS — G894 Chronic pain syndrome: Secondary | ICD-10-CM

## 2017-04-08 DIAGNOSIS — F5101 Primary insomnia: Secondary | ICD-10-CM

## 2017-04-08 DIAGNOSIS — I10 Essential (primary) hypertension: Secondary | ICD-10-CM | POA: Diagnosis not present

## 2017-04-08 MED ORDER — ALPRAZOLAM 1 MG PO TABS: ORAL_TABLET | ORAL | 5 refills | Status: DC

## 2017-04-08 MED ORDER — AMLODIPINE BESYLATE 10 MG PO TABS: 10.0000 mg | ORAL_TABLET | Freq: Every day | ORAL | 5 refills | Status: DC

## 2017-04-08 NOTE — Assessment & Plan Note (Signed)
S:  Tramadol under Dr. Charlestine Night in the past- primarily for knees now better under Dr. Darliss Ridgel care. Dr. Charlestine Night has now retired. She is down from 2 tramadol a day now 1 a day. A/P: Advised prn use of tramadol only- I would be willing to take over rx

## 2017-04-08 NOTE — Assessment & Plan Note (Signed)
S: Patient has been taking 2mg  xanax for sleep for years. With half tablet only sleeps 4-5 hours. Recent stressors with son being in jail due to opiate addiction. She is also on effexor for stress/anxiety- has been less tearful for this A/P: we discussed that 2mg  is relatively high dose and would like to see if we could control her symptoms with lower dose- opted to trial 1.5mg  as an intermediate between 1 and 2 mg. She is agreeable to trial.

## 2017-04-08 NOTE — Assessment & Plan Note (Signed)
S: poorly controlled on Propranolol 40mg --> metoprolol and valsartan 320 mg and amlodipine 5mg . In past had been on exforge with 320 mg valsartan and 10mg  amlodipine. Had tried to reduce amlodipine due to edema and diastolic CHF ASCVD 10 year risk calculation if age 71-79: on statin BP Readings from Last 3 Encounters:  04/08/17 (!) 146/78  02/19/17 (!) 150/84  01/29/17 110/62  A/P: We discussed blood pressure goal of <140/90. Continue current meds: but increase amlodipine to 10mg  and follow up 3 weeks.

## 2017-04-08 NOTE — Progress Notes (Signed)
Subjective:  Felicia Perry is a 71 y.o. year old very pleasant female patient who presents for/with See problem oriented charting ROS- low energy but states prior shortness of breath improved without lasix, no chest pain. No fever or cough.    Past Medical History-  Patient Active Problem List   Diagnosis Date Noted  . Coronary artery disease     Priority: High  . Syncope 08/04/2016    Priority: High  . H/O hyperparathyroidism 08/18/2016    Priority: Medium  . Vitamin D deficiency 08/20/2015    Priority: Medium  . Chronic pain syndrome 03/26/2015    Priority: Medium  . Insomnia 07/13/2014    Priority: Medium  . GERD (gastroesophageal reflux disease) 02/25/2011    Priority: Medium  . Depression 04/22/2007    Priority: Medium  . Essential hypertension 04/22/2007    Priority: Medium  . Asthma 04/22/2007    Priority: Medium  . Hyperglycemia 08/06/2015    Priority: Low  . Xerosis of skin 08/06/2015    Priority: Low  . Hypersomnia with sleep apnea 03/26/2015    Priority: Low  . Nonallopathic lesion of lumbosacral region 11/13/2014    Priority: Low  . Osteoarthritis of left lower extremity 10/17/2014    Priority: Low  . Chronic meniscal tear of knee 10/17/2014    Priority: Low  . Nonallopathic lesion of thoracic region 09/14/2014    Priority: Low  . Nonallopathic lesion-rib cage 08/21/2014    Priority: Low  . Tendinopathy of right rotator cuff 07/30/2014    Priority: Low  . Piriformis syndrome of right side 07/30/2014    Priority: Low  . Limited joint range of motion 07/13/2014    Priority: Low  . Rash 07/13/2014    Priority: Low  . CERVICAL DISC DISORDER 10/10/2007    Priority: Low  . LOW BACK PAIN 07/01/2007    Priority: Low  . Acute pericarditis 12/21/2016  . Nondisplaced fracture of first metatarsal bone, right foot, initial encounter for closed fracture 11/19/2016  . DOE (dyspnea on exertion) 09/02/2016  . PUD (peptic ulcer disease)   . Enlarged lymph nodes  05/19/2016    Medications- reviewed and updated Current Outpatient Prescriptions  Medication Sig Dispense Refill  . acetaminophen (TYLENOL) 325 MG tablet Take 650 mg by mouth every 6 (six) hours as needed.    Marland Kitchen albuterol (PROVENTIL HFA;VENTOLIN HFA) 108 (90 Base) MCG/ACT inhaler Inhale 2 puffs into the lungs every 6 (six) hours as needed for wheezing or shortness of breath. 1 Inhaler 5  . alprazolam (XANAX) 1 MG tablet TAKE 1 TO 1.5 TABLETS AT BEDTIME AS NEEDED FOR SLEEP 46 tablet 5  . amLODipine (NORVASC) 10 MG tablet Take 1 tablet (10 mg total) by mouth daily. 30 tablet 5  . aspirin EC 81 MG tablet Take 1 tablet (81 mg total) by mouth daily. 90 tablet 3  . budesonide-formoterol (SYMBICORT) 160-4.5 MCG/ACT inhaler Inhale 2 puffs into the lungs 2 (two) times daily. 1 Inhaler 3  . clopidogrel (PLAVIX) 75 MG tablet Take 4 tablets by mouth on day 1 (starting tonight) then take 1 tablet by mouth daily after that 94 tablet 3  . furosemide (LASIX) 20 MG tablet Take 20 mg by mouth daily for one week, then take 20 mg by mouth daily as needed for lower extremity swelling thereafter. 30 tablet 6  . levocetirizine (XYZAL) 5 MG tablet TAKE ONE TABLET EVERY MORNING 30 tablet 11  . metoprolol succinate (TOPROL-XL) 50 MG 24 hr tablet Take 1  tablet (50 mg total) by mouth daily. Take with or immediately following a meal. 90 tablet 1  . potassium chloride (K-DUR) 10 MEQ tablet Take 1 tablet (10 mEq total) by mouth daily. 90 tablet 1  . pravastatin (PRAVACHOL) 20 MG tablet Take 1 tablet (20 mg total) by mouth every evening. 30 tablet 11  . ranitidine (ZANTAC) 150 MG tablet TAKE ONE TABLET TWICE DAILY 60 tablet 5  . traMADol (ULTRAM) 50 MG tablet Take 100 mg by mouth every morning.     . valsartan (DIOVAN) 320 MG tablet Take 1 tablet (320 mg total) by mouth daily. 90 tablet 3  . venlafaxine XR (EFFEXOR-XR) 75 MG 24 hr capsule Take 1 capsule (75 mg total) by mouth daily with breakfast. 90 capsule 1  . Vitamin D,  Ergocalciferol, (DRISDOL) 50000 units CAPS capsule TAKE 1 CAPSULE EVERY 7 DAYS 10 capsule 1   No current facility-administered medications for this visit.     Objective: BP (!) 146/78   Pulse 79   Temp 98.3 F (36.8 C) (Oral)   Ht 5\' 1"  (1.549 m)   Wt 152 lb 6.4 oz (69.1 kg)   SpO2 96%   BMI 28.80 kg/m  Gen: NAD, resting comfortably CV: RRR no murmurs rubs or gallops Lungs: CTAB no crackles, wheeze, rhonchi Abdomen: overweight Ext: no edema Skin: warm, dry  Assessment/Plan:  Chronic pain syndrome S:  Tramadol under Dr. Charlestine Night in the past- primarily for knees now better under Dr. Darliss Ridgel care. Dr. Charlestine Night has now retired. She is down from 2 tramadol a day now 1 a day. A/P: Advised prn use of tramadol only- I would be willing to take over rx   Essential hypertension S: poorly controlled on Propranolol 40mg --> metoprolol and valsartan 320 mg and amlodipine 5mg . In past had been on exforge with 320 mg valsartan and 10mg  amlodipine. Had tried to reduce amlodipine due to edema and diastolic CHF ASCVD 10 year risk calculation if age 63-79: on statin BP Readings from Last 3 Encounters:  04/08/17 (!) 146/78  02/19/17 (!) 150/84  01/29/17 110/62  A/P: We discussed blood pressure goal of <140/90. Continue current meds: but increase amlodipine to 10mg  and follow up 3 weeks.     Insomnia S: Patient has been taking 2mg  xanax for sleep for years. With half tablet only sleeps 4-5 hours. Recent stressors with son being in jail due to opiate addiction. She is also on effexor for stress/anxiety- has been less tearful for this A/P: we discussed that 2mg  is relatively high dose and would like to see if we could control her symptoms with lower dose- opted to trial 1.5mg  as an intermediate between 1 and 2 mg. She is agreeable to trial.    Discussed following up in 3 weeks about blood pressure. Definitely see each other at least every 6 months.   Meds ordered this encounter  Medications  .  amLODipine (NORVASC) 10 MG tablet    Sig: Take 1 tablet (10 mg total) by mouth daily.    Dispense:  30 tablet    Refill:  5  . alprazolam (XANAX) 1 MG tablet    Sig: TAKE 1 TO 1.5 TABLETS AT BEDTIME AS NEEDED FOR SLEEP    Dispense:  46 tablet    Refill:  5    Return precautions advised.  Garret Reddish, MD

## 2017-04-08 NOTE — Patient Instructions (Addendum)
Increase amlodipine to 10mg . Update [place] or cardiology in 3 weeks  Trial 1.5 xanax 1mg  tablets each night. Hopefully this will reduce medication burden but still allow you to sleep

## 2017-04-09 NOTE — Progress Notes (Signed)
Thanks Dr. Yong Channel! Agree with your plan. Given her CAD, I would ideally like to see her BP closer to 120/80 (as best as we can, considering all the home stress she's under). Thank you so much for seeing her. Felicia Perry

## 2017-05-10 ENCOUNTER — Other Ambulatory Visit: Payer: Self-pay | Admitting: Family Medicine

## 2017-05-21 ENCOUNTER — Telehealth: Payer: Self-pay | Admitting: Pharmacist

## 2017-05-21 MED ORDER — IRBESARTAN 300 MG PO TABS: 300.0000 mg | ORAL_TABLET | Freq: Every day | ORAL | 3 refills | Status: DC

## 2017-05-21 NOTE — Telephone Encounter (Signed)
Received fax from [facility] that pt was affected by valsartan recall. Will change valsartan 320mg  daily to irbesartan 300mg  daily. Pt is aware and will monitor BP over the next few weeks.

## 2017-06-10 ENCOUNTER — Other Ambulatory Visit: Payer: Self-pay | Admitting: Family Medicine

## 2017-06-10 NOTE — Telephone Encounter (Signed)
Refill done.  

## 2017-06-18 ENCOUNTER — Encounter: Payer: Self-pay | Admitting: Cardiology

## 2017-06-18 ENCOUNTER — Ambulatory Visit (INDEPENDENT_AMBULATORY_CARE_PROVIDER_SITE_OTHER): Payer: Medicare Other | Admitting: Cardiology

## 2017-06-18 VITALS — BP 126/80 | HR 68 | Ht 61.0 in | Wt 157.0 lb

## 2017-06-18 DIAGNOSIS — I5032 Chronic diastolic (congestive) heart failure: Secondary | ICD-10-CM

## 2017-06-18 DIAGNOSIS — I2510 Atherosclerotic heart disease of native coronary artery without angina pectoris: Secondary | ICD-10-CM

## 2017-06-18 DIAGNOSIS — E782 Mixed hyperlipidemia: Secondary | ICD-10-CM | POA: Diagnosis not present

## 2017-06-18 DIAGNOSIS — R0602 Shortness of breath: Secondary | ICD-10-CM | POA: Diagnosis not present

## 2017-06-18 MED ORDER — NITROGLYCERIN 0.4 MG SL SUBL: 0.4000 mg | SUBLINGUAL_TABLET | SUBLINGUAL | 6 refills | Status: DC | PRN
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
## 2017-06-18 NOTE — Patient Instructions (Signed)
Medication Instructions:   STOP TAKING PLAVIX AT THE END OF October 2018 PER DR NELSON   DR NELSON HAS PRESCRIBED YOU SUBLINGUAL NITROGLYCERIN 0.4 MG--YOU MAY PLACE 1 TABLET UNDER THE TONGUE EVERY 5 MINS, AS NEEDED FOR CHEST PAIN---PLEASE TAKE NO MORE THAN 3 TABLETS---FOLLOW INSTRUCTIONS ON THE BOTTLE CAREFULLY.      Follow-Up:  Your physician wants you to follow-up in: [place] will receive a reminder letter in the mail two months in advance. If you don't receive a letter, please call our office to schedule the follow-up appointment.        If you need a refill on your cardiac medications before your next appointment, please call your pharmacy.

## 2017-07-13 ENCOUNTER — Other Ambulatory Visit: Payer: Self-pay | Admitting: Family Medicine

## 2017-07-20 ENCOUNTER — Telehealth: Payer: Self-pay | Admitting: Cardiology

## 2017-07-20 NOTE — Telephone Encounter (Signed)
yes

## 2017-07-20 NOTE — Telephone Encounter (Signed)
She can hold Plavix a week prior and restart a day post extraction, she should continue using aspirin.

## 2017-07-20 NOTE — Telephone Encounter (Addendum)
Dr. Meda Coffee, you wanted to stop her Plavix in October indefinitely right? Pt wanted me to make sure.  Instructed the pt that Dr. Sheralyn Boatman office needs to fax the clearance or call this into the office.  Will send this back to Dr Meda Coffee to advise if the pt can stop plavix all together starting in Oct.

## 2017-07-20 NOTE — Telephone Encounter (Signed)
Clearance has not been sent by Dr Allene Pyo.  Dr Meda Coffee, can you please advise on plavix clearance for upcoming teeth extraction.  Will fax accordingly thereafter, once recommendations received.  Dr Meda Coffee please review entire note about Plavix and clearance.

## 2017-07-20 NOTE — Telephone Encounter (Signed)
New message      Pt c/o medication issue:  1. Name of Medication:  plavix 2. How are you currently taking this medication (dosage and times per day)?  75mg   3. Are you having a reaction (difficulty breathing--STAT)? no 4. What is your medication issue?  Pt states that she was told by Dr Meda Coffee that she could stop her plavix in October.  She now needs to have a tooth extracted (clearance from Dr Allene Pyo has been faxed to Dr Meda Coffee).  Pt want to know if she will need to stay on the plavix a little longer because of the extraction?

## 2017-07-21 NOTE — Telephone Encounter (Signed)
Left a detailed message on the pts confirmed VM that per Dr Meda Coffee, she may stop Plavix in Oct, but she should continue taking her ASA 81 mg po daily.  Also left a detailed message on the pts VM that I will fax her clearance to Dr Sheralyn Boatman office, for Dr Meda Coffee advised on this.  Advised the pt to call back with any additional questions regarding this issue.

## 2017-07-21 NOTE — Telephone Encounter (Signed)
Follow up    Felicia Perry has stated she sent a fax today for the clearance  Please call her to conform when it has been received   or if your have not received it by 07-21-2017 by 12pm

## 2017-08-23 ENCOUNTER — Other Ambulatory Visit: Payer: Self-pay | Admitting: Physician Assistant

## 2017-09-06 ENCOUNTER — Telehealth: Payer: Self-pay | Admitting: Family Medicine

## 2017-09-06 NOTE — Telephone Encounter (Signed)
Patient called in reference to not being able to pull up my chart. Patient would like to talk to [facility] about appointment scheduled in December. Informed patient I could give her IT number for [facility] chart but patient was driving at the time and unable to right number down. Please call patient and advise.

## 2017-09-06 NOTE — Telephone Encounter (Signed)
Called and left a voicemail message asking patient for a return phone call

## 2017-09-06 NOTE — Telephone Encounter (Signed)
Returning missed call.  Ty,  -LL

## 2017-09-07 ENCOUNTER — Ambulatory Visit: Payer: Medicare Other | Admitting: Family Medicine

## 2017-09-07 NOTE — Telephone Encounter (Signed)
Called and left a voicemail message asking for a return phone call 

## 2017-09-09 NOTE — Telephone Encounter (Signed)
Spoke with patient who stated that she is scheduled for December. She wanted to know if it was a follow up or an AWV. She also states that she is only taking one xanax a night and has had no problems tapering down.

## 2017-09-10 ENCOUNTER — Other Ambulatory Visit: Payer: Self-pay | Admitting: Physician Assistant

## 2017-09-10 NOTE — Telephone Encounter (Signed)
Patient Instructions by Nuala Alpha, LPN at 5/75/0518 3:35 AM   Author: Nuala Alpha, LPN Author Type: Licensed Practical Nurse Filed: 06/18/2017 9:00 AM  Note Status: Signed Cosign: Cosign Not Required Encounter Date: 06/18/2017  Editor: Nuala Alpha, LPN (Licensed Practical Nurse)    Medication Instructions:   STOP TAKING PLAVIX AT THE END OF October 2018 PER DR Meda Coffee

## 2017-09-11 ENCOUNTER — Other Ambulatory Visit: Payer: Self-pay | Admitting: Cardiology

## 2017-09-13 ENCOUNTER — Telehealth: Payer: Self-pay | Admitting: Cardiology

## 2017-09-13 NOTE — Telephone Encounter (Signed)
Dorothy Spark, MD    5:25 PM  Note    yes    Me  to Dorothy Spark, MD       4:49 PM  Advise/clarify if you still want her to stop plavix starting in Oct?  Me      4:38 PM  Note    Dr. Meda Coffee, you wanted to stop her Plavix in October indefinitely right? Pt wanted me to make sure.  Instructed the pt that Dr. Sheralyn Boatman office needs to fax the clearance or call this into the office.  Will send this back to Dr Meda Coffee to advise if the pt can stop plavix all together starting in Oct.      AS INDICATED ABOVE FROM TELEPHONE ENCOUNTER WITH THE PT ON 07/20/17, DR Meda Coffee ADVISED THAT SHE MAY COME OFF OF PLAVIX AT THE END OF OCT, BUT SHE MUST CONTINUE TAKING ASA 81 MG PO DAILY.  ENDORSED THIS AGAIN TO THE PT.  TOOK PLAVIX INDEFINITELY OFF THE PTS MED LIST, AND ADVISED HER TO CONTINUE TAKING ASA 81 MG PO DAILY.  SCHEDULED THE PTS 6 MONTH FOLLOW-UP APPT WITH DR Meda Coffee FOR 11/29/17 AT 0840.  PT VERBALIZED UNDERSTANDING AND AGREES WITH THIS PLAN.

## 2017-09-13 NOTE — Telephone Encounter (Signed)
Left a message for the pt to call back.  

## 2017-09-13 NOTE — Telephone Encounter (Signed)
New message  Medication Notes      Nuala Alpha  Fri Jun 18, 2017 8:55 AM (EDT) STOP PLAVIX AT THE END OF OCT 2018 PER DR NELSON            Pt c/o medication issue:  1. Name of Medication: Plavix  2. How are you currently taking this medication (dosage and times per day)? n/a  3. Are you having a reaction (difficulty breathing--STAT)? n/a  4. What is your medication issue? Patient states Dr Meda Coffee wants her to resume medication. New script needed

## 2017-09-15 ENCOUNTER — Encounter: Payer: Self-pay | Admitting: Family Medicine

## 2017-09-15 ENCOUNTER — Ambulatory Visit (INDEPENDENT_AMBULATORY_CARE_PROVIDER_SITE_OTHER): Payer: Medicare Other | Admitting: Family Medicine

## 2017-09-15 VITALS — BP 130/80 | HR 76 | Ht 60.0 in | Wt 157.0 lb

## 2017-09-15 DIAGNOSIS — M533 Sacrococcygeal disorders, not elsewhere classified: Secondary | ICD-10-CM

## 2017-09-15 DIAGNOSIS — M999 Biomechanical lesion, unspecified: Secondary | ICD-10-CM

## 2017-09-15 DIAGNOSIS — M1712 Unilateral primary osteoarthritis, left knee: Secondary | ICD-10-CM

## 2017-09-15 NOTE — Patient Instructions (Signed)
Good to see you as always Have a great [place] day.  You are amazing and keep it up  Seem when you need me!

## 2017-09-15 NOTE — Assessment & Plan Note (Signed)
Worsening discomfort at this time.  We discussed with patient in great length.  Patient was given injection and tolerated the procedure well.  We discussed icing regimen and home exercise.  We discussed which activities of doing which wants to avoid.  Patient will come back in 4 weeks.  Could be a candidate for Visco supplementation.

## 2017-09-15 NOTE — Assessment & Plan Note (Signed)
Decision today to treat with OMT was based on Physical Exam  After verbal consent patient was treated with HVLA, ME, FPR techniques in cervical, thoracic, rib, lumbar and sacral areas  Patient tolerated the procedure well with improvement in symptoms  Patient given exercises, stretches and lifestyle modifications  See medications in patient instructions if given  Patient will follow up in 4 weeks 

## 2017-09-15 NOTE — Progress Notes (Signed)
[facility] [address] [place], [place] [address] Phone: [phone] Subjective:      CC: Left knee pain, back pain  SEG:[id number]  Felicia Perry is a 71 y.o. female coming in with complaint of back and neck pain.  Have seen patient multiple times.  Has responded very well to osteopathic manipulation.  Patient continues to be very active.  Patient states that unfortunately he is having increasing stiffness.  Sometimes can affect daily activities. Patient's left knee also giving her pain.  Known to have some mild arthritic changes.  Has had injections intermittently.  Over the course last several weeks had increasing discomfort and some mild instability.       Past Medical History:  Diagnosis Date  . Adenomatous polyp 11/23/2005  . Anxiety   . Chronic diastolic CHF (congestive heart failure) ([place])   . Coronary artery disease    a. HC on 08/14/16 showed RCA CTO s/p PCI, 60% LCX disease managed medically with recs to consider staged PCI if continued symptoms.   Marland Kitchen DIVERTICULOSIS, COLON 04/22/2007       . GERD (gastroesophageal reflux disease)   . History of shingles   . Hypertension   . Hypertensive heart disease   . Internal hemorrhoids   . Osteoarthritis    "knees, hands, lower back" (08/14/2016)  . PUD (peptic ulcer disease)   . Sleep apnea    a. resolved with weight loss   Past Surgical History:  Procedure Laterality Date  . BREAST SURGERY Left 1980s   Fibrous Tumors removed   . CARDIAC CATHETERIZATION N/A 08/14/2016   Procedure: Left Heart Cath and Coronary Angiography;  Surgeon: Sherren Mocha, MD;  Location: [facility];  Service: Cardiovascular;  Laterality: N/A;  . CARDIAC CATHETERIZATION N/A 08/14/2016   Procedure: Coronary Stent Intervention;  Surgeon: Sherren Mocha, MD;  Location: [facility];  Service: Cardiovascular;  Laterality: N/A;  . CATARACT EXTRACTION W/ INTRAOCULAR LENS  IMPLANT, BILATERAL Bilateral 05/2009   . CORONARY ANGIOPLASTY    . TUBAL LIGATION  1970s   Social History   Socioeconomic History  . Marital status: Divorced    Spouse name: Not on file  . Number of children: Not on file  . Years of education: Not on file  . Highest education level: Not on file  Social Needs  . Financial resource strain: Not on file  . Food insecurity - worry: Not on file  . Food insecurity - inability: Not on file  . Transportation needs - medical: Not on file  . Transportation needs - non-medical: Not on file  Occupational History  . Occupation: Cleaning  Tobacco Use  . Smoking status: Former Smoker    Packs/day: 1.00    Years: 26.00    Pack years: 26.00    Types: Cigarettes    Last attempt to quit: 1989    Years since quitting: 29.9  . Smokeless tobacco: Never Used  Substance and Sexual Activity  . Alcohol use: No  . Drug use: No  . Sexual activity: Not Currently  Other Topics Concern  . Not on file  Social History Narrative   Divorced for 38 years in 2016. 2 sons.  2 granddaughters.       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       Hobbies: watch tv-survivor, dancing with the stars, enjoy alone time. Best friend [name] patient of Dr. Yong Channel. Enjoys work,  time on computer.    Allergies  Allergen Reactions  . Celexa [Citalopram Hydrobromide]     psychosis  . Cephalosporins     REACTION: tongue swelling  . Clarithromycin     REACTION: blisters in mouth  . Clarithromycin   . Doxycycline Hyclate     REACTION: nausea, vomiting  . Levofloxacin     REACTION: tongue swells  . Penicillins     REACTION: per patient causes rash,hives  . Zolpidem Tartrate     REACTION: difficulty with concentration   Family History  Problem Relation Age of Onset  . Heart disease Mother   . Hypertension Mother   . Diabetes Mother   . COPD Father   . Colon cancer Neg Hx      Past medical history, social, surgical and family history all reviewed in  electronic medical record.  No pertanent information unless stated regarding to the chief complaint.   Review of Systems:Review of systems updated and as accurate as of 09/15/17  No headache, visual changes, nausea, vomiting, diarrhea, constipation, dizziness, abdominal pain, skin rash, fevers, chills, night sweats, weight loss, swollen lymph nodes, body aches, joint swelling, chest pain, shortness of breath, mood changes.  Positive muscle aches  Objective  There were no vitals taken for this visit. Systems examined below as of 09/15/17   General: No apparent distress alert and oriented x3 mood and affect normal, dressed appropriately.  HEENT: Pupils equal, extraocular movements intact  Respiratory: Patient's speak in full sentences and does not appear short of breath  Cardiovascular: No lower extremity edema, non tender, no erythema  Skin: Warm dry intact with no signs of infection or rash on extremities or on axial skeleton.  Abdomen: Soft nontender  Neuro: Cranial nerves II through XII are intact, neurovascularly intact in all extremities with 2+ DTRs and 2+ pulses.  Lymph: No lymphadenopathy of posterior or anterior cervical chain or axillae bilaterally.  Gait normal with good balance and coordination.  MSK:  Non tender with full range of motion and good stability and symmetric strength and tone of shoulders, elbows, wrist, hip, and ankles bilaterally.  Moderate arthritic changes of multiple joints  Knee: Left valgus deformity noted.  Abnormal thigh to calf ratio.  Tender to palpation over medial and PF joint line.  ROM full in flexion and extension and lower leg rotation. instability with valgus force.  painful patellar compression. Patellar glide with moderate crepitus. Patellar and quadriceps tendons unremarkable. Hamstring and quadriceps strength is normal. Contralateral knee shows mild arthritic changes but no pain.  After informed written and verbal consent, patient was  seated on exam table. Left knee was prepped with alcohol swab and utilizing anterolateral approach, patient's left knee space was injected with 4:1  marcaine 0.5%: Kenalog 40mg /dL. Patient tolerated the procedure well without immediate complications.  Osteopathic findings C2 flexed rotated and side bent right C4 flexed rotated and side bent left T3 extended rotated and side bent right inhaled third rib T6 extended rotated and side bent left L1 flexed rotated and side bent right Sacrum right on right    Impression and Recommendations:     This case required medical decision making of moderate complexity.      Note: This dictation was prepared with Dragon dictation along with smaller phrase technology. Any transcriptional errors that result from this process are unintentional.

## 2017-09-15 NOTE — Assessment & Plan Note (Signed)
Continues to have some difficulty.  Patient does respond well to osteopathic manipulation.  We discussed the importance of core strength and stability.  Patient will come back and see me again in 4-6 weeks.  Patient will increase activity slowly over the course of the next several days.  Follow-up again in 4 weeks

## 2017-09-21 ENCOUNTER — Other Ambulatory Visit: Payer: Self-pay | Admitting: Family Medicine

## 2017-09-21 NOTE — Telephone Encounter (Signed)
Refill done.  

## 2017-10-05 ENCOUNTER — Ambulatory Visit: Payer: Medicare Other | Admitting: Family Medicine

## 2017-10-11 ENCOUNTER — Other Ambulatory Visit: Payer: Self-pay | Admitting: Family Medicine

## 2017-10-14 ENCOUNTER — Encounter: Payer: Self-pay | Admitting: Family Medicine

## 2017-10-14 ENCOUNTER — Ambulatory Visit: Payer: Medicare Other | Admitting: Family Medicine

## 2017-10-14 VITALS — BP 154/84 | HR 74 | Ht 60.0 in | Wt 154.0 lb

## 2017-10-14 DIAGNOSIS — G4730 Sleep apnea, unspecified: Secondary | ICD-10-CM

## 2017-10-14 DIAGNOSIS — M999 Biomechanical lesion, unspecified: Secondary | ICD-10-CM | POA: Diagnosis not present

## 2017-10-14 DIAGNOSIS — G4710 Hypersomnia, unspecified: Secondary | ICD-10-CM

## 2017-10-14 DIAGNOSIS — R55 Syncope and collapse: Secondary | ICD-10-CM | POA: Diagnosis not present

## 2017-10-14 DIAGNOSIS — K279 Peptic ulcer, site unspecified, unspecified as acute or chronic, without hemorrhage or perforation: Secondary | ICD-10-CM | POA: Diagnosis not present

## 2017-10-14 DIAGNOSIS — G894 Chronic pain syndrome: Secondary | ICD-10-CM

## 2017-10-14 MED ORDER — PANTOPRAZOLE SODIUM 40 MG PO TBEC: 40.0000 mg | DELAYED_RELEASE_TABLET | Freq: Every day | ORAL | 3 refills | Status: DC

## 2017-10-14 NOTE — Patient Instructions (Signed)
Good to see you  Felicia Perry is your friend.  Iron 65mg  daily watch for constipation  Protonix 40mg  daily  We will get the ultrasound of the neck  We will discuss with Yong Channel about what else to do  If worsening symptoms please go to emergency room  See me again in 3 weeks

## 2017-10-14 NOTE — Assessment & Plan Note (Signed)
Decision today to treat with OMT was based on Physical Exam  After verbal consent patient was treated with HVLA, ME, FPR techniques in rib, thoracic, lumbar and sacral areas  Patient tolerated the procedure well with improvement in symptoms  Patient given exercises, stretches and lifestyle modifications  See medications in patient instructions if given  Patient will follow up in 3-4 weeks

## 2017-10-14 NOTE — Assessment & Plan Note (Signed)
Has done fairly well.  Patient has been on the pain still seems to be doing okay at the moment.  Patient was concerned because she does not seem to be crying but I think at this point with patient having a lot of anxiety recently no significant changes need to be made.  We will discussed possibly titrating down and follow-up in 3 weeks.

## 2017-10-14 NOTE — Assessment & Plan Note (Signed)
Patient unfortunately did have a syncopal event before patient needed stents.  Patient has been following up with cardiology and encouraged her to do it again.  Also will consider the possibility of a referral to neurology.  Patient will discuss with primary care provider who she is seeing next week.  Patient may need a MR angiogram of the head and neck we will get a Doppler ordered of the carotids to make sure that there is no blockage that could be contributing.  Encouraged her to continue on the statin,

## 2017-10-14 NOTE — Assessment & Plan Note (Signed)
Restarted Protonix secondary to muscle cramping likely secondary to small ulcer and iron deficiency.

## 2017-10-14 NOTE — Progress Notes (Signed)
[name] [address] [place], [place] [address] Phone: [phone] Subjective:    I'm seeing this patient by the request  of:    CC: Neck pain follow-up  [id number]  Felicia Perry is a 71 y.o. female coming in with complaint of neck and back pain.  Has responded very well to ostium patient also states that unfortunately she is having what appears to be syncopal events.  Patient states that is from time to time she feels like she stops breathing and then wakes up seconds later but does not know exactly what has happened.  Concerned because it has happened with her driving.  Was having these episodes before patient had her stenting of her heart.    Past Medical History:  Diagnosis Date  . Adenomatous polyp 11/23/2005  . Anxiety   . Chronic diastolic CHF (congestive heart failure) ([facility])   . Coronary artery disease    a. HC on 08/14/16 showed RCA CTO s/p PCI, 60% LCX disease managed medically with recs to consider staged PCI if continued symptoms.   Marland Kitchen DIVERTICULOSIS, COLON 04/22/2007       . GERD (gastroesophageal reflux disease)   . History of shingles   . Hypertension   . Hypertensive heart disease   . Internal hemorrhoids   . Osteoarthritis    "knees, hands, lower back" (08/14/2016)  . PUD (peptic ulcer disease)   . Sleep apnea    a. resolved with weight loss   Past Surgical History:  Procedure Laterality Date  . BREAST SURGERY Left 1980s   Fibrous Tumors removed   . CARDIAC CATHETERIZATION N/A 08/14/2016   Procedure: Left Heart Cath and Coronary Angiography;  Surgeon: Sherren Mocha, MD;  Location: [facility];  Service: Cardiovascular;  Laterality: N/A;  . CARDIAC CATHETERIZATION N/A 08/14/2016   Procedure: Coronary Stent Intervention;  Surgeon: Sherren Mocha, MD;  Location: [facility];  Service: Cardiovascular;  Laterality: N/A;  . CATARACT EXTRACTION W/ INTRAOCULAR LENS  IMPLANT, BILATERAL Bilateral 05/2009  .  CORONARY ANGIOPLASTY    . TUBAL LIGATION  1970s   Social History   Socioeconomic History  . Marital status: Divorced    Spouse name: None  . Number of children: None  . Years of education: None  . Highest education level: None  Social Needs  . Financial resource strain: None  . Food insecurity - worry: None  . Food insecurity - inability: None  . Transportation needs - medical: None  . Transportation needs - non-medical: None  Occupational History  . Occupation: Cleaning  Tobacco Use  . Smoking status: Former Smoker    Packs/day: 1.00    Years: 26.00    Pack years: 26.00    Types: Cigarettes    Last attempt to quit: 1989    Years since quitting: 29.9  . Smokeless tobacco: Never Used  Substance and Sexual Activity  . Alcohol use: No  . Drug use: No  . Sexual activity: Not Currently  Other Topics Concern  . None  Social History Narrative   Divorced for 38 years in 2016. 2 sons.  2 granddaughters.       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       Hobbies: watch tv-survivor, dancing with the stars, enjoy alone time. Best friend [name] patient of Dr. Yong Channel. Enjoys work, time on Teaching laboratory technician.    Allergies  Allergen Reactions  . Celexa [Citalopram  Hydrobromide]     psychosis  . Cephalosporins     REACTION: tongue swelling  . Clarithromycin     REACTION: blisters in mouth  . Clarithromycin   . Doxycycline Hyclate     REACTION: nausea, vomiting  . Levofloxacin     REACTION: tongue swells  . Penicillins     REACTION: per patient causes rash,hives  . Zolpidem Tartrate     REACTION: difficulty with concentration   Family History  Problem Relation Age of Onset  . Heart disease Mother   . Hypertension Mother   . Diabetes Mother   . COPD Father   . Colon cancer Neg Hx      Past medical history, social, surgical and family history all reviewed in electronic medical record.  No pertanent information unless stated  regarding to the chief complaint.   Review of Systems:Review of systems updated and as accurate as of 10/14/17  No nausea, vomiting, diarrhea, constipation, dizziness, abdominal pain, skin rash, fevers, chills, night sweats, weight loss, swollen lymph nodes, body aches, joint swelling, muscle aches, chest pain,  mood changes.  Positive muscle aches cramping, headaches, shortness of breath  Objective  Blood pressure (!) 154/84, pulse 74, height 5' (1.524 m), weight 154 lb (69.9 kg), SpO2 94 %. Systems examined below as of 10/14/17   General: No apparent distress alert and oriented x3 mood and affect normal, dressed appropriately.  HEENT: Pupils equal, extraocular movements intact  Respiratory: Patient's speak in full sentences and does not appear short of breath  Cardiovascular: No lower extremity edema, non tender, no erythema patient does have what appears to be a possible bruit of the right carotid Skin: Warm dry intact with no signs of infection or rash on extremities or on axial skeleton.  Abdomen: Soft but tender in the epigastric region Neuro: Cranial nerves II through XII are intact, neurovascularly intact in all extremities with 2+ DTRs and 2+ pulses.  Lymph: No lymphadenopathy of posterior or anterior cervical chain or axillae bilaterally.  Gait normal with good balance and coordination.  MSK:  Non tender with full range of motion and good stability and symmetric strength and tone of shoulders, elbows, wrist, hip, knee and ankles bilaterally.  Atrophic changes of multiple joints  Patient's neck does show some mild limitation in all planes with 5 degrees of sidebending bilaterally.  Patient does have some mild increasing in discomfort with Spurling's.  Osteopathic findings C6 flexed rotated and side bent left T3 extended rotated and side bent right inhaled third rib T9 extended rotated and side bent left L4 flexed rotated and side bent right Sacrum right on right        Impression and Recommendations:     This case required medical decision making of moderate complexity.      Note: This dictation was prepared with Dragon dictation along with smaller phrase technology. Any transcriptional errors that result from this process are unintentional.

## 2017-10-18 ENCOUNTER — Ambulatory Visit (HOSPITAL_COMMUNITY)
Admission: RE | Admit: 2017-10-18 | Discharge: 2017-10-18 | Disposition: A | Discharge status: Home / Self Care | Payer: Medicare Other | Source: Ambulatory Visit | Attending: Cardiovascular Disease | Admitting: Cardiovascular Disease

## 2017-10-18 DIAGNOSIS — I6523 Occlusion and stenosis of bilateral carotid arteries: Secondary | ICD-10-CM | POA: Diagnosis not present

## 2017-10-18 DIAGNOSIS — R55 Syncope and collapse: Secondary | ICD-10-CM

## 2017-10-20 ENCOUNTER — Other Ambulatory Visit: Payer: Self-pay

## 2017-10-20 DIAGNOSIS — R55 Syncope and collapse: Secondary | ICD-10-CM

## 2017-10-20 NOTE — Telephone Encounter (Signed)
Referral placed to Neurology. Called and spoke to patient. She verbalized understanding about not driving and stated that she has made arrangements.She will look for neurology to call her and get her scheduled.

## 2017-10-20 NOTE — Telephone Encounter (Signed)
-----   Message from Marin Olp, MD sent at 10/14/2017  9:31 PM EST ----- Dr. Tamala Julian- thanks for the update and for providing such good care for her. I like the neurology idea given the recurrence of  Syncope despite the stents and the motions she described. I would defer to their opinion about advanced neuroimaging  Roselyn Reef- lets go ahead and refer her to neurology under syncope. Also need to advise her unfortunately not to drive until she has had cardiac and neurological workup (6 months unless we find a clear cause of these events).   Garret Reddish  ----- Message ----- From: [name], DO Sent: 10/14/2017   4:27 PM To: Marin Olp, MD  Hey man This patient is describing some some what appears to be syncopal events.  Patient has no postictal phase but does state that there is some potential tonic-clonic movements.  Patient had these before and patient did have to have 3 stents placed.  Encouraged her to follow-up with cardiology and we will get carotid ultrasounds.  Did have a fairly recent nuclear stress test.  She wanted to discuss with you about other possible referral to neurology.  We may need to consider also a MR angiogram of the head and neck but will leave that up to you. Happy holidays!

## 2017-10-25 ENCOUNTER — Ambulatory Visit: Payer: Medicare Other | Admitting: Family Medicine

## 2017-10-25 ENCOUNTER — Encounter: Payer: Self-pay | Admitting: Family Medicine

## 2017-10-25 VITALS — BP 152/78 | HR 75 | Temp 98.5°F | Ht 60.0 in | Wt 154.0 lb

## 2017-10-25 DIAGNOSIS — J45909 Unspecified asthma, uncomplicated: Secondary | ICD-10-CM | POA: Diagnosis not present

## 2017-10-25 DIAGNOSIS — I25110 Atherosclerotic heart disease of native coronary artery with unstable angina pectoris: Secondary | ICD-10-CM

## 2017-10-25 DIAGNOSIS — I10 Essential (primary) hypertension: Secondary | ICD-10-CM

## 2017-10-25 DIAGNOSIS — R55 Syncope and collapse: Secondary | ICD-10-CM | POA: Diagnosis not present

## 2017-10-25 MED ORDER — BUDESONIDE-FORMOTEROL FUMARATE 160-4.5 MCG/ACT IN AERO: 2.0000 | INHALATION_SPRAY | Freq: Two times a day (BID) | RESPIRATORY_TRACT | 3 refills | Status: DC

## 2017-10-25 MED ORDER — ALPRAZOLAM 1 MG PO TABS: ORAL_TABLET | ORAL | 5 refills | Status: DC

## 2017-10-25 NOTE — Assessment & Plan Note (Signed)
S: poorly controlled on metoprolol 50mg  XR, irbesartan 300mg , amlodipine 5 mg. We increased amlodipine to 10mg  at that time. Also has lasix on hand- not having to use  Higher amlodipine = edema. HCTZ= hypercalcemia. When I saw her in June advised 3 week follow up to recheck BP at 146/78. Marland Kitchen   Home #s have been in 130s/70s.  BP Readings from Last 3 Encounters:  10/25/17 (!) 152/78. States very anxious about the coughing episodes and syncopal episodes  10/14/17 (!) 154/84  09/15/17 130/80  A/P: We discussed blood pressure goal of <140/90. Continue current meds given excellent home control. Recheck 3 months plus will be able to monitor BP at neuro and cardiology visits

## 2017-10-25 NOTE — Assessment & Plan Note (Signed)
S: patient presents for follow up after admitting to LOC/syncope to her sports medicine physician, Dr. Tamala Julian. She has had 2 episodes of coughing so much that she then loses consciousness. With the 2nd episode- she had a friend over- who said she was out for over a minute and when she woke up was completely her normal self. Apparently the right arm was shaking while she was passed out. Last episode was right before seeing Dr. Tamala Julian. One time she was coughing so pulled to side of road and passed out before waking up within a minute or two. She was able to fully stop the car and was in park. She stopped driving after this. Getting rides for work.  Patient thinks episodes are related to coughing. She has a history of asthma dating back to when Dr. Arnoldo Morale cared for her and usually has issues each winter. Due to cost, has run out of her symbicort though and is having daily coughing spells. She states she would get in coughing episodes and intentionally tries to suppress cough instead of allowing herself to breath (states embarrassing to cough so much). Has started breathing with coughing spells.  Feels tightening in neck with coughing spells and feels tired but otherwise no symptoms. Still coughing but has not had as severe an episode when she just allows herself to cough/breath. She feels her triggers are Candle, fireplace, overheated while cleaning, dampness    Patient had syncopal episodes before requiring stents last year after having abnormal stress test on 08/11/16 and stent into mid right coronary artery placed 08/14/16. Cath did show moderately severe stenosis of the mid left circumvlex involving a large obtuse marginal branch- there was note to consider staged PCI of left circumflex bifurcation if recurrent anginal symptoms"  Post stent had stress test 09/03/16 which was low risk and patient was allowed to drive after that point.  Unfortunately she has had recurrence of this recently. She saw Dr. Tamala Julian of  sports medicine who was kind enough to order carotid dopplers ( 1-39% stenosis right carotid. 40-59% on left- not at levels to cause syncope) and message me about considering neurology referral. Patient denied post ictal period but apparently did have some tonic-clonic movements potentially from note from Dr. Tamala Julian. We referred her to neurology at that time and she has an appointment 12/13/17. She also has cardiology follow up on 11/29/17.  A/P: Syncope likely due to coughing spells and trying to suppress the cough/hold her breath with episodes. Likely due to poorly controlled asthma- gave her 2 samples of symbicort today. EKG reassuring today  With that being said- given prior cardiac mediated syncope have advised her to move up cardiology appointment. Dr. Tamala Julian did duplex carotids which was not enough to cause event at 40-59% max. She did have some rhythmic moving of arm during one syncopal episode so have referred to neurology- she is in agreement to follow through with these and not to drive unless cardiology and neurology release her.

## 2017-10-25 NOTE — Assessment & Plan Note (Signed)
Advised her to follow up with cardiology due to blockage still present in circumvlex for their opinion but suspect cough mediated syncope

## 2017-10-25 NOTE — Assessment & Plan Note (Signed)
Refills for symbicort given through 2 samples. She needs to take this regularly in the winter.

## 2017-10-25 NOTE — Progress Notes (Signed)
Subjective:  Felicia Perry is a 71 y.o. year old very pleasant female patient who presents for/with See problem oriented charting ROS- no fever, chills. Has had cough, congestion. Admits to syncope but no ches tpain   Past Medical History-  Patient Active Problem List   Diagnosis Date Noted  . Coronary artery disease     Priority: High  . Syncope 08/04/2016    Priority: High  . H/O hyperparathyroidism 08/18/2016    Priority: Medium  . Vitamin D deficiency 08/20/2015    Priority: Medium  . Chronic pain syndrome 03/26/2015    Priority: Medium  . Insomnia 07/13/2014    Priority: Medium  . GERD (gastroesophageal reflux disease) 02/25/2011    Priority: Medium  . Depression 04/22/2007    Priority: Medium  . Essential hypertension 04/22/2007    Priority: Medium  . Asthma 04/22/2007    Priority: Medium  . Hyperglycemia 08/06/2015    Priority: Low  . Xerosis of skin 08/06/2015    Priority: Low  . Hypersomnia with sleep apnea 03/26/2015    Priority: Low  . Nonallopathic lesion of lumbosacral region 11/13/2014    Priority: Low  . Osteoarthritis of left lower extremity 10/17/2014    Priority: Low  . Chronic meniscal tear of knee 10/17/2014    Priority: Low  . Nonallopathic lesion of thoracic region 09/14/2014    Priority: Low  . Nonallopathic lesion-rib cage 08/21/2014    Priority: Low  . Tendinopathy of right rotator cuff 07/30/2014    Priority: Low  . Piriformis syndrome of right side 07/30/2014    Priority: Low  . Limited joint range of motion 07/13/2014    Priority: Low  . Rash 07/13/2014    Priority: Low  . SI (sacroiliac) joint dysfunction 10/10/2007    Priority: Low  . LOW BACK PAIN 07/01/2007    Priority: Low  . Degenerative arthritis of left knee 09/15/2017  . Acute pericarditis 12/21/2016  . Nondisplaced fracture of first metatarsal bone, right foot, initial encounter for closed fracture 11/19/2016  . DOE (dyspnea on exertion) 09/02/2016  . PUD (peptic ulcer  disease)   . Enlarged lymph nodes 05/19/2016    Medications- reviewed and updated Current Outpatient Medications  Medication Sig Dispense Refill  . acetaminophen (TYLENOL) 325 MG tablet Take 650 mg by mouth every 6 (six) hours as needed.    Marland Kitchen albuterol (PROVENTIL HFA;VENTOLIN HFA) 108 (90 Base) MCG/ACT inhaler Inhale 2 puffs into the lungs every 6 (six) hours as needed for wheezing or shortness of breath. 1 Inhaler 5  . alprazolam (XANAX) 1 MG tablet TAKE 1 TO 1.5 TABLETS AT BEDTIME AS NEEDED FOR SLEEP 46 tablet 5  . amLODipine (NORVASC) 10 MG tablet TAKE ONE TABLET EACH DAY 30 tablet 5  . aspirin EC 81 MG tablet Take 1 tablet (81 mg total) by mouth daily. 90 tablet 3  . budesonide-formoterol (SYMBICORT) 160-4.5 MCG/ACT inhaler Inhale 2 puffs into the lungs 2 (two) times daily. 1 Inhaler 3  . furosemide (LASIX) 20 MG tablet Take 20 mg by mouth daily for one week, then take 20 mg by mouth daily as needed for lower extremity swelling thereafter. 30 tablet 6  . irbesartan (AVAPRO) 300 MG tablet Take 1 tablet (300 mg total) by mouth daily. 90 tablet 3  . levocetirizine (XYZAL) 5 MG tablet TAKE ONE TABLET EVERY MORNING 90 tablet 3  . metoprolol succinate (TOPROL-XL) 50 MG 24 hr tablet TAKE ONE TABLET DAILY WITH OR IMMEDIATELY FOLLOWING A MEAL 90 tablet  2  . pantoprazole (PROTONIX) 40 MG tablet Take 1 tablet (40 mg total) by mouth daily. 30 tablet 3  . potassium chloride (K-DUR) 10 MEQ tablet TAKE ONE TABLET EACH DAY 90 tablet 2  . pravastatin (PRAVACHOL) 20 MG tablet Take 1 tablet (20 mg total) by mouth every evening. 30 tablet 11  . venlafaxine XR (EFFEXOR-XR) 75 MG 24 hr capsule TAKE ONE CAPSULE EACH DAY WITH BREAKFAST 90 capsule 1  . Vitamin D, Ergocalciferol, (DRISDOL) 50000 units CAPS capsule TAKE 1 CAPSULE EVERY 7 DAYS 10 capsule 0  . nitroGLYCERIN (NITROSTAT) 0.4 MG SL tablet Place 1 tablet (0.4 mg total) under the tongue every 5 (five) minutes as needed for chest pain. 25 tablet 6   No  current facility-administered medications for this visit.     Objective: BP (!) 152/78   Pulse 75   Temp 98.5 F (36.9 C) (Oral)   Ht 5' (1.524 m)   Wt 154 lb (69.9 kg)   SpO2 98%   BMI 30.08 kg/m  Gen: NAD, resting comfortably CV: RRR no murmurs rubs or gallops Lungs: CTAB no crackles, wheeze, rhonchi Ext: no edema Skin: warm, dry Neuro: CN II-XII intact, sensation and reflexes normal throughout, 5/5 muscle strength in bilateral upper and lower extremities. Normal finger to nose. Normal rapid alternating movements. No pronator drift. Normal romberg. Normal gait.   EKG: sinus rhythm with 1 PAC noted. Rate 76. Normal axis, norval intervals, no hypertrophy. No st or t wave changes.   Assessment/Plan:  Essential hypertension S: poorly controlled on metoprolol 50mg  XR, irbesartan 300mg , amlodipine 5 mg. We increased amlodipine to 10mg  at that time. Also has lasix on hand- not having to use  Higher amlodipine = edema. HCTZ= hypercalcemia. When I saw her in June advised 3 week follow up to recheck BP at 146/78. Marland Kitchen   Home #s have been in 130s/70s.  BP Readings from Last 3 Encounters:  10/25/17 (!) 152/78. States very anxious about the coughing episodes and syncopal episodes  10/14/17 (!) 154/84  09/15/17 130/80  A/P: We discussed blood pressure goal of <140/90. Continue current meds given excellent home control. Recheck 3 months plus will be able to monitor BP at neuro and cardiology visits  Syncope S: patient presents for follow up after admitting to LOC/syncope to her sports medicine physician, Dr. Tamala Julian. She has had 2 episodes of coughing so much that she then loses consciousness. With the 2nd episode- she had a friend over- who said she was out for over a minute and when she woke up was completely her normal self. Apparently the right arm was shaking while she was passed out. Last episode was right before seeing Dr. Tamala Julian. One time she was coughing so pulled to side of road and  passed out before waking up within a minute or two. She was able to fully stop the car and was in park. She stopped driving after this. Getting rides for work.  Patient thinks episodes are related to coughing. She has a history of asthma dating back to when Dr. Arnoldo Morale cared for her and usually has issues each winter. Due to cost, has run out of her symbicort though and is having daily coughing spells. She states she would get in coughing episodes and intentionally tries to suppress cough instead of allowing herself to breath (states embarrassing to cough so much). Has started breathing with coughing spells.  Feels tightening in neck with coughing spells and feels tired but otherwise no symptoms. Still coughing  but has not had as severe an episode when she just allows herself to cough/breath. She feels her triggers are Candle, fireplace, overheated while cleaning, dampness    Patient had syncopal episodes before requiring stents last year after having abnormal stress test on 08/11/16 and stent into mid right coronary artery placed 08/14/16. Cath did show moderately severe stenosis of the mid left circumvlex involving a large obtuse marginal branch- there was note to consider staged PCI of left circumflex bifurcation if recurrent anginal symptoms"  Post stent had stress test 09/03/16 which was low risk and patient was allowed to drive after that point.  Unfortunately she has had recurrence of this recently. She saw Dr. Tamala Julian of sports medicine who was kind enough to order carotid dopplers ( 1-39% stenosis right carotid. 40-59% on left- not at levels to cause syncope) and message me about considering neurology referral. Patient denied post ictal period but apparently did have some tonic-clonic movements potentially from note from Dr. Tamala Julian. We referred her to neurology at that time and she has an appointment 12/13/17. She also has cardiology follow up on 11/29/17.  A/P: Syncope likely due to coughing spells and  trying to suppress the cough/hold her breath with episodes. Likely due to poorly controlled asthma- gave her 2 samples of symbicort today. EKG reassuring today  With that being said- given prior cardiac mediated syncope have advised her to move up cardiology appointment. Dr. Tamala Julian did duplex carotids which was not enough to cause event at 40-59% max. She did have some rhythmic moving of arm during one syncopal episode so have referred to neurology- she is in agreement to follow through with these and not to drive unless cardiology and neurology release her.   Coronary artery disease Advised her to follow up with cardiology due to blockage still present in circumvlex for their opinion but suspect cough mediated syncope  Asthma Refills for symbicort given through 2 samples. She needs to take this regularly in the winter.    Future Appointments  Date Time Provider [place]  11/08/2017  3:30 PM Gary Fleet [facility]  11/29/2017  8:40 AM Dorothy Spark, MD [facility]  12/13/2017 10:30 AM Felecia Shelling, Nanine Means, MD [facility] None  02/01/2018  2:45 PM Marin Olp, MD [facility]   Return in about 3 months (around 01/23/2018). Or sooner if needed.    Orders Placed This Encounter  Procedures  . EKG 12-Lead    Order Specific Question:   Where should this test be performed    Answer:   Other    Meds ordered this encounter  Medications  . budesonide-formoterol (SYMBICORT) 160-4.5 MCG/ACT inhaler    Sig: Inhale 2 puffs into the lungs 2 (two) times daily.    Dispense:  1 Inhaler    Refill:  3  . ALPRAZolam (XANAX) 1 MG tablet    Sig: TAKE 1 TABLETS AT BEDTIME AS NEEDED FOR SLEEP    Dispense:  30 tablet    Refill:  5    Return precautions advised.  Garret Reddish, MD

## 2017-10-25 NOTE — Patient Instructions (Addendum)
Coughing fits and trying to suppress cough/hold breath could be causing the episodes of passing out. We need to get your asthma under better control- start the symbicort back to help control symptoms. If you have any more episodes- I want to see you back immediately. If you start coughing. I would immediately try to sit down on a chair with sides or lay down on the bed (including getting out of shower and laying on bathroom floor if needed)  No driving until cardiology and neurology evaluation at a minimum. Call Dr. Meda Coffee to see if you can be seen sooner.   Blood pressure slightly high- glad it has been controlled at home- see me back sooner than 3 months if you note blood pressure above 140/90 regularly.   Refilled xanax    Medication Samples have been provided to the patient.  Drug name: Symbicort       Strength: 160/4.5        Qty: 2  LOT: [id number]  Exp.Date: 08/2018  Dosing instructions: Inhale 2 puffs into the lungs 2 (two) times daily. - Inhalation  The patient has been instructed regarding the correct time, dose, and frequency of taking this medication, including desired effects and most common side effects.   Lonia Mad Southern Hizer 10:06 AM 10/25/2017

## 2017-10-28 ENCOUNTER — Other Ambulatory Visit: Payer: Self-pay | Admitting: Family Medicine

## 2017-11-08 ENCOUNTER — Ambulatory Visit: Payer: Medicare Other | Admitting: Family Medicine

## 2017-11-08 ENCOUNTER — Encounter: Payer: Self-pay | Admitting: Family Medicine

## 2017-11-08 VITALS — BP 140/80 | HR 71 | Ht 60.0 in | Wt 159.0 lb

## 2017-11-08 DIAGNOSIS — M999 Biomechanical lesion, unspecified: Secondary | ICD-10-CM | POA: Diagnosis not present

## 2017-11-08 DIAGNOSIS — M533 Sacrococcygeal disorders, not elsewhere classified: Secondary | ICD-10-CM | POA: Diagnosis not present

## 2017-11-08 NOTE — Assessment & Plan Note (Signed)
Continues to have some difficulty.  Seems to be doing relatively well overall.  I believe the patient is having some anxiety and depression that is also contributing to a lot of the discomfort and pain.  We discussed home exercises and icing regimen, we discussed which activities to doing which wants to avoid.  Patient will increase activity slowly over the course of next several weeks.  Follow-up with me again in 1-2 months.

## 2017-11-08 NOTE — Progress Notes (Signed)
[name] [address], [place] [address] Phone: [phone] Subjective:     CC: Back and neck pain  DUK:[id number]  Felicia Perry is a 72 y.o. female coming in with complaint of back and neck pain.  Patient has been seen previously and has responded fairly well to osteopathic manipulation.  Was doing very well with the Effexor as well.  Patient actually was having more syncopal episodes.  Patient did follow-up with primary care provider.  Patient's history was positive for an abnormal stress test back in October 2017.  Did have a stent placed in the mid right coronary artery the same month.  Patient was complaining of the episodes and we did order a carotid Doppler.  No significant stenosis noted.  Patient is following up with neurology on December 13, 2017 for these episodes as well as potential association with tonic-clonic movements.  Patient also has a follow-up with cardiology on November 29, 2017.  While she saw patient's primary care she did have an EKG done at that time that did not show any significant changes.     Past Medical History:  Diagnosis Date  . Adenomatous polyp 11/23/2005  . Anxiety   . Chronic diastolic CHF (congestive heart failure) ([place])   . Coronary artery disease    a. HC on 08/14/16 showed RCA CTO s/p PCI, 60% LCX disease managed medically with recs to consider staged PCI if continued symptoms.   Marland Kitchen DIVERTICULOSIS, COLON 04/22/2007       . GERD (gastroesophageal reflux disease)   . History of shingles   . Hypertension   . Hypertensive heart disease   . Internal hemorrhoids   . Osteoarthritis    "knees, hands, lower back" (08/14/2016)  . PUD (peptic ulcer disease)   . Sleep apnea    a. resolved with weight loss   Past Surgical History:  Procedure Laterality Date  . BREAST SURGERY Left 1980s   Fibrous Tumors removed   . CARDIAC CATHETERIZATION N/A 08/14/2016   Procedure: Left Heart Cath and Coronary Angiography;   Surgeon: Sherren Mocha, MD;  Location: [facility];  Service: Cardiovascular;  Laterality: N/A;  . CARDIAC CATHETERIZATION N/A 08/14/2016   Procedure: Coronary Stent Intervention;  Surgeon: Sherren Mocha, MD;  Location: [facility];  Service: Cardiovascular;  Laterality: N/A;  . CATARACT EXTRACTION W/ INTRAOCULAR LENS  IMPLANT, BILATERAL Bilateral 05/2009  . CORONARY ANGIOPLASTY    . TUBAL LIGATION  1970s   Social History   Socioeconomic History  . Marital status: Divorced    Spouse name: None  . Number of children: None  . Years of education: None  . Highest education level: None  Social Needs  . Financial resource strain: None  . Food insecurity - worry: None  . Food insecurity - inability: None  . Transportation needs - medical: None  . Transportation needs - non-medical: None  Occupational History  . Occupation: Cleaning  Tobacco Use  . Smoking status: Former Smoker    Packs/day: 1.00    Years: 26.00    Pack years: 26.00    Types: Cigarettes    Last attempt to quit: 1989    Years since quitting: 30.0  . Smokeless tobacco: Never Used  Substance and Sexual Activity  . Alcohol use: No  . Drug use: No  . Sexual activity: Not Currently  Other Topics Concern  . None  Social History Narrative   Divorced for 38 years in 2016. 2  sons.  2 granddaughters.       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       Hobbies: watch tv-survivor, dancing with the stars, enjoy alone time. Best friend [name] patient of Dr. Yong Channel. Enjoys work, time on Teaching laboratory technician.    Allergies  Allergen Reactions  . Celexa [Citalopram Hydrobromide]     psychosis  . Cephalosporins     REACTION: tongue swelling  . Clarithromycin     REACTION: blisters in mouth  . Clarithromycin   . Doxycycline Hyclate     REACTION: nausea, vomiting  . Levofloxacin     REACTION: tongue swells  . Penicillins     REACTION: per patient causes rash,hives  .  Zolpidem Tartrate     REACTION: difficulty with concentration   Family History  Problem Relation Age of Onset  . Heart disease Mother   . Hypertension Mother   . Diabetes Mother   . COPD Father   . Colon cancer Neg Hx      Past medical history, social, surgical and family history all reviewed in electronic medical record.  No pertanent information unless stated regarding to the chief complaint.   Review of Systems:Review of systems updated and as accurate as of 11/08/17  No visual changes, nausea, vomiting, diarrhea, constipation, dizziness, abdominal pain, skin rash, fevers, chills, night sweats, weight loss, swollen lymph nodes, body aches, joint swelling,  chest pain, shortness of breath, mood changes.  Positive muscle aches and headache  Objective  Blood pressure 140/80, pulse 71, height 5' (1.524 m), weight 159 lb (72.1 kg), SpO2 98 %. Systems examined below as of 11/08/17   General: No apparent distress alert and oriented x3 mood and affect normal, dressed appropriately.  HEENT: Pupils equal, extraocular movements intact  Respiratory: Patient's speak in full sentences and does not appear short of breath  Cardiovascular: No lower extremity edema, non tender, no erythema  Skin: Warm dry intact with no signs of infection or rash on extremities or on axial skeleton.  Abdomen: Soft nontender  Neuro: Cranial nerves II through XII are intact, neurovascularly intact in all extremities with 2+ DTRs and 2+ pulses.  Lymph: No lymphadenopathy of posterior or anterior cervical chain or axillae bilaterally.  Gait normal with good balance and coordination.  MSK: Mild tender with full range of motion and good stability and symmetric strength and tone of shoulders, elbows, wrist, hip, knee and ankles bilaterally.  Neck: Inspection mild loss of lordosis. No palpable stepoffs. Negative Spurling's maneuver. Lacking last 5-10 degrees of rotation bilaterally Grip strength and sensation normal in  bilateral hands Strength good C4 to T1 distribution No sensory change to C4 to T1 Negative Hoffman sign bilaterally Reflexes normal  Back Exam:  Inspection: Unremarkable  Motion: Flexion 45 deg, Extension 20 deg, Side Bending to 35 deg bilaterally,  Rotation to 45 deg bilaterally  SLR laying: Negative  XSLR laying: Negative  Palpable tenderness: To palpation the paraspinal musculature more in the lumbar spine especially at the thoracic lumbar region. FABER: Positive Faber bilaterally. Sensory change: Gross sensation intact to all lumbar and sacral dermatomes.  Reflexes: 2+ at both patellar tendons, 2+ at achilles tendons, Babinski's downgoing.  Strength at foot  Plantar-flexion: 5/5 Dorsi-flexion: 5/5 Eversion: 5/5 Inversion: 5/5  Leg strength  Quad: 5/5 Hamstring: 5/5 Hip flexor: 5/5 Hip abductors: 5/5  Gait unremarkable.   Osteopathic findings C2 flexed rotated and side bent right C4 flexed rotated and side bent  left T3 extended rotated and side bent right inhaled third rib T8 extended rotated and side bent left L2 flexed rotated and side bent right Sacrum right on right    Impression and Recommendations:     This case required medical decision making of moderate complexity.      Note: This dictation was prepared with Dragon dictation along with smaller phrase technology. Any transcriptional errors that result from this process are unintentional.

## 2017-11-08 NOTE — Assessment & Plan Note (Signed)
Decision today to treat with OMT was based on Physical Exam  After verbal consent patient was treated with HVLA, ME, FPR techniques in cervical, thoracic, lumbar and sacral areas  Patient tolerated the procedure well with improvement in symptoms  Patient given exercises, stretches and lifestyle modifications  See medications in patient instructions if given  Patient will follow up in 4 weeks 

## 2017-11-08 NOTE — Patient Instructions (Signed)
Good to see you  Felicia Perry is your friend.  The wall for 2 minutes Monitor at eye level.  See me again in 4-5 weeks

## 2017-11-19 ENCOUNTER — Other Ambulatory Visit: Payer: Self-pay | Admitting: Cardiology

## 2017-11-29 ENCOUNTER — Encounter: Payer: Self-pay | Admitting: Cardiology

## 2017-11-29 ENCOUNTER — Ambulatory Visit: Payer: Medicare Other | Admitting: Cardiology

## 2017-11-29 VITALS — BP 128/70 | HR 69 | Ht 60.0 in | Wt 161.0 lb

## 2017-11-29 DIAGNOSIS — I10 Essential (primary) hypertension: Secondary | ICD-10-CM

## 2017-11-29 DIAGNOSIS — Z789 Other specified health status: Secondary | ICD-10-CM

## 2017-11-29 DIAGNOSIS — E782 Mixed hyperlipidemia: Secondary | ICD-10-CM

## 2017-11-29 DIAGNOSIS — I2510 Atherosclerotic heart disease of native coronary artery without angina pectoris: Secondary | ICD-10-CM | POA: Diagnosis not present

## 2017-11-29 MED ORDER — ROSUVASTATIN CALCIUM 5 MG PO TABS: 5.0000 mg | ORAL_TABLET | ORAL | 3 refills | Status: DC

## 2017-11-29 NOTE — Patient Instructions (Signed)
Medication Instructions:   STOP TAKING PRAVASTATIN NOW  START TAKING ROSUVASTATIN 5 MG THREE TIMES WEEKLY--TAKE ON TUESDAYS, THURSDAYS, AND SATURDAYS    Your physician recommends that you return for lab work in: [place] --CMET, AND LIPIDS---PLEASE COME FASTING TO THIS LAB APPOINTMENT    Follow-Up:  Your physician wants you to follow-up in: [place] will receive a reminder letter in the mail two months in advance. If you don't receive a letter, please call our office to schedule the follow-up appointment.      If you need a refill on your cardiac medications before your next appointment, please call your pharmacy.
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
## 2017-12-06 NOTE — Progress Notes (Signed)
[name] [place], [place] [address] Phone: [phone] Subjective:    I'm seeing this patient by the request  of:    CC: Back and neck pain follow-up  NAT:[id number]  Felicia Perry is a 72 y.o. female coming in for back pain. Previously seen for OMT.  Back and neck pain.  Patient has had this previously.  Has responded fairly well to osteopathic manipulation.  Patient states that she is feeling somewhat better at this time.  Continues on the Effexor 75 mg.  Patient has seen her primary care provider and is having good success with this.  Controlling her blood pressure more as well which he thinks is helping.      Past Medical History:  Diagnosis Date  . Adenomatous polyp 11/23/2005  . Anxiety   . Chronic diastolic CHF (congestive heart failure) ([facility])   . Coronary artery disease    a. HC on 08/14/16 showed RCA CTO s/p PCI, 60% LCX disease managed medically with recs to consider staged PCI if continued symptoms.   Marland Kitchen DIVERTICULOSIS, COLON 04/22/2007       . GERD (gastroesophageal reflux disease)   . History of shingles   . Hypertension   . Hypertensive heart disease   . Internal hemorrhoids   . Osteoarthritis    "knees, hands, lower back" (08/14/2016)  . PUD (peptic ulcer disease)   . Sleep apnea    a. resolved with weight loss   Past Surgical History:  Procedure Laterality Date  . BREAST SURGERY Left 1980s   Fibrous Tumors removed   . CARDIAC CATHETERIZATION N/A 08/14/2016   Procedure: Left Heart Cath and Coronary Angiography;  Surgeon: Sherren Mocha, MD;  Location: [facility];  Service: Cardiovascular;  Laterality: N/A;  . CARDIAC CATHETERIZATION N/A 08/14/2016   Procedure: Coronary Stent Intervention;  Surgeon: Sherren Mocha, MD;  Location: [facility];  Service: Cardiovascular;  Laterality: N/A;  . CATARACT EXTRACTION W/ INTRAOCULAR LENS  IMPLANT, BILATERAL Bilateral 05/2009  . CORONARY ANGIOPLASTY    .  TUBAL LIGATION  1970s   Social History   Socioeconomic History  . Marital status: Divorced    Spouse name: None  . Number of children: None  . Years of education: None  . Highest education level: None  Social Needs  . Financial resource strain: None  . Food insecurity - worry: None  . Food insecurity - inability: None  . Transportation needs - medical: None  . Transportation needs - non-medical: None  Occupational History  . Occupation: Cleaning  Tobacco Use  . Smoking status: Former Smoker    Packs/day: 1.00    Years: 26.00    Pack years: 26.00    Types: Cigarettes    Last attempt to quit: 1989    Years since quitting: 30.1  . Smokeless tobacco: Never Used  Substance and Sexual Activity  . Alcohol use: No  . Drug use: No  . Sexual activity: Not Currently  Other Topics Concern  . None  Social History Narrative   Divorced for 38 years in 2016. 2 sons.  2 granddaughters.       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       Hobbies: watch tv-survivor, dancing with the stars, enjoy alone time. Best friend [name] patient of Dr. Yong Channel. Enjoys work, time on Teaching laboratory technician.    Allergies  Allergen Reactions  . Celexa [Citalopram Hydrobromide]  psychosis  . Cephalosporins     REACTION: tongue swelling  . Clarithromycin     REACTION: blisters in mouth  . Clarithromycin   . Doxycycline Hyclate     REACTION: nausea, vomiting  . Levofloxacin     REACTION: tongue swells  . Penicillins     REACTION: per patient causes rash,hives  . Zolpidem Tartrate     REACTION: difficulty with concentration   Family History  Problem Relation Age of Onset  . Heart disease Mother   . Hypertension Mother   . Diabetes Mother   . COPD Father   . Colon cancer Neg Hx      Past medical history, social, surgical and family history all reviewed in electronic medical record.  No pertanent information unless stated regarding to the chief complaint.     Review of Systems:Review of systems updated and as accurate as of 12/07/17  No headache, visual changes, nausea, vomiting, diarrhea, constipation, dizziness, abdominal pain, skin rash, fevers, chills, night sweats, weight loss, swollen lymph nodes, body aches, joint swelling, chest pain, shortness of breath, mood changes.  Positive muscle aches  Objective  Blood pressure (!) 160/78, pulse 71, height 5' (1.524 m), weight 161 lb (73 kg), SpO2 98 %. Systems examined below as of 12/07/17   General: No apparent distress alert and oriented x3 mood and affect normal, dressed appropriately.  HEENT: Pupils equal, extraocular movements intact  Respiratory: Patient's speak in full sentences and does not appear short of breath  Cardiovascular: No lower extremity edema, non tender, no erythema  Skin: Warm dry intact with no signs of infection or rash on extremities or on axial skeleton.  Abdomen: Soft nontender  Neuro: Cranial nerves II through XII are intact, neurovascularly intact in all extremities with 2+ DTRs and 2+ pulses.  Lymph: No lymphadenopathy of posterior or anterior cervical chain or axillae bilaterally.  Gait normal with good balance and coordination.  MSK:  Non tender with full range of motion and good stability and symmetric strength and tone of shoulders, elbows, wrist, hip, knee and ankles bilaterally.   Back Exam:  Inspection: Loss of lordosis. Motion: Flexion 40 deg, Extension 25 deg, Side Bending to 35 deg bilaterally,  Rotation to 40 deg bilaterally  SLR laying: Negative  XSLR laying: Negative  Palpable tenderness: Tender to palpation the paraspinal musculature.Marland Kitchen FABER: Positive Faber. Sensory change: Gross sensation intact to all lumbar and sacral dermatomes.  Reflexes: 2+ at both patellar tendons, 2+ at achilles tendons, Babinski's downgoing.  Strength at foot  Plantar-flexion: 5/5 Dorsi-flexion: 5/5 Eversion: 5/5 Inversion: 5/5  Leg strength  Quad: 5/5 Hamstring: 5/5  Hip flexor: 5/5 Hip abductors: 5/5  Gait unremarkable.  Osteopathic findings C2 flexed rotated and side bent right C4 flexed rotated and side bent left T3 extended rotated and side bent right inhaled third rib T7 extended rotated and side bent left L2 flexed rotated and side bent right Sacrum right on right     Impression and Recommendations:     This case required medical decision making of moderate complexity.      Note: This dictation was prepared with Dragon dictation along with smaller phrase technology. Any transcriptional errors that result from this process are unintentional.

## 2017-12-07 ENCOUNTER — Encounter: Payer: Self-pay | Admitting: Family Medicine

## 2017-12-07 ENCOUNTER — Ambulatory Visit: Payer: Medicare Other | Admitting: Family Medicine

## 2017-12-07 VITALS — BP 150/78 | HR 71 | Ht 60.0 in | Wt 161.0 lb

## 2017-12-07 DIAGNOSIS — M533 Sacrococcygeal disorders, not elsewhere classified: Secondary | ICD-10-CM

## 2017-12-07 DIAGNOSIS — M999 Biomechanical lesion, unspecified: Secondary | ICD-10-CM | POA: Diagnosis not present

## 2017-12-07 NOTE — Assessment & Plan Note (Signed)
Stable.  Discussed icing regimen and home exercises.  Discussed which activities to do which wants to avoid.  Discussed hip abductor exercises.  Return to clinic in 6 weeks

## 2017-12-07 NOTE — Assessment & Plan Note (Signed)
Decision today to treat with OMT was based on Physical Exam  After verbal consent patient was treated with HVLA, ME, FPR techniques in cervical, thoracic, rib, lumbar and sacral areas  Patient tolerated the procedure well with improvement in symptoms  Patient given exercises, stretches and lifestyle modifications  See medications in patient instructions if given  Patient will follow up in 6 weeks 

## 2017-12-07 NOTE — Patient Instructions (Signed)
Good to see you  Wall now 2:30 min Keep it up otherwise Love the iron  See me again in 6 weeks

## 2017-12-10 ENCOUNTER — Ambulatory Visit: Payer: Medicare Other | Admitting: Cardiology

## 2017-12-13 ENCOUNTER — Ambulatory Visit: Payer: Self-pay | Admitting: Neurology

## 2017-12-28 ENCOUNTER — Telehealth: Payer: Self-pay | Admitting: *Deleted

## 2017-12-28 ENCOUNTER — Other Ambulatory Visit: Payer: Medicare Other

## 2017-12-28 DIAGNOSIS — I10 Essential (primary) hypertension: Secondary | ICD-10-CM

## 2017-12-28 DIAGNOSIS — E782 Mixed hyperlipidemia: Secondary | ICD-10-CM | POA: Diagnosis not present

## 2017-12-28 DIAGNOSIS — I2510 Atherosclerotic heart disease of native coronary artery without angina pectoris: Secondary | ICD-10-CM

## 2017-12-28 LAB — COMPREHENSIVE METABOLIC PANEL
ALT: 13 IU/L (ref 0–32)
AST: 15 IU/L (ref 0–40)
Albumin/Globulin Ratio: 1.7 (ref 1.2–2.2)
Albumin: 4.5 g/dL (ref 3.5–4.8)
Alkaline Phosphatase: 97 IU/L (ref 39–117)
BUN/Creatinine Ratio: 23 (ref 12–28)
BUN: 20 mg/dL (ref 8–27)
Bilirubin Total: 0.4 mg/dL (ref 0.0–1.2)
CO2: 25 mmol/L (ref 20–29)
Calcium: 9.4 mg/dL (ref 8.7–10.3)
Chloride: 102 mmol/L (ref 96–106)
Creatinine, Ser: 0.87 mg/dL (ref 0.57–1.00)
GFR calc Af Amer: 78 mL/min/{1.73_m2} (ref >59)
GFR calc non Af Amer: 67 mL/min/{1.73_m2} (ref >59)
Globulin, Total: 2.6 g/dL (ref 1.5–4.5)
Glucose: 123 mg/dL — ABNORMAL HIGH (ref 65–99)
Potassium: 4.7 mmol/L (ref 3.5–5.2)
Sodium: 143 mmol/L (ref 134–144)
Total Protein: 7.1 g/dL (ref 6.0–8.5)

## 2017-12-28 LAB — LIPID PANEL
Chol/HDL Ratio: 3.7 ratio (ref 0.0–4.4)
Cholesterol, Total: 145 mg/dL (ref 100–199)
HDL: 39 mg/dL — ABNORMAL LOW (ref >39)
LDL Calculated: 70 mg/dL (ref 0–99)
Triglycerides: 179 mg/dL — ABNORMAL HIGH (ref 0–149)
VLDL Cholesterol Cal: 36 mg/dL (ref 5–40)

## 2017-12-28 MED ORDER — FISH OIL 1000 MG PO CAPS: 1000.0000 mg | ORAL_CAPSULE | Freq: Every day | ORAL | 2 refills | Status: DC

## 2017-12-28 NOTE — Telephone Encounter (Signed)
-----   Message from Dorothy Spark, MD sent at 12/28/2017  4:29 PM EST ----- Normal Crea, LFTs, excellent LDL, elevated TG, add fish oil 1000 mg po daily

## 2017-12-28 NOTE — Telephone Encounter (Signed)
Spoke with the pt and informed her that per Dr Meda Coffee, her labs showed normal creatinine, LFTs, excellent LDL, elevated TG, and she recommends that we add fish oil 1000 mg po daily to her regimen.  Confirmed the pharmacy of choice with the pt.  Pt verbalized understanding and agrees with this plan.

## 2018-01-03 ENCOUNTER — Other Ambulatory Visit: Payer: Self-pay | Admitting: Family Medicine

## 2018-01-03 ENCOUNTER — Telehealth: Payer: Self-pay | Admitting: Cardiology

## 2018-01-03 DIAGNOSIS — I25110 Atherosclerotic heart disease of native coronary artery with unstable angina pectoris: Secondary | ICD-10-CM

## 2018-01-03 MED ORDER — EZETIMIBE 10 MG PO TABS: 10.0000 mg | ORAL_TABLET | Freq: Every day | ORAL | 1 refills | Status: DC

## 2018-01-03 NOTE — Telephone Encounter (Signed)
Thank you Jinny Blossom! Ivy, please start her on zetia 10 mg po daily. Repeat liids in 1 month

## 2018-01-03 NOTE — Telephone Encounter (Signed)
Megan, Could you help with this patient? She is intolerant to pravastatin and rosuvastatin, she is post PCI for NSTEMI. Thank you, K

## 2018-01-03 NOTE — Telephone Encounter (Signed)
Pt thinks she is having a reaction from her Crestor, with her legs and joints [phone] pls advise

## 2018-01-03 NOTE — Telephone Encounter (Signed)
Spoke with the pt and informed her that Dr Meda Coffee recommends that she stop taking rosuvastatin, and switch to new regimen Zetia 10 mg po daily, and have repeat lipids x 1 month.  Confirmed the pharmacy of choice with the pt.  Scheduled the pt repeat lipids in one month for 02/03/18.  Pt verbalized understanding and agrees with this plan.

## 2018-01-03 NOTE — Telephone Encounter (Signed)
Pt calling in with complaints of bilateral leg pain/cramps, since starting rosuvastatin.  Pt would like for you to advise on a regimen switch, or what else she can do, to help with her leg pain.  Please advise.

## 2018-01-03 NOTE — Telephone Encounter (Signed)
Pt has previously tried rosuvastatin 3x per week, pravastatin 20mg  daily, and atorvastatin 80mg  daily but has experienced myalgias with all. Most recent lipids showed LDL at goal of 70, however she was taking low dose Crestor at that time. Could try Zetia 10mg  daily, Livalo 1mg  daily, or see if pt would like to discuss injectable PCSK9i therapy in clinic. She would need a statin washout to obtain baseline lipids before insurance would approve her for PCSK9i therapy.

## 2018-01-04 ENCOUNTER — Other Ambulatory Visit: Payer: Self-pay

## 2018-01-04 DIAGNOSIS — E559 Vitamin D deficiency, unspecified: Secondary | ICD-10-CM

## 2018-01-04 NOTE — Telephone Encounter (Signed)
Called patient and scheduled her for a Vit D level on 01/06/18

## 2018-01-04 NOTE — Telephone Encounter (Signed)
Lets check vitamin D level first. Please set her up for this under vitamin D deficiency

## 2018-01-06 ENCOUNTER — Other Ambulatory Visit (INDEPENDENT_AMBULATORY_CARE_PROVIDER_SITE_OTHER): Payer: Medicare Other

## 2018-01-06 DIAGNOSIS — E559 Vitamin D deficiency, unspecified: Secondary | ICD-10-CM | POA: Diagnosis not present

## 2018-01-06 LAB — VITAMIN D 25 HYDROXY (VIT D DEFICIENCY, FRACTURES): VITD: 58.99 ng/mL (ref 30.00–100.00)

## 2018-01-10 ENCOUNTER — Telehealth: Payer: Self-pay | Admitting: Family Medicine

## 2018-01-10 NOTE — Telephone Encounter (Signed)
See note.   Copied from [place]. Topic: Quick Communication - Lab Results >> Jan 10, 2018  2:48 PM [place], [place], [place] wrote: Called patient to inform them of 01/07/2018 lab results. When patient returns call, triage nurse may disclose results. >> Jan 10, 2018  2:54 PM Margot Ables wrote: Pt wanted to notify she has reviewed Vit D results and instructions but couldn't reply bc she is out.

## 2018-01-10 NOTE — Telephone Encounter (Signed)
Patient's lab results came back great. Patient can maintain with an OTC dosage

## 2018-01-10 NOTE — Telephone Encounter (Signed)
noted 

## 2018-01-19 ENCOUNTER — Encounter: Payer: Self-pay | Admitting: Family Medicine

## 2018-01-19 ENCOUNTER — Ambulatory Visit: Payer: Medicare Other | Admitting: Family Medicine

## 2018-01-19 VITALS — BP 150/80 | HR 72 | Ht 60.0 in | Wt 161.0 lb

## 2018-01-19 DIAGNOSIS — G894 Chronic pain syndrome: Secondary | ICD-10-CM

## 2018-01-19 DIAGNOSIS — M999 Biomechanical lesion, unspecified: Secondary | ICD-10-CM

## 2018-01-19 NOTE — Assessment & Plan Note (Signed)
Decision today to treat with OMT was based on Physical Exam  After verbal consent patient was treated with HVLA, ME, FPR techniques in cervical, thoracic, rib, lumbar and sacral areas  Patient tolerated the procedure well with improvement in symptoms  Patient given exercises, stretches and lifestyle modifications  See medications in patient instructions if given  Patient will follow up in 4 weeks 

## 2018-01-19 NOTE — Progress Notes (Signed)
[name] [place], [place] [address] Phone: [phone] Subjective:   CC: Back pain  POE:[id number]  Felicia Perry is a 72 y.o. female coming in with complaint of back pain.  Patient has had this for quite some time.  Was responding well to osteopathic manipulation.  Patient also seem to be responding well from chronic pain as well as with the Effexor.  Unfortunately worsening symptoms at this time.  Affecting daily activities.  Feels like she has increasing soreness.  Relates some of this to a new medication the patient took.      Past Medical History:  Diagnosis Date  . Adenomatous polyp 11/23/2005  . Anxiety   . Chronic diastolic CHF (congestive heart failure) ([facility])   . Coronary artery disease    a. HC on 08/14/16 showed RCA CTO s/p PCI, 60% LCX disease managed medically with recs to consider staged PCI if continued symptoms.   Marland Kitchen DIVERTICULOSIS, COLON 04/22/2007       . GERD (gastroesophageal reflux disease)   . History of shingles   . Hypertension   . Hypertensive heart disease   . Internal hemorrhoids   . Osteoarthritis    "knees, hands, lower back" (08/14/2016)  . PUD (peptic ulcer disease)   . Sleep apnea    a. resolved with weight loss   Past Surgical History:  Procedure Laterality Date  . BREAST SURGERY Left 1980s   Fibrous Tumors removed   . CARDIAC CATHETERIZATION N/A 08/14/2016   Procedure: Left Heart Cath and Coronary Angiography;  Surgeon: Sherren Mocha, MD;  Location: [facility];  Service: Cardiovascular;  Laterality: N/A;  . CARDIAC CATHETERIZATION N/A 08/14/2016   Procedure: Coronary Stent Intervention;  Surgeon: Sherren Mocha, MD;  Location: [facility];  Service: Cardiovascular;  Laterality: N/A;  . CATARACT EXTRACTION W/ INTRAOCULAR LENS  IMPLANT, BILATERAL Bilateral 05/2009  . CORONARY ANGIOPLASTY    . TUBAL LIGATION  1970s   Social History   Socioeconomic History  . Marital status:  Divorced    Spouse name: Not on file  . Number of children: Not on file  . Years of education: Not on file  . Highest education level: Not on file  Occupational History  . Occupation: Education administrator  Social Needs  . Financial resource strain: Not on file  . Food insecurity:    Worry: Not on file    Inability: Not on file  . Transportation needs:    Medical: Not on file    Non-medical: Not on file  Tobacco Use  . Smoking status: Former Smoker    Packs/day: 1.00    Years: 26.00    Pack years: 26.00    Types: Cigarettes    Last attempt to quit: 1989    Years since quitting: 30.2  . Smokeless tobacco: Never Used  Substance and Sexual Activity  . Alcohol use: No  . Drug use: No  . Sexual activity: Not Currently  Lifestyle  . Physical activity:    Days per week: Not on file    Minutes per session: Not on file  . Stress: Not on file  Relationships  . Social connections:    Talks on phone: Not on file    Gets together: Not on file    Attends religious service: Not on file    Active member of club or organization: Not on file    Attends meetings of clubs or organizations: Not on file  Relationship status: Not on file  Other Topics Concern  . Not on file  Social History Narrative   Divorced for 38 years in 2016. 2 sons.  2 granddaughters.       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       Hobbies: watch tv-survivor, dancing with the stars, enjoy alone time. Best friend [name] patient of Dr. Yong Channel. Enjoys work, time on Teaching laboratory technician.    Allergies  Allergen Reactions  . Celexa [Citalopram Hydrobromide]     psychosis  . Cephalosporins     REACTION: tongue swelling  . Clarithromycin     REACTION: blisters in mouth  . Clarithromycin   . Doxycycline Hyclate     REACTION: nausea, vomiting  . Levofloxacin     REACTION: tongue swells  . Penicillins     REACTION: per patient causes rash,hives  . Rosuvastatin Other (See Comments)     Pt reports causes bilateral lower extremity muscle aches.   . Zolpidem Tartrate     REACTION: difficulty with concentration   Family History  Problem Relation Age of Onset  . Heart disease Mother   . Hypertension Mother   . Diabetes Mother   . COPD Father   . Colon cancer Neg Hx      Past medical history, social, surgical and family history all reviewed in electronic medical record.  No pertanent information unless stated regarding to the chief complaint.   Review of Systems:Review of systems updated and as accurate as of 01/19/18  No headache, visual changes, nausea, vomiting, diarrhea, constipation, dizziness, abdominal pain, skin rash, fevers, chills, night sweats, weight loss, swollen lymph nodes, body aches, joint swelling, chest pain, shortness of breath, mood changes.  Positive muscle aches  Objective  Blood pressure (!) 150/80, pulse 72, height 5' (1.524 m), weight 161 lb (73 kg), SpO2 98 %. Systems examined below as of 01/19/18   General: No apparent distress alert and oriented x3 mood and affect normal, dressed appropriately.  HEENT: Pupils equal, extraocular movements intact  Respiratory: Patient's speak in full sentences and does not appear short of breath  Cardiovascular: No lower extremity edema, non tender, no erythema  Skin: Warm dry intact with no signs of infection or rash on extremities or on axial skeleton.  Abdomen: Soft nontender  Neuro: Cranial nerves II through XII are intact, neurovascularly intact in all extremities with 2+ DTRs and 2+ pulses.  Lymph: No lymphadenopathy of posterior or anterior cervical chain or axillae bilaterally.  Gait normal with good balance and coordination.  MSK:  Non tender with full range of motion and good stability and symmetric strength and tone of shoulders, elbows, wrist, hip, knee and ankles bilaterally.  Arthritic changes noted.  Patient some low back pain seems to be somewhat tighter than usual.  More on the left side.   Increasing tenderness noted on the left sacroiliac joint.  Positive Faber test.  Severe tenderness to palpation in the paraspinal musculature lumbar spine.  Negative straight leg test  Osteopathic findings C2 flexed rotated and side bent right C4 flexed rotated and side bent left C7 flexed rotated and side bent left T3 extended rotated and side bent right inhaled third rib T6 extended rotated and side bent left L2 flexed rotated and side bent right Sacrum right on right     Impression and Recommendations:     This case required medical decision making of moderate complexity.      Note:  This dictation was prepared with Dragon dictation along with smaller phrase technology. Any transcriptional errors that result from this process are unintentional.

## 2018-01-19 NOTE — Assessment & Plan Note (Signed)
I believe the patient seems to be having worsening symptoms at this time.  We may need to consider changing the Effexor.  Patient has had many other health problems recently.  Possible laboratory workup will be needed in the future.  Discussed continuing conservative therapy otherwise.  Follow-up again in 4 weeks

## 2018-01-19 NOTE — Patient Instructions (Signed)
I am sorry you are hurting so much  CoQ10 200-400mg  daily should help with some of the muscle soreness.  Continue with the exercises  Call your doc and see if they can do a medication reconciliation.  See me again in 4 weeks.

## 2018-02-01 ENCOUNTER — Encounter: Payer: Self-pay | Admitting: Family Medicine

## 2018-02-01 ENCOUNTER — Ambulatory Visit: Payer: Medicare Other | Admitting: Family Medicine

## 2018-02-01 VITALS — BP 116/82 | HR 64 | Temp 98.1°F | Ht 60.0 in | Wt 158.4 lb

## 2018-02-01 DIAGNOSIS — I10 Essential (primary) hypertension: Secondary | ICD-10-CM

## 2018-02-01 DIAGNOSIS — R55 Syncope and collapse: Secondary | ICD-10-CM

## 2018-02-01 DIAGNOSIS — J4540 Moderate persistent asthma, uncomplicated: Secondary | ICD-10-CM

## 2018-02-01 NOTE — Assessment & Plan Note (Signed)
S: controlled on metoprolol 50mg  XR, irbesartan 36m, amlodipine 5mg . See prior notes for SE of other medicines. No lasix in months BP Readings from Last 3 Encounters:  02/01/18 116/82  01/19/18 (!) 150/80  12/07/17 (!) 150/78  A/P: We discussed blood pressure goal of <140/90. Continue current meds

## 2018-02-01 NOTE — Assessment & Plan Note (Signed)
S: syncopal episode last visit and advised cardiology follow up. Dr. Tamala Julian of sports medicine did carotid duplex which showed 40-59% blockage. Referred to neurology as well given rhythmic movement of arm during one episode. Advised no driving until cardiology and neurology released her. Each of these episodes was after trying to hold her breath instead of coughing during asthma flare up- since her asthma has improved and she has avoided this breath holding she has had no further episodes.   She saw cardiology 2 months later and patient described episodes more as spells than full syncope. Appears she called to cancel neurology referral. Patients story today is that she discussed syncope with Dr. Meda Coffee who advised her since no more episodes that she did not need further cardiac or neurological testing.  A/P: Given no recurrence of "spells" with  Asthma in better shape and not holding breath during coughing spells- I think we can safely discontinue neurology follow up plan. She is well aware if any recurrence- no driving and we would have to get her back into cardiology and neurology

## 2018-02-01 NOTE — Patient Instructions (Addendum)
Health Maintenance Due  Topic Date Due  . COLONOSCOPY -Needs to schedule with Dr. Fuller Plan. Please give them a call 03/31/2016   Schedule next available physical with me- perhaps 4 months  You can probably get in for a wellness visit with Cassie before physical with me as well. So I would also like for you to sign up for an annual wellness visit with one of our nurses, Cassie or Manuela Schwartz, who both specialize in the annual wellness visit. This is a free benefit under medicare that may help [place] find additional ways to help you. Some highlights are reviewing medications, lifestyle, and doing a dementia screen.   No changes

## 2018-02-01 NOTE — Assessment & Plan Note (Signed)
S: last visit poor control of ashma- gave her samples of symbicort last visit. After starting use she realized how drastically she needed it. Rare coughing spells at this point. Not having to use albuterol.  A/P: much improved- continue current dose of medicine. Given how poorly she did with poorly controlled asthma with coughing spells and associated "spells" as per syncope section- may let dose stay stable instead of focusing on step down therapy

## 2018-02-01 NOTE — Progress Notes (Signed)
Subjective:  Felicia Perry is a 72 y.o. year old very pleasant female patient who presents for/with See problem oriented charting ROS- no chest pain. No syncope. No fever or chills. Cough much improved.    Past Medical History-  Patient Active Problem List   Diagnosis Date Noted  . Coronary artery disease     Priority: High  . Syncope 08/04/2016    Priority: High  . H/O hyperparathyroidism 08/18/2016    Priority: Medium  . Vitamin D deficiency 08/20/2015    Priority: Medium  . Chronic pain syndrome 03/26/2015    Priority: Medium  . Insomnia 07/13/2014    Priority: Medium  . GERD (gastroesophageal reflux disease) 02/25/2011    Priority: Medium  . Depression 04/22/2007    Priority: Medium  . Essential hypertension 04/22/2007    Priority: Medium  . Asthma 04/22/2007    Priority: Medium  . Hyperglycemia 08/06/2015    Priority: Low  . Xerosis of skin 08/06/2015    Priority: Low  . Hypersomnia with sleep apnea 03/26/2015    Priority: Low  . Nonallopathic lesion of lumbosacral region 11/13/2014    Priority: Low  . Osteoarthritis of left lower extremity 10/17/2014    Priority: Low  . Chronic meniscal tear of knee 10/17/2014    Priority: Low  . Nonallopathic lesion of thoracic region 09/14/2014    Priority: Low  . Nonallopathic lesion-rib cage 08/21/2014    Priority: Low  . Tendinopathy of right rotator cuff 07/30/2014    Priority: Low  . Piriformis syndrome of right side 07/30/2014    Priority: Low  . Limited joint range of motion 07/13/2014    Priority: Low  . Rash 07/13/2014    Priority: Low  . SI (sacroiliac) joint dysfunction 10/10/2007    Priority: Low  . LOW BACK PAIN 07/01/2007    Priority: Low  . Degenerative arthritis of left knee 09/15/2017  . Acute pericarditis 12/21/2016  . Nondisplaced fracture of first metatarsal bone, right foot, initial encounter for closed fracture 11/19/2016  . DOE (dyspnea on exertion) 09/02/2016  . PUD (peptic ulcer disease)    . Enlarged lymph nodes 05/19/2016    Medications- reviewed and updated Current Outpatient Medications  Medication Sig Dispense Refill  . co-enzyme Q-10 30 MG capsule Take 50 mg by mouth daily.    Marland Kitchen acetaminophen (TYLENOL) 325 MG tablet Take 650 mg by mouth every 6 (six) hours as needed.    Marland Kitchen albuterol (PROVENTIL HFA;VENTOLIN HFA) 108 (90 Base) MCG/ACT inhaler Inhale 2 puffs into the lungs every 6 (six) hours as needed for wheezing or shortness of breath. 1 Inhaler 5  . ALPRAZolam (XANAX) 1 MG tablet Take 1 mg by mouth at bedtime as needed for sleep.    Marland Kitchen amLODipine (NORVASC) 10 MG tablet Take 10 mg by mouth daily.    Marland Kitchen aspirin EC 81 MG tablet Take 1 tablet (81 mg total) by mouth daily. 90 tablet 3  . budesonide-formoterol (SYMBICORT) 160-4.5 MCG/ACT inhaler Inhale 2 puffs into the lungs 2 (two) times daily. 1 Inhaler 3  . ezetimibe (ZETIA) 10 MG tablet Take 1 tablet (10 mg total) by mouth daily. 30 tablet 1  . Ferrous Sulfate (IRON) 325 (65 Fe) MG TABS Take 325 mg by mouth daily.    . furosemide (LASIX) 20 MG tablet Take 20 mg by mouth daily for one week, then take 20 mg by mouth daily as needed for lower extremity swelling thereafter. 30 tablet 6  . irbesartan (AVAPRO) 300 MG  tablet Take 1 tablet (300 mg total) by mouth daily. 90 tablet 3  . levocetirizine (XYZAL) 5 MG tablet Take 5 mg by mouth every morning.     . metoprolol succinate (TOPROL-XL) 50 MG 24 hr tablet Take 50 mg by mouth daily. Take with or immediately following a meal.    . nitroGLYCERIN (NITROSTAT) 0.4 MG SL tablet Place 1 tablet (0.4 mg total) under the tongue every 5 (five) minutes as needed for chest pain. 25 tablet 6  . Omega-3 Fatty Acids (FISH OIL) 1000 MG CAPS Take 1 capsule (1,000 mg total) by mouth daily. 90 capsule 2  . pantoprazole (PROTONIX) 40 MG tablet Take 1 tablet (40 mg total) by mouth daily. 30 tablet 3  . potassium chloride (K-DUR) 10 MEQ tablet Take 10 mEq by mouth daily.    Marland Kitchen venlafaxine XR (EFFEXOR-XR)  75 MG 24 hr capsule Take 75 mg by mouth daily with breakfast.    . Vitamin D, Ergocalciferol, (DRISDOL) 50000 units CAPS capsule Take 2,000 Units by mouth daily.      No current facility-administered medications for this visit.     Objective: BP 116/82 (BP Location: Left Arm, Patient Position: Sitting, Cuff Size: Normal)   Pulse 64   Temp 98.1 F (36.7 C) (Oral)   Ht 5' (1.524 m)   Wt 158 lb 6.4 oz (71.8 kg)   SpO2 98%   BMI 30.94 kg/m  Gen: NAD, resting comfortably CV: RRR no murmurs rubs or gallops Lungs: CTAB no crackles, wheeze, rhonchi Abdomen: soft/nontender/overweight Ext: no edema Skin: warm, dry  Assessment/Plan:  Essential hypertension S: controlled on metoprolol 50mg  XR, irbesartan 321m, amlodipine 5mg . See prior notes for SE of other medicines. No lasix in months BP Readings from Last 3 Encounters:  02/01/18 116/82  01/19/18 (!) 150/80  12/07/17 (!) 150/78  A/P: We discussed blood pressure goal of <140/90. Continue current meds  Syncope S: syncopal episode last visit and advised cardiology follow up. Dr. Tamala Julian of sports medicine did carotid duplex which showed 40-59% blockage. Referred to neurology as well given rhythmic movement of arm during one episode. Advised no driving until cardiology and neurology released her. Each of these episodes was after trying to hold her breath instead of coughing during asthma flare up- since her asthma has improved and she has avoided this breath holding she has had no further episodes.   She saw cardiology 2 months later and patient described episodes more as spells than full syncope. Appears she called to cancel neurology referral. Patients story today is that she discussed syncope with Dr. Meda Coffee who advised her since no more episodes that she did not need further cardiac or neurological testing.  A/P: Given no recurrence of "spells" with  Asthma in better shape and not holding breath during coughing spells- I think we can safely  discontinue neurology follow up plan. She is well aware if any recurrence- no driving and we would have to get her back into cardiology and neurology  Asthma S: last visit poor control of ashma- gave her samples of symbicort last visit. After starting use she realized how drastically she needed it. Rare coughing spells at this point. Not having to use albuterol.  A/P: much improved- continue current dose of medicine. Given how poorly she did with poorly controlled asthma with coughing spells and associated "spells" as per syncope section- may let dose stay stable instead of focusing on step down therapy   Future Appointments  Date Time Provider [place]  02/03/2018  8:15 AM [facility]  02/16/2018  3:30 PM [name], DO [facility]  03/17/2018  8:00 AM Williemae Area, RN [facility]  06/03/2018  3:30 PM Yong Channel Brayton Mars, MD [facility]  scheduled follow up  Return precautions advised.  Garret Reddish, MD

## 2018-02-03 ENCOUNTER — Other Ambulatory Visit: Payer: Medicare Other | Admitting: *Deleted

## 2018-02-03 DIAGNOSIS — I25110 Atherosclerotic heart disease of native coronary artery with unstable angina pectoris: Secondary | ICD-10-CM | POA: Diagnosis not present

## 2018-02-03 LAB — LIPID PANEL
Chol/HDL Ratio: 3.8 ratio (ref 0.0–4.4)
Cholesterol, Total: 145 mg/dL (ref 100–199)
HDL: 38 mg/dL — ABNORMAL LOW (ref >39)
LDL Calculated: 71 mg/dL (ref 0–99)
Triglycerides: 182 mg/dL — ABNORMAL HIGH (ref 0–149)
VLDL Cholesterol Cal: 36 mg/dL (ref 5–40)

## 2018-02-14 ENCOUNTER — Other Ambulatory Visit: Payer: Self-pay | Admitting: Family Medicine

## 2018-02-15 NOTE — Telephone Encounter (Signed)
Refill done.  

## 2018-02-15 NOTE — Progress Notes (Deleted)
[name] [place], [place] [address] Phone: [phone] Subjective:    I'm seeing this patient by the request  of:    CC:   [id number]  Sophira H Whack is a 72 y.o. female coming in with complaint of ***  Onset-  Location Duration-  Character- Aggravating factors- Reliving factors-  Therapies tried-  Severity-     Past Medical History:  Diagnosis Date  . Adenomatous polyp 11/23/2005  . Anxiety   . Chronic diastolic CHF (congestive heart failure) ([place])   . Coronary artery disease    a. HC on 08/14/16 showed RCA CTO s/p PCI, 60% LCX disease managed medically with recs to consider staged PCI if continued symptoms.   Marland Kitchen DIVERTICULOSIS, COLON 04/22/2007       . GERD (gastroesophageal reflux disease)   . History of shingles   . Hypertension   . Hypertensive heart disease   . Internal hemorrhoids   . Osteoarthritis    "knees, hands, lower back" (08/14/2016)  . PUD (peptic ulcer disease)   . Sleep apnea    a. resolved with weight loss   Past Surgical History:  Procedure Laterality Date  . BREAST SURGERY Left 1980s   Fibrous Tumors removed   . CARDIAC CATHETERIZATION N/A 08/14/2016   Procedure: Left Heart Cath and Coronary Angiography;  Surgeon: Sherren Mocha, MD;  Location: [facility];  Service: Cardiovascular;  Laterality: N/A;  . CARDIAC CATHETERIZATION N/A 08/14/2016   Procedure: Coronary Stent Intervention;  Surgeon: Sherren Mocha, MD;  Location: [facility];  Service: Cardiovascular;  Laterality: N/A;  . CATARACT EXTRACTION W/ INTRAOCULAR LENS  IMPLANT, BILATERAL Bilateral 05/2009  . CORONARY ANGIOPLASTY    . TUBAL LIGATION  1970s   Social History   Socioeconomic History  . Marital status: Divorced    Spouse name: Not on file  . Number of children: Not on file  . Years of education: Not on file  . Highest education level: Not on file  Occupational History  . Occupation: Education administrator  Social Needs   . Financial resource strain: Not on file  . Food insecurity:    Worry: Not on file    Inability: Not on file  . Transportation needs:    Medical: Not on file    Non-medical: Not on file  Tobacco Use  . Smoking status: Former Smoker    Packs/day: 1.00    Years: 26.00    Pack years: 26.00    Types: Cigarettes    Last attempt to quit: 1989    Years since quitting: 30.3  . Smokeless tobacco: Never Used  Substance and Sexual Activity  . Alcohol use: No  . Drug use: No  . Sexual activity: Not Currently  Lifestyle  . Physical activity:    Days per week: Not on file    Minutes per session: Not on file  . Stress: Not on file  Relationships  . Social connections:    Talks on phone: Not on file    Gets together: Not on file    Attends religious service: Not on file    Active member of club or organization: Not on file    Attends meetings of clubs or organizations: Not on file    Relationship status: Not on file  Other Topics Concern  . Not on file  Social History Narrative   Divorced for 38 years in 2016. 2 sons.  2 granddaughters.  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       Hobbies: watch tv-survivor, dancing with the stars, enjoy alone time. Best friend [name] patient of Dr. Yong Channel. Enjoys work, time on Teaching laboratory technician.    Allergies  Allergen Reactions  . Celexa [Citalopram Hydrobromide]     psychosis  . Cephalosporins     REACTION: tongue swelling  . Clarithromycin     REACTION: blisters in mouth  . Clarithromycin   . Doxycycline Hyclate     REACTION: nausea, vomiting  . Levofloxacin     REACTION: tongue swells  . Penicillins     REACTION: per patient causes rash,hives  . Rosuvastatin Other (See Comments)    Pt reports causes bilateral lower extremity muscle aches.   . Zolpidem Tartrate     REACTION: difficulty with concentration   Family History  Problem Relation Age of Onset  . Heart disease Mother   .  Hypertension Mother   . Diabetes Mother   . COPD Father   . Colon cancer Neg Hx      Past medical history, social, surgical and family history all reviewed in electronic medical record.  No pertanent information unless stated regarding to the chief complaint.   Review of Systems:Review of systems updated and as accurate as of 02/15/18  No headache, visual changes, nausea, vomiting, diarrhea, constipation, dizziness, abdominal pain, skin rash, fevers, chills, night sweats, weight loss, swollen lymph nodes, body aches, joint swelling, muscle aches, chest pain, shortness of breath, mood changes.   Objective  There were no vitals taken for this visit. Systems examined below as of 02/15/18   General: No apparent distress alert and oriented x3 mood and affect normal, dressed appropriately.  HEENT: Pupils equal, extraocular movements intact  Respiratory: Patient's speak in full sentences and does not appear short of breath  Cardiovascular: No lower extremity edema, non tender, no erythema  Skin: Warm dry intact with no signs of infection or rash on extremities or on axial skeleton.  Abdomen: Soft nontender  Neuro: Cranial nerves II through XII are intact, neurovascularly intact in all extremities with 2+ DTRs and 2+ pulses.  Lymph: No lymphadenopathy of posterior or anterior cervical chain or axillae bilaterally.  Gait normal with good balance and coordination.  MSK:  Non tender with full range of motion and good stability and symmetric strength and tone of shoulders, elbows, wrist, hip, knee and ankles bilaterally.     Impression and Recommendations:     This case required medical decision making of moderate complexity.      Note: This dictation was prepared with Dragon dictation along with smaller phrase technology. Any transcriptional errors that result from this process are unintentional.

## 2018-02-16 ENCOUNTER — Ambulatory Visit: Payer: Medicare Other | Admitting: Family Medicine

## 2018-03-08 NOTE — Progress Notes (Signed)
[facility] [place], [place] [address] Phone: [phone] Subjective:    I'm seeing this patient by the request  of:    CC: Back pain follow-up  BJS:[id number]  Felicia Perry is a 72 y.o. female coming in with complaint of back pain.  Has had more muscle instability and does have some some degenerative disc disease.  Patient has responded well to manipulation previously.  Patient states she is feeling a little bit better.  Was having exacerbation previously.  Still has pain in the left buttocks from time to time.  Not debilitating.  Feels that icing as well as Epson salt baths seem to help.  Patient denies any significant radiation down the legs at this moment.      Past Medical History:  Diagnosis Date  . Adenomatous polyp 11/23/2005  . Anxiety   . Chronic diastolic CHF (congestive heart failure) ([place])   . Coronary artery disease    a. HC on 08/14/16 showed RCA CTO s/p PCI, 60% LCX disease managed medically with recs to consider staged PCI if continued symptoms.   Marland Kitchen DIVERTICULOSIS, COLON 04/22/2007       . GERD (gastroesophageal reflux disease)   . History of shingles   . Hypertension   . Hypertensive heart disease   . Internal hemorrhoids   . Osteoarthritis    "knees, hands, lower back" (08/14/2016)  . PUD (peptic ulcer disease)   . Sleep apnea    a. resolved with weight loss   Past Surgical History:  Procedure Laterality Date  . BREAST SURGERY Left 1980s   Fibrous Tumors removed   . CARDIAC CATHETERIZATION N/A 08/14/2016   Procedure: Left Heart Cath and Coronary Angiography;  Surgeon: Sherren Mocha, MD;  Location: [facility];  Service: Cardiovascular;  Laterality: N/A;  . CARDIAC CATHETERIZATION N/A 08/14/2016   Procedure: Coronary Stent Intervention;  Surgeon: Sherren Mocha, MD;  Location: [facility];  Service: Cardiovascular;  Laterality: N/A;  . CATARACT EXTRACTION W/ INTRAOCULAR LENS  IMPLANT, BILATERAL  Bilateral 05/2009  . CORONARY ANGIOPLASTY    . TUBAL LIGATION  1970s   Social History   Socioeconomic History  . Marital status: Divorced    Spouse name: Not on file  . Number of children: Not on file  . Years of education: Not on file  . Highest education level: Not on file  Occupational History  . Occupation: Education administrator  Social Needs  . Financial resource strain: Not on file  . Food insecurity:    Worry: Not on file    Inability: Not on file  . Transportation needs:    Medical: Not on file    Non-medical: Not on file  Tobacco Use  . Smoking status: Former Smoker    Packs/day: 1.00    Years: 26.00    Pack years: 26.00    Types: Cigarettes    Last attempt to quit: 1989    Years since quitting: 30.3  . Smokeless tobacco: Never Used  Substance and Sexual Activity  . Alcohol use: No  . Drug use: No  . Sexual activity: Not Currently  Lifestyle  . Physical activity:    Days per week: Not on file    Minutes per session: Not on file  . Stress: Not on file  Relationships  . Social connections:    Talks on phone: Not on file    Gets together: Not on file    Attends religious service: Not on  file    Active member of club or organization: Not on file    Attends meetings of clubs or organizations: Not on file    Relationship status: Not on file  Other Topics Concern  . Not on file  Social History Narrative   Divorced for 38 years in 2016. 2 sons.  2 granddaughters.       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       Hobbies: watch tv-survivor, dancing with the stars, enjoy alone time. Best friend [name] patient of Dr. Yong Channel. Enjoys work, time on Teaching laboratory technician.    Allergies  Allergen Reactions  . Celexa [Citalopram Hydrobromide]     psychosis  . Cephalosporins     REACTION: tongue swelling  . Clarithromycin     REACTION: blisters in mouth  . Clarithromycin   . Doxycycline Hyclate     REACTION: nausea, vomiting  .  Levofloxacin     REACTION: tongue swells  . Penicillins     REACTION: per patient causes rash,hives  . Rosuvastatin Other (See Comments)    Pt reports causes bilateral lower extremity muscle aches.   . Zolpidem Tartrate     REACTION: difficulty with concentration   Family History  Problem Relation Age of Onset  . Heart disease Mother   . Hypertension Mother   . Diabetes Mother   . COPD Father   . Colon cancer Neg Hx      Past medical history, social, surgical and family history all reviewed in electronic medical record.  No pertanent information unless stated regarding to the chief complaint.   Review of Systems:Review of systems updated and as accurate as of 03/09/18  No headache, visual changes, nausea, vomiting, diarrhea, constipation, dizziness, abdominal pain, skin rash, fevers, chills, night sweats, weight loss, swollen lymph nodes, body aches, joint swelling, chest pain, shortness of breath, mood changes.  Positive muscle aches  Objective  Blood pressure (!) 150/82, pulse 68, height 5' (1.524 m), weight 157 lb (71.2 kg), SpO2 98 %. Systems examined below as of 03/09/18   General: No apparent distress alert and oriented x3 mood and affect normal, dressed appropriately.  HEENT: Pupils equal, extraocular movements intact  Respiratory: Patient's speak in full sentences and does not appear short of breath  Cardiovascular: No lower extremity edema, non tender, no erythema  Skin: Warm dry intact with no signs of infection or rash on extremities or on axial skeleton.  Abdomen: Soft nontender  Neuro: Cranial nerves II through XII are intact, neurovascularly intact in all extremities with 2+ DTRs and 2+ pulses.  Lymph: No lymphadenopathy of posterior or anterior cervical chain or axillae bilaterally.  Gait normal with good balance and coordination.  MSK:  Non tender with full range of motion and good stability and symmetric strength and tone of shoulders, elbows, wrist, hip, knee  and ankles bilaterally.  Arthritic changes of multiple joints.  Lumbar spine shows the patient does have some loss of lordosis with some very mild degenerative scoliosis.  Patient does have good range of motion noted.  Patient has negative Corky Sox test.  Negative straight leg test but does have some mild tightness of the hamstrings bilaterally.  Neurovascular intact distally.  Osteopathic findings C2 flexed rotated and side bent right T3 extended rotated and side bent right inhaled third rib L5 flexed rotated and side bent left Sacrum right on right       Impression and Recommendations:  This case required medical decision making of moderate complexity.      Note: This dictation was prepared with Dragon dictation along with smaller phrase technology. Any transcriptional errors that result from this process are unintentional.

## 2018-03-09 ENCOUNTER — Ambulatory Visit: Payer: Medicare Other | Admitting: Family Medicine

## 2018-03-09 ENCOUNTER — Encounter: Payer: Self-pay | Admitting: Family Medicine

## 2018-03-09 VITALS — BP 150/82 | HR 68 | Ht 60.0 in | Wt 157.0 lb

## 2018-03-09 DIAGNOSIS — M533 Sacrococcygeal disorders, not elsewhere classified: Secondary | ICD-10-CM

## 2018-03-09 DIAGNOSIS — M999 Biomechanical lesion, unspecified: Secondary | ICD-10-CM

## 2018-03-09 NOTE — Assessment & Plan Note (Signed)
Encourage posture, ergonomics, which activities to do which wants to avoid.  Responded well to manipulation.  Continue to be active.  Follow-up with me again in 6 to 8 weeks.

## 2018-03-09 NOTE — Assessment & Plan Note (Signed)
Decision today to treat with OMT was based on Physical Exam  After verbal consent patient was treated with HVLA, ME, FPR techniques in cervical, thoracic, rib, lumbar and sacral areas  Patient tolerated the procedure well with improvement in symptoms  Patient given exercises, stretches and lifestyle modifications  See medications in patient instructions if given  Patient will follow up in 6-8 weeks 

## 2018-03-09 NOTE — Patient Instructions (Signed)
Good to see you  Ice and Epson salt is fine  Keep up everything else and lets watch the hip  Stay active CoQ10 200-400mg  would be fine.  See me again in 6-8 weeks

## 2018-03-10 DIAGNOSIS — H35372 Puckering of macula, left eye: Secondary | ICD-10-CM | POA: Diagnosis not present

## 2018-03-10 DIAGNOSIS — H33311 Horseshoe tear of retina without detachment, right eye: Secondary | ICD-10-CM | POA: Diagnosis not present

## 2018-03-14 ENCOUNTER — Other Ambulatory Visit: Payer: Self-pay | Admitting: Cardiology

## 2018-03-14 DIAGNOSIS — H33311 Horseshoe tear of retina without detachment, right eye: Secondary | ICD-10-CM | POA: Diagnosis not present

## 2018-03-14 DIAGNOSIS — I25110 Atherosclerotic heart disease of native coronary artery with unstable angina pectoris: Secondary | ICD-10-CM

## 2018-03-16 DIAGNOSIS — H5789 Other specified disorders of eye and adnexa: Secondary | ICD-10-CM | POA: Diagnosis not present

## 2018-03-16 DIAGNOSIS — H43822 Vitreomacular adhesion, left eye: Secondary | ICD-10-CM | POA: Diagnosis not present

## 2018-03-17 ENCOUNTER — Ambulatory Visit: Payer: Medicare Other | Admitting: *Deleted

## 2018-04-06 DIAGNOSIS — H43822 Vitreomacular adhesion, left eye: Secondary | ICD-10-CM | POA: Diagnosis not present

## 2018-04-09 ENCOUNTER — Other Ambulatory Visit: Payer: Self-pay | Admitting: Family Medicine

## 2018-04-11 NOTE — Telephone Encounter (Signed)
Refill done.  

## 2018-04-12 ENCOUNTER — Ambulatory Visit: Payer: Medicare Other | Admitting: Family Medicine

## 2018-04-12 ENCOUNTER — Other Ambulatory Visit: Payer: Self-pay | Admitting: Family Medicine

## 2018-04-12 ENCOUNTER — Encounter: Payer: Self-pay | Admitting: Family Medicine

## 2018-04-12 VITALS — BP 138/86 | HR 68 | Temp 98.8°F | Ht 60.0 in | Wt 156.2 lb

## 2018-04-12 DIAGNOSIS — B9689 Other specified bacterial agents as the cause of diseases classified elsewhere: Secondary | ICD-10-CM | POA: Diagnosis not present

## 2018-04-12 DIAGNOSIS — Z1231 Encounter for screening mammogram for malignant neoplasm of breast: Secondary | ICD-10-CM

## 2018-04-12 DIAGNOSIS — Z8669 Personal history of other diseases of the nervous system and sense organs: Secondary | ICD-10-CM

## 2018-04-12 DIAGNOSIS — Z1211 Encounter for screening for malignant neoplasm of colon: Secondary | ICD-10-CM

## 2018-04-12 DIAGNOSIS — J329 Chronic sinusitis, unspecified: Secondary | ICD-10-CM

## 2018-04-12 MED ORDER — AZITHROMYCIN 250 MG PO TABS: ORAL_TABLET | ORAL | 0 refills | Status: DC

## 2018-04-12 NOTE — Progress Notes (Addendum)
PCP: Marin Olp, MD  Subjective:  Felicia Perry is a 72 y.o. year old very pleasant female patient who presents with sinusitis symptoms including nasal congestion and sinus tenderness. Has had subjective fever and chills.  -other symptoms include: green/brown discharge in chunks. Also coughs up. Starting to clear some but still has coloration. Right ear pain.  -day of illness:over 2 weeks -Symptoms are improving slightly.  R ear worsening.  -previous treatments: tylenol helps with pressure  -sick contacts/travel/risks: denies flu exposure.   ROS-denies SOB, NVD. Does make upper teeth hurt  Pertinent Past Medical History-  Patient Active Problem List   Diagnosis Date Noted  . Coronary artery disease     Priority: High  . Syncope 08/04/2016    Priority: High  . H/O hyperparathyroidism 08/18/2016    Priority: Medium  . Vitamin D deficiency 08/20/2015    Priority: Medium  . Chronic pain syndrome 03/26/2015    Priority: Medium  . Insomnia 07/13/2014    Priority: Medium  . GERD (gastroesophageal reflux disease) 02/25/2011    Priority: Medium  . Depression 04/22/2007    Priority: Medium  . Essential hypertension 04/22/2007    Priority: Medium  . Asthma 04/22/2007    Priority: Medium  . Hyperglycemia 08/06/2015    Priority: Low  . Xerosis of skin 08/06/2015    Priority: Low  . Hypersomnia with sleep apnea 03/26/2015    Priority: Low  . Nonallopathic lesion of lumbosacral region 11/13/2014    Priority: Low  . Osteoarthritis of left lower extremity 10/17/2014    Priority: Low  . Chronic meniscal tear of knee 10/17/2014    Priority: Low  . Nonallopathic lesion of thoracic region 09/14/2014    Priority: Low  . Nonallopathic lesion-rib cage 08/21/2014    Priority: Low  . Tendinopathy of right rotator cuff 07/30/2014    Priority: Low  . Piriformis syndrome of right side 07/30/2014    Priority: Low  . Limited joint range of motion 07/13/2014    Priority: Low  . Rash  07/13/2014    Priority: Low  . SI (sacroiliac) joint dysfunction 10/10/2007    Priority: Low  . LOW BACK PAIN 07/01/2007    Priority: Low  . Degenerative arthritis of left knee 09/15/2017  . Acute pericarditis 12/21/2016  . Nondisplaced fracture of first metatarsal bone, right foot, initial encounter for closed fracture 11/19/2016  . DOE (dyspnea on exertion) 09/02/2016  . PUD (peptic ulcer disease)   . Enlarged lymph nodes 05/19/2016    Medications- reviewed  Current Outpatient Medications  Medication Sig Dispense Refill  . acetaminophen (TYLENOL) 325 MG tablet Take 650 mg by mouth every 6 (six) hours as needed.    Marland Kitchen albuterol (PROVENTIL HFA;VENTOLIN HFA) 108 (90 Base) MCG/ACT inhaler Inhale 2 puffs into the lungs every 6 (six) hours as needed for wheezing or shortness of breath. 1 Inhaler 5  . ALPRAZolam (XANAX) 1 MG tablet Take 1 mg by mouth at bedtime as needed for sleep.    Marland Kitchen amLODipine (NORVASC) 10 MG tablet Take 10 mg by mouth daily.    Marland Kitchen aspirin EC 81 MG tablet Take 1 tablet (81 mg total) by mouth daily. 90 tablet 3  . budesonide-formoterol (SYMBICORT) 160-4.5 MCG/ACT inhaler Inhale 2 puffs into the lungs 2 (two) times daily. 1 Inhaler 3  . co-enzyme Q-10 30 MG capsule Take 50 mg by mouth daily.    Marland Kitchen ezetimibe (ZETIA) 10 MG tablet Take 1 tablet (10 mg total) by mouth daily.  30 tablet 8  . Ferrous Sulfate (IRON) 325 (65 Fe) MG TABS Take 325 mg by mouth daily.    . furosemide (LASIX) 20 MG tablet Take 20 mg by mouth daily for one week, then take 20 mg by mouth daily as needed for lower extremity swelling thereafter. 30 tablet 6  . irbesartan (AVAPRO) 300 MG tablet Take 1 tablet (300 mg total) by mouth daily. 90 tablet 3  . levocetirizine (XYZAL) 5 MG tablet Take 5 mg by mouth every morning.     . metoprolol succinate (TOPROL-XL) 50 MG 24 hr tablet Take 50 mg by mouth daily. Take with or immediately following a meal.    . nitroGLYCERIN (NITROSTAT) 0.4 MG SL tablet Place 1 tablet  (0.4 mg total) under the tongue every 5 (five) minutes as needed for chest pain. 25 tablet 6  . Omega-3 Fatty Acids (FISH OIL) 1000 MG CAPS Take 1 capsule (1,000 mg total) by mouth daily. 90 capsule 2  . pantoprazole (PROTONIX) 40 MG tablet TAKE ONE TABLET DAILY 90 tablet 1  . potassium chloride (K-DUR) 10 MEQ tablet Take 10 mEq by mouth daily.    Marland Kitchen venlafaxine XR (EFFEXOR-XR) 75 MG 24 hr capsule Take 75 mg by mouth daily with breakfast.    . venlafaxine XR (EFFEXOR-XR) 75 MG 24 hr capsule TAKE ONE CAPSULE EACH DAY WITH BREAKFAST 90 capsule 1  . Vitamin D, Ergocalciferol, (DRISDOL) 50000 units CAPS capsule Take 2,000 Units by mouth daily.      No current facility-administered medications for this visit.     Objective: BP 138/86 (BP Location: Left Arm, Patient Position: Sitting, Cuff Size: Normal)   Pulse 68   Temp 98.8 F (37.1 C) (Oral)   Ht 5' (1.524 m)   Wt 156 lb 3.2 oz (70.9 kg)   SpO2 98%   BMI 30.51 kg/m  Gen: NAD, resting comfortably HEENT: Turbinates erythematous with yellow drainage, TM normal on left, on right- air fluid level noted- clear fluid, pharynx mildly erythematous with no tonsilar exudate or edema, right maxillary and frontal sinus tenderness CV: RRR no murmurs rubs or gallops Lungs: CTAB no crackles, wheeze, rhonchi Ext: no edema Skin: warm, dry, no rash Neuro: grossly normal, moves all extremities  Assessment/Plan:  Sinsusitis Bacterial based on: Symptoms >10 days, double sickening, or severe symptoms in first 3 days. Patient is high risk with age, CAD history, history retinal tear within last 3 weeks.   Treatment: -considered steroid: we opted out of prednisone for now. We will send this in if symptoms not improving by Monday particularly in relation to the ear.  -She spoke with her eye doctor today who said low dose prednisone would be ok -other symptomatic care with tylenol for sinus tenderness -Antibiotic indicated: yes. She has tolerated  azithromycin/z pack in the past despite clarithromycin allergy so we will send that in given extensive other allergies  Finally, we reviewed reasons to return to care including if symptoms worsen or persist or new concerns arise (particularly fever or shortness of breath)  Meds ordered this encounter  Medications  . azithromycin (ZITHROMAX) 250 MG tablet    Sig: Take 2 tabs on day 1, then 1 tab daily until finished    Dispense:  6 tablet    Refill:  0   Garret Reddish, MD

## 2018-04-12 NOTE — Patient Instructions (Addendum)
Health Maintenance Due  Topic Date Due  . COLONOSCOPY - Cologuard set up today 03/31/2016   I would also like for you to sign up for an annual wellness visit with one of our nurses, Cassie or Manuela Schwartz, who both specialize in the annual wellness visit. This is a free benefit under medicare that may help [place] find additional ways to help you. Some highlights are reviewing medications, lifestyle, and doing a dementia screen.  Sinsusitis Bacterial based on: Symptoms >10 days, double sickening, or severe symptoms in first 3 days  Treatment: -considered steroid: we opted out of prednisone for now. We will send this in if symptoms not improving by Monday particularly in relation to the ear. She is going to check with her eye doctor to make sure ok for her to take before we send it in.  -other symptomatic care with tylenol for sinus tenderness -Antibiotic indicated: yes. She has tolerated azithromycin/z pack in the past despite clarithromycin allergy so we will send that in given extensive other allergies  Finally, we reviewed reasons to return to care including if symptoms worsen or persist or new concerns arise (particularly fever or shortness of breath)  Meds ordered this encounter  Medications  . azithromycin (ZITHROMAX) 250 MG tablet    Sig: Take 2 tabs on day 1, then 1 tab daily until finished    Dispense:  6 tablet    Refill:  0

## 2018-04-13 ENCOUNTER — Other Ambulatory Visit: Payer: Self-pay | Admitting: Cardiology

## 2018-04-13 DIAGNOSIS — I25110 Atherosclerotic heart disease of native coronary artery with unstable angina pectoris: Secondary | ICD-10-CM

## 2018-04-18 ENCOUNTER — Other Ambulatory Visit: Payer: Self-pay | Admitting: Family Medicine

## 2018-04-20 ENCOUNTER — Encounter: Payer: Self-pay | Admitting: *Deleted

## 2018-04-20 NOTE — Progress Notes (Signed)
Subjective:   Felicia Perry is a 72 y.o. female who presents for Medicare Annual (Subsequent) preventive examination.  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 COLONOSCOPY  03/31/2016   Colonoscopy 03/2011 due in 03/2016 - Sent cologuard off Monday   Mammogram is scheduled for 04/27/2018 Dexa completed 09/2014 -1/5  - due to repeat   Educated regarding shingrix   Former smoker; quit 89 after 25 years       Objective:     Vitals: There were no vitals taken for this visit.  There is no height or weight on file to calculate BMI.  Advanced Directives 11/17/2016 08/14/2016 08/14/2016 03/07/2015  Does Patient Have a Medical Advance Directive? No Yes Yes No  Type of Advance Directive - - Living will;Healthcare Power of Attorney -  Does patient want to make changes to medical advance directive? - No - Patient declined - -  Copy of [place] in Chart? - No - copy requested No - copy requested -  Would patient like information on creating a medical advance directive? - - - Yes - Scientist, clinical (histocompatibility and immunogenetics) given    Tobacco Social History   Tobacco Use  Smoking Status Former Smoker  . Packs/day: 1.00  . Years: 26.00  . Pack years: 26.00  . Types: Cigarettes  . Last attempt to quit: 1989  . Years since quitting: 30.5  Smokeless Tobacco Never Used     Counseling given: Not Answered   Clinical Intake:    Past Medical History:  Diagnosis Date  . Adenomatous polyp 11/23/2005  . Anxiety   . Chronic diastolic CHF (congestive heart failure) ([place])   . Coronary artery disease    a. HC on 08/14/16 showed RCA CTO s/p PCI, 60% LCX disease managed medically with recs  to consider staged PCI if continued symptoms.   Marland Kitchen DIVERTICULOSIS, COLON 04/22/2007       . GERD (gastroesophageal reflux disease)   . History of shingles   . Hypertension   . Hypertensive heart disease   . Internal hemorrhoids   . Osteoarthritis    "knees, hands, lower back" (08/14/2016)  . PUD (peptic ulcer disease)   . Sleep apnea    a. resolved with weight loss   Past Surgical History:  Procedure Laterality Date  . BREAST SURGERY Left 1980s   Fibrous Tumors removed   . CARDIAC CATHETERIZATION N/A 08/14/2016   Procedure: Left Heart Cath and Coronary Angiography;  Surgeon: Sherren Mocha, MD;  Location: [facility];  Service: Cardiovascular;  Laterality: N/A;  . CARDIAC CATHETERIZATION N/A 08/14/2016   Procedure: Coronary Stent Intervention;  Surgeon: Sherren Mocha, MD;  Location: [facility];  Service: Cardiovascular;  Laterality: N/A;  . CATARACT EXTRACTION W/ INTRAOCULAR LENS  IMPLANT, BILATERAL Bilateral 05/2009  . CORONARY ANGIOPLASTY    . detached retina left eye     may 2019  . TUBAL LIGATION  1970s   Family History  Problem Relation Age of Onset  . Heart disease Mother   . Hypertension Mother   . Diabetes Mother   . COPD Father   . Colon cancer Neg  Hx    Social History   Socioeconomic History  . Marital status: Divorced    Spouse name: Not on file  . Number of children: Not on file  . Years of education: Not on file  . Highest education level: Not on file  Occupational History  . Occupation: Education administrator  Social Needs  . Financial resource strain: Not on file  . Food insecurity:    Worry: Not on file    Inability: Not on file  . Transportation needs:    Medical: Not on file    Non-medical: Not on file  Tobacco Use  . Smoking status: Former Smoker    Packs/day: 1.00    Years: 26.00    Pack years: 26.00    Types: Cigarettes    Last attempt to quit: 1989    Years since quitting: 30.5  . Smokeless tobacco: Never Used  Substance and Sexual  Activity  . Alcohol use: No  . Drug use: No  . Sexual activity: Not Currently  Lifestyle  . Physical activity:    Days per week: Not on file    Minutes per session: Not on file  . Stress: Not on file  Relationships  . Social connections:    Talks on phone: Not on file    Gets together: Not on file    Attends religious service: Not on file    Active member of club or organization: Not on file    Attends meetings of clubs or organizations: Not on file    Relationship status: Not on file  Other Topics Concern  . Not on file  Social History Narrative   Divorced for 38 years in 2016. 2 sons.  2 granddaughters.       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       Hobbies: watch tv-survivor, dancing with the stars, enjoy alone time. Best friend [name] patient of Dr. Yong Channel. Enjoys work, time on Teaching laboratory technician.     Outpatient Encounter Medications as of 04/21/2018  Medication Sig  . acetaminophen (TYLENOL) 325 MG tablet Take 650 mg by mouth every 6 (six) hours as needed.  Marland Kitchen albuterol (PROVENTIL HFA;VENTOLIN HFA) 108 (90 Base) MCG/ACT inhaler Inhale 2 puffs into the lungs every 6 (six) hours as needed for wheezing or shortness of breath.  . ALPRAZolam (XANAX) 1 MG tablet Take 1 mg by mouth at bedtime as needed for sleep.  Marland Kitchen amLODipine (NORVASC) 10 MG tablet Take 10 mg by mouth daily.  Marland Kitchen amLODipine (NORVASC) 10 MG tablet TAKE ONE TABLET DAILY  . aspirin EC 81 MG tablet Take 1 tablet (81 mg total) by mouth daily.  Marland Kitchen azithromycin (ZITHROMAX) 250 MG tablet Take 2 tabs on day 1, then 1 tab daily until finished  . budesonide-formoterol (SYMBICORT) 160-4.5 MCG/ACT inhaler Inhale 2 puffs into the lungs 2 (two) times daily.  Marland Kitchen co-enzyme Q-10 30 MG capsule Take 50 mg by mouth daily.  Marland Kitchen ezetimibe (ZETIA) 10 MG tablet Take 1 tablet (10 mg total) by mouth daily.  . Ferrous Sulfate (IRON) 325 (65 Fe) MG TABS Take 325 mg by mouth daily.  . furosemide (LASIX) 20  MG tablet Take 20 mg by mouth daily for one week, then take 20 mg by mouth daily as needed for lower extremity swelling thereafter.  . irbesartan (AVAPRO) 300 MG tablet Take 1 tablet (300 mg total) by mouth daily.  Marland Kitchen levocetirizine (XYZAL) 5 MG tablet Take 5 mg by  mouth every morning.   . metoprolol succinate (TOPROL-XL) 50 MG 24 hr tablet Take 50 mg by mouth daily. Take with or immediately following a meal.  . nitroGLYCERIN (NITROSTAT) 0.4 MG SL tablet Place 1 tablet (0.4 mg total) under the tongue every 5 (five) minutes as needed for chest pain.  . Omega-3 Fatty Acids (FISH OIL) 1000 MG CAPS Take 1 capsule (1,000 mg total) by mouth daily.  . pantoprazole (PROTONIX) 40 MG tablet TAKE ONE TABLET DAILY  . potassium chloride (K-DUR) 10 MEQ tablet Take 10 mEq by mouth daily.  Marland Kitchen venlafaxine XR (EFFEXOR-XR) 75 MG 24 hr capsule Take 75 mg by mouth daily with breakfast.  . venlafaxine XR (EFFEXOR-XR) 75 MG 24 hr capsule TAKE ONE CAPSULE EACH DAY WITH BREAKFAST  . Vitamin D, Ergocalciferol, (DRISDOL) 50000 units CAPS capsule Take 2,000 Units by mouth daily.    No facility-administered encounter medications on file as of 04/21/2018.     Activities of Daily Living No flowsheet data found.  Patient Care Team: Marin Olp, MD as PCP - General (Family Medicine)    Assessment:   This is a routine wellness examination for Chrisette.  Exercise Activities and Dietary recommendations    Goals    None      Fall Risk Fall Risk  02/01/2018 08/04/2016 02/08/2015 01/05/2014  Falls in the past year? Yes Yes No No  Number falls in past yr: 1 2 or more - -  Injury with Fall? No No - -  Follow up Falls evaluation completed - - -    Depression Screen PHQ 2/9 Scores 04/12/2018 02/01/2018 08/04/2016 02/08/2015  PHQ - 2 Score 0 0 0 0  PHQ- 9 Score 0 2 - -     Cognitive Function     Ad8 score reviewed for issues:  Issues making decisions:  Less interest in hobbies / activities:  Repeats questions,  stories (family complaining):  Trouble using ordinary gadgets (microwave, computer, phone):  Forgets the month or year:   Mismanaging finances:   Remembering appts:  Daily problems with thinking and/or memory: Ad8 score is=0        Immunization History  Administered Date(s) Administered  . Influenza Split 09/21/2011, 07/07/2012  . Influenza Whole 08/10/2008, 07/30/2010  . Influenza, High Dose Seasonal PF 08/04/2016  . Influenza,inj,Quad PF,6+ Mos 07/21/2013, 08/06/2015  . Influenza-Unspecified 07/10/2014, 10/01/2017  . Pneumococcal Conjugate-13 01/05/2014  . Pneumococcal Polysaccharide-23 02/08/2015  . Td 10/26/2001  . Tetanus 01/05/2014      Screening Tests Health Maintenance  Topic Date Due  . COLONOSCOPY  03/31/2016  . INFLUENZA VACCINE  05/26/2018  . MAMMOGRAM  09/01/2018  . TETANUS/TDAP  01/06/2024  . DEXA SCAN  Completed  . Hepatitis C Screening  Completed  . PNA vac Low Risk Adult  Completed         Plan:      PCP Notes   Health Maintenance States she sent her cologuard in yesterday   Abnormal Screens  Ms. Broeker forgot to take her BP medicine yesterday BP 190 / 80 this am and came down to 174/80 as she had her bp med this am  Will recheck tonight and tomorrow am and let Roselyn Reef know what it is     Patient concerns; Unresolved ear infection on the right  Dr Yong Channel stated he would order prednisone (low does ) if ear is not better. Please advise;  Xanax needs refill before the 30 th   Also; the patient admits to be  very "nervous" about her son coming to live with her July 11 from prison. He is doing well now, but recommended contract may be helpful and to seek support as needed.   Nurse Concerns; As noted  Next PCP apt 06/03/2018    I have personally reviewed and noted the following in the patient's chart:   . Medical and social history . Use of alcohol, tobacco or illicit drugs  . Current medications and  supplements . Functional ability and status . Nutritional status . Physical activity . Advanced directives . List of other physicians . Hospitalizations, surgeries, and ER visits in previous 12 months . Vitals . Screenings to include cognitive, depression, and falls . Referrals and appointments  In addition, I have reviewed and discussed with patient certain preventive protocols, quality metrics, and best practice recommendations. A written personalized care plan for preventive services as well as general preventive health recommendations were provided to patient.     ZFPOI,PPGFQ, RN  02/14/311  This was a duplicate note

## 2018-04-21 ENCOUNTER — Ambulatory Visit: Payer: Medicare Other | Admitting: Physician Assistant

## 2018-04-21 ENCOUNTER — Encounter: Payer: Medicare Other | Admitting: *Deleted

## 2018-04-21 ENCOUNTER — Encounter: Payer: Self-pay | Admitting: *Deleted

## 2018-04-21 ENCOUNTER — Other Ambulatory Visit: Payer: Self-pay

## 2018-04-21 ENCOUNTER — Encounter: Payer: Self-pay | Admitting: Family Medicine

## 2018-04-21 ENCOUNTER — Ambulatory Visit: Payer: Medicare Other | Admitting: Family Medicine

## 2018-04-21 ENCOUNTER — Ambulatory Visit (INDEPENDENT_AMBULATORY_CARE_PROVIDER_SITE_OTHER): Payer: Medicare Other | Admitting: Family Medicine

## 2018-04-21 ENCOUNTER — Ambulatory Visit (INDEPENDENT_AMBULATORY_CARE_PROVIDER_SITE_OTHER): Payer: Medicare Other | Admitting: *Deleted

## 2018-04-21 VITALS — BP 158/84 | HR 75 | Temp 97.9°F | Ht 60.0 in | Wt 157.0 lb

## 2018-04-21 VITALS — BP 174/80 | Ht 60.0 in | Wt 157.5 lb

## 2018-04-21 DIAGNOSIS — Z0000 Encounter for general adult medical examination without abnormal findings: Secondary | ICD-10-CM | POA: Diagnosis not present

## 2018-04-21 DIAGNOSIS — H6593 Unspecified nonsuppurative otitis media, bilateral: Secondary | ICD-10-CM | POA: Diagnosis not present

## 2018-04-21 DIAGNOSIS — I10 Essential (primary) hypertension: Secondary | ICD-10-CM | POA: Diagnosis not present

## 2018-04-21 MED ORDER — PREDNISONE 20 MG PO TABS: ORAL_TABLET | ORAL | 0 refills | Status: DC

## 2018-04-21 MED ORDER — ALPRAZOLAM 1 MG PO TABS: 1.0000 mg | ORAL_TABLET | Freq: Every evening | ORAL | 2 refills | Status: DC | PRN

## 2018-04-21 NOTE — Patient Instructions (Addendum)
Ms. Furlan , Thank you for taking time to come for your Medicare Wellness Visit. I appreciate your ongoing commitment to your health goals. Please review the following plan we discussed and let me know if I can assist you in the future.   cologuard was mailed yesterday per her report  Will have mammogram in July, scheduled  To consider repeat bone density with mammogram or she can discuss with Dr. Yong Channel at her apt in August   Shingrix is a vaccine for the prevention of Shingles in Adults 22 and older.  If you are on Medicare, the shingrix is covered under your Part D plan, so you will take both of the vaccines in the series at your pharmacy. Please check with your benefits regarding applicable copays or out of pocket expenses.  The Shingrix is given in 2 vaccines approx 8 weeks apart. You must receive the 2nd dose prior to 6 months from receipt of the first. Please have the pharmacist print out you Immunization  dates for our office records     These are the goals we discussed: Goals    . Patient Stated     Will work another year Take time off and plan to take a week off q 4 months        This is a list of the screening recommended for you and due dates:  Health Maintenance  Topic Date Due  . Colon Cancer Screening  03/31/2016  . Flu Shot  05/26/2018  . Mammogram  09/01/2018  . Tetanus Vaccine  01/06/2024  . DEXA scan (bone density measurement)  Completed  .  Hepatitis C: One time screening is recommended by Center for Disease Control  (CDC) for  adults born from [birth year] through 1965.   Completed  . Pneumonia vaccines  Completed     Health Maintenance for Postmenopausal Women Menopause is a normal process in which your reproductive ability comes to an end. This process happens gradually over a span of months to years, usually between the ages of 69 and 98. Menopause is complete when you have missed 12 consecutive menstrual periods. It is important to talk with your health care  provider about some of the most common conditions that affect postmenopausal women, such as heart disease, cancer, and bone loss (osteoporosis). Adopting a healthy lifestyle and getting preventive care can help to promote your health and wellness. Those actions can also lower your chances of developing some of these common conditions. What should I know about menopause? During menopause, you may experience a number of symptoms, such as:  Moderate-to-severe hot flashes.  Night sweats.  Decrease in sex drive.  Mood swings.  Headaches.  Tiredness.  Irritability.  Memory problems.  Insomnia.  Choosing to treat or not to treat menopausal changes is an individual decision that you make with your health care provider. What should I know about hormone replacement therapy and supplements? Hormone therapy products are effective for treating symptoms that are associated with menopause, such as hot flashes and night sweats. Hormone replacement carries certain risks, especially as you become older. If you are thinking about using estrogen or estrogen with progestin treatments, discuss the benefits and risks with your health care provider. What should I know about heart disease and stroke? Heart disease, heart attack, and stroke become more likely as you age. This may be due, in part, to the hormonal changes that your body experiences during menopause. These can affect how your body processes dietary fats, triglycerides, and cholesterol.  Heart attack and stroke are both medical emergencies. There are many things that you can do to help prevent heart disease and stroke:  Have your blood pressure checked at least every 1-2 years. High blood pressure causes heart disease and increases the risk of stroke.  If you are 75-29 years old, ask your health care provider if you should take aspirin to prevent a heart attack or a stroke.  Do not use any tobacco products, including cigarettes, chewing tobacco,  or electronic cigarettes. If you need help quitting, ask your health care provider.  It is important to eat a healthy diet and maintain a healthy weight. ? Be sure to include plenty of vegetables, fruits, low-fat dairy products, and lean protein. ? Avoid eating foods that are high in solid fats, added sugars, or salt (sodium).  Get regular exercise. This is one of the most important things that you can do for your health. ? Try to exercise for at least 150 minutes each week. The type of exercise that you do should increase your heart rate and make you sweat. This is known as moderate-intensity exercise. ? Try to do strengthening exercises at least twice each week. Do these in addition to the moderate-intensity exercise.  Know your numbers.Ask your health care provider to check your cholesterol and your blood glucose. Continue to have your blood tested as directed by your health care provider.  What should I know about cancer screening? There are several types of cancer. Take the following steps to reduce your risk and to catch any cancer development as early as possible. Breast Cancer  Practice breast self-awareness. ? This means understanding how your breasts normally appear and feel. ? It also means doing regular breast self-exams. Let your health care provider know about any changes, no matter how small.  If you are 54 or older, have a clinician do a breast exam (clinical breast exam or CBE) every year. Depending on your age, family history, and medical history, it may be recommended that you also have a yearly breast X-ray (mammogram).  If you have a family history of breast cancer, talk with your health care provider about genetic screening.  If you are at high risk for breast cancer, talk with your health care provider about having an MRI and a mammogram every year.  Breast cancer (BRCA) gene test is recommended for women who have family members with BRCA-related cancers. Results of  the assessment will determine the need for genetic counseling and BRCA1 and for BRCA2 testing. BRCA-related cancers include these types: ? Breast. This occurs in males or females. ? Ovarian. ? Tubal. This may also be called fallopian tube cancer. ? Cancer of the abdominal or pelvic lining (peritoneal cancer). ? Prostate. ? Pancreatic.  Cervical, Uterine, and Ovarian Cancer Your health care provider may recommend that you be screened regularly for cancer of the pelvic organs. These include your ovaries, uterus, and vagina. This screening involves a pelvic exam, which includes checking for microscopic changes to the surface of your cervix (Pap test).  For women ages 21-65, health care providers may recommend a pelvic exam and a Pap test every three years. For women ages 75-65, they may recommend the Pap test and pelvic exam, combined with testing for human papilloma virus (HPV), every five years. Some types of HPV increase your risk of cervical cancer. Testing for HPV may also be done on women of any age who have unclear Pap test results.  Other health care providers may not  recommend any screening for nonpregnant women who are considered low risk for pelvic cancer and have no symptoms. Ask your health care provider if a screening pelvic exam is right for you.  If you have had past treatment for cervical cancer or a condition that could lead to cancer, you need Pap tests and screening for cancer for at least 20 years after your treatment. If Pap tests have been discontinued for you, your risk factors (such as having a new sexual partner) need to be reassessed to determine if you should start having screenings again. Some women have medical problems that increase the chance of getting cervical cancer. In these cases, your health care provider may recommend that you have screening and Pap tests more often.  If you have a family history of uterine cancer or ovarian cancer, talk with your health care  provider about genetic screening.  If you have vaginal bleeding after reaching menopause, tell your health care provider.  There are currently no reliable tests available to screen for ovarian cancer.  Lung Cancer Lung cancer screening is recommended for adults 42-37 years old who are at high risk for lung cancer because of a history of smoking. A yearly low-dose CT scan of the lungs is recommended if you:  Currently smoke.  Have a history of at least 30 pack-years of smoking and you currently smoke or have quit within the past 15 years. A pack-year is smoking an average of one pack of cigarettes per day for one year.  Yearly screening should:  Continue until it has been 15 years since you quit.  Stop if you develop a health problem that would prevent you from having lung cancer treatment.  Colorectal Cancer  This type of cancer can be detected and can often be prevented.  Routine colorectal cancer screening usually begins at age 46 and continues through age 31.  If you have risk factors for colon cancer, your health care provider may recommend that you be screened at an earlier age.  If you have a family history of colorectal cancer, talk with your health care provider about genetic screening.  Your health care provider may also recommend using home test kits to check for hidden blood in your stool.  A small camera at the end of a tube can be used to examine your colon directly (sigmoidoscopy or colonoscopy). This is done to check for the earliest forms of colorectal cancer.  Direct examination of the colon should be repeated every 5-10 years until age 60. However, if early forms of precancerous polyps or small growths are found or if you have a family history or genetic risk for colorectal cancer, you may need to be screened more often.  Skin Cancer  Check your skin from head to toe regularly.  Monitor any moles. Be sure to tell your health care provider: ? About any new  moles or changes in moles, especially if there is a change in a mole's shape or color. ? If you have a mole that is larger than the size of a pencil eraser.  If any of your family members has a history of skin cancer, especially at a young age, talk with your health care provider about genetic screening.  Always use sunscreen. Apply sunscreen liberally and repeatedly throughout the day.  Whenever you are outside, protect yourself by wearing long sleeves, pants, a wide-brimmed hat, and sunglasses.  What should I know about osteoporosis? Osteoporosis is a condition in which bone destruction happens more  quickly than new bone creation. After menopause, you may be at an increased risk for osteoporosis. To help prevent osteoporosis or the bone fractures that can happen because of osteoporosis, the following is recommended:  If you are 65-29 years old, get at least 1,000 mg of calcium and at least 600 mg of vitamin D per day.  If you are older than age 76 but younger than age 70, get at least 1,200 mg of calcium and at least 600 mg of vitamin D per day.  If you are older than age 37, get at least 1,200 mg of calcium and at least 800 mg of vitamin D per day.  Smoking and excessive alcohol intake increase the risk of osteoporosis. Eat foods that are rich in calcium and vitamin D, and do weight-bearing exercises several times each week as directed by your health care provider. What should I know about how menopause affects my mental health? Depression may occur at any age, but it is more common as you become older. Common symptoms of depression include:  Low or sad mood.  Changes in sleep patterns.  Changes in appetite or eating patterns.  Feeling an overall lack of motivation or enjoyment of activities that you previously enjoyed.  Frequent crying spells.  Talk with your health care provider if you think that you are experiencing depression. What should I know about immunizations? It is  important that you get and maintain your immunizations. These include:  Tetanus, diphtheria, and pertussis (Tdap) booster vaccine.  Influenza every year before the flu season begins.  Pneumonia vaccine.  Shingles vaccine.  Your health care provider may also recommend other immunizations. This information is not intended to replace advice given to you by your health care provider. Make sure you discuss any questions you have with your health care provider. Document Released: 12/04/2005 Document Revised: 05/01/2016 Document Reviewed: 07/16/2015 Elsevier Interactive Patient Education  2018 [place] in the Home Falls can cause injuries. They can happen to people of all ages. There are many things you can do to make your home safe and to help prevent falls. What can I do on the outside of my home?  Regularly fix the edges of walkways and driveways and fix any cracks.  Remove anything that might make you trip as you walk through a door, such as a raised step or threshold.  Trim any bushes or trees on the path to your home.  Use bright outdoor lighting.  Clear any walking paths of anything that might make someone trip, such as rocks or tools.  Regularly check to see if handrails are loose or broken. Make sure that both sides of any steps have handrails.  Any raised decks and porches should have guardrails on the edges.  Have any leaves, snow, or ice cleared regularly.  Use sand or salt on walking paths during winter.  Clean up any spills in your garage right away. This includes oil or grease spills. What can I do in the bathroom?  Use night lights.  Install grab bars by the toilet and in the tub and shower. Do not use towel bars as grab bars.  Use non-skid mats or decals in the tub or shower.  If you need to sit down in the shower, use a plastic, non-slip stool.  Keep the floor dry. Clean up any water that spills on the floor as soon as it  happens.  Remove soap buildup in the tub or shower regularly.  Attach bath mats securely with double-sided non-slip rug tape.  Do not have throw rugs and other things on the floor that can make you trip. What can I do in the bedroom?  Use night lights.  Make sure that you have a light by your bed that is easy to reach.  Do not use any sheets or blankets that are too big for your bed. They should not hang down onto the floor.  Have a firm chair that has side arms. You can use this for support while you get dressed.  Do not have throw rugs and other things on the floor that can make you trip. What can I do in the kitchen?  Clean up any spills right away.  Avoid walking on wet floors.  Keep items that you use a lot in easy-to-reach places.  If you need to reach something above you, use a strong step stool that has a grab bar.  Keep electrical cords out of the way.  Do not use floor polish or wax that makes floors slippery. If you must use wax, use non-skid floor wax.  Do not have throw rugs and other things on the floor that can make you trip. What can I do with my stairs?  Do not leave any items on the stairs.  Make sure that there are handrails on both sides of the stairs and use them. Fix handrails that are broken or loose. Make sure that handrails are as long as the stairways.  Check any carpeting to make sure that it is firmly attached to the stairs. Fix any carpet that is loose or worn.  Avoid having throw rugs at the top or bottom of the stairs. If you do have throw rugs, attach them to the floor with carpet tape.  Make sure that you have a light switch at the top of the stairs and the bottom of the stairs. If you do not have them, ask someone to add them for you. What else can I do to help prevent falls?  Wear shoes that: ? Do not have high heels. ? Have rubber bottoms. ? Are comfortable and fit you well. ? Are closed at the toe. Do not wear sandals.  If you  use a stepladder: ? Make sure that it is fully opened. Do not climb a closed stepladder. ? Make sure that both sides of the stepladder are locked into place. ? Ask someone to hold it for you, if possible.  Clearly mark and make sure that you can see: ? Any grab bars or handrails. ? First and last steps. ? Where the edge of each step is.  Use tools that help you move around (mobility aids) if they are needed. These include: ? Canes. ? Walkers. ? Scooters. ? Crutches.  Turn on the lights when you go into a dark area. Replace any light bulbs as soon as they burn out.  Set up your furniture so you have a clear path. Avoid moving your furniture around.  If any of your floors are uneven, fix them.  If there are any pets around you, be aware of where they are.  Review your medicines with your doctor. Some medicines can make you feel dizzy. This can increase your chance of falling. Ask your doctor what other things that you can do to help prevent falls. This information is not intended to replace advice given to you by your health care provider. Make sure you discuss any questions you have with  your health care provider. Document Released: 08/08/2009 Document Revised: 03/19/2016 Document Reviewed: 11/16/2014 Elsevier Interactive Patient Education  [employer].

## 2018-04-21 NOTE — Progress Notes (Signed)
Team please review chart/meds to see if xanax refill appropriate and let me know - I can send in electronically

## 2018-04-21 NOTE — Patient Instructions (Addendum)
It was very nice to see you today!  We will start prednisone today. Please take 2 pills today and then 1 pill a day for the next 4 days.  Please keep a close eye on your blood pressure.  Please let me or Dr. Yong Channel know if it is persistently 140/90 or higher.  Please try to avoid salty foods as this can increase her blood pressure.  Let me know or let Dr. Yong Channel know if your symptoms worsen or do not improve over the next 1 to 2 weeks.  Take care, Dr Jerline Pain

## 2018-04-21 NOTE — Progress Notes (Signed)
Subjective:   Felicia Perry is a 72 y.o. female who presents for Medicare Annual (Subsequent) preventive examination. TO NOTE; THIS NOTE WAS LOST WHEN AN APT WAS MADE FOR HER TO SEE dR. PARKER; NOTE WAS RECREATED.    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 She also plays tennis and enjoys staying active Walks every am  Health Maintenance Due  Topic Date Due  . COLONOSCOPY  03/31/2016   States she sent in her colo-guard yesterday Educated regarding the shingrix Has a mammogram scheduled. Asked her to consider having a repeat Dexa scan ( in her summary) to track progression of osteopenia. (May need to fup at her next office as she was scheduled for acute with Dr. Jerline Pain at 9:40)        Objective:     Vitals: BP (!) 174/80   Ht 5' (1.524 m)   Wt 157 lb 8 oz (71.4 kg)   BMI 30.76 kg/m   Body mass index is 30.76 kg/m.  Advanced Directives 04/21/2018 11/17/2016 08/14/2016 08/14/2016 03/07/2015  Does Patient Have a Medical Advance Directive? Yes No Yes Yes No  Type of Advance Directive - - - Living will;Healthcare Power of Attorney -  Does patient want to make changes to medical advance directive? - - No - Patient declined - -  Copy of [place] in Chart? - - No - copy requested No - copy requested -  Would patient like information on creating a medical advance directive? - - - - Yes - Scientist, clinical (histocompatibility and immunogenetics) given    Tobacco Social History   Tobacco Use  Smoking Status Former Smoker  . Packs/day: 1.00  . Years: 26.00  . Pack years: 26.00  . Types: Cigarettes  . Last attempt to quit: 1989  . Years since quitting: 30.5  Smokeless Tobacco Never Used     Counseling given: Yes   Clinical Intake:   Past Medical History:  Diagnosis Date  . Adenomatous polyp 11/23/2005  . Anxiety   . Chronic diastolic CHF (congestive heart  failure) ([place])   . Coronary artery disease    a. HC on 08/14/16 showed RCA CTO s/p PCI, 60% LCX disease managed medically with recs to consider staged PCI if continued symptoms.   Marland Kitchen DIVERTICULOSIS, COLON 04/22/2007       . GERD (gastroesophageal reflux disease)   . History of shingles   . Hypertension   . Hypertensive heart disease   . Internal hemorrhoids   . Osteoarthritis    "knees, hands, lower back" (08/14/2016)  . PUD (peptic ulcer disease)   . Sleep apnea    a. resolved with weight loss   Past Surgical History:  Procedure Laterality Date  . BREAST SURGERY Left 1980s   Fibrous Tumors removed   . CARDIAC CATHETERIZATION N/A 08/14/2016   Procedure: Left Heart Cath and Coronary Angiography;  Surgeon: Sherren Mocha, MD;  Location: [facility];  Service: Cardiovascular;  Laterality: N/A;  . CARDIAC CATHETERIZATION N/A 08/14/2016   Procedure: Coronary Stent Intervention;  Surgeon: Sherren Mocha, MD;  Location: [facility];  Service: Cardiovascular;  Laterality: N/A;  . CATARACT EXTRACTION W/ INTRAOCULAR LENS  IMPLANT, BILATERAL Bilateral 05/2009  . CORONARY ANGIOPLASTY    . detached retina left eye     may 2019  . TUBAL LIGATION  1970s  Family History  Problem Relation Age of Onset  . Heart disease Mother   . Hypertension Mother   . Diabetes Mother   . COPD Father   . Colon cancer Neg Hx    Social History   Socioeconomic History  . Marital status: Divorced    Spouse name: Not on file  . Number of children: Not on file  . Years of education: Not on file  . Highest education level: Not on file  Occupational History  . Occupation: Education administrator  Social Needs  . Financial resource strain: Not on file  . Food insecurity:    Worry: Not on file    Inability: Not on file  . Transportation needs:    Medical: Not on file    Non-medical: Not on file  Tobacco Use  . Smoking status: Former Smoker    Packs/day: 1.00    Years: 26.00    Pack years: 26.00     Types: Cigarettes    Last attempt to quit: 1989    Years since quitting: 30.5  . Smokeless tobacco: Never Used  Substance and Sexual Activity  . Alcohol use: No  . Drug use: No  . Sexual activity: Not Currently  Lifestyle  . Physical activity:    Days per week: Not on file    Minutes per session: Not on file  . Stress: Not on file  Relationships  . Social connections:    Talks on phone: Not on file    Gets together: Not on file    Attends religious service: Not on file    Active member of club or organization: Not on file    Attends meetings of clubs or organizations: Not on file    Relationship status: Not on file  Other Topics Concern  . Not on file  Social History Narrative   Divorced for 38 years in 2016. 2 sons.  2 granddaughters.       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       Hobbies: watch tv-survivor, dancing with the stars, enjoy alone time. Best friend [name] patient of Dr. Yong Channel. Enjoys work, time on Teaching laboratory technician.     Outpatient Encounter Medications as of 04/21/2018  Medication Sig  . acetaminophen (TYLENOL) 325 MG tablet Take 650 mg by mouth every 6 (six) hours as needed.  Marland Kitchen albuterol (PROVENTIL HFA;VENTOLIN HFA) 108 (90 Base) MCG/ACT inhaler Inhale 2 puffs into the lungs every 6 (six) hours as needed for wheezing or shortness of breath.  . ALPRAZolam (XANAX) 1 MG tablet Take 1 mg by mouth at bedtime as needed for sleep.  Marland Kitchen amLODipine (NORVASC) 10 MG tablet TAKE ONE TABLET DAILY  . aspirin EC 81 MG tablet Take 1 tablet (81 mg total) by mouth daily.  . budesonide-formoterol (SYMBICORT) 160-4.5 MCG/ACT inhaler Inhale 2 puffs into the lungs 2 (two) times daily.  Marland Kitchen co-enzyme Q-10 30 MG capsule Take 50 mg by mouth daily.  Marland Kitchen ezetimibe (ZETIA) 10 MG tablet Take 1 tablet (10 mg total) by mouth daily.  . Ferrous Sulfate (IRON) 325 (65 Fe) MG TABS Take 325 mg by mouth daily.  . furosemide (LASIX) 20 MG tablet Take 20 mg by  mouth daily for one week, then take 20 mg by mouth daily as needed for lower extremity swelling thereafter.  . irbesartan (AVAPRO) 300 MG tablet Take 1 tablet (300 mg total) by mouth daily.  Marland Kitchen levocetirizine (XYZAL) 5 MG tablet Take 5  mg by mouth every morning.   . metoprolol succinate (TOPROL-XL) 50 MG 24 hr tablet Take 50 mg by mouth daily. Take with or immediately following a meal.  . Omega-3 Fatty Acids (FISH OIL) 1000 MG CAPS Take 1 capsule (1,000 mg total) by mouth daily.  . pantoprazole (PROTONIX) 40 MG tablet TAKE ONE TABLET DAILY  . potassium chloride (K-DUR) 10 MEQ tablet Take 10 mEq by mouth daily.  . predniSONE (DELTASONE) 20 MG tablet Take 2 pills today, then 1 pill a day for the next 4 days.  Marland Kitchen venlafaxine XR (EFFEXOR-XR) 75 MG 24 hr capsule TAKE ONE CAPSULE EACH DAY WITH BREAKFAST  . Vitamin D, Ergocalciferol, (DRISDOL) 50000 units CAPS capsule Take 2,000 Units by mouth daily.   . nitroGLYCERIN (NITROSTAT) 0.4 MG SL tablet Place 1 tablet (0.4 mg total) under the tongue every 5 (five) minutes as needed for chest pain.   No facility-administered encounter medications on file as of 04/21/2018.     Activities of Daily Living In your present state of health, do you have any difficulty performing the following activities: 04/21/2018  Hearing? N  Vision? N  Difficulty concentrating or making decisions? N  Comment still working and running a business   Walking or climbing stairs? N  Dressing or bathing? N  Doing errands, shopping? N  Preparing Food and eating ? N  Using the Toilet? N  In the past six months, have you accidently leaked urine? N  Do you have problems with loss of bowel control? N  Managing your Medications? N  Managing your Finances? N  Housekeeping or managing your Housekeeping? N  Some recent data might be hidden    Patient Care Team: Marin Olp, MD as PCP - General (Family Medicine)    Assessment:   This is a routine wellness examination for  [place].  Exercise Activities and Dietary recommendations    Goals    . Patient Stated     Will work another year Take time off and plan to take a week off q 4 months        Fall Risk Fall Risk  04/21/2018 04/21/2018 02/01/2018 08/04/2016 02/08/2015  Falls in the past year? No No Yes Yes No  Number falls in past yr: - - 1 2 or more -  Injury with Fall? - - No No -  Follow up - - Falls evaluation completed - -     Depression Screen PHQ 2/9 Scores 04/21/2018 04/21/2018 04/12/2018 02/01/2018  PHQ - 2 Score 0 0 0 0  PHQ- 9 Score - - 0 2     Cognitive Function MMSE - Mini Mental State Exam 04/21/2018 04/21/2018  Not completed: (No Data) (No Data)     Ad8 score reviewed for issues:  Issues making decisions:  Less interest in hobbies / activities:  Repeats questions, stories (family complaining):  Trouble using ordinary gadgets (microwave, computer, phone):  Forgets the month or year:   Mismanaging finances:   Remembering appts:  Daily problems with thinking and/or memory: Ad8 score is=0        Immunization History  Administered Date(s) Administered  . Influenza Split 09/21/2011, 07/07/2012  . Influenza Whole 08/10/2008, 07/30/2010  . Influenza, High Dose Seasonal PF 08/04/2016  . Influenza,inj,Quad PF,6+ Mos 07/21/2013, 08/06/2015  . Influenza-Unspecified 07/10/2014, 10/01/2017  . Pneumococcal Conjugate-13 01/05/2014  . Pneumococcal Polysaccharide-23 02/08/2015  . Td 10/26/2001  . Tetanus 01/05/2014     Screening Tests Health Maintenance  Topic Date Due  .  COLONOSCOPY  03/31/2016  . INFLUENZA VACCINE  05/26/2018  . MAMMOGRAM  09/01/2018  . TETANUS/TDAP  01/06/2024  . DEXA SCAN  Completed  . Hepatitis C Screening  Completed  . PNA vac Low Risk Adult  Completed         Plan:    PCP Notes   Health Maintenance cologuard in the mail 06/26  Mammogram scheduled 07/03  The patient had an apt with Dr. Jerline Pain at 9:40 but did request she consider  repeating her dexa on her AVS to track bone loss  Educated regarding the shingrix   Abnormal Screens  BP was elevated 190 80 on arrival. Stated she forgot to take her BP med yesterday but did take it today. Recheck 174 80 Requested she take her BP tonight and in the am and call Roselyn Reef with the reading She agreed to fup    C/o of unresolved ear fluid, completed antibiotics and will see Dr. Jerline Pain today   Referrals  none  Patient concerns; Son coming home July 11th to live with her. She is expected anxious regarding his return but recommended she consider making a contract with him to clarify boundaries if helpful. Will seek outside assistance as needed. Denies depression. Staying active   Needs xanax refilled; has enough until 6/30  (will send separate basket note)   Nurse Concerns; As noted   Next PCP apt 06/03/2018    I have personally reviewed and noted the following in the patient's chart:   . Medical and social history . Use of alcohol, tobacco or illicit drugs  . Current medications and supplements . Functional ability and status . Nutritional status . Physical activity . Advanced directives . List of other physicians . Hospitalizations, surgeries, and ER visits in previous 12 months . Vitals . Screenings to include cognitive, depression, and falls . Referrals and appointments  In addition, I have reviewed and discussed with patient certain preventive protocols, quality metrics, and best practice recommendations. A written personalized care plan for preventive services as well as general preventive health recommendations were provided to patient.     Wynetta Fines, RN  04/21/2018

## 2018-04-21 NOTE — Progress Notes (Signed)
   Subjective:  Felicia Perry is a 72 y.o. female who presents today for same-day appointment with a chief complaint of ear pain.   HPI:  Ear Pain, acute problem For about 4 weeks ago.  She saw her PCP about a week ago for sinusitis.  She was started on Zithromax at that time.  Sinusitis symptoms have improved however she has had continued right ear pain.  Per patient, they discussed starting prednisone the last visit, however deferred given that she occasionally gets fluid retention and increased appetite on prednisone.  No reported fevers or chills.  No other obvious alleviating or aggravating factors.  Hypertension, chronic problem, worsening Thyroid history.  Currently on amlodipine 10 mg daily, metoprolol succinate 50 mg daily, and irbesartan 300 mg daily.  She is typically compliant with all his medications without any side effects.  Patient reports that she missed her dose of blood pressure meds last night and thinks this could have increased her blood pressure.  Also had [place] fries last night which is not typical for her.  No reported chest pain or shortness of breath.  No reported lower extremity swelling.  ROS: Per HPI  PMH: She reports that she quit smoking about 30 years ago. Her smoking use included cigarettes. She has a 26.00 pack-year smoking history. She has never used smokeless tobacco. She reports that she does not drink alcohol or use drugs.  Objective:  Physical Exam: BP (!) 158/84 (BP Location: Left Arm, Patient Position: Sitting, Cuff Size: Normal)   Pulse 75   Temp 97.9 F (36.6 C) (Oral)   Ht 5' (1.524 m)   Wt 157 lb (71.2 kg)   SpO2 98%   BMI 30.66 kg/m   Gen: NAD, resting comfortably HEENT: TMs with clear effusion bilaterally.  Oropharynx clear.  Nose mucosa slightly erythematous bilaterally with no discharge. CV: RRR with no murmurs appreciated Pulm: NWOB, CTAB with no crackles, wheezes, or rhonchi  Assessment/Plan:  Bilateral middle ear  effusion Likely secondary to recent respiratory infection with upper respiratory congestion.  No signs of bacterial infection today.  Given that she has had continued pain and symptoms for the past 4 weeks, will start low-dose prednisone today.  Discussed reasons to return to care.  Follow-up as needed.  Hypertension Elevated today in setting of recent large salt intake, missed dose of blood pressure medications, and acute illness.  She is purposely been well controlled.  Will not adjust her medications today.  Continue current doses of Norvasc, metoprolol, and irbesartan.  Advised close home blood pressure monitoring with goal 140/90 or lower.  Discussed reasons to return to care.  Algis Greenhouse. Jerline Pain, MD 04/21/2018 9:47 AM

## 2018-04-21 NOTE — Progress Notes (Signed)
I have personally reviewed the Medicare Annual Wellness Visit and agree with the assessment and plan.  Please see my separate office visit note regarding ear effusion and elevated blood pressure reading.   Algis Greenhouse. Jerline Pain, MD 04/21/2018 11:35 AM

## 2018-04-21 NOTE — Patient Instructions (Addendum)
Felicia Perry , Thank you for taking time to come for your Medicare Wellness Visit. I appreciate your ongoing commitment to your health goals. Please review the following plan we discussed and let me know if I can assist you in the future.   Felicia Perry forgot to take her BP medicine yesterday BP 190 / 80 this am and came down to 174/80 as she had her bp med this am  You had a bone density in 2015. I do not see that you have had one since, so I would like for you to consider repeating this year or you can discuss with Dr. Yong Channel in August   Health Maintenance, Female Adopting a healthy lifestyle and getting preventive care can go a long way to promote health and wellness. Talk with your health care provider about what schedule of regular examinations is right for you. This is a good chance for you to check in with your provider about disease prevention and staying healthy. In between checkups, there are plenty of things you can do on your own. Experts have done a lot of research about which lifestyle changes and preventive measures are most likely to keep you healthy. Ask your health care provider for more information. Weight and diet Eat a healthy diet  Be sure to include plenty of vegetables, fruits, low-fat dairy products, and lean protein.  Do not eat a lot of foods high in solid fats, added sugars, or salt.  Get regular exercise. This is one of the most important things you can do for your health. ? Most adults should exercise for at least 150 minutes each week. The exercise should increase your heart rate and make you sweat (moderate-intensity exercise). ? Most adults should also do strengthening exercises at least twice a week. This is in addition to the moderate-intensity exercise.  Maintain a healthy weight  Body mass index (BMI) is a measurement that can be used to identify possible weight problems. It estimates body fat based on height and weight. Your health care provider can help  determine your BMI and help you achieve or maintain a healthy weight.  For females 34 years of age and older: ? A BMI below 18.5 is considered underweight. ? A BMI of 18.5 to 24.9 is normal. ? A BMI of 25 to 29.9 is considered overweight. ? A BMI of 30 and above is considered obese.  Watch levels of cholesterol and blood lipids  You should start having your blood tested for lipids and cholesterol at 72 years of age, then have this test every 5 years.  You may need to have your cholesterol levels checked more often if: ? Your lipid or cholesterol levels are high. ? You are older than 72 years of age. ? You are at high risk for heart disease.  Cancer screening Lung Cancer  Lung cancer screening is recommended for adults 33-41 years old who are at high risk for lung cancer because of a history of smoking.  A yearly low-dose CT scan of the lungs is recommended for people who: ? Currently smoke. ? Have quit within the past 15 years. ? Have at least a 30-pack-year history of smoking. A pack year is smoking an average of one pack of cigarettes a day for 1 year.  Yearly screening should continue until it has been 15 years since you quit.  Yearly screening should stop if you develop a health problem that would prevent you from having lung cancer treatment.  Breast Cancer  Practice breast self-awareness. This means understanding how your breasts normally appear and feel.  It also means doing regular breast self-exams. Let your health care provider know about any changes, no matter how small.  If you are in your 20s or 30s, you should have a clinical breast exam (CBE) by a health care provider every 1-3 years as part of a regular health exam.  If you are 40 or older, have a CBE every year. Also consider having a breast X-ray (mammogram) every year.  If you have a family history of breast cancer, talk to your health care provider about genetic screening.  If you are at high risk for  breast cancer, talk to your health care provider about having an MRI and a mammogram every year.  Breast cancer gene (BRCA) assessment is recommended for women who have family members with BRCA-related cancers. BRCA-related cancers include: ? Breast. ? Ovarian. ? Tubal. ? Peritoneal cancers.  Results of the assessment will determine the need for genetic counseling and BRCA1 and BRCA2 testing.  Cervical Cancer Your health care provider may recommend that you be screened regularly for cancer of the pelvic organs (ovaries, uterus, and vagina). This screening involves a pelvic examination, including checking for microscopic changes to the surface of your cervix (Pap test). You may be encouraged to have this screening done every 3 years, beginning at age 26.  For women ages 42-65, health care providers may recommend pelvic exams and Pap testing every 3 years, or they may recommend the Pap and pelvic exam, combined with testing for human papilloma virus (HPV), every 5 years. Some types of HPV increase your risk of cervical cancer. Testing for HPV may also be done on women of any age with unclear Pap test results.  Other health care providers may not recommend any screening for nonpregnant women who are considered low risk for pelvic cancer and who do not have symptoms. Ask your health care provider if a screening pelvic exam is right for you.  If you have had past treatment for cervical cancer or a condition that could lead to cancer, you need Pap tests and screening for cancer for at least 20 years after your treatment. If Pap tests have been discontinued, your risk factors (such as having a new sexual partner) need to be reassessed to determine if screening should resume. Some women have medical problems that increase the chance of getting cervical cancer. In these cases, your health care provider may recommend more frequent screening and Pap tests.  Colorectal Cancer  This type of cancer can be  detected and often prevented.  Routine colorectal cancer screening usually begins at 72 years of age and continues through 72 years of age.  Your health care provider may recommend screening at an earlier age if you have risk factors for colon cancer.  Your health care provider may also recommend using home test kits to check for hidden blood in the stool.  A small camera at the end of a tube can be used to examine your colon directly (sigmoidoscopy or colonoscopy). This is done to check for the earliest forms of colorectal cancer.  Routine screening usually begins at age 53.  Direct examination of the colon should be repeated every 5-10 years through 72 years of age. However, you may need to be screened more often if early forms of precancerous polyps or small growths are found.  Skin Cancer  Check your skin from head to toe regularly.  Tell your health care  provider about any new moles or changes in moles, especially if there is a change in a mole's shape or color.  Also tell your health care provider if you have a mole that is larger than the size of a pencil eraser.  Always use sunscreen. Apply sunscreen liberally and repeatedly throughout the day.  Protect yourself by wearing long sleeves, pants, a wide-brimmed hat, and sunglasses whenever you are outside.  Heart disease, diabetes, and high blood pressure  High blood pressure causes heart disease and increases the risk of stroke. High blood pressure is more likely to develop in: ? People who have blood pressure in the high end of the normal range (130-139/85-89 mm Hg). ? People who are overweight or obese. ? People who are African American.  If you are 77-50 years of age, have your blood pressure checked every 3-5 years. If you are 59 years of age or older, have your blood pressure checked every year. You should have your blood pressure measured twice-once when you are at a hospital or clinic, and once when you are not at a  hospital or clinic. Record the average of the two measurements. To check your blood pressure when you are not at a hospital or clinic, you can use: ? An automated blood pressure machine at a pharmacy. ? A home blood pressure monitor.  If you are between 36 years and 24 years old, ask your health care provider if you should take aspirin to prevent strokes.  Have regular diabetes screenings. This involves taking a blood sample to check your fasting blood sugar level. ? If you are at a normal weight and have a low risk for diabetes, have this test once every three years after 72 years of age. ? If you are overweight and have a high risk for diabetes, consider being tested at a younger age or more often. Preventing infection Hepatitis B  If you have a higher risk for hepatitis B, you should be screened for this virus. You are considered at high risk for hepatitis B if: ? You were born in a country where hepatitis B is common. Ask your health care provider which countries are considered high risk. ? Your parents were born in a high-risk country, and you have not been immunized against hepatitis B (hepatitis B vaccine). ? You have HIV or AIDS. ? You use needles to inject street drugs. ? You live with someone who has hepatitis B. ? You have had sex with someone who has hepatitis B. ? You get hemodialysis treatment. ? You take certain medicines for conditions, including cancer, organ transplantation, and autoimmune conditions.  Hepatitis C  Blood testing is recommended for: ? Everyone born from [birth year] through 1965. ? Anyone with known risk factors for hepatitis C.  Sexually transmitted infections (STIs)  You should be screened for sexually transmitted infections (STIs) including gonorrhea and chlamydia if: ? You are sexually active and are younger than 72 years of age. ? You are older than 72 years of age and your health care provider tells you that you are at risk for this type of  infection. ? Your sexual activity has changed since you were last screened and you are at an increased risk for chlamydia or gonorrhea. Ask your health care provider if you are at risk.  If you do not have HIV, but are at risk, it may be recommended that you take a prescription medicine daily to prevent HIV infection. This is called pre-exposure prophylaxis (PrEP). You  are considered at risk if: ? You are sexually active and do not regularly use condoms or know the HIV status of your partner(s). ? You take drugs by injection. ? You are sexually active with a partner who has HIV.  Talk with your health care provider about whether you are at high risk of being infected with HIV. If you choose to begin PrEP, you should first be tested for HIV. You should then be tested every 3 months for as long as you are taking PrEP. Pregnancy  If you are premenopausal and you may become pregnant, ask your health care provider about preconception counseling.  If you may become pregnant, take 400 to 800 micrograms (mcg) of folic acid every day.  If you want to prevent pregnancy, talk to your health care provider about birth control (contraception). Osteoporosis and menopause  Osteoporosis is a disease in which the bones lose minerals and strength with aging. This can result in serious bone fractures. Your risk for osteoporosis can be identified using a bone density scan.  If you are 63 years of age or older, or if you are at risk for osteoporosis and fractures, ask your health care provider if you should be screened.  Ask your health care provider whether you should take a calcium or vitamin D supplement to lower your risk for osteoporosis.  Menopause may have certain physical symptoms and risks.  Hormone replacement therapy may reduce some of these symptoms and risks. Talk to your health care provider about whether hormone replacement therapy is right for you. Follow these instructions at home:  Schedule  regular health, dental, and eye exams.  Stay current with your immunizations.  Do not use any tobacco products including cigarettes, chewing tobacco, or electronic cigarettes.  If you are pregnant, do not drink alcohol.  If you are breastfeeding, limit how much and how often you drink alcohol.  Limit alcohol intake to no more than 1 drink per day for nonpregnant women. One drink equals 12 ounces of beer, 5 ounces of wine, or 1 ounces of hard liquor.  Do not use street drugs.  Do not share needles.  Ask your health care provider for help if you need support or information about quitting drugs.  Tell your health care provider if you often feel depressed.  Tell your health care provider if you have ever been abused or do not feel safe at home. This information is not intended to replace advice given to you by your health care provider. Make sure you discuss any questions you have with your health care provider. Document Released: 04/27/2011 Document Revised: 03/19/2016 Document Reviewed: 07/16/2015 Elsevier Interactive Patient Education  2018 [place] in the Home Falls can cause injuries. They can happen to people of all ages. There are many things you can do to make your home safe and to help prevent falls. What can I do on the outside of my home?  Regularly fix the edges of walkways and driveways and fix any cracks.  Remove anything that might make you trip as you walk through a door, such as a raised step or threshold.  Trim any bushes or trees on the path to your home.  Use bright outdoor lighting.  Clear any walking paths of anything that might make someone trip, such as rocks or tools.  Regularly check to see if handrails are loose or broken. Make sure that both sides of any steps have handrails.  Any raised decks and  porches should have guardrails on the edges.  Have any leaves, snow, or ice cleared regularly.  Use sand or salt on walking  paths during winter.  Clean up any spills in your garage right away. This includes oil or grease spills. What can I do in the bathroom?  Use night lights.  Install grab bars by the toilet and in the tub and shower. Do not use towel bars as grab bars.  Use non-skid mats or decals in the tub or shower.  If you need to sit down in the shower, use a plastic, non-slip stool.  Keep the floor dry. Clean up any water that spills on the floor as soon as it happens.  Remove soap buildup in the tub or shower regularly.  Attach bath mats securely with double-sided non-slip rug tape.  Do not have throw rugs and other things on the floor that can make you trip. What can I do in the bedroom?  Use night lights.  Make sure that you have a light by your bed that is easy to reach.  Do not use any sheets or blankets that are too big for your bed. They should not hang down onto the floor.  Have a firm chair that has side arms. You can use this for support while you get dressed.  Do not have throw rugs and other things on the floor that can make you trip. What can I do in the kitchen?  Clean up any spills right away.  Avoid walking on wet floors.  Keep items that you use a lot in easy-to-reach places.  If you need to reach something above you, use a strong step stool that has a grab bar.  Keep electrical cords out of the way.  Do not use floor polish or wax that makes floors slippery. If you must use wax, use non-skid floor wax.  Do not have throw rugs and other things on the floor that can make you trip. What can I do with my stairs?  Do not leave any items on the stairs.  Make sure that there are handrails on both sides of the stairs and use them. Fix handrails that are broken or loose. Make sure that handrails are as long as the stairways.  Check any carpeting to make sure that it is firmly attached to the stairs. Fix any carpet that is loose or worn.  Avoid having throw rugs at  the top or bottom of the stairs. If you do have throw rugs, attach them to the floor with carpet tape.  Make sure that you have a light switch at the top of the stairs and the bottom of the stairs. If you do not have them, ask someone to add them for you. What else can I do to help prevent falls?  Wear shoes that: ? Do not have high heels. ? Have rubber bottoms. ? Are comfortable and fit you well. ? Are closed at the toe. Do not wear sandals.  If you use a stepladder: ? Make sure that it is fully opened. Do not climb a closed stepladder. ? Make sure that both sides of the stepladder are locked into place. ? Ask someone to hold it for you, if possible.  Clearly mark and make sure that you can see: ? Any grab bars or handrails. ? First and last steps. ? Where the edge of each step is.  Use tools that help you move around (mobility aids) if they are needed. These  include: ? Canes. ? Walkers. ? Scooters. ? Crutches.  Turn on the lights when you go into a dark area. Replace any light bulbs as soon as they burn out.  Set up your furniture so you have a clear path. Avoid moving your furniture around.  If any of your floors are uneven, fix them.  If there are any pets around you, be aware of where they are.  Review your medicines with your doctor. Some medicines can make you feel dizzy. This can increase your chance of falling. Ask your doctor what other things that you can do to help prevent falls. This information is not intended to replace advice given to you by your health care provider. Make sure you discuss any questions you have with your health care provider. Document Released: 08/08/2009 Document Revised: 03/19/2016 Document Reviewed: 11/16/2014 Elsevier Interactive Patient Education  2018 [employer].   Will recheck tonight and tomorrow am and let Roselyn Reef know what it is   Shingrix is a vaccine for the prevention of Shingles in Adults 50 and older.  If you are on  Medicare, the shingrix is covered under your Part D plan, so you will take both of the vaccines in the series at your pharmacy. Please check with your benefits regarding applicable copays or out of pocket expenses.  The Shingrix is given in 2 vaccines approx 8 weeks apart. You must receive the 2nd dose prior to 6 months from receipt of the first. Please have the pharmacist print out you Immunization  dates for our office records    These are the goals we discussed: Goals    . Patient Stated     Will work another year Take time off and plan to take a week off q 4 months        This is a list of the screening recommended for you and due dates:  Health Maintenance  Topic Date Due  . Colon Cancer Screening  03/31/2016  . Flu Shot  05/26/2018  . Mammogram  09/01/2018  . Tetanus Vaccine  01/06/2024  . DEXA scan (bone density measurement)  Completed  .  Hepatitis C: One time screening is recommended by Center for Disease Control  (CDC) for  adults born from [birth year] through 1965.   Completed  . Pneumonia vaccines  Completed   '

## 2018-04-25 ENCOUNTER — Telehealth: Payer: Self-pay | Admitting: Family Medicine

## 2018-04-25 NOTE — Telephone Encounter (Signed)
I dont see many great options - she has EXTENSIVE allergy list and most things I would use she is allergic to- I would have her come in for a visit to discuss benefits/risk of these medicines as well as developing back up plan if has reaction.   She could also give the prednisone a few more days if she prefers but let her know im out Thursday and Friday from the office

## 2018-04-25 NOTE — Telephone Encounter (Unsigned)
Copied from [place] [id number] [phone]. Topic: General - Other >> Apr 25, 2018 11:44 AM Judyann Munson wrote: Reason for CRM: Patient is requesting something stronger for her head cold, Patient stated her ears are still bothering her a lot, please advise   Pharmacy: [facility], [place] - [address] [phone] (Phone) [phone] (Fax)

## 2018-04-27 ENCOUNTER — Ambulatory Visit
Admission: RE | Admit: 2018-04-27 | Discharge: 2018-04-27 | Disposition: A | Discharge status: Home / Self Care | Payer: Medicare Other | Source: Ambulatory Visit | Attending: Family Medicine | Admitting: Family Medicine

## 2018-04-27 DIAGNOSIS — Z1231 Encounter for screening mammogram for malignant neoplasm of breast: Secondary | ICD-10-CM | POA: Diagnosis not present

## 2018-04-27 NOTE — Telephone Encounter (Signed)
Called and spoke to patient who states she will wait until Monday. She will call on Monday and let us know how she is feeling

## 2018-05-03 NOTE — Progress Notes (Signed)
[name] [address] [place], [place] [address] Phone: [phone] Subjective:     CC: Neck and back pain follow-up  [id number]  Felicia Perry is a 72 y.o. female coming in with complaint of neck and back pain.  Has been seen.  Was having some worsening symptoms but patient is feeling better this time.  States that not having as much severe pain.  Patient feels that there was some anxiety with her son coming back from prison.  Now there is a plan with him starting a job and she feels that this will be beneficial.  Still concerned but feels like there is more of a concrete plan.  Seem to make the pain better.  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  Did decrease the number of houses to 12 from 47 but did fire a couple individuals were working for her feels this is helped her stress as well     Past Medical History:  Diagnosis Date  . Adenomatous polyp 11/23/2005  . Anxiety   . Chronic diastolic CHF (congestive heart failure) ([place])   . Coronary artery disease    a. HC on 08/14/16 showed RCA CTO s/p PCI, 60% LCX disease managed medically with recs to consider staged PCI if continued symptoms.   Marland Kitchen DIVERTICULOSIS, COLON 04/22/2007       . GERD (gastroesophageal reflux disease)   . History of shingles   . Hypertension   . Hypertensive heart disease   . Internal hemorrhoids   . Osteoarthritis    "knees, hands, lower back" (08/14/2016)  . PUD (peptic ulcer disease)   . Sleep apnea    a. resolved with weight loss   Past Surgical History:  Procedure Laterality Date  . BREAST EXCISIONAL BIOPSY    . BREAST SURGERY Left 1980s   Fibrous Tumors removed   . CARDIAC CATHETERIZATION N/A 08/14/2016   Procedure: Left Heart Cath and Coronary Angiography;  Surgeon: Sherren Mocha, MD;  Location: [facility];  Service: Cardiovascular;  Laterality: N/A;  . CARDIAC CATHETERIZATION N/A 08/14/2016   Procedure: Coronary Stent Intervention;   Surgeon: Sherren Mocha, MD;  Location: [facility];  Service: Cardiovascular;  Laterality: N/A;  . CATARACT EXTRACTION W/ INTRAOCULAR LENS  IMPLANT, BILATERAL Bilateral 05/2009  . CORONARY ANGIOPLASTY    . detached retina left eye     may 2019  . TUBAL LIGATION  1970s   Social History   Socioeconomic History  . Marital status: Divorced    Spouse name: Not on file  . Number of children: Not on file  . Years of education: Not on file  . Highest education level: Not on file  Occupational History  . Occupation: Education administrator  Social Needs  . Financial resource strain: Not on file  . Food insecurity:    Worry: Not on file    Inability: Not on file  . Transportation needs:    Medical: Not on file    Non-medical: Not on file  Tobacco Use  . Smoking status: Former Smoker    Packs/day: 1.00    Years: 26.00    Pack years: 26.00    Types: Cigarettes    Last attempt to quit: 1989    Years since quitting: 30.5  . Smokeless tobacco: Never Used  Substance and Sexual Activity  . Alcohol use: No  . Drug use: No  . Sexual activity: Not Currently  Lifestyle  . Physical activity:  Days per week: Not on file    Minutes per session: Not on file  . Stress: Not on file  Relationships  . Social connections:    Talks on phone: Not on file    Gets together: Not on file    Attends religious service: Not on file    Active member of club or organization: Not on file    Attends meetings of clubs or organizations: Not on file    Relationship status: Not on file  Other Topics Concern  . Not on file  Social History Narrative   Divorced for 38 years in 2016. 2 sons.  2 granddaughters.       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       Hobbies: watch tv-survivor, dancing with the stars, enjoy alone time. Best friend [name] patient of Dr. Yong Channel. Enjoys work, time on Teaching laboratory technician.    Allergies  Allergen Reactions  . Celexa [Citalopram  Hydrobromide]     psychosis  . Cephalosporins     REACTION: tongue swelling  . Clarithromycin     REACTION: blisters in mouth  . Doxycycline Hyclate     REACTION: nausea, vomiting  . Levofloxacin     REACTION: tongue swells  . Penicillins     REACTION: per patient causes rash,hives  . Rosuvastatin Other (See Comments)    Pt reports causes bilateral lower extremity muscle aches.   . Zolpidem Tartrate     REACTION: difficulty with concentration   Family History  Problem Relation Age of Onset  . Heart disease Mother   . Hypertension Mother   . Diabetes Mother   . COPD Father   . Colon cancer Neg Hx      Past medical history, social, surgical and family history all reviewed in electronic medical record.  No pertanent information unless stated regarding to the chief complaint.   Review of Systems:Review of systems updated and as accurate as of 05/04/18  No headache, visual changes, nausea, vomiting, diarrhea, constipation, dizziness, abdominal pain, skin rash, fevers, chills, night sweats, weight loss, swollen lymph nodes, body aches, joint swelling, muscle aches, chest pain, shortness of breath, mood changes.   Objective  Blood pressure (!) 150/80, pulse 73, height 5' (1.524 m), weight 161 lb (73 kg), SpO2 98 %. Systems examined below as of 05/04/18   General: No apparent distress alert and oriented x3 mood and affect normal, dressed appropriately.  HEENT: Pupils equal, extraocular movements intact  Respiratory: Patient's speak in full sentences and does not appear short of breath  Cardiovascular: No lower extremity edema, non tender, no erythema  Skin: Warm dry intact with no signs of infection or rash on extremities or on axial skeleton.  Abdomen: Soft nontender  Neuro: Cranial nerves II through XII are intact, neurovascularly intact in all extremities with 2+ DTRs and 2+ pulses.  Lymph: No lymphadenopathy of posterior or anterior cervical chain or axillae bilaterally.  Gait  normal with good balance and coordination.  MSK:  tender with full range of motion and good stability and symmetric strength and tone of shoulders, elbows, wrist, hip, knee and ankles bilaterally.  Arthritic changes noted  Neck exam does have loss of lordosis.  Some crepitus noted with range of motion.  Negative Spurling's.  Tightness of the trapezius bilaterally.  Deep tendon reflexes and grip strength intact  Osteopathic findings C2 flexed rotated and side bent right C4 flexed rotated and side bent left C6 flexed  rotated and side bent left T3 extended rotated and side bent right inhaled third rib L4 flexed rotated and side bent left  Sacrum right on right     Impression and Recommendations:     This case required medical decision making of moderate complexity.      Note: This dictation was prepared with Dragon dictation along with smaller phrase technology. Any transcriptional errors that result from this process are unintentional.

## 2018-05-04 ENCOUNTER — Ambulatory Visit: Payer: Medicare Other | Admitting: Family Medicine

## 2018-05-04 ENCOUNTER — Encounter: Payer: Self-pay | Admitting: Family Medicine

## 2018-05-04 VITALS — BP 150/80 | HR 73 | Ht 60.0 in | Wt 161.0 lb

## 2018-05-04 DIAGNOSIS — G8929 Other chronic pain: Secondary | ICD-10-CM

## 2018-05-04 DIAGNOSIS — M549 Dorsalgia, unspecified: Secondary | ICD-10-CM

## 2018-05-04 DIAGNOSIS — M5030 Other cervical disc degeneration, unspecified cervical region: Secondary | ICD-10-CM

## 2018-05-04 DIAGNOSIS — M999 Biomechanical lesion, unspecified: Secondary | ICD-10-CM | POA: Diagnosis not present

## 2018-05-04 DIAGNOSIS — H43822 Vitreomacular adhesion, left eye: Secondary | ICD-10-CM | POA: Diagnosis not present

## 2018-05-04 NOTE — Assessment & Plan Note (Signed)
Decision today to treat with OMT was based on Physical Exam  After verbal consent patient was treated with HVLA, ME, FPR techniques in cervical, thoracic, rib lumbar and sacral areas  Patient tolerated the procedure well with improvement in symptoms  Patient given exercises, stretches and lifestyle modifications  See medications in patient instructions if given  Patient will follow up in 8-12 weeks

## 2018-05-04 NOTE — Assessment & Plan Note (Signed)
Stable overall.  Discussed icing regimen and home exercises, follow-up again 4 to 8 weeks

## 2018-05-04 NOTE — Patient Instructions (Signed)
Good to see you  Ice is your friend I am proud of you and you are in a better place Bone density ordered See me again in 2 months!!!

## 2018-05-19 ENCOUNTER — Other Ambulatory Visit: Payer: Self-pay | Admitting: Family Medicine

## 2018-05-20 DIAGNOSIS — Z1211 Encounter for screening for malignant neoplasm of colon: Secondary | ICD-10-CM | POA: Diagnosis not present

## 2018-05-20 LAB — COLOGUARD: COLOGUARD: NEGATIVE

## 2018-06-03 ENCOUNTER — Encounter: Payer: Medicare Other | Admitting: Family Medicine

## 2018-06-06 ENCOUNTER — Encounter: Payer: Self-pay | Admitting: Family Medicine

## 2018-06-06 ENCOUNTER — Other Ambulatory Visit: Payer: Self-pay | Admitting: Physician Assistant

## 2018-06-21 ENCOUNTER — Other Ambulatory Visit: Payer: Self-pay | Admitting: Cardiology

## 2018-07-03 NOTE — Progress Notes (Signed)
[facility] [address] [place], [place] [address] Phone: [phone] Subjective:   [name], am serving as a scribe for Dr. Hulan Saas.   CC: Back pain follow-up  [id number]  Felicia Perry is a 72 y.o. female coming in with complaint of back pain. Pain still in the left glute that radiates into the left knee. 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 Patient has been having knee pain left, medial. Notes a constant amount of swelling in left knee.  Patient states that still able to do daily activities.  Patient would like to avoid any type of injection at this moment but just wanted me to make it aware that she is having some mild increase in worsening symptoms and pain.  Patient still very active.  Has responded well to conservative therapy overall.     Past Medical History:  Diagnosis Date  . Adenomatous polyp 11/23/2005  . Anxiety   . Chronic diastolic CHF (congestive heart failure) ([place])   . Coronary artery disease    a. HC on 08/14/16 showed RCA CTO s/p PCI, 60% LCX disease managed medically with recs to consider staged PCI if continued symptoms.   Marland Kitchen DIVERTICULOSIS, COLON 04/22/2007       . GERD (gastroesophageal reflux disease)   . History of shingles   . Hypertension   . Hypertensive heart disease   . Internal hemorrhoids   . Osteoarthritis    "knees, hands, lower back" (08/14/2016)  . PUD (peptic ulcer disease)   . Sleep apnea    a. resolved with weight loss   Past Surgical History:  Procedure Laterality Date  . BREAST EXCISIONAL BIOPSY    . BREAST SURGERY Left 1980s   Fibrous Tumors removed   . CARDIAC CATHETERIZATION N/A 08/14/2016   Procedure: Left Heart Cath and Coronary Angiography;  Surgeon: Sherren Mocha, MD;  Location: [facility];  Service: Cardiovascular;  Laterality: N/A;  . CARDIAC CATHETERIZATION N/A 08/14/2016   Procedure: Coronary Stent Intervention;  Surgeon: Sherren Mocha, MD;   Location: [facility];  Service: Cardiovascular;  Laterality: N/A;  . CATARACT EXTRACTION W/ INTRAOCULAR LENS  IMPLANT, BILATERAL Bilateral 05/2009  . CORONARY ANGIOPLASTY    . detached retina left eye     may 2019  . TUBAL LIGATION  1970s   Social History   Socioeconomic History  . Marital status: Divorced    Spouse name: Not on file  . Number of children: Not on file  . Years of education: Not on file  . Highest education level: Not on file  Occupational History  . Occupation: Education administrator  Social Needs  . Financial resource strain: Not on file  . Food insecurity:    Worry: Not on file    Inability: Not on file  . Transportation needs:    Medical: Not on file    Non-medical: Not on file  Tobacco Use  . Smoking status: Former Smoker    Packs/day: 1.00    Years: 26.00    Pack years: 26.00    Types: Cigarettes    Last attempt to quit: 1989    Years since quitting: 30.7  . Smokeless tobacco: Never Used  Substance and Sexual Activity  . Alcohol use: No  . Drug use: No  . Sexual activity: Not Currently  Lifestyle  . Physical activity:    Days per week: Not on file    Minutes per session: Not on file  .  Stress: Not on file  Relationships  . Social connections:    Talks on phone: Not on file    Gets together: Not on file    Attends religious service: Not on file    Active member of club or organization: Not on file    Attends meetings of clubs or organizations: Not on file    Relationship status: Not on file  Other Topics Concern  . Not on file  Social History Narrative   Divorced for 38 years in 2016. 2 sons.  2 granddaughters.       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       Hobbies: watch tv-survivor, dancing with the stars, enjoy alone time. Best friend [name] patient of Dr. Yong Channel. Enjoys work, time on Teaching laboratory technician.    Allergies  Allergen Reactions  . Celexa [Citalopram Hydrobromide]     psychosis  .  Cephalosporins     REACTION: tongue swelling  . Clarithromycin     REACTION: blisters in mouth  . Doxycycline Hyclate     REACTION: nausea, vomiting  . Levofloxacin     REACTION: tongue swells  . Penicillins     REACTION: per patient causes rash,hives  . Rosuvastatin Other (See Comments)    Pt reports causes bilateral lower extremity muscle aches.   . Zolpidem Tartrate     REACTION: difficulty with concentration   Family History  Problem Relation Age of Onset  . Heart disease Mother   . Hypertension Mother   . Diabetes Mother   . COPD Father   . Colon cancer Neg Hx     Current Outpatient Medications (Endocrine & Metabolic):  .  predniSONE (DELTASONE) 20 MG tablet, Take 2 pills today, then 1 pill a day for the next 4 days.  Current Outpatient Medications (Cardiovascular):  .  amLODipine (NORVASC) 10 MG tablet, TAKE ONE TABLET DAILY .  ezetimibe (ZETIA) 10 MG tablet, Take 1 tablet (10 mg total) by mouth daily. .  furosemide (LASIX) 20 MG tablet, Take 20 mg by mouth daily for one week, then take 20 mg by mouth daily as needed for lower extremity swelling thereafter. .  irbesartan (AVAPRO) 300 MG tablet, TAKE ONE TABLET BY MOUTH ONCE DAILY .  metoprolol succinate (TOPROL-XL) 50 MG 24 hr tablet, Take 50 mg by mouth daily. Take with or immediately following a meal. .  metoprolol succinate (TOPROL-XL) 50 MG 24 hr tablet, TAKE ONE TABLET DAILY WITH OR IMMEDIATELY FOLLOWING A MEAL .  nitroGLYCERIN (NITROSTAT) 0.4 MG SL tablet, Place 1 tablet (0.4 mg total) under the tongue every 5 (five) minutes as needed for chest pain.  Current Outpatient Medications (Respiratory):  .  albuterol (PROVENTIL HFA;VENTOLIN HFA) 108 (90 Base) MCG/ACT inhaler, Inhale 2 puffs into the lungs every 6 (six) hours as needed for wheezing or shortness of breath. .  budesonide-formoterol (SYMBICORT) 160-4.5 MCG/ACT inhaler, Inhale 2 puffs into the lungs 2 (two) times daily. Marland Kitchen  levocetirizine (XYZAL) 5 MG tablet,  Take 5 mg by mouth every morning.  Marland Kitchen  levocetirizine (XYZAL) 5 MG tablet, TAKE ONE TABLET EVERY MORNING  Current Outpatient Medications (Analgesics):  .  acetaminophen (TYLENOL) 325 MG tablet, Take 650 mg by mouth every 6 (six) hours as needed. Marland Kitchen  aspirin EC 81 MG tablet, Take 1 tablet (81 mg total) by mouth daily.  Current Outpatient Medications (Hematological):  Marland Kitchen  Ferrous Sulfate (IRON) 325 (65 Fe) MG TABS, Take 325 mg by  mouth daily.  Current Outpatient Medications (Other):  Marland Kitchen  ALPRAZolam (XANAX) 1 MG tablet, Take 1 tablet (1 mg total) by mouth at bedtime as needed for sleep. Marland Kitchen  co-enzyme Q-10 30 MG capsule, Take 50 mg by mouth daily. .  Omega-3 Fatty Acids (FISH OIL) 1000 MG CAPS, Take 1 capsule (1,000 mg total) by mouth daily. .  pantoprazole (PROTONIX) 40 MG tablet, TAKE ONE TABLET DAILY .  potassium chloride (K-DUR) 10 MEQ tablet, Take 10 mEq by mouth daily. .  potassium chloride (K-DUR) 10 MEQ tablet, TAKE ONE TABLET DAILY .  venlafaxine XR (EFFEXOR-XR) 75 MG 24 hr capsule, TAKE ONE CAPSULE EACH DAY WITH BREAKFAST .  Vitamin D, Ergocalciferol, (DRISDOL) 50000 units CAPS capsule, Take 2,000 Units by mouth daily.     Past medical history, social, surgical and family history all reviewed in electronic medical record.  No pertanent information unless stated regarding to the chief complaint.   Review of Systems:  No headache, visual changes, nausea, vomiting, diarrhea, constipation, dizziness, abdominal pain, skin rash, fevers, chills, night sweats, weight loss, swollen lymph nodes, body aches, joint swelling,  chest pain, shortness of breath, mood changes.  Positive muscle aches  Objective  Blood pressure (!) 150/82, pulse 75, height 5' (1.524 m), weight 157 lb (71.2 kg), SpO2 98 %.   General: No apparent distress alert and oriented x3 mood and affect normal, dressed appropriately.  HEENT: Pupils equal, extraocular movements intact  Respiratory: Patient's speak in full sentences  and does not appear short of breath  Cardiovascular: No lower extremity edema, non tender, no erythema  Skin: Warm dry intact with no signs of infection or rash on extremities or on axial skeleton.  Abdomen: Soft nontender  Neuro: Cranial nerves II through XII are intact, neurovascularly intact in all extremities with 2+ DTRs and 2+ pulses.  Lymph: No lymphadenopathy of posterior or anterior cervical chain or axillae bilaterally.  Gait normal with good balance and coordination.  MSK:  Non tender with full range of motion and good stability and symmetric strength and tone of shoulders, elbows, wrist, hip, and ankles bilaterally.  Arthritic changes of multiple joints Left knee exam shows some tenderness to palpation over the medial joint line.  No significant instability.  Mild positive Thessaly's test though.  Back exam shows some mild loss of lordosis of the lumbar spine and does have some mild degenerative scoliosis.  Tightness of the paraspinal musculature of the lumbar spine right greater than left.  More pain over the sacroiliac joints.  Osteopathic findings C6 flexed rotated and side bent left T3 extended rotated and side bent right inhaled third rib T9 extended rotated and side bent left L2 flexed rotated and side bent right Sacrum right on right     Impression and Recommendations:     This case required medical decision making of moderate complexity. The above documentation has been reviewed and is accurate and complete [name], DO       Note: This dictation was prepared with Dragon dictation along with smaller phrase technology. Any transcriptional errors that result from this process are unintentional.

## 2018-07-05 ENCOUNTER — Ambulatory Visit: Payer: Medicare Other | Admitting: Family Medicine

## 2018-07-05 ENCOUNTER — Encounter: Payer: Self-pay | Admitting: Family Medicine

## 2018-07-05 VITALS — BP 150/82 | HR 75 | Ht 60.0 in | Wt 157.0 lb

## 2018-07-05 DIAGNOSIS — Z23 Encounter for immunization: Secondary | ICD-10-CM | POA: Diagnosis not present

## 2018-07-05 DIAGNOSIS — M999 Biomechanical lesion, unspecified: Secondary | ICD-10-CM | POA: Diagnosis not present

## 2018-07-05 DIAGNOSIS — M533 Sacrococcygeal disorders, not elsewhere classified: Secondary | ICD-10-CM

## 2018-07-05 NOTE — Patient Instructions (Signed)
Good to see you  Ice is your friend Posture is key and keep it up  Flu shot today  Stay active New stretches for the knee I want to see you again in 6 weeks and if not bette rplease let me inject the knee.

## 2018-07-05 NOTE — Assessment & Plan Note (Signed)
Decision today to treat with OMT was based on Physical Exam  After verbal consent patient was treated with HVLA, ME, FPR techniques in cervical, thoracic, rib, lumbar and sacral areas  Patient tolerated the procedure well with improvement in symptoms  Patient given exercises, stretches and lifestyle modifications  See medications in patient instructions if given  Patient will follow up in 6 weeks 

## 2018-07-05 NOTE — Assessment & Plan Note (Signed)
Patient does have this and degenerative disc disease.  We discussed again about posture and ergonomics.  Patient given hip abductor strengthening exercises and encouraged him to normal a more regular basis.  Discussed ergonomics throughout the day.  Discussed proper shoes.  Continue vitamin supplementations.  Patient wants me no changes in medications.  Follow-up with me again in 6 weeks

## 2018-07-11 ENCOUNTER — Encounter: Payer: Self-pay | Admitting: Family Medicine

## 2018-07-11 ENCOUNTER — Ambulatory Visit (INDEPENDENT_AMBULATORY_CARE_PROVIDER_SITE_OTHER): Payer: Medicare Other | Admitting: Family Medicine

## 2018-07-11 VITALS — BP 144/84 | HR 69 | Temp 98.4°F | Ht 60.0 in | Wt 157.6 lb

## 2018-07-11 DIAGNOSIS — Z8639 Personal history of other endocrine, nutritional and metabolic disease: Secondary | ICD-10-CM

## 2018-07-11 DIAGNOSIS — R7989 Other specified abnormal findings of blood chemistry: Secondary | ICD-10-CM

## 2018-07-11 DIAGNOSIS — Z1283 Encounter for screening for malignant neoplasm of skin: Secondary | ICD-10-CM | POA: Diagnosis not present

## 2018-07-11 DIAGNOSIS — I10 Essential (primary) hypertension: Secondary | ICD-10-CM | POA: Diagnosis not present

## 2018-07-11 DIAGNOSIS — E782 Mixed hyperlipidemia: Secondary | ICD-10-CM | POA: Insufficient documentation

## 2018-07-11 DIAGNOSIS — I25110 Atherosclerotic heart disease of native coronary artery with unstable angina pectoris: Secondary | ICD-10-CM

## 2018-07-11 DIAGNOSIS — Z0000 Encounter for general adult medical examination without abnormal findings: Secondary | ICD-10-CM | POA: Diagnosis not present

## 2018-07-11 DIAGNOSIS — E1169 Type 2 diabetes mellitus with other specified complication: Secondary | ICD-10-CM | POA: Insufficient documentation

## 2018-07-11 DIAGNOSIS — J4540 Moderate persistent asthma, uncomplicated: Secondary | ICD-10-CM

## 2018-07-11 DIAGNOSIS — F329 Major depressive disorder, single episode, unspecified: Secondary | ICD-10-CM

## 2018-07-11 DIAGNOSIS — F32A Depression, unspecified: Secondary | ICD-10-CM

## 2018-07-11 DIAGNOSIS — E785 Hyperlipidemia, unspecified: Secondary | ICD-10-CM | POA: Insufficient documentation

## 2018-07-11 DIAGNOSIS — F5101 Primary insomnia: Secondary | ICD-10-CM

## 2018-07-11 MED ORDER — BUDESONIDE-FORMOTEROL FUMARATE 160-4.5 MCG/ACT IN AERO: 2.0000 | INHALATION_SPRAY | Freq: Two times a day (BID) | RESPIRATORY_TRACT | 11 refills | Status: DC

## 2018-07-11 MED ORDER — ALBUTEROL SULFATE HFA 108 (90 BASE) MCG/ACT IN AERS: 2.0000 | INHALATION_SPRAY | Freq: Four times a day (QID) | RESPIRATORY_TRACT | 5 refills | Status: DC | PRN

## 2018-07-11 MED ORDER — ALPRAZOLAM 1 MG PO TABS: 1.0000 mg | ORAL_TABLET | Freq: Every evening | ORAL | 2 refills | Status: DC | PRN

## 2018-07-11 MED ORDER — HYDRALAZINE HCL 10 MG PO TABS
10.0000 mg | ORAL_TABLET | Freq: Three times a day (TID) | ORAL | 5 refills | Status: DC
Start: 2018-07-11 — End: 2018-07-11

## 2018-07-11 NOTE — Assessment & Plan Note (Addendum)
S: controlled poorly on metoprolol 50mg  XR, irbesartan 300 mg, amlodipine 10 mg. BP Readings from Last 3 Encounters:  07/11/18 (!) 144/84. 154/80 on my repeat.   07/05/18 (!) 150/82  05/04/18 (!) 150/80  A/P: We discussed blood pressure goal of <140/90. Had ordered hydralazine but with CAD should avoid use unless cardiology recommends so called pharmacy to cancel. We will have to consider HCTZ if calcium is ok and remains ok- would want to do recheck in a month to make sure not elevated. Also considering imdur possibly

## 2018-07-11 NOTE — Assessment & Plan Note (Signed)
Anxiety- xanax helps with sleep significantly. Also helps depression and anxiety- effexor.   is down from 2 mg. Tried to do half (0.5 mg) but didn't work

## 2018-07-11 NOTE — Progress Notes (Signed)
Phone: [phone]  Subjective:  Patient presents today for their annual physical. Chief complaint-noted.   See problem oriented charting- ROS- full  review of systems was completed and negative except for: dental issues, joint pain, back pain, neck pain, numbness, bruising  The following were reviewed and entered/updated in epic: Past Medical History:  Diagnosis Date  . Adenomatous polyp 11/23/2005  . Anxiety   . Chronic diastolic CHF (congestive heart failure) ([place])   . Coronary artery disease    a. HC on 08/14/16 showed RCA CTO s/p PCI, 60% LCX disease managed medically with recs to consider staged PCI if continued symptoms.   Marland Kitchen DIVERTICULOSIS, COLON 04/22/2007       . GERD (gastroesophageal reflux disease)   . History of shingles   . Hypertension   . Hypertensive heart disease   . Internal hemorrhoids   . Osteoarthritis    "knees, hands, lower back" (08/14/2016)  . PUD (peptic ulcer disease)   . Sleep apnea    a. resolved with weight loss   Patient Active Problem List   Diagnosis Date Noted  . Coronary artery disease     Priority: High  . Syncope 08/04/2016    Priority: High  . H/O hyperparathyroidism 08/18/2016    Priority: Medium  . Vitamin D deficiency 08/20/2015    Priority: Medium  . Chronic pain syndrome 03/26/2015    Priority: Medium  . Insomnia 07/13/2014    Priority: Medium  . GERD (gastroesophageal reflux disease) 02/25/2011    Priority: Medium  . Depression 04/22/2007    Priority: Medium  . Essential hypertension 04/22/2007    Priority: Medium  . Asthma 04/22/2007    Priority: Medium  . Nonallopathic lesion of sacral region 07/05/2018    Priority: Low  . Nonallopathic lesion of cervical region 07/05/2018    Priority: Low  . Degenerative arthritis of left knee 09/15/2017    Priority: Low  . Hyperglycemia 08/06/2015    Priority: Low  . Xerosis of skin 08/06/2015    Priority: Low  . Hypersomnia with sleep apnea 03/26/2015    Priority: Low  .  Nonallopathic lesion of lumbosacral region 11/13/2014    Priority: Low  . Osteoarthritis of left lower extremity 10/17/2014    Priority: Low  . Chronic meniscal tear of knee 10/17/2014    Priority: Low  . Nonallopathic lesion of thoracic region 09/14/2014    Priority: Low  . Nonallopathic lesion-rib cage 08/21/2014    Priority: Low  . Tendinopathy of right rotator cuff 07/30/2014    Priority: Low  . Piriformis syndrome of right side 07/30/2014    Priority: Low  . Limited joint range of motion 07/13/2014    Priority: Low  . Rash 07/13/2014    Priority: Low  . SI (sacroiliac) joint dysfunction 10/10/2007    Priority: Low  . LOW BACK PAIN 07/01/2007    Priority: Low  . Hyperlipidemia, unspecified 07/11/2018  . DDD (degenerative disc disease), cervical 05/04/2018  . DOE (dyspnea on exertion) 09/02/2016  . PUD (peptic ulcer disease)   . Enlarged lymph nodes 05/19/2016   Past Surgical History:  Procedure Laterality Date  . BREAST EXCISIONAL BIOPSY    . BREAST SURGERY Left 1980s   Fibrous Tumors removed   . CARDIAC CATHETERIZATION N/A 08/14/2016   Procedure: Left Heart Cath and Coronary Angiography;  Surgeon: Sherren Mocha, MD;  Location: [facility];  Service: Cardiovascular;  Laterality: N/A;  . CARDIAC CATHETERIZATION N/A 08/14/2016   Procedure: Coronary Stent Intervention;  Surgeon: Sherren Mocha, MD;  Location: [facility];  Service: Cardiovascular;  Laterality: N/A;  . CATARACT EXTRACTION W/ INTRAOCULAR LENS  IMPLANT, BILATERAL Bilateral 05/2009  . CORONARY ANGIOPLASTY    . detached retina left eye     may 2019  . TUBAL LIGATION  1970s    Family History  Problem Relation Age of Onset  . Heart disease Mother   . Hypertension Mother   . Diabetes Mother   . COPD Father   . Colon cancer Neg Hx     Medications- reviewed and updated Current Outpatient Medications  Medication Sig Dispense Refill  . acetaminophen (TYLENOL) 325 MG tablet Take 650 mg by  mouth every 6 (six) hours as needed.    Marland Kitchen amLODipine (NORVASC) 10 MG tablet TAKE ONE TABLET DAILY 30 tablet 5  . aspirin EC 81 MG tablet Take 1 tablet (81 mg total) by mouth daily. 90 tablet 3  . co-enzyme Q-10 30 MG capsule Take 50 mg by mouth daily.    Marland Kitchen ezetimibe (ZETIA) 10 MG tablet Take 1 tablet (10 mg total) by mouth daily. 30 tablet 8  . Ferrous Sulfate (IRON) 325 (65 Fe) MG TABS Take 325 mg by mouth daily.    . irbesartan (AVAPRO) 300 MG tablet TAKE ONE TABLET BY MOUTH ONCE DAILY 90 tablet 1  . levocetirizine (XYZAL) 5 MG tablet Take 5 mg by mouth every morning.     . metoprolol succinate (TOPROL-XL) 50 MG 24 hr tablet Take 50 mg by mouth daily. Take with or immediately following a meal.    . Omega-3 Fatty Acids (FISH OIL) 1000 MG CAPS Take 1 capsule (1,000 mg total) by mouth daily. 90 capsule 2  . pantoprazole (PROTONIX) 40 MG tablet TAKE ONE TABLET DAILY 90 tablet 1  . potassium chloride (K-DUR) 10 MEQ tablet Take 10 mEq by mouth daily.    Marland Kitchen venlafaxine XR (EFFEXOR-XR) 75 MG 24 hr capsule TAKE ONE CAPSULE EACH DAY WITH BREAKFAST 90 capsule 1  . Vitamin D, Ergocalciferol, (DRISDOL) 50000 units CAPS capsule Take 2,000 Units by mouth daily.     Marland Kitchen albuterol (PROVENTIL HFA;VENTOLIN HFA) 108 (90 Base) MCG/ACT inhaler Inhale 2 puffs into the lungs every 6 (six) hours as needed for wheezing or shortness of breath. 1 Inhaler 5  . ALPRAZolam (XANAX) 1 MG tablet Take 1 tablet (1 mg total) by mouth at bedtime as needed for sleep. 30 tablet 2  . budesonide-formoterol (SYMBICORT) 160-4.5 MCG/ACT inhaler Inhale 2 puffs into the lungs 2 (two) times daily. 1 Inhaler 11  . nitroGLYCERIN (NITROSTAT) 0.4 MG SL tablet Place 1 tablet (0.4 mg total) under the tongue every 5 (five) minutes as needed for chest pain. 25 tablet 6   No current facility-administered medications for this visit.     Allergies-reviewed and updated Allergies  Allergen Reactions  . Celexa [Citalopram Hydrobromide]     psychosis    . Cephalosporins     REACTION: tongue swelling  . Clarithromycin     REACTION: blisters in mouth  . Doxycycline Hyclate     REACTION: nausea, vomiting  . Levofloxacin     REACTION: tongue swells  . Penicillins     REACTION: per patient causes rash,hives  . Rosuvastatin Other (See Comments)    Pt reports causes bilateral lower extremity muscle aches.   . Zolpidem Tartrate     REACTION: difficulty with concentration    Social History   Social History Narrative   Divorced for 38 years in  2016. 2 sons- 1 son had been in prison now living with her in 2019.  2 granddaughters with 1 living close.        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       Hobbies: watch tv-survivor, dancing with the stars, enjoy alone time. Best friend [name] patient of Dr. Yong Channel. Enjoys work, time on Teaching laboratory technician.     Objective: BP (!) 144/84 (BP Location: Left Arm, Patient Position: Sitting, Cuff Size: Large)   Pulse 69   Temp 98.4 F (36.9 C) (Oral)   Ht 5' (1.524 m)   Wt 157 lb 9.6 oz (71.5 kg)   SpO2 99%   BMI 30.78 kg/m  Gen: NAD, resting comfortably HEENT: Mucous membranes are moist. Oropharynx normal Neck: no thyromegaly CV: RRR no murmurs rubs or gallops Lungs: CTAB no crackles, wheeze, rhonchi Abdomen: soft/nontender/nondistended/normal bowel sounds. No rebound or guarding.  Ext: no edema Skin: warm, dry Neuro: grossly normal, moves all extremities, PERRLA  BP remained high on repeat  Assessment/Plan:  72 y.o. female presenting for annual physical.  Health Maintenance counseling: 1. Anticipatory guidance: Patient counseled regarding regular dental exams -q6 months, eye exams - yearly, wearing seatbelts.  2. Risk factor reduction:  Advised patient of need for regular exercise and diet rich and fruits and vegetables to reduce risk of heart attack and stroke. Exercise- very active at work and exercises on top of that. Diet-reasonably balanced.   Wt Readings from Last 3 Encounters:  07/11/18 157 lb 9.6 oz (71.5 kg)  07/05/18 157 lb (71.2 kg)  05/04/18 161 lb (73 kg)  3. Immunizations/screenings/ancillary studies-- We discussed shingrix availability issues  as well as coverage issues (part D medicare)- I recommended that patient get vaccine at the pharmacy  Immunization History  Administered Date(s) Administered  . Influenza Split 09/21/2011, 07/07/2012  . Influenza Whole 08/10/2008, 07/30/2010  . Influenza, High Dose Seasonal PF 08/04/2016, 07/05/2018  . Influenza,inj,Quad PF,6+ Mos 07/21/2013, 08/06/2015  . Influenza-Unspecified 07/10/2014, 10/01/2017  . Pneumococcal Conjugate-13 01/05/2014  . Pneumococcal Polysaccharide-23 02/08/2015  . Td 10/26/2001  . Tetanus 01/05/2014  4. Cervical cancer screening- passed age based screening recommendations 5. Breast cancer screening-  breast exam monthly and declines exam with me and mammogram 04/27/18 6. Colon cancer screening -cologuard 05/20/18 with 3 year repeat 7. Skin cancer screening- will refer to dermatology for skin cancer screening. advised regular sunscreen use. Denies worrisome, changing, or new skin lesions.  8. Birth control/STD check- postmenopausal. Not active.  9. Osteoporosis screening at 40- osteopenia 2015- she asks for repeat exam- ordered today by Jamie 10. Former smoker- quit at 79  Status of chronic or acute concerns   Hyperglycemia issues resolved after weight loss and no longer at increased risk for diabetes on last check. Has kept weight down so will skip test.  Lab Results  Component Value Date   HGBA1C 5.6 08/04/2016   Essential hypertension S: controlled poorly on metoprolol 50mg  XR, irbesartan 300 mg, amlodipine 10 mg. BP Readings from Last 3 Encounters:  07/11/18 (!) 144/84. 154/80 on my repeat.   07/05/18 (!) 150/82  05/04/18 (!) 150/80  A/P: We discussed blood pressure goal of <140/90. Had ordered hydralazine but with CAD should avoid use unless  cardiology recommends so called pharmacy to cancel. We will have to consider HCTZ if calcium is ok and remains ok- would want to do recheck in a month to make sure not elevated. Also considering imdur possibly  H/O hyperparathyroidism Last saw Dr. Cruzita Lederer a few years ago- as long as vitamin D has been ok- calcium has been as well. Off hctz but we may consider restart given BP issues.   Asthma Asthma- doing well recently  when compliant with symbicort  Insomnia Anxiety- xanax helps with sleep significantly. Also helps depression and anxiety- effexor.   is down from 2 mg. Tried to do half (0.5 mg) but didn't work  Depression Depression- controlled in June on effexor 75mg . She wants to be able to cry again. We considered 37.5mg  but will hold off for now- she wants to talk to Dr. Tamala Julian  Hyperlipidemia, unspecified Hyperlipidemia- on zetia 10mg , couldn't tolerate statins. Couldn't walk due to pain on statins.  1 month BP recheck Future Appointments  Date Time Provider [place]  08/11/2018  8:20 AM Dorothy Spark, MD [facility]  08/15/2018  2:15 PM Marin Olp, MD [facility]  08/16/2018  4:00 PM [name], DO [facility]   Lab/Order associations: Screening for skin cancer - Plan: Ambulatory referral to Dermatology  Hypercalcemia  Low vitamin D level - Plan: VITAMIN D 25 Hydroxy (Vit-D Deficiency, Fractures)  Essential hypertension - Plan: CBC, Comprehensive metabolic panel, LDL cholesterol, direct  H/O hyperparathyroidism  Coronary artery disease involving native coronary artery of native heart with unstable angina pectoris (HCC)  Moderate persistent asthma without complication  Primary insomnia  Depression, unspecified depression type  Hyperlipidemia, unspecified hyperlipidemia type  Meds ordered this encounter  Medications  . budesonide-formoterol (SYMBICORT) 160-4.5 MCG/ACT inhaler    Sig: Inhale 2 puffs into the lungs 2  (two) times daily.    Dispense:  1 Inhaler    Refill:  11  . ALPRAZolam (XANAX) 1 MG tablet    Sig: Take 1 tablet (1 mg total) by mouth at bedtime as needed for sleep.    Dispense:  30 tablet    Refill:  2  . albuterol (PROVENTIL HFA;VENTOLIN HFA) 108 (90 Base) MCG/ACT inhaler    Sig: Inhale 2 puffs into the lungs every 6 (six) hours as needed for wheezing or shortness of breath.    Dispense:  1 Inhaler    Refill:  5  . DISCONTD: hydrALAZINE (APRESOLINE) 10 MG tablet    Sig: Take 1 tablet (10 mg total) by mouth 3 (three) times daily.    Dispense:  90 tablet    Refill:  5    Return precautions advised.  Garret Reddish, MD

## 2018-07-11 NOTE — Assessment & Plan Note (Signed)
Asthma- doing well recently  when compliant with symbicort

## 2018-07-11 NOTE — Assessment & Plan Note (Signed)
Depression- controlled in June on effexor 75mg . She wants to be able to cry again. We considered 37.5mg  but will hold off for now- she wants to talk to Dr. Tamala Julian

## 2018-07-11 NOTE — Assessment & Plan Note (Signed)
Last saw Dr. Cruzita Lederer a few years ago- as long as vitamin D has been ok- calcium has been as well. Off hctz but we may consider restart given BP issues.

## 2018-07-11 NOTE — Patient Instructions (Addendum)
Please check with your pharmacy to see if they have the shingrix vaccine. If they do- please get this immunization and update [place] by phone call or mychart with dates you receive the vaccine  I may send in hydrochlorothiazide again if calcium is ok and then recheck in 1 month to make sure calcium still ok  Please stop by lab before you go

## 2018-07-11 NOTE — Assessment & Plan Note (Signed)
Hyperlipidemia- on zetia 10mg , couldn't tolerate statins. Couldn't walk due to pain on statins.

## 2018-07-12 LAB — COMPREHENSIVE METABOLIC PANEL
ALBUMIN: 4.3 g/dL (ref 3.5–5.2)
ALT: 16 U/L (ref 0–35)
AST: 18 U/L (ref 0–37)
Alkaline Phosphatase: 73 U/L (ref 39–117)
BILIRUBIN TOTAL: 0.5 mg/dL (ref 0.2–1.2)
BUN: 24 mg/dL — ABNORMAL HIGH (ref 6–23)
CALCIUM: 9.8 mg/dL (ref 8.4–10.5)
CO2: 27 mEq/L (ref 19–32)
CREATININE: 0.86 mg/dL (ref 0.40–1.20)
Chloride: 102 mEq/L (ref 96–112)
GFR: 68.93 mL/min (ref >60.00)
Glucose, Bld: 89 mg/dL (ref 70–99)
Potassium: 4.6 mEq/L (ref 3.5–5.1)
Sodium: 140 mEq/L (ref 135–145)
Total Protein: 7.1 g/dL (ref 6.0–8.3)

## 2018-07-12 LAB — CBC
HCT: 42.4 % (ref 36.0–46.0)
Hemoglobin: 14.3 g/dL (ref 12.0–15.0)
MCHC: 33.7 g/dL (ref 30.0–36.0)
MCV: 88.4 fl (ref 78.0–100.0)
PLATELETS: 328 10*3/uL (ref 150.0–400.0)
RBC: 4.8 Mil/uL (ref 3.87–5.11)
RDW: 12.3 % (ref 11.5–15.5)
WBC: 11.8 10*3/uL — ABNORMAL HIGH (ref 4.0–10.5)

## 2018-07-12 LAB — VITAMIN D 25 HYDROXY (VIT D DEFICIENCY, FRACTURES): VITD: 46.01 ng/mL (ref 30.00–100.00)

## 2018-07-12 LAB — LDL CHOLESTEROL, DIRECT: Direct LDL: 68 mg/dL

## 2018-08-11 ENCOUNTER — Encounter

## 2018-08-11 ENCOUNTER — Encounter: Payer: Self-pay | Admitting: Cardiology

## 2018-08-11 ENCOUNTER — Ambulatory Visit: Payer: Medicare Other | Admitting: Cardiology

## 2018-08-11 VITALS — BP 144/80 | HR 78 | Ht 60.0 in | Wt 156.0 lb

## 2018-08-11 DIAGNOSIS — E782 Mixed hyperlipidemia: Secondary | ICD-10-CM

## 2018-08-11 DIAGNOSIS — I25110 Atherosclerotic heart disease of native coronary artery with unstable angina pectoris: Secondary | ICD-10-CM | POA: Diagnosis not present

## 2018-08-11 DIAGNOSIS — Z789 Other specified health status: Secondary | ICD-10-CM | POA: Diagnosis not present

## 2018-08-11 DIAGNOSIS — I10 Essential (primary) hypertension: Secondary | ICD-10-CM | POA: Diagnosis not present

## 2018-08-11 MED ORDER — IRBESARTAN 300 MG PO TABS: 300.0000 mg | ORAL_TABLET | Freq: Every day | ORAL | 2 refills | Status: DC

## 2018-08-11 MED ORDER — EZETIMIBE 10 MG PO TABS: 10.0000 mg | ORAL_TABLET | Freq: Every day | ORAL | 2 refills | Status: DC

## 2018-08-11 MED ORDER — MAGNESIUM 200 MG PO TABS: 200.0000 mg | ORAL_TABLET | Freq: Every day | ORAL | Status: DC

## 2018-08-11 MED ORDER — FISH OIL 1000 MG PO CAPS: 1000.0000 mg | ORAL_CAPSULE | Freq: Every day | ORAL | 2 refills | Status: DC

## 2018-08-11 MED ORDER — METOPROLOL SUCCINATE ER 50 MG PO TB24: 50.0000 mg | ORAL_TABLET | Freq: Every day | ORAL | 2 refills | Status: DC
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
## 2018-08-11 NOTE — Patient Instructions (Signed)
Medication Instructions:   STOP TAKING POTASSIUM CHLORIDE NOW  STOP TAKING IRON TABLETS NOW  START TAKING OTC MAGNESIUM 200 MG BY MOUTH DAILY  If you need a refill on your cardiac medications before your next appointment, please call your pharmacy.      You have been referred [facility] FOR CONSIDERATION OF PCSK9-INHIBITORS       Follow-Up: At [facility], you and your health needs are our priority.  As part of our continuing mission to provide you with exceptional heart care, we have created designated Provider Care Teams.  These Care Teams include your primary Cardiologist (physician) and Advanced Practice Providers (APPs -  Physician Assistants and Nurse Practitioners) who all work together to provide you with the care you need, when you need it. You will need a follow up appointment in 6 months.  Please call our office 2 months in advance to schedule this appointment.  You may see Ena Dawley, MD or one of the following Advanced Practice Providers on your designated Care Team:   Luis Llorons Torres, PA-C Melina Copa, PA-C . Ermalinda Barrios, PA-C

## 2018-08-15 ENCOUNTER — Ambulatory Visit: Payer: Medicare Other | Admitting: Family Medicine

## 2018-08-15 ENCOUNTER — Encounter: Payer: Self-pay | Admitting: Family Medicine

## 2018-08-15 DIAGNOSIS — I10 Essential (primary) hypertension: Secondary | ICD-10-CM

## 2018-08-15 NOTE — Progress Notes (Signed)
[name] [address], [place] [address] Phone: [phone] Subjective:     CC: Neck and back pain  YKD:[id number]  Felicia Perry is a 72 y.o. female coming in with complaint of back and neck pain.  Patient is following up.  Doing much better but Feels like she is doing significantly better with the knee as well.  Not having so much pain.  Patient feels like she is making progress.    Past Medical History:  Diagnosis Date  . Adenomatous polyp 11/23/2005  . Anxiety   . Chronic diastolic CHF (congestive heart failure) ([place])   . Coronary artery disease    a. HC on 08/14/16 showed RCA CTO s/p PCI, 60% LCX disease managed medically with recs to consider staged PCI if continued symptoms.   Marland Kitchen DIVERTICULOSIS, COLON 04/22/2007       . GERD (gastroesophageal reflux disease)   . History of shingles   . Hypertension   . Hypertensive heart disease   . Internal hemorrhoids   . Osteoarthritis    "knees, hands, lower back" (08/14/2016)  . PUD (peptic ulcer disease)   . Sleep apnea    a. resolved with weight loss   Past Surgical History:  Procedure Laterality Date  . BREAST EXCISIONAL BIOPSY    . BREAST SURGERY Left 1980s   Fibrous Tumors removed   . CARDIAC CATHETERIZATION N/A 08/14/2016   Procedure: Left Heart Cath and Coronary Angiography;  Surgeon: Sherren Mocha, MD;  Location: [facility];  Service: Cardiovascular;  Laterality: N/A;  . CARDIAC CATHETERIZATION N/A 08/14/2016   Procedure: Coronary Stent Intervention;  Surgeon: Sherren Mocha, MD;  Location: [facility];  Service: Cardiovascular;  Laterality: N/A;  . CATARACT EXTRACTION W/ INTRAOCULAR LENS  IMPLANT, BILATERAL Bilateral 05/2009  . CORONARY ANGIOPLASTY    . detached retina left eye     may 2019  . TUBAL LIGATION  1970s   Social History   Socioeconomic History  . Marital status: Divorced    Spouse name: Not on file  . Number of children: Not on file  . Years  of education: Not on file  . Highest education level: Not on file  Occupational History  . Occupation: Education administrator  Social Needs  . Financial resource strain: Not on file  . Food insecurity:    Worry: Not on file    Inability: Not on file  . Transportation needs:    Medical: Not on file    Non-medical: Not on file  Tobacco Use  . Smoking status: Former Smoker    Packs/day: 1.00    Years: 26.00    Pack years: 26.00    Types: Cigarettes    Last attempt to quit: 1989    Years since quitting: 30.8  . Smokeless tobacco: Never Used  Substance and Sexual Activity  . Alcohol use: No  . Drug use: No  . Sexual activity: Not Currently  Lifestyle  . Physical activity:    Days per week: Not on file    Minutes per session: Not on file  . Stress: Not on file  Relationships  . Social connections:    Talks on phone: Not on file    Gets together: Not on file    Attends religious service: Not on file    Active member of club or organization: Not on file    Attends meetings of clubs or organizations: Not on file    Relationship status: Not on  file  Other Topics Concern  . Not on file  Social History Narrative   Divorced for 39 years in 2016. 2 sons- 1 son had been in prison now living with her in 2019.  2 granddaughters with 1 living close.        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       Hobbies: watch tv-survivor, dancing with the stars, enjoy alone time. Best friend [name] patient of Dr. Yong Channel. Enjoys work, time on Teaching laboratory technician.    Allergies  Allergen Reactions  . Celexa [Citalopram Hydrobromide]     psychosis  . Cephalosporins     REACTION: tongue swelling  . Clarithromycin     REACTION: blisters in mouth  . Doxycycline Hyclate     REACTION: nausea, vomiting  . Levofloxacin     REACTION: tongue swells  . Penicillins     REACTION: per patient causes rash,hives  . Rosuvastatin Other (See Comments)    Pt reports causes bilateral  lower extremity muscle aches.   . Zolpidem Tartrate     REACTION: difficulty with concentration   Family History  Problem Relation Age of Onset  . Heart disease Mother   . Hypertension Mother   . Diabetes Mother   . COPD Father   . Colon cancer Neg Hx      Current Outpatient Medications (Cardiovascular):  .  amLODipine (NORVASC) 10 MG tablet, TAKE ONE TABLET DAILY .  ezetimibe (ZETIA) 10 MG tablet, Take 1 tablet (10 mg total) by mouth daily. .  irbesartan (AVAPRO) 300 MG tablet, Take 1 tablet (300 mg total) by mouth daily. .  metoprolol succinate (TOPROL-XL) 50 MG 24 hr tablet, Take 1 tablet (50 mg total) by mouth daily. Take with or immediately following a meal. .  nitroGLYCERIN (NITROSTAT) 0.4 MG SL tablet, Place 1 tablet (0.4 mg total) under the tongue every 5 (five) minutes as needed for chest pain.  Current Outpatient Medications (Respiratory):  .  albuterol (PROVENTIL HFA;VENTOLIN HFA) 108 (90 Base) MCG/ACT inhaler, Inhale 2 puffs into the lungs every 6 (six) hours as needed for wheezing or shortness of breath. .  budesonide-formoterol (SYMBICORT) 160-4.5 MCG/ACT inhaler, Inhale 2 puffs into the lungs 2 (two) times daily. Marland Kitchen  levocetirizine (XYZAL) 5 MG tablet, Take 5 mg by mouth every morning.   Current Outpatient Medications (Analgesics):  .  acetaminophen (TYLENOL) 325 MG tablet, Take 650 mg by mouth every 6 (six) hours as needed. Marland Kitchen  aspirin EC 81 MG tablet, Take 1 tablet (81 mg total) by mouth daily.   Current Outpatient Medications (Other):  Marland Kitchen  ALPRAZolam (XANAX) 1 MG tablet, Take 1 tablet (1 mg total) by mouth at bedtime as needed for sleep. Marland Kitchen  co-enzyme Q-10 30 MG capsule, Take 50 mg by mouth daily. .  Magnesium 200 MG TABS, Take 1 tablet (200 mg total) by mouth daily. .  Omega-3 Fatty Acids (FISH OIL) 1000 MG CAPS, Take 1 capsule (1,000 mg total) by mouth daily. .  pantoprazole (PROTONIX) 40 MG tablet, TAKE ONE TABLET DAILY .  Vitamin D, Ergocalciferol, (DRISDOL)  50000 units CAPS capsule, Take 2,000 Units by mouth daily.  Marland Kitchen  venlafaxine XR (EFFEXOR XR) 37.5 MG 24 hr capsule, Take 1 capsule (37.5 mg total) by mouth daily with breakfast.    Past medical history, social, surgical and family history all reviewed in electronic medical record.  No pertanent information unless stated regarding to the chief complaint.  Review of Systems:  No headache, visual changes, nausea, vomiting, diarrhea, constipation, dizziness, abdominal pain, skin rash, fevers, chills, night sweats, weight loss, swollen lymph nodes, body aches, joint swelling,chest pain, shortness of breath, mood changes.  Mild positive muscle aches  Objective  Blood pressure 130/82, pulse 71, height 5' (1.524 m), weight 158 lb (71.7 kg), SpO2 94 %.   General: No apparent distress alert and oriented x3 mood and affect normal, dressed appropriately.  HEENT: Pupils equal, extraocular movements intact  Respiratory: Patient's speak in full sentences and does not appear short of breath  Cardiovascular: No lower extremity edema, non tender, no erythema  Skin: Warm dry intact with no signs of infection or rash on extremities or on axial skeleton.  Abdomen: Soft nontender  Neuro: Cranial nerves II through XII are intact, neurovascularly intact in all extremities with 2+ DTRs and 2+ pulses.  Lymph: No lymphadenopathy of posterior or anterior cervical chain or axillae bilaterally.  Gait normal with good balance and coordination.  MSK:  Non tender with full range of motion and good stability and symmetric strength and tone of shoulders, elbows, wrist, hip, knee and ankles bilaterally.  Moderate arthritic changes of multiple joints  Neck exam shows some mild loss of lordosis.  Mild discomfort in the paraspinal musculature lumbar spine.  Mild positive Corky Sox.  Negative straight leg test.   Osteopathic findings C2 flexed rotated and side bent right C6 flexed rotated and side bent left T3 extended rotated and  side bent right inhaled third rib T7 extended rotated and side bent right  L3 flexed rotated and side bent left  Sacrum right on right     Impression and Recommendations:     This case required medical decision making of moderate complexity. The above documentation has been reviewed and is accurate and complete [name], DO       Note: This dictation was prepared with Dragon dictation along with smaller phrase technology. Any transcriptional errors that result from this process are unintentional.

## 2018-08-15 NOTE — Patient Instructions (Signed)
Blood pressure looks great! Congrats!!! Glad you took some time to relax! Thanks for sharing about the tiny house!   Hoping the injections with the lipid clinic are a good fit for you- I have had several folks do really well with these injections and I hope you fall into that group

## 2018-08-15 NOTE — Progress Notes (Signed)
Subjective:  Felicia Perry is a 72 y.o. year old very pleasant female patient who presents for/with See problem oriented charting ROS- No chest pain or shortness of breath. No headache or blurry vision.    Past Medical History-  Patient Active Problem List   Diagnosis Date Noted  . Coronary artery disease     Priority: High  . Syncope 08/04/2016    Priority: High  . H/O hyperparathyroidism 08/18/2016    Priority: Medium  . Vitamin D deficiency 08/20/2015    Priority: Medium  . Chronic pain syndrome 03/26/2015    Priority: Medium  . Insomnia 07/13/2014    Priority: Medium  . GERD (gastroesophageal reflux disease) 02/25/2011    Priority: Medium  . Depression 04/22/2007    Priority: Medium  . Essential hypertension 04/22/2007    Priority: Medium  . Asthma 04/22/2007    Priority: Medium  . Nonallopathic lesion of sacral region 07/05/2018    Priority: Low  . Nonallopathic lesion of cervical region 07/05/2018    Priority: Low  . Degenerative arthritis of left knee 09/15/2017    Priority: Low  . Hyperglycemia 08/06/2015    Priority: Low  . Xerosis of skin 08/06/2015    Priority: Low  . Hypersomnia with sleep apnea 03/26/2015    Priority: Low  . Nonallopathic lesion of lumbosacral region 11/13/2014    Priority: Low  . Osteoarthritis of left lower extremity 10/17/2014    Priority: Low  . Chronic meniscal tear of knee 10/17/2014    Priority: Low  . Nonallopathic lesion of thoracic region 09/14/2014    Priority: Low  . Nonallopathic lesion-rib cage 08/21/2014    Priority: Low  . Tendinopathy of right rotator cuff 07/30/2014    Priority: Low  . Piriformis syndrome of right side 07/30/2014    Priority: Low  . Limited joint range of motion 07/13/2014    Priority: Low  . Rash 07/13/2014    Priority: Low  . SI (sacroiliac) joint dysfunction 10/10/2007    Priority: Low  . LOW BACK PAIN 07/01/2007    Priority: Low  . Hyperlipidemia, unspecified 07/11/2018  . DDD  (degenerative disc disease), cervical 05/04/2018  . DOE (dyspnea on exertion) 09/02/2016  . PUD (peptic ulcer disease)   . Enlarged lymph nodes 05/19/2016    Medications- reviewed and updated Current Outpatient Medications  Medication Sig Dispense Refill  . acetaminophen (TYLENOL) 325 MG tablet Take 650 mg by mouth every 6 (six) hours as needed.    Marland Kitchen albuterol (PROVENTIL HFA;VENTOLIN HFA) 108 (90 Base) MCG/ACT inhaler Inhale 2 puffs into the lungs every 6 (six) hours as needed for wheezing or shortness of breath. 1 Inhaler 5  . ALPRAZolam (XANAX) 1 MG tablet Take 1 tablet (1 mg total) by mouth at bedtime as needed for sleep. 30 tablet 2  . amLODipine (NORVASC) 10 MG tablet TAKE ONE TABLET DAILY 30 tablet 5  . aspirin EC 81 MG tablet Take 1 tablet (81 mg total) by mouth daily. 90 tablet 3  . budesonide-formoterol (SYMBICORT) 160-4.5 MCG/ACT inhaler Inhale 2 puffs into the lungs 2 (two) times daily. 1 Inhaler 11  . co-enzyme Q-10 30 MG capsule Take 50 mg by mouth daily.    Marland Kitchen ezetimibe (ZETIA) 10 MG tablet Take 1 tablet (10 mg total) by mouth daily. 90 tablet 2  . irbesartan (AVAPRO) 300 MG tablet Take 1 tablet (300 mg total) by mouth daily. 90 tablet 2  . levocetirizine (XYZAL) 5 MG tablet Take 5 mg by mouth  every morning.     . Magnesium 200 MG TABS Take 1 tablet (200 mg total) by mouth daily. 30 each   . metoprolol succinate (TOPROL-XL) 50 MG 24 hr tablet Take 1 tablet (50 mg total) by mouth daily. Take with or immediately following a meal. 90 tablet 2  . Omega-3 Fatty Acids (FISH OIL) 1000 MG CAPS Take 1 capsule (1,000 mg total) by mouth daily. 90 capsule 2  . pantoprazole (PROTONIX) 40 MG tablet TAKE ONE TABLET DAILY 90 tablet 1  . venlafaxine XR (EFFEXOR-XR) 75 MG 24 hr capsule TAKE ONE CAPSULE EACH DAY WITH BREAKFAST 90 capsule 1  . Vitamin D, Ergocalciferol, (DRISDOL) 50000 units CAPS capsule Take 2,000 Units by mouth daily.     . nitroGLYCERIN (NITROSTAT) 0.4 MG SL tablet Place 1  tablet (0.4 mg total) under the tongue every 5 (five) minutes as needed for chest pain. 25 tablet 6   No current facility-administered medications for this visit.     Objective: BP 128/80 (BP Location: Left Arm, Patient Position: Sitting, Cuff Size: Large)   Pulse 73   Temp 98.4 F (36.9 C) (Oral)   Ht 5' (1.524 m)   Wt 159 lb 6.4 oz (72.3 kg)   SpO2 97%   BMI 31.13 kg/m  Gen: NAD, resting comfortably CV: RRR  Lungs: nonlabored, normal respiratory rate Abdomen: soft/nondistended  Assessment/Plan:  Essential hypertension S: controlled on metoprolol 50 mg extended release, irbesartan 30 mg daily, amlodipine 10 mg.  Hydralazine should be avoided with her cardiac condition.  We have considered hydrochlorthiazide if calcium remains okay.  Could also consider Imdur.  She attributes better BP to being able to relax more lately. She is excited about retiring into a tiny house at [place] BP Readings from Last 3 Encounters:  08/15/18 128/80  08/11/18 (!) 144/80  07/11/18 (!) 144/84  A/P: We discussed blood pressure goal of <140/90. Continue current meds   Future Appointments  Date Time Provider [place]  08/16/2018  4:00 PM [name], DO [facility]  09/08/2018  8:30 AM [facility] PHARMAC[facility]   4-6 months verbal  Return precautions advised.  Garret Reddish, MD

## 2018-08-15 NOTE — Assessment & Plan Note (Signed)
S: controlled on metoprolol 50 mg extended release, irbesartan 30 mg daily, amlodipine 10 mg.  Hydralazine should be avoided with her cardiac condition.  We have considered hydrochlorthiazide if calcium remains okay.  Could also consider Imdur.  She attributes better BP to being able to relax more lately. She is excited about retiring into a tiny house at [place] BP Readings from Last 3 Encounters:  08/15/18 128/80  08/11/18 (!) 144/80  07/11/18 (!) 144/84  A/P: We discussed blood pressure goal of <140/90. Continue current meds

## 2018-08-16 ENCOUNTER — Encounter: Payer: Self-pay | Admitting: Family Medicine

## 2018-08-16 ENCOUNTER — Ambulatory Visit: Payer: Medicare Other | Admitting: Family Medicine

## 2018-08-16 VITALS — BP 130/82 | HR 71 | Ht 60.0 in | Wt 158.0 lb

## 2018-08-16 DIAGNOSIS — M999 Biomechanical lesion, unspecified: Secondary | ICD-10-CM | POA: Diagnosis not present

## 2018-08-16 DIAGNOSIS — G8929 Other chronic pain: Secondary | ICD-10-CM

## 2018-08-16 DIAGNOSIS — M5441 Lumbago with sciatica, right side: Secondary | ICD-10-CM | POA: Diagnosis not present

## 2018-08-16 MED ORDER — VENLAFAXINE HCL ER 37.5 MG PO CP24: 37.5000 mg | ORAL_CAPSULE | Freq: Every day | ORAL | 1 refills | Status: DC

## 2018-08-16 NOTE — Patient Instructions (Addendum)
Good to see you  Ice is your friend I am so happy you are doing better  Lets try the effexor at 37.5 mg daily and stop the 75 but keep them around.  Lets see how you do.  Keep up everything else See me again in 2 months!

## 2018-08-16 NOTE — Assessment & Plan Note (Signed)
Low back pain.  Known degenerative disc disease.  Patient has done very well though with her antianxiety medicine that I think is been helping with pain as well.  Patient wants to come off of this medication.  Patient will be turned down to 37.5 mg.  Has been on this medication soon for some time and has responded well.  We discussed though that we would like to do this very slowly over the course of several months.  Patient is in agreement with the plan and will follow-up with me again in 2 months for further evaluation and treatment.  Continue the same exercises.

## 2018-08-16 NOTE — Assessment & Plan Note (Signed)
Decision today to treat with OMT was based on Physical Exam  After verbal consent patient was treated with HVLA, ME, FPR techniques in cervical, thoracic, rib, lumbar and sacral areas  Patient tolerated the procedure well with improvement in symptoms  Patient given exercises, stretches and lifestyle modifications  See medications in patient instructions if given  Patient will follow up in 4-6 weeks 

## 2018-08-22 ENCOUNTER — Other Ambulatory Visit: Payer: Self-pay | Admitting: Family Medicine

## 2018-08-22 ENCOUNTER — Telehealth: Payer: Self-pay | Admitting: Family Medicine

## 2018-08-22 NOTE — Telephone Encounter (Signed)
Pt called back, she call pharmacy, they do have it on fill no need for the refill.  Please disregards

## 2018-08-22 NOTE — Telephone Encounter (Signed)
Copied from [place] [id number]. Topic: Quick Communication - See Telephone Encounter >> Aug 22, 2018  4:26 PM Ivar Drape wrote: CRM for notification. See Telephone encounter for: 08/22/18. Patient stated the pharmacy said they did not receive the prescription for the venlafaxine XR (EFFEXOR XR) 37.5 MG 24 hr capsule medication.  Please resend.

## 2018-09-08 ENCOUNTER — Ambulatory Visit (INDEPENDENT_AMBULATORY_CARE_PROVIDER_SITE_OTHER): Payer: Medicare Other | Admitting: Pharmacist

## 2018-09-08 DIAGNOSIS — E785 Hyperlipidemia, unspecified: Secondary | ICD-10-CM | POA: Diagnosis not present

## 2018-09-08 MED ORDER — OMEGA-3-ACID ETHYL ESTERS 1 G PO CAPS: 2.0000 g | ORAL_CAPSULE | Freq: Every day | ORAL | 11 refills | Status: DC

## 2018-09-08 NOTE — Patient Instructions (Addendum)
Stop taking your CoQ10  Increase your fish oil to 2 grams daily - I sent a refill in to your pharmacy  Continue taking Zetia  Your LDL goal is < 70  Bempedoic acid is coming out in February of 2020 - this is another pill option that will lower your LDL 20%  The injectable cholesterol medications are Praluent and Repatha - call Daivion Pape in lipid clinic if you would like to start on either of these medications [phone]

## 2018-09-08 NOTE — Progress Notes (Signed)
Patient ID: Felicia Perry                 DOB: [birth year]                    MRN: [id number]     HPI: Felicia Perry is a 72 y.o. female patient referred to lipid clinic by Dr Meda Coffee. PMH is significant for CAD dx 2017 - RCA CTO s/p complex PCI, 60% LCx disease, HFpEF, HTN, PUD, asthma, and anxiety.   Pt presents today in good spirits. She previously tried low dose rosuvastatin, pravastatin, and atorvastatin but experienced bilateral muscle aches in her legs. She is currently tolerating Zetia well. 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 She has tried to improve her diet over the year and eats vegetables and lean meat.  Current Medications: Zetia, fish oil 1g daily, CoQ10 50mg  daily Intolerances: rosuvastatin 5mg  3x a week, atorvastatin 80mg  daily, pravastatin 20mg  daily - muscle cramping Risk Factors: CAD s/p PCI LDL goal: 70mg /dL  Diet:  Breakfast - Boost in the morning Lunch - sandwich or grilled chicken Dinner - minimal meat - sometimes chicken, [place], or fish. Loves vegetables. Snacks - Has a sweet tooth but tries to avoid having sweets in the house. Ritz crackers with peanut butter. Drinks water.  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  Family History: Mother with heart disease, HTN, and DM.  Social History: Former smoker 1 PPD for 26 years, quit in 1989. Denies alcohol and illicit drug use.  Labs: 07/11/18: direct LDL 68 02/03/18: TC 145, TG 182, HDL 38, LDL 71  Past Medical History:  Diagnosis Date  . Adenomatous polyp 11/23/2005  . Anxiety   . Chronic diastolic CHF (congestive heart failure) ([place])   . Coronary artery disease    a. HC on 08/14/16 showed RCA CTO s/p PCI, 60% LCX disease managed medically with recs to consider staged PCI if continued symptoms.   Marland Kitchen DIVERTICULOSIS, COLON 04/22/2007       . GERD (gastroesophageal reflux disease)   . History of shingles   . Hypertension   . Hypertensive heart disease   . Internal  hemorrhoids   . Osteoarthritis    "knees, hands, lower back" (08/14/2016)  . PUD (peptic ulcer disease)   . Sleep apnea    a. resolved with weight loss    Current Outpatient Medications on File Prior to Visit  Medication Sig Dispense Refill  . acetaminophen (TYLENOL) 325 MG tablet Take 650 mg by mouth every 6 (six) hours as needed.    Marland Kitchen albuterol (PROVENTIL HFA;VENTOLIN HFA) 108 (90 Base) MCG/ACT inhaler Inhale 2 puffs into the lungs every 6 (six) hours as needed for wheezing or shortness of breath. 1 Inhaler 5  . ALPRAZolam (XANAX) 1 MG tablet Take 1 tablet (1 mg total) by mouth at bedtime as needed for sleep. 30 tablet 2  . amLODipine (NORVASC) 10 MG tablet TAKE ONE TABLET DAILY 30 tablet 5  . aspirin EC 81 MG tablet Take 1 tablet (81 mg total) by mouth daily. 90 tablet 3  . budesonide-formoterol (SYMBICORT) 160-4.5 MCG/ACT inhaler Inhale 2 puffs into the lungs 2 (two) times daily. 1 Inhaler 11  . co-enzyme Q-10 30 MG capsule Take 50 mg by mouth daily.    Marland Kitchen ezetimibe (ZETIA) 10 MG tablet Take 1 tablet (10 mg total) by mouth daily. 90 tablet 2  . irbesartan (AVAPRO) 300 MG tablet Take 1 tablet (  300 mg total) by mouth daily. 90 tablet 2  . levocetirizine (XYZAL) 5 MG tablet Take 5 mg by mouth every morning.     . Magnesium 200 MG TABS Take 1 tablet (200 mg total) by mouth daily. 30 each   . metoprolol succinate (TOPROL-XL) 50 MG 24 hr tablet Take 1 tablet (50 mg total) by mouth daily. Take with or immediately following a meal. 90 tablet 2  . nitroGLYCERIN (NITROSTAT) 0.4 MG SL tablet Place 1 tablet (0.4 mg total) under the tongue every 5 (five) minutes as needed for chest pain. 25 tablet 6  . Omega-3 Fatty Acids (FISH OIL) 1000 MG CAPS Take 1 capsule (1,000 mg total) by mouth daily. 90 capsule 2  . pantoprazole (PROTONIX) 40 MG tablet TAKE ONE TABLET DAILY 90 tablet 1  . venlafaxine XR (EFFEXOR XR) 37.5 MG 24 hr capsule Take 1 capsule (37.5 mg total) by mouth daily with breakfast. 30 capsule  1  . Vitamin D, Ergocalciferol, (DRISDOL) 50000 units CAPS capsule Take 2,000 Units by mouth daily.      No current facility-administered medications on file prior to visit.     Allergies  Allergen Reactions  . Celexa [Citalopram Hydrobromide]     psychosis  . Cephalosporins     REACTION: tongue swelling  . Clarithromycin     REACTION: blisters in mouth  . Doxycycline Hyclate     REACTION: nausea, vomiting  . Levofloxacin     REACTION: tongue swells  . Penicillins     REACTION: per patient causes rash,hives  . Rosuvastatin Other (See Comments)    Pt reports causes bilateral lower extremity muscle aches.   . Zolpidem Tartrate     REACTION: difficulty with concentration    Assessment/Plan:  1. Hyperlipidemia - Direct LDL drawn 2 months ago is 68 at goal 70mg /dL. Unfortunately insurance will not cover PCSK9i therapy with LDL < 70. She does not wish to rechallenge with statin therapy since she is on her feet all day at work and previous myalgias were painful in her legs on statin therapy. Will continue Zetia 10mg  daily and increase Lovaza to 2g daily to target TG < 150. Will stop CoQ10 since pt is not currently taking a statin. Discussed diet extensively today in clinic, as well as potential addition of bempedoic acid once it comes to market in February 2020. Pt is very interested in the bempedoic-ezetimibe combination tablet that's expected to come to market. Will f/u with pt via telephone once this therapy is approved.   Candice Tobey E. Kathlee Barnhardt, PharmD, BCACP, [facility] [address], [place], [place] [address] Phone: [phone]; Fax: [phone] 09/08/2018 9:20 AM

## 2018-10-05 ENCOUNTER — Ambulatory Visit: Payer: Medicare Other | Admitting: Family Medicine

## 2018-10-05 ENCOUNTER — Encounter: Payer: Self-pay | Admitting: Family Medicine

## 2018-10-05 VITALS — BP 188/82 | HR 99 | Resp 18 | Wt 165.0 lb

## 2018-10-05 DIAGNOSIS — M999 Biomechanical lesion, unspecified: Secondary | ICD-10-CM | POA: Diagnosis not present

## 2018-10-05 DIAGNOSIS — G8929 Other chronic pain: Secondary | ICD-10-CM | POA: Diagnosis not present

## 2018-10-05 DIAGNOSIS — M5441 Lumbago with sciatica, right side: Secondary | ICD-10-CM

## 2018-10-05 MED ORDER — GABAPENTIN 100 MG PO CAPS: 200.0000 mg | ORAL_CAPSULE | Freq: Every day | ORAL | 3 refills | Status: DC

## 2018-10-05 NOTE — Assessment & Plan Note (Signed)
Low back pain.  Patient does have some radiation.  Patient started on gabapentin.  Patient wants to stay off the Effexor.  Attempted osteopathic manipulation with fairly good resolution of the pain.  Patient will follow-up with me again in 6 weeks

## 2018-10-05 NOTE — Progress Notes (Signed)
[facility] [address] [place], [place] [address] Phone: [phone] Subjective:     CC: Neck and back pain  [id number]  Felicia Perry is a 72 y.o. female coming in with complaint ofNeck and back pain follow-up.  Patient had been doing very well.  Patient stopped the Effexor.  Feels like she is doing relatively well without it.  Continues to have pain and actually more pain overall.  Seems to be more on the left leg.  Mild radiation down the leg.  Seems to go to the calf.  No weakness.  States that there can be a dull throbbing aching pain even at night.     Past Medical History:  Diagnosis Date  . Adenomatous polyp 11/23/2005  . Anxiety   . Chronic diastolic CHF (congestive heart failure) ([facility])   . Coronary artery disease    a. HC on 08/14/16 showed RCA CTO s/p PCI, 60% LCX disease managed medically with recs to consider staged PCI if continued symptoms.   Marland Kitchen DIVERTICULOSIS, COLON 04/22/2007       . GERD (gastroesophageal reflux disease)   . History of shingles   . Hypertension   . Hypertensive heart disease   . Internal hemorrhoids   . Osteoarthritis    "knees, hands, lower back" (08/14/2016)  . PUD (peptic ulcer disease)   . Sleep apnea    a. resolved with weight loss   Past Surgical History:  Procedure Laterality Date  . BREAST EXCISIONAL BIOPSY    . BREAST SURGERY Left 1980s   Fibrous Tumors removed   . CARDIAC CATHETERIZATION N/A 08/14/2016   Procedure: Left Heart Cath and Coronary Angiography;  Surgeon: Sherren Mocha, MD;  Location: [facility];  Service: Cardiovascular;  Laterality: N/A;  . CARDIAC CATHETERIZATION N/A 08/14/2016   Procedure: Coronary Stent Intervention;  Surgeon: Sherren Mocha, MD;  Location: [facility];  Service: Cardiovascular;  Laterality: N/A;  . CATARACT EXTRACTION W/ INTRAOCULAR LENS  IMPLANT, BILATERAL Bilateral 05/2009  . CORONARY ANGIOPLASTY    . detached retina left eye     may 2019    . TUBAL LIGATION  1970s   Social History   Socioeconomic History  . Marital status: Divorced    Spouse name: Not on file  . Number of children: Not on file  . Years of education: Not on file  . Highest education level: Not on file  Occupational History  . Occupation: Education administrator  Social Needs  . Financial resource strain: Not on file  . Food insecurity:    Worry: Not on file    Inability: Not on file  . Transportation needs:    Medical: Not on file    Non-medical: Not on file  Tobacco Use  . Smoking status: Former Smoker    Packs/day: 1.00    Years: 26.00    Pack years: 26.00    Types: Cigarettes    Last attempt to quit: 1989    Years since quitting: 30.9  . Smokeless tobacco: Never Used  Substance and Sexual Activity  . Alcohol use: No  . Drug use: No  . Sexual activity: Not Currently  Lifestyle  . Physical activity:    Days per week: Not on file    Minutes per session: Not on file  . Stress: Not on file  Relationships  . Social connections:    Talks on phone: Not on file    Gets together: Not on file  Attends religious service: Not on file    Active member of club or organization: Not on file    Attends meetings of clubs or organizations: Not on file    Relationship status: Not on file  Other Topics Concern  . Not on file  Social History Narrative   Divorced for 17 years in 2016. 2 sons- 1 son had been in prison now living with her in 2019.  2 granddaughters with 1 living close.        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       Hobbies: watch tv-survivor, dancing with the stars, enjoy alone time. Best friend [name] patient of Dr. Yong Channel. Enjoys work, time on Teaching laboratory technician.    Allergies  Allergen Reactions  . Celexa [Citalopram Hydrobromide]     psychosis  . Cephalosporins     REACTION: tongue swelling  . Clarithromycin     REACTION: blisters in mouth  . Doxycycline Hyclate     REACTION: nausea, vomiting  .  Levofloxacin     REACTION: tongue swells  . Penicillins     REACTION: per patient causes rash,hives  . Rosuvastatin Other (See Comments)    Pt reports causes bilateral lower extremity muscle aches.   . Zolpidem Tartrate     REACTION: difficulty with concentration   Family History  Problem Relation Age of Onset  . Heart disease Mother   . Hypertension Mother   . Diabetes Mother   . COPD Father   . Colon cancer Neg Hx      Current Outpatient Medications (Cardiovascular):  .  amLODipine (NORVASC) 10 MG tablet, TAKE ONE TABLET DAILY .  ezetimibe (ZETIA) 10 MG tablet, Take 1 tablet (10 mg total) by mouth daily. .  irbesartan (AVAPRO) 300 MG tablet, Take 1 tablet (300 mg total) by mouth daily. .  metoprolol succinate (TOPROL-XL) 50 MG 24 hr tablet, Take 1 tablet (50 mg total) by mouth daily. Take with or immediately following a meal. .  omega-3 acid ethyl esters (LOVAZA) 1 g capsule, Take 2 capsules (2 g total) by mouth daily. .  nitroGLYCERIN (NITROSTAT) 0.4 MG SL tablet, Place 1 tablet (0.4 mg total) under the tongue every 5 (five) minutes as needed for chest pain.  Current Outpatient Medications (Respiratory):  .  albuterol (PROVENTIL HFA;VENTOLIN HFA) 108 (90 Base) MCG/ACT inhaler, Inhale 2 puffs into the lungs every 6 (six) hours as needed for wheezing or shortness of breath. .  budesonide-formoterol (SYMBICORT) 160-4.5 MCG/ACT inhaler, Inhale 2 puffs into the lungs 2 (two) times daily. Marland Kitchen  levocetirizine (XYZAL) 5 MG tablet, Take 5 mg by mouth every morning.   Current Outpatient Medications (Analgesics):  .  acetaminophen (TYLENOL) 325 MG tablet, Take 650 mg by mouth every 6 (six) hours as needed. Marland Kitchen  aspirin EC 81 MG tablet, Take 1 tablet (81 mg total) by mouth daily.   Current Outpatient Medications (Other):  Marland Kitchen  ALPRAZolam (XANAX) 1 MG tablet, Take 1 tablet (1 mg total) by mouth at bedtime as needed for sleep. .  Magnesium 200 MG TABS, Take 1 tablet (200 mg total) by mouth  daily. .  pantoprazole (PROTONIX) 40 MG tablet, TAKE ONE TABLET DAILY .  Vitamin D, Ergocalciferol, (DRISDOL) 50000 units CAPS capsule, Take 2,000 Units by mouth daily.     Past medical history, social, surgical and family history all reviewed in electronic medical record.  No pertanent information unless stated regarding to the chief complaint.  Review of Systems:  No headache, visual changes, nausea, vomiting, diarrhea, constipation, dizziness, abdominal pain, skin rash, fevers, chills, night sweats, weight loss, swollen lymph nodes, , chest pain, shortness of breath, mood changes.  Positive muscle aches, body aches  Objective  Blood pressure (!) 188/82, pulse 99, resp. rate 18, weight 165 lb (74.8 kg), SpO2 98 %. Systems examined below as of    General: No apparent distress alert and oriented x3 mood and affect normal, dressed appropriately.  HEENT: Pupils equal, extraocular movements intact  Respiratory: Patient's speak in full sentences and does not appear short of breath  Cardiovascular: No lower extremity edema, non tender, no erythema  Skin: Warm dry intact with no signs of infection or rash on extremities or on axial skeleton.  Abdomen: Soft nontender  Neuro: Cranial nerves II through XII are intact, neurovascularly intact in all extremities with 2+ DTRs and 2+ pulses.  Lymph: No lymphadenopathy of posterior or anterior cervical chain or axillae bilaterally.  Gait mild antalgic.  MSK: Mild tender with mild limited range of motion and good stability and symmetric strength and tone of shoulders, elbows, wrist, hip, knee and ankles bilaterally.  Back exam shows some mild loss of lordosis with some mild degenerative scoliosis.  Patient does have a positive straight leg test on the left side.  5 out of 5 strength though noted.  Deep tendon reflexes intact.  Positive Faber on the left side as well.  Osteopathic findings C2 flexed rotated and side bent right C4 flexed rotated and side  bent left C7 flexed rotated and side bent left T3 extended rotated and side bent right inhaled third rib T9 extended rotated and side bent left L2 flexed rotated and side bent right Sacrum left on left    Impression and Recommendations:     This case required medical decision making of moderate complexity. The above documentation has been reviewed and is accurate and complete [name], DO       Note: This dictation was prepared with Dragon dictation along with smaller phrase technology. Any transcriptional errors that result from this process are unintentional.

## 2018-10-05 NOTE — Patient Instructions (Addendum)
Good to see you  Gabapentin 200mg  at night can start 1 pill at first if you would like  Ice is your friend Posture is so much better Keep up on the stretches  See me again in 6 weeks

## 2018-10-05 NOTE — Assessment & Plan Note (Signed)
Decision today to treat with OMT was based on Physical Exam  After verbal consent patient was treated with HVLA, ME, FPR techniques in cervical, thoracic, rib, lumbar and sacral areas  Patient tolerated the procedure well with improvement in symptoms  Patient given exercises, stretches and lifestyle modifications  See medications in patient instructions if given  Patient will follow up in 6 weeks 

## 2018-10-13 ENCOUNTER — Other Ambulatory Visit: Payer: Self-pay

## 2018-10-13 ENCOUNTER — Ambulatory Visit (HOSPITAL_COMMUNITY)
Admission: EM | Admit: 2018-10-13 | Discharge: 2018-10-13 | Disposition: A | Discharge status: Home / Self Care | Payer: Medicare Other | Attending: Internal Medicine | Admitting: Internal Medicine

## 2018-10-13 ENCOUNTER — Ambulatory Visit: Payer: Self-pay | Admitting: *Deleted

## 2018-10-13 ENCOUNTER — Encounter (HOSPITAL_COMMUNITY): Payer: Self-pay | Admitting: Emergency Medicine

## 2018-10-13 DIAGNOSIS — N179 Acute kidney failure, unspecified: Secondary | ICD-10-CM | POA: Diagnosis not present

## 2018-10-13 DIAGNOSIS — E785 Hyperlipidemia, unspecified: Secondary | ICD-10-CM | POA: Insufficient documentation

## 2018-10-13 DIAGNOSIS — M1712 Unilateral primary osteoarthritis, left knee: Secondary | ICD-10-CM | POA: Diagnosis not present

## 2018-10-13 DIAGNOSIS — Z833 Family history of diabetes mellitus: Secondary | ICD-10-CM | POA: Insufficient documentation

## 2018-10-13 DIAGNOSIS — Z8249 Family history of ischemic heart disease and other diseases of the circulatory system: Secondary | ICD-10-CM | POA: Diagnosis not present

## 2018-10-13 DIAGNOSIS — E559 Vitamin D deficiency, unspecified: Secondary | ICD-10-CM | POA: Diagnosis not present

## 2018-10-13 DIAGNOSIS — M5030 Other cervical disc degeneration, unspecified cervical region: Secondary | ICD-10-CM | POA: Diagnosis not present

## 2018-10-13 DIAGNOSIS — F329 Major depressive disorder, single episode, unspecified: Secondary | ICD-10-CM | POA: Diagnosis not present

## 2018-10-13 DIAGNOSIS — M7918 Myalgia, other site: Secondary | ICD-10-CM

## 2018-10-13 DIAGNOSIS — I110 Hypertensive heart disease with heart failure: Secondary | ICD-10-CM | POA: Insufficient documentation

## 2018-10-13 DIAGNOSIS — Z87891 Personal history of nicotine dependence: Secondary | ICD-10-CM | POA: Insufficient documentation

## 2018-10-13 DIAGNOSIS — G894 Chronic pain syndrome: Secondary | ICD-10-CM | POA: Insufficient documentation

## 2018-10-13 DIAGNOSIS — I2510 Atherosclerotic heart disease of native coronary artery without angina pectoris: Secondary | ICD-10-CM | POA: Diagnosis not present

## 2018-10-13 DIAGNOSIS — Z881 Allergy status to other antibiotic agents status: Secondary | ICD-10-CM | POA: Insufficient documentation

## 2018-10-13 DIAGNOSIS — Z880 Allergy status to penicillin: Secondary | ICD-10-CM | POA: Diagnosis not present

## 2018-10-13 DIAGNOSIS — M79605 Pain in left leg: Secondary | ICD-10-CM

## 2018-10-13 DIAGNOSIS — Z955 Presence of coronary angioplasty implant and graft: Secondary | ICD-10-CM | POA: Diagnosis not present

## 2018-10-13 DIAGNOSIS — R2241 Localized swelling, mass and lump, right lower limb: Secondary | ICD-10-CM

## 2018-10-13 DIAGNOSIS — R2242 Localized swelling, mass and lump, left lower limb: Secondary | ICD-10-CM

## 2018-10-13 DIAGNOSIS — M791 Myalgia, unspecified site: Secondary | ICD-10-CM

## 2018-10-13 DIAGNOSIS — K219 Gastro-esophageal reflux disease without esophagitis: Secondary | ICD-10-CM | POA: Insufficient documentation

## 2018-10-13 DIAGNOSIS — M79604 Pain in right leg: Secondary | ICD-10-CM | POA: Insufficient documentation

## 2018-10-13 DIAGNOSIS — Z79899 Other long term (current) drug therapy: Secondary | ICD-10-CM | POA: Diagnosis not present

## 2018-10-13 DIAGNOSIS — Z7982 Long term (current) use of aspirin: Secondary | ICD-10-CM | POA: Insufficient documentation

## 2018-10-13 LAB — POCT I-STAT, CHEM 8
BUN: 26 mg/dL — AB (ref 8–23)
Calcium, Ion: 1.17 mmol/L (ref 1.15–1.40)
Chloride: 105 mmol/L (ref 98–111)
Creatinine, Ser: 1.1 mg/dL — ABNORMAL HIGH (ref 0.44–1.00)
Glucose, Bld: 127 mg/dL — ABNORMAL HIGH (ref 70–99)
HEMATOCRIT: 41 % (ref 36.0–46.0)
HEMOGLOBIN: 13.9 g/dL (ref 12.0–15.0)
Potassium: 4.1 mmol/L (ref 3.5–5.1)
SODIUM: 141 mmol/L (ref 135–145)
TCO2: 27 mmol/L (ref 22–32)

## 2018-10-13 LAB — POCT URINALYSIS DIP (DEVICE)
BILIRUBIN URINE: NEGATIVE
GLUCOSE, UA: NEGATIVE mg/dL
HGB URINE DIPSTICK: NEGATIVE
Ketones, ur: NEGATIVE mg/dL
LEUKOCYTES UA: NEGATIVE
Nitrite: NEGATIVE
Protein, ur: NEGATIVE mg/dL
Specific Gravity, Urine: 1.015 (ref 1.005–1.030)
UROBILINOGEN UA: 0.2 mg/dL (ref 0.0–1.0)
pH: 6 (ref 5.0–8.0)

## 2018-10-13 LAB — CK: Total CK: 95 U/L (ref 38–234)

## 2018-10-13 MED ORDER — CYCLOBENZAPRINE HCL 10 MG PO TABS: 10.0000 mg | ORAL_TABLET | Freq: Two times a day (BID) | ORAL | 0 refills | Status: DC | PRN

## 2018-10-13 MED ORDER — METHYLPREDNISOLONE SODIUM SUCC 125 MG IJ SOLR
INTRAMUSCULAR | Status: AC
  Filled 2018-10-13: qty 2

## 2018-10-13 MED ORDER — METHYLPREDNISOLONE SODIUM SUCC 125 MG IJ SOLR
80.0000 mg | Freq: Once | INTRAMUSCULAR | Status: AC
  Administered 2018-10-13: 80 mg via INTRAMUSCULAR

## 2018-10-13 NOTE — Telephone Encounter (Signed)
Pt called stating that she was seen by Dr Hulan Saas a week ago; she states that her right leg buckled and she can not walk on it; she has pain behind her knee and in her calf; she states that Dr Tamala Julian told her that coming off effexor may have masked arthritic symptoms; she also said that she was given gabapentin for pain;  the pt states that she can not bear weight on that leg, and the leg is swollen from the knee to ankle; she says that her ankle is also tender to the touch; the pt also says that she has had "red bands: around her leg for 1 week ("they call it petechiae") but it is not present now; attempted to contact Dr Tamala Julian left message on voicemail [phone]; spoke with Roselyn Reef, [place] and she states that since Dr Tamala Julian has been managing this issue, that office should be contacted; explained to pt; she verbalized understanding and is awaiting return call.   Reason for Disposition . [1] SEVERE pain (e.g., excruciating, unable to do any normal activities) AND [2] not improved after 2 hours of pain medicine  Answer Assessment - Initial Assessment Questions 1. ONSET: "When did the pain start?"      10/12/18 at 1815 2. LOCATION: "Where is the pain located?"      Right leg behind knee and in her calf 3. PAIN: "How bad is the pain?"    (Scale 1-10; or mild, moderate, severe)   -  MILD (1-3): doesn't interfere with normal activities    -  MODERATE (4-7): interferes with normal activities (e.g., work or school) or awakens from sleep, limping    -  SEVERE (8-10): excruciating pain, unable to do any normal activities, unable to walk     8-10, can not bear weight due to pain  4. WORK OR EXERCISE: "Has there been any recent work or exercise that involved this part of the body?"      no 5. CAUSE: "What do you think is causing the leg pain?"     No idea 6. OTHER SYMPTOMS: "Do you have any other symptoms?" (e.g., chest pain, back pain, breathing difficulty, swelling, rash, fever, numbness,  weakness)     Swelling, right ankle tender to the touch  7. PREGNANCY: "Is there any chance you are pregnant?" "When was your last menstrual period?"     no  Protocols used: LEG PAIN-A-AH

## 2018-10-13 NOTE — Progress Notes (Signed)
[facility] [address], [place] [address] Phone: [phone] Subjective:    I [name] am serving as a Education administrator for Dr. Hulan Saas.   CC: Leg pain follow-up  [id number]  Felicia Perry is a 72 y.o. female coming in with complaint of leg pain. Wednesday night her right knee gave way. Could not walk. Was seen by Dr. Rosilyn Mings at urgent care and was told she has kidney failure. Pain isn't bad this morning. Has been experiencing body cramps in various places in her body. No longer doing Effexor. Bilateral hand numbness.   Onset- 12/18 Location- right posterior knee Duration-  Character- Pain was so bad it made her tremble   Severity-8 out of 10 .  Patient does notice that her anxiety is significantly worse with being off the Effexor.  Does not sound like she is doing well.  Think she needs that medication again.    Past Medical History:  Diagnosis Date  . Adenomatous polyp 11/23/2005  . Anxiety   . Chronic diastolic CHF (congestive heart failure) ([facility])   . Coronary artery disease    a. HC on 08/14/16 showed RCA CTO s/p PCI, 60% LCX disease managed medically with recs to consider staged PCI if continued symptoms.   Marland Kitchen DIVERTICULOSIS, COLON 04/22/2007       . GERD (gastroesophageal reflux disease)   . History of shingles   . Hypertension   . Hypertensive heart disease   . Internal hemorrhoids   . Osteoarthritis    "knees, hands, lower back" (08/14/2016)  . PUD (peptic ulcer disease)   . Sleep apnea    a. resolved with weight loss   Past Surgical History:  Procedure Laterality Date  . BREAST EXCISIONAL BIOPSY    . BREAST SURGERY Left 1980s   Fibrous Tumors removed   . CARDIAC CATHETERIZATION N/A 08/14/2016   Procedure: Left Heart Cath and Coronary Angiography;  Surgeon: Sherren Mocha, MD;  Location: [facility];  Service: Cardiovascular;  Laterality: N/A;  . CARDIAC CATHETERIZATION N/A 08/14/2016   Procedure:  Coronary Stent Intervention;  Surgeon: Sherren Mocha, MD;  Location: [facility];  Service: Cardiovascular;  Laterality: N/A;  . CATARACT EXTRACTION W/ INTRAOCULAR LENS  IMPLANT, BILATERAL Bilateral 05/2009  . CORONARY ANGIOPLASTY    . detached retina left eye     may 2019  . TUBAL LIGATION  1970s   Social History   Socioeconomic History  . Marital status: Divorced    Spouse name: Not on file  . Number of children: Not on file  . Years of education: Not on file  . Highest education level: Not on file  Occupational History  . Occupation: Education administrator  Social Needs  . Financial resource strain: Not on file  . Food insecurity:    Worry: Not on file    Inability: Not on file  . Transportation needs:    Medical: Not on file    Non-medical: Not on file  Tobacco Use  . Smoking status: Former Smoker    Packs/day: 1.00    Years: 26.00    Pack years: 26.00    Types: Cigarettes    Last attempt to quit: 1989    Years since quitting: 30.9  . Smokeless tobacco: Never Used  Substance and Sexual Activity  . Alcohol use: No  . Drug use: No  . Sexual activity: Not Currently  Lifestyle  . Physical activity:    Days per week: Not  on file    Minutes per session: Not on file  . Stress: Not on file  Relationships  . Social connections:    Talks on phone: Not on file    Gets together: Not on file    Attends religious service: Not on file    Active member of club or organization: Not on file    Attends meetings of clubs or organizations: Not on file    Relationship status: Not on file  Other Topics Concern  . Not on file  Social History Narrative   Divorced for 36 years in 2016. 2 sons- 1 son had been in prison now living with her in 2019.  2 granddaughters with 1 living close.        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       Hobbies: watch tv-survivor, dancing with the stars, enjoy alone time. Best friend [name] patient of Dr.  Yong Channel. Enjoys work, time on Teaching laboratory technician.    Allergies  Allergen Reactions  . Celexa [Citalopram Hydrobromide]     psychosis  . Cephalosporins     REACTION: tongue swelling  . Clarithromycin     REACTION: blisters in mouth  . Doxycycline Hyclate     REACTION: nausea, vomiting  . Levofloxacin     REACTION: tongue swells  . Penicillins     REACTION: per patient causes rash,hives  . Rosuvastatin Other (See Comments)    Pt reports causes bilateral lower extremity muscle aches.   . Zolpidem Tartrate     REACTION: difficulty with concentration   Family History  Problem Relation Age of Onset  . Heart disease Mother   . Hypertension Mother   . Diabetes Mother   . COPD Father   . Colon cancer Neg Hx      Current Outpatient Medications (Cardiovascular):  .  amLODipine (NORVASC) 10 MG tablet, TAKE ONE TABLET DAILY .  ezetimibe (ZETIA) 10 MG tablet, Take 1 tablet (10 mg total) by mouth daily. .  irbesartan (AVAPRO) 300 MG tablet, Take 1 tablet (300 mg total) by mouth daily. .  metoprolol succinate (TOPROL-XL) 50 MG 24 hr tablet, Take 1 tablet (50 mg total) by mouth daily. Take with or immediately following a meal. .  omega-3 acid ethyl esters (LOVAZA) 1 g capsule, Take 2 capsules (2 g total) by mouth daily. .  nitroGLYCERIN (NITROSTAT) 0.4 MG SL tablet, Place 1 tablet (0.4 mg total) under the tongue every 5 (five) minutes as needed for chest pain.  Current Outpatient Medications (Respiratory):  .  albuterol (PROVENTIL HFA;VENTOLIN HFA) 108 (90 Base) MCG/ACT inhaler, Inhale 2 puffs into the lungs every 6 (six) hours as needed for wheezing or shortness of breath. .  budesonide-formoterol (SYMBICORT) 160-4.5 MCG/ACT inhaler, Inhale 2 puffs into the lungs 2 (two) times daily. Marland Kitchen  levocetirizine (XYZAL) 5 MG tablet, Take 5 mg by mouth every morning.   Current Outpatient Medications (Analgesics):  .  acetaminophen (TYLENOL) 325 MG tablet, Take 650 mg by mouth every 6 (six) hours as needed. Marland Kitchen   aspirin EC 81 MG tablet, Take 1 tablet (81 mg total) by mouth daily.   Current Outpatient Medications (Other):  Marland Kitchen  ALPRAZolam (XANAX) 1 MG tablet, Take 1 tablet (1 mg total) by mouth at bedtime as needed for sleep. .  cyclobenzaprine (FLEXERIL) 10 MG tablet, Take 1 tablet (10 mg total) by mouth 2 (two) times daily as needed for muscle spasms. Marland Kitchen  gabapentin (NEURONTIN)  100 MG capsule, Take 2 capsules (200 mg total) by mouth at bedtime. .  Magnesium 200 MG TABS, Take 1 tablet (200 mg total) by mouth daily. .  pantoprazole (PROTONIX) 40 MG tablet, TAKE ONE TABLET DAILY .  Vitamin D, Ergocalciferol, (DRISDOL) 50000 units CAPS capsule, Take 2,000 Units by mouth daily.  Marland Kitchen  venlafaxine XR (EFFEXOR XR) 75 MG 24 hr capsule, Take 1 capsule (75 mg total) by mouth daily with breakfast.    Past medical history, social, surgical and family history all reviewed in electronic medical record.  No pertanent information unless stated regarding to the chief complaint.   Review of Systems:  No headache, visual changes, nausea, vomiting, diarrhea, constipation, dizziness, abdominal pain, skin rash, fevers, chills, night sweats, weight loss, swollen lymph nodes, , chest pain, shortness of breath, mood changes.  Positive anxiety, muscle aches, body aches  Objective  Blood pressure (!) 142/90, pulse (!) 103, height 5' (1.524 m), weight 166 lb (75.3 kg), SpO2 98 %.   General: No apparent distress alert and oriented x3 mood and affect normal, dressed appropriately.  HEENT: Pupils equal, extraocular movements intact  Respiratory: Patient's speak in full sentences and does not appear short of breath  Cardiovascular: No lower extremity edema, non tender, no erythema  Skin: Warm dry intact with no signs of infection or rash on extremities or on axial skeleton.  Abdomen: Soft nontender  Neuro: Cranial nerves II through XII are intact, neurovascularly intact in all extremities with 2+ DTRs and 2+ pulses.  Lymph: No  lymphadenopathy of posterior or anterior cervical chain or axillae bilaterally.  Gait limited walking being very cautious MSK:  tender with mild limited range of motion and good stability and symmetric strength and tone of shoulders, elbows, wrist, hip, knee and ankles bilaterally.  Exam is fairly unremarkable.  Mild increase in tenderness diffusely    Impression and Recommendations:      The above documentation has been reviewed and is accurate and complete [name], DO       Note: This dictation was prepared with Dragon dictation along with smaller phrase technology. Any transcriptional errors that result from this process are unintentional.

## 2018-10-13 NOTE — Telephone Encounter (Signed)
noted 

## 2018-10-13 NOTE — Telephone Encounter (Signed)
See note

## 2018-10-13 NOTE — Telephone Encounter (Signed)
Unable to contact sports medicine; spoke with [place] at [facility]; she verifies that Dr Yong Channel has no availability; contacted pt and notified patient and advised her to proceed to urgent care or ED; she verbalized understanding; will route to [facility] to make them aware of this encounter and pt disposition.

## 2018-10-13 NOTE — ED Provider Notes (Signed)
Felicia Perry    CSN: [id number] Arrival date & time: 10/13/18  0944     History   Chief Complaint Chief Complaint  Patient presents with  . Leg Pain    right    HPI Felicia Perry is a 72 y.o. female.   72 yo female complains of severe pain and swelling in her legs bilaterally.  States that the pain "feels like a toothache". Duration 4 days. Admits pain was milder at the beginning and in all extremities. Denies fevers. States pain worsened after her orthopedic doctor weaned her off Effexor (6 weeks ago) and started gabapentin 2 days ago.  She had been on Effexor for years. She is active; denies immobility; no recent surgeries. PMH sig for OA, CAD, HTN, Vit D deficiency.      Past Medical History:  Diagnosis Date  . Adenomatous polyp 11/23/2005  . Anxiety   . Chronic diastolic CHF (congestive heart failure) ([place])   . Coronary artery disease    a. HC on 08/14/16 showed RCA CTO s/p PCI, 60% LCX disease managed medically with recs to consider staged PCI if continued symptoms.   Marland Kitchen DIVERTICULOSIS, COLON 04/22/2007       . GERD (gastroesophageal reflux disease)   . History of shingles   . Hypertension   . Hypertensive heart disease   . Internal hemorrhoids   . Osteoarthritis    "knees, hands, lower back" (08/14/2016)  . PUD (peptic ulcer disease)   . Sleep apnea    a. resolved with weight loss    Patient Active Problem List   Diagnosis Date Noted  . Hyperlipidemia, unspecified 07/11/2018  . Nonallopathic lesion of sacral region 07/05/2018  . Nonallopathic lesion of cervical region 07/05/2018  . DDD (degenerative disc disease), cervical 05/04/2018  . Degenerative arthritis of left knee 09/15/2017  . DOE (dyspnea on exertion) 09/02/2016  . H/O hyperparathyroidism 08/18/2016  . Coronary artery disease   . PUD (peptic ulcer disease)   . Syncope 08/04/2016  . Enlarged lymph nodes 05/19/2016  . Vitamin D deficiency 08/20/2015  . Hyperglycemia 08/06/2015  .  Xerosis of skin 08/06/2015  . Hypersomnia with sleep apnea 03/26/2015  . Chronic pain syndrome 03/26/2015  . Nonallopathic lesion of lumbosacral region 11/13/2014  . Osteoarthritis of left lower extremity 10/17/2014  . Chronic meniscal tear of knee 10/17/2014  . Nonallopathic lesion of thoracic region 09/14/2014  . Nonallopathic lesion-rib cage 08/21/2014  . Tendinopathy of right rotator cuff 07/30/2014  . Piriformis syndrome of right side 07/30/2014  . Insomnia 07/13/2014  . Limited joint range of motion 07/13/2014  . Rash 07/13/2014  . GERD (gastroesophageal reflux disease) 02/25/2011  . SI (sacroiliac) joint dysfunction 10/10/2007  . LOW BACK PAIN 07/01/2007  . Depression 04/22/2007  . Essential hypertension 04/22/2007  . Asthma 04/22/2007    Past Surgical History:  Procedure Laterality Date  . BREAST EXCISIONAL BIOPSY    . BREAST SURGERY Left 1980s   Fibrous Tumors removed   . CARDIAC CATHETERIZATION N/A 08/14/2016   Procedure: Left Heart Cath and Coronary Angiography;  Surgeon: Sherren Mocha, MD;  Location: [facility];  Service: Cardiovascular;  Laterality: N/A;  . CARDIAC CATHETERIZATION N/A 08/14/2016   Procedure: Coronary Stent Intervention;  Surgeon: Sherren Mocha, MD;  Location: [facility];  Service: Cardiovascular;  Laterality: N/A;  . CATARACT EXTRACTION W/ INTRAOCULAR LENS  IMPLANT, BILATERAL Bilateral 05/2009  . CORONARY ANGIOPLASTY    . detached retina left eye  may 2019  . TUBAL LIGATION  1970s    OB History   No obstetric history on file.      Home Medications    Prior to Admission medications   Medication Sig Start Date End Date Taking? Authorizing Provider  acetaminophen (TYLENOL) 325 MG tablet Take 650 mg by mouth every 6 (six) hours as needed.   Yes [provider]  albuterol (PROVENTIL HFA;VENTOLIN HFA) 108 (90 Base) MCG/ACT inhaler Inhale 2 puffs into the lungs every 6 (six) hours as needed for wheezing or shortness  of breath. 07/11/18  Yes Marin Olp, MD  ALPRAZolam Duanne Moron) 1 MG tablet Take 1 tablet (1 mg total) by mouth at bedtime as needed for sleep. 07/11/18  Yes Marin Olp, MD  amLODipine (NORVASC) 10 MG tablet TAKE ONE TABLET DAILY 04/19/18  Yes Marin Olp, MD  aspirin EC 81 MG tablet Take 1 tablet (81 mg total) by mouth daily. 08/12/16  Yes Dorothy Spark, MD  budesonide-formoterol [facility]) 160-4.5 MCG/ACT inhaler Inhale 2 puffs into the lungs 2 (two) times daily. 07/11/18  Yes Marin Olp, MD  ezetimibe (ZETIA) 10 MG tablet Take 1 tablet (10 mg total) by mouth daily. 08/11/18  Yes Dorothy Spark, MD  gabapentin (NEURONTIN) 100 MG capsule Take 2 capsules (200 mg total) by mouth at bedtime. 10/05/18  Yes Hulan Saas M, DO  irbesartan (AVAPRO) 300 MG tablet Take 1 tablet (300 mg total) by mouth daily. 08/11/18  Yes Dorothy Spark, MD  levocetirizine (XYZAL) 5 MG tablet Take 5 mg by mouth every morning.    Yes [provider]  Magnesium 200 MG TABS Take 1 tablet (200 mg total) by mouth daily. 08/11/18  Yes Dorothy Spark, MD  metoprolol succinate (TOPROL-XL) 50 MG 24 hr tablet Take 1 tablet (50 mg total) by mouth daily. Take with or immediately following a meal. 08/11/18  Yes Dorothy Spark, MD  omega-3 acid ethyl esters (LOVAZA) 1 g capsule Take 2 capsules (2 g total) by mouth daily. 09/08/18  Yes Dorothy Spark, MD  pantoprazole (PROTONIX) 40 MG tablet TAKE ONE TABLET DAILY 08/22/18  Yes Marin Olp, MD  Vitamin D, Ergocalciferol, (DRISDOL) 50000 units CAPS capsule Take 2,000 Units by mouth daily.    Yes [provider]  nitroGLYCERIN (NITROSTAT) 0.4 MG SL tablet Place 1 tablet (0.4 mg total) under the tongue every 5 (five) minutes as needed for chest pain. 06/18/17 08/11/18  Dorothy Spark, MD    Family History Family History  Problem Relation Age of Onset  . Heart disease Mother   . Hypertension Mother   . Diabetes  Mother   . COPD Father   . Colon cancer Neg Hx     Social History Social History   Tobacco Use  . Smoking status: Former Smoker    Packs/day: 1.00    Years: 26.00    Pack years: 26.00    Types: Cigarettes    Last attempt to quit: 1989    Years since quitting: 30.9  . Smokeless tobacco: Never Used  Substance Use Topics  . Alcohol use: No  . Drug use: No     Allergies   Celexa [citalopram hydrobromide]; Cephalosporins; Clarithromycin; Doxycycline hyclate; Levofloxacin; Penicillins; Rosuvastatin; and Zolpidem tartrate   Review of Systems Review of Systems  Constitutional: Negative for chills and fever.  HENT: Negative for sore throat and tinnitus.   Eyes: Negative for redness.  Respiratory: Negative for cough and shortness  of breath.   Cardiovascular: Negative for chest pain and palpitations.  Gastrointestinal: Negative for abdominal pain, diarrhea, nausea and vomiting.  Genitourinary: Negative for dysuria, frequency and urgency.  Musculoskeletal: Positive for myalgias. Negative for joint swelling.  Skin: Negative for rash.       No lesions  Neurological: Negative for weakness.  Hematological: Does not bruise/bleed easily.  Psychiatric/Behavioral: Negative for suicidal ideas.     Physical Exam Triage Vital Signs ED Triage Vitals  Enc Vitals Group     BP 10/13/18 1053 123/64     Pulse Rate 10/13/18 1053 71     Resp --      Temp 10/13/18 1053 97.9 F (36.6 C)     Temp Source 10/13/18 1053 Oral     SpO2 10/13/18 1053 96 %     Weight --      Height --      Head Circumference --      Peak Flow --      Pain Score 10/13/18 1054 8     Pain Loc --      Pain Edu? --      Excl. in [place]? --    No data found.  Updated Vital Signs BP 123/64 (BP Location: Right Arm)   Pulse 71   Temp 97.9 F (36.6 C) (Oral)   SpO2 96%   Visual Acuity Right Eye Distance:   Left Eye Distance:   Bilateral Distance:    Right Eye Near:   Left Eye Near:    Bilateral Near:      Physical Exam Vitals signs and nursing note reviewed.  Constitutional:      General: She is not in acute distress.    Appearance: She is well-developed.  HENT:     Head: Normocephalic and atraumatic.  Eyes:     General: No scleral icterus.    Conjunctiva/sclera: Conjunctivae normal.     Pupils: Pupils are equal, round, and reactive to light.  Neck:     Musculoskeletal: Normal range of motion and neck supple.     Thyroid: No thyromegaly.     Vascular: No JVD.     Trachea: No tracheal deviation.  Cardiovascular:     Rate and Rhythm: Normal rate and regular rhythm.     Heart sounds: Normal heart sounds. No murmur. No friction rub. No gallop.   Pulmonary:     Effort: Pulmonary effort is normal.     Breath sounds: Normal breath sounds.  Abdominal:     General: Bowel sounds are normal. There is no distension.     Palpations: Abdomen is soft.     Tenderness: There is no abdominal tenderness.  Musculoskeletal: Normal range of motion.        General: Swelling (non-edematous) and tenderness (extreme pain to light palpation of calves and achilles bilaterally) present.  Lymphadenopathy:     Cervical: No cervical adenopathy.  Skin:    General: Skin is warm and dry.  Neurological:     Mental Status: She is alert and oriented to person, place, and time.     Cranial Nerves: No cranial nerve deficit.  Psychiatric:        Behavior: Behavior normal.        Thought Content: Thought content normal.        Judgment: Judgment normal.      UC Treatments / Results  Labs (all labs ordered are listed, but only abnormal results are displayed) Labs Reviewed  CK  I-STAT CHEM 8, ED  EKG None  Radiology No results found.  Procedures Procedures (including critical care time)  Medications Ordered in UC Medications - No data to display  Initial Impression / Assessment and Plan / UC Course  I have reviewed the triage vital signs and the nursing notes.  Pertinent labs & imaging  results that were available during my care of the patient were reviewed by me and considered in my medical decision making (see chart for details).     Myalgias but not c/w polymyagia rheumatica, polymyositis, or dermatomyositis. Patient has h/o statin-induced myositis but she has not been on any HMG-CoA inhibitors for years (she takes ezetimibe instead). Her cariologist also took her off CoQ-10 and started fish oil. Unsure if there is a common mechanism of action between statin myalgia and norepinephine/SSRI but advised restarting CoQ-10. Will also discuss with PMD restarting Effexor which was started >1 year ago for pain, not depression (although she reports that her PMD was concerned about depression too).  No s/s of depression/fibromyalgia/chronic pain syndrome today.  Will give steroid IM today.    Final Clinical Impressions(s) / UC Diagnoses   Final diagnoses:  None   Discharge Instructions   None    ED Prescriptions    None     Controlled Substance Prescriptions [place] Controlled Substance Registry consulted? Not Applicable   Harrie Foreman, MD 10/13/18 1239

## 2018-10-13 NOTE — ED Triage Notes (Signed)
Pt complains of bilateral leg swelling for the last week.  Pt was seen by her PCP and he believes going off of Effexor may have caused this.  Pt states last night her right leg got numb and she has had a lot of pain in the leg.

## 2018-10-13 NOTE — Telephone Encounter (Signed)
Attempted to contact [facility] office; left message on voicemail [phone].

## 2018-10-13 NOTE — Telephone Encounter (Signed)
Spoke to pt, she is currently at [facility]. Scheduled her with Dr. Tamala Julian on 10/14/18.

## 2018-10-14 ENCOUNTER — Encounter: Payer: Self-pay | Admitting: Family Medicine

## 2018-10-14 ENCOUNTER — Ambulatory Visit: Payer: Medicare Other | Admitting: Family Medicine

## 2018-10-14 DIAGNOSIS — G894 Chronic pain syndrome: Secondary | ICD-10-CM

## 2018-10-14 MED ORDER — VENLAFAXINE HCL ER 75 MG PO CP24: 75.0000 mg | ORAL_CAPSULE | Freq: Every day | ORAL | 1 refills | Status: DC

## 2018-10-14 NOTE — Assessment & Plan Note (Signed)
Significant worsening again.  Do believe that underlying anxiety is likely contributing.  Patient encouraged to restart the Effexor.  Patient I believe was doing much better while she was on the medication.  Started at 37.5 mg daily.  Patient feels like she should be at the 75.  Discussed increasing slowly.  Patient did have some mild creatinine elevation when she was at urgent care and encourage patient to follow-up with primary care in the next 2 weeks.  Follow-up with me again as scheduled in 4 weeks

## 2018-10-14 NOTE — Patient Instructions (Signed)
Good to see you  You will do fine  I think your kidneys are fine- I want you to drink plenty of water Start the effexor again at 37.5 mg daily for 2 weeks then pick up the new prescription at 75 mg daily  I think see me again in in 4 weeks . If not feeling goo in 7-10 days call me or hunter and you probabaly need to be seen Happy holidays!

## 2018-10-21 ENCOUNTER — Other Ambulatory Visit: Payer: Self-pay | Admitting: Family Medicine

## 2018-11-15 NOTE — Progress Notes (Signed)
[name] [address] [place], [place] [address] Phone: [phone] Subjective:    I Felicia Perry am serving as a Education administrator for Dr. Hulan Saas.   CC: Back pain, neck pain follow-up  [id number]  Felicia Perry is a 73 y.o. female coming in with complaint of neck and back pain. Right leg is not doing any better. Still swollen and tight posterior. Stairs is still difficult.  Patient had an audible popping noted at one point.  Had pain over the medial aspect of the knee.  Patient has been able to work but since then has some pain with going down stairs sometimes.  Sometimes with pushing off the knee can give difficulty as well.    Past Medical History:  Diagnosis Date  . Adenomatous polyp 11/23/2005  . Anxiety   . Chronic diastolic CHF (congestive heart failure) ([place])   . Coronary artery disease    a. HC on 08/14/16 showed RCA CTO s/p PCI, 60% LCX disease managed medically with recs to consider staged PCI if continued symptoms.   Marland Kitchen DIVERTICULOSIS, COLON 04/22/2007       . GERD (gastroesophageal reflux disease)   . History of shingles   . Hypertension   . Hypertensive heart disease   . Internal hemorrhoids   . Osteoarthritis    "knees, hands, lower back" (08/14/2016)  . PUD (peptic ulcer disease)   . Sleep apnea    a. resolved with weight loss   Past Surgical History:  Procedure Laterality Date  . BREAST EXCISIONAL BIOPSY    . BREAST SURGERY Left 1980s   Fibrous Tumors removed   . CARDIAC CATHETERIZATION N/A 08/14/2016   Procedure: Left Heart Cath and Coronary Angiography;  Surgeon: Sherren Mocha, MD;  Location: [facility];  Service: Cardiovascular;  Laterality: N/A;  . CARDIAC CATHETERIZATION N/A 08/14/2016   Procedure: Coronary Stent Intervention;  Surgeon: Sherren Mocha, MD;  Location: [facility];  Service: Cardiovascular;  Laterality: N/A;  . CATARACT EXTRACTION W/ INTRAOCULAR LENS  IMPLANT, BILATERAL Bilateral 05/2009   . CORONARY ANGIOPLASTY    . detached retina left eye     may 2019  . TUBAL LIGATION  1970s   Social History   Socioeconomic History  . Marital status: Divorced    Spouse name: Not on file  . Number of children: Not on file  . Years of education: Not on file  . Highest education level: Not on file  Occupational History  . Occupation: Education administrator  Social Needs  . Financial resource strain: Not on file  . Food insecurity:    Worry: Not on file    Inability: Not on file  . Transportation needs:    Medical: Not on file    Non-medical: Not on file  Tobacco Use  . Smoking status: Former Smoker    Packs/day: 1.00    Years: 26.00    Pack years: 26.00    Types: Cigarettes    Last attempt to quit: 1989    Years since quitting: 31.0  . Smokeless tobacco: Never Used  Substance and Sexual Activity  . Alcohol use: No  . Drug use: No  . Sexual activity: Not Currently  Lifestyle  . Physical activity:    Days per week: Not on file    Minutes per session: Not on file  . Stress: Not on file  Relationships  . Social connections:    Talks on phone: Not on file    Gets  together: Not on file    Attends religious service: Not on file    Active member of club or organization: Not on file    Attends meetings of clubs or organizations: Not on file    Relationship status: Not on file  Other Topics Concern  . Not on file  Social History Narrative   Divorced for 66 years in 2016. 2 sons- 1 son had been in prison now living with her in 2019.  2 granddaughters with 1 living close.        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       Hobbies: watch tv-survivor, dancing with the stars, enjoy alone time. Best friend [name] patient of Dr. Yong Channel. Enjoys work, time on Teaching laboratory technician.    Allergies  Allergen Reactions  . Celexa [Citalopram Hydrobromide]     psychosis  . Cephalosporins     REACTION: tongue swelling  . Clarithromycin     REACTION:  blisters in mouth  . Doxycycline Hyclate     REACTION: nausea, vomiting  . Levofloxacin     REACTION: tongue swells  . Penicillins     REACTION: per patient causes rash,hives  . Rosuvastatin Other (See Comments)    Pt reports causes bilateral lower extremity muscle aches.   . Zolpidem Tartrate     REACTION: difficulty with concentration   Family History  Problem Relation Age of Onset  . Heart disease Mother   . Hypertension Mother   . Diabetes Mother   . COPD Father   . Colon cancer Neg Hx      Current Outpatient Medications (Cardiovascular):  .  amLODipine (NORVASC) 10 MG tablet, TAKE ONE TABLET DAILY .  ezetimibe (ZETIA) 10 MG tablet, Take 1 tablet (10 mg total) by mouth daily. .  irbesartan (AVAPRO) 300 MG tablet, Take 1 tablet (300 mg total) by mouth daily. .  metoprolol succinate (TOPROL-XL) 50 MG 24 hr tablet, Take 1 tablet (50 mg total) by mouth daily. Take with or immediately following a meal. .  nitroGLYCERIN (NITROSTAT) 0.4 MG SL tablet, Place 1 tablet (0.4 mg total) under the tongue every 5 (five) minutes as needed for chest pain. Marland Kitchen  omega-3 acid ethyl esters (LOVAZA) 1 g capsule, Take 2 capsules (2 g total) by mouth daily.  Current Outpatient Medications (Respiratory):  .  albuterol (PROVENTIL HFA;VENTOLIN HFA) 108 (90 Base) MCG/ACT inhaler, Inhale 2 puffs into the lungs every 6 (six) hours as needed for wheezing or shortness of breath. .  budesonide-formoterol (SYMBICORT) 160-4.5 MCG/ACT inhaler, Inhale 2 puffs into the lungs 2 (two) times daily. Marland Kitchen  levocetirizine (XYZAL) 5 MG tablet, Take 5 mg by mouth every morning.   Current Outpatient Medications (Analgesics):  .  acetaminophen (TYLENOL) 325 MG tablet, Take 650 mg by mouth every 6 (six) hours as needed. Marland Kitchen  aspirin EC 81 MG tablet, Take 1 tablet (81 mg total) by mouth daily.   Current Outpatient Medications (Other):  Marland Kitchen  ALPRAZolam (XANAX) 1 MG tablet, TAKE ONE TABLET AT BEDTIME AS NEEDED FOR SLEEP .   cyclobenzaprine (FLEXERIL) 10 MG tablet, Take 1 tablet (10 mg total) by mouth 2 (two) times daily as needed for muscle spasms. Marland Kitchen  gabapentin (NEURONTIN) 100 MG capsule, Take 2 capsules (200 mg total) by mouth at bedtime. .  Magnesium 200 MG TABS, Take 1 tablet (200 mg total) by mouth daily. .  pantoprazole (PROTONIX) 40 MG tablet, TAKE ONE TABLET DAILY .  venlafaxine XR (EFFEXOR XR) 75 MG 24 hr capsule, Take 1 capsule (75 mg total) by mouth daily with breakfast. .  Vitamin D, Ergocalciferol, (DRISDOL) 50000 units CAPS capsule, Take 2,000 Units by mouth daily.     Past medical history, social, surgical and family history all reviewed in electronic medical record.  No pertanent information unless stated regarding to the chief complaint.   Review of Systems:  No headache, visual changes, nausea, vomiting, diarrhea, constipation, dizziness, abdominal pain, skin rash, fevers, chills, night sweats, weight loss, swollen lymph nodes, body aches, joint swelling, muscle aches, chest pain, shortness of breath, mood changes.   Objective  There were no vitals taken for this visit. Systems examined below as of    General: No apparent distress alert and oriented x3 mood and affect normal, dressed appropriately.  HEENT: Pupils equal, extraocular movements intact  Respiratory: Patient's speak in full sentences and does not appear short of breath  Cardiovascular: No lower extremity edema, non tender, no erythema  Skin: Warm dry intact with no signs of infection or rash on extremities or on axial skeleton.  Abdomen: Soft nontender  Neuro: Cranial nerves II through XII are intact, neurovascularly intact in all extremities with 2+ DTRs and 2+ pulses.  Lymph: No lymphadenopathy of posterior or anterior cervical chain or axillae bilaterally.  Gait normal with good balance and coordination.  MSK:  Non tender with full range of motion and good stability and symmetric strength and tone of shoulders, elbows, wrist,  hip, and ankles bilaterally.  Right knee exam shows the patient does have some tenderness at the posterior medial aspect of the knee.  Tightness of the calf compared to the contralateral side.  Patient has a mildly positive straight leg test with worsening pain over the hamstring.  Back pain mild pain more over the right sacroiliac joint Back Exam:  Inspection: Unremarkable  Motion: Flexion 45 deg, Extension 25 deg, Side Bending to 25 deg bilaterally,  Rotation to 25 deg bilaterally  SLR laying: Negative  XSLR laying: Negative  Palpable tenderness: Tender to palpation over the right sacroiliac joint. FABER: Positive Faber. Sensory change: Gross sensation intact to all lumbar and sacral dermatomes.  Reflexes: 2+ at both patellar tendons, 2+ at achilles tendons, Babinski's downgoing.  Strength at foot  Plantar-flexion: 5/5 Dorsi-flexion: 5/5 Eversion: 5/5 Inversion: 5/5  Leg strength  Quad: 5/5 Hamstring: 5/5 Hip flexor: 5/5 Hip abductors: 5/5  Gait unremarkable.  Neck: Inspection mild loss of lordosis. No palpable stepoffs. Negative Spurling's maneuver. Full neck range of motion Grip strength and sensation normal in bilateral hands Strength good C4 to T1 distribution No sensory change to C4 to T1 Negative Hoffman sign bilaterally Reflexes normal      Impression and Recommendations:     This case required medical decision making of moderate complexity. The above documentation has been reviewed and is accurate and complete [name], DO       Note: This dictation was prepared with Dragon dictation along with smaller phrase technology. Any transcriptional errors that result from this process are unintentional.

## 2018-11-16 ENCOUNTER — Encounter: Payer: Self-pay | Admitting: Family Medicine

## 2018-11-16 ENCOUNTER — Ambulatory Visit: Payer: Medicare Other | Admitting: Family Medicine

## 2018-11-16 VITALS — BP 140/80 | HR 72 | Ht 60.0 in | Wt 160.0 lb

## 2018-11-16 DIAGNOSIS — S76319A Strain of muscle, fascia and tendon of the posterior muscle group at thigh level, unspecified thigh, initial encounter: Secondary | ICD-10-CM | POA: Insufficient documentation

## 2018-11-16 DIAGNOSIS — M79604 Pain in right leg: Secondary | ICD-10-CM | POA: Diagnosis not present

## 2018-11-16 NOTE — Patient Instructions (Signed)
Good to see you  We will get a test soon to rule out a clot in the leg  Ice is yoru friend Thigh compressoin sleeve while working or working out.  Exercises 3 times a week.  pennsaid pinkie amount topically 2 times daily as needed.  See me again in 3 weeks to make sure getting better

## 2018-11-16 NOTE — Assessment & Plan Note (Signed)
Partial tear near the insertion. Rule out any type of clot and Doppler ordered for tomorrow.  Patient warned of different signs and symptoms and when to seek medical attention.  Thigh compression given, icing regimen, topical anti-inflammatories, encourage gabapentin at night, continue the Effexor.  Follow-up again in 3 to 4 weeks

## 2018-11-17 ENCOUNTER — Ambulatory Visit (HOSPITAL_COMMUNITY)
Admission: RE | Admit: 2018-11-17 | Discharge: 2018-11-17 | Disposition: A | Discharge status: Home / Self Care | Payer: Medicare Other | Source: Ambulatory Visit | Attending: Cardiovascular Disease | Admitting: Cardiovascular Disease

## 2018-11-17 DIAGNOSIS — M79604 Pain in right leg: Secondary | ICD-10-CM | POA: Diagnosis not present

## 2018-12-07 ENCOUNTER — Encounter: Payer: Self-pay | Admitting: Family Medicine

## 2018-12-07 ENCOUNTER — Ambulatory Visit: Payer: Medicare Other | Admitting: Family Medicine

## 2018-12-07 DIAGNOSIS — S76319A Strain of muscle, fascia and tendon of the posterior muscle group at thigh level, unspecified thigh, initial encounter: Secondary | ICD-10-CM | POA: Diagnosis not present

## 2018-12-07 DIAGNOSIS — M170 Bilateral primary osteoarthritis of knee: Secondary | ICD-10-CM | POA: Diagnosis not present

## 2018-12-07 NOTE — Patient Instructions (Signed)
Good to see you  Felicia Perry is your friend Stay active Injected both knees today and I hope it all calms down quickly  See me again in 4-6 weeks

## 2018-12-07 NOTE — Progress Notes (Signed)
[facility] [address] [place], [place] [address] Phone: [phone] Subjective:   Felicia Perry, am serving as a scribe for Dr. Hulan Saas.    CC: Leg pain follow-up  MVE:[id number]   11/16/2018:  Partial tear near the insertion. Patient had a hamstring injury at that time.  Doppler was ordered and this was negative.  Update 12/07/2018:  Felicia Perry is a 73 y.o. female coming in with complaint of right leg pain. Patient states she has had some improvement. Pain occurs after a long day of being on her feet. Patient is unable to extend leg at end of night. Does have pain throughout the day but not enough pain to stop her. Does have swelling in the knee and calf as the day goes on. Has been icing.  More bilateral knee pain.  Patient's legs are more of a throbbing sensation.  Seems a little different.  Feels that the hamstring level may be improving.     Past Medical History:  Diagnosis Date  . Adenomatous polyp 11/23/2005  . Anxiety   . Chronic diastolic CHF (congestive heart failure) ([place])   . Coronary artery disease    a. HC on 08/14/16 showed RCA CTO s/p PCI, 60% LCX disease managed medically with recs to consider staged PCI if continued symptoms.   Marland Kitchen DIVERTICULOSIS, COLON 04/22/2007       . GERD (gastroesophageal reflux disease)   . History of shingles   . Hypertension   . Hypertensive heart disease   . Internal hemorrhoids   . Osteoarthritis    "knees, hands, lower back" (08/14/2016)  . PUD (peptic ulcer disease)   . Sleep apnea    a. resolved with weight loss   Past Surgical History:  Procedure Laterality Date  . BREAST EXCISIONAL BIOPSY    . BREAST SURGERY Left 1980s   Fibrous Tumors removed   . CARDIAC CATHETERIZATION N/A 08/14/2016   Procedure: Left Heart Cath and Coronary Angiography;  Surgeon: Sherren Mocha, MD;  Location: [facility];  Service: Cardiovascular;  Laterality: N/A;  . CARDIAC CATHETERIZATION N/A  08/14/2016   Procedure: Coronary Stent Intervention;  Surgeon: Sherren Mocha, MD;  Location: [facility];  Service: Cardiovascular;  Laterality: N/A;  . CATARACT EXTRACTION W/ INTRAOCULAR LENS  IMPLANT, BILATERAL Bilateral 05/2009  . CORONARY ANGIOPLASTY    . detached retina left eye     may 2019  . TUBAL LIGATION  1970s   Social History   Socioeconomic History  . Marital status: Divorced    Spouse name: Not on file  . Number of children: Not on file  . Years of education: Not on file  . Highest education level: Not on file  Occupational History  . Occupation: Education administrator  Social Needs  . Financial resource strain: Not on file  . Food insecurity:    Worry: Not on file    Inability: Not on file  . Transportation needs:    Medical: Not on file    Non-medical: Not on file  Tobacco Use  . Smoking status: Former Smoker    Packs/day: 1.00    Years: 26.00    Pack years: 26.00    Types: Cigarettes    Last attempt to quit: 1989    Years since quitting: 31.1  . Smokeless tobacco: Never Used  Substance and Sexual Activity  . Alcohol use: Perry  . Drug use: Perry  . Sexual activity: Not Currently  Lifestyle  .  Physical activity:    Days per week: Not on file    Minutes per session: Not on file  . Stress: Not on file  Relationships  . Social connections:    Talks on phone: Not on file    Gets together: Not on file    Attends religious service: Not on file    Active member of club or organization: Not on file    Attends meetings of clubs or organizations: Not on file    Relationship status: Not on file  Other Topics Concern  . Not on file  Social History Narrative   Divorced for 56 years in 2016. 2 sons- 1 son had been in prison now living with her in 2019.  2 granddaughters with 1 living close.        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       Hobbies: watch tv-survivor, dancing with the stars, enjoy alone time. Best friend [name] patient of Dr. Yong Channel. Enjoys work, time on Teaching laboratory technician.    Allergies  Allergen Reactions  . Celexa [Citalopram Hydrobromide]     psychosis  . Cephalosporins     REACTION: tongue swelling  . Clarithromycin     REACTION: blisters in mouth  . Doxycycline Hyclate     REACTION: nausea, vomiting  . Levofloxacin     REACTION: tongue swells  . Penicillins     REACTION: per patient causes rash,hives  . Rosuvastatin Other (See Comments)    Pt reports causes bilateral lower extremity muscle aches.   . Zolpidem Tartrate     REACTION: difficulty with concentration   Family History  Problem Relation Age of Onset  . Heart disease Mother   . Hypertension Mother   . Diabetes Mother   . COPD Father   . Colon cancer Neg Hx      Current Outpatient Medications (Cardiovascular):  .  amLODipine (NORVASC) 10 MG tablet, TAKE ONE TABLET DAILY .  ezetimibe (ZETIA) 10 MG tablet, Take 1 tablet (10 mg total) by mouth daily. .  irbesartan (AVAPRO) 300 MG tablet, Take 1 tablet (300 mg total) by mouth daily. .  metoprolol succinate (TOPROL-XL) 50 MG 24 hr tablet, Take 1 tablet (50 mg total) by mouth daily. Take with or immediately following a meal. .  omega-3 acid ethyl esters (LOVAZA) 1 g capsule, Take 2 capsules (2 g total) by mouth daily. .  nitroGLYCERIN (NITROSTAT) 0.4 MG SL tablet, Place 1 tablet (0.4 mg total) under the tongue every 5 (five) minutes as needed for chest pain.  Current Outpatient Medications (Respiratory):  .  albuterol (PROVENTIL HFA;VENTOLIN HFA) 108 (90 Base) MCG/ACT inhaler, Inhale 2 puffs into the lungs every 6 (six) hours as needed for wheezing or shortness of breath. .  budesonide-formoterol (SYMBICORT) 160-4.5 MCG/ACT inhaler, Inhale 2 puffs into the lungs 2 (two) times daily. Marland Kitchen  levocetirizine (XYZAL) 5 MG tablet, Take 5 mg by mouth every morning.   Current Outpatient Medications (Analgesics):  .  acetaminophen (TYLENOL) 325 MG tablet, Take 650 mg by mouth every 6  (six) hours as needed. Marland Kitchen  aspirin EC 81 MG tablet, Take 1 tablet (81 mg total) by mouth daily.   Current Outpatient Medications (Other):  Marland Kitchen  ALPRAZolam (XANAX) 1 MG tablet, TAKE ONE TABLET AT BEDTIME AS NEEDED FOR SLEEP .  cyclobenzaprine (FLEXERIL) 10 MG tablet, Take 1 tablet (10 mg total) by mouth 2 (two) times daily as needed for muscle spasms. Marland Kitchen  gabapentin (NEURONTIN) 100 MG capsule, Take 2 capsules (200 mg total) by mouth at bedtime. .  Magnesium 200 MG TABS, Take 1 tablet (200 mg total) by mouth daily. .  pantoprazole (PROTONIX) 40 MG tablet, TAKE ONE TABLET DAILY .  venlafaxine XR (EFFEXOR XR) 75 MG 24 hr capsule, Take 1 capsule (75 mg total) by mouth daily with breakfast. .  Vitamin D, Ergocalciferol, (DRISDOL) 50000 units CAPS capsule, Take 2,000 Units by mouth daily.     Past medical history, social, surgical and family history all reviewed in electronic medical record.  Perry pertanent information unless stated regarding to the chief complaint.   Review of Systems:  Perry headache, visual changes, nausea, vomiting, diarrhea, constipation, dizziness, abdominal pain, skin rash, fevers, chills, night sweats, weight loss, swollen lymph nodes, body aches, joint swelling, muscle aches, chest pain, shortness of breath, mood changes.   Objective  Blood pressure 110/80, pulse 80, height 5' (1.524 m), weight 161 lb (73 kg), SpO2 98 %.     General: Perry apparent distress alert and oriented x3 mood and affect normal, dressed appropriately.  HEENT: Pupils equal, extraocular movements intact  Respiratory: Patient's speak in full sentences and does not appear short of breath  Cardiovascular: Perry lower extremity edema, non tender, Perry erythema  Skin: Warm dry intact with Perry signs of infection or rash on extremities or on axial skeleton.  Abdomen: Soft nontender  Neuro: Cranial nerves II through XII are intact, neurovascularly intact in all extremities with 2+ DTRs and 2+ pulses.  Lymph: Perry  lymphadenopathy of posterior or anterior cervical chain or axillae bilaterally.  Gait antalgic MSK: Patient's right hamstring is still mildly tender at its insertion on the medial aspect of the knee.  Does have full range of motion.  More pain in the medial aspect of the knees bilaterally.  Positive patellar grind.  Patient does have some mild varus deformity of the knees bilaterally.  Does have some mild instability with valgus force bilaterally.  After informed written and verbal consent, patient was seated on exam table. Right knee was prepped with alcohol swab and utilizing anterolateral approach, patient's right knee space was injected with 4:1  marcaine 0.5%: Kenalog 40mg /dL. Patient tolerated the procedure well without immediate complications.  After informed written and verbal consent, patient was seated on exam table. Left knee was prepped with alcohol swab and utilizing anterolateral approach, patient's left knee space was injected with 4:1  marcaine 0.5%: Kenalog 40mg /dL. Patient tolerated the procedure well without immediate complications.    Impression and Recommendations:     This case required medical decision making of moderate complexity. The above documentation has been reviewed and is accurate and complete Hulan Saas, DO.       Note: This dictation was prepared with Dragon dictation along with smaller phrase technology. Any transcriptional errors that result from this process are unintentional.

## 2018-12-08 ENCOUNTER — Encounter: Payer: Self-pay | Admitting: Family Medicine

## 2018-12-08 DIAGNOSIS — M170 Bilateral primary osteoarthritis of knee: Secondary | ICD-10-CM | POA: Insufficient documentation

## 2018-12-08 NOTE — Assessment & Plan Note (Signed)
Hamstring significantly improved.  Discussed with her to continue the conservative therapy including the compression.  Likely she will be nearly healed in the next 6 weeks.  Patient is in agreement with the plan.

## 2018-12-08 NOTE — Assessment & Plan Note (Signed)
Bilateral injections given.  Discussed icing regimen and home exercise.  Discussed topical anti-inflammatories.  Discussed with patient possible need for bracing.  Patient could be a candidate for Visco supplementation.  Follow-up again in 4 to 6 weeks

## 2019-01-04 ENCOUNTER — Encounter: Payer: Self-pay | Admitting: Family Medicine

## 2019-01-04 ENCOUNTER — Other Ambulatory Visit: Payer: Self-pay

## 2019-01-04 ENCOUNTER — Ambulatory Visit: Payer: Medicare Other | Admitting: Family Medicine

## 2019-01-04 VITALS — BP 128/88 | HR 63 | Ht 60.0 in | Wt 160.0 lb

## 2019-01-04 DIAGNOSIS — M999 Biomechanical lesion, unspecified: Secondary | ICD-10-CM

## 2019-01-04 DIAGNOSIS — M5441 Lumbago with sciatica, right side: Secondary | ICD-10-CM | POA: Diagnosis not present

## 2019-01-04 DIAGNOSIS — M170 Bilateral primary osteoarthritis of knee: Secondary | ICD-10-CM

## 2019-01-04 DIAGNOSIS — G8929 Other chronic pain: Secondary | ICD-10-CM

## 2019-01-04 NOTE — Assessment & Plan Note (Signed)
Significant improvement after the injection 6 weeks ago.  Patient has not had any pain since the injections.  Continue conservative therapy.

## 2019-01-04 NOTE — Assessment & Plan Note (Signed)
Decision today to treat with OMT was based on Physical Exam  After verbal consent patient was treated with HVLA, ME, FPR techniques in cervical, thoracic, rib lumbar and sacral areas  Patient tolerated the procedure well with improvement in symptoms  Patient given exercises, stretches and lifestyle modifications  See medications in patient instructions if given  Patient will follow up in 6 weeks 

## 2019-01-04 NOTE — Progress Notes (Signed)
[facility] [place],  [address] Phone: [phone] Subjective:   [name], am serving as a scribe for Dr. Hulan Saas.   CC: Bilateral knee pain  XTK:[id number]   12/07/2018: Bilateral injections given.  Discussed icing regimen and home exercise.  Discussed topical anti-inflammatories.  Discussed with patient possible need for bracing.  Patient could be a candidate for Visco supplementation.  Follow-up again in 4 to 6 weeks  Update 01/04/2019: Felicia Perry is a 73 y.o. female coming in with complaint of bilateral knee pain. Patient states that she has been back out on her bike without pain. Patient is happy with her progress.  Patient states that the back pain seems to be mild overall.  Mild neck pain.  Nothing severe.  Patient wants to make sure that her parathyroid is still functioning appropriately but is following up with her primary care provider at the end of the month    Past Medical History:  Diagnosis Date  . Adenomatous polyp 11/23/2005  . Anxiety   . Chronic diastolic CHF (congestive heart failure) ([place])   . Coronary artery disease    a. HC on 08/14/16 showed RCA CTO s/p PCI, 60% LCX disease managed medically with recs to consider staged PCI if continued symptoms.   Marland Kitchen DIVERTICULOSIS, COLON 04/22/2007       . GERD (gastroesophageal reflux disease)   . History of shingles   . Hypertension   . Hypertensive heart disease   . Internal hemorrhoids   . Osteoarthritis    "knees, hands, lower back" (08/14/2016)  . PUD (peptic ulcer disease)   . Sleep apnea    a. resolved with weight loss   Past Surgical History:  Procedure Laterality Date  . BREAST EXCISIONAL BIOPSY    . BREAST SURGERY Left 1980s   Fibrous Tumors removed   . CARDIAC CATHETERIZATION N/A 08/14/2016   Procedure: Left Heart Cath and Coronary Angiography;  Surgeon: Sherren Mocha, MD;  Location: [facility];  Service: Cardiovascular;   Laterality: N/A;  . CARDIAC CATHETERIZATION N/A 08/14/2016   Procedure: Coronary Stent Intervention;  Surgeon: Sherren Mocha, MD;  Location: [facility];  Service: Cardiovascular;  Laterality: N/A;  . CATARACT EXTRACTION W/ INTRAOCULAR LENS  IMPLANT, BILATERAL Bilateral 05/2009  . CORONARY ANGIOPLASTY    . detached retina left eye     may 2019  . TUBAL LIGATION  1970s   Social History   Socioeconomic History  . Marital status: Divorced    Spouse name: Not on file  . Number of children: Not on file  . Years of education: Not on file  . Highest education level: Not on file  Occupational History  . Occupation: Education administrator  Social Needs  . Financial resource strain: Not on file  . Food insecurity:    Worry: Not on file    Inability: Not on file  . Transportation needs:    Medical: Not on file    Non-medical: Not on file  Tobacco Use  . Smoking status: Former Smoker    Packs/day: 1.00    Years: 26.00    Pack years: 26.00    Types: Cigarettes    Last attempt to quit: 1989    Years since quitting: 31.2  . Smokeless tobacco: Never Used  Substance and Sexual Activity  . Alcohol use: No  . Drug use: No  . Sexual activity: Not Currently  Lifestyle  . Physical activity:  Days per week: Not on file    Minutes per session: Not on file  . Stress: Not on file  Relationships  . Social connections:    Talks on phone: Not on file    Gets together: Not on file    Attends religious service: Not on file    Active member of club or organization: Not on file    Attends meetings of clubs or organizations: Not on file    Relationship status: Not on file  Other Topics Concern  . Not on file  Social History Narrative   Divorced for 51 years in 2016. 2 sons- 1 son had been in prison now living with her in 2019.  2 granddaughters with 1 living close.        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       Hobbies: watch tv-survivor, dancing  with the stars, enjoy alone time. Best friend [name] patient of Dr. Yong Channel. Enjoys work, time on Teaching laboratory technician.    Allergies  Allergen Reactions  . Celexa [Citalopram Hydrobromide]     psychosis  . Cephalosporins     REACTION: tongue swelling  . Clarithromycin     REACTION: blisters in mouth  . Doxycycline Hyclate     REACTION: nausea, vomiting  . Levofloxacin     REACTION: tongue swells  . Penicillins     REACTION: per patient causes rash,hives  . Rosuvastatin Other (See Comments)    Pt reports causes bilateral lower extremity muscle aches.   . Zolpidem Tartrate     REACTION: difficulty with concentration   Family History  Problem Relation Age of Onset  . Heart disease Mother   . Hypertension Mother   . Diabetes Mother   . COPD Father   . Colon cancer Neg Hx      Current Outpatient Medications (Cardiovascular):  .  amLODipine (NORVASC) 10 MG tablet, TAKE ONE TABLET DAILY .  ezetimibe (ZETIA) 10 MG tablet, Take 1 tablet (10 mg total) by mouth daily. .  irbesartan (AVAPRO) 300 MG tablet, Take 1 tablet (300 mg total) by mouth daily. .  metoprolol succinate (TOPROL-XL) 50 MG 24 hr tablet, Take 1 tablet (50 mg total) by mouth daily. Take with or immediately following a meal. .  omega-3 acid ethyl esters (LOVAZA) 1 g capsule, Take 2 capsules (2 g total) by mouth daily. .  nitroGLYCERIN (NITROSTAT) 0.4 MG SL tablet, Place 1 tablet (0.4 mg total) under the tongue every 5 (five) minutes as needed for chest pain.  Current Outpatient Medications (Respiratory):  .  albuterol (PROVENTIL HFA;VENTOLIN HFA) 108 (90 Base) MCG/ACT inhaler, Inhale 2 puffs into the lungs every 6 (six) hours as needed for wheezing or shortness of breath. .  budesonide-formoterol (SYMBICORT) 160-4.5 MCG/ACT inhaler, Inhale 2 puffs into the lungs 2 (two) times daily. Marland Kitchen  levocetirizine (XYZAL) 5 MG tablet, Take 5 mg by mouth every morning.   Current Outpatient Medications (Analgesics):  .  acetaminophen  (TYLENOL) 325 MG tablet, Take 650 mg by mouth every 6 (six) hours as needed. Marland Kitchen  aspirin EC 81 MG tablet, Take 1 tablet (81 mg total) by mouth daily.   Current Outpatient Medications (Other):  Marland Kitchen  ALPRAZolam (XANAX) 1 MG tablet, TAKE ONE TABLET AT BEDTIME AS NEEDED FOR SLEEP .  cyclobenzaprine (FLEXERIL) 10 MG tablet, Take 1 tablet (10 mg total) by mouth 2 (two) times daily as needed for muscle spasms. Marland Kitchen  gabapentin (NEURONTIN) 100  MG capsule, Take 2 capsules (200 mg total) by mouth at bedtime. .  Magnesium 200 MG TABS, Take 1 tablet (200 mg total) by mouth daily. .  pantoprazole (PROTONIX) 40 MG tablet, TAKE ONE TABLET DAILY .  venlafaxine XR (EFFEXOR XR) 75 MG 24 hr capsule, Take 1 capsule (75 mg total) by mouth daily with breakfast. .  Vitamin D, Ergocalciferol, (DRISDOL) 50000 units CAPS capsule, Take 2,000 Units by mouth daily.     Past medical history, social, surgical and family history all reviewed in electronic medical record.  No pertanent information unless stated regarding to the chief complaint.   Review of Systems:  No headache, visual changes, nausea, vomiting, diarrhea, constipation, dizziness, abdominal pain, skin rash, fevers, chills, night sweats, weight loss, swollen lymph nodes, body aches, joint swelling,chest pain, shortness of breath, mood changes.  Positive muscle aches  Objective  Blood pressure 128/88, pulse 63, height 5' (1.524 m), weight 160 lb (72.6 kg), SpO2 98 %. f    General: No apparent distress alert and oriented x3 mood and affect normal, dressed appropriately.  HEENT: Pupils equal, extraocular movements intact  Respiratory: Patient's speak in full sentences and does not appear short of breath  Cardiovascular: No lower extremity edema, non tender, no erythema  Skin: Warm dry intact with no signs of infection or rash on extremities or on axial skeleton.  Abdomen: Soft nontender  Neuro: Cranial nerves II through XII are intact, neurovascularly intact in  all extremities with 2+ DTRs and 2+ pulses.  Lymph: No lymphadenopathy of posterior or anterior cervical chain or axillae bilaterally.  Gait normal with good balance and coordination.  MSK:  Non tender with full range of motion and good stability and symmetric strength and tone of shoulders, elbows, wrist, hip, and ankles bilaterally.  Knee exam bilaterally still shows arthritic changes but no significant instability.  Minimal tenderness on exam today. Osteopathic findings  C6 flexed rotated and side bent left T3 extended rotated and side bent right inhaled third rib T9 extended rotated and side bent left L2 flexed rotated and side bent right Sacrum right on right     Impression and Recommendations:     This case required medical decision making of moderate complexity. The above documentation has been reviewed and is accurate and complete [name], DO       Note: This dictation was prepared with Dragon dictation along with smaller phrase technology. Any transcriptional errors that result from this process are unintentional.

## 2019-01-04 NOTE — Patient Instructions (Signed)
Good to see you  Ice is your friend Keep it up  See me again in 6-8 weeks

## 2019-01-04 NOTE — Assessment & Plan Note (Signed)
Low back pain, responds well to manipulation.  Discussed posture and ergonomics.  Discussed proper lifting mechanics.  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  Follow-up again in 6 weeks

## 2019-01-10 ENCOUNTER — Other Ambulatory Visit: Payer: Self-pay | Admitting: Family Medicine

## 2019-01-12 ENCOUNTER — Ambulatory Visit: Payer: Medicare Other | Admitting: Family Medicine

## 2019-01-12 ENCOUNTER — Encounter: Payer: Self-pay | Admitting: Family Medicine

## 2019-01-12 ENCOUNTER — Other Ambulatory Visit: Payer: Self-pay

## 2019-01-12 VITALS — BP 138/78 | HR 77 | Temp 98.1°F | Ht 60.0 in | Wt 162.4 lb

## 2019-01-12 DIAGNOSIS — I10 Essential (primary) hypertension: Secondary | ICD-10-CM

## 2019-01-12 DIAGNOSIS — I5032 Chronic diastolic (congestive) heart failure: Secondary | ICD-10-CM | POA: Insufficient documentation

## 2019-01-12 DIAGNOSIS — E785 Hyperlipidemia, unspecified: Secondary | ICD-10-CM

## 2019-01-12 DIAGNOSIS — Z79899 Other long term (current) drug therapy: Secondary | ICD-10-CM

## 2019-01-12 DIAGNOSIS — R739 Hyperglycemia, unspecified: Secondary | ICD-10-CM

## 2019-01-12 DIAGNOSIS — I25110 Atherosclerotic heart disease of native coronary artery with unstable angina pectoris: Secondary | ICD-10-CM

## 2019-01-12 LAB — COMPREHENSIVE METABOLIC PANEL
ALT: 19 U/L (ref 0–35)
AST: 18 U/L (ref 0–37)
Albumin: 4.5 g/dL (ref 3.5–5.2)
Alkaline Phosphatase: 84 U/L (ref 39–117)
BUN: 31 mg/dL — AB (ref 6–23)
CHLORIDE: 100 meq/L (ref 96–112)
CO2: 32 mEq/L (ref 19–32)
Calcium: 9.9 mg/dL (ref 8.4–10.5)
Creatinine, Ser: 0.88 mg/dL (ref 0.40–1.20)
GFR: 63.07 mL/min (ref >60.00)
GLUCOSE: 105 mg/dL — AB (ref 70–99)
POTASSIUM: 4.2 meq/L (ref 3.5–5.1)
SODIUM: 139 meq/L (ref 135–145)
Total Bilirubin: 0.4 mg/dL (ref 0.2–1.2)
Total Protein: 7.3 g/dL (ref 6.0–8.3)

## 2019-01-12 LAB — CBC
HEMATOCRIT: 43.4 % (ref 36.0–46.0)
Hemoglobin: 14.6 g/dL (ref 12.0–15.0)
MCHC: 33.6 g/dL (ref 30.0–36.0)
MCV: 88.7 fl (ref 78.0–100.0)
Platelets: 345 10*3/uL (ref 150.0–400.0)
RBC: 4.9 Mil/uL (ref 3.87–5.11)
RDW: 13.4 % (ref 11.5–15.5)
WBC: 11.2 10*3/uL — ABNORMAL HIGH (ref 4.0–10.5)

## 2019-01-12 LAB — VITAMIN B12: Vitamin B-12: 190 pg/mL — ABNORMAL LOW (ref 211–911)

## 2019-01-12 LAB — HEMOGLOBIN A1C: HEMOGLOBIN A1C: 6 % (ref 4.6–6.5)

## 2019-01-12 MED ORDER — ALPRAZOLAM 1 MG PO TABS: ORAL_TABLET | ORAL | 1 refills | Status: DC

## 2019-01-12 MED ORDER — ALBUTEROL SULFATE HFA 108 (90 BASE) MCG/ACT IN AERS: 2.0000 | INHALATION_SPRAY | Freq: Four times a day (QID) | RESPIRATORY_TRACT | 5 refills | Status: DC | PRN

## 2019-01-12 MED ORDER — AMLODIPINE BESYLATE 10 MG PO TABS: 10.0000 mg | ORAL_TABLET | Freq: Every day | ORAL | 3 refills | Status: DC

## 2019-01-12 MED ORDER — PANTOPRAZOLE SODIUM 20 MG PO TBEC: 20.0000 mg | DELAYED_RELEASE_TABLET | Freq: Every day | ORAL | 3 refills | Status: DC

## 2019-01-12 NOTE — Patient Instructions (Addendum)
Check with pharmacy to see if you are taking protonix/pantoprazole 40mg - if not  Simply remain off. If you are on 40mg - lets try 20 mg.   Schedule follow up with Dr. Meda Coffee if they are doing well checks right now  08/16/2019 or later for physical  Please stop by lab before you go If you do not have mychart- we will call you about results within 5 business days of [place] receiving them.  If you have mychart- we will send your results within 3 business days of [place] receiving them.  If abnormal or we want to clarify a result, we will call or mychart you to make sure you receive the message.  If you have questions or concerns or don't hear within 5-7 days, please send [place] a message or call us.

## 2019-01-12 NOTE — Progress Notes (Signed)
Phone [phone]   Subjective:  Felicia Perry is a 73 y.o. year old very pleasant female patient who presents for/with See problem oriented charting ROS- No chest pain or shortness of breath. Some weight gain from poor eating choices . Seasonal allergies with occasional headache. No edema   Past Medical History-  Patient Active Problem List   Diagnosis Date Noted  . Coronary artery disease     Priority: High  . Syncope 08/04/2016    Priority: High  . Hyperlipidemia, unspecified 07/11/2018    Priority: Medium  . H/O hyperparathyroidism 08/18/2016    Priority: Medium  . Vitamin D deficiency 08/20/2015    Priority: Medium  . Chronic pain syndrome 03/26/2015    Priority: Medium  . Insomnia 07/13/2014    Priority: Medium  . GERD (gastroesophageal reflux disease) 02/25/2011    Priority: Medium  . Depression 04/22/2007    Priority: Medium  . Essential hypertension 04/22/2007    Priority: Medium  . Asthma 04/22/2007    Priority: Medium  . Nonallopathic lesion of sacral region 07/05/2018    Priority: Low  . Nonallopathic lesion of cervical region 07/05/2018    Priority: Low  . Degenerative arthritis of left knee 09/15/2017    Priority: Low  . Hyperglycemia 08/06/2015    Priority: Low  . Xerosis of skin 08/06/2015    Priority: Low  . Hypersomnia with sleep apnea 03/26/2015    Priority: Low  . Nonallopathic lesion of lumbosacral region 11/13/2014    Priority: Low  . Osteoarthritis of left lower extremity 10/17/2014    Priority: Low  . Chronic meniscal tear of knee 10/17/2014    Priority: Low  . Nonallopathic lesion of thoracic region 09/14/2014    Priority: Low  . Nonallopathic lesion-rib cage 08/21/2014    Priority: Low  . Tendinopathy of right rotator cuff 07/30/2014    Priority: Low  . Piriformis syndrome of right side 07/30/2014    Priority: Low  . Limited joint range of motion 07/13/2014    Priority: Low  . Rash 07/13/2014    Priority: Low  . SI  (sacroiliac) joint dysfunction 10/10/2007    Priority: Low  . LOW BACK PAIN 07/01/2007    Priority: Low  . Chronic diastolic CHF (congestive heart failure) ([place]) 01/12/2019  . Degenerative arthritis of knee, bilateral 12/08/2018  . Partial hamstring tear, initial encounter 11/16/2018  . DDD (degenerative disc disease), cervical 05/04/2018  . DOE (dyspnea on exertion) 09/02/2016  . PUD (peptic ulcer disease)   . Enlarged lymph nodes 05/19/2016    Medications- reviewed and updated Current Outpatient Medications  Medication Sig Dispense Refill  . acetaminophen (TYLENOL) 325 MG tablet Take 650 mg by mouth every 6 (six) hours as needed.    Marland Kitchen albuterol (PROVENTIL HFA;VENTOLIN HFA) 108 (90 Base) MCG/ACT inhaler Inhale 2 puffs into the lungs every 6 (six) hours as needed for wheezing or shortness of breath. 1 Inhaler 5  . ALPRAZolam (XANAX) 1 MG tablet TAKE ONE TABLET AT BEDTIME AS NEEDED FOR SLEEP 90 tablet 1  . amLODipine (NORVASC) 10 MG tablet Take 1 tablet (10 mg total) by mouth daily. 90 tablet 3  . aspirin EC 81 MG tablet Take 1 tablet (81 mg total) by mouth daily. 90 tablet 3  . budesonide-formoterol (SYMBICORT) 160-4.5 MCG/ACT inhaler Inhale 2 puffs into the lungs 2 (two) times daily. 1 Inhaler 11  . cyclobenzaprine (FLEXERIL) 10 MG tablet Take 1 tablet (10 mg total) by mouth 2 (two) times daily as needed  for muscle spasms. 20 tablet 0  . ezetimibe (ZETIA) 10 MG tablet Take 1 tablet (10 mg total) by mouth daily. 90 tablet 2  . gabapentin (NEURONTIN) 100 MG capsule Take 2 capsules (200 mg total) by mouth at bedtime. 60 capsule 3  . irbesartan (AVAPRO) 300 MG tablet Take 1 tablet (300 mg total) by mouth daily. 90 tablet 2  . levocetirizine (XYZAL) 5 MG tablet Take 5 mg by mouth every morning.     . Magnesium 200 MG TABS Take 1 tablet (200 mg total) by mouth daily. 30 each   . metoprolol succinate (TOPROL-XL) 50 MG 24 hr tablet Take 1 tablet (50 mg total) by mouth daily. Take with or  immediately following a meal. 90 tablet 2  . omega-3 acid ethyl esters (LOVAZA) 1 g capsule Take 2 capsules (2 g total) by mouth daily. 60 capsule 11  . pantoprazole (PROTONIX) 40 MG tablet TAKE ONE TABLET DAILY 90 tablet 1  . venlafaxine XR (EFFEXOR-XR) 75 MG 24 hr capsule TAKE ONE CAPSULE EACH DAY WITH BREAKFAST 30 capsule 1  . Vitamin D, Ergocalciferol, (DRISDOL) 50000 units CAPS capsule Take 2,000 Units by mouth daily.     . nitroGLYCERIN (NITROSTAT) 0.4 MG SL tablet Place 1 tablet (0.4 mg total) under the tongue every 5 (five) minutes as needed for chest pain. 25 tablet 6  . pantoprazole (PROTONIX) 20 MG tablet Take 1 tablet (20 mg total) by mouth daily. 90 tablet 3   No current facility-administered medications for this visit.      Objective:  BP 138/78   Pulse 77   Temp 98.1 F (36.7 C) (Oral)   Ht 5' (1.524 m)   Wt 162 lb 6.4 oz (73.7 kg)   SpO2 99%   BMI 31.72 kg/m  Gen: NAD, resting comfortably CV: RRR no murmurs rubs or gallops Lungs: CTAB no crackles, wheeze, rhonchi Abdomen: soft/nontender/nondistended Ext: no edema Skin: warm, dry Neuro: gait and speech normal    Assessment and Plan   #hypertension S: controlled on metoprolol 50 mg extended release, irbesartan 300 mg daily, amlodipine 10 mg.    In regards to consideration if blood pressure high again-hydralazine should be avoided due to cardiac condition.  We have considered restarting hydrochlorothiazide if calcium remains okay.  Could also consider Imdur. BP Readings from Last 3 Encounters:  01/12/19 138/78  01/04/19 128/88  12/07/18 110/80  A/P: Thankfully blood pressure is controlled today with current medications- encouraged her to continue current medicines- high normal so hoping trends back down. Home #s in high 120s or low 130s- so I think shes doing fine   #hyperlipidemia/CAD S:  controlled on Zetia 10 mg as well as Lovaza on last check with LDL at 68.  She is statin intolerant. Taking aspirin- no  recent chest pain but has history of chest pain after 3 setents.   Knee is doing better and she has been able to get back on the bike. She is using this as a means to control weight. Also trying to watch food choices and eat a healthy diet. Up 3 lbs but working to get this back off Lab Results  Component Value Date   CHOL 145 02/03/2018   HDL 38 (L) 02/03/2018   LDLCALC 71 02/03/2018   LDLDIRECT 68.0 07/11/2018   TRIG 182 (H) 02/03/2018   CHOLHDL 3.8 02/03/2018   A/P: Stable. Continue current medications.  Last direct ldl ok- do full lipid panel net visti - she plans to follow up  with Dr. Meda Coffee   #Asthma  s: Patient's breathing is been doing reasonably well on Symbicort in winter months- has been able to come off.  She is also taking albuterol-uses this- hasnt had to use that either  A/P: Stable. Continue current medications.   # CHF was listed in the past in 2018- patient has not needed lasix in years. Has some on hand. I will not remove this yet but she is going to ask Dr. Meda Coffee again next visit and if she approves removal- we can remove this  # Depression S: controlled on venlafaxine A/P: phq9 under 5. Stable. Continue current medications.   Future Appointments  Date Time Provider [place]  02/15/2019  4:00 PM [name], DO [facility]   Return in about 6 months (around 07/15/2019) for physical.  Lab/Order associations: Hyperlipidemia, unspecified hyperlipidemia type - Plan: CBC, Comprehensive metabolic panel  Hyperglycemia - Plan: Hemoglobin W2B  Chronic diastolic CHF (congestive heart failure) ([place]), Chronic  Essential hypertension  Coronary artery disease involving native coronary artery of native heart with unstable angina pectoris (H[facility])  High risk medication use - Plan: Vitamin B12  Meds ordered this encounter  Medications  . ALPRAZolam (XANAX) 1 MG tablet    Sig: TAKE ONE TABLET AT BEDTIME AS NEEDED FOR SLEEP    Dispense:  90 tablet     Refill:  1  . amLODipine (NORVASC) 10 MG tablet    Sig: Take 1 tablet (10 mg total) by mouth daily.    Dispense:  90 tablet    Refill:  3  . albuterol (PROVENTIL HFA;VENTOLIN HFA) 108 (90 Base) MCG/ACT inhaler    Sig: Inhale 2 puffs into the lungs every 6 (six) hours as needed for wheezing or shortness of breath.    Dispense:  1 Inhaler    Refill:  5  . pantoprazole (PROTONIX) 20 MG tablet    Sig: Take 1 tablet (20 mg total) by mouth daily.    Dispense:  90 tablet    Refill:  3   Return precautions advised.  Garret Reddish, MD

## 2019-01-16 ENCOUNTER — Ambulatory Visit: Payer: Medicare Other | Admitting: Family Medicine

## 2019-02-06 ENCOUNTER — Telehealth: Payer: Self-pay | Admitting: *Deleted

## 2019-02-06 NOTE — Telephone Encounter (Signed)
Pt rescheduled for 8/27, as requested by the pt and okayed by Dr Meda Coffee.

## 2019-02-06 NOTE — Telephone Encounter (Signed)
-----   Message from Felicia [place], [place] sent at 02/03/2019 10:33 AM EDT ----- Spoke with patient to discuss virtual visit that she has scheduled on 02/16/2019 and she stated that she has decided that if okay with Dr Meda Coffee she would like to push this appointment out until August when she can schedule a "live" visit to come into the office. She is not having any issues and she just saw her PCP and he gave her a "clean bill of health" and she reports that all labs were normal. She will call the office if any cardiac issues arise.

## 2019-02-07 ENCOUNTER — Telehealth: Payer: Self-pay

## 2019-02-07 NOTE — Telephone Encounter (Signed)
Called and rescheduled appointment on 4/22 to 5/6

## 2019-02-15 ENCOUNTER — Ambulatory Visit: Payer: Medicare Other | Admitting: Family Medicine

## 2019-02-16 ENCOUNTER — Telehealth: Payer: Medicare Other | Admitting: Cardiology

## 2019-03-01 ENCOUNTER — Other Ambulatory Visit: Payer: Self-pay

## 2019-03-01 ENCOUNTER — Ambulatory Visit: Payer: Medicare Other | Admitting: Family Medicine

## 2019-03-01 ENCOUNTER — Encounter: Payer: Self-pay | Admitting: Family Medicine

## 2019-03-01 VITALS — BP 150/84 | HR 75 | Ht 60.0 in | Wt 162.0 lb

## 2019-03-01 DIAGNOSIS — S76319A Strain of muscle, fascia and tendon of the posterior muscle group at thigh level, unspecified thigh, initial encounter: Secondary | ICD-10-CM | POA: Diagnosis not present

## 2019-03-01 DIAGNOSIS — M999 Biomechanical lesion, unspecified: Secondary | ICD-10-CM

## 2019-03-01 MED ORDER — VENLAFAXINE HCL ER 75 MG PO CP24: 75.0000 mg | ORAL_CAPSULE | Freq: Every day | ORAL | 0 refills | Status: DC

## 2019-03-01 NOTE — Assessment & Plan Note (Signed)
Patient read exacerbation of hamstring injury.  Discussed.  Discussed which activities.  Advised compression sleeve.askling exercises given return to clinic in 4 to 6 weeks

## 2019-03-01 NOTE — Patient Instructions (Signed)
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

## 2019-03-01 NOTE — Progress Notes (Signed)
[name] [address] [place], [place] [address] Phone: [phone] Subjective:   I Felicia Perry am serving as a Education administrator for Dr. Hulan Saas.   CC: Right-sided leg pain and back pain  BTD:[id number]  Felicia Perry is a 73 y.o. female coming in with complaint of right leg pain. Twisted her right leg on a ladder. Hamstring pain. States her balance is off. Would like 90 day supply of Effexor.  Patient previously did have a hamstring injury back in February.  Was feeling significantly better.  Still seem to be every exacerbation she states.  To make sure she is doing everything right so she can avoid this.  Patient denies only medicine significant treatment pain at this time.  Daily activities.       Past Medical History:  Diagnosis Date  . Adenomatous polyp 11/23/2005  . Anxiety   . Chronic diastolic CHF (congestive heart failure) ([place])   . Coronary artery disease    a. HC on 08/14/16 showed RCA CTO s/p PCI, 60% LCX disease managed medically with recs to consider staged PCI if continued symptoms.   Marland Kitchen DIVERTICULOSIS, COLON 04/22/2007       . GERD (gastroesophageal reflux disease)   . History of shingles   . Hypertension   . Hypertensive heart disease   . Internal hemorrhoids   . Osteoarthritis    "knees, hands, lower back" (08/14/2016)  . PUD (peptic ulcer disease)   . Sleep apnea    a. resolved with weight loss   Past Surgical History:  Procedure Laterality Date  . BREAST EXCISIONAL BIOPSY    . BREAST SURGERY Left 1980s   Fibrous Tumors removed   . CARDIAC CATHETERIZATION N/A 08/14/2016   Procedure: Left Heart Cath and Coronary Angiography;  Surgeon: Sherren Mocha, MD;  Location: [facility];  Service: Cardiovascular;  Laterality: N/A;  . CARDIAC CATHETERIZATION N/A 08/14/2016   Procedure: Coronary Stent Intervention;  Surgeon: Sherren Mocha, MD;  Location: [facility];  Service: Cardiovascular;  Laterality: N/A;  .  CATARACT EXTRACTION W/ INTRAOCULAR LENS  IMPLANT, BILATERAL Bilateral 05/2009  . CORONARY ANGIOPLASTY    . detached retina left eye     may 2019  . TUBAL LIGATION  1970s   Social History   Socioeconomic History  . Marital status: Divorced    Spouse name: Not on file  . Number of children: Not on file  . Years of education: Not on file  . Highest education level: Not on file  Occupational History  . Occupation: Education administrator  Social Needs  . Financial resource strain: Not on file  . Food insecurity:    Worry: Not on file    Inability: Not on file  . Transportation needs:    Medical: Not on file    Non-medical: Not on file  Tobacco Use  . Smoking status: Former Smoker    Packs/day: 1.00    Years: 26.00    Pack years: 26.00    Types: Cigarettes    Last attempt to quit: 1989    Years since quitting: 31.3  . Smokeless tobacco: Never Used  Substance and Sexual Activity  . Alcohol use: No  . Drug use: No  . Sexual activity: Not Currently  Lifestyle  . Physical activity:    Days per week: Not on file    Minutes per session: Not on file  . Stress: Not on file  Relationships  . Social connections:  Talks on phone: Not on file    Gets together: Not on file    Attends religious service: Not on file    Active member of club or organization: Not on file    Attends meetings of clubs or organizations: Not on file    Relationship status: Not on file  Other Topics Concern  . Not on file  Social History Narrative   Divorced for 95 years in 2016. 2 sons- 1 son had been in prison now living with her in 2019.  2 granddaughters with 1 living close.        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       Hobbies: watch tv-survivor, dancing with the stars, enjoy alone time. Best friend [name] patient of Dr. Yong Channel. Enjoys work, time on Teaching laboratory technician.    Allergies  Allergen Reactions  . Celexa [Citalopram Hydrobromide]     psychosis  .  Cephalosporins     REACTION: tongue swelling  . Clarithromycin     REACTION: blisters in mouth  . Doxycycline Hyclate     REACTION: nausea, vomiting  . Levofloxacin     REACTION: tongue swells  . Penicillins     REACTION: per patient causes rash,hives  . Rosuvastatin Other (See Comments)    Pt reports causes bilateral lower extremity muscle aches.   . Zolpidem Tartrate     REACTION: difficulty with concentration   Family History  Problem Relation Age of Onset  . Heart disease Mother   . Hypertension Mother   . Diabetes Mother   . COPD Father   . Colon cancer Neg Hx      Current Outpatient Medications (Cardiovascular):  .  amLODipine (NORVASC) 10 MG tablet, Take 1 tablet (10 mg total) by mouth daily. Marland Kitchen  ezetimibe (ZETIA) 10 MG tablet, Take 1 tablet (10 mg total) by mouth daily. .  irbesartan (AVAPRO) 300 MG tablet, Take 1 tablet (300 mg total) by mouth daily. .  metoprolol succinate (TOPROL-XL) 50 MG 24 hr tablet, Take 1 tablet (50 mg total) by mouth daily. Take with or immediately following a meal. .  omega-3 acid ethyl esters (LOVAZA) 1 g capsule, Take 2 capsules (2 g total) by mouth daily. .  nitroGLYCERIN (NITROSTAT) 0.4 MG SL tablet, Place 1 tablet (0.4 mg total) under the tongue every 5 (five) minutes as needed for chest pain.  Current Outpatient Medications (Respiratory):  .  albuterol (PROVENTIL HFA;VENTOLIN HFA) 108 (90 Base) MCG/ACT inhaler, Inhale 2 puffs into the lungs every 6 (six) hours as needed for wheezing or shortness of breath. .  budesonide-formoterol (SYMBICORT) 160-4.5 MCG/ACT inhaler, Inhale 2 puffs into the lungs 2 (two) times daily. Marland Kitchen  levocetirizine (XYZAL) 5 MG tablet, Take 5 mg by mouth every morning.   Current Outpatient Medications (Analgesics):  .  acetaminophen (TYLENOL) 325 MG tablet, Take 650 mg by mouth every 6 (six) hours as needed. Marland Kitchen  aspirin EC 81 MG tablet, Take 1 tablet (81 mg total) by mouth daily.   Current Outpatient Medications  (Other):  Marland Kitchen  ALPRAZolam (XANAX) 1 MG tablet, TAKE ONE TABLET AT BEDTIME AS NEEDED FOR SLEEP .  cyclobenzaprine (FLEXERIL) 10 MG tablet, Take 1 tablet (10 mg total) by mouth 2 (two) times daily as needed for muscle spasms. Marland Kitchen  gabapentin (NEURONTIN) 100 MG capsule, Take 2 capsules (200 mg total) by mouth at bedtime. .  Magnesium 200 MG TABS, Take 1 tablet (200 mg total) by  mouth daily. .  pantoprazole (PROTONIX) 20 MG tablet, Take 1 tablet (20 mg total) by mouth daily. .  pantoprazole (PROTONIX) 40 MG tablet, TAKE ONE TABLET DAILY .  Vitamin D, Ergocalciferol, (DRISDOL) 50000 units CAPS capsule, Take 2,000 Units by mouth daily.  Marland Kitchen  venlafaxine XR (EFFEXOR-XR) 75 MG 24 hr capsule, Take 1 capsule (75 mg total) by mouth daily with breakfast.    Past medical history, social, surgical and family history all reviewed in electronic medical record.  No pertanent information unless stated regarding to the chief complaint.   Review of Systems:  No headache, visual changes, nausea, vomiting, diarrhea, constipation, dizziness, abdominal pain, skin rash, fevers, chills, night sweats, weight loss, swollen lymph nodes, body aches, joint swelling, chest pain, shortness of breath, mood changes.  Mild positive muscle aches  Objective  Blood pressure (!) 150/84, pulse 75, height 5' (1.524 m), weight 162 lb (73.5 kg), SpO2 97 %.    General: No apparent distress alert and oriented x3 mood and affect normal, dressed appropriately.  HEENT: Pupils equal, extraocular movements intact  Respiratory: Patient's speak in full sentences and does not appear short of breath  Cardiovascular: No lower extremity edema, non tender, no erythema  Skin: Warm dry intact with no signs of infection or rash on extremities or on axial skeleton.  Abdomen: Soft nontender  Neuro: Cranial nerves II through XII are intact, neurovascularly intact in all extremities with 2+ DTRs and 2+ pulses.  Lymph: No lymphadenopathy of posterior or  anterior cervical chain or axillae bilaterally.  Gait normal with good balance and coordination.  MSK:  Non tender with full range of motion and good stability and symmetric strength and tone of shoulders, elbows, wrist, hip, knee and ankles bilaterally.  Mild arthritic changes of multiple joints  Right hamstring does show some tightness noted.  Patient is tender to palpation at the insertion laterally.  Patient does have tightness with Corky Sox test.  Negative straight leg test.  Back exam does have some loss of lordosis.  Tender to palpation in the paraspinal musculature lumbar spine right greater than left.  Pain seems to be more over the right sacroiliac joint.   Osteopathic findings C2 flexed rotated and side bent right C6 flexed rotated and side bent left T3 extended rotated and side bent right inhaled third rib T5 extended rotated and side bent left L2 flexed rotate knee  Sacrum right on right      Impression and Recommendations:     This case required medical decision making of moderate complexity. The above documentation has been reviewed and is accurate and complete [name], DO       Note: This dictation was prepared with Dragon dictation along with smaller phrase technology. Any transcriptional errors that result from this process are unintentional.

## 2019-03-01 NOTE — Assessment & Plan Note (Signed)
Decision today to treat with OMT was based on Physical Exam  After verbal consent patient was treated with HVLA, ME, FPR techniques in cervical, thoracic, rib, lumbar and sacral areas  Patient tolerated the procedure well with improvement in symptoms  Patient given exercises, stretches and lifestyle modifications  See medications in patient instructions if given  Patient will follow up in 4-6 weeks 

## 2019-03-08 ENCOUNTER — Telehealth: Payer: Self-pay | Admitting: Pharmacist

## 2019-03-08 MED ORDER — BEMPEDOIC ACID 180 MG PO TABS: 1.0000 | ORAL_TABLET | Freq: Every day | ORAL | 11 refills | Status: DC

## 2019-03-08 NOTE — Telephone Encounter (Signed)
Nexletol PA approved through 03/07/20, rx sent to pharmacy to determine if copay is affordable. Called pharmacy, they are still not able to process rx (says PA still needed). Will call pharmacy tomorrow to see if it can be processed.

## 2019-03-08 NOTE — Telephone Encounter (Signed)
Called pt to discuss trying Nexletol for her cholesterol, she is agreeable. Will submit PA and follow up with pt once copay information is available.

## 2019-03-09 NOTE — Telephone Encounter (Signed)
Nexletol processed for $137 - suspect $100 deductible with follow up copay of $37/month. Pt will reach out to her insurance to see if this is the case - she would be ok with 1 time copay this high but does not want to pay this much each month for Nexletol. She will call back with an update.

## 2019-04-11 NOTE — Assessment & Plan Note (Signed)
Low back pain.  More thoracolumbar juncture.  Discussed icing regimen and home exercise.  Discussed which activities to do which wants to avoid.  Work on a regular basis.  Follow-up again in 6 weeks

## 2019-04-11 NOTE — Progress Notes (Signed)
[facility] [address] [place], [place] [address] Phone: [phone] Subjective:   I Felicia Perry am serving as a Education administrator for Dr. Hulan Saas.   I'm seeing this patient by the request  of:    CC: Low back pain and leg pain  [id number]     Update 04/12/2019: Felicia Perry is a 73 y.o. female coming in with complaint of hamstring and back pain. Patient states that she does not get to exercise as much as she would like. Tries to do exercise at home which is not the same. States she is gaining weight which is putting pressure in the knees. Hamstring is still an issue. Problems with getting out of the tub, squatting etc. patient does not feel that then knees are bad enough to have another potential injection at this time.  Patient does have tightness overall.  Nighttime cramping of the legs as well she has noticed recently Past Medical History:  Diagnosis Date  . Adenomatous polyp 11/23/2005  . Anxiety   . Chronic diastolic CHF (congestive heart failure) ([facility])   . Coronary artery disease    a. HC on 08/14/16 showed RCA CTO s/p PCI, 60% LCX disease managed medically with recs to consider staged PCI if continued symptoms.   Marland Kitchen DIVERTICULOSIS, COLON 04/22/2007       . GERD (gastroesophageal reflux disease)   . History of shingles   . Hypertension   . Hypertensive heart disease   . Internal hemorrhoids   . Osteoarthritis    "knees, hands, lower back" (08/14/2016)  . PUD (peptic ulcer disease)   . Sleep apnea    a. resolved with weight loss   Past Surgical History:  Procedure Laterality Date  . BREAST EXCISIONAL BIOPSY    . BREAST SURGERY Left 1980s   Fibrous Tumors removed   . CARDIAC CATHETERIZATION N/A 08/14/2016   Procedure: Left Heart Cath and Coronary Angiography;  Surgeon: Sherren Mocha, MD;  Location: [facility];  Service: Cardiovascular;  Laterality: N/A;  . CARDIAC CATHETERIZATION N/A 08/14/2016   Procedure: Coronary Stent  Intervention;  Surgeon: Sherren Mocha, MD;  Location: [facility];  Service: Cardiovascular;  Laterality: N/A;  . CATARACT EXTRACTION W/ INTRAOCULAR LENS  IMPLANT, BILATERAL Bilateral 05/2009  . CORONARY ANGIOPLASTY    . detached retina left eye     may 2019  . TUBAL LIGATION  1970s   Social History   Socioeconomic History  . Marital status: Divorced    Spouse name: Not on file  . Number of children: Not on file  . Years of education: Not on file  . Highest education level: Not on file  Occupational History  . Occupation: Education administrator  Social Needs  . Financial resource strain: Not on file  . Food insecurity    Worry: Not on file    Inability: Not on file  . Transportation needs    Medical: Not on file    Non-medical: Not on file  Tobacco Use  . Smoking status: Former Smoker    Packs/day: 1.00    Years: 26.00    Pack years: 26.00    Types: Cigarettes    Quit date: 1989    Years since quitting: 31.4  . Smokeless tobacco: Never Used  Substance and Sexual Activity  . Alcohol use: No  . Drug use: No  . Sexual activity: Not Currently  Lifestyle  . Physical activity    Days per week: Not on file  Minutes per session: Not on file  . Stress: Not on file  Relationships  . Social Herbalist on phone: Not on file    Gets together: Not on file    Attends religious service: Not on file    Active member of club or organization: Not on file    Attends meetings of clubs or organizations: Not on file    Relationship status: Not on file  Other Topics Concern  . Not on file  Social History Narrative   Divorced for 45 years in 2016. 2 sons- 1 son had been in prison now living with her in 2019.  2 granddaughters with 1 living close.        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       Hobbies: watch tv-survivor, dancing with the stars, enjoy alone time. Best friend [name] patient of Dr. Yong Channel. Enjoys work, time on  Teaching laboratory technician.    Allergies  Allergen Reactions  . Celexa [Citalopram Hydrobromide]     psychosis  . Cephalosporins     REACTION: tongue swelling  . Clarithromycin     REACTION: blisters in mouth  . Doxycycline Hyclate     REACTION: nausea, vomiting  . Levofloxacin     REACTION: tongue swells  . Penicillins     REACTION: per patient causes rash,hives  . Rosuvastatin Other (See Comments)    Pt reports causes bilateral lower extremity muscle aches.   . Zolpidem Tartrate     REACTION: difficulty with concentration   Family History  Problem Relation Age of Onset  . Heart disease Mother   . Hypertension Mother   . Diabetes Mother   . COPD Father   . Colon cancer Neg Hx      Current Outpatient Medications (Cardiovascular):  .  amLODipine (NORVASC) 10 MG tablet, Take 1 tablet (10 mg total) by mouth daily. Marland Kitchen  ezetimibe (ZETIA) 10 MG tablet, Take 1 tablet (10 mg total) by mouth daily. .  irbesartan (AVAPRO) 300 MG tablet, Take 1 tablet (300 mg total) by mouth daily. .  metoprolol succinate (TOPROL-XL) 50 MG 24 hr tablet, Take 1 tablet (50 mg total) by mouth daily. Take with or immediately following a meal. .  omega-3 acid ethyl esters (LOVAZA) 1 g capsule, Take 2 capsules (2 g total) by mouth daily. .  nitroGLYCERIN (NITROSTAT) 0.4 MG SL tablet, Place 1 tablet (0.4 mg total) under the tongue every 5 (five) minutes as needed for chest pain.  Current Outpatient Medications (Respiratory):  .  albuterol (PROVENTIL HFA;VENTOLIN HFA) 108 (90 Base) MCG/ACT inhaler, Inhale 2 puffs into the lungs every 6 (six) hours as needed for wheezing or shortness of breath. .  budesonide-formoterol (SYMBICORT) 160-4.5 MCG/ACT inhaler, Inhale 2 puffs into the lungs 2 (two) times daily. Marland Kitchen  levocetirizine (XYZAL) 5 MG tablet, Take 5 mg by mouth every morning.   Current Outpatient Medications (Analgesics):  .  acetaminophen (TYLENOL) 325 MG tablet, Take 650 mg by mouth every 6 (six) hours as needed. Marland Kitchen   aspirin EC 81 MG tablet, Take 1 tablet (81 mg total) by mouth daily.   Current Outpatient Medications (Other):  Marland Kitchen  ALPRAZolam (XANAX) 1 MG tablet, TAKE ONE TABLET AT BEDTIME AS NEEDED FOR SLEEP .  cyclobenzaprine (FLEXERIL) 10 MG tablet, Take 1 tablet (10 mg total) by mouth 2 (two) times daily as needed for muscle spasms. Marland Kitchen  gabapentin (NEURONTIN) 100 MG capsule, Take 2  capsules (200 mg total) by mouth at bedtime. .  Magnesium 200 MG TABS, Take 1 tablet (200 mg total) by mouth daily. .  pantoprazole (PROTONIX) 20 MG tablet, Take 1 tablet (20 mg total) by mouth daily. .  pantoprazole (PROTONIX) 40 MG tablet, TAKE ONE TABLET DAILY .  venlafaxine XR (EFFEXOR-XR) 75 MG 24 hr capsule, Take 1 capsule (75 mg total) by mouth daily with breakfast. .  Vitamin D, Ergocalciferol, (DRISDOL) 50000 units CAPS capsule, Take 2,000 Units by mouth daily.     Past medical history, social, surgical and family history all reviewed in electronic medical record.  No pertanent information unless stated regarding to the chief complaint.   Review of Systems:  No headache, visual changes, nausea, vomiting, diarrhea, constipation, dizziness, abdominal pain, skin rash, fevers, chills, night sweats, weight loss, swollen lymph nodes, body aches, joint swelling, chest pain, shortness of breath, mood changes.  Positive muscle aches  Objective  Blood pressure (!) 142/76, pulse 81, height 5' (1.524 m), weight 162 lb (73.5 kg), SpO2 98 %.    General: No apparent distress alert and oriented x3 mood and affect normal, dressed appropriately.  HEENT: Pupils equal, extraocular movements intact  Respiratory: Patient's speak in full sentences and does not appear short of breath  Cardiovascular: Trace Lower extremity edema, non tender, no erythema  Skin: Warm dry intact with no signs of infection or rash on extremities or on axial skeleton.  Abdomen: Soft nontender  Neuro: Cranial nerves II through XII are intact, neurovascularly  intact in all extremities with 2+ DTRs and 2+ pulses.  Lymph: No lymphadenopathy of posterior or anterior cervical chain or axillae bilaterally.  Gait mild antalgic MSK:  tender with full range of motion and good stability and symmetric strength and tone of shoulders, elbows, wrist, hip, knee and ankles bilaterally.  Arthritic changes of multiple joints Back Exam:  Inspection: Mild loss of lordosis Motion: Flexion 35 deg, Extension 25 deg, Side Bending to 35 deg bilaterally,  Rotation to 45 deg bilaterally  SLR laying: Negative  XSLR laying: Negative  Palpable tenderness: Palpation of paraspinal musculature.Marland Kitchen FABER: Tightness bilaterally. Sensory change: Gross sensation intact to all lumbar and sacral dermatomes.  Reflexes: 2+ at both patellar tendons, 2+ at achilles tendons, Babinski's downgoing.  Strength at foot  Plantar-flexion: 5/5 Dorsi-flexion: 5/5 Eversion: 5/5 Inversion: 5/5  Leg strength  Quad: 5/5 Hamstring: 5/5 Hip flexor: 5/5 Hip abductors: 5/5  Gait unremarkable.  Osteopathic findings  C6 flexed rotated and side bent left T3 extended rotated and side bent right inhaled third rib T9 extended rotated and side bent left L3 flexed rotated and side bent right Sacrum left on left    Impression and Recommendations:     This case required medical decision making of moderate complexity. The above documentation has been reviewed and is accurate and complete [name], DO       Note: This dictation was prepared with Dragon dictation along with smaller phrase technology. Any transcriptional errors that result from this process are unintentional.

## 2019-04-11 NOTE — Assessment & Plan Note (Signed)
Decision today to treat with OMT was based on Physical Exam  After verbal consent patient was treated with HVLA, ME, FPR techniques in cervical, thoracic, rib lumbar and sacral areas  Patient tolerated the procedure well with improvement in symptoms  Patient given exercises, stretches and lifestyle modifications  See medications in patient instructions if given  Patient will follow up in 6 weeks 

## 2019-04-12 ENCOUNTER — Ambulatory Visit: Payer: Medicare Other | Admitting: Family Medicine

## 2019-04-12 ENCOUNTER — Other Ambulatory Visit: Payer: Self-pay

## 2019-04-12 ENCOUNTER — Encounter: Payer: Self-pay | Admitting: Family Medicine

## 2019-04-12 VITALS — BP 142/76 | HR 81 | Ht 60.0 in | Wt 162.0 lb

## 2019-04-12 DIAGNOSIS — M5441 Lumbago with sciatica, right side: Secondary | ICD-10-CM

## 2019-04-12 DIAGNOSIS — M999 Biomechanical lesion, unspecified: Secondary | ICD-10-CM

## 2019-04-12 DIAGNOSIS — G8929 Other chronic pain: Secondary | ICD-10-CM | POA: Diagnosis not present

## 2019-04-12 NOTE — Patient Instructions (Signed)
Good to see you.  Iron 65 MG daily with lemonade Eat 30 before working out See me again in 5 weeks

## 2019-05-11 MED ORDER — NEXLETOL 180 MG PO TABS: 1.0000 | ORAL_TABLET | Freq: Every day | ORAL | 11 refills | Status: DC

## 2019-05-11 NOTE — Telephone Encounter (Signed)
Called back regarding Nexletol as we have had success with [employer]. Pt was approved for $2500 in copay assistance through 04/09/20. Rx sent to pharmacy and pt approved for $0 copay. She will have her PCP draw labs in 3 months.  ID [id number] PCN [id number] GRP [id number] BIN [id number]

## 2019-05-11 NOTE — Addendum Note (Signed)
Addended by: SUPPLE, MEGAN E on: 05/11/2019 03:28 PM   Modules accepted: Orders

## 2019-05-17 ENCOUNTER — Other Ambulatory Visit: Payer: Self-pay

## 2019-05-17 ENCOUNTER — Ambulatory Visit: Payer: Medicare Other | Admitting: Family Medicine

## 2019-05-17 ENCOUNTER — Encounter: Payer: Self-pay | Admitting: Family Medicine

## 2019-05-17 ENCOUNTER — Other Ambulatory Visit (INDEPENDENT_AMBULATORY_CARE_PROVIDER_SITE_OTHER): Payer: Medicare Other

## 2019-05-17 VITALS — BP 132/72 | HR 83 | Ht 60.0 in | Wt 162.0 lb

## 2019-05-17 DIAGNOSIS — M2550 Pain in unspecified joint: Secondary | ICD-10-CM | POA: Diagnosis not present

## 2019-05-17 DIAGNOSIS — M999 Biomechanical lesion, unspecified: Secondary | ICD-10-CM | POA: Diagnosis not present

## 2019-05-17 DIAGNOSIS — M5441 Lumbago with sciatica, right side: Secondary | ICD-10-CM

## 2019-05-17 DIAGNOSIS — G8929 Other chronic pain: Secondary | ICD-10-CM

## 2019-05-17 LAB — LIPID PANEL
Cholesterol: 143 mg/dL (ref 0–200)
HDL: 33 mg/dL — ABNORMAL LOW (ref >39.00)
NonHDL: 109.68
Total CHOL/HDL Ratio: 4
Triglycerides: 345 mg/dL — ABNORMAL HIGH (ref 0.0–149.0)
VLDL: 69 mg/dL — ABNORMAL HIGH (ref 0.0–40.0)

## 2019-05-17 LAB — LDL CHOLESTEROL, DIRECT: Direct LDL: 76 mg/dL

## 2019-05-17 NOTE — Assessment & Plan Note (Signed)
Decision today to treat with OMT was based on Physical Exam  After verbal consent patient was treated with HVLA, ME, FPR techniques in cervical, thoracic, rib lumbar and sacral areas  Patient tolerated the procedure well with improvement in symptoms  Patient given exercises, stretches and lifestyle modifications  See medications in patient instructions if given  Patient will follow up in 4-8 weeks 

## 2019-05-17 NOTE — Progress Notes (Signed)
[name] [address], [place] [address] Phone: [phone] Subjective:   Felicia Perry, am serving as a scribe for Dr. Hulan Saas.  I'm seeing this patient by the request  of:    CC: Neck and back pain follow-up  [id number]  Felicia Perry is a 73 y.o. female coming in with complaint of back pain. Last seen on 04/12/2019. Patient states that her pain is improving since last visit. Is not as fatigued as last visit. Is using iron 65mg  w 500mg  vit c. Less pain in hamstring in right leg. Is now stretching for 15 minutes a day. Has been approved for a new trial medication for hyperlipidemia. Has to get a cholesterol lab value for pharmacist that is taking patient through trial.     Past Medical History:  Diagnosis Date  . Adenomatous polyp 11/23/2005  . Anxiety   . Chronic diastolic CHF (congestive heart failure) ([facility])   . Coronary artery disease    a. HC on 08/14/16 showed RCA CTO s/p PCI, 60% LCX disease managed medically with recs to consider staged PCI if continued symptoms.   Marland Kitchen DIVERTICULOSIS, COLON 04/22/2007       . GERD (gastroesophageal reflux disease)   . History of shingles   . Hypertension   . Hypertensive heart disease   . Internal hemorrhoids   . Osteoarthritis    "knees, hands, lower back" (08/14/2016)  . PUD (peptic ulcer disease)   . Sleep apnea    a. resolved with weight loss   Past Surgical History:  Procedure Laterality Date  . BREAST EXCISIONAL BIOPSY    . BREAST SURGERY Left 1980s   Fibrous Tumors removed   . CARDIAC CATHETERIZATION N/A 08/14/2016   Procedure: Left Heart Cath and Coronary Angiography;  Surgeon: Sherren Mocha, MD;  Location: [facility];  Service: Cardiovascular;  Laterality: N/A;  . CARDIAC CATHETERIZATION N/A 08/14/2016   Procedure: Coronary Stent Intervention;  Surgeon: Sherren Mocha, MD;  Location: [facility];  Service: Cardiovascular;  Laterality: N/A;  . CATARACT  EXTRACTION W/ INTRAOCULAR LENS  IMPLANT, BILATERAL Bilateral 05/2009  . CORONARY ANGIOPLASTY    . detached retina left eye     may 2019  . TUBAL LIGATION  1970s   Social History   Socioeconomic History  . Marital status: Divorced    Spouse name: Not on file  . Number of children: Not on file  . Years of education: Not on file  . Highest education level: Not on file  Occupational History  . Occupation: Education administrator  Social Needs  . Financial resource strain: Not on file  . Food insecurity    Worry: Not on file    Inability: Not on file  . Transportation needs    Medical: Not on file    Non-medical: Not on file  Tobacco Use  . Smoking status: Former Smoker    Packs/day: 1.00    Years: 26.00    Pack years: 26.00    Types: Cigarettes    Quit date: 1989    Years since quitting: 31.5  . Smokeless tobacco: Never Used  Substance and Sexual Activity  . Alcohol use: Perry  . Drug use: Perry  . Sexual activity: Not Currently  Lifestyle  . Physical activity    Days per week: Not on file    Minutes per session: Not on file  . Stress: Not on file  Relationships  . Social connections  Talks on phone: Not on file    Gets together: Not on file    Attends religious service: Not on file    Active member of club or organization: Not on file    Attends meetings of clubs or organizations: Not on file    Relationship status: Not on file  Other Topics Concern  . Not on file  Social History Narrative   Divorced for 38 years in 2016. 2 sons- 1 son had been in prison now living with her in 2019.  2 granddaughters with 1 living close.        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       Hobbies: watch tv-survivor, dancing with the stars, enjoy alone time. Best friend [name] patient of Dr. Yong Channel. Enjoys work, time on Teaching laboratory technician.    Allergies  Allergen Reactions  . Celexa [Citalopram Hydrobromide]     psychosis  . Cephalosporins     REACTION:  tongue swelling  . Clarithromycin     REACTION: blisters in mouth  . Doxycycline Hyclate     REACTION: nausea, vomiting  . Levofloxacin     REACTION: tongue swells  . Penicillins     REACTION: per patient causes rash,hives  . Rosuvastatin Other (See Comments)    Pt reports causes bilateral lower extremity muscle aches.   . Zolpidem Tartrate     REACTION: difficulty with concentration   Family History  Problem Relation Age of Onset  . Heart disease Mother   . Hypertension Mother   . Diabetes Mother   . COPD Father   . Colon cancer Neg Hx      Current Outpatient Medications (Cardiovascular):  .  amLODipine (NORVASC) 10 MG tablet, Take 1 tablet (10 mg total) by mouth daily. .  Bempedoic Acid (NEXLETOL) 180 MG TABS, Take 1 tablet by mouth daily. Marland Kitchen  ezetimibe (ZETIA) 10 MG tablet, Take 1 tablet (10 mg total) by mouth daily. .  irbesartan (AVAPRO) 300 MG tablet, Take 1 tablet (300 mg total) by mouth daily. .  metoprolol succinate (TOPROL-XL) 50 MG 24 hr tablet, Take 1 tablet (50 mg total) by mouth daily. Take with or immediately following a meal. .  omega-3 acid ethyl esters (LOVAZA) 1 g capsule, Take 2 capsules (2 g total) by mouth daily. .  nitroGLYCERIN (NITROSTAT) 0.4 MG SL tablet, Place 1 tablet (0.4 mg total) under the tongue every 5 (five) minutes as needed for chest pain.  Current Outpatient Medications (Respiratory):  .  albuterol (PROVENTIL HFA;VENTOLIN HFA) 108 (90 Base) MCG/ACT inhaler, Inhale 2 puffs into the lungs every 6 (six) hours as needed for wheezing or shortness of breath. .  budesonide-formoterol (SYMBICORT) 160-4.5 MCG/ACT inhaler, Inhale 2 puffs into the lungs 2 (two) times daily. Marland Kitchen  levocetirizine (XYZAL) 5 MG tablet, Take 5 mg by mouth every morning.   Current Outpatient Medications (Analgesics):  .  acetaminophen (TYLENOL) 325 MG tablet, Take 650 mg by mouth every 6 (six) hours as needed. Marland Kitchen  aspirin EC 81 MG tablet, Take 1 tablet (81 mg total) by mouth  daily.   Current Outpatient Medications (Other):  Marland Kitchen  ALPRAZolam (XANAX) 1 MG tablet, TAKE ONE TABLET AT BEDTIME AS NEEDED FOR SLEEP .  cyclobenzaprine (FLEXERIL) 10 MG tablet, Take 1 tablet (10 mg total) by mouth 2 (two) times daily as needed for muscle spasms. Marland Kitchen  gabapentin (NEURONTIN) 100 MG capsule, Take 2 capsules (200 mg total) by mouth at  bedtime. .  Magnesium 200 MG TABS, Take 1 tablet (200 mg total) by mouth daily. .  pantoprazole (PROTONIX) 20 MG tablet, Take 1 tablet (20 mg total) by mouth daily. .  pantoprazole (PROTONIX) 40 MG tablet, TAKE ONE TABLET DAILY .  venlafaxine XR (EFFEXOR-XR) 75 MG 24 hr capsule, Take 1 capsule (75 mg total) by mouth daily with breakfast. .  Vitamin D, Ergocalciferol, (DRISDOL) 50000 units CAPS capsule, Take 2,000 Units by mouth daily.     Past medical history, social, surgical and family history all reviewed in electronic medical record.  Perry pertanent information unless stated regarding to the chief complaint.   Review of Systems:  Perry headache, visual changes, nausea, vomiting, diarrhea, constipation, dizziness, abdominal pain, skin rash, fevers, chills, night sweats, weight loss, swollen lymph nodes, body aches, joint swelling, muscle aches, chest pain, shortness of breath, mood changes.   Objective  Blood pressure 132/72, pulse 83, height 5' (1.524 m), weight 162 lb (73.5 kg), SpO2 97 %.     General: Perry apparent distress alert and oriented x3 mood and affect normal, dressed appropriately.  HEENT: Pupils equal, extraocular movements intact  Respiratory: Patient's speak in full sentences and does not appear short of breath  Cardiovascular: Perry lower extremity edema, non tender, Perry erythema  Skin: Warm dry intact with Perry signs of infection or rash on extremities or on axial skeleton.  Abdomen: Soft nontender  Neuro: Cranial nerves II through XII are intact, neurovascularly intact in all extremities with 2+ DTRs and 2+ pulses.  Lymph: Perry  lymphadenopathy of posterior or anterior cervical chain or axillae bilaterally.  Gait normal with good balance and coordination.  MSK:  tender with full range of motion and good stability and symmetric strength and tone of shoulders, elbows, wrist, hip, knee and ankles bilaterally.  Arthritic changes of multiple joints Neck: Inspection loss of lordosis. Perry palpable stepoffs. Negative Spurling's maneuver. Full neck range of motion Grip strength and sensation normal in bilateral hands Strength good C4 to T1 distribution Perry sensory change to C4 to T1 Negative Hoffman sign bilaterally Reflexes normal  Back Exam:  Inspection: Mild loss of lordosis Motion: Flexion 45 deg, Extension 25 deg, Side Bending to 35 deg bilaterally,  Rotation to 35 deg bilaterally  SLR laying: Negative  XSLR laying: Negative  Palpable tenderness: Tender to palpation diffusely in the paraspinal musculature lumbar spine. FABER: negative. Sensory change: Gross sensation intact to all lumbar and sacral dermatomes.  Reflexes: 2+ at both patellar tendons, 2+ at achilles tendons, Babinski's downgoing.  Strength at foot  Plantar-flexion: 5/5 Dorsi-flexion: 5/5 Eversion: 5/5 Inversion: 5/5  Leg strength  Quad: 5/5 Hamstring: 5/5 Hip flexor: 5/5 Hip abductors: 5/5  Gait unremarkable.  Osteopathic findings C2 flexed rotated and side bent right C6 flexed rotated and side bent left T3 extended rotated and side bent right inhaled third rib T9 extended rotated and side bent left L2 flexed rotated and side bent right Sacrum right on right    Impression and Recommendations:     This case required medical decision making of moderate complexity. The above documentation has been reviewed and is accurate and complete [name], DO       Note: This dictation was prepared with Dragon dictation along with smaller phrase technology. Any transcriptional errors that result from this process are unintentional.

## 2019-05-17 NOTE — Patient Instructions (Signed)
Lipid panel Continue vitamins See me again in 6 weeks

## 2019-05-17 NOTE — Assessment & Plan Note (Signed)
Multifactorial.  Arthritic changes.  Doing well with conservative therapy, home is moving her medications at this time.  Patient did need laboratory work-up for lipids secondary to being in a study for a new medication.  We did order this for her today.  Follow-up with me again in 4 to 8 weeks.

## 2019-06-05 ENCOUNTER — Other Ambulatory Visit: Payer: Self-pay | Admitting: Family Medicine

## 2019-06-19 ENCOUNTER — Other Ambulatory Visit: Payer: Self-pay | Admitting: Family Medicine

## 2019-06-21 ENCOUNTER — Other Ambulatory Visit: Payer: Self-pay | Admitting: Family Medicine

## 2019-06-21 DIAGNOSIS — Z1231 Encounter for screening mammogram for malignant neoplasm of breast: Secondary | ICD-10-CM
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
## 2019-06-22 ENCOUNTER — Ambulatory Visit (INDEPENDENT_AMBULATORY_CARE_PROVIDER_SITE_OTHER): Payer: Medicare Other | Admitting: Cardiology

## 2019-06-22 ENCOUNTER — Other Ambulatory Visit: Payer: Self-pay

## 2019-06-22 ENCOUNTER — Encounter: Payer: Self-pay | Admitting: Cardiology

## 2019-06-22 VITALS — BP 136/74 | HR 80 | Ht 60.0 in | Wt 165.8 lb

## 2019-06-22 DIAGNOSIS — I10 Essential (primary) hypertension: Secondary | ICD-10-CM

## 2019-06-22 DIAGNOSIS — Z789 Other specified health status: Secondary | ICD-10-CM

## 2019-06-22 DIAGNOSIS — I739 Peripheral vascular disease, unspecified: Secondary | ICD-10-CM | POA: Diagnosis not present

## 2019-06-22 DIAGNOSIS — I25110 Atherosclerotic heart disease of native coronary artery with unstable angina pectoris: Secondary | ICD-10-CM | POA: Diagnosis not present

## 2019-06-22 DIAGNOSIS — I6529 Occlusion and stenosis of unspecified carotid artery: Secondary | ICD-10-CM

## 2019-06-22 DIAGNOSIS — E785 Hyperlipidemia, unspecified: Secondary | ICD-10-CM

## 2019-06-22 DIAGNOSIS — E782 Mixed hyperlipidemia: Secondary | ICD-10-CM

## 2019-06-22 LAB — HEPATIC FUNCTION PANEL
ALT: 29 IU/L (ref 0–32)
AST: 24 IU/L (ref 0–40)
Albumin: 4.5 g/dL (ref 3.7–4.7)
Alkaline Phosphatase: 93 IU/L (ref 39–117)
Bilirubin Total: <0.2 mg/dL (ref 0.0–1.2)
Bilirubin, Direct: 0.11 mg/dL (ref 0.00–0.40)
Total Protein: 7.2 g/dL (ref 6.0–8.5)

## 2019-06-22 LAB — LIPID PANEL
Chol/HDL Ratio: 3.1 ratio (ref 0.0–4.4)
Cholesterol, Total: 100 mg/dL (ref 100–199)
HDL: 32 mg/dL — ABNORMAL LOW (ref >39)
Triglycerides: 357 mg/dL — ABNORMAL HIGH (ref 0–149)
VLDL Cholesterol Cal: 71 mg/dL — ABNORMAL HIGH (ref 5–40)

## 2019-06-22 LAB — CBC WITH DIFFERENTIAL/PLATELET
Basophils Absolute: 0.2 10*3/uL (ref 0.0–0.2)
Basos: 2 %
EOS (ABSOLUTE): 0.4 10*3/uL (ref 0.0–0.4)
Eos: 4 %
Hematocrit: 42.1 % (ref 34.0–46.6)
Hemoglobin: 13.9 g/dL (ref 11.1–15.9)
Immature Grans (Abs): 0.1 10*3/uL (ref 0.0–0.1)
Immature Granulocytes: 1 %
Lymphocytes Absolute: 2.6 10*3/uL (ref 0.7–3.1)
Lymphs: 25 %
MCH: 29.9 pg (ref 26.6–33.0)
MCHC: 33 g/dL (ref 31.5–35.7)
MCV: 91 fL (ref 79–97)
Monocytes Absolute: 1.2 10*3/uL — ABNORMAL HIGH (ref 0.1–0.9)
Monocytes: 11 %
Neutrophils Absolute: 6.1 10*3/uL (ref 1.4–7.0)
Neutrophils: 57 %
Platelets: 304 10*3/uL (ref 150–450)
RBC: 4.65 x10E6/uL (ref 3.77–5.28)
RDW: 12 % (ref 11.7–15.4)
WBC: 10.6 10*3/uL (ref 3.4–10.8)

## 2019-06-22 MED ORDER — OMEGA-3-ACID ETHYL ESTERS 1 G PO CAPS: 2.0000 g | ORAL_CAPSULE | Freq: Every day | ORAL | 11 refills | Status: DC

## 2019-06-22 MED ORDER — AMLODIPINE BESYLATE 10 MG PO TABS: 10.0000 mg | ORAL_TABLET | Freq: Every day | ORAL | 3 refills | Status: DC

## 2019-06-22 MED ORDER — IRBESARTAN 300 MG PO TABS: 300.0000 mg | ORAL_TABLET | Freq: Every day | ORAL | 2 refills | Status: DC

## 2019-06-22 MED ORDER — METOPROLOL SUCCINATE ER 50 MG PO TB24: 50.0000 mg | ORAL_TABLET | Freq: Every day | ORAL | 2 refills | Status: DC

## 2019-06-22 MED ORDER — EZETIMIBE 10 MG PO TABS: 10.0000 mg | ORAL_TABLET | Freq: Every day | ORAL | 2 refills | Status: DC

## 2019-06-22 NOTE — Patient Instructions (Signed)
Medication Instructions:   Your physician recommends that you continue on your current medications as directed. Please refer to the Current Medication list given to you today.  If you need a refill on your cardiac medications before your next appointment, please call your pharmacy.    Lab work:  TODAY-LIPIDS, CBC W DIFF, AND LFTS  If you have labs (blood work) drawn today and your tests are completely normal, you will receive your results only by: Marland Kitchen MyChart Message (if you have MyChart) OR . A paper copy in the mail If you have any lab test that is abnormal or we need to change your treatment, we will call you to review the results.    Testing/Procedures:  Your physician has requested that you have a carotid duplex. This test is an ultrasound of the carotid arteries in your neck. It looks at blood flow through these arteries that supply the brain with blood. Allow one hour for this exam. There are no restrictions or special instructions.    Follow-Up: At [facility], you and your health needs are our priority.  As part of our continuing mission to provide you with exceptional heart care, we have created designated Provider Care Teams.  These Care Teams include your primary Cardiologist (physician) and Advanced Practice Providers (APPs -  Physician Assistants and Nurse Practitioners) who all work together to provide you with the care you need, when you need it. You will need a follow up appointment in 12 months.  Please call our office 2 months in advance to schedule this appointment.  You may see Ena Dawley, MD or one of the following Advanced Practice Providers on your designated Care Team:   Rennert, PA-C Melina Copa, PA-C . Ermalinda Barrios, PA-C

## 2019-06-23 ENCOUNTER — Other Ambulatory Visit: Payer: Self-pay | Admitting: Cardiology

## 2019-06-23 DIAGNOSIS — I6529 Occlusion and stenosis of unspecified carotid artery: Secondary | ICD-10-CM

## 2019-06-23 DIAGNOSIS — I739 Peripheral vascular disease, unspecified: Secondary | ICD-10-CM

## 2019-06-24 LAB — SPECIMEN STATUS REPORT

## 2019-06-24 LAB — LDL CHOLESTEROL, DIRECT: LDL Direct: 31 mg/dL (ref 0–99)

## 2019-06-26 ENCOUNTER — Telehealth: Payer: Self-pay | Admitting: Pharmacist

## 2019-06-26 NOTE — Telephone Encounter (Signed)
Direct LDL back at 31, much improved from 76 since starting Nexletol in July. However, TG remain elevated. Left message for pt, will discuss changing Lovaza 2g daily to higher dose of Vascepa 2g BID for cardiovascular benefit and better TG lowering.

## 2019-06-26 NOTE — Telephone Encounter (Signed)
Spoke with pt, she reports compliance to Nexletol. Reports when she started Nexletol, she experienced itching "from head to toe" for the first week. She took Benadryl and used hydrocortisone cream and the itching improved. Then she developed a rash on her shoulders. She used hydrocortisone cream and this has since resolved as well. She does note joint pain but this is in 1 hip and she follows up with her orthopedic MD this week.  She wishes to continue Nexletol therapy, side effects have resolved and unilateral joint pain is not likely related to Nexletol (joint pain with lipid meds tends to be bilateral).  She also states she was not fasting for her 2 most recent lipid panels which explains her elevated TG - she drank a Boost and had a sausage biscuit before her most recent labs. She sees her PCP in 1 month who will draw labs - advised her to fast before this lab draw for more accurate TG. Will follow up with pt as needed.

## 2019-06-26 NOTE — Progress Notes (Signed)
[name] [address] [place], [place] [address] Phone: [phone] Subjective:   I [name] am serving as a Education administrator for Dr. Hulan Saas.  I'm seeing this patient by the request  of:    CC: Neck and back pain follow-up  [id number]  Felicia Perry is a 73 y.o. female coming in with complaint of neck and back pain. States everything hurts. Left leg is painful in the calf for about 3 weeks. On a new trial drug and believes it may be a side effect. Issues with sleeping at night due to left leg pain. Patient is discouraged due to pain. States she is not the same as she was about 6 months ago. Balance is off.  Still does not feel like herself.  Awaiting the echocardiogram as well as the carotid Dopplers.     Past Medical History:  Diagnosis Date  . Adenomatous polyp 11/23/2005  . Anxiety   . Chronic diastolic CHF (congestive heart failure) ([place])   . Coronary artery disease    a. HC on 08/14/16 showed RCA CTO s/p PCI, 60% LCX disease managed medically with recs to consider staged PCI if continued symptoms.   Marland Kitchen DIVERTICULOSIS, COLON 04/22/2007       . GERD (gastroesophageal reflux disease)   . History of shingles   . Hypertension   . Hypertensive heart disease   . Internal hemorrhoids   . Osteoarthritis    "knees, hands, lower back" (08/14/2016)  . PUD (peptic ulcer disease)   . Sleep apnea    a. resolved with weight loss   Past Surgical History:  Procedure Laterality Date  . BREAST EXCISIONAL BIOPSY    . BREAST SURGERY Left 1980s   Fibrous Tumors removed   . CARDIAC CATHETERIZATION N/A 08/14/2016   Procedure: Left Heart Cath and Coronary Angiography;  Surgeon: Sherren Mocha, MD;  Location: [facility];  Service: Cardiovascular;  Laterality: N/A;  . CARDIAC CATHETERIZATION N/A 08/14/2016   Procedure: Coronary Stent Intervention;  Surgeon: Sherren Mocha, MD;  Location: [facility];  Service: Cardiovascular;  Laterality:  N/A;  . CATARACT EXTRACTION W/ INTRAOCULAR LENS  IMPLANT, BILATERAL Bilateral 05/2009  . CORONARY ANGIOPLASTY    . detached retina left eye     may 2019  . TUBAL LIGATION  1970s   Social History   Socioeconomic History  . Marital status: Divorced    Spouse name: Not on file  . Number of children: Not on file  . Years of education: Not on file  . Highest education level: Not on file  Occupational History  . Occupation: Education administrator  Social Needs  . Financial resource strain: Not on file  . Food insecurity    Worry: Not on file    Inability: Not on file  . Transportation needs    Medical: Not on file    Non-medical: Not on file  Tobacco Use  . Smoking status: Former Smoker    Packs/day: 1.00    Years: 26.00    Pack years: 26.00    Types: Cigarettes    Quit date: 1989    Years since quitting: 31.6  . Smokeless tobacco: Never Used  Substance and Sexual Activity  . Alcohol use: No  . Drug use: No  . Sexual activity: Not Currently  Lifestyle  . Physical activity    Days per week: Not on file    Minutes per session: Not on file  . Stress: Not on file  Relationships  . Social Herbalist on phone: Not on file    Gets together: Not on file    Attends religious service: Not on file    Active member of club or organization: Not on file    Attends meetings of clubs or organizations: Not on file    Relationship status: Not on file  Other Topics Concern  . Not on file  Social History Narrative   Divorced for 15 years in 2016. 2 sons- 1 son had been in prison now living with her in 2019.  2 granddaughters with 1 living close.        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       Hobbies: watch tv-survivor, dancing with the stars, enjoy alone time. Best friend [name] patient of Dr. Yong Channel. Enjoys work, time on Teaching laboratory technician.    Allergies  Allergen Reactions  . Celexa [Citalopram Hydrobromide]     psychosis  . Cephalosporins      REACTION: tongue swelling  . Clarithromycin     REACTION: blisters in mouth  . Doxycycline Hyclate     REACTION: nausea, vomiting  . Levofloxacin     REACTION: tongue swells  . Penicillins     REACTION: per patient causes rash,hives  . Rosuvastatin Other (See Comments)    Pt reports causes bilateral lower extremity muscle aches.   . Zolpidem Tartrate     REACTION: difficulty with concentration   Family History  Problem Relation Age of Onset  . Heart disease Mother   . Hypertension Mother   . Diabetes Mother   . COPD Father   . Colon cancer Neg Hx      Current Outpatient Medications (Cardiovascular):  .  amLODipine (NORVASC) 10 MG tablet, Take 1 tablet (10 mg total) by mouth daily. .  Bempedoic Acid (NEXLETOL) 180 MG TABS, Take 1 tablet by mouth daily. Marland Kitchen  ezetimibe (ZETIA) 10 MG tablet, Take 1 tablet (10 mg total) by mouth daily. .  irbesartan (AVAPRO) 300 MG tablet, Take 1 tablet (300 mg total) by mouth daily. .  metoprolol succinate (TOPROL-XL) 50 MG 24 hr tablet, Take 1 tablet (50 mg total) by mouth daily. Take with or immediately following a meal. .  nitroGLYCERIN (NITROSTAT) 0.4 MG SL tablet, Place 1 tablet (0.4 mg total) under the tongue every 5 (five) minutes as needed for chest pain. Marland Kitchen  omega-3 acid ethyl esters (LOVAZA) 1 g capsule, Take 2 capsules (2 g total) by mouth daily.  Current Outpatient Medications (Respiratory):  .  albuterol (PROVENTIL HFA;VENTOLIN HFA) 108 (90 Base) MCG/ACT inhaler, Inhale 2 puffs into the lungs every 6 (six) hours as needed for wheezing or shortness of breath. .  budesonide-formoterol (SYMBICORT) 160-4.5 MCG/ACT inhaler, Inhale 2 puffs into the lungs as needed. Marland Kitchen  levocetirizine (XYZAL) 5 MG tablet, TAKE ONE TABLET EVERY MORNING  Current Outpatient Medications (Analgesics):  .  acetaminophen (TYLENOL) 325 MG tablet, Take 650 mg by mouth every 6 (six) hours as needed. Marland Kitchen  aspirin EC 81 MG tablet, Take 1 tablet (81 mg total) by mouth  daily.   Current Outpatient Medications (Other):  Marland Kitchen  ALPRAZolam (XANAX) 1 MG tablet, TAKE ONE TABLET AT BEDTIME AS NEEDED FOR SLEEP .  Magnesium 200 MG TABS, Take 1 tablet (200 mg total) by mouth daily. .  pantoprazole (PROTONIX) 20 MG tablet, Take 1 tablet (20 mg total) by mouth daily. Marland Kitchen  venlafaxine XR (EFFEXOR-XR) 75  MG 24 hr capsule, TAKE ONE CAPSULE EACH DAY WITH BREAKFAST .  Vitamin D, Ergocalciferol, (DRISDOL) 50000 units CAPS capsule, Take 2,000 Units by mouth daily.     Past medical history, social, surgical and family history all reviewed in electronic medical record.  No pertanent information unless stated regarding to the chief complaint.   Review of Systems:  No headache, visual changes, nausea, vomiting, diarrhea, constipation, dizziness, abdominal pain, skin rash, fevers, chills, night sweats, weight loss, swollen lymph nodes, body aches, joint swelling, muscle aches, chest pain, shortness of breath, mood changes.   Objective  There were no vitals taken for this visit. Systems examined below as of    General: No apparent distress alert and oriented x3 mood and affect normal, dressed appropriately.  HEENT: Pupils equal, extraocular movements intact  Respiratory: Patient's speak in full sentences and does not appear short of breath  Cardiovascular: No lower extremity edema, non tender, no erythema  Skin: Warm dry intact with no signs of infection or rash on extremities or on axial skeleton.  Abdomen: Soft nontender  Neuro: Cranial nerves II through XII are intact, neurovascularly intact in all extremities with 2+ DTRs and 2+ pulses.  Lymph: No lymphadenopathy of posterior or anterior cervical chain or axillae bilaterally.  Gait antalgic MSK:  Non tender with full range of motion and good stability and symmetric strength and tone of shoulders, elbows, wrist, hip, knee and ankles bilaterally.  Neck: Inspection mild loss of lordosis. No palpable stepoffs. Negative  Spurling's maneuver. Full neck range of motion Grip strength and sensation normal in bilateral hands Strength good C4 to T1 distribution No sensory change to C4 to T1 Negative Hoffman sign bilaterally Reflexes normal Tightness in the trapezius bilaterally  Patient's low back exam has significant tightness noted today.  The patient does have some mild tightness of straight leg test on the left side.  Mild positive Corky Sox on the left as well.  Osteopathic findings  C4 flexed rotated and side bent left T3 extended rotated and side bent right inhaled third rib T7 extended rotated and side bent left L2 flexed rotated and side bent right Sacrum right on right    Impression and Recommendations:     This case required medical decision making of moderate complexity. The above documentation has been reviewed and is accurate and complete [name], DO       Note: This dictation was prepared with Dragon dictation along with smaller phrase technology. Any transcriptional errors that result from this process are unintentional.

## 2019-06-27 ENCOUNTER — Other Ambulatory Visit: Payer: Self-pay

## 2019-06-27 ENCOUNTER — Ambulatory Visit (INDEPENDENT_AMBULATORY_CARE_PROVIDER_SITE_OTHER)
Admission: RE | Admit: 2019-06-27 | Discharge: 2019-06-27 | Disposition: A | Discharge status: Home / Self Care | Payer: Medicare Other | Source: Ambulatory Visit | Attending: Family Medicine | Admitting: Family Medicine

## 2019-06-27 ENCOUNTER — Ambulatory Visit: Payer: Medicare Other | Admitting: Family Medicine

## 2019-06-27 ENCOUNTER — Encounter: Payer: Self-pay | Admitting: Family Medicine

## 2019-06-27 VITALS — BP 148/72 | HR 88 | Ht 60.0 in | Wt 165.0 lb

## 2019-06-27 DIAGNOSIS — G8929 Other chronic pain: Secondary | ICD-10-CM

## 2019-06-27 DIAGNOSIS — M999 Biomechanical lesion, unspecified: Secondary | ICD-10-CM | POA: Diagnosis not present

## 2019-06-27 DIAGNOSIS — M549 Dorsalgia, unspecified: Secondary | ICD-10-CM

## 2019-06-27 DIAGNOSIS — M5030 Other cervical disc degeneration, unspecified cervical region: Secondary | ICD-10-CM

## 2019-06-27 DIAGNOSIS — M5136 Other intervertebral disc degeneration, lumbar region: Secondary | ICD-10-CM | POA: Diagnosis not present

## 2019-06-27 MED ORDER — KETOROLAC TROMETHAMINE 60 MG/2ML IM SOLN
60.0000 mg | Freq: Once | INTRAMUSCULAR | Status: AC
  Administered 2019-06-27: 60 mg via INTRAMUSCULAR

## 2019-06-27 MED ORDER — METHYLPREDNISOLONE ACETATE 80 MG/ML IJ SUSP
80.0000 mg | Freq: Once | INTRAMUSCULAR | Status: AC
  Administered 2019-06-27: 80 mg via INTRAMUSCULAR

## 2019-06-27 NOTE — Assessment & Plan Note (Signed)
Discussed posture and ergonomics, discussed which activities to do which wants to avoid.  Increase activity as tolerated.  Follow-up again in 4 to 8 weeks

## 2019-06-27 NOTE — Assessment & Plan Note (Signed)
Decision today to treat with OMT was based on Physical Exam  After verbal consent patient was treated with HVLA, ME, FPR techniques in cervical, thoracic, rib lumbar and sacral areas  Patient tolerated the procedure well with improvement in symptoms  Patient given exercises, stretches and lifestyle modifications  See medications in patient instructions if given  Patient will follow up in 4-8 weeks 

## 2019-06-27 NOTE — Patient Instructions (Signed)
Continue the meds Keep a food journal See me again in 4-6 weeks

## 2019-06-28 ENCOUNTER — Other Ambulatory Visit: Payer: Self-pay

## 2019-06-28 ENCOUNTER — Ambulatory Visit (HOSPITAL_COMMUNITY)
Admission: RE | Admit: 2019-06-28 | Discharge: 2019-06-28 | Disposition: A | Discharge status: Home / Self Care | Payer: Medicare Other | Source: Ambulatory Visit | Attending: Cardiology | Admitting: Cardiology

## 2019-06-28 ENCOUNTER — Other Ambulatory Visit: Payer: Self-pay | Admitting: Cardiology

## 2019-06-28 DIAGNOSIS — I739 Peripheral vascular disease, unspecified: Secondary | ICD-10-CM | POA: Diagnosis not present

## 2019-06-28 DIAGNOSIS — I6529 Occlusion and stenosis of unspecified carotid artery: Secondary | ICD-10-CM | POA: Diagnosis not present

## 2019-06-29 ENCOUNTER — Telehealth: Payer: Self-pay | Admitting: *Deleted

## 2019-06-29 DIAGNOSIS — I25110 Atherosclerotic heart disease of native coronary artery with unstable angina pectoris: Secondary | ICD-10-CM

## 2019-06-29 DIAGNOSIS — I739 Peripheral vascular disease, unspecified: Secondary | ICD-10-CM

## 2019-06-29 DIAGNOSIS — I6529 Occlusion and stenosis of unspecified carotid artery: Secondary | ICD-10-CM

## 2019-06-29 NOTE — Telephone Encounter (Signed)
-----   Message from Dorothy Spark, MD sent at 06/28/2019  2:58 PM EDT ----- Right Carotid: Velocities in the right ICA are consistent with a 1-39% stenosis.  Left Carotid: Velocities in the left ICA are consistent with a 40-59% stenosis  Stable, repeat in 1 year

## 2019-06-29 NOTE — Telephone Encounter (Signed)
Spoke with the pt and endorsed to her, her carotid doppler results and recommendations per Dr Meda Coffee.  Informed the pt that I will place the order for the repeat carotid to be done in one year, and send a message to our [facility] schedulers to call the pt back and arrange this appt. Pt verbalized understanding and agrees with this plan.

## 2019-07-10 DIAGNOSIS — Z961 Presence of intraocular lens: Secondary | ICD-10-CM | POA: Diagnosis not present

## 2019-07-10 DIAGNOSIS — H43822 Vitreomacular adhesion, left eye: Secondary | ICD-10-CM | POA: Diagnosis not present

## 2019-07-14 ENCOUNTER — Other Ambulatory Visit: Payer: Self-pay | Admitting: Family Medicine

## 2019-07-17 NOTE — Telephone Encounter (Signed)
Last OV 01/12/19 Last refill 01/12/19 #90/1 Next OV 07/25/19  Forwarding to Dr. Yong Channel.

## 2019-07-19 ENCOUNTER — Telehealth: Payer: Self-pay | Admitting: Family Medicine

## 2019-07-19 NOTE — Telephone Encounter (Signed)
I left a message asking the patient to call me at [phone] to schedule AWV with Loma Sousa on 07/25/2019 if available.  Im waiting for a call back to either confirm or decline the appointment. [name] (Dee-Dee)

## 2019-07-24 NOTE — Patient Instructions (Addendum)
Health Maintenance Due  Topic Date Due  . INFLUENZA VACCINE-high-dose flu shot today 05/27/2019   Please check with your pharmacy to see if they have the shingrix vaccine. If they do- please get this immunization and update [place] by phone call or mychart with dates you receive the vaccine  Schedule your bone density test at check out desk. You may also call directly to X-ray at [phone] to schedule an appointment that is convenient for you.  - located [address] across the street from [place] - in the basement - you do need an appointment for the bone density tests.    Please stop by lab before you go If you do not have mychart- we will call you about results within 5 business days of [place] receiving them.  If you have mychart- we will send your results within 3 business days of [place] receiving them.  If abnormal or we want to clarify a result, we will call or mychart you to make sure you receive the message.  If you have questions or concerns or don't hear within 5-7 days, please send [place] a message or call us.

## 2019-07-24 NOTE — Progress Notes (Signed)
Phone: [phone]   Subjective:  Patient presents today for their annual physical. Chief complaint-noted.   See problem oriented charting- ROS- full  review of systems was completed and negative except for: Activity change, visual problems (sees eye doctor history of detached retina), cough seasonally, shortness of breath seasonally, cold intolerance, increased eating as bored with COVID, joint pain, back pain, joint swelling, neck pain, seasonal allergies, numbness in fingers at times, easy bruising bleeding, sleep disturbance  The following were reviewed and entered/updated in epic: Past Medical History:  Diagnosis Date   Adenomatous polyp 11/23/2005   Anxiety    Chronic diastolic CHF (congestive heart failure) ([place])    Coronary artery disease    a. HC on 08/14/16 showed RCA CTO s/p PCI, 60% LCX disease managed medically with recs to consider staged PCI if continued symptoms.    DIVERTICULOSIS, COLON 04/22/2007        GERD (gastroesophageal reflux disease)    History of shingles    Hypertension    Hypertensive heart disease    Internal hemorrhoids    Osteoarthritis    "knees, hands, lower back" (08/14/2016)   PUD (peptic ulcer disease)    Sleep apnea    a. resolved with weight loss   Patient Active Problem List   Diagnosis Date Noted   Coronary artery disease     Priority: High   Syncope 08/04/2016    Priority: High   Hyperlipidemia, unspecified 07/11/2018    Priority: Medium   H/O hyperparathyroidism 08/18/2016    Priority: Medium   Vitamin D deficiency 08/20/2015    Priority: Medium   Chronic pain syndrome 03/26/2015    Priority: Medium   Insomnia 07/13/2014    Priority: Medium   GERD (gastroesophageal reflux disease) 02/25/2011    Priority: Medium   Depression 04/22/2007    Priority: Medium   Essential hypertension 04/22/2007    Priority: Medium   Asthma 04/22/2007    Priority: Medium   Nonallopathic lesion of sacral region  07/05/2018    Priority: Low   Nonallopathic lesion of cervical region 07/05/2018    Priority: Low   Degenerative arthritis of left knee 09/15/2017    Priority: Low   Hyperglycemia 08/06/2015    Priority: Low   Xerosis of skin 08/06/2015    Priority: Low   Hypersomnia with sleep apnea 03/26/2015    Priority: Low   Nonallopathic lesion of lumbosacral region 11/13/2014    Priority: Low   Osteoarthritis of left lower extremity 10/17/2014    Priority: Low   Chronic meniscal tear of knee 10/17/2014    Priority: Low   Nonallopathic lesion of thoracic region 09/14/2014    Priority: Low   Nonallopathic lesion-rib cage 08/21/2014    Priority: Low   Tendinopathy of right rotator cuff 07/30/2014    Priority: Low   Piriformis syndrome of right side 07/30/2014    Priority: Low   Limited joint range of motion 07/13/2014    Priority: Low   Rash 07/13/2014    Priority: Low   SI (sacroiliac) joint dysfunction 10/10/2007    Priority: Low   LOW BACK PAIN 07/01/2007    Priority: Low   Chronic diastolic CHF (congestive heart failure) ([place]) 01/12/2019   Degenerative arthritis of knee, bilateral 12/08/2018   Partial hamstring tear, initial encounter 11/16/2018   DDD (degenerative disc disease), cervical 05/04/2018   DOE (dyspnea on exertion) 09/02/2016   PUD (peptic ulcer disease)    Enlarged lymph nodes 05/19/2016   Past Surgical History:  Procedure Laterality Date   BREAST EXCISIONAL BIOPSY     BREAST SURGERY Left 1980s   Fibrous Tumors removed    CARDIAC CATHETERIZATION N/A 08/14/2016   Procedure: Left Heart Cath and Coronary Angiography;  Surgeon: Sherren Mocha, MD;  Location: [facility];  Service: Cardiovascular;  Laterality: N/A;   CARDIAC CATHETERIZATION N/A 08/14/2016   Procedure: Coronary Stent Intervention;  Surgeon: Sherren Mocha, MD;  Location: [facility];  Service: Cardiovascular;  Laterality: N/A;   CATARACT EXTRACTION W/  INTRAOCULAR LENS  IMPLANT, BILATERAL Bilateral 05/2009   CORONARY ANGIOPLASTY     detached retina left eye     may 2019   TUBAL LIGATION  1970s    Family History  Problem Relation Age of Onset   Heart disease Mother    Hypertension Mother    Diabetes Mother    COPD Father    Colon cancer Neg Hx     Medications- reviewed and updated Current Outpatient Medications  Medication Sig Dispense Refill   acetaminophen (TYLENOL) 325 MG tablet Take 650 mg by mouth every 6 (six) hours as needed.     albuterol (PROVENTIL HFA;VENTOLIN HFA) 108 (90 Base) MCG/ACT inhaler Inhale 2 puffs into the lungs every 6 (six) hours as needed for wheezing or shortness of breath. 1 Inhaler 5   ALPRAZolam (XANAX) 1 MG tablet TAKE ONE TABLET AT BEDTIME AS NEEDED FORSLEEP 90 tablet 0   amLODipine (NORVASC) 10 MG tablet Take 1 tablet (10 mg total) by mouth daily. 90 tablet 3   aspirin EC 81 MG tablet Take 1 tablet (81 mg total) by mouth daily. 90 tablet 3   Bempedoic Acid (NEXLETOL) 180 MG TABS Take 1 tablet by mouth daily. 30 tablet 11   budesonide-formoterol (SYMBICORT) 160-4.5 MCG/ACT inhaler Inhale 2 puffs into the lungs as needed.     ezetimibe (ZETIA) 10 MG tablet Take 1 tablet (10 mg total) by mouth daily. 90 tablet 2   irbesartan (AVAPRO) 300 MG tablet Take 1 tablet (300 mg total) by mouth daily. 90 tablet 2   levocetirizine (XYZAL) 5 MG tablet TAKE ONE TABLET EVERY MORNING 90 tablet 0   Magnesium 200 MG TABS Take 1 tablet (200 mg total) by mouth daily. 30 each    metoprolol succinate (TOPROL-XL) 50 MG 24 hr tablet Take 1 tablet (50 mg total) by mouth daily. Take with or immediately following a meal. 90 tablet 2   omega-3 acid ethyl esters (LOVAZA) 1 g capsule Take 2 capsules (2 g total) by mouth daily. 60 capsule 11   pantoprazole (PROTONIX) 20 MG tablet Take 1 tablet (20 mg total) by mouth daily. 90 tablet 3   venlafaxine XR (EFFEXOR-XR) 75 MG 24 hr capsule TAKE ONE CAPSULE EACH DAY  WITH BREAKFAST 90 capsule 0   Vitamin D, Ergocalciferol, (DRISDOL) 50000 units CAPS capsule Take 2,000 Units by mouth daily.      nitroGLYCERIN (NITROSTAT) 0.4 MG SL tablet Place 1 tablet (0.4 mg total) under the tongue every 5 (five) minutes as needed for chest pain. 25 tablet 6   No current facility-administered medications for this visit.     Allergies-reviewed and updated Allergies  Allergen Reactions   Celexa [Citalopram Hydrobromide]     psychosis   Cephalosporins     REACTION: tongue swelling   Clarithromycin     REACTION: blisters in mouth   Doxycycline Hyclate     REACTION: nausea, vomiting   Levofloxacin     REACTION: tongue swells  Penicillins     REACTION: per patient causes rash,hives   Rosuvastatin Other (See Comments)    Pt reports causes bilateral lower extremity muscle aches.    Zolpidem Tartrate     REACTION: difficulty with concentration    Social History   Social History Narrative   Divorced for 78 years in 2016. 2 sons- 1 son had been in prison now living with her in 2019.  2 granddaughters with 1 living close.        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       Hobbies: watch tv-survivor, dancing with the stars, enjoy alone time. Best friend [name] patient of Dr. Yong Channel. Enjoys work, time on Teaching laboratory technician.    Objective  Objective:  BP 110/70 Comment: most recent home reading when on BP med. 140/84 in office today but had not taken BP meds   Pulse 73    Temp 97.9 F (36.6 C)    Ht 5' (1.524 m)    Wt 161 lb (73 kg)    SpO2 99%    BMI 31.44 kg/m  Gen: NAD, resting comfortably HEENT: Mucous membranes are moist. Oropharynx normal Neck: no thyromegaly or cervical lympahdenopathy CV: RRR no murmurs rubs or gallops Lungs: CTAB no crackles, wheeze, rhonchi Abdomen: soft/nontender/nondistended/normal bowel sounds. No rebound or guarding.  Ext: no edema and 2+ PT pulses Skin: warm, dry Neuro: grossly  normal, moves all extremities, PERRLA    Assessment and Plan   73 y.o. female presenting for annual physical.  Health Maintenance counseling: 1. Anticipatory guidance: Patient counseled regarding regular dental exams -q6 months, eye exams - yearly,  avoiding smoking and second hand smoke , limiting alcohol to 1 beverage per day- doesn't drink .   2. Risk factor reduction:  Advised patient of need for regular exercise and diet rich and fruits and vegetables to reduce risk of heart attack and stroke. 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 Diet- has done a great job keeping weight down- down 4 lbs in last month- had some weight gain from being bored with covid- was able to reverse this. .  Wt Readings from Last 3 Encounters:  07/25/19 161 lb (73 kg)  06/27/19 165 lb (74.8 kg)  06/22/19 165 lb 12.8 oz (75.2 kg)  3. Immunizations/screenings/ancillary studies-flu shot today.  Discussed Shingrix at pharmacy  Immunization History  Administered Date(s) Administered   Influenza Split 09/21/2011, 07/07/2012   Influenza Whole 08/10/2008, 07/30/2010   Influenza, High Dose Seasonal PF 08/04/2016, 07/05/2018   Influenza,inj,Quad PF,6+ Mos 07/21/2013, 08/06/2015   Influenza-Unspecified 07/10/2014, 10/01/2017   Pneumococcal Conjugate-13 01/05/2014   Pneumococcal Polysaccharide-23 02/08/2015   Td 10/26/2001   Tetanus 01/05/2014  4. Cervical cancer screening- past age based screening recommendations-denies history of abnormals  5. Breast cancer screening-  mammogram  04/27/18 and scheduled 08/08/2019.  6. Colon cancer screening - May 20, 2018 with 3-year repeat Cologuard 7. Skin cancer screening-referred to dermatology last year for skin cancer screening- she states covid got in way of referral- asks for another today and this was placed. advised regular sunscreen use. Denies worrisome, changing, or new skin lesions.  8. Birth control/STD check-  postmenopausal/not sexually active. Would use condoms if active- she is dating.  9. Osteoporosis screening at 65- repeat bone density ordered last year. She is on vitamin D- had history high calcium and is off calcium.  -Former smoker-26 pack years-quit in  1980s.  No regular screening required  Status of chronic or acute concerns   Chronic joint pain- follows with Dr. Tamala Julian- for knees, hip, back, left side of leg bothers her, neck. They are considering epidurals for neck area- with numbness in fingers.   Seasonal allergies- occasional nose runs if cleaning or around dogs. On xyzal  Hyperglycemia-update A1c but with weight loss over the years do not suspect this will be an issue Lab Results  Component Value Date   HGBA1C 6.0 01/12/2019   Hypertension- controlled on metoprolol 50 mg extended release, irbesartan 300 mg, amlodipine 10 mg at home - usually 110/70. Today slightly high in office 140/84 but had not taken meds yet. Has white coat element.  BP Readings from Last 3 Encounters:  07/25/19 110/70  06/27/19 (!) 148/72  06/22/19 136/74   History of hyperparathyroidism-saw Dr. Cruzita Lederer a few years ago.  Calcium has been okay as long vitamin D is been okay. Think issue was related to low D- check vitamin D levels today  Depression/insomnia/anxiety- controlled on Effexor 75 mg with phq9 of 2 (she states issues have been more anxiety/stress- denies depression)- tried to come off in December but had joint pain, loss of taste/smell-  Wonders if had covid.  Xanax helps with sleep significantly-she is down from 1 mg to 0.5 mg- has not been able to do 0.5 mg  GERD- on protonix for reflux  CAD/hyperlipidemia- on trial for bempedoic acid- just had lipid panel in august and we will defer today. She is also on zetia. Also takes lovaza. Also on aspirin 81 mg. No chest pain or shortness of breath recently (outside the asthma) Lab Results  Component Value Date   CHOL 100 06/22/2019   HDL 32 (L)  06/22/2019   LDLCALC Comment (A) 06/22/2019   LDLDIRECT 31 06/22/2019   TRIG 357 (H) 06/22/2019   CHOLHDL 3.1 06/22/2019   Carotid stenosis- followed by Dr. Meda Coffee- last checked 06/28/2019- was stable  On B12 deficiency- patient took weekly injections for a month and then has been on oral B12  Asthma-doing well recently.  Symbicort and winter months usually- wetnt ahead and started last week. Never using albuterol fortunately   Diastolic CHF- listed as a medical problem in the past.  No longer requiring Lasix.  Stable/coninue to monitor.   Recommended follow up: 6 months follow up  Future Appointments  Date Time Provider [place]  07/25/2019  4:00 PM [name], DO [id number]  08/08/2019  3:00 PM [facility] MM 3 [facility]  06/27/2020  8:00 AM [facility] VASC 4 [facility]   Lab/Order associations: fasting   ICD-10-CM   1. Preventative health care  Z00.00 CBC    Comprehensive metabolic panel    Hemoglobin A1c    VITAMIN D 25 Hydroxy (Vit-D Deficiency, Fractures)    Vitamin B12    Ambulatory referral to Dermatology    DG Bone Density  2. Coronary artery disease involving native coronary artery of native heart with unstable angina pectoris ([place])  I25.110   3. Hyperlipidemia, unspecified hyperlipidemia type  E78.5 CBC    Comprehensive metabolic panel  4. Vitamin D deficiency  E55.9 VITAMIN D 25 Hydroxy (Vit-D Deficiency, Fractures)  5. Primary insomnia  F51.01   6. Depression, unspecified depression type  F32.9   7. Essential hypertension  I10 CBC    Comprehensive metabolic panel  8. Hyperglycemia  R73.9 Hemoglobin A1c  9. Chronic diastolic CHF (congestive heart failure) ([facility])  I50.32  10. B12 deficiency  E53.8 Vitamin B12  11. Osteopenia of neck of left femur  M85.852 DG Bone Density  12. Screening exam for skin cancer  Z12.83 Ambulatory referral to Dermatology   Return precautions advised.  Garret Reddish, MD

## 2019-07-25 ENCOUNTER — Ambulatory Visit (INDEPENDENT_AMBULATORY_CARE_PROVIDER_SITE_OTHER): Payer: Medicare Other

## 2019-07-25 ENCOUNTER — Encounter: Payer: Self-pay | Admitting: Family Medicine

## 2019-07-25 ENCOUNTER — Ambulatory Visit: Payer: Medicare Other | Admitting: Family Medicine

## 2019-07-25 ENCOUNTER — Other Ambulatory Visit: Payer: Self-pay

## 2019-07-25 ENCOUNTER — Ambulatory Visit (INDEPENDENT_AMBULATORY_CARE_PROVIDER_SITE_OTHER): Payer: Medicare Other | Admitting: Family Medicine

## 2019-07-25 VITALS — BP 140/84 | Temp 97.9°F | Ht 60.0 in | Wt 160.9 lb

## 2019-07-25 VITALS — BP 110/70 | HR 73 | Temp 97.9°F | Ht 60.0 in | Wt 161.0 lb

## 2019-07-25 DIAGNOSIS — E785 Hyperlipidemia, unspecified: Secondary | ICD-10-CM | POA: Diagnosis not present

## 2019-07-25 DIAGNOSIS — Z0000 Encounter for general adult medical examination without abnormal findings: Secondary | ICD-10-CM

## 2019-07-25 DIAGNOSIS — I25110 Atherosclerotic heart disease of native coronary artery with unstable angina pectoris: Secondary | ICD-10-CM

## 2019-07-25 DIAGNOSIS — F5101 Primary insomnia: Secondary | ICD-10-CM

## 2019-07-25 DIAGNOSIS — Z1283 Encounter for screening for malignant neoplasm of skin: Secondary | ICD-10-CM

## 2019-07-25 DIAGNOSIS — F32A Depression, unspecified: Secondary | ICD-10-CM

## 2019-07-25 DIAGNOSIS — E538 Deficiency of other specified B group vitamins: Secondary | ICD-10-CM

## 2019-07-25 DIAGNOSIS — Z23 Encounter for immunization: Secondary | ICD-10-CM | POA: Diagnosis not present

## 2019-07-25 DIAGNOSIS — E559 Vitamin D deficiency, unspecified: Secondary | ICD-10-CM

## 2019-07-25 DIAGNOSIS — I5032 Chronic diastolic (congestive) heart failure: Secondary | ICD-10-CM

## 2019-07-25 DIAGNOSIS — I10 Essential (primary) hypertension: Secondary | ICD-10-CM

## 2019-07-25 DIAGNOSIS — R739 Hyperglycemia, unspecified: Secondary | ICD-10-CM

## 2019-07-25 DIAGNOSIS — M85852 Other specified disorders of bone density and structure, left thigh: Secondary | ICD-10-CM

## 2019-07-25 DIAGNOSIS — F329 Major depressive disorder, single episode, unspecified: Secondary | ICD-10-CM

## 2019-07-25 LAB — CBC
HCT: 42.4 % (ref 36.0–46.0)
Hemoglobin: 14.2 g/dL (ref 12.0–15.0)
MCHC: 33.5 g/dL (ref 30.0–36.0)
MCV: 89.6 fl (ref 78.0–100.0)
Platelets: 361 10*3/uL (ref 150.0–400.0)
RBC: 4.73 Mil/uL (ref 3.87–5.11)
RDW: 13.2 % (ref 11.5–15.5)
WBC: 12.2 10*3/uL — ABNORMAL HIGH (ref 4.0–10.5)

## 2019-07-25 LAB — COMPREHENSIVE METABOLIC PANEL
ALT: 21 U/L (ref 0–35)
AST: 19 U/L (ref 0–37)
Albumin: 4.7 g/dL (ref 3.5–5.2)
Alkaline Phosphatase: 59 U/L (ref 39–117)
BUN: 25 mg/dL — ABNORMAL HIGH (ref 6–23)
CO2: 30 mEq/L (ref 19–32)
Calcium: 10.3 mg/dL (ref 8.4–10.5)
Chloride: 98 mEq/L (ref 96–112)
Creatinine, Ser: 0.86 mg/dL (ref 0.40–1.20)
GFR: 64.67 mL/min (ref >60.00)
Glucose, Bld: 101 mg/dL — ABNORMAL HIGH (ref 70–99)
Potassium: 4.4 mEq/L (ref 3.5–5.1)
Sodium: 139 mEq/L (ref 135–145)
Total Bilirubin: 0.7 mg/dL (ref 0.2–1.2)
Total Protein: 7.6 g/dL (ref 6.0–8.3)

## 2019-07-25 LAB — VITAMIN D 25 HYDROXY (VIT D DEFICIENCY, FRACTURES): VITD: 62.76 ng/mL (ref 30.00–100.00)

## 2019-07-25 LAB — VITAMIN B12: Vitamin B-12: 742 pg/mL (ref 211–911)

## 2019-07-25 LAB — HEMOGLOBIN A1C: Hgb A1c MFr Bld: 6.1 % (ref 4.6–6.5)

## 2019-07-25 NOTE — Patient Instructions (Addendum)
Felicia Perry , Thank you for taking time to come for your Medicare Wellness Visit. I appreciate your ongoing commitment to your health goals. Please review the following plan we discussed and let me know if I can assist you in the future.   Screening recommendations/referrals: Colorectal Screening: up to date; Cologuard 05/20/18- repeat 2022 Mammogram: scheduled for 08/08/19 Bone Density: please schedule as advised by Dr. Yong Channel    Vision and Dental Exams: Recommended annual ophthalmology exams for early detection of glaucoma and other disorders of the eye Recommended annual dental exams for proper oral hygiene  Vaccinations: Influenza vaccine: today Pneumococcal vaccine: up to date; last 02/08/15 Tdap vaccine: recommended- Please call your insurance company to determine your out of pocket expense. You may also receive this vaccine at your local pharmacy or Health Dept. Shingles vaccine: Please call your insurance company to determine your out of pocket expense for the Shingrix vaccine. You may receive this vaccine at your local pharmacy.  Advanced directives: Advance directives discussed with you today. I have provided a copy for you to complete at home and have notarized. Once this is complete please bring a copy in to our office so we can scan it into your chart.  Goals: Recommend to begin DASH diet as directed and monitor weight daily for fluid retention   Next appointment: Please schedule your Annual Wellness Visit with your Nurse Health Advisor in one year.  Preventive Care 73 Years and Older, Female Preventive care refers to lifestyle choices and visits with your health care provider that can promote health and wellness. What does preventive care include?  A yearly physical exam. This is also called an annual well check.  Dental exams once or twice a year.  Routine eye exams. Ask your health care provider how often you should have your eyes checked.  Personal lifestyle choices,  including:  Daily care of your teeth and gums.  Regular physical activity.  Eating a healthy diet.  Avoiding tobacco and drug use.  Limiting alcohol use.  Practicing safe sex.  Taking low-dose aspirin every day if recommended by your health care provider.  Taking vitamin and mineral supplements as recommended by your health care provider. What happens during an annual well check? The services and screenings done by your health care provider during your annual well check will depend on your age, overall health, lifestyle risk factors, and family history of disease. Counseling  Your health care provider may ask you questions about your:  Alcohol use.  Tobacco use.  Drug use.  Emotional well-being.  Home and relationship well-being.  Sexual activity.  Eating habits.  History of falls.  Memory and ability to understand (cognition).  Work and work Statistician.  Reproductive health. Screening  You may have the following tests or measurements:  Height, weight, and BMI.  Blood pressure.  Lipid and cholesterol levels. These may be checked every 5 years, or more frequently if you are over 86 years old.  Skin check.  Lung cancer screening. You may have this screening every year starting at age 65 if you have a 30-pack-year history of smoking and currently smoke or have quit within the past 15 years.  Fecal occult blood test (FOBT) of the stool. You may have this test every year starting at age 74.  Flexible sigmoidoscopy or colonoscopy. You may have a sigmoidoscopy every 5 years or a colonoscopy every 10 years starting at age 24.  Hepatitis C blood test.  Hepatitis B blood test.  Sexually transmitted  disease (STD) testing.  Diabetes screening. This is done by checking your blood sugar (glucose) after you have not eaten for a while (fasting). You may have this done every 1-3 years.  Bone density scan. This is done to screen for osteoporosis. You may have this  done starting at age 67.  Mammogram. This may be done every 1-2 years. Talk to your health care provider about how often you should have regular mammograms. Talk with your health care provider about your test results, treatment options, and if necessary, the need for more tests. Vaccines  Your health care provider may recommend certain vaccines, such as:  Influenza vaccine. This is recommended every year.  Tetanus, diphtheria, and acellular pertussis (Tdap, Td) vaccine. You may need a Td booster every 10 years.  Zoster vaccine. You may need this after age 71.  Pneumococcal 13-valent conjugate (PCV13) vaccine. One dose is recommended after age 28.  Pneumococcal polysaccharide (PPSV23) vaccine. One dose is recommended after age 110. Talk to your health care provider about which screenings and vaccines you need and how often you need them. This information is not intended to replace advice given to you by your health care provider. Make sure you discuss any questions you have with your health care provider. Document Released: 11/08/2015 Document Revised: 07/01/2016 Document Reviewed: 08/13/2015 Elsevier Interactive Patient Education  2017 [place] Prevention in the Home Falls can cause injuries. They can happen to people of all ages. There are many things you can do to make your home safe and to help prevent falls. What can I do on the outside of my home?  Regularly fix the edges of walkways and driveways and fix any cracks.  Remove anything that might make you trip as you walk through a door, such as a raised step or threshold.  Trim any bushes or trees on the path to your home.  Use bright outdoor lighting.  Clear any walking paths of anything that might make someone trip, such as rocks or tools.  Regularly check to see if handrails are loose or broken. Make sure that both sides of any steps have handrails.  Any raised decks and porches should have guardrails on the  edges.  Have any leaves, snow, or ice cleared regularly.  Use sand or salt on walking paths during winter.  Clean up any spills in your garage right away. This includes oil or grease spills. What can I do in the bathroom?  Use night lights.  Install grab bars by the toilet and in the tub and shower. Do not use towel bars as grab bars.  Use non-skid mats or decals in the tub or shower.  If you need to sit down in the shower, use a plastic, non-slip stool.  Keep the floor dry. Clean up any water that spills on the floor as soon as it happens.  Remove soap buildup in the tub or shower regularly.  Attach bath mats securely with double-sided non-slip rug tape.  Do not have throw rugs and other things on the floor that can make you trip. What can I do in the bedroom?  Use night lights.  Make sure that you have a light by your bed that is easy to reach.  Do not use any sheets or blankets that are too big for your bed. They should not hang down onto the floor.  Have a firm chair that has side arms. You can use this for support while you get dressed.  Do not have throw rugs and other things on the floor that can make you trip. What can I do in the kitchen?  Clean up any spills right away.  Avoid walking on wet floors.  Keep items that you use a lot in easy-to-reach places.  If you need to reach something above you, use a strong step stool that has a grab bar.  Keep electrical cords out of the way.  Do not use floor polish or wax that makes floors slippery. If you must use wax, use non-skid floor wax.  Do not have throw rugs and other things on the floor that can make you trip. What can I do with my stairs?  Do not leave any items on the stairs.  Make sure that there are handrails on both sides of the stairs and use them. Fix handrails that are broken or loose. Make sure that handrails are as long as the stairways.  Check any carpeting to make sure that it is firmly  attached to the stairs. Fix any carpet that is loose or worn.  Avoid having throw rugs at the top or bottom of the stairs. If you do have throw rugs, attach them to the floor with carpet tape.  Make sure that you have a light switch at the top of the stairs and the bottom of the stairs. If you do not have them, ask someone to add them for you. What else can I do to help prevent falls?  Wear shoes that:  Do not have high heels.  Have rubber bottoms.  Are comfortable and fit you well.  Are closed at the toe. Do not wear sandals.  If you use a stepladder:  Make sure that it is fully opened. Do not climb a closed stepladder.  Make sure that both sides of the stepladder are locked into place.  Ask someone to hold it for you, if possible.  Clearly mark and make sure that you can see:  Any grab bars or handrails.  First and last steps.  Where the edge of each step is.  Use tools that help you move around (mobility aids) if they are needed. These include:  Canes.  Walkers.  Scooters.  Crutches.  Turn on the lights when you go into a dark area. Replace any light bulbs as soon as they burn out.  Set up your furniture so you have a clear path. Avoid moving your furniture around.  If any of your floors are uneven, fix them.  If there are any pets around you, be aware of where they are.  Review your medicines with your doctor. Some medicines can make you feel dizzy. This can increase your chance of falling. Ask your doctor what other things that you can do to help prevent falls. This information is not intended to replace advice given to you by your health care provider. Make sure you discuss any questions you have with your health care provider. Document Released: 08/08/2009 Document Revised: 03/19/2016 Document Reviewed: 11/16/2014 Elsevier Interactive Patient Education  2017 [employer].                                                                             -

## 2019-07-25 NOTE — Progress Notes (Signed)
I have reviewed and agree with note, evaluation, plan.  See my separate note from today  Stephen Hunter, MD  

## 2019-07-25 NOTE — Progress Notes (Deleted)
[name] [address] [place], [place] [address] Phone: [phone] Subjective:    I'm seeing this patient by the request  of:    CC: Back and neck pain follow-up  [id number]   06/27/2019 Discussed posture and ergonomics, discussed which activities to do which wants to avoid.  Increase activity as tolerated.  Follow-up again in 4 to 8 weeks  Update 07/25/2019 Felicia Perry is a 73 y.o. female coming in with complaint of ***  Onset-  Location Duration-  Character- Aggravating factors- Reliving factors-  Therapies tried-  Severity-     Past Medical History:  Diagnosis Date  . Adenomatous polyp 11/23/2005  . Anxiety   . Chronic diastolic CHF (congestive heart failure) ([place])   . Coronary artery disease    a. HC on 08/14/16 showed RCA CTO s/p PCI, 60% LCX disease managed medically with recs to consider staged PCI if continued symptoms.   Marland Kitchen DIVERTICULOSIS, COLON 04/22/2007       . GERD (gastroesophageal reflux disease)   . History of shingles   . Hypertension   . Hypertensive heart disease   . Internal hemorrhoids   . Osteoarthritis    "knees, hands, lower back" (08/14/2016)  . PUD (peptic ulcer disease)   . Sleep apnea    a. resolved with weight loss   Past Surgical History:  Procedure Laterality Date  . BREAST EXCISIONAL BIOPSY    . BREAST SURGERY Left 1980s   Fibrous Tumors removed   . CARDIAC CATHETERIZATION N/A 08/14/2016   Procedure: Left Heart Cath and Coronary Angiography;  Surgeon: Sherren Mocha, MD;  Location: [facility];  Service: Cardiovascular;  Laterality: N/A;  . CARDIAC CATHETERIZATION N/A 08/14/2016   Procedure: Coronary Stent Intervention;  Surgeon: Sherren Mocha, MD;  Location: [facility];  Service: Cardiovascular;  Laterality: N/A;  . CATARACT EXTRACTION W/ INTRAOCULAR LENS  IMPLANT, BILATERAL Bilateral 05/2009  . CORONARY ANGIOPLASTY    . detached retina left eye     may 2019  . TUBAL  LIGATION  1970s   Social History   Socioeconomic History  . Marital status: Divorced    Spouse name: Not on file  . Number of children: Not on file  . Years of education: Not on file  . Highest education level: Not on file  Occupational History  . Occupation: Education administrator  Social Needs  . Financial resource strain: Not on file  . Food insecurity    Worry: Not on file    Inability: Not on file  . Transportation needs    Medical: Not on file    Non-medical: Not on file  Tobacco Use  . Smoking status: Former Smoker    Packs/day: 1.00    Years: 26.00    Pack years: 26.00    Types: Cigarettes    Quit date: 1989    Years since quitting: 31.7  . Smokeless tobacco: Never Used  Substance and Sexual Activity  . Alcohol use: No  . Drug use: No  . Sexual activity: Not Currently  Lifestyle  . Physical activity    Days per week: Not on file    Minutes per session: Not on file  . Stress: Not on file  Relationships  . Social Herbalist on phone: Not on file    Gets together: Not on file    Attends religious service: Not on file    Active member of club or organization: Not on file  Attends meetings of clubs or organizations: Not on file    Relationship status: Not on file  Other Topics Concern  . Not on file  Social History Narrative   Divorced for 7 years in 2016. 2 sons- 1 son had been in prison now living with her in 2019.  2 granddaughters with 1 living close.        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       Hobbies: watch tv-survivor, dancing with the stars, enjoy alone time. Best friend [name] patient of Dr. Yong Channel. Enjoys work, time on Teaching laboratory technician.    Allergies  Allergen Reactions  . Celexa [Citalopram Hydrobromide]     psychosis  . Cephalosporins     REACTION: tongue swelling  . Clarithromycin     REACTION: blisters in mouth  . Doxycycline Hyclate     REACTION: nausea, vomiting  . Levofloxacin      REACTION: tongue swells  . Penicillins     REACTION: per patient causes rash,hives  . Rosuvastatin Other (See Comments)    Pt reports causes bilateral lower extremity muscle aches.   . Zolpidem Tartrate     REACTION: difficulty with concentration   Family History  Problem Relation Age of Onset  . Heart disease Mother   . Hypertension Mother   . Diabetes Mother   . COPD Father   . Colon cancer Neg Hx      Current Outpatient Medications (Cardiovascular):  .  amLODipine (NORVASC) 10 MG tablet, Take 1 tablet (10 mg total) by mouth daily. .  Bempedoic Acid (NEXLETOL) 180 MG TABS, Take 1 tablet by mouth daily. Marland Kitchen  ezetimibe (ZETIA) 10 MG tablet, Take 1 tablet (10 mg total) by mouth daily. .  irbesartan (AVAPRO) 300 MG tablet, Take 1 tablet (300 mg total) by mouth daily. .  metoprolol succinate (TOPROL-XL) 50 MG 24 hr tablet, Take 1 tablet (50 mg total) by mouth daily. Take with or immediately following a meal. .  nitroGLYCERIN (NITROSTAT) 0.4 MG SL tablet, Place 1 tablet (0.4 mg total) under the tongue every 5 (five) minutes as needed for chest pain. Marland Kitchen  omega-3 acid ethyl esters (LOVAZA) 1 g capsule, Take 2 capsules (2 g total) by mouth daily.  Current Outpatient Medications (Respiratory):  .  albuterol (PROVENTIL HFA;VENTOLIN HFA) 108 (90 Base) MCG/ACT inhaler, Inhale 2 puffs into the lungs every 6 (six) hours as needed for wheezing or shortness of breath. .  budesonide-formoterol (SYMBICORT) 160-4.5 MCG/ACT inhaler, Inhale 2 puffs into the lungs as needed. Marland Kitchen  levocetirizine (XYZAL) 5 MG tablet, TAKE ONE TABLET EVERY MORNING  Current Outpatient Medications (Analgesics):  .  acetaminophen (TYLENOL) 325 MG tablet, Take 650 mg by mouth every 6 (six) hours as needed. Marland Kitchen  aspirin EC 81 MG tablet, Take 1 tablet (81 mg total) by mouth daily.   Current Outpatient Medications (Other):  Marland Kitchen  ALPRAZolam (XANAX) 1 MG tablet, TAKE ONE TABLET AT BEDTIME AS NEEDED FORSLEEP .  Magnesium 200 MG TABS,  Take 1 tablet (200 mg total) by mouth daily. .  pantoprazole (PROTONIX) 20 MG tablet, Take 1 tablet (20 mg total) by mouth daily. Marland Kitchen  venlafaxine XR (EFFEXOR-XR) 75 MG 24 hr capsule, TAKE ONE CAPSULE EACH DAY WITH BREAKFAST .  Vitamin D, Ergocalciferol, (DRISDOL) 50000 units CAPS capsule, Take 2,000 Units by mouth daily.     Past medical history, social, surgical and family history all reviewed in electronic medical record.  No  pertanent information unless stated regarding to the chief complaint.   Review of Systems:  No headache, visual changes, nausea, vomiting, diarrhea, constipation, dizziness, abdominal pain, skin rash, fevers, chills, night sweats, weight loss, swollen lymph nodes, body aches, joint swelling, muscle aches, chest pain, shortness of breath, mood changes.   Objective  There were no vitals taken for this visit. Systems examined below as of    General: No apparent distress alert and oriented x3 mood and affect normal, dressed appropriately.  HEENT: Pupils equal, extraocular movements intact  Respiratory: Patient's speak in full sentences and does not appear short of breath  Cardiovascular: No lower extremity edema, non tender, no erythema  Skin: Warm dry intact with no signs of infection or rash on extremities or on axial skeleton.  Abdomen: Soft nontender  Neuro: Cranial nerves II through XII are intact, neurovascularly intact in all extremities with 2+ DTRs and 2+ pulses.  Lymph: No lymphadenopathy of posterior or anterior cervical chain or axillae bilaterally.  Gait normal with good balance and coordination.  MSK:  Non tender with full range of motion and good stability and symmetric strength and tone of shoulders, elbows, wrist, hip, knee and ankles bilaterally.  Neck: Inspection unremarkable. No palpable stepoffs. Negative Spurling's maneuver. Full neck range of motion Grip strength and sensation normal in bilateral hands Strength good C4 to T1 distribution No  sensory change to C4 to T1 Negative Hoffman sign bilaterally Reflexes normal  Back Exam:  Inspection: Unremarkable  Motion: Flexion 45 deg, Extension 45 deg, Side Bending to 45 deg bilaterally,  Rotation to 45 deg bilaterally  SLR laying: Negative  XSLR laying: Negative  Palpable tenderness: None. FABER: negative. Sensory change: Gross sensation intact to all lumbar and sacral dermatomes.  Reflexes: 2+ at both patellar tendons, 2+ at achilles tendons, Babinski's downgoing.  Strength at foot  Plantar-flexion: 5/5 Dorsi-flexion: 5/5 Eversion: 5/5 Inversion: 5/5  Leg strength  Quad: 5/5 Hamstring: 5/5 Hip flexor: 5/5 Hip abductors: 5/5  Gait unremarkable.  Osteopathic findings C2 flexed rotated and side bent right C6 flexed rotated and side bent left T3 extended rotated and side bent right inhaled third rib T9 extended rotated and side bent left L2 flexed rotated and side bent right Sacrum right on right    Impression and Recommendations:     This case required medical decision making of moderate complexity. The above documentation has been reviewed and is accurate and complete [name], DO       Note: This dictation was prepared with Dragon dictation along with smaller phrase technology. Any transcriptional errors that result from this process are unintentional.

## 2019-07-25 NOTE — Progress Notes (Signed)
Subjective:   Felicia Perry is a 73 y.o. female who presents for Medicare Annual (Subsequent) preventive examination.  Review of Systems:   Cardiac Risk Factors include: advanced age (>14men, >79 women);dyslipidemia;hypertension     Objective:     Vitals: BP 140/84 Comment: in office without medication today  Temp 97.9 F (36.6 C) (Temporal)   Ht 5' (1.524 m)   Wt 160 lb 15 oz (73 kg)   BMI 31.43 kg/m   Body mass index is 31.43 kg/m.  Advanced Directives 07/25/2019 04/21/2018 11/17/2016 08/14/2016 08/14/2016 03/07/2015  Does Patient Have a Medical Advance Directive? No Yes No Yes Yes No  Type of Advance Directive - - - - Living will;Healthcare Power of Attorney -  Does patient want to make changes to medical advance directive? Yes (MAU/Ambulatory/Procedural Areas - Information given) - - No - Patient declined - -  Copy of [place] in Chart? - - - No - copy requested No - copy requested -  Would patient like information on creating a medical advance directive? - - - - - Yes - Scientist, clinical (histocompatibility and immunogenetics) given    Tobacco Social History   Tobacco Use  Smoking Status Former Smoker  . Packs/day: 1.00  . Years: 26.00  . Pack years: 26.00  . Types: Cigarettes  . Quit date: 69  . Years since quitting: 31.7  Smokeless Tobacco Never Used     Counseling given: Not Answered   Clinical Intake:  Pre-visit preparation completed: Yes  Pain : No/denies pain  Diabetes: No  How often do you need to have someone help you when you read instructions, pamphlets, or other written materials from your doctor or pharmacy?: 1 - Never  Interpreter Needed?: No  Information entered by :: Denman George LPN  Past Medical History:  Diagnosis Date  . Adenomatous polyp 11/23/2005  . Anxiety   . Chronic diastolic CHF (congestive heart failure) ([place])   . Coronary artery disease    a. HC on 08/14/16 showed RCA CTO s/p PCI, 60% LCX disease managed medically with recs to  consider staged PCI if continued symptoms.   Marland Kitchen DIVERTICULOSIS, COLON 04/22/2007       . GERD (gastroesophageal reflux disease)   . History of shingles   . Hypertension   . Hypertensive heart disease   . Internal hemorrhoids   . Osteoarthritis    "knees, hands, lower back" (08/14/2016)  . PUD (peptic ulcer disease)   . Sleep apnea    a. resolved with weight loss   Past Surgical History:  Procedure Laterality Date  . BREAST EXCISIONAL BIOPSY    . BREAST SURGERY Left 1980s   Fibrous Tumors removed   . CARDIAC CATHETERIZATION N/A 08/14/2016   Procedure: Left Heart Cath and Coronary Angiography;  Surgeon: Sherren Mocha, MD;  Location: [facility];  Service: Cardiovascular;  Laterality: N/A;  . CARDIAC CATHETERIZATION N/A 08/14/2016   Procedure: Coronary Stent Intervention;  Surgeon: Sherren Mocha, MD;  Location: [facility];  Service: Cardiovascular;  Laterality: N/A;  . CATARACT EXTRACTION W/ INTRAOCULAR LENS  IMPLANT, BILATERAL Bilateral 05/2009  . CORONARY ANGIOPLASTY    . detached retina left eye     may 2019  . TUBAL LIGATION  1970s   Family History  Problem Relation Age of Onset  . Heart disease Mother   . Hypertension Mother   . Diabetes Mother   . Dementia Mother   . COPD Father   . Dementia Maternal Grandmother   .  Colon cancer Neg Hx    Social History   Socioeconomic History  . Marital status: Divorced    Spouse name: Not on file  . Number of children: Not on file  . Years of education: Not on file  . Highest education level: Not on file  Occupational History  . Occupation: Education administrator  Social Needs  . Financial resource strain: Not on file  . Food insecurity    Worry: Not on file    Inability: Not on file  . Transportation needs    Medical: Not on file    Non-medical: Not on file  Tobacco Use  . Smoking status: Former Smoker    Packs/day: 1.00    Years: 26.00    Pack years: 26.00    Types: Cigarettes    Quit date: 1989    Years since  quitting: 31.7  . Smokeless tobacco: Never Used  Substance and Sexual Activity  . Alcohol use: No  . Drug use: No  . Sexual activity: Not Currently  Lifestyle  . Physical activity    Days per week: Not on file    Minutes per session: Not on file  . Stress: Not on file  Relationships  . Social Herbalist on phone: Not on file    Gets together: Not on file    Attends religious service: Not on file    Active member of club or organization: Not on file    Attends meetings of clubs or organizations: Not on file    Relationship status: Not on file  Other Topics Concern  . Not on file  Social History Narrative   Divorced for 69 years in 2016. 2 sons- 1 son had been in prison now living with her in 2019.  2 granddaughters with 1 living close.        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       Hobbies: watch tv-survivor, dancing with the stars, enjoy alone time. Best friend [name] patient of Dr. Yong Channel. Enjoys work, time on Teaching laboratory technician.     Outpatient Encounter Medications as of 07/25/2019  Medication Sig  . acetaminophen (TYLENOL) 325 MG tablet Take 650 mg by mouth every 6 (six) hours as needed.  Marland Kitchen albuterol (PROVENTIL HFA;VENTOLIN HFA) 108 (90 Base) MCG/ACT inhaler Inhale 2 puffs into the lungs every 6 (six) hours as needed for wheezing or shortness of breath.  . ALPRAZolam (XANAX) 1 MG tablet TAKE ONE TABLET AT BEDTIME AS NEEDED FORSLEEP  . amLODipine (NORVASC) 10 MG tablet Take 1 tablet (10 mg total) by mouth daily.  Marland Kitchen aspirin EC 81 MG tablet Take 1 tablet (81 mg total) by mouth daily.  . Bempedoic Acid (NEXLETOL) 180 MG TABS Take 1 tablet by mouth daily.  . budesonide-formoterol (SYMBICORT) 160-4.5 MCG/ACT inhaler Inhale 2 puffs into the lungs as needed.  . ezetimibe (ZETIA) 10 MG tablet Take 1 tablet (10 mg total) by mouth daily.  . irbesartan (AVAPRO) 300 MG tablet Take 1 tablet (300 mg total) by mouth daily.  Marland Kitchen  levocetirizine (XYZAL) 5 MG tablet TAKE ONE TABLET EVERY MORNING  . Magnesium 200 MG TABS Take 1 tablet (200 mg total) by mouth daily.  . metoprolol succinate (TOPROL-XL) 50 MG 24 hr tablet Take 1 tablet (50 mg total) by mouth daily. Take with or immediately following a meal.  . nitroGLYCERIN (NITROSTAT) 0.4 MG SL tablet Place 1 tablet (0.4 mg total) under the tongue  every 5 (five) minutes as needed for chest pain.  Marland Kitchen omega-3 acid ethyl esters (LOVAZA) 1 g capsule Take 2 capsules (2 g total) by mouth daily.  . pantoprazole (PROTONIX) 20 MG tablet Take 1 tablet (20 mg total) by mouth daily.  Marland Kitchen venlafaxine XR (EFFEXOR-XR) 75 MG 24 hr capsule TAKE ONE CAPSULE EACH DAY WITH BREAKFAST  . Vitamin D, Ergocalciferol, (DRISDOL) 50000 units CAPS capsule Take 2,000 Units by mouth daily.    No facility-administered encounter medications on file as of 07/25/2019.     Activities of Daily Living In your present state of health, do you have any difficulty performing the following activities: 07/25/2019  Hearing? N  Vision? N  Difficulty concentrating or making decisions? N  Walking or climbing stairs? N  Dressing or bathing? N  Doing errands, shopping? N  Preparing Food and eating ? N  Using the Toilet? N  In the past six months, have you accidently leaked urine? N  Do you have problems with loss of bowel control? N  Managing your Medications? N  Managing your Finances? N  Housekeeping or managing your Housekeeping? N  Some recent data might be hidden    Patient Care Team: Marin Olp, MD as PCP - General (Family Medicine) Dorothy Spark, MD as PCP - Cardiology (Cardiology) [name], DO as Consulting Physician (Sports Medicine)    Assessment:   This is a routine wellness examination for Isobelle.  Exercise Activities and Dietary recommendations Current Exercise Habits: The patient has a physically strenuous job, but has no regular exercise apart from work.  Goals    . Patient  Stated     Will work another year Take time off and plan to take a week off q 4 months        Fall Risk Fall Risk  07/25/2019 04/21/2018 04/21/2018 02/01/2018 08/04/2016  Falls in the past year? 0 No No Yes Yes  Number falls in past yr: 0 - - 1 2 or more  Injury with Fall? 0 - - No No  Follow up Education provided;Falls prevention discussed;Falls evaluation completed - - Falls evaluation completed -   Is the patient's home free of loose throw rugs in walkways, pet beds, electrical cords, etc?   yes      Grab bars in the bathroom? yes      Handrails on the stairs?   yes      Adequate lighting?   yes  Timed Get Up and Go performed: completed and within normal timeframe; no gait abnormalities noted    Depression Screen PHQ 2/9 Scores 07/25/2019 07/25/2019 07/25/2019 07/25/2019  PHQ - 2 Score 0 0 0 0  PHQ- 9 Score - 2 0 -     Cognitive Function-no cognitive concerns at this time  MMSE - Mini Mental State Exam 07/25/2019 04/21/2018 04/21/2018  Not completed: - (No Data) (No Data)  Orientation to time 5 - -  Orientation to Place 5 - -  Registration 3 - -  Attention/ Calculation 5 - -  Recall 3 - -  Language- name 2 objects 2 - -  Language- repeat 1 - -  Language- follow 3 step command 3 - -  Language- read & follow direction 1 - -  Write a sentence 1 - -  Copy design 1 - -  Total score 30 - -        Immunization History  Administered Date(s) Administered  . Fluad Quad(high Dose 65+) 07/25/2019  . Influenza  Split 09/21/2011, 07/07/2012  . Influenza Whole 08/10/2008, 07/30/2010  . Influenza, High Dose Seasonal PF 08/04/2016, 07/05/2018  . Influenza,inj,Quad PF,6+ Mos 07/21/2013, 08/06/2015  . Influenza-Unspecified 07/10/2014, 10/01/2017  . Pneumococcal Conjugate-13 01/05/2014  . Pneumococcal Polysaccharide-23 02/08/2015  . Td 10/26/2001  . Tetanus 01/05/2014    Qualifies for Shingles Vaccine?Discussed and patient will check with pharmacy for coverage.  Patient education  handout provided    Screening Tests Health Maintenance  Topic Date Due  . MAMMOGRAM  04/27/2020  . Fecal DNA (Cologuard)  05/20/2021  . TETANUS/TDAP  01/06/2024  . INFLUENZA VACCINE  Completed  . DEXA SCAN  Completed  . Hepatitis C Screening  Completed  . PNA vac Low Risk Adult  Completed    Cancer Screenings: Lung: Low Dose CT Chest recommended if Age 61-80 years, 30 pack-year currently smoking OR have quit w/in 15years. Patient does not qualify. Breast:  Up to date on Mammogram? Yes; scheduled  Up to date of Bone Density/Dexa? Yes; ordered and to be scheduled  Colorectal: Cologuard normal 05/20/18      Plan:  I have personally reviewed and addressed the Medicare Annual Wellness questionnaire and have noted the following in the patient's chart:  A. Medical and social history B. Use of alcohol, tobacco or illicit drugs  C. Current medications and supplements D. Functional ability and status E.  Nutritional status F.  Physical activity G. Advance directives H. List of other physicians I.  Hospitalizations, surgeries, and ER visits in previous 12 months J.  [place] such as hearing and vision if needed, cognitive and depression L. Referrals, records requested, and appointments- none   In addition, I have reviewed and discussed with patient certain preventive protocols, quality metrics, and best practice recommendations. A written personalized care plan for preventive services as well as general preventive health recommendations were provided to patient.   Signed,  Denman George, LPN  Nurse Health Advisor   Nurse Notes: no additional

## 2019-08-08 ENCOUNTER — Ambulatory Visit: Payer: Medicare Other

## 2019-08-23 ENCOUNTER — Ambulatory Visit: Payer: Medicare Other | Admitting: Family Medicine

## 2019-08-23 ENCOUNTER — Other Ambulatory Visit: Payer: Self-pay

## 2019-08-23 ENCOUNTER — Encounter: Payer: Self-pay | Admitting: Family Medicine

## 2019-08-23 VITALS — BP 142/82 | HR 80 | Ht 60.0 in | Wt 165.0 lb

## 2019-08-23 DIAGNOSIS — M533 Sacrococcygeal disorders, not elsewhere classified: Secondary | ICD-10-CM

## 2019-08-23 DIAGNOSIS — M999 Biomechanical lesion, unspecified: Secondary | ICD-10-CM

## 2019-08-23 NOTE — Patient Instructions (Signed)
You are in a much better place Take it easy for week then can add one more day of exercise See me in 6 weeks

## 2019-08-23 NOTE — Assessment & Plan Note (Signed)
Mild exacerbation.  Discussed with patient icing regimen and home exercise, discussed which activities to avoid.  Patient restarted on osteopathic manipulation.  I believe the patient will do relatively well overall.  Follow-up with me again in 4 to 8 weeks.

## 2019-08-23 NOTE — Assessment & Plan Note (Signed)
Decision today to treat with OMT was based on Physical Exam  After verbal consent patient was treated with HVLA, ME, FPR techniques in cervical, thoracic, rib lumbar and sacral areas  Patient tolerated the procedure well with improvement in symptoms  Patient given exercises, stretches and lifestyle modifications  See medications in patient instructions if given  Patient will follow up in 4-8 weeks 

## 2019-08-23 NOTE — Progress Notes (Signed)
[name] [address], [place] [address] Phone: [phone] Subjective:   [name], am serving as a scribe for Dr. Hulan Saas.    CC: Back pain  [id number]  Felicia Perry is a 73 y.o. female coming in with complaint of back pain. Last seen on 06/27/2019 for OMT. Patient states that she had the cocktail injection last visit. Leg pain decreased but lower back pain increased. Is trying to cut back on work with only 6 hours a day otherwise her pain increase. Is feeling better overall.      Past Medical History:  Diagnosis Date  . Adenomatous polyp 11/23/2005  . Anxiety   . Chronic diastolic CHF (congestive heart failure) ([facility])   . Coronary artery disease    a. HC on 08/14/16 showed RCA CTO s/p PCI, 60% LCX disease managed medically with recs to consider staged PCI if continued symptoms.   Marland Kitchen DIVERTICULOSIS, COLON 04/22/2007       . GERD (gastroesophageal reflux disease)   . History of shingles   . Hypertension   . Hypertensive heart disease   . Internal hemorrhoids   . Osteoarthritis    "knees, hands, lower back" (08/14/2016)  . PUD (peptic ulcer disease)   . Sleep apnea    a. resolved with weight loss   Past Surgical History:  Procedure Laterality Date  . BREAST EXCISIONAL BIOPSY    . BREAST SURGERY Left 1980s   Fibrous Tumors removed   . CARDIAC CATHETERIZATION N/A 08/14/2016   Procedure: Left Heart Cath and Coronary Angiography;  Surgeon: Sherren Mocha, MD;  Location: [facility];  Service: Cardiovascular;  Laterality: N/A;  . CARDIAC CATHETERIZATION N/A 08/14/2016   Procedure: Coronary Stent Intervention;  Surgeon: Sherren Mocha, MD;  Location: [facility];  Service: Cardiovascular;  Laterality: N/A;  . CATARACT EXTRACTION W/ INTRAOCULAR LENS  IMPLANT, BILATERAL Bilateral 05/2009  . CORONARY ANGIOPLASTY    . detached retina left eye     may 2019  . TUBAL LIGATION  1970s   Social History    Socioeconomic History  . Marital status: Divorced    Spouse name: Not on file  . Number of children: Not on file  . Years of education: Not on file  . Highest education level: Not on file  Occupational History  . Occupation: Education administrator  Social Needs  . Financial resource strain: Not on file  . Food insecurity    Worry: Not on file    Inability: Not on file  . Transportation needs    Medical: Not on file    Non-medical: Not on file  Tobacco Use  . Smoking status: Former Smoker    Packs/day: 1.00    Years: 26.00    Pack years: 26.00    Types: Cigarettes    Quit date: 1989    Years since quitting: 31.8  . Smokeless tobacco: Never Used  Substance and Sexual Activity  . Alcohol use: No  . Drug use: No  . Sexual activity: Not Currently  Lifestyle  . Physical activity    Days per week: Not on file    Minutes per session: Not on file  . Stress: Not on file  Relationships  . Social Herbalist on phone: Not on file    Gets together: Not on file    Attends religious service: Not on file    Active member of club or organization: Not on file  Attends meetings of clubs or organizations: Not on file    Relationship status: Not on file  Other Topics Concern  . Not on file  Social History Narrative   Divorced for 31 years in 2016. 2 sons- 1 son had been in prison now living with her in 2019.  2 granddaughters with 1 living close.        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       Hobbies: watch tv-survivor, dancing with the stars, enjoy alone time. Best friend [name] patient of Dr. Yong Channel. Enjoys work, time on Teaching laboratory technician.    Allergies  Allergen Reactions  . Celexa [Citalopram Hydrobromide]     psychosis  . Cephalosporins     REACTION: tongue swelling  . Clarithromycin     REACTION: blisters in mouth  . Doxycycline Hyclate     REACTION: nausea, vomiting  . Levofloxacin     REACTION: tongue swells  . Penicillins      REACTION: per patient causes rash,hives  . Rosuvastatin Other (See Comments)    Pt reports causes bilateral lower extremity muscle aches.   . Zolpidem Tartrate     REACTION: difficulty with concentration   Family History  Problem Relation Age of Onset  . Heart disease Mother   . Hypertension Mother   . Diabetes Mother   . Dementia Mother   . COPD Father   . Dementia Maternal Grandmother   . Colon cancer Neg Hx      Current Outpatient Medications (Cardiovascular):  .  amLODipine (NORVASC) 10 MG tablet, Take 1 tablet (10 mg total) by mouth daily. .  Bempedoic Acid (NEXLETOL) 180 MG TABS, Take 1 tablet by mouth daily. Marland Kitchen  ezetimibe (ZETIA) 10 MG tablet, Take 1 tablet (10 mg total) by mouth daily. .  irbesartan (AVAPRO) 300 MG tablet, Take 1 tablet (300 mg total) by mouth daily. .  metoprolol succinate (TOPROL-XL) 50 MG 24 hr tablet, Take 1 tablet (50 mg total) by mouth daily. Take with or immediately following a meal. .  nitroGLYCERIN (NITROSTAT) 0.4 MG SL tablet, Place 1 tablet (0.4 mg total) under the tongue every 5 (five) minutes as needed for chest pain. Marland Kitchen  omega-3 acid ethyl esters (LOVAZA) 1 g capsule, Take 2 capsules (2 g total) by mouth daily.  Current Outpatient Medications (Respiratory):  .  albuterol (PROVENTIL HFA;VENTOLIN HFA) 108 (90 Base) MCG/ACT inhaler, Inhale 2 puffs into the lungs every 6 (six) hours as needed for wheezing or shortness of breath. .  budesonide-formoterol (SYMBICORT) 160-4.5 MCG/ACT inhaler, Inhale 2 puffs into the lungs as needed. Marland Kitchen  levocetirizine (XYZAL) 5 MG tablet, TAKE ONE TABLET EVERY MORNING  Current Outpatient Medications (Analgesics):  .  acetaminophen (TYLENOL) 325 MG tablet, Take 650 mg by mouth every 6 (six) hours as needed. Marland Kitchen  aspirin EC 81 MG tablet, Take 1 tablet (81 mg total) by mouth daily.   Current Outpatient Medications (Other):  Marland Kitchen  ALPRAZolam (XANAX) 1 MG tablet, TAKE ONE TABLET AT BEDTIME AS NEEDED FORSLEEP .  Magnesium 200  MG TABS, Take 1 tablet (200 mg total) by mouth daily. .  pantoprazole (PROTONIX) 20 MG tablet, Take 1 tablet (20 mg total) by mouth daily. Marland Kitchen  venlafaxine XR (EFFEXOR-XR) 75 MG 24 hr capsule, TAKE ONE CAPSULE EACH DAY WITH BREAKFAST .  Vitamin D, Ergocalciferol, (DRISDOL) 50000 units CAPS capsule, Take 2,000 Units by mouth daily.     Past medical history, social, surgical  and family history all reviewed in electronic medical record.  No pertanent information unless stated regarding to the chief complaint.   Review of Systems:  No headache, visual changes, nausea, vomiting, diarrhea, constipation, dizziness, abdominal pain, skin rash, fevers, chills, night sweats, weight loss, swollen lymph nodes, body aches, joint swelling,  chest pain, shortness of breath, mood changes.  Mild positive muscle aches  Objective  Blood pressure (!) 142/82, pulse 80, height 5' (1.524 m), weight 165 lb (74.8 kg), SpO2 98 %.    General: No apparent distress alert and oriented x3 mood and affect normal, dressed appropriately.  HEENT: Pupils equal, extraocular movements intact  Respiratory: Patient's speak in full sentences and does not appear short of breath  Cardiovascular: No lower extremity edema, non tender, no erythema  Skin: Warm dry intact with no signs of infection or rash on extremities or on axial skeleton.  Abdomen: Soft nontender  Neuro: Cranial nerves II through XII are intact, neurovascularly intact in all extremities with 2+ DTRs and 2+ pulses.  Lymph: No lymphadenopathy of posterior or anterior cervical chain or axillae bilaterally.  Gait normal with good balance and coordination.  MSK:  tender with full range of motion and good stability and symmetric strength and tone of shoulders, elbows, wrist, hip, knee and ankles bilaterally.  Mild arthritic changes of multiple joints  Patient back exam shows some tightness in the paraspinal musculature of the lumbar spine.  Patient does have some tightness  of the lower extremities bilaterally  No radicular symptoms.  5-5 strength in lower extremities.  Tightness is noted in the paraspinal musculature lumbar spine  Osteopathic findings  C2 flexed rotated and side bent right C4 flexed rotated and side bent left T3 extended rotated and side bent right inhaled third rib T10 extended rotated and side bent left L2 flexed rotated and side bent right Sacrum right on right      Impression and Recommendations:     This case required medical decision making of moderate complexity. The above documentation has been reviewed and is accurate and complete [name], DO       Note: This dictation was prepared with Dragon dictation along with smaller phrase technology. Any transcriptional errors that result from this process are unintentional.

## 2019-09-19 ENCOUNTER — Other Ambulatory Visit: Payer: Self-pay

## 2019-09-19 ENCOUNTER — Ambulatory Visit
Admission: RE | Admit: 2019-09-19 | Discharge: 2019-09-19 | Disposition: A | Discharge status: Home / Self Care | Payer: Medicare Other | Source: Ambulatory Visit | Attending: Family Medicine | Admitting: Family Medicine

## 2019-09-19 DIAGNOSIS — Z1231 Encounter for screening mammogram for malignant neoplasm of breast: Secondary | ICD-10-CM

## 2019-10-03 ENCOUNTER — Other Ambulatory Visit: Payer: Self-pay | Admitting: Family Medicine

## 2019-10-04 ENCOUNTER — Encounter: Payer: Self-pay | Admitting: Family Medicine

## 2019-10-04 ENCOUNTER — Ambulatory Visit: Payer: Medicare Other | Admitting: Family Medicine

## 2019-10-04 ENCOUNTER — Other Ambulatory Visit: Payer: Self-pay

## 2019-10-04 VITALS — BP 150/84 | HR 82 | Ht 60.0 in | Wt 167.0 lb

## 2019-10-04 DIAGNOSIS — M999 Biomechanical lesion, unspecified: Secondary | ICD-10-CM | POA: Diagnosis not present

## 2019-10-04 DIAGNOSIS — L720 Epidermal cyst: Secondary | ICD-10-CM | POA: Diagnosis not present

## 2019-10-04 DIAGNOSIS — D692 Other nonthrombocytopenic purpura: Secondary | ICD-10-CM | POA: Diagnosis not present

## 2019-10-04 DIAGNOSIS — L821 Other seborrheic keratosis: Secondary | ICD-10-CM | POA: Diagnosis not present

## 2019-10-04 DIAGNOSIS — M5030 Other cervical disc degeneration, unspecified cervical region: Secondary | ICD-10-CM

## 2019-10-04 DIAGNOSIS — R208 Other disturbances of skin sensation: Secondary | ICD-10-CM | POA: Diagnosis not present

## 2019-10-04 DIAGNOSIS — L814 Other melanin hyperpigmentation: Secondary | ICD-10-CM | POA: Diagnosis not present

## 2019-10-04 NOTE — Progress Notes (Signed)
[facility] [place],  [address] Phone: [phone] Subjective:   I Felicia Perry am serving as a Education administrator for Dr. Hulan Saas.  This visit occurred during the SARS-CoV-2 public health emergency.  Safety protocols were in place, including screening questions prior to the visit, additional usage of staff PPE, and extensive cleaning of exam room while observing appropriate contact time as indicated for disinfecting solutions.   I'm seeing this patient by the request  of:    CC: Neck pain follow-up  [id number]  Felicia Perry is a 73 y.o. female coming in with complaint of back pain. Patient states her legs hurt every day. Works about 5 hours a day before she feels pain in her lower back and legs.  Patient states that it is more of a dull, throbbing aching pain.  Sometimes seems to wake her up at night intermittently.  Still not stopping her from any activities.  No changes in medication.     Past Medical History:  Diagnosis Date  . Adenomatous polyp 11/23/2005  . Anxiety   . Chronic diastolic CHF (congestive heart failure) ([place])   . Coronary artery disease    a. HC on 08/14/16 showed RCA CTO s/p PCI, 60% LCX disease managed medically with recs to consider staged PCI if continued symptoms.   Marland Kitchen DIVERTICULOSIS, COLON 04/22/2007       . GERD (gastroesophageal reflux disease)   . History of shingles   . Hypertension   . Hypertensive heart disease   . Internal hemorrhoids   . Osteoarthritis    "knees, hands, lower back" (08/14/2016)  . PUD (peptic ulcer disease)   . Sleep apnea    a. resolved with weight loss   Past Surgical History:  Procedure Laterality Date  . BREAST EXCISIONAL BIOPSY    . BREAST SURGERY Left 1980s   Fibrous Tumors removed   . CARDIAC CATHETERIZATION N/A 08/14/2016   Procedure: Left Heart Cath and Coronary Angiography;  Surgeon: Sherren Mocha, MD;  Location: [facility];  Service: Cardiovascular;   Laterality: N/A;  . CARDIAC CATHETERIZATION N/A 08/14/2016   Procedure: Coronary Stent Intervention;  Surgeon: Sherren Mocha, MD;  Location: [facility];  Service: Cardiovascular;  Laterality: N/A;  . CATARACT EXTRACTION W/ INTRAOCULAR LENS  IMPLANT, BILATERAL Bilateral 05/2009  . CORONARY ANGIOPLASTY    . detached retina left eye     may 2019  . TUBAL LIGATION  1970s   Social History   Socioeconomic History  . Marital status: Divorced    Spouse name: Not on file  . Number of children: Not on file  . Years of education: Not on file  . Highest education level: Not on file  Occupational History  . Occupation: Education administrator  Social Needs  . Financial resource strain: Not on file  . Food insecurity    Worry: Not on file    Inability: Not on file  . Transportation needs    Medical: Not on file    Non-medical: Not on file  Tobacco Use  . Smoking status: Former Smoker    Packs/day: 1.00    Years: 26.00    Pack years: 26.00    Types: Cigarettes    Quit date: 1989    Years since quitting: 31.9  . Smokeless tobacco: Never Used  Substance and Sexual Activity  . Alcohol use: No  . Drug use: No  . Sexual activity: Not Currently  Lifestyle  . Physical activity  Days per week: Not on file    Minutes per session: Not on file  . Stress: Not on file  Relationships  . Social Herbalist on phone: Not on file    Gets together: Not on file    Attends religious service: Not on file    Active member of club or organization: Not on file    Attends meetings of clubs or organizations: Not on file    Relationship status: Not on file  Other Topics Concern  . Not on file  Social History Narrative   Divorced for 31 years in 2016. 2 sons- 1 son had been in prison now living with her in 2019.  2 granddaughters with 1 living close.        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       Hobbies: watch tv-survivor, dancing with the stars,  enjoy alone time. Best friend [name] patient of Dr. Yong Channel. Enjoys work, time on Teaching laboratory technician.    Allergies  Allergen Reactions  . Celexa [Citalopram Hydrobromide]     psychosis  . Cephalosporins     REACTION: tongue swelling  . Clarithromycin     REACTION: blisters in mouth  . Doxycycline Hyclate     REACTION: nausea, vomiting  . Levofloxacin     REACTION: tongue swells  . Penicillins     REACTION: per patient causes rash,hives  . Rosuvastatin Other (See Comments)    Pt reports causes bilateral lower extremity muscle aches.   . Zolpidem Tartrate     REACTION: difficulty with concentration   Family History  Problem Relation Age of Onset  . Heart disease Mother   . Hypertension Mother   . Diabetes Mother   . Dementia Mother   . COPD Father   . Dementia Maternal Grandmother   . Colon cancer Neg Hx      Current Outpatient Medications (Cardiovascular):  .  amLODipine (NORVASC) 10 MG tablet, Take 1 tablet (10 mg total) by mouth daily. .  Bempedoic Acid (NEXLETOL) 180 MG TABS, Take 1 tablet by mouth daily. Marland Kitchen  ezetimibe (ZETIA) 10 MG tablet, Take 1 tablet (10 mg total) by mouth daily. .  irbesartan (AVAPRO) 300 MG tablet, Take 1 tablet (300 mg total) by mouth daily. .  metoprolol succinate (TOPROL-XL) 50 MG 24 hr tablet, Take 1 tablet (50 mg total) by mouth daily. Take with or immediately following a meal. .  omega-3 acid ethyl esters (LOVAZA) 1 g capsule, Take 2 capsules (2 g total) by mouth daily. .  nitroGLYCERIN (NITROSTAT) 0.4 MG SL tablet, Place 1 tablet (0.4 mg total) under the tongue every 5 (five) minutes as needed for chest pain.  Current Outpatient Medications (Respiratory):  .  albuterol (PROVENTIL HFA;VENTOLIN HFA) 108 (90 Base) MCG/ACT inhaler, Inhale 2 puffs into the lungs every 6 (six) hours as needed for wheezing or shortness of breath. .  budesonide-formoterol (SYMBICORT) 160-4.5 MCG/ACT inhaler, Inhale 2 puffs into the lungs as needed. Marland Kitchen  levocetirizine  (XYZAL) 5 MG tablet, TAKE ONE TABLET EVERY MORNING  Current Outpatient Medications (Analgesics):  .  acetaminophen (TYLENOL) 325 MG tablet, Take 650 mg by mouth every 6 (six) hours as needed. Marland Kitchen  aspirin EC 81 MG tablet, Take 1 tablet (81 mg total) by mouth daily.   Current Outpatient Medications (Other):  Marland Kitchen  ALPRAZolam (XANAX) 1 MG tablet, TAKE ONE TABLET AT BEDTIME AS NEEDED FORSLEEP .  Magnesium 200 MG  TABS, Take 1 tablet (200 mg total) by mouth daily. .  pantoprazole (PROTONIX) 20 MG tablet, Take 1 tablet (20 mg total) by mouth daily. Marland Kitchen  venlafaxine XR (EFFEXOR-XR) 75 MG 24 hr capsule, TAKE ONE CAPSULE EACH DAY WITH BREAKFAST .  Vitamin D, Ergocalciferol, (DRISDOL) 50000 units CAPS capsule, Take 2,000 Units by mouth daily.     Past medical history, social, surgical and family history all reviewed in electronic medical record.  No pertanent information unless stated regarding to the chief complaint.   Review of Systems:  No headache, visual changes, nausea, vomiting, diarrhea, constipation, dizziness, abdominal pain, skin rash, fevers, chills, night sweats, weight loss, swollen lymph nodes, body aches, joint swelling,  chest pain, shortness of breath, mood changes.  Positive muscle aches  Objective  Blood pressure (!) 150/84, pulse 82, height 5' (1.524 m), weight 167 lb (75.8 kg), SpO2 98 %.   General: No apparent distress alert and oriented x3 mood and affect normal, dressed appropriately.  HEENT: Pupils equal, extraocular movements intact  Respiratory: Patient's speak in full sentences and does not appear short of breath  Cardiovascular: No lower extremity edema, non tender, no erythema  Skin: Warm dry intact with no signs of infection or rash on extremities or on axial skeleton.  Abdomen: Soft nontender  Neuro: Cranial nerves II through XII are intact, neurovascularly intact in all extremities with 2+ DTRs and 2+ pulses.  Lymph: No lymphadenopathy of posterior or anterior cervical  chain or axillae bilaterally.  Gait normal with good balance and coordination.  MSK:  tender with limited range of motion and good stability and symmetric strength and tone of shoulders, elbows, wrist, hip, knee and ankles bilaterally.  Moderate arthritic changes of multiple joints  Neck exam does have some mild loss of lordosis.  Patient does have crepitus noted with range of motion of the neck.  Patient lacks at least 5 degrees in all planes.  Negative Spurling's.  Good grip strength though of the upper extremities.  Neurovascularly intact.  Osteopathic findings  C4 flexed rotated and side bent left C7 flexed rotated and side bent left T5 extended rotated and side bent right inhaled rib T8 extended rotated and side bent left L2 flexed rotated and side bent right Sacrum right on right     Impression and Recommendations:     This case required medical decision making of moderate complexity. The above documentation has been reviewed and is accurate and complete [name], DO       Note: This dictation was prepared with Dragon dictation along with smaller phrase technology. Any transcriptional errors that result from this process are unintentional.

## 2019-10-04 NOTE — Assessment & Plan Note (Signed)
Stable overall, seem to have more crepitus noted today.  Discussed with patient about posture and ergonomics, discussed other over-the-counter medications, no changes in medication.  Patient will follow up with me again in 4 to 8 weeks

## 2019-10-04 NOTE — Assessment & Plan Note (Signed)
Decision today to treat with OMT was based on Physical Exam  After verbal consent patient was treated with HVLA, ME, FPR techniques in cervical, thoracic, rib lumbar and sacral areas  Patient tolerated the procedure well with improvement in symptoms  Patient given exercises, stretches and lifestyle modifications  See medications in patient instructions if given  Patient will follow up in 4-8 weeks 

## 2019-10-04 NOTE — Patient Instructions (Signed)
  [address], 1st floor [place], [place] [address] Phone [phone]  [facility] "total support" See me again in 5-6 weeks

## 2019-10-14 ENCOUNTER — Other Ambulatory Visit: Payer: Self-pay | Admitting: Family Medicine

## 2019-10-18 ENCOUNTER — Other Ambulatory Visit: Payer: Self-pay

## 2019-10-18 NOTE — Telephone Encounter (Signed)
See note

## 2019-10-18 NOTE — Telephone Encounter (Signed)
Pharm is calling checking on the status of alprazolam

## 2019-10-27 DIAGNOSIS — C4491 Basal cell carcinoma of skin, unspecified: Secondary | ICD-10-CM

## 2019-10-27 HISTORY — DX: Basal cell carcinoma of skin, unspecified: C44.91

## 2019-11-08 ENCOUNTER — Ambulatory Visit: Payer: Medicare Other | Admitting: Family Medicine

## 2019-11-08 ENCOUNTER — Other Ambulatory Visit: Payer: Self-pay

## 2019-11-08 VITALS — Ht 60.0 in | Wt 169.0 lb

## 2019-11-08 DIAGNOSIS — M999 Biomechanical lesion, unspecified: Secondary | ICD-10-CM | POA: Diagnosis not present

## 2019-11-08 DIAGNOSIS — F5101 Primary insomnia: Secondary | ICD-10-CM

## 2019-11-08 DIAGNOSIS — M533 Sacrococcygeal disorders, not elsewhere classified: Secondary | ICD-10-CM | POA: Diagnosis not present

## 2019-11-08 NOTE — Progress Notes (Signed)
[facility] [place] Phone: [phone] Subjective:   Felicia Perry, am serving as a scribe for Dr. Hulan Saas. This visit occurred during the SARS-CoV-2 public health emergency.  Safety protocols were in place, including screening questions prior to the visit, additional usage of staff PPE, and extensive cleaning of exam room while observing appropriate contact time as indicated for disinfecting solutions.   CC: Back and neck pain  [id number]  Felicia Perry is a 74 y.o. female coming in with complaint of back pain. Last seen on 10/04/2019 for OMT. Patient states doing relatively well at the moment.  Continues to have difficulty with her weight.  Continues Effexor on a regular basis.  Staying active.  Hamstring seems to be improving.  Concerned somewhat because primary care provider did tell her Xanax may make her stop breathing at night so now she is up all night trying not to take the medication    Past Medical History:  Diagnosis Date  . Adenomatous polyp 11/23/2005  . Anxiety   . Chronic diastolic CHF (congestive heart failure) ([facility])   . Coronary artery disease    a. HC on 08/14/16 showed RCA CTO s/p PCI, 60% LCX disease managed medically with recs to consider staged PCI if continued symptoms.   Marland Kitchen DIVERTICULOSIS, COLON 04/22/2007       . GERD (gastroesophageal reflux disease)   . History of shingles   . Hypertension   . Hypertensive heart disease   . Internal hemorrhoids   . Osteoarthritis    "knees, hands, lower back" (08/14/2016)  . PUD (peptic ulcer disease)   . Sleep apnea    a. resolved with weight loss   Past Surgical History:  Procedure Laterality Date  . BREAST EXCISIONAL BIOPSY    . BREAST SURGERY Left 1980s   Fibrous Tumors removed   . CARDIAC CATHETERIZATION N/A 08/14/2016   Procedure: Left Heart Cath and Coronary Angiography;  Surgeon: Sherren Mocha, MD;  Location: [facility];   Service: Cardiovascular;  Laterality: N/A;  . CARDIAC CATHETERIZATION N/A 08/14/2016   Procedure: Coronary Stent Intervention;  Surgeon: Sherren Mocha, MD;  Location: [facility];  Service: Cardiovascular;  Laterality: N/A;  . CATARACT EXTRACTION W/ INTRAOCULAR LENS  IMPLANT, BILATERAL Bilateral 05/2009  . CORONARY ANGIOPLASTY    . detached retina left eye     may 2019  . TUBAL LIGATION  1970s   Social History   Socioeconomic History  . Marital status: Divorced    Spouse name: Not on file  . Number of children: Not on file  . Years of education: Not on file  . Highest education level: Not on file  Occupational History  . Occupation: Cleaning  Tobacco Use  . Smoking status: Former Smoker    Packs/day: 1.00    Years: 26.00    Pack years: 26.00    Types: Cigarettes    Quit date: 1989    Years since quitting: 32.0  . Smokeless tobacco: Never Used  Substance and Sexual Activity  . Alcohol use: Perry  . Drug use: Perry  . Sexual activity: Not Currently  Other Topics Concern  . Not on file  Social History Narrative   Divorced for 26 years in 2016. 2 sons- 1 son had been in prison now living with her in 2019.  2 granddaughters with 1 living close.        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       Hobbies: watch tv-survivor, dancing with the stars, enjoy alone time. Best friend [name] patient of Dr. Yong Channel. Enjoys work, time on Teaching laboratory technician.    Social Determinants of Health   Financial Resource Strain:   . Difficulty of Paying Living Expenses: Not on file  Food Insecurity:   . Worried About Charity fundraiser in the Last Year: Not on file  . Ran Out of Food in the Last Year: Not on file  Transportation Needs:   . Lack of Transportation (Medical): Not on file  . Lack of Transportation (Non-Medical): Not on file  Physical Activity:   . Days of Exercise per Week: Not on file  . Minutes of Exercise per Session: Not on file    Stress:   . Feeling of Stress : Not on file  Social Connections:   . Frequency of Communication with Friends and Family: Not on file  . Frequency of Social Gatherings with Friends and Family: Not on file  . Attends Religious Services: Not on file  . Active Member of Clubs or Organizations: Not on file  . Attends Archivist Meetings: Not on file  . Marital Status: Not on file   Allergies  Allergen Reactions  . Celexa [Citalopram Hydrobromide]     psychosis  . Cephalosporins     REACTION: tongue swelling  . Clarithromycin     REACTION: blisters in mouth  . Doxycycline Hyclate     REACTION: nausea, vomiting  . Levofloxacin     REACTION: tongue swells  . Penicillins     REACTION: per patient causes rash,hives  . Rosuvastatin Other (See Comments)    Pt reports causes bilateral lower extremity muscle aches.   . Zolpidem Tartrate     REACTION: difficulty with concentration   Family History  Problem Relation Age of Onset  . Heart disease Mother   . Hypertension Mother   . Diabetes Mother   . Dementia Mother   . COPD Father   . Dementia Maternal Grandmother   . Colon cancer Neg Hx      Current Outpatient Medications (Cardiovascular):  .  amLODipine (NORVASC) 10 MG tablet, Take 1 tablet (10 mg total) by mouth daily. .  Bempedoic Acid (NEXLETOL) 180 MG TABS, Take 1 tablet by mouth daily. Marland Kitchen  ezetimibe (ZETIA) 10 MG tablet, Take 1 tablet (10 mg total) by mouth daily. .  irbesartan (AVAPRO) 300 MG tablet, Take 1 tablet (300 mg total) by mouth daily. .  metoprolol succinate (TOPROL-XL) 50 MG 24 hr tablet, Take 1 tablet (50 mg total) by mouth daily. Take with or immediately following a meal. .  nitroGLYCERIN (NITROSTAT) 0.4 MG SL tablet, Place 1 tablet (0.4 mg total) under the tongue every 5 (five) minutes as needed for chest pain. Marland Kitchen  omega-3 acid ethyl esters (LOVAZA) 1 g capsule, Take 2 capsules (2 g total) by mouth daily.  Current Outpatient Medications (Respiratory):   .  albuterol (PROVENTIL HFA;VENTOLIN HFA) 108 (90 Base) MCG/ACT inhaler, Inhale 2 puffs into the lungs every 6 (six) hours as needed for wheezing or shortness of breath. .  budesonide-formoterol (SYMBICORT) 160-4.5 MCG/ACT inhaler, Inhale 2 puffs into the lungs as needed. Marland Kitchen  levocetirizine (XYZAL) 5 MG tablet, TAKE ONE TABLET EVERY MORNING  Current Outpatient Medications (Analgesics):  .  acetaminophen (TYLENOL) 325 MG tablet, Take 650 mg by mouth every 6 (six) hours as needed. Marland Kitchen  aspirin EC 81 MG tablet,  Take 1 tablet (81 mg total) by mouth daily.   Current Outpatient Medications (Other):  Marland Kitchen  ALPRAZolam (XANAX) 1 MG tablet, TAKE ONE TABLET AT BEDTIME AS NEEDED FOR SLEEP .  Magnesium 200 MG TABS, Take 1 tablet (200 mg total) by mouth daily. .  pantoprazole (PROTONIX) 20 MG tablet, Take 1 tablet (20 mg total) by mouth daily. Marland Kitchen  venlafaxine XR (EFFEXOR-XR) 75 MG 24 hr capsule, TAKE ONE CAPSULE EACH DAY WITH BREAKFAST .  Vitamin D, Ergocalciferol, (DRISDOL) 50000 units CAPS capsule, Take 2,000 Units by mouth daily.     Past medical history, social, surgical and family history all reviewed in electronic medical record.  Perry pertanent information unless stated regarding to the chief complaint.   Review of Systems:  Perry headache, visual changes, nausea, vomiting, diarrhea, constipation, dizziness, abdominal pain, skin rash, fevers, chills, night sweats, weight loss, swollen lymph nodes, body aches, joint swelling,chest pain, shortness of breath, mood changes.  Positive muscle aches  Objective  Height 5' (1.524 m), weight 169 lb (76.7 kg).    General: Perry apparent distress alert and oriented x3 mood and affect normal, dressed appropriately.  HEENT: Pupils equal, extraocular movements intact  Respiratory: Patient's speak in full sentences and does not appear short of breath  Cardiovascular: Perry lower extremity edema, non tender, Perry erythema  Skin: Warm dry intact with Perry signs of infection or  rash on extremities or on axial skeleton.  Abdomen: Soft nontender  Neuro: Cranial nerves II through XII are intact, neurovascularly intact in all extremities with 2+ DTRs and 2+ pulses.  Lymph: Perry lymphadenopathy of posterior or anterior cervical chain or axillae bilaterally.  Gait normal with good balance and coordination.  MSK:  tender with limited range of motion and good stability and symmetric strength and tone of shoulders, elbows, wrist, hip, knee and ankles bilaterally.   Low back exam does have some loss of lordosis with some degenerative scoliosis.  Some mild tightness with Corky Sox on the right side greater than left.  Negative straight leg test noted today.  4+ out of 5 strength in lower extremities bilaterally.  Deep tendon reflexes intact  Osteopathic findings  C2 flexed rotated and side bent right C4 flexed rotated and side bent left C6 flexed rotated and side bent left T3 extended rotated and side bent right inhaled third rib T9 extended rotated and side bent left L2 flexed rotated and side bent right Sacrum right on right     Impression and Recommendations:     This case required medical decision making of moderate complexity. The above documentation has been reviewed and is accurate and complete Felicia Pulley, DO       Note: This dictation was prepared with Dragon dictation along with smaller phrase technology. Any transcriptional errors that result from this process are unintentional.

## 2019-11-08 NOTE — Patient Instructions (Signed)
See me again in 6-8 weeks ?

## 2019-11-09 ENCOUNTER — Encounter: Payer: Self-pay | Admitting: Family Medicine

## 2019-11-09 NOTE — Assessment & Plan Note (Signed)
Sacroiliac dysfunction noted.  Discussed which activities to do which wants to avoid.  increase activity as tolerated.  Patient has responded well to osteopathic manipulation.  Encourage patient to continue to wait work on weight loss with patient has done well in the past

## 2019-11-09 NOTE — Assessment & Plan Note (Signed)
Patient is willing to discuss with primary care provider about discontinuing the Xanax now that she understands why he has been trying to titrate her down

## 2019-11-09 NOTE — Assessment & Plan Note (Signed)
Decision today to treat with OMT was based on Physical Exam  After verbal consent patient was treated with HVLA, ME, FPR techniques in cervical, thoracic, rib,  lumbar and sacral areas  Patient tolerated the procedure well with improvement in symptoms  Patient given exercises, stretches and lifestyle modifications  See medications in patient instructions if given  Patient will follow up in 4-8 weeks 

## 2019-12-27 ENCOUNTER — Encounter: Payer: Self-pay | Admitting: Family Medicine

## 2019-12-27 ENCOUNTER — Ambulatory Visit: Payer: Medicare Other | Admitting: Family Medicine

## 2019-12-27 ENCOUNTER — Other Ambulatory Visit: Payer: Self-pay

## 2019-12-27 VITALS — BP 140/82 | HR 67 | Ht 60.0 in | Wt 169.0 lb

## 2019-12-27 DIAGNOSIS — M533 Sacrococcygeal disorders, not elsewhere classified: Secondary | ICD-10-CM

## 2019-12-27 DIAGNOSIS — M999 Biomechanical lesion, unspecified: Secondary | ICD-10-CM

## 2019-12-27 DIAGNOSIS — M170 Bilateral primary osteoarthritis of knee: Secondary | ICD-10-CM

## 2019-12-27 NOTE — Progress Notes (Signed)
[facility] [place] Phone: [phone] Subjective:   [name], am serving as a scribe for Dr. Hulan Saas. This visit occurred during the SARS-CoV-2 public health emergency.  Safety protocols were in place, including screening questions prior to the visit, additional usage of staff PPE, and extensive cleaning of exam room while observing appropriate contact time as indicated for disinfecting solutions.   I'm seeing this patient by the request  of:  Marin Olp, MD  CC: Bilateral knee pain, back pain follow-up  [id number]  Laytoya H Kuether is a 74 y.o. female coming in with complaint of bilateral knee pain and back pain. Patient's pain of medial aspects. Has had injections that have helped.   Back pain has improved since getting new shoes and inserts.  Still has some pain.  Has responded well to manipulation.  Patient states that although no radiation down the legs, no pain that has been bad enough to stop her from activity at the moment.  More than knee pain that seems to be getting more problems.     Past Medical History:  Diagnosis Date  . Adenomatous polyp 11/23/2005  . Anxiety   . Chronic diastolic CHF (congestive heart failure) ([facility])   . Coronary artery disease    a. HC on 08/14/16 showed RCA CTO s/p PCI, 60% LCX disease managed medically with recs to consider staged PCI if continued symptoms.   Marland Kitchen DIVERTICULOSIS, COLON 04/22/2007       . GERD (gastroesophageal reflux disease)   . History of shingles   . Hypertension   . Hypertensive heart disease   . Internal hemorrhoids   . Osteoarthritis    "knees, hands, lower back" (08/14/2016)  . PUD (peptic ulcer disease)   . Sleep apnea    a. resolved with weight loss   Past Surgical History:  Procedure Laterality Date  . BREAST EXCISIONAL BIOPSY    . BREAST SURGERY Left 1980s   Fibrous Tumors removed   . CARDIAC CATHETERIZATION N/A 08/14/2016   Procedure: Left Heart Cath and Coronary Angiography;  Surgeon: Sherren Mocha, MD;  Location: [facility];  Service: Cardiovascular;  Laterality: N/A;  . CARDIAC CATHETERIZATION N/A 08/14/2016   Procedure: Coronary Stent Intervention;  Surgeon: Sherren Mocha, MD;  Location: [facility];  Service: Cardiovascular;  Laterality: N/A;  . CATARACT EXTRACTION W/ INTRAOCULAR LENS  IMPLANT, BILATERAL Bilateral 05/2009  . CORONARY ANGIOPLASTY    . detached retina left eye     may 2019  . TUBAL LIGATION  1970s   Social History   Socioeconomic History  . Marital status: Divorced    Spouse name: Not on file  . Number of children: Not on file  . Years of education: Not on file  . Highest education level: Not on file  Occupational History  . Occupation: Cleaning  Tobacco Use  . Smoking status: Former Smoker    Packs/day: 1.00    Years: 26.00    Pack years: 26.00    Types: Cigarettes    Quit date: 1989    Years since quitting: 32.1  . Smokeless tobacco: Never Used  Substance and Sexual Activity  . Alcohol use: No  . Drug use: No  . Sexual activity: Not Currently  Other Topics Concern  . Not on file  Social History Narrative   Divorced for 42 years in 2016. 2 sons- 1 son had been in prison now living with her in  2019.  2 granddaughters with 1 living close.        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       Hobbies: watch tv-survivor, dancing with the stars, enjoy alone time. Best friend [name] patient of Dr. Yong Channel. Enjoys work, time on Teaching laboratory technician.    Social Determinants of Health   Financial Resource Strain:   . Difficulty of Paying Living Expenses: Not on file  Food Insecurity:   . Worried About Charity fundraiser in the Last Year: Not on file  . Ran Out of Food in the Last Year: Not on file  Transportation Needs:   . Lack of Transportation (Medical): Not on file  . Lack of Transportation (Non-Medical): Not on file   Physical Activity:   . Days of Exercise per Week: Not on file  . Minutes of Exercise per Session: Not on file  Stress:   . Feeling of Stress : Not on file  Social Connections:   . Frequency of Communication with Friends and Family: Not on file  . Frequency of Social Gatherings with Friends and Family: Not on file  . Attends Religious Services: Not on file  . Active Member of Clubs or Organizations: Not on file  . Attends Archivist Meetings: Not on file  . Marital Status: Not on file   Allergies  Allergen Reactions  . Celexa [Citalopram Hydrobromide]     psychosis  . Cephalosporins     REACTION: tongue swelling  . Clarithromycin     REACTION: blisters in mouth  . Doxycycline Hyclate     REACTION: nausea, vomiting  . Levofloxacin     REACTION: tongue swells  . Penicillins     REACTION: per patient causes rash,hives  . Rosuvastatin Other (See Comments)    Pt reports causes bilateral lower extremity muscle aches.   . Zolpidem Tartrate     REACTION: difficulty with concentration   Family History  Problem Relation Age of Onset  . Heart disease Mother   . Hypertension Mother   . Diabetes Mother   . Dementia Mother   . COPD Father   . Dementia Maternal Grandmother   . Colon cancer Neg Hx      Current Outpatient Medications (Cardiovascular):  .  amLODipine (NORVASC) 10 MG tablet, Take 1 tablet (10 mg total) by mouth daily. .  Bempedoic Acid (NEXLETOL) 180 MG TABS, Take 1 tablet by mouth daily. Marland Kitchen  ezetimibe (ZETIA) 10 MG tablet, Take 1 tablet (10 mg total) by mouth daily. .  irbesartan (AVAPRO) 300 MG tablet, Take 1 tablet (300 mg total) by mouth daily. .  metoprolol succinate (TOPROL-XL) 50 MG 24 hr tablet, Take 1 tablet (50 mg total) by mouth daily. Take with or immediately following a meal. .  omega-3 acid ethyl esters (LOVAZA) 1 g capsule, Take 2 capsules (2 g total) by mouth daily. .  nitroGLYCERIN (NITROSTAT) 0.4 MG SL tablet, Place 1 tablet (0.4 mg total)  under the tongue every 5 (five) minutes as needed for chest pain.  Current Outpatient Medications (Respiratory):  .  albuterol (PROVENTIL HFA;VENTOLIN HFA) 108 (90 Base) MCG/ACT inhaler, Inhale 2 puffs into the lungs every 6 (six) hours as needed for wheezing or shortness of breath. .  budesonide-formoterol (SYMBICORT) 160-4.5 MCG/ACT inhaler, Inhale 2 puffs into the lungs as needed. Marland Kitchen  levocetirizine (XYZAL) 5 MG tablet, TAKE ONE TABLET EVERY MORNING  Current Outpatient Medications (Analgesics):  .  acetaminophen (  TYLENOL) 325 MG tablet, Take 650 mg by mouth every 6 (six) hours as needed. Marland Kitchen  aspirin EC 81 MG tablet, Take 1 tablet (81 mg total) by mouth daily.   Current Outpatient Medications (Other):  Marland Kitchen  ALPRAZolam (XANAX) 1 MG tablet, TAKE ONE TABLET AT BEDTIME AS NEEDED FOR SLEEP .  Magnesium 200 MG TABS, Take 1 tablet (200 mg total) by mouth daily. .  pantoprazole (PROTONIX) 20 MG tablet, Take 1 tablet (20 mg total) by mouth daily. Marland Kitchen  venlafaxine XR (EFFEXOR-XR) 75 MG 24 hr capsule, TAKE ONE CAPSULE EACH DAY WITH BREAKFAST .  Vitamin D, Ergocalciferol, (DRISDOL) 50000 units CAPS capsule, Take 2,000 Units by mouth daily.    Reviewed prior external information including notes and imaging from  primary care provider As well as notes that were available from care everywhere and other healthcare systems.  Past medical history, social, surgical and family history all reviewed in electronic medical record.  No pertanent information unless stated regarding to the chief complaint.   Review of Systems:  No headache, visual changes, nausea, vomiting, diarrhea, constipation, dizziness, abdominal pain, skin rash, fevers, chills, night sweats, weight loss, swollen lymph nodes, body aches, joint swelling, chest pain, shortness of breath, mood changes. POSITIVE muscle aches  Objective  Blood pressure 140/82, pulse 67, height 5' (1.524 m), weight 169 lb (76.7 kg), SpO2 98 %.   General: No apparent  distress alert and oriented x3 mood and affect normal, dressed appropriately.  HEENT: Pupils equal, extraocular movements intact  Respiratory: Patient's speak in full sentences and does not appear short of breath  Cardiovascular: No lower extremity edema, non tender, no erythema  Skin: Warm dry intact with no signs of infection or rash on extremities or on axial skeleton.  Abdomen: Soft nontender  Neuro: Cranial nerves II through XII are intact, neurovascularly intact in all extremities with 2+ DTRs and 2+ pulses.  Lymph: No lymphadenopathy of posterior or anterior cervical chain or axillae bilaterally.  Gait normal with good balance and coordination.  MSK:    Knee: Bilateral valgus deformity noted. abnormal thigh to calf ratio.  Tender to palpation over medial and PF joint line.  ROM full in flexion and extension and lower leg rotation. instability with valgus force.  painful patellar compression. Patellar glide with moderate crepitus. Patellar and quadriceps tendons unremarkable. Hamstring and quadriceps strength is normal.  Back exam does have some loss of lordosis.  Tender to palpation in paraspinal musculature lumbar spine right greater than left.  Positive Faber on the right side.  Negative straight leg test.  After informed written and verbal consent, patient was seated on exam table. Right knee was prepped with alcohol swab and utilizing anterolateral approach, patient's right knee space was injected with 4:1  marcaine 0.5%: Kenalog 40mg /dL. Patient tolerated the procedure well without immediate complications.  After informed written and verbal consent, patient was seated on exam table. Left knee was prepped with alcohol swab and utilizing anterolateral approach, patient's left knee space was injected with 4:1  marcaine 0.5%: Kenalog 40mg /dL. Patient tolerated the procedure well without immediate complications.  Osteopathic findings  C6 flexed rotated and side bent left T6  extended rotated and side bent right inhaled rib T10 extended rotated and side bent left L2 flexed rotated and side bent right Sacrum right on right      Impression and Recommendations:     This case required medical decision making of moderate complexity. The above documentation has been reviewed and is accurate  and complete [name], DO       Note: This dictation was prepared with Dragon dictation along with smaller phrase technology. Any transcriptional errors that result from this process are unintentional.

## 2019-12-27 NOTE — Assessment & Plan Note (Signed)
Stable overall, does have some arthritic changes of the back.  Is currently a chronic problem with mild exacerbations from time to time.  Patient continues to stay active and I would like her to continue to do so.  Follow-up with me again in 4 to 6 weeks

## 2019-12-27 NOTE — Assessment & Plan Note (Signed)
Decision today to treat with OMT was based on Physical Exam  After verbal consent patient was treated with HVLA, ME, FPR techniques in cervical, thoracic, rib,  lumbar and sacral areas  Patient tolerated the procedure well with improvement in symptoms  Patient given exercises, stretches and lifestyle modifications  See medications in patient instructions if given  Patient will follow up in 4-8 weeks 

## 2019-12-27 NOTE — Patient Instructions (Signed)
See me again in 6 weeks 

## 2019-12-27 NOTE — Assessment & Plan Note (Signed)
Repeat injection given today.  Tolerated the procedure well.  Last injection was over a year ago.  Hopefully patient will have just as much response.  Discussed posture and ergonomics, discussed which activities to do which wants to avoid.  Follow-up again 4 to 8 weeks.

## 2020-01-10 ENCOUNTER — Other Ambulatory Visit: Payer: Self-pay | Admitting: Family Medicine

## 2020-01-23 ENCOUNTER — Ambulatory Visit (INDEPENDENT_AMBULATORY_CARE_PROVIDER_SITE_OTHER): Payer: Medicare Other | Admitting: Family Medicine

## 2020-01-23 ENCOUNTER — Other Ambulatory Visit: Payer: Self-pay

## 2020-01-23 ENCOUNTER — Other Ambulatory Visit: Payer: Self-pay | Admitting: Family Medicine

## 2020-01-23 ENCOUNTER — Encounter: Payer: Self-pay | Admitting: Family Medicine

## 2020-01-23 VITALS — BP 138/84 | HR 88 | Temp 98.6°F | Ht 60.0 in | Wt 165.0 lb

## 2020-01-23 DIAGNOSIS — F5101 Primary insomnia: Secondary | ICD-10-CM

## 2020-01-23 DIAGNOSIS — R519 Headache, unspecified: Secondary | ICD-10-CM

## 2020-01-23 DIAGNOSIS — F32A Depression, unspecified: Secondary | ICD-10-CM

## 2020-01-23 DIAGNOSIS — F329 Major depressive disorder, single episode, unspecified: Secondary | ICD-10-CM

## 2020-01-23 DIAGNOSIS — R739 Hyperglycemia, unspecified: Secondary | ICD-10-CM | POA: Diagnosis not present

## 2020-01-23 DIAGNOSIS — I6529 Occlusion and stenosis of unspecified carotid artery: Secondary | ICD-10-CM | POA: Insufficient documentation

## 2020-01-23 DIAGNOSIS — E785 Hyperlipidemia, unspecified: Secondary | ICD-10-CM

## 2020-01-23 DIAGNOSIS — I25110 Atherosclerotic heart disease of native coronary artery with unstable angina pectoris: Secondary | ICD-10-CM

## 2020-01-23 DIAGNOSIS — I10 Essential (primary) hypertension: Secondary | ICD-10-CM

## 2020-01-23 DIAGNOSIS — I5032 Chronic diastolic (congestive) heart failure: Secondary | ICD-10-CM

## 2020-01-23 DIAGNOSIS — D692 Other nonthrombocytopenic purpura: Secondary | ICD-10-CM

## 2020-01-23 LAB — CBC WITH DIFFERENTIAL/PLATELET
Basophils Absolute: 0.1 10*3/uL (ref 0.0–0.1)
Basophils Relative: 1.5 % (ref 0.0–3.0)
Eosinophils Absolute: 0.5 10*3/uL (ref 0.0–0.7)
Eosinophils Relative: 4.7 % (ref 0.0–5.0)
HCT: 43 % (ref 36.0–46.0)
Hemoglobin: 14.7 g/dL (ref 12.0–15.0)
Lymphocytes Relative: 21.7 % (ref 12.0–46.0)
Lymphs Abs: 2.2 10*3/uL (ref 0.7–4.0)
MCHC: 34.2 g/dL (ref 30.0–36.0)
MCV: 88.5 fl (ref 78.0–100.0)
Monocytes Absolute: 1.1 10*3/uL — ABNORMAL HIGH (ref 0.1–1.0)
Monocytes Relative: 11.4 % (ref 3.0–12.0)
Neutro Abs: 6.1 10*3/uL (ref 1.4–7.7)
Neutrophils Relative %: 60.7 % (ref 43.0–77.0)
Platelets: 351 10*3/uL (ref 150.0–400.0)
RBC: 4.86 Mil/uL (ref 3.87–5.11)
RDW: 12.5 % (ref 11.5–15.5)
WBC: 10 10*3/uL (ref 4.0–10.5)

## 2020-01-23 LAB — COMPREHENSIVE METABOLIC PANEL
ALT: 36 U/L — ABNORMAL HIGH (ref 0–35)
AST: 36 U/L (ref 0–37)
Albumin: 4.7 g/dL (ref 3.5–5.2)
Alkaline Phosphatase: 75 U/L (ref 39–117)
BUN: 28 mg/dL — ABNORMAL HIGH (ref 6–23)
CO2: 30 mEq/L (ref 19–32)
Calcium: 10.2 mg/dL (ref 8.4–10.5)
Chloride: 99 mEq/L (ref 96–112)
Creatinine, Ser: 0.92 mg/dL (ref 0.40–1.20)
GFR: 59.74 mL/min — ABNORMAL LOW (ref >60.00)
Glucose, Bld: 117 mg/dL — ABNORMAL HIGH (ref 70–99)
Potassium: 4.9 mEq/L (ref 3.5–5.1)
Sodium: 139 mEq/L (ref 135–145)
Total Bilirubin: 0.8 mg/dL (ref 0.2–1.2)
Total Protein: 7.6 g/dL (ref 6.0–8.3)

## 2020-01-23 LAB — HEMOGLOBIN A1C: Hgb A1c MFr Bld: 6.3 % (ref 4.6–6.5)

## 2020-01-23 MED ORDER — NITROGLYCERIN 0.4 MG SL SUBL: 0.4000 mg | SUBLINGUAL_TABLET | SUBLINGUAL | 6 refills | Status: DC | PRN

## 2020-01-23 MED ORDER — ALPRAZOLAM 1 MG PO TABS: ORAL_TABLET | ORAL | 1 refills | Status: DC

## 2020-01-23 MED ORDER — ALBUTEROL SULFATE HFA 108 (90 BASE) MCG/ACT IN AERS: 2.0000 | INHALATION_SPRAY | Freq: Four times a day (QID) | RESPIRATORY_TRACT | 5 refills | Status: AC | PRN

## 2020-01-23 MED ORDER — PANTOPRAZOLE SODIUM 20 MG PO TBEC: 20.0000 mg | DELAYED_RELEASE_TABLET | Freq: Every day | ORAL | 3 refills | Status: DC

## 2020-01-23 NOTE — Progress Notes (Signed)
Phone [phone] In person visit   Subjective:   Felicia Perry is a 74 y.o. year old very pleasant female patient who presents for/with See problem oriented charting Chief Complaint  Patient presents with  . Hypertension  . Hyperlipidemia  . Depression    This visit occurred during the SARS-CoV-2 public health emergency.  Safety protocols were in place, including screening questions prior to the visit, additional usage of staff PPE, and extensive cleaning of exam room while observing appropriate contact time as indicated for disinfecting solutions.   Past Medical History-  Patient Active Problem List   Diagnosis Date Noted  . Carotid stenosis 01/23/2020    Priority: High  . Chronic diastolic CHF (congestive heart failure) ([facility]) 01/12/2019    Priority: High  . Coronary artery disease     Priority: High  . Syncope 08/04/2016    Priority: High  . Hyperlipidemia, unspecified 07/11/2018    Priority: Medium  . H/O hyperparathyroidism 08/18/2016    Priority: Medium  . Vitamin D deficiency 08/20/2015    Priority: Medium  . Chronic pain syndrome 03/26/2015    Priority: Medium  . Insomnia 07/13/2014    Priority: Medium  . GERD (gastroesophageal reflux disease) 02/25/2011    Priority: Medium  . Depression 04/22/2007    Priority: Medium  . Essential hypertension 04/22/2007    Priority: Medium  . Asthma 04/22/2007    Priority: Medium  . Degenerative arthritis of knee, bilateral 12/08/2018    Priority: Low  . Partial hamstring tear, initial encounter 11/16/2018    Priority: Low  . Nonallopathic lesion of sacral region 07/05/2018    Priority: Low  . Nonallopathic lesion of cervical region 07/05/2018    Priority: Low  . DDD (degenerative disc disease), cervical 05/04/2018    Priority: Low  . Degenerative arthritis of left knee 09/15/2017    Priority: Low  . PUD (peptic ulcer disease)     Priority: Low  . Hyperglycemia 08/06/2015    Priority: Low  . Xerosis of skin  08/06/2015    Priority: Low  . Hypersomnia with sleep apnea 03/26/2015    Priority: Low  . Nonallopathic lesion of lumbosacral region 11/13/2014    Priority: Low  . Osteoarthritis of left lower extremity 10/17/2014    Priority: Low  . Chronic meniscal tear of knee 10/17/2014    Priority: Low  . Nonallopathic lesion of thoracic region 09/14/2014    Priority: Low  . Nonallopathic lesion-rib cage 08/21/2014    Priority: Low  . Tendinopathy of right rotator cuff 07/30/2014    Priority: Low  . Piriformis syndrome of right side 07/30/2014    Priority: Low  . Limited joint range of motion 07/13/2014    Priority: Low  . Rash 07/13/2014    Priority: Low  . SI (sacroiliac) joint dysfunction 10/10/2007    Priority: Low  . LOW BACK PAIN 07/01/2007    Priority: Low    Medications- reviewed and updated Current Outpatient Medications  Medication Sig Dispense Refill  . acetaminophen (TYLENOL) 325 MG tablet Take 650 mg by mouth every 6 (six) hours as needed.    Marland Kitchen albuterol (VENTOLIN HFA) 108 (90 Base) MCG/ACT inhaler Inhale 2 puffs into the lungs every 6 (six) hours as needed for wheezing or shortness of breath. 18 g 5  . ALPRAZolam (XANAX) 1 MG tablet TAKE ONE TABLET AT BEDTIME AS NEEDED FOR SLEEP 90 tablet 1  . amLODipine (NORVASC) 10 MG tablet Take 1 tablet (10 mg total) by mouth daily.  90 tablet 3  . aspirin EC 81 MG tablet Take 1 tablet (81 mg total) by mouth daily. 90 tablet 3  . Bempedoic Acid (NEXLETOL) 180 MG TABS Take 1 tablet by mouth daily. 30 tablet 11  . budesonide-formoterol (SYMBICORT) 160-4.5 MCG/ACT inhaler Inhale 2 puffs into the lungs as needed.    . ezetimibe (ZETIA) 10 MG tablet Take 1 tablet (10 mg total) by mouth daily. 90 tablet 2  . irbesartan (AVAPRO) 300 MG tablet Take 1 tablet (300 mg total) by mouth daily. 90 tablet 2  . levocetirizine (XYZAL) 5 MG tablet TAKE ONE TABLET EVERY MORNING 90 tablet 0  . Magnesium 200 MG TABS Take 1 tablet (200 mg total) by mouth  daily. 30 each   . metoprolol succinate (TOPROL-XL) 50 MG 24 hr tablet Take 1 tablet (50 mg total) by mouth daily. Take with or immediately following a meal. 90 tablet 2  . nitroGLYCERIN (NITROSTAT) 0.4 MG SL tablet Place 1 tablet (0.4 mg total) under the tongue every 5 (five) minutes as needed for chest pain. 25 tablet 6  . omega-3 acid ethyl esters (LOVAZA) 1 g capsule Take 2 capsules (2 g total) by mouth daily. 60 capsule 11  . pantoprazole (PROTONIX) 20 MG tablet Take 1 tablet (20 mg total) by mouth daily. 90 tablet 3  . venlafaxine XR (EFFEXOR-XR) 75 MG 24 hr capsule TAKE ONE CAPSULE EACH DAY WITH BREAKFAST 90 capsule 0  . Vitamin D, Ergocalciferol, (DRISDOL) 50000 units CAPS capsule Take 2,000 Units by mouth daily.      No current facility-administered medications for this visit.       Objective:  BP 138/84   Pulse 88   Temp 98.6 F (37 C) (Temporal)   Ht 5' (1.524 m)   Wt 165 lb (74.8 kg)   SpO2 98%   BMI 32.22 kg/m  Gen: NAD, resting comfortably No frontal sinus tenderness. RIght TM normal CV: RRR no murmurs rubs or gallops Lungs: CTAB no crackles, wheeze, rhonchi Abdomen: soft/nontender/nondistended/normal bowel sounds.  Ext: no edema Skin: warm, dry    Assessment and Plan   # Right frontal headache S:gets a mild headache in right frontal area. Lasts for a few hours every morning then resolves. 2-3/10 pain. Has not wosened over time. Happens 5/7 days of the week.    ROS- No facial or extremity weakness. No slurred words or trouble swallowing. no blurry vision or double vision. No new paresthesias. No confusion or word finding difficulties.  A/P: new headache pattern in 74 year old female with no history of headaches- will get MRI of brain to rule out malignancy   # Chronic joint pain S:follows with Dr. Tamala Julian- for knees, hip, back, left side of leg bothers her. Had injections in knees about three weeks ago in both knees.  A/P: Feels like knees doing better lately  after recent injection- she is thankful for that.    # Seasonal allergies S:occasional nose runs if cleaning or around dogs. On xyzal A/P: reasonable control- continue current meds. Gets itchy without the medicine.    # CAD/ senile purpura S:compliant with aspirin- gets easy bruising. Patient has some chest pain briefly when gets anxious- if she stops watching news in those cases it will resolve. Also will have issues with stairs- feels short o fbreath. Has not had to take nitroglycerin. Feels like symptoms slightly worse overall  A/P: with worsening shortness of breath and chest pain issues with anxiety or exertion- recommended she go ahead  and schedule cardiology follow up instead of waiting until November.  Advised to avoid overexertion- she has not had many issues at work even though she is active as paces herself - did refill nitroglycerin as needed. She states symptoms under 5 minutes so not sure it would help. Occasionally has some palpitations as well- may need cardiac monitor - discussed senile purpura diagnosis- is stable  # Hypertension  S:controlled on metoprolol 50 mg extended release, irbesartan 300 mg, amlodipine 10 mg. Patient does check at home and are in normal range. Does add salt to food. Tries to maintain a heart healthy diet.  A/P: reasonable control thoguh high normal- continue current medicines   # Depression/insomnia/anxiety S: controlled on Effexor 75 mg with phq9 of 2 (she states issues have been more anxiety/stress- denies depression)- Does know that she has had increased issues after watching news. Also uses alprazolam for sleep- no issues the next day with fatigue or fall issues. She is pretty dependent on this for 8 hours of sleep and if doesn't get that much sleep does not do well A/P:  Reasonable control- glad she is avoiding the news  # Asthma S:doing well recently.  Symbicort in winter months usually.  Never using albuterol fortunately  A/P: doing well-  continue current meds. Also recommended symbicort if she gets a cold for SMART therapy    # Diastolic CHF S:listed as a medical problem in the past.  No longer requiring Lasix.  Stable/coninue to monitor. No recent swelling in legs or orthopnea. Down 4 lbs from january Wt Readings from Last 3 Encounters:  01/23/20 165 lb (74.8 kg)  12/27/19 169 lb (76.7 kg)  11/08/19 169 lb (76.7 kg)  A/P: appears to be well controlled- glad she is doing well off lasix  #hyperlipidemia S: compliant with bepedoic acid (on a grant) and zetia and lovaza Lab Results  Component Value Date   CHOL 100 06/22/2019   HDL 32 (L) 06/22/2019   LDLCALC Comment (A) 06/22/2019   LDLDIRECT 31 06/22/2019   TRIG 357 (H) 06/22/2019   CHOLHDL 3.1 06/22/2019   A/P: reasonable control other than triglycerides- will continue current meds and cardiology follow up. She is statin intolerant    # hyperglycemia- working on watching sugars. Active with work- shouldn't really push  Herself until cardiology follow up.  Update a1c and hoping not worsening Lab Results  Component Value Date   HGBA1C 6.1 07/25/2019   Recommended follow up: 6 months for physical  Future Appointments  Date Time Provider [place]  02/07/2020  3:45 PM [name], DO [facility] None  06/27/2020  8:00 AM [facility] 4 [facility]   Lab/Order associations:   ICD-10-CM   1. Coronary artery disease involving native coronary artery of native heart with unstable angina pectoris ([place])  I25.110   2. Chronic diastolic CHF (congestive heart failure) ([facility])  I50.32   3. Hyperlipidemia, unspecified hyperlipidemia type  E78.5 CBC with Differential/Platelet    Comprehensive metabolic panel  4. Essential hypertension  I10   5. Primary insomnia  F51.01   6. Depression, unspecified depression type  F32.9   7. Nonintractable episodic headache, unspecified headache type  R51.9 MR Brain Wo Contrast  8. Senile purpura ([facility])  D69.2   9. Hyperglycemia   R73.9 Hemoglobin A1c    Meds ordered this encounter  Medications  . ALPRAZolam (XANAX) 1 MG tablet    Sig: TAKE ONE TABLET AT BEDTIME AS NEEDED FOR SLEEP    Dispense:  90 tablet    Refill:  1  . albuterol (VENTOLIN HFA) 108 (90 Base) MCG/ACT inhaler    Sig: Inhale 2 puffs into the lungs every 6 (six) hours as needed for wheezing or shortness of breath.    Dispense:  18 g    Refill:  5  . pantoprazole (PROTONIX) 20 MG tablet    Sig: Take 1 tablet (20 mg total) by mouth daily.    Dispense:  90 tablet    Refill:  3  . nitroGLYCERIN (NITROSTAT) 0.4 MG SL tablet    Sig: Place 1 tablet (0.4 mg total) under the tongue every 5 (five) minutes as needed for chest pain.    Dispense:  25 tablet    Refill:  6   Return precautions advised.  Garret Reddish, MD

## 2020-01-23 NOTE — Patient Instructions (Addendum)
Please stop by lab before you go If you do not have mychart- we will call you about results within 5 business days of [place] receiving them.  If you have mychart- we will send your results within 3 business days of [place] receiving them.  If abnormal or we want to clarify a result, we will call or mychart you to make sure you receive the message.  If you have questions or concerns or don't hear within 5 business days, please send [place] a message or call us.   No changes today though I do want you to call cardiology for sooner follow up and take it easy until that visit- if worsening symptoms or symptoms last more than 5 minutes definitely take a nitroglycerin. If you have to take 2 and still having issues call 911  We will call you within two weeks about your referral to MRI of brain. If you do not hear within 3 weeks, give [place] a call.    Recommended follow up: Return in about 6 months (around 07/25/2020) for physical or sooner if needed.

## 2020-01-30 ENCOUNTER — Telehealth: Payer: Self-pay | Admitting: Cardiology

## 2020-01-30 NOTE — Telephone Encounter (Signed)
I spoke with the patient who reports that she has been having chest tightness for about 6 days now. It usually comes on in the afternoon after lunch time. She states that is does not occur after she eats, however she has recently changed her diet and pain seems to have worsened since then. She reports that the front of her chest is tight and she does have some pain in the middle of her back. The pain goes away at night. She states that she does have some SOB with exertion and some lightheadedness but denies any dizziness. 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  Patient also reports swelling in her arms and legs. She reports that her forearms throb when her hands are swollen. She took Lasix on Sunday which seemed to help.  Patient has not taken any nitroglycerin. Advised her that if the pain comes back that she should take a nitroglycerin to see if she has any relief. Advised that if she ends up needing to take 3 tablets that she should call 911. Patient verbalized understanding and will call 911 with any worsening symptoms. Advised that I would forward to Dr. Meda Coffee and her nurse Karlene Einstein for any further recommendations.

## 2020-01-30 NOTE — Telephone Encounter (Signed)
Pt c/o of Chest Pain: STAT if CP now or developed within 24 hours  1. Are you having CP right now? no  2. Are you experiencing any other symptoms (ex. SOB, nausea, vomiting, sweating)? SOB is worse, sweating  3. How long have you been experiencing CP? 6 days  4. Is your CP continuous or coming and going? Comes and goes  5. Have you taken Nitroglycerin? No  Patient states for 6 days she has been having a tightness in her chest. She states it starts when she stops working and rests. She states she is also having SOB, sweating, and swelling. She states she has swelling in her hands, feet and forearms. She states she has gained about 25 lbs in a year. She would like to know if she should take her nitroglycerin and if she should make an appointment soon. Please advise.    ?

## 2020-01-30 NOTE — Telephone Encounter (Signed)
Spoke with Dr. Meda Coffee and she advised that we get the pt an appt for sometime this week to see an APP, and if her symptoms worsen, then she should seek immediate medical attention or call 911.  Pt made aware of these recommendations.  Pt education provided on S/S to look for and that warrant immediate medical attention.  Advised the pt to take her current meds as prescribed, and Dr. Meda Coffee said if she is getting to have to take 2 nitro with no relief, then she should call 911.  Advised the pt to limit her sodium intake and elevate her extremities at rest.  Scheduled the pt to come and see Robbie Lis PA-C for this Thursday 4/8 at 0915.  Advised the pt to arrive 15 mins prior to this appt. Pt verbalized understanding and agrees with this plan.

## 2020-01-31 NOTE — Progress Notes (Signed)
Cardiology Office Note    Date:  02/01/2020   ID:  Yosan, Zerger 05-19-1946, MRN [id number]  PCP:  Marin Olp, MD  Cardiologist: Dr. Meda Coffee   Chief Complaint: CP, SOB and dizziness   History of Present Illness:   Felicia Perry is a 74 y.o. female with history of CAD (dx 2017 - RCA CTO s/p complex PCI, 60% LCx disease, normal LVEF 08/14/16),  Chronic diastolic CHF, carotid artery disease, HLD with statin intolerance, HTN, PUD, asthma, anxiety seen for multiple problems.    She was seen in the office by Dr. Meda Coffee on 08/12/16 for evaluation of chest pain and a positive stress test which was ordered by PCP. This demonstrated a hypotensive response to exercise and a medium defect of moderate severity present in the basal inferoseptal, mid inferoseptal and apical septal and mid and apical inferior location that was reversible and consistent with ischemia.  LHC on 08/14/16 showed RCA CTO s/p complex PCI, 60% LCX disease managed medically with recs to consider staged PCI if continued symptoms. Last stress test 01/2017 was low risk study. Echo 01/2017 showed LVEF of 55-60%, elevated filling pressure, mild to moderate AI.  Last seen by Dr. Meda Coffee 05/2019.   Here today oday for follow-up with multiple complaints.  Seen for similar problem by Dr. Yong Channel 3/30.  Recommended MRI of brain to rule out malignancy for intermittent headache.  She cleans houses for living.  No symptoms while doing work.  She complains of squeezing chest tightness after she comes home from work, this occurs at rest.  No associated shortness of breath, diaphoresis, nausea or vomiting.  Symptoms different than her prior angina when she had stenting.  Reports intermittent palpitation, mostly when anxious and stressed out.  Has a lot of work-related stress and with roommate.  No syncope, orthopnea, PND or melena.  Past Medical History:  Diagnosis Date  . Adenomatous polyp 11/23/2005  . Anxiety   . Chronic diastolic CHF  (congestive heart failure) ([place])   . Coronary artery disease    a. HC on 08/14/16 showed RCA CTO s/p PCI, 60% LCX disease managed medically with recs to consider staged PCI if continued symptoms.   Marland Kitchen DIVERTICULOSIS, COLON 04/22/2007       . GERD (gastroesophageal reflux disease)   . History of shingles   . Hypertension   . Hypertensive heart disease   . Internal hemorrhoids   . Osteoarthritis    "knees, hands, lower back" (08/14/2016)  . PUD (peptic ulcer disease)   . Sleep apnea    a. resolved with weight loss    Past Surgical History:  Procedure Laterality Date  . BREAST EXCISIONAL BIOPSY    . BREAST SURGERY Left 1980s   Fibrous Tumors removed   . CARDIAC CATHETERIZATION N/A 08/14/2016   Procedure: Left Heart Cath and Coronary Angiography;  Surgeon: Sherren Mocha, MD;  Location: [facility];  Service: Cardiovascular;  Laterality: N/A;  . CARDIAC CATHETERIZATION N/A 08/14/2016   Procedure: Coronary Stent Intervention;  Surgeon: Sherren Mocha, MD;  Location: [facility];  Service: Cardiovascular;  Laterality: N/A;  . CATARACT EXTRACTION W/ INTRAOCULAR LENS  IMPLANT, BILATERAL Bilateral 05/2009  . CORONARY ANGIOPLASTY    . detached retina left eye     may 2019  . TUBAL LIGATION  1970s    Current Medications: Prior to Admission medications   Medication Sig Start Date End Date Taking? Authorizing Provider  acetaminophen (TYLENOL) 325 MG tablet Take 650  mg by mouth every 6 (six) hours as needed.    [provider]  albuterol (VENTOLIN HFA) 108 (90 Base) MCG/ACT inhaler Inhale 2 puffs into the lungs every 6 (six) hours as needed for wheezing or shortness of breath. 01/23/20   Marin Olp, MD  ALPRAZolam Duanne Moron) 1 MG tablet TAKE ONE TABLET AT BEDTIME AS NEEDED FOR SLEEP 01/23/20   Marin Olp, MD  amLODipine (NORVASC) 10 MG tablet Take 1 tablet (10 mg total) by mouth daily. 06/22/19   Dorothy Spark, MD  aspirin EC 81 MG tablet Take 1 tablet (81  mg total) by mouth daily. 08/12/16   Dorothy Spark, MD  Bempedoic Acid (NEXLETOL) 180 MG TABS Take 1 tablet by mouth daily. 05/11/19   Dorothy Spark, MD  budesonide-formoterol [facility]) 160-4.5 MCG/ACT inhaler Inhale 2 puffs into the lungs as needed.    [provider]  ezetimibe (ZETIA) 10 MG tablet Take 1 tablet (10 mg total) by mouth daily. 06/22/19   Dorothy Spark, MD  irbesartan (AVAPRO) 300 MG tablet Take 1 tablet (300 mg total) by mouth daily. 06/22/19   Dorothy Spark, MD  levocetirizine Harlow Ohms) 5 MG tablet TAKE ONE TABLET EVERY MORNING 01/11/20   Marin Olp, MD  Magnesium 200 MG TABS Take 1 tablet (200 mg total) by mouth daily. 08/11/18   Dorothy Spark, MD  metoprolol succinate (TOPROL-XL) 50 MG 24 hr tablet Take 1 tablet (50 mg total) by mouth daily. Take with or immediately following a meal. 06/22/19   Dorothy Spark, MD  nitroGLYCERIN (NITROSTAT) 0.4 MG SL tablet Place 1 tablet (0.4 mg total) under the tongue every 5 (five) minutes as needed for chest pain. 01/23/20 04/22/20  Marin Olp, MD  omega-3 acid ethyl esters (LOVAZA) 1 g capsule Take 2 capsules (2 g total) by mouth daily. 06/22/19   Dorothy Spark, MD  pantoprazole (PROTONIX) 20 MG tablet Take 1 tablet (20 mg total) by mouth daily. 01/23/20   Marin Olp, MD  venlafaxine XR (EFFEXOR-XR) 75 MG 24 hr capsule TAKE ONE CAPSULE EACH DAY WITH BREAKFAST 01/10/20   [name], DO  Vitamin D, Ergocalciferol, (DRISDOL) 50000 units CAPS capsule Take 2,000 Units by mouth daily.     [provider]    Allergies:   Celexa [citalopram hydrobromide], Cephalosporins, Clarithromycin, Doxycycline hyclate, Levofloxacin, Penicillins, Rosuvastatin, and Zolpidem tartrate   Social History   Socioeconomic History  . Marital status: Divorced    Spouse name: Not on file  . Number of children: Not on file  . Years of education: Not on file  . Highest education level: Not on file    Occupational History  . Occupation: Cleaning  Tobacco Use  . Smoking status: Former Smoker    Packs/day: 1.00    Years: 26.00    Pack years: 26.00    Types: Cigarettes    Quit date: 1989    Years since quitting: 32.2  . Smokeless tobacco: Never Used  Substance and Sexual Activity  . Alcohol use: No  . Drug use: No  . Sexual activity: Not Currently  Other Topics Concern  . Not on file  Social History Narrative   Divorced for 46 years in 2016. 2 sons- 1 son had been in prison now living with her in 2019.  2 granddaughters with 1 living close.        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       Hobbies: watch tv-survivor, dancing with the stars, enjoy alone time. Best friend [name] patient of Dr. Yong Channel. Enjoys work, time on Teaching laboratory technician.    Social Determinants of Health   Financial Resource Strain:   . Difficulty of Paying Living Expenses:   Food Insecurity:   . Worried About Charity fundraiser in the Last Year:   . Arboriculturist in the Last Year:   Transportation Needs:   . Film/video editor (Medical):   Marland Kitchen Lack of Transportation (Non-Medical):   Physical Activity:   . Days of Exercise per Week:   . Minutes of Exercise per Session:   Stress:   . Feeling of Stress :   Social Connections:   . Frequency of Communication with Friends and Family:   . Frequency of Social Gatherings with Friends and Family:   . Attends Religious Services:   . Active Member of Clubs or Organizations:   . Attends Archivist Meetings:   Marland Kitchen Marital Status:      Family History:  The patient's family history includes COPD in her father; Dementia in her maternal grandmother and mother; Diabetes in her mother; Heart disease in her mother; Hypertension in her mother.   ROS:   Please see the history of present illness.    ROS All other systems reviewed and are negative.   PHYSICAL EXAM:   VS:  BP 110/76   Pulse 78   Ht 5' (1.524 m)    Wt 160 lb 9.6 oz (72.8 kg)   SpO2 98%   BMI 31.37 kg/m    GEN: Well nourished, well developed, in no acute distress  HEENT: normal  Neck: no JVD, carotid bruits, or masses Cardiac: RRR; positive murmurs, rubs, or gallops,no edema  Respiratory:  clear to auscultation bilaterally, normal work of breathing GI: soft, nontender, nondistended, + BS MS: no deformity or atrophy  Skin: warm and dry, no rash Neuro:  Alert and Oriented x 3, Strength and sensation are intact Psych: euthymic mood, full affect  Wt Readings from Last 3 Encounters:  02/01/20 160 lb 9.6 oz (72.8 kg)  01/23/20 165 lb (74.8 kg)  12/27/19 169 lb (76.7 kg)      Studies/Labs Reviewed:   EKG:  EKG is ordered today.  The ekg ordered today demonstrates normal sinus rhythm at rate of 78 bpm  Recent Labs: 01/23/2020: ALT 36; BUN 28; Creatinine, Ser 0.92; Hemoglobin 14.7; Platelets 351.0; Potassium 4.9; Sodium 139   Lipid Panel    Component Value Date/Time   CHOL 100 06/22/2019 0907   TRIG 357 (H) 06/22/2019 0907   HDL 32 (L) 06/22/2019 0907   CHOLHDL 3.1 06/22/2019 0907   CHOLHDL 4 05/17/2019 1540   VLDL 69.0 (H) 05/17/2019 1540   LDLCALC Comment (A) 06/22/2019 0907   LDLDIRECT 31 06/22/2019 0907   LDLDIRECT 76.0 05/17/2019 1540    Additional studies/ records that were reviewed today include:    Carotid doppler 06/2019 Summary:  Right Carotid: Velocities in the right ICA are consistent with a 1-39%  stenosis.   Left Carotid: Velocities in the left ICA are consistent with a 40-59%  stenosis.        Non-hemodynamically significant plaque <50% noted in the  CCA.   Echocardiogram: 01/2017 Left ventricle: The cavity size was normal. Systolic function was  normal. The estimated ejection fraction was in the range of 55%  to 60%. Wall motion was normal; there were no regional  wall  motion abnormalities. Doppler parameters are consistent with high  ventricular filling pressure.  - Aortic  valve: Transvalvular velocity was within the normal range.  There was no stenosis. There was mild to moderate regurgitation.  Mean gradient (S): 6 mm Hg. Peak gradient (S): 13 mm Hg.  Regurgitation pressure half-time: 493 ms.  - Mitral valve: Mildly calcified annulus. Transvalvular velocity  was within the normal range. There was no evidence for stenosis.  There was trivial regurgitation.  - Right ventricle: The cavity size was normal. Wall thickness was  normal. Systolic function was normal.  - Atrial septum: No defect or patent foramen ovale was identified  by color flow Doppler.  - Tricuspid valve: There was mild regurgitation.  - Pulmonary arteries: Systolic pressure was within the normal  range. PA peak pressure: 30 mm Hg (S).   Stress test 01/2017  The left ventricular ejection fraction is hyperdynamic (>65%).  Nuclear stress EF: 74%.  The study is normal.  This is a low risk study.    Coronary Stent Intervention 07/2016  Left Heart Cath and Coronary Angiography  Conclusion  1. Severe stenosis with chronic total occlusion of the mid right coronary artery, treated successfully with complex stenting as detailed 2. Moderately severe stenosis of the mid left circumflex involving a large obtuse marginal branch 3. Patency of the LAD with mild nonobstructive disease 4. Normal LV function by noninvasive assessment  Recommendations: Dual antiplatelet therapy with aspirin and brilinta for at least one year. Consider staged PCI of the left circumflex bifurcation if recurrent anginal symptoms arise.    Diagnostic Dominance: Right  Intervention     ASSESSMENT & PLAN:     1. Chest tightness with history of CAD s/p DES to RCA in 2017, medically treated LCx -Her symptoms (squeezing chest tightness with intermittent radiation to back and across the chest) starts after she comes home from work.  No associated shortness of breath, diaphoresis, nausea or vomiting.   Did not try sublingual nitroglycerin.  She cleans houses for living.  Symptoms different angina when patient had stenting. -We will get stress test and echocardiogram for further evaluation. -She will try sublingual nitroglycerin and let us know with response.    2. HLD - statin intolerance - 05/17/2019: VLDL 69.0 06/22/2019: Cholesterol, Total 100; HDL 32; LDL Calculated Comment; Triglycerides 357  - On Bempedoic acid and lovaza  3. Carotid artery disease - Last doppler 06/2019 with right ICA with 1-39% stenosis and left ICA with a 40-59% stenosis.  -Continue aspirin  4. HTN -Blood pressure stable on current medication  5.  Anxiety -Per PCP  6. Murmur - Echo in 01/2017 showed mild to moderate AI. Update echo.          Medication Adjustments/Labs and Tests Ordered: Current medicines are reviewed at length with the patient today.  Concerns regarding medicines are outlined above.  Medication changes, Labs and Tests ordered today are listed in the Patient Instructions below. Patient Instructions  Medication Instructions:  Your physician recommends that you continue on your current medications as directed. Please refer to the Current Medication list given to you today.  *If you need a refill on your cardiac medications before your next appointment, please call your pharmacy*   Lab Work: None ordered  If you have labs (blood work) drawn today and your tests are completely normal, you will receive your results only by: Marland Kitchen MyChart Message (if you have MyChart) OR . A paper copy in the mail If you  have any lab test that is abnormal or we need to change your treatment, we will call you to review the results.   Testing/Procedures: Your physician has requested that you have an echocardiogram. Echocardiography is a painless test that uses sound waves to create images of your heart. It provides your doctor with information about the size and shape of your heart and how well your  heart's chambers and valves are working. This procedure takes approximately one hour. There are no restrictions for this procedure.  Your physician has requested that you have a lexiscan myoview. For further information please visit [email or web address]. Please follow instruction sheet, as given.     Follow-Up: At [facility], you and your health needs are our priority.  As part of our continuing mission to provide you with exceptional heart care, we have created designated Provider Care Teams.  These Care Teams include your primary Cardiologist (physician) and Advanced Practice Providers (APPs -  Physician Assistants and Nurse Practitioners) who all work together to provide you with the care you need, when you need it.  We recommend signing up for the patient portal called "MyChart".  Sign up information is provided on this After Visit Summary.  MyChart is used to connect with patients for Virtual Visits (Telemedicine).  Patients are able to view lab/test results, encounter notes, upcoming appointments, etc.  Non-urgent messages can be sent to your provider as well.   To learn more about what you can do with MyChart, go to [email or web address].    Your next appointment:   6 week(s)  03/06/2020 ARRIVE AT 9:30 FOR REGISTRATION   The format for your next appointment:   In Person  Provider:   Robbie Lis, PA-C   Other Instructions  Echocardiogram An echocardiogram is a procedure that uses painless sound waves (ultrasound) to produce an image of the heart. Images from an echocardiogram can provide important information about:  Signs of coronary artery disease (CAD).  Aneurysm detection. An aneurysm is a weak or damaged part of an artery wall that bulges out from the normal force of blood pumping through the body.  Heart size and shape. Changes in the size or shape of the heart can be associated with certain conditions, including heart failure, aneurysm, and CAD.  Heart muscle  function.  Heart valve function.  Signs of a past heart attack.  Fluid buildup around the heart.  Thickening of the heart muscle.  A tumor or infectious growth around the heart valves. Tell a health care provider about:  Any allergies you have.  All medicines you are taking, including vitamins, herbs, eye drops, creams, and over-the-counter medicines.  Any blood disorders you have.  Any surgeries you have had.  Any medical conditions you have.  Whether you are pregnant or may be pregnant. What are the risks? Generally, this is a safe procedure. However, problems may occur, including:  Allergic reaction to dye (contrast) that may be used during the procedure. What happens before the procedure? No specific preparation is needed. You may eat and drink normally. What happens during the procedure?   An IV tube may be inserted into one of your veins.  You may receive contrast through this tube. A contrast is an injection that improves the quality of the pictures from your heart.  A gel will be applied to your chest.  A wand-like tool (transducer) will be moved over your chest. The gel will help to transmit the sound waves from the transducer.  The sound waves will  harmlessly bounce off of your heart to allow the heart images to be captured in real-time motion. The images will be recorded on a computer. The procedure may vary among health care providers and hospitals. What happens after the procedure?  You may return to your normal, everyday life, including diet, activities, and medicines, unless your health care provider tells you not to do that. Summary  An echocardiogram is a procedure that uses painless sound waves (ultrasound) to produce an image of the heart.  Images from an echocardiogram can provide important information about the size and shape of your heart, heart muscle function, heart valve function, and fluid buildup around your heart.  You do not need to do  anything to prepare before this procedure. You may eat and drink normally.  After the echocardiogram is completed, you may return to your normal, everyday life, unless your health care provider tells you not to do that. This information is not intended to replace advice given to you by your health care provider. Make sure you discuss any questions you have with your health care provider. Document Revised: 02/02/2019 Document Reviewed: 11/14/2016 Elsevier Patient Education  2020 [place].    Cardiac Nuclear Scan A cardiac nuclear scan is a test that is done to check the flow of blood to your heart. It is done when you are resting and when you are exercising. The test looks for problems such as:  Not enough blood reaching a portion of the heart.  The heart muscle not working as it should. You may need this test if:  You have heart disease.  You have had lab results that are not normal.  You have had heart surgery or a balloon procedure to open up blocked arteries (angioplasty).  You have chest pain.  You have shortness of breath. In this test, a special dye (tracer) is put into your bloodstream. The tracer will travel to your heart. A camera will then take pictures of your heart to see how the tracer moves through your heart. This test is usually done at a hospital and takes 2-4 hours. Tell a doctor about:  Any allergies you have.  All medicines you are taking, including vitamins, herbs, eye drops, creams, and over-the-counter medicines.  Any problems you or family members have had with anesthetic medicines.  Any blood disorders you have.  Any surgeries you have had.  Any medical conditions you have.  Whether you are pregnant or may be pregnant. What are the risks? Generally, this is a safe test. However, problems may occur, such as:  Serious chest pain and heart attack. This is only a risk if the stress portion of the test is done.  Rapid heartbeat.  A feeling of  warmth in your chest. This feeling usually does not last long.  Allergic reaction to the tracer. What happens before the test?  Ask your doctor about changing or stopping your normal medicines. This is important.  Follow instructions from your doctor about what you cannot eat or drink.  Remove your jewelry on the day of the test. What happens during the test?  An IV tube will be inserted into one of your veins.  Your doctor will give you a small amount of tracer through the IV tube.  You will wait for 20-40 minutes while the tracer moves through your bloodstream.  Your heart will be monitored with an electrocardiogram (ECG).  You will lie down on an exam table.  Pictures of your heart will be  taken for about 15-20 minutes.  You may also have a stress test. For this test, one of these things may be done: ? You will be asked to exercise on a treadmill or a stationary bike. ? You will be given medicines that will make your heart work harder. This is done if you are unable to exercise.  When blood flow to your heart has peaked, a tracer will again be given through the IV tube.  After 20-40 minutes, you will get back on the exam table. More pictures will be taken of your heart.  Depending on the tracer that is used, more pictures may need to be taken 3-4 hours later.  Your IV tube will be removed when the test is over. The test may vary among doctors and hospitals. What happens after the test?  Ask your doctor: ? Whether you can return to your normal schedule, including diet, activities, and medicines. ? Whether you should drink more fluids. This will help to remove the tracer from your body. Drink enough fluid to keep your pee (urine) pale yellow.  Ask your doctor, or the department that is doing the test: ? When will my results be ready? ? How will I get my results? Summary  A cardiac nuclear scan is a test that is done to check the flow of blood to your heart.  Tell  your doctor whether you are pregnant or may be pregnant.  Before the test, ask your doctor about changing or stopping your normal medicines. This is important.  Ask your doctor whether you can return to your normal activities. You may be asked to drink more fluids. This information is not intended to replace advice given to you by your health care provider. Make sure you discuss any questions you have with your health care provider. Document Revised: 02/01/2019 Document Reviewed: 03/28/2018 Elsevier Patient Education  [place], [place], [place]  02/01/2020 10:03 AM    [facility] [address], [place], [place]  [address] Phone: [phone]; Fax: [phone]

## 2020-02-01 ENCOUNTER — Encounter: Payer: Self-pay | Admitting: *Deleted

## 2020-02-01 ENCOUNTER — Ambulatory Visit: Payer: Medicare Other | Admitting: Physician Assistant

## 2020-02-01 ENCOUNTER — Encounter: Payer: Self-pay | Admitting: Physician Assistant

## 2020-02-01 ENCOUNTER — Other Ambulatory Visit: Payer: Self-pay

## 2020-02-01 ENCOUNTER — Other Ambulatory Visit: Payer: Self-pay | Admitting: Pharmacist

## 2020-02-01 VITALS — BP 110/76 | HR 78 | Ht 60.0 in | Wt 160.6 lb

## 2020-02-01 DIAGNOSIS — I25118 Atherosclerotic heart disease of native coronary artery with other forms of angina pectoris: Secondary | ICD-10-CM | POA: Diagnosis not present

## 2020-02-01 DIAGNOSIS — G729 Myopathy, unspecified: Secondary | ICD-10-CM

## 2020-02-01 DIAGNOSIS — E785 Hyperlipidemia, unspecified: Secondary | ICD-10-CM | POA: Diagnosis not present

## 2020-02-01 DIAGNOSIS — I351 Nonrheumatic aortic (valve) insufficiency: Secondary | ICD-10-CM

## 2020-02-01 DIAGNOSIS — I10 Essential (primary) hypertension: Secondary | ICD-10-CM

## 2020-02-01 DIAGNOSIS — R002 Palpitations: Secondary | ICD-10-CM

## 2020-02-01 DIAGNOSIS — R011 Cardiac murmur, unspecified: Secondary | ICD-10-CM | POA: Diagnosis not present

## 2020-02-01 MED ORDER — NEXLETOL 180 MG PO TABS: 1.0000 | ORAL_TABLET | Freq: Every day | ORAL | 11 refills | Status: DC

## 2020-02-01 NOTE — Patient Instructions (Signed)
Medication Instructions:  Your physician recommends that you continue on your current medications as directed. Please refer to the Current Medication list given to you today.  *If you need a refill on your cardiac medications before your next appointment, please call your pharmacy*   Lab Work: None ordered  If you have labs (blood work) drawn today and your tests are completely normal, you will receive your results only by: Marland Kitchen MyChart Message (if you have MyChart) OR . A paper copy in the mail If you have any lab test that is abnormal or we need to change your treatment, we will call you to review the results.   Testing/Procedures: Your physician has requested that you have an echocardiogram. Echocardiography is a painless test that uses sound waves to create images of your heart. It provides your doctor with information about the size and shape of your heart and how well your heart's chambers and valves are working. This procedure takes approximately one hour. There are no restrictions for this procedure.  Your physician has requested that you have a lexiscan myoview. For further information please visit [email or web address]. Please follow instruction sheet, as given.     Follow-Up: At [facility], you and your health needs are our priority.  As part of our continuing mission to provide you with exceptional heart care, we have created designated Provider Care Teams.  These Care Teams include your primary Cardiologist (physician) and Advanced Practice Providers (APPs -  Physician Assistants and Nurse Practitioners) who all work together to provide you with the care you need, when you need it.  We recommend signing up for the patient portal called "MyChart".  Sign up information is provided on this After Visit Summary.  MyChart is used to connect with patients for Virtual Visits (Telemedicine).  Patients are able to view lab/test results, encounter notes, upcoming appointments, etc.   Non-urgent messages can be sent to your provider as well.   To learn more about what you can do with MyChart, go to [email or web address].    Your next appointment:   6 week(s)  03/06/2020 ARRIVE AT 9:30 FOR REGISTRATION   The format for your next appointment:   In Person  Provider:   Robbie Lis, PA-C   Other Instructions  Echocardiogram An echocardiogram is a procedure that uses painless sound waves (ultrasound) to produce an image of the heart. Images from an echocardiogram can provide important information about:  Signs of coronary artery disease (CAD).  Aneurysm detection. An aneurysm is a weak or damaged part of an artery wall that bulges out from the normal force of blood pumping through the body.  Heart size and shape. Changes in the size or shape of the heart can be associated with certain conditions, including heart failure, aneurysm, and CAD.  Heart muscle function.  Heart valve function.  Signs of a past heart attack.  Fluid buildup around the heart.  Thickening of the heart muscle.  A tumor or infectious growth around the heart valves. Tell a health care provider about:  Any allergies you have.  All medicines you are taking, including vitamins, herbs, eye drops, creams, and over-the-counter medicines.  Any blood disorders you have.  Any surgeries you have had.  Any medical conditions you have.  Whether you are pregnant or may be pregnant. What are the risks? Generally, this is a safe procedure. However, problems may occur, including:  Allergic reaction to dye (contrast) that may be used during the procedure. What happens before the procedure? No  specific preparation is needed. You may eat and drink normally. What happens during the procedure?   An IV tube may be inserted into one of your veins.  You may receive contrast through this tube. A contrast is an injection that improves the quality of the pictures from your heart.  A gel will be  applied to your chest.  A wand-like tool (transducer) will be moved over your chest. The gel will help to transmit the sound waves from the transducer.  The sound waves will harmlessly bounce off of your heart to allow the heart images to be captured in real-time motion. The images will be recorded on a computer. The procedure may vary among health care providers and hospitals. What happens after the procedure?  You may return to your normal, everyday life, including diet, activities, and medicines, unless your health care provider tells you not to do that. Summary  An echocardiogram is a procedure that uses painless sound waves (ultrasound) to produce an image of the heart.  Images from an echocardiogram can provide important information about the size and shape of your heart, heart muscle function, heart valve function, and fluid buildup around your heart.  You do not need to do anything to prepare before this procedure. You may eat and drink normally.  After the echocardiogram is completed, you may return to your normal, everyday life, unless your health care provider tells you not to do that. This information is not intended to replace advice given to you by your health care provider. Make sure you discuss any questions you have with your health care provider. Document Revised: 02/02/2019 Document Reviewed: 11/14/2016 Elsevier Patient Education  2020 [place].    Cardiac Nuclear Scan A cardiac nuclear scan is a test that is done to check the flow of blood to your heart. It is done when you are resting and when you are exercising. The test looks for problems such as:  Not enough blood reaching a portion of the heart.  The heart muscle not working as it should. You may need this test if:  You have heart disease.  You have had lab results that are not normal.  You have had heart surgery or a balloon procedure to open up blocked arteries (angioplasty).  You have chest pain.   You have shortness of breath. In this test, a special dye (tracer) is put into your bloodstream. The tracer will travel to your heart. A camera will then take pictures of your heart to see how the tracer moves through your heart. This test is usually done at a hospital and takes 2-4 hours. Tell a doctor about:  Any allergies you have.  All medicines you are taking, including vitamins, herbs, eye drops, creams, and over-the-counter medicines.  Any problems you or family members have had with anesthetic medicines.  Any blood disorders you have.  Any surgeries you have had.  Any medical conditions you have.  Whether you are pregnant or may be pregnant. What are the risks? Generally, this is a safe test. However, problems may occur, such as:  Serious chest pain and heart attack. This is only a risk if the stress portion of the test is done.  Rapid heartbeat.  A feeling of warmth in your chest. This feeling usually does not last long.  Allergic reaction to the tracer. What happens before the test?  Ask your doctor about changing or stopping your normal medicines. This is important.  Follow instructions from your doctor about  what you cannot eat or drink.  Remove your jewelry on the day of the test. What happens during the test?  An IV tube will be inserted into one of your veins.  Your doctor will give you a small amount of tracer through the IV tube.  You will wait for 20-40 minutes while the tracer moves through your bloodstream.  Your heart will be monitored with an electrocardiogram (ECG).  You will lie down on an exam table.  Pictures of your heart will be taken for about 15-20 minutes.  You may also have a stress test. For this test, one of these things may be done: ? You will be asked to exercise on a treadmill or a stationary bike. ? You will be given medicines that will make your heart work harder. This is done if you are unable to exercise.  When blood flow  to your heart has peaked, a tracer will again be given through the IV tube.  After 20-40 minutes, you will get back on the exam table. More pictures will be taken of your heart.  Depending on the tracer that is used, more pictures may need to be taken 3-4 hours later.  Your IV tube will be removed when the test is over. The test may vary among doctors and hospitals. What happens after the test?  Ask your doctor: ? Whether you can return to your normal schedule, including diet, activities, and medicines. ? Whether you should drink more fluids. This will help to remove the tracer from your body. Drink enough fluid to keep your pee (urine) pale yellow.  Ask your doctor, or the department that is doing the test: ? When will my results be ready? ? How will I get my results? Summary  A cardiac nuclear scan is a test that is done to check the flow of blood to your heart.  Tell your doctor whether you are pregnant or may be pregnant.  Before the test, ask your doctor about changing or stopping your normal medicines. This is important.  Ask your doctor whether you can return to your normal activities. You may be asked to drink more fluids. This information is not intended to replace advice given to you by your health care provider. Make sure you discuss any questions you have with your health care provider. Document Revised: 02/01/2019 Document Reviewed: 03/28/2018 Elsevier Patient Education  [place].

## 2020-02-07 ENCOUNTER — Other Ambulatory Visit: Payer: Self-pay

## 2020-02-07 ENCOUNTER — Ambulatory Visit: Payer: Medicare Other | Admitting: Family Medicine

## 2020-02-07 ENCOUNTER — Encounter: Payer: Self-pay | Admitting: Family Medicine

## 2020-02-07 VITALS — BP 138/74 | HR 72 | Ht 60.0 in | Wt 160.0 lb

## 2020-02-07 DIAGNOSIS — M170 Bilateral primary osteoarthritis of knee: Secondary | ICD-10-CM

## 2020-02-07 DIAGNOSIS — G8929 Other chronic pain: Secondary | ICD-10-CM

## 2020-02-07 DIAGNOSIS — M5441 Lumbago with sciatica, right side: Secondary | ICD-10-CM

## 2020-02-07 DIAGNOSIS — M999 Biomechanical lesion, unspecified: Secondary | ICD-10-CM

## 2020-02-07 NOTE — Assessment & Plan Note (Signed)
Significant improvement after the injections and is having no significant pain at all major.  Continue with conservative therapy otherwise

## 2020-02-07 NOTE — Progress Notes (Signed)
[facility] [place] Phone: [phone] Subjective:   Felicia Perry, am serving as a scribe for Dr. Hulan Saas. This visit occurred during the SARS-CoV-2 public health emergency.  Safety protocols were in place, including screening questions prior to the visit, additional usage of staff PPE, and extensive cleaning of exam room while observing appropriate contact time as indicated for disinfecting solutions.   I'm seeing this patient by the request  of:  Marin Olp, MD  CC: bilateral knee pain f/u back pain follow up   [id number]   12/27/2019 Repeat injection given today.  Tolerated the procedure well.  Last injection was over a year ago.  Hopefully patient will have just as much response.  Discussed posture and ergonomics, discussed which activities to do which wants to avoid.  Follow-up again 4 to 8 weeks.  Update 02/07/2020 Felicia Perry is a 74 y.o. female coming in with complaint of back pain and bilateral knee pain. Last seen on 12/27/2019 for OMT as well as knee injections. Patient states that the injections did help to alleviate her knee pain. Working on her posture and has helped as well, because she feels she is walking better  Patient has been able to be more active at the moment.  Very happy with this result     Past Medical History:  Diagnosis Date  . Adenomatous polyp 11/23/2005  . Anxiety   . Chronic diastolic CHF (congestive heart failure) ([place])   . Coronary artery disease    a. HC on 08/14/16 showed RCA CTO s/p PCI, 60% LCX disease managed medically with recs to consider staged PCI if continued symptoms.   Marland Kitchen DIVERTICULOSIS, COLON 04/22/2007       . GERD (gastroesophageal reflux disease)   . History of shingles   . Hypertension   . Hypertensive heart disease   . Internal hemorrhoids   . Osteoarthritis    "knees, hands, lower back" (08/14/2016)  . PUD (peptic ulcer disease)   . Sleep apnea    a. resolved with weight loss   Past Surgical History:  Procedure Laterality Date  . BREAST EXCISIONAL BIOPSY    . BREAST SURGERY Left 1980s   Fibrous Tumors removed   . CARDIAC CATHETERIZATION N/A 08/14/2016   Procedure: Left Heart Cath and Coronary Angiography;  Surgeon: Sherren Mocha, MD;  Location: [place] [facility];  Service: Cardiovascular;  Laterality: N/A;  . CARDIAC CATHETERIZATION N/A 08/14/2016   Procedure: Coronary Stent Intervention;  Surgeon: Sherren Mocha, MD;  Location: [place] [facility];  Service: Cardiovascular;  Laterality: N/A;  . CATARACT EXTRACTION W/ INTRAOCULAR LENS  IMPLANT, BILATERAL Bilateral 05/2009  . CORONARY ANGIOPLASTY    . detached retina left eye     may 2019  . TUBAL LIGATION  1970s   Social History   Socioeconomic History  . Marital status: Divorced    Spouse name: Not on file  . Number of children: Not on file  . Years of education: Not on file  . Highest education level: Not on file  Occupational History  . Occupation: Cleaning  Tobacco Use  . Smoking status: Former Smoker    Packs/day: 1.00    Years: 26.00    Pack years: 26.00    Types: Cigarettes    Quit date: 1989    Years since quitting: 32.3  . Smokeless tobacco: Never Used  Substance and Sexual Activity  . Alcohol use: Perry  . Drug use:  Perry  . Sexual activity: Not Currently  Other Topics Concern  . Not on file  Social History Narrative   Divorced for 11 years in 2016. 2 sons- 1 son had been in prison now living with her in 2019.  2 granddaughters with 1 living close.        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       Hobbies: watch tv-survivor, dancing with the stars, enjoy alone time. Best friend [name] patient of Dr. Yong Channel. Enjoys work, time on Teaching laboratory technician.    Social Determinants of Health   Financial Resource Strain:   . Difficulty of Paying Living Expenses:   Food Insecurity:   . Worried About Charity fundraiser in the  Last Year:   . Arboriculturist in the Last Year:   Transportation Needs:   . Film/video editor (Medical):   Marland Kitchen Lack of Transportation (Non-Medical):   Physical Activity:   . Days of Exercise per Week:   . Minutes of Exercise per Session:   Stress:   . Feeling of Stress :   Social Connections:   . Frequency of Communication with Friends and Family:   . Frequency of Social Gatherings with Friends and Family:   . Attends Religious Services:   . Active Member of Clubs or Organizations:   . Attends Archivist Meetings:   Marland Kitchen Marital Status:    Allergies  Allergen Reactions  . Celexa [Citalopram Hydrobromide]     psychosis  . Cephalosporins     REACTION: tongue swelling  . Clarithromycin     REACTION: blisters in mouth  . Doxycycline Hyclate     REACTION: nausea, vomiting  . Levofloxacin     REACTION: tongue swells  . Penicillins     REACTION: per patient causes rash,hives  . Rosuvastatin Other (See Comments)    Pt reports causes bilateral lower extremity muscle aches.   . Zolpidem Tartrate     REACTION: difficulty with concentration   Family History  Problem Relation Age of Onset  . Heart disease Mother   . Hypertension Mother   . Diabetes Mother   . Dementia Mother   . COPD Father   . Dementia Maternal Grandmother   . Colon cancer Neg Hx      Current Outpatient Medications (Cardiovascular):  .  amLODipine (NORVASC) 10 MG tablet, Take 1 tablet (10 mg total) by mouth daily. .  Bempedoic Acid (NEXLETOL) 180 MG TABS, Take 1 tablet by mouth daily. Marland Kitchen  ezetimibe (ZETIA) 10 MG tablet, Take 1 tablet (10 mg total) by mouth daily. .  irbesartan (AVAPRO) 300 MG tablet, Take 1 tablet (300 mg total) by mouth daily. .  metoprolol succinate (TOPROL-XL) 50 MG 24 hr tablet, Take 1 tablet (50 mg total) by mouth daily. Take with or immediately following a meal. .  nitroGLYCERIN (NITROSTAT) 0.4 MG SL tablet, Place 1 tablet (0.4 mg total) under the tongue every 5 (five)  minutes as needed for chest pain. Marland Kitchen  omega-3 acid ethyl esters (LOVAZA) 1 g capsule, Take 2 capsules (2 g total) by mouth daily.  Current Outpatient Medications (Respiratory):  .  albuterol (VENTOLIN HFA) 108 (90 Base) MCG/ACT inhaler, Inhale 2 puffs into the lungs every 6 (six) hours as needed for wheezing or shortness of breath. .  budesonide-formoterol (SYMBICORT) 160-4.5 MCG/ACT inhaler, Inhale 2 puffs into the lungs as needed. Marland Kitchen  levocetirizine (XYZAL) 5 MG tablet, TAKE ONE  TABLET EVERY MORNING  Current Outpatient Medications (Analgesics):  .  acetaminophen (TYLENOL) 325 MG tablet, Take 650 mg by mouth every 6 (six) hours as needed. Marland Kitchen  aspirin EC 81 MG tablet, Take 1 tablet (81 mg total) by mouth daily.   Current Outpatient Medications (Other):  Marland Kitchen  ALPRAZolam (XANAX) 1 MG tablet, TAKE ONE TABLET AT BEDTIME AS NEEDED FOR SLEEP .  Magnesium 200 MG TABS, Take 1 tablet (200 mg total) by mouth daily. .  pantoprazole (PROTONIX) 20 MG tablet, Take 1 tablet (20 mg total) by mouth daily. Marland Kitchen  venlafaxine XR (EFFEXOR-XR) 75 MG 24 hr capsule, TAKE ONE CAPSULE EACH DAY WITH BREAKFAST .  Vitamin D, Ergocalciferol, (DRISDOL) 50000 units CAPS capsule, Take 2,000 Units by mouth daily.    Reviewed prior external information including notes and imaging from  primary care provider As well as notes that were available from care everywhere and other healthcare systems.  Past medical history, social, surgical and family history all reviewed in electronic medical record.  Perry pertanent information unless stated regarding to the chief complaint.   Review of Systems:  Perry headache, visual changes, nausea, vomiting, diarrhea, constipation, dizziness, abdominal pain, skin rash, fevers, chills, night sweats, weight loss, swollen lymph nodes, body aches, joint swelling, chest pain, shortness of breath, mood changes. POSITIVE muscle aches but less than previous exam  Objective  Blood pressure 138/74, pulse 72,  height 5' (1.524 m), weight 160 lb (72.6 kg), SpO2 97 %.   General: Perry apparent distress alert and oriented x3 mood and affect normal, dressed appropriately.  HEENT: Pupils equal, extraocular movements intact  Respiratory: Patient's speak in full sentences and does not appear short of breath  Cardiovascular: Perry lower extremity edema, non tender, Perry erythema  Neuro: Cranial nerves II through XII are intact, neurovascularly intact in all extremities with 2+ DTRs and 2+ pulses.  Gait normal with good balance and coordination.  MSK: Knees do have some arthritic changes with some mild patella grinding but patient seems to be doing better overall.  Perry significant instability noted today.  Minimal tenderness on exam  Low back exam does have some tightness more in the thoracolumbar junction mild in the sacroiliac joint as well.  Decreased extension of the back of 5 degrees.  Mild tightness with Corky Sox test bilaterally  Osteopathic findings C5 flexed rotated and side bent left T3 extended rotated and side bent right inhaled third rib T8 extended rotated and side bent left L2 flexed rotated and side bent right Sacrum right on right     Impression and Recommendations:     This case required medical decision making of moderate complexity. The above documentation has been reviewed and is accurate and complete [name], DO       Note: This dictation was prepared with Dragon dictation along with smaller phrase technology. Any transcriptional errors that result from this process are unintentional.

## 2020-02-07 NOTE — Patient Instructions (Signed)
See me in 6-8 weeks 

## 2020-02-07 NOTE — Assessment & Plan Note (Signed)
Chronic issue, mild exacerbation, responded well to manipulation.  Discussed icing regimen and home exercises.  Discussed medication management continue the Effexor for more the radicular symptoms and chronic pain.  Follow-up with me again in 6 to 8 weeks.

## 2020-02-07 NOTE — Assessment & Plan Note (Signed)

## 2020-02-08 ENCOUNTER — Encounter: Payer: Self-pay | Admitting: Family Medicine

## 2020-02-12 ENCOUNTER — Other Ambulatory Visit: Payer: Self-pay

## 2020-02-12 ENCOUNTER — Ambulatory Visit
Admission: RE | Admit: 2020-02-12 | Discharge: 2020-02-12 | Disposition: A | Discharge status: Home / Self Care | Payer: Medicare Other | Source: Ambulatory Visit | Attending: Family Medicine | Admitting: Family Medicine

## 2020-02-12 DIAGNOSIS — R519 Headache, unspecified: Secondary | ICD-10-CM | POA: Diagnosis not present

## 2020-02-13 ENCOUNTER — Telehealth (HOSPITAL_COMMUNITY): Payer: Self-pay

## 2020-02-13 NOTE — Telephone Encounter (Signed)
Spoke with the patient, detailed instructions left with the patient. She stated that she understood and would be here for her test. Asked to call back with any questions. Felicia PerryDodie Parisi EMTP 

## 2020-02-13 NOTE — Progress Notes (Signed)
Notes from Dr. Rock Nephew news-largely normal MRI of the brain.  There are some signs of cholesterol on the brain but that is to be expected with your heart history.  We simply need to continue controlling cholesterol and other risk factors

## 2020-02-15 ENCOUNTER — Ambulatory Visit (HOSPITAL_BASED_OUTPATIENT_CLINIC_OR_DEPARTMENT_OTHER): Payer: Medicare Other

## 2020-02-15 ENCOUNTER — Other Ambulatory Visit: Payer: Self-pay

## 2020-02-15 ENCOUNTER — Ambulatory Visit (HOSPITAL_COMMUNITY): Payer: Medicare Other | Attending: Cardiovascular Disease

## 2020-02-15 DIAGNOSIS — R011 Cardiac murmur, unspecified: Secondary | ICD-10-CM | POA: Insufficient documentation

## 2020-02-15 DIAGNOSIS — I25118 Atherosclerotic heart disease of native coronary artery with other forms of angina pectoris: Secondary | ICD-10-CM | POA: Diagnosis not present

## 2020-02-15 DIAGNOSIS — R002 Palpitations: Secondary | ICD-10-CM | POA: Diagnosis not present

## 2020-02-15 DIAGNOSIS — E785 Hyperlipidemia, unspecified: Secondary | ICD-10-CM | POA: Diagnosis not present

## 2020-02-15 DIAGNOSIS — I10 Essential (primary) hypertension: Secondary | ICD-10-CM | POA: Diagnosis not present

## 2020-02-15 LAB — MYOCARDIAL PERFUSION IMAGING
LV dias vol: 57 mL (ref 46–106)
LV sys vol: 17 mL
Peak HR: 95 {beats}/min
Rest HR: 73 {beats}/min
SDS: 2
SRS: 0
SSS: 2
TID: 0.99

## 2020-02-15 MED ORDER — TECHNETIUM TC 99M TETROFOSMIN IV KIT
10.9000 | PACK | Freq: Once | INTRAVENOUS | Status: AC | PRN
  Administered 2020-02-15: 10.9 via INTRAVENOUS
  Filled 2020-02-15: qty 11

## 2020-02-15 MED ORDER — PERFLUTREN LIPID MICROSPHERE
1.0000 mL | INTRAVENOUS | Status: AC | PRN
  Administered 2020-02-15: 2 mL via INTRAVENOUS

## 2020-02-15 MED ORDER — REGADENOSON 0.4 MG/5ML IV SOLN
0.4000 mg | Freq: Once | INTRAVENOUS | Status: AC
  Administered 2020-02-15: 0.4 mg via INTRAVENOUS

## 2020-02-15 MED ORDER — TECHNETIUM TC 99M TETROFOSMIN IV KIT
32.2000 | PACK | Freq: Once | INTRAVENOUS | Status: AC | PRN
  Administered 2020-02-15: 32.2 via INTRAVENOUS
  Filled 2020-02-15: qty 33

## 2020-03-04 NOTE — Progress Notes (Signed)
Cardiology Office Note    Date:  03/06/2020   ID:  Felicia, Perry August 13, 1946, MRN [id number]  PCP:  Felicia Olp, MD  Cardiologist: Dr. Meda Coffee   Chief Complaint: 4 weeks  follow up  History of Present Illness:   Felicia Perry is a 74 y.o. female with history of CAD (dx 2017 - RCA CTO s/p complex PCI, 60% LCx disease, normal LVEF 08/14/16),  Chronic diastolic CHF, carotid artery disease, HLD with statin intolerance, HTN, PUD, asthma, anxiety seen for multiple problems.    She was seen in the office by Dr. Meda Coffee on 08/12/16 for evaluation of chest pain and a positive stress test which was ordered by PCP. This demonstrated a hypotensive response to exerciseand amedium defect of moderate severity present in the basal inferoseptal, mid inferoseptal and apical septal and mid and apical inferior locationthat wasreversible and consistent with ischemia.  LHC on 08/14/16 showed RCA CTO s/p complex PCI, 60% LCX disease managed medically with recs to consider staged PCI if continued symptoms.Last stress test 01/2017 was low risk study. Echo 01/2017 showed LVEF of 55-60%, elevated filling pressure, mild to moderate AI.  Seen by me April 2021 with chest tightness radiating to her back and across the chest.  Follow-up echocardiogram and stress test reassuring.  She felt this was due to acid reflux and diet changes.  No recurrent symptoms.  Patient denies chest pain, shortness of breath, orthopnea, PND, syncope, lower extremity edema or melena.  Past Medical History:  Diagnosis Date  . Adenomatous polyp 11/23/2005  . Anxiety   . Chronic diastolic CHF (congestive heart failure) ([place])   . Coronary artery disease    a. HC on 08/14/16 showed RCA CTO s/p PCI, 60% LCX disease managed medically with recs to consider staged PCI if continued symptoms.   Marland Kitchen DIVERTICULOSIS, COLON 04/22/2007       . GERD (gastroesophageal reflux disease)   . History of shingles   . Hypertension   . Hypertensive  heart disease   . Internal hemorrhoids   . Osteoarthritis    "knees, hands, lower back" (08/14/2016)  . PUD (peptic ulcer disease)   . Sleep apnea    a. resolved with weight loss    Past Surgical History:  Procedure Laterality Date  . BREAST EXCISIONAL BIOPSY    . BREAST SURGERY Left 1980s   Fibrous Tumors removed   . CARDIAC CATHETERIZATION N/A 08/14/2016   Procedure: Left Heart Cath and Coronary Angiography;  Surgeon: Sherren Mocha, MD;  Location: [facility];  Service: Cardiovascular;  Laterality: N/A;  . CARDIAC CATHETERIZATION N/A 08/14/2016   Procedure: Coronary Stent Intervention;  Surgeon: Sherren Mocha, MD;  Location: [facility];  Service: Cardiovascular;  Laterality: N/A;  . CATARACT EXTRACTION W/ INTRAOCULAR LENS  IMPLANT, BILATERAL Bilateral 05/2009  . CORONARY ANGIOPLASTY    . detached retina left eye     may 2019  . TUBAL LIGATION  1970s    Current Medications: Prior to Admission medications   Medication Sig Start Date End Date Taking? Authorizing Provider  acetaminophen (TYLENOL) 325 MG tablet Take 650 mg by mouth every 6 (six) hours as needed.    [provider]  albuterol (VENTOLIN HFA) 108 (90 Base) MCG/ACT inhaler Inhale 2 puffs into the lungs every 6 (six) hours as needed for wheezing or shortness of breath. 01/23/20   Felicia Olp, MD  ALPRAZolam Duanne Moron) 1 MG tablet TAKE ONE TABLET AT BEDTIME AS NEEDED FOR SLEEP  01/23/20   Felicia Olp, MD  amLODipine (NORVASC) 10 MG tablet Take 1 tablet (10 mg total) by mouth daily. 06/22/19   Dorothy Spark, MD  aspirin EC 81 MG tablet Take 1 tablet (81 mg total) by mouth daily. 08/12/16   Dorothy Spark, MD  Bempedoic Acid (NEXLETOL) 180 MG TABS Take 1 tablet by mouth daily. 02/01/20   Dorothy Spark, MD  budesonide-formoterol [facility]) 160-4.5 MCG/ACT inhaler Inhale 2 puffs into the lungs as needed.    [provider]  ezetimibe (ZETIA) 10 MG tablet Take 1 tablet (10 mg  total) by mouth daily. 06/22/19   Dorothy Spark, MD  irbesartan (AVAPRO) 300 MG tablet Take 1 tablet (300 mg total) by mouth daily. 06/22/19   Dorothy Spark, MD  levocetirizine Harlow Ohms) 5 MG tablet TAKE ONE TABLET EVERY MORNING 01/11/20   Felicia Olp, MD  Magnesium 200 MG TABS Take 1 tablet (200 mg total) by mouth daily. 08/11/18   Dorothy Spark, MD  metoprolol succinate (TOPROL-XL) 50 MG 24 hr tablet Take 1 tablet (50 mg total) by mouth daily. Take with or immediately following a meal. 06/22/19   Dorothy Spark, MD  nitroGLYCERIN (NITROSTAT) 0.4 MG SL tablet Place 1 tablet (0.4 mg total) under the tongue every 5 (five) minutes as needed for chest pain. 01/23/20 04/22/20  Felicia Olp, MD  omega-3 acid ethyl esters (LOVAZA) 1 g capsule Take 2 capsules (2 g total) by mouth daily. 06/22/19   Dorothy Spark, MD  pantoprazole (PROTONIX) 20 MG tablet Take 1 tablet (20 mg total) by mouth daily. 01/23/20   Felicia Olp, MD  venlafaxine XR (EFFEXOR-XR) 75 MG 24 hr capsule TAKE ONE CAPSULE EACH DAY WITH BREAKFAST 01/10/20   [name], DO  Vitamin D, Ergocalciferol, (DRISDOL) 50000 units CAPS capsule Take 2,000 Units by mouth daily.     [provider]    Allergies:   Celexa [citalopram hydrobromide], Cephalosporins, Clarithromycin, Doxycycline hyclate, Levofloxacin, Penicillins, Rosuvastatin, and Zolpidem tartrate   Social History   Socioeconomic History  . Marital status: Divorced    Spouse name: Not on file  . Number of children: Not on file  . Years of education: Not on file  . Highest education level: Not on file  Occupational History  . Occupation: Cleaning  Tobacco Use  . Smoking status: Former Smoker    Packs/day: 1.00    Years: 26.00    Pack years: 26.00    Types: Cigarettes    Quit date: 1989    Years since quitting: 32.3  . Smokeless tobacco: Never Used  Substance and Sexual Activity  . Alcohol use: No  . Drug use: No  . Sexual  activity: Not Currently  Other Topics Concern  . Not on file  Social History Narrative   Divorced for 28 years in 2016. 2 sons- 1 son had been in prison now living with her in 2019.  2 granddaughters with 1 living close.        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       Hobbies: watch tv-survivor, dancing with the stars, enjoy alone time. Best friend [name] patient of Dr. Yong Channel. Enjoys work, time on Teaching laboratory technician.    Social Determinants of Health   Financial Resource Strain:   . Difficulty of Paying Living Expenses:   Food Insecurity:   . Worried About Charity fundraiser in the  Last Year:   . [place] in the Last Year:   Transportation Needs:   . Film/video editor (Medical):   Marland Kitchen Lack of Transportation (Non-Medical):   Physical Activity:   . Days of Exercise per Week:   . Minutes of Exercise per Session:   Stress:   . Feeling of Stress :   Social Connections:   . Frequency of Communication with Friends and Family:   . Frequency of Social Gatherings with Friends and Family:   . Attends Religious Services:   . Active Member of Clubs or Organizations:   . Attends Archivist Meetings:   Marland Kitchen Marital Status:      Family History:  The patient's family history includes COPD in her father; Dementia in her maternal grandmother and mother; Diabetes in her mother; Heart disease in her mother; Hypertension in her mother.   ROS:   Please see the history of present illness.    ROS All other systems reviewed and are negative.   PHYSICAL EXAM:   VS:  BP 130/70   Pulse 72   Ht 5' (1.524 m)   Wt 164 lb 12.8 oz (74.8 kg)   SpO2 98%   BMI 32.19 kg/m    GEN: Well nourished, well developed, in no acute distress  HEENT: normal  Neck: no JVD, carotid bruits, or masses Cardiac: RRR; no murmurs, rubs, or gallops,no edema  Respiratory:  clear to auscultation bilaterally, normal work of breathing GI: soft, nontender,  nondistended, + BS MS: no deformity or atrophy  Skin: warm and dry, no rash Neuro:  Alert and Oriented x 3, Strength and sensation are intact Psych: euthymic mood, full affect  Wt Readings from Last 3 Encounters:  03/06/20 164 lb 12.8 oz (74.8 kg)  02/15/20 160 lb (72.6 kg)  02/07/20 160 lb (72.6 kg)      Studies/Labs Reviewed:   EKG:  EKG is not ordered today.   Recent Labs: 01/23/2020: ALT 36; BUN 28; Creatinine, Ser 0.92; Hemoglobin 14.7; Platelets 351.0; Potassium 4.9; Sodium 139   Lipid Panel    Component Value Date/Time   CHOL 100 06/22/2019 0907   TRIG 357 (H) 06/22/2019 0907   HDL 32 (L) 06/22/2019 0907   CHOLHDL 3.1 06/22/2019 0907   CHOLHDL 4 05/17/2019 1540   VLDL 69.0 (H) 05/17/2019 1540   LDLCALC Comment (A) 06/22/2019 0907   LDLDIRECT 31 06/22/2019 0907   LDLDIRECT 76.0 05/17/2019 1540    Additional studies/ records that were reviewed today include:   Echocardiogram: 01/2020 1. Left ventricular ejection fraction, by estimation, is 60 to 65%. The  left ventricle has normal function. The left ventricle has no regional  wall motion abnormalities. The left ventricular internal cavity size was  mildly dilated. There is severe left  ventricular hypertrophy. Left ventricular diastolic parameters were  normal.  2. Right ventricular systolic function is normal. The right ventricular  size is normal. There is mildly elevated pulmonary artery systolic  pressure.  3. The mitral valve is normal in structure. Mild mitral valve  regurgitation. No evidence of mitral stenosis.  4. The aortic valve is tricuspid. Aortic valve regurgitation is mild.  Sclerosis with small gradient but no stenosis.  5. The inferior vena cava is normal in size with greater than 50%  respiratory variability, suggesting right atrial pressure of 3 mmHg.   Stress test 01/2020   The left ventricular ejection fraction is hyperdynamic (>65%).  Nuclear stress EF: 71%.  There was no ST  segment deviation noted during stress.  Defect 1: There is a small defect of mild severity present in the apex location.  The study is normal.  This is a low risk study.   Normal stress nuclear study with mild apical thinning but no ischemia.  Gated ejection fraction 71% with normal wall motion.     ASSESSMENT & PLAN:    1. CAD Reassuring stress test.  Her symptoms most likely due to untreated GERD in setting of diet change.  No recurrent symptoms now.  Continue current medical therapy.  2.  Hyperlipidemia -On Bempedoic acid and lovaza  3.  Carotid artery disease -Due for repeat scan September 2021  4.  Hypertension -Blood pressure stable and well-controlled on current medications  Medication Adjustments/Labs and Tests Ordered: Current medicines are reviewed at length with the patient today.  Concerns regarding medicines are outlined above.  Medication changes, Labs and Tests ordered today are listed in the Patient Instructions below. Patient Instructions  Medication Instructions:  Your physician recommends that you continue on your current medications as directed. Please refer to the Current Medication list given to you today.  *If you need a refill on your cardiac medications before your next appointment, please call your pharmacy*   Lab Work: None ordered  If you have labs (blood work) drawn today and your tests are completely normal, you will receive your results only by: Marland Kitchen MyChart Message (if you have MyChart) OR . A paper copy in the mail If you have any lab test that is abnormal or we need to change your treatment, we will call you to review the results.   Testing/Procedures: None ordered   Follow-Up: At [facility], you and your health needs are our priority.  As part of our continuing mission to provide you with exceptional heart care, we have created designated Provider Care Teams.  These Care Teams include your primary Cardiologist (physician) and  Advanced Practice Providers (APPs -  Physician Assistants and Nurse Practitioners) who all work together to provide you with the care you need, when you need it.  We recommend signing up for the patient portal called "MyChart".  Sign up information is provided on this After Visit Summary.  MyChart is used to connect with patients for Virtual Visits (Telemedicine).  Patients are able to view lab/test results, encounter notes, upcoming appointments, etc.  Non-urgent messages can be sent to your provider as well.   To learn more about what you can do with MyChart, go to [email or web address].    Your next appointment:   12 month(s)  The format for your next appointment:   In Person  Provider:   You may see Felicia Dawley, MD or one of the following Advanced Practice Providers on your designated Care Team:    Melina Copa, PA-C  Ermalinda Barrios, PA-C    Other Instructions      Signed, Leanor Kail, [place]  03/06/2020 10:13 AM    [address], [place], [place]  [address] Phone: [phone]; Fax: [phone]

## 2020-03-06 ENCOUNTER — Encounter: Payer: Self-pay | Admitting: Physician Assistant

## 2020-03-06 ENCOUNTER — Other Ambulatory Visit: Payer: Self-pay

## 2020-03-06 ENCOUNTER — Ambulatory Visit: Payer: Medicare Other | Admitting: Physician Assistant

## 2020-03-06 VITALS — BP 130/70 | HR 72 | Ht 60.0 in | Wt 164.8 lb

## 2020-03-06 DIAGNOSIS — E782 Mixed hyperlipidemia: Secondary | ICD-10-CM | POA: Diagnosis not present

## 2020-03-06 DIAGNOSIS — I6523 Occlusion and stenosis of bilateral carotid arteries: Secondary | ICD-10-CM

## 2020-03-06 DIAGNOSIS — I25118 Atherosclerotic heart disease of native coronary artery with other forms of angina pectoris: Secondary | ICD-10-CM | POA: Diagnosis not present

## 2020-03-06 DIAGNOSIS — I10 Essential (primary) hypertension: Secondary | ICD-10-CM | POA: Diagnosis not present

## 2020-03-06 NOTE — Patient Instructions (Addendum)
Medication Instructions:  Your physician recommends that you continue on your current medications as directed. Please refer to the Current Medication list given to you today.  *If you need a refill on your cardiac medications before your next appointment, please call your pharmacy*   Lab Work: None ordered  If you have labs (blood work) drawn today and your tests are completely normal, you will receive your results only by: . MyChart Message (if you have MyChart) OR . A paper copy in the mail If you have any lab test that is abnormal or we need to change your treatment, we will call you to review the results.   Testing/Procedures: None ordered   Follow-Up: At [facility], you and your health needs are our priority.  As part of our continuing mission to provide you with exceptional heart care, we have created designated Provider Care Teams.  These Care Teams include your primary Cardiologist (physician) and Advanced Practice Providers (APPs -  Physician Assistants and Nurse Practitioners) who all work together to provide you with the care you need, when you need it.  We recommend signing up for the patient portal called "MyChart".  Sign up information is provided on this After Visit Summary.  MyChart is used to connect with patients for Virtual Visits (Telemedicine).  Patients are able to view lab/test results, encounter notes, upcoming appointments, etc.  Non-urgent messages can be sent to your provider as well.   To learn more about what you can do with MyChart, go to [email or web address].    Your next appointment:   12 month(s)  The format for your next appointment:   In Person  Provider:   You may see Katarina Nelson, MD or one of the following Advanced Practice Providers on your designated Care Team:    Dayna Dunn, PA-C  Michele Lenze, PA-C    Other Instructions  

## 2020-03-20 ENCOUNTER — Ambulatory Visit: Payer: Medicare Other | Admitting: Family Medicine

## 2020-03-27 ENCOUNTER — Telehealth: Payer: Self-pay

## 2020-03-27 NOTE — Telephone Encounter (Signed)
**Note De-Identified Chaquana Nichols Obfuscation** I started a Nexletol PA through covermymeds. Key: [id number]

## 2020-03-28 NOTE — Telephone Encounter (Signed)
**Note De-Identified Justyce Yeater Obfuscation** Message received through covermymeds: Burna Cash Key: [id number] - Rx #[id number]  Outcome  Approved on June 2  Effective from 03/27/2020 through 03/27/2021.  DrugNexletol 180MG  tablets  Form [employer] [place] Medicare Part D General Authorization Form  Original Claim [id number]   I have notified [facility] of this approval.

## 2020-04-10 ENCOUNTER — Encounter: Payer: Self-pay | Admitting: Family Medicine

## 2020-04-10 ENCOUNTER — Ambulatory Visit: Payer: Medicare Other | Admitting: Family Medicine

## 2020-04-10 ENCOUNTER — Other Ambulatory Visit: Payer: Self-pay

## 2020-04-10 VITALS — BP 138/72 | HR 105 | Ht 60.0 in | Wt 163.0 lb

## 2020-04-10 DIAGNOSIS — G8929 Other chronic pain: Secondary | ICD-10-CM

## 2020-04-10 DIAGNOSIS — M999 Biomechanical lesion, unspecified: Secondary | ICD-10-CM | POA: Diagnosis not present

## 2020-04-10 DIAGNOSIS — M5441 Lumbago with sciatica, right side: Secondary | ICD-10-CM | POA: Diagnosis not present

## 2020-04-10 NOTE — Assessment & Plan Note (Signed)
Chronic GERD doing well overall.  No significant changes in management.  Follow-up again in 2 months

## 2020-04-10 NOTE — Patient Instructions (Addendum)
Good to see you Doing great See me again in 2 months 

## 2020-04-10 NOTE — Progress Notes (Signed)
[facility] [address] [place] Phone: [phone] Subjective:   I Felicia Perry am serving as a Education administrator for Dr. Hulan Saas.  This visit occurred during the SARS-CoV-2 public health emergency.  Safety protocols were in place, including screening questions prior to the visit, additional usage of staff PPE, and extensive cleaning of exam room while observing appropriate contact time as indicated for disinfecting solutions.   I'm seeing this patient by the request  of:  Marin Olp, MD  CC: Low back and neck pain follow-up  [id number]  Felicia Perry is a 74 y.o. female coming in with complaint of back and neck pain Patient states her knees are doing well.  Patient has noticed that she is not quite losing weight but when she is doing is going down in sizes of her clothes which are noted beneficial.  Noticing increasing in energy.  Been taking the medicine on a much more regular basis.  Medications patient has been prescribed: Doing well with the Effexor and vitamin D.          Reviewed prior external information including notes and imaging from previsou exam, outside providers and external EMR if available.   As well as notes that were available from care everywhere and other healthcare systems.  Past medical history, social, surgical and family history all reviewed in electronic medical record.  No pertanent information unless stated regarding to the chief complaint.   Past Medical History:  Diagnosis Date  . Adenomatous polyp 11/23/2005  . Anxiety   . Chronic diastolic CHF (congestive heart failure) ([place])   . Coronary artery disease    a. HC on 08/14/16 showed RCA CTO s/p PCI, 60% LCX disease managed medically with recs to consider staged PCI if continued symptoms.   Marland Kitchen DIVERTICULOSIS, COLON 04/22/2007       . GERD (gastroesophageal reflux disease)   . History of shingles   . Hypertension   . Hypertensive heart  disease   . Internal hemorrhoids   . Osteoarthritis    "knees, hands, lower back" (08/14/2016)  . PUD (peptic ulcer disease)   . Sleep apnea    a. resolved with weight loss    Allergies  Allergen Reactions  . Celexa [Citalopram Hydrobromide]     psychosis  . Cephalosporins     REACTION: tongue swelling  . Clarithromycin     REACTION: blisters in mouth  . Doxycycline Hyclate     REACTION: nausea, vomiting  . Levofloxacin     REACTION: tongue swells  . Penicillins     REACTION: per patient causes rash,hives  . Rosuvastatin Other (See Comments)    Pt reports causes bilateral lower extremity muscle aches.   . Zolpidem Tartrate     REACTION: difficulty with concentration     Review of Systems:  No headache, visual changes, nausea, vomiting, diarrhea, constipation, dizziness, abdominal pain, skin rash, fevers, chills, night sweats, weight loss, swollen lymph nodes, body aches, joint swelling, chest pain, shortness of breath, mood changes. POSITIVE muscle aches  Objective  There were no vitals taken for this visit.   General: No apparent distress alert and oriented x3 mood and affect normal, dressed appropriately.  HEENT: Pupils equal, extraocular movements intact  Respiratory: Patient's speak in full sentences and does not appear short of breath  Cardiovascular: No lower extremity edema, non tender, no erythema  Neuro: Cranial nerves II through XII are intact, neurovascularly intact in all extremities with 2+ DTRs  and 2+ pulses.  Gait mild antalgic Arthritic changes of multiple joints.  Patient's knees are nontender today which is an improvement.  Patient's back does have some mild tightness of the paraspinal musculature lumbar spine but minimized overall.  Osteopathic findings  C2 flexed rotated and side bent right C6 flexed rotated and side bent left T3 extended rotated and side bent right inhaled rib T9 extended rotated and side bent left L2 flexed rotated and side bent  right Sacrum right on right       Assessment and Plan:  LOW BACK PAIN Chronic GERD doing well overall.  No significant changes in management.  Follow-up again in 2 months   Nonallopathic problems  Decision today to treat with OMT was based on Physical Exam  After verbal consent patient was treated with HVLA, ME, FPR techniques in cervical, rib, thoracic, lumbar, and sacral  areas  Patient tolerated the procedure well with improvement in symptoms  Patient given exercises, stretches and lifestyle modifications  See medications in patient instructions if given  Patient will follow up in 4-8 weeks      The above documentation has been reviewed and is accurate and complete [name], DO       Note: This dictation was prepared with Dragon dictation along with smaller phrase technology. Any transcriptional errors that result from this process are unintentional.

## 2020-04-11 ENCOUNTER — Other Ambulatory Visit: Payer: Self-pay | Admitting: Family Medicine

## 2020-04-18 ENCOUNTER — Other Ambulatory Visit: Payer: Self-pay | Admitting: Family Medicine

## 2020-04-30 ENCOUNTER — Telehealth: Payer: Self-pay | Admitting: Cardiology

## 2020-04-30 MED ORDER — NEXLIZET 180-10 MG PO TABS: 1.0000 | ORAL_TABLET | Freq: Every day | ORAL | 3 refills | Status: DC

## 2020-04-30 NOTE — Telephone Encounter (Signed)
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 Will continue with nexletol and zetia separately until PA approved.  [employer] called into pharmacy and PA sent

## 2020-04-30 NOTE — Telephone Encounter (Signed)
    Pt c/o medication issue:  1. Name of Medication:   Bempedoic Acid (NEXLETOL) 180 MG TABS    2. How are you currently taking this medication (dosage and times per day)? Take 1 tablet by mouth daily.  3. Are you having a reaction (difficulty breathing--STAT)?   4. What is your medication issue? Pt said she needs a copay card for this medication, she said she was approved for grant but they need the coupon card or copay card

## 2020-06-11 ENCOUNTER — Ambulatory Visit: Payer: Medicare Other | Admitting: Family Medicine

## 2020-06-25 DIAGNOSIS — M791 Myalgia, unspecified site: Secondary | ICD-10-CM | POA: Insufficient documentation

## 2020-06-25 DIAGNOSIS — G729 Myopathy, unspecified: Secondary | ICD-10-CM | POA: Insufficient documentation

## 2020-06-27 ENCOUNTER — Telehealth: Payer: Self-pay | Admitting: *Deleted

## 2020-06-27 ENCOUNTER — Other Ambulatory Visit: Payer: Self-pay

## 2020-06-27 ENCOUNTER — Ambulatory Visit (HOSPITAL_COMMUNITY)
Admission: RE | Admit: 2020-06-27 | Discharge: 2020-06-27 | Disposition: A | Discharge status: Home / Self Care | Payer: Medicare Other | Source: Ambulatory Visit | Attending: Cardiology | Admitting: Cardiology

## 2020-06-27 ENCOUNTER — Other Ambulatory Visit: Payer: Self-pay | Admitting: Cardiology

## 2020-06-27 DIAGNOSIS — I6529 Occlusion and stenosis of unspecified carotid artery: Secondary | ICD-10-CM | POA: Diagnosis not present

## 2020-06-27 DIAGNOSIS — I739 Peripheral vascular disease, unspecified: Secondary | ICD-10-CM | POA: Insufficient documentation

## 2020-06-27 DIAGNOSIS — I25110 Atherosclerotic heart disease of native coronary artery with unstable angina pectoris: Secondary | ICD-10-CM | POA: Insufficient documentation

## 2020-06-27 DIAGNOSIS — I6523 Occlusion and stenosis of bilateral carotid arteries: Secondary | ICD-10-CM

## 2020-06-27 NOTE — Telephone Encounter (Signed)
-----   Message from Dorothy Spark, MD sent at 06/27/2020 10:31 AM EDT ----- Right Carotid: Velocities in the right ICA are consistent with a 1-39% stenosis. Left Carotid: Velocities in the left ICA are consistent with a 40-59% stenosis. Stable findings, no intervention is needed.              Suggest follow up study in 12 months.

## 2020-06-27 NOTE — Telephone Encounter (Signed)
Pt informed of carotid US results and recommendations  per Dr. Meda Coffee.  Informed the pt that per Dr. Meda Coffee, recommends that she have this repeated in one year.  Informed the pt that I will place the order in the system and send a message to our PCC/PV schedulers, to call her back and arrange for this study to be repeated in one year.  Pt verbalized understanding and agrees with this plan.

## 2020-07-16 DIAGNOSIS — H43822 Vitreomacular adhesion, left eye: Secondary | ICD-10-CM | POA: Diagnosis not present

## 2020-07-16 LAB — HM DIABETES EYE EXAM

## 2020-07-23 ENCOUNTER — Ambulatory Visit: Payer: Medicare Other | Admitting: Family Medicine

## 2020-07-23 NOTE — Progress Notes (Deleted)
[place] Phone: [phone] Subjective:    I'm seeing this patient by the request  of:  Marin Olp, MD  CC:   [id number]  Felicia Perry is a 74 y.o. female coming in with complaint of back and neck pain Patient states   Medications patient has been prescribed:   Taking:         Reviewed prior external information including notes and imaging from previsou exam, outside providers and external EMR if available.   As well as notes that were available from care everywhere and other healthcare systems.  Past medical history, social, surgical and family history all reviewed in electronic medical record.  No pertanent information unless stated regarding to the chief complaint.   Past Medical History:  Diagnosis Date  . Adenomatous polyp 11/23/2005  . Anxiety   . Chronic diastolic CHF (congestive heart failure) ([place])   . Coronary artery disease    a. HC on 08/14/16 showed RCA CTO s/p PCI, 60% LCX disease managed medically with recs to consider staged PCI if continued symptoms.   Marland Kitchen DIVERTICULOSIS, COLON 04/22/2007       . GERD (gastroesophageal reflux disease)   . History of shingles   . Hypertension   . Hypertensive heart disease   . Internal hemorrhoids   . Osteoarthritis    "knees, hands, lower back" (08/14/2016)  . PUD (peptic ulcer disease)   . Sleep apnea    a. resolved with weight loss    Allergies  Allergen Reactions  . Celexa [Citalopram Hydrobromide]     psychosis  . Cephalosporins     REACTION: tongue swelling  . Clarithromycin     REACTION: blisters in mouth  . Doxycycline Hyclate     REACTION: nausea, vomiting  . Levofloxacin     REACTION: tongue swells  . Penicillins     REACTION: per patient causes rash,hives  . Rosuvastatin Other (See Comments)    Pt reports causes bilateral lower extremity muscle aches.   . Zolpidem Tartrate     REACTION: difficulty with  concentration     Review of Systems:  No headache, visual changes, nausea, vomiting, diarrhea, constipation, dizziness, abdominal pain, skin rash, fevers, chills, night sweats, weight loss, swollen lymph nodes, body aches, joint swelling, chest pain, shortness of breath, mood changes. POSITIVE muscle aches  Objective  There were no vitals taken for this visit.   General: No apparent distress alert and oriented x3 mood and affect normal, dressed appropriately.  HEENT: Pupils equal, extraocular movements intact  Respiratory: Patient's speak in full sentences and does not appear short of breath  Cardiovascular: No lower extremity edema, non tender, no erythema  Neuro: Cranial nerves II through XII are intact, neurovascularly intact in all extremities with 2+ DTRs and 2+ pulses.  Gait normal with good balance and coordination.  MSK:  Non tender with full range of motion and good stability and symmetric strength and tone of shoulders, elbows, wrist, hip, knee and ankles bilaterally.  Back - Normal skin, Spine with normal alignment and no deformity.  No tenderness to vertebral process palpation.  Paraspinous muscles are not tender and without spasm.   Range of motion is full at neck and lumbar sacral regions  Osteopathic findings  C2 flexed rotated and side bent right C6 flexed rotated and side bent left T3 extended rotated and side bent right inhaled rib T9 extended rotated and side bent left L2 flexed rotated  and side bent right Sacrum right on right       Assessment and Plan:    Nonallopathic problems  Decision today to treat with OMT was based on Physical Exam  After verbal consent patient was treated with HVLA, ME, FPR techniques in cervical, rib, thoracic, lumbar, and sacral  areas  Patient tolerated the procedure well with improvement in symptoms  Patient given exercises, stretches and lifestyle modifications  See medications in patient instructions if given  Patient  will follow up in 4-8 weeks      The above documentation has been reviewed and is accurate and complete [name], DO       Note: This dictation was prepared with Dragon dictation along with smaller phrase technology. Any transcriptional errors that result from this process are unintentional.

## 2020-07-29 ENCOUNTER — Other Ambulatory Visit: Payer: Self-pay | Admitting: Family Medicine

## 2020-08-06 NOTE — Patient Instructions (Addendum)
Please stop by lab before you go If you have mychart- we will send your results within 3 business days of [place] receiving them.  If you do not have mychart- we will call you about results within 5 business days of [place] receiving them.  *please note we are currently using [facility] which has a longer processing time than [place] typically so labs may not come back as quickly as in the past *please also note that you will see labs on mychart as soon as they post. I will later go in and write notes on them- will say "notes from Dr. Yong Channel"  Health Maintenance Due  Topic Date Due  . INFLUENZA VACCINE In office flu shot today high dose  covid 19 booster perhaps 2-4 weeks after flu shot.   Consider shingrix at your pharmacy 05/26/2020   discussed trial of h2 blocker like pepcid for reflux. She is willing ot try this twice a day instead of pantoprazole- if this does not work can go back to pantoprazole and let me know- ok to trial for at least a week  Schedule a medicare wellness visit with Otila Kluver our nurse  Schedule your bone density test at check out desk. You may also call directly to X-ray at [phone] to schedule an appointment that is convenient for you.  - located [address] across the street from [place] - in the basement - you do need an appointment for the bone density tests.

## 2020-08-06 NOTE — Progress Notes (Signed)
Phone [phone]   Subjective:  Patient presents today for their annual physical. Chief complaint-noted.   See problem oriented charting- ROS- full  review of systems was completed and negative except for: activity change- hip bothering her, fatigue, stable chest pain and shortness of breath, cough with asthma, visual problems- see eye doctor for left retina issues, heat intolerance, joint pain, bruises easily, anxiety, sleep issues.   The following were reviewed and entered/updated in epic: Past Medical History:  Diagnosis Date  . Adenomatous polyp 11/23/2005  . Anxiety   . Chronic diastolic CHF (congestive heart failure) ([place])   . Coronary artery disease    a. HC on 08/14/16 showed RCA CTO s/p PCI, 60% LCX disease managed medically with recs to consider staged PCI if continued symptoms.   Marland Kitchen DIVERTICULOSIS, COLON 04/22/2007       . GERD (gastroesophageal reflux disease)   . History of shingles   . Hypertension   . Hypertensive heart disease   . Internal hemorrhoids   . Osteoarthritis    "knees, hands, lower back" (08/14/2016)  . PUD (peptic ulcer disease)   . Sleep apnea    a. resolved with weight loss   Patient Active Problem List   Diagnosis Date Noted  . Carotid stenosis 01/23/2020    Priority: High  . Chronic diastolic CHF (congestive heart failure) ([place]) 01/12/2019    Priority: High  . Coronary artery disease     Priority: High  . Syncope 08/04/2016    Priority: High  . Myopathy, unspecified 06/25/2020    Priority: Medium  . Hyperlipidemia, unspecified 07/11/2018    Priority: Medium  . H/O hyperparathyroidism 08/18/2016    Priority: Medium  . Vitamin D deficiency 08/20/2015    Priority: Medium  . Chronic pain syndrome 03/26/2015    Priority: Medium  . Insomnia 07/13/2014    Priority: Medium  . GERD (gastroesophageal reflux disease) 02/25/2011    Priority: Medium  . Depression 04/22/2007    Priority: Medium  . Essential hypertension 04/22/2007     Priority: Medium  . Asthma 04/22/2007    Priority: Medium  . Senile purpura ([place]) 08/07/2020    Priority: Low  . Degenerative arthritis of knee, bilateral 12/08/2018    Priority: Low  . Partial hamstring tear, initial encounter 11/16/2018    Priority: Low  . Nonallopathic lesion of sacral region 07/05/2018    Priority: Low  . Nonallopathic lesion of cervical region 07/05/2018    Priority: Low  . DDD (degenerative disc disease), cervical 05/04/2018    Priority: Low  . Degenerative arthritis of left knee 09/15/2017    Priority: Low  . PUD (peptic ulcer disease)     Priority: Low  . Hyperglycemia 08/06/2015    Priority: Low  . Xerosis of skin 08/06/2015    Priority: Low  . Hypersomnia with sleep apnea 03/26/2015    Priority: Low  . Nonallopathic lesion of lumbosacral region 11/13/2014    Priority: Low  . Osteoarthritis of left lower extremity 10/17/2014    Priority: Low  . Chronic meniscal tear of knee 10/17/2014    Priority: Low  . Nonallopathic lesion of thoracic region 09/14/2014    Priority: Low  . Nonallopathic lesion-rib cage 08/21/2014    Priority: Low  . Tendinopathy of right rotator cuff 07/30/2014    Priority: Low  . Piriformis syndrome of right side 07/30/2014    Priority: Low  . Limited joint range of motion 07/13/2014    Priority: Low  . Rash 07/13/2014  Priority: Low  . SI (sacroiliac) joint dysfunction 10/10/2007    Priority: Low  . LOW BACK PAIN 07/01/2007    Priority: Low   Past Surgical History:  Procedure Laterality Date  . BREAST EXCISIONAL BIOPSY    . BREAST SURGERY Left 1980s   Fibrous Tumors removed   . CARDIAC CATHETERIZATION N/A 08/14/2016   Procedure: Left Heart Cath and Coronary Angiography;  Surgeon: Sherren Mocha, MD;  Location: [facility];  Service: Cardiovascular;  Laterality: N/A;  . CARDIAC CATHETERIZATION N/A 08/14/2016   Procedure: Coronary Stent Intervention;  Surgeon: Sherren Mocha, MD;  Location: [facility];  Service: Cardiovascular;  Laterality: N/A;  . CATARACT EXTRACTION W/ INTRAOCULAR LENS  IMPLANT, BILATERAL Bilateral 05/2009  . CORONARY ANGIOPLASTY    . detached retina left eye     may 2019  . TUBAL LIGATION  1970s    Family History  Problem Relation Age of Onset  . Heart disease Mother   . Hypertension Mother   . Diabetes Mother   . Dementia Mother   . COPD Father   . Dementia Maternal Grandmother   . Colon cancer Neg Hx     Medications- reviewed and updated Current Outpatient Medications  Medication Sig Dispense Refill  . acetaminophen (TYLENOL) 325 MG tablet Take 650 mg by mouth every 6 (six) hours as needed.    Marland Kitchen albuterol (VENTOLIN HFA) 108 (90 Base) MCG/ACT inhaler Inhale 2 puffs into the lungs every 6 (six) hours as needed for wheezing or shortness of breath. 18 g 5  . ALPRAZolam (XANAX) 1 MG tablet TAKE ONE TABLET AT BEDTIME AS NEEDED FOR SLEEP 90 tablet 1  . amLODipine (NORVASC) 10 MG tablet Take 1 tablet (10 mg total) by mouth daily. 90 tablet 3  . aspirin EC 81 MG tablet Take 1 tablet (81 mg total) by mouth daily. 90 tablet 3  . Bempedoic Acid-Ezetimibe (NEXLIZET) 180-10 MG TABS Take 1 tablet by mouth daily. 90 tablet 3  . budesonide-formoterol (SYMBICORT) 160-4.5 MCG/ACT inhaler Inhale 2 puffs into the lungs as needed.    . irbesartan (AVAPRO) 300 MG tablet Take 1 tablet (300 mg total) by mouth daily. 90 tablet 2  . levocetirizine (XYZAL) 5 MG tablet TAKE ONE TABLET EVERY MORNING 90 tablet 0  . Magnesium 200 MG TABS Take 1 tablet (200 mg total) by mouth daily. 30 each   . metoprolol succinate (TOPROL-XL) 50 MG 24 hr tablet Take 1 tablet (50 mg total) by mouth daily. Take with or immediately following a meal. 90 tablet 2  . omega-3 acid ethyl esters (LOVAZA) 1 g capsule Take 2 capsules (2 g total) by mouth daily. 60 capsule 11  . venlafaxine XR (EFFEXOR-XR) 75 MG 24 hr capsule TAKE ONE CAPSULE EACH DAY WITH BREAKFAST 90 capsule 0  . Vitamin D, Ergocalciferol,  (DRISDOL) 50000 units CAPS capsule Take 2,000 Units by mouth daily.     . famotidine (PEPCID) 20 MG tablet Take 1 tablet (20 mg total) by mouth 2 (two) times daily. 60 tablet 5  . nitroGLYCERIN (NITROSTAT) 0.4 MG SL tablet Place 1 tablet (0.4 mg total) under the tongue every 5 (five) minutes as needed for chest pain. 25 tablet 6   No current facility-administered medications for this visit.    Allergies-reviewed and updated Allergies  Allergen Reactions  . Celexa [Citalopram Hydrobromide]     psychosis  . Cephalosporins     REACTION: tongue swelling  . Clarithromycin     REACTION: blisters  in mouth  . Doxycycline Hyclate     REACTION: nausea, vomiting  . Levofloxacin     REACTION: tongue swells  . Penicillins     REACTION: per patient causes rash,hives  . Rosuvastatin Other (See Comments)    Pt reports causes bilateral lower extremity muscle aches.   . Zolpidem Tartrate     REACTION: difficulty with concentration    Social History   Social History Narrative   Divorced for 8 years in 2016. 2 sons- 1 son had been in prison now living with her in 2019.  2 granddaughters with 1 living close.        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 Best friend [name] patient of Dr. Yong Channel. Enjoys work, time on Teaching laboratory technician.    Objective  Objective:  BP 126/80   Pulse 82   Temp 98.3 F (36.8 C) (Temporal)   Resp 18   Ht 5' (1.524 m)   Wt 170 lb 6.4 oz (77.3 kg)   SpO2 98%   BMI 33.28 kg/m  Gen: NAD, resting comfortably HEENT: Mucous membranes are moist. Oropharynx normal. TM normal Neck: no thyromegaly CV: RRR no murmurs rubs or gallops Lungs: CTAB no crackles, wheeze, rhonchi Abdomen: soft/nontender/nondistended/normal bowel sounds. No rebound or guarding.  Ext: no edema Skin: warm, dry Neuro: grossly normal, moves all extremities, PERRLA     Assessment and Plan   74 y.o. female presenting for annual physical.  Health Maintenance counseling: 1. Anticipatory guidance: Patient counseled regarding regular dental exams -q6 months, eye exams - yearly,  avoiding smoking and second hand smoke- son smokes but in separate level of home- prefer to kick him outside! With her asthma , limiting alcohol to 1 beverage per day- doesn't drink typically .   2. Risk factor reduction:  Advised patient of need for regular exercise and diet rich and fruits and vegetables to reduce risk of heart attack and stroke. Exercise- active with work, limited by joints with walking- trying to ride her bike some to country park. Diet- 161 last physical- weights up 9 lbs. Feels inactivity has been big driver- with joints bothring her Wt Readings from Last 3 Encounters:  08/07/20 170 lb 6.4 oz (77.3 kg)  04/10/20 163 lb (73.9 kg)  03/06/20 164 lb 12.8 oz (74.8 kg)  3. Immunizations/screenings/ancillary studies-discussed flu shot and Shingrix and COVID-19 booster for [employer]. See avs- flu shot today high dose then booster 2 weeks later. She is hesitant about shingrix Immunization History  Administered Date(s) Administered  . Fluad Quad(high Dose 65+) 07/25/2019  . Influenza Split 09/21/2011, 07/07/2012  . Influenza Whole 08/10/2008, 07/30/2010  . Influenza, High Dose Seasonal PF 08/04/2016, 07/05/2018  . Influenza,inj,Quad PF,6+ Mos 07/21/2013, 08/06/2015  . Influenza-Unspecified 07/10/2014, 10/01/2017  . PFIZER SARS-COV-2 Vaccination 12/26/2019, 01/17/2020  . Pneumococcal Conjugate-13 01/05/2014  . Pneumococcal Polysaccharide-23 02/08/2015  . Td 10/26/2001  . Tetanus 01/05/2014  4. Cervical cancer screening- past age based screening recommendations-denies history of abnormal Pap smears 5. Breast cancer screening-  mammogram 09/19/2019-prefer annual repeat- she plans on this 6. Colon cancer screening - Cologuard 10/20/2018 with 3-year repeat Cologuard planned 7.  Skin cancer screening-referred to dermatology the last 2 years- she states saw 2 years ago and would like to go back- missed last year. advised regular sunscreen use. Denies worrisome, changing, or new skin lesions.  8. Birth control/STD check-postmenopausal/not sexually active.  Would use condoms if active- no unprotected sex 6. Osteoporosis screening at 60- due to high calcium is off calcium.  She is on vitamin D.  Has osteopenia.  She did not get bone density completed that was ordered in prior years-last on file 2015-discussed repeat today -Former smoker-26 pack years quit in 1980s.  No regular screening required  Status of chronic or acute concerns   #Right frontal headache at March visit-with new headache pattern over age 25-ordered MRI of the brain which was reassuring with no mass. Ended up doing better when allergies improved.   #Chronic joint pain S: Continues to follow Dr. Birdie Hopes with knees, hip, back, left side of leg.  Has had to have injections in the past. -left hip pain recently. States knees have been doing ok A/P: thankful patient has follow up with Dr. Tamala Julian.   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 Update labs below  #Vitamin D deficiency S: Medication: Vitamin D 50000 units in past now on 2000 units a day Last vitamin D Lab Results  Component Value Date   VD25OH 62.76 07/25/2019  A/P: hopefully controlled- update today   #CAD/senile purpura #hyperlipidemia/Statin myalgias #Carotid stenosis-continues to have carotid duplex check with cardiology S: Medication:Patient is compliant with aspirin but gets easy bruising.  Statin intolerant-on bepedoic acid,/zetia, lovaza  Patient was having some shortness of breath last visit as well as some chest pain-recommended cardiology follow-up.  She reports had stress test and echo in April which was  reassuring.  She does have nitroglycerin on hand. She states cardiology has thought chest tightness is anxiety related.   Last caroitd imaging "Message from Dorothy Spark, MD sent at 06/27/2020 10:31 AM EDT ----- Right Carotid: Velocities in the right ICA are consistent with a 1-39% stenosis. Left Carotid: Velocities in the left ICA are consistent with a 40-59% stenosis. Stable findings, no intervention is needed." Lab Results  Component Value Date   CHOL 100 06/22/2019   HDL 32 (L) 06/22/2019   LDLCALC Comment (A) 06/22/2019   LDLDIRECT 31 06/22/2019   TRIG 357 (H) 06/22/2019   CHOLHDL 3.1 06/22/2019   A/P: CAD stable. Continue current meds. HLD- has been controlled other than triglycerides- update today. On grant for bepedoic acid/zetia. Carotid stenosis stable  #hypertension S: medication: Metoprolol 50 mg extended release, irbesartan 300 mg, amlodipine 10 mg Stable on home checks as well BP Readings from Last 3 Encounters:  08/07/20 126/80  04/10/20 138/72  03/06/20 130/70  A/P: Stable. Continue current medications.    #Diastolic CHF-has done well without Lasix recently, requied in past for fludi overload. Echo earlier this year without diastolic dysfunction interestingly  No increased edema.  Appears stable.  Continue to monitor   # Hyperglycemia/insulin resistance/prediabetes S:  Medication: None Exercise and diet- active with work, states very hungry and eating large meals- does not snack Lab Results  Component Value Date   HGBA1C 6.3 01/23/2020   HGBA1C 6.1 07/25/2019   HGBA1C 6.0 01/12/2019   A/P: Hopefully A1c remains 6.4 last-she has preferred to remain off Metformin   # Depression #Insomnia S: Medication: Venlafaxine 75Mg , using alprazolam for sleep Depression screen [facility] 2/9 08/07/2020 01/23/2020 07/25/2019  Decreased Interest 0 0 0  Down, Depressed, Hopeless 0 1 0  PHQ - 2 Score 0 1  0  Altered sleeping 3 3 -  Tired, decreased energy 3 0 -  Change in appetite  0 0 -  Feeling bad or failure about yourself  0 0 -  Trouble concentrating 0 0 -  Moving slowly or fidgety/restless 0 0 -  Suicidal thoughts 0 0 -  PHQ-9 Score 6 4 -  Difficult doing work/chores Not difficult at all Not difficult at all -  Some recent data might be hidden  A/P: No anhedonia or depressed mood.  I would consider this reasonable control.  Her main issue have been anxiety and poor sleep.  She is still using alprazolam for sleep typically nightly- no falls  # GERD S:Medication: Pantoprozole 20Mg   B12 levels related to PPI use: Lab Results  Component Value Date   [id number] 07/25/2019  A/P:  discussed trial of h2 blocker like pepcid for reflux. She is willing ot try this twice a day instead of pantoprazole- if this does not work can go back to pantoprazole and let me know- ok to trial for at least a week  # Asthma S: Maintenance Medication: Symbicort 160-4.5- mainly winter months As needed medication: Albuterol. Patient is using this 0 per week.  A/P: doing well- continue current meds   #Seasonal allergies-reasonable control on Xyzal   Recommended follow up: Return in about 6 months (around 02/05/2021) for follow up- or sooner if needed. Future Appointments  Date Time Provider [place]  08/13/2020  3:15 PM [name], DO [facility] None  06/27/2021 10:00 AM [facility] 3 [facility]   Lab/Order associations: fasting- other than 2-3 sips of black coffee   ICD-10-CM   1. Preventative health care  Z00.00   2. Essential hypertension  I10   3. Hyperlipidemia, unspecified hyperlipidemia type  E78.5 Lipid panel    CBC With Differential/Platelet    COMPLETE METABOLIC PANEL WITH GFR    TSH  4. Coronary artery disease involving native coronary artery of native heart with unstable angina pectoris ([place])  I25.110   5. Chronic diastolic CHF (congestive heart failure) ([facility])  I50.32   6. Bilateral carotid artery stenosis  I65.23   7. Senile purpura ([facility])  D69.2    8. Vitamin D deficiency  E55.9 VITAMIN D 25 Hydroxy (Vit-D Deficiency, Fractures)  9. H/O hyperparathyroidism  Z86.39   10. Hyperglycemia  R73.9 Hemoglobin A1c  11. Depression, unspecified depression type  F32.A   12. Gastroesophageal reflux disease without esophagitis  K21.9   13. Moderate persistent asthma without complication  K02.54   14. Skin cancer screening  Z12.83 Ambulatory referral to Dermatology  15. High risk medication use  Z79.899 Vitamin B12  16. Osteopenia of neck of left femur  M85.852 DG Bone Density   Meds ordered this encounter  Medications  . famotidine (PEPCID) 20 MG tablet    Sig: Take 1 tablet (20 mg total) by mouth 2 (two) times daily.    Dispense:  60 tablet    Refill:  5  . ALPRAZolam (XANAX) 1 MG tablet    Sig: TAKE ONE TABLET AT BEDTIME AS NEEDED FOR SLEEP    Dispense:  90 tablet    Refill:  1    Return precautions advised.  Garret Reddish, MD

## 2020-08-07 ENCOUNTER — Encounter: Payer: Self-pay | Admitting: Family Medicine

## 2020-08-07 ENCOUNTER — Other Ambulatory Visit: Payer: Self-pay

## 2020-08-07 ENCOUNTER — Ambulatory Visit (INDEPENDENT_AMBULATORY_CARE_PROVIDER_SITE_OTHER): Payer: Medicare Other | Admitting: Family Medicine

## 2020-08-07 VITALS — BP 126/80 | HR 82 | Temp 98.3°F | Resp 18 | Ht 60.0 in | Wt 170.4 lb

## 2020-08-07 DIAGNOSIS — R739 Hyperglycemia, unspecified: Secondary | ICD-10-CM | POA: Diagnosis not present

## 2020-08-07 DIAGNOSIS — Z23 Encounter for immunization: Secondary | ICD-10-CM | POA: Diagnosis not present

## 2020-08-07 DIAGNOSIS — I25110 Atherosclerotic heart disease of native coronary artery with unstable angina pectoris: Secondary | ICD-10-CM | POA: Diagnosis not present

## 2020-08-07 DIAGNOSIS — J4540 Moderate persistent asthma, uncomplicated: Secondary | ICD-10-CM

## 2020-08-07 DIAGNOSIS — Z79899 Other long term (current) drug therapy: Secondary | ICD-10-CM

## 2020-08-07 DIAGNOSIS — E559 Vitamin D deficiency, unspecified: Secondary | ICD-10-CM

## 2020-08-07 DIAGNOSIS — D692 Other nonthrombocytopenic purpura: Secondary | ICD-10-CM

## 2020-08-07 DIAGNOSIS — I5032 Chronic diastolic (congestive) heart failure: Secondary | ICD-10-CM

## 2020-08-07 DIAGNOSIS — Z0000 Encounter for general adult medical examination without abnormal findings: Secondary | ICD-10-CM | POA: Diagnosis not present

## 2020-08-07 DIAGNOSIS — I6523 Occlusion and stenosis of bilateral carotid arteries: Secondary | ICD-10-CM

## 2020-08-07 DIAGNOSIS — Z1283 Encounter for screening for malignant neoplasm of skin: Secondary | ICD-10-CM

## 2020-08-07 DIAGNOSIS — F32A Depression, unspecified: Secondary | ICD-10-CM

## 2020-08-07 DIAGNOSIS — Z8639 Personal history of other endocrine, nutritional and metabolic disease: Secondary | ICD-10-CM

## 2020-08-07 DIAGNOSIS — E785 Hyperlipidemia, unspecified: Secondary | ICD-10-CM

## 2020-08-07 DIAGNOSIS — I10 Essential (primary) hypertension: Secondary | ICD-10-CM | POA: Diagnosis not present

## 2020-08-07 DIAGNOSIS — K219 Gastro-esophageal reflux disease without esophagitis: Secondary | ICD-10-CM

## 2020-08-07 DIAGNOSIS — M85852 Other specified disorders of bone density and structure, left thigh: Secondary | ICD-10-CM

## 2020-08-07 MED ORDER — FAMOTIDINE 20 MG PO TABS: 20.0000 mg | ORAL_TABLET | Freq: Two times a day (BID) | ORAL | 5 refills | Status: DC

## 2020-08-07 MED ORDER — ALPRAZOLAM 1 MG PO TABS
ORAL_TABLET | ORAL | 1 refills | Status: DC
Start: 2020-08-07 — End: 2021-02-05

## 2020-08-07 NOTE — Assessment & Plan Note (Signed)
#  Diastolic CHF-has done well without Lasix recently, requied in past for fluid overload- see notes from Dr. Meda Coffee 02/14/17. Echo earlier this year without diastolic dysfunction interestingly  No increased edema.  Appears stable.  Continue to monitor

## 2020-08-07 NOTE — Addendum Note (Signed)
Addended by: Thomes Cake on: 08/07/2020 09:35 AM   Modules accepted: Orders

## 2020-08-08 LAB — COMPLETE METABOLIC PANEL WITH GFR
AG Ratio: 1.6 (calc) (ref 1.0–2.5)
ALT: 21 U/L (ref 6–29)
AST: 23 U/L (ref 10–35)
Albumin: 4.5 g/dL (ref 3.6–5.1)
Alkaline phosphatase (APISO): 68 U/L (ref 37–153)
BUN: 23 mg/dL (ref 7–25)
CO2: 29 mmol/L (ref 20–32)
Calcium: 9.9 mg/dL (ref 8.6–10.4)
Chloride: 99 mmol/L (ref 98–110)
Creat: 0.89 mg/dL (ref 0.60–0.93)
GFR, Est African American: 74 mL/min/{1.73_m2} (ref >=60)
GFR, Est Non African American: 64 mL/min/{1.73_m2} (ref >=60)
Globulin: 2.8 g/dL (calc) (ref 1.9–3.7)
Glucose, Bld: 141 mg/dL — ABNORMAL HIGH (ref 65–99)
Potassium: 4.7 mmol/L (ref 3.5–5.3)
Sodium: 138 mmol/L (ref 135–146)
Total Bilirubin: 0.8 mg/dL (ref 0.2–1.2)
Total Protein: 7.3 g/dL (ref 6.1–8.1)

## 2020-08-08 LAB — CBC WITH DIFFERENTIAL/PLATELET
Absolute Monocytes: 872 cells/uL (ref 200–950)
Basophils Absolute: 158 cells/uL (ref 0–200)
Basophils Relative: 1.9 %
Eosinophils Absolute: 274 cells/uL (ref 15–500)
Eosinophils Relative: 3.3 %
HCT: 41.3 % (ref 35.0–45.0)
Hemoglobin: 14.2 g/dL (ref 11.7–15.5)
Lymphs Abs: 2283 cells/uL (ref 850–3900)
MCH: 30.5 pg (ref 27.0–33.0)
MCHC: 34.4 g/dL (ref 32.0–36.0)
MCV: 88.8 fL (ref 80.0–100.0)
MPV: 10.6 fL (ref 7.5–12.5)
Monocytes Relative: 10.5 %
Neutro Abs: 4714 cells/uL (ref 1500–7800)
Neutrophils Relative %: 56.8 %
Platelets: 219 10*3/uL (ref 140–400)
RBC: 4.65 10*6/uL (ref 3.80–5.10)
RDW: 12.1 % (ref 11.0–15.0)
Total Lymphocyte: 27.5 %
WBC: 8.3 10*3/uL (ref 3.8–10.8)

## 2020-08-08 LAB — VITAMIN B12: Vitamin B-12: 937 pg/mL (ref 200–1100)

## 2020-08-08 LAB — VITAMIN D 25 HYDROXY (VIT D DEFICIENCY, FRACTURES): Vit D, 25-Hydroxy: 61 ng/mL (ref 30–100)

## 2020-08-08 LAB — LIPID PANEL
Cholesterol: 112 mg/dL (ref <200)
HDL: 30 mg/dL — ABNORMAL LOW (ref >=50)
LDL Cholesterol (Calc): 48 mg/dL (calc)
Non-HDL Cholesterol (Calc): 82 mg/dL (calc) (ref <130)
Total CHOL/HDL Ratio: 3.7 (calc) (ref <5.0)
Triglycerides: 320 mg/dL — ABNORMAL HIGH (ref <150)

## 2020-08-08 LAB — HEMOGLOBIN A1C
Hgb A1c MFr Bld: 6.7 % of total Hgb — ABNORMAL HIGH (ref <5.7)
Mean Plasma Glucose: 146 (calc)
eAG (mmol/L): 8.1 (calc)

## 2020-08-08 LAB — TSH: TSH: 1.38 mIU/L (ref 0.40–4.50)

## 2020-08-09 ENCOUNTER — Encounter: Payer: Self-pay | Admitting: Family Medicine

## 2020-08-13 ENCOUNTER — Other Ambulatory Visit: Payer: Self-pay

## 2020-08-13 ENCOUNTER — Encounter: Payer: Self-pay | Admitting: Family Medicine

## 2020-08-13 ENCOUNTER — Ambulatory Visit: Payer: Medicare Other | Admitting: Family Medicine

## 2020-08-13 VITALS — BP 120/82 | HR 78 | Ht 60.0 in | Wt 170.0 lb

## 2020-08-13 DIAGNOSIS — G8929 Other chronic pain: Secondary | ICD-10-CM

## 2020-08-13 DIAGNOSIS — M999 Biomechanical lesion, unspecified: Secondary | ICD-10-CM | POA: Diagnosis not present

## 2020-08-13 DIAGNOSIS — M5441 Lumbago with sciatica, right side: Secondary | ICD-10-CM | POA: Diagnosis not present

## 2020-08-13 DIAGNOSIS — M7062 Trochanteric bursitis, left hip: Secondary | ICD-10-CM

## 2020-08-13 NOTE — Assessment & Plan Note (Signed)
Patient has done remarkably well with a low back pain.  We discussed posture and ergonomics.  Patient continues to stay very active.  Continue the Effexor for this as well as her other problems.  Patient does respond well to manipulation and will see me again in 56-month intervals.

## 2020-08-13 NOTE — Progress Notes (Signed)
[facility] [address] [place] Phone: [phone] Subjective:   I Felicia Perry am serving as a Education administrator for Dr. Hulan Saas.  This visit occurred during the SARS-CoV-2 public health emergency.  Safety protocols were in place, including screening questions prior to the visit, additional usage of staff PPE, and extensive cleaning of exam room while observing appropriate contact time as indicated for disinfecting solutions.   I'm seeing this patient by the request  of:  Marin Olp, MD  CC:   [id number]  Felicia Perry is a 73 y.o. female coming in with complaint of back pain. OMT 04/10/2020. Patient states her knees are doing well. States she has done something to her left hip about 3 weeks ago. Remembers cleaning a big house and has had pain since. States overall she is doing well.      Past Medical History:  Diagnosis Date   Adenomatous polyp 11/23/2005   Anxiety    Chronic diastolic CHF (congestive heart failure) (HCC)    Coronary artery disease    a. HC on 08/14/16 showed RCA CTO s/p PCI, 60% LCX disease managed medically with recs to consider staged PCI if continued symptoms.    DIVERTICULOSIS, COLON 04/22/2007        GERD (gastroesophageal reflux disease)    History of shingles    Hypertension    Hypertensive heart disease    Internal hemorrhoids    Osteoarthritis    "knees, hands, lower back" (08/14/2016)   PUD (peptic ulcer disease)    Sleep apnea    a. resolved with weight loss   Past Surgical History:  Procedure Laterality Date   BREAST EXCISIONAL BIOPSY     BREAST SURGERY Left 1980s   Fibrous Tumors removed    CARDIAC CATHETERIZATION N/A 08/14/2016   Procedure: Left Heart Cath and Coronary Angiography;  Surgeon: Sherren Mocha, MD;  Location: [facility];  Service: Cardiovascular;  Laterality: N/A;   CARDIAC CATHETERIZATION N/A 08/14/2016   Procedure: Coronary Stent Intervention;   Surgeon: Sherren Mocha, MD;  Location: [facility];  Service: Cardiovascular;  Laterality: N/A;   CATARACT EXTRACTION W/ INTRAOCULAR LENS  IMPLANT, BILATERAL Bilateral 05/2009   CORONARY ANGIOPLASTY     detached retina left eye     may 2019   TUBAL LIGATION  1970s   Social History   Socioeconomic History   Marital status: Divorced    Spouse name: Not on file   Number of children: Not on file   Years of education: Not on file   Highest education level: Not on file  Occupational History   Occupation: Cleaning  Tobacco Use   Smoking status: Former Smoker    Packs/day: 1.00    Years: 26.00    Pack years: 26.00    Types: Cigarettes    Quit date: 1989    Years since quitting: 32.8   Smokeless tobacco: Never Used  Substance and Sexual Activity   Alcohol use: No   Drug use: No   Sexual activity: Not Currently  Other Topics Concern   Not on file  Social History Narrative   Divorced for 32 years in 2016. 2 sons- 1 son had been in prison now living with her in 2019.  2 granddaughters with 1 living close.        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 Best friend [name] patient of Dr. Yong Channel. Enjoys work, time on Teaching laboratory technician.    Social Determinants of Health   Financial Resource Strain:    Difficulty of Paying Living Expenses: Not on file  Food Insecurity:    Worried About Charity fundraiser in the Last Year: Not on file   [employer] of Food in the Last Year: Not on file  Transportation Needs:    Lack of Transportation (Medical): Not on file   Lack of Transportation (Non-Medical): Not on file  Physical Activity:    Days of Exercise per Week: Not on file   Minutes of Exercise per Session: Not on file  Stress:    Feeling of Stress : Not on file  Social Connections:    Frequency of Communication with Friends and  Family: Not on file   Frequency of Social Gatherings with Friends and Family: Not on file   Attends Religious Services: Not on file   Active Member of Clubs or Organizations: Not on file   Attends Archivist Meetings: Not on file   Marital Status: Not on file   Allergies  Allergen Reactions   Celexa [Citalopram Hydrobromide]     psychosis   Cephalosporins     REACTION: tongue swelling   Clarithromycin     REACTION: blisters in mouth   Doxycycline Hyclate     REACTION: nausea, vomiting   Levofloxacin     REACTION: tongue swells   Penicillins     REACTION: per patient causes rash,hives   Rosuvastatin Other (See Comments)    Pt reports causes bilateral lower extremity muscle aches.    Zolpidem Tartrate     REACTION: difficulty with concentration   Family History  Problem Relation Age of Onset   Heart disease Mother    Hypertension Mother    Diabetes Mother    Dementia Mother    COPD Father    Dementia Maternal Grandmother    Colon cancer Neg Hx      Current Outpatient Medications (Cardiovascular):    amLODipine (NORVASC) 10 MG tablet, Take 1 tablet (10 mg total) by mouth daily.   Bempedoic Acid-Ezetimibe (NEXLIZET) 180-10 MG TABS, Take 1 tablet by mouth daily.   irbesartan (AVAPRO) 300 MG tablet, Take 1 tablet (300 mg total) by mouth daily.   metoprolol succinate (TOPROL-XL) 50 MG 24 hr tablet, Take 1 tablet (50 mg total) by mouth daily. Take with or immediately following a meal.   omega-3 acid ethyl esters (LOVAZA) 1 g capsule, Take 2 capsules (2 g total) by mouth daily.   nitroGLYCERIN (NITROSTAT) 0.4 MG SL tablet, Place 1 tablet (0.4 mg total) under the tongue every 5 (five) minutes as needed for chest pain.  Current Outpatient Medications (Respiratory):    albuterol (VENTOLIN HFA) 108 (90 Base) MCG/ACT inhaler, Inhale 2 puffs into the lungs every 6 (six) hours as needed for wheezing or shortness of breath.   budesonide-formoterol  (SYMBICORT) 160-4.5 MCG/ACT inhaler, Inhale 2 puffs into the lungs as needed.   levocetirizine (XYZAL) 5 MG tablet, TAKE ONE TABLET EVERY MORNING  Current Outpatient Medications (Analgesics):    acetaminophen (TYLENOL) 325 MG tablet, Take 650 mg by mouth every 6 (six) hours as needed.   aspirin EC 81 MG tablet, Take 1 tablet (81 mg total) by mouth daily.   Current Outpatient Medications (Other):    ALPRAZolam (XANAX) 1 MG tablet, TAKE ONE TABLET AT BEDTIME AS NEEDED FOR  SLEEP   famotidine (PEPCID) 20 MG tablet, Take 1 tablet (20 mg total) by mouth 2 (two) times daily.   Magnesium 200 MG TABS, Take 1 tablet (200 mg total) by mouth daily.   venlafaxine XR (EFFEXOR-XR) 75 MG 24 hr capsule, TAKE ONE CAPSULE EACH DAY WITH BREAKFAST   Vitamin D, Ergocalciferol, (DRISDOL) 50000 units CAPS capsule, Take 2,000 Units by mouth daily.    Reviewed prior external information including notes and imaging from  primary care provider As well as notes that were available from care everywhere and other healthcare systems.  Past medical history, social, surgical and family history all reviewed in electronic medical record.  No pertanent information unless stated regarding to the chief complaint.   Review of Systems:  No headache, visual changes, nausea, vomiting, diarrhea, constipation, dizziness, abdominal pain, skin rash, fevers, chills, night sweats, weight loss, swollen lymph nodes, body aches, joint swelling, chest pain, shortness of breath, mood changes. POSITIVE muscle aches  Objective  Blood pressure 120/82, pulse 78, height 5' (1.524 m), weight 170 lb (77.1 kg), SpO2 98 %.   General: No apparent distress alert and oriented x3 mood and affect normal, dressed appropriately.  HEENT: Pupils equal, extraocular movements intact  Respiratory: Patient's speak in full sentences and does not appear short of breath  Cardiovascular: No lower extremity edema, non tender, no erythema  Gait normal with  good balance and coordination.  MSK moderate arthritic changes of multiple joints.  Knee seem to be doing relatively well.  Some increasing tenderness to palpation over the left greater trochanteric area.  Mild positive Corky Sox.  Negative straight leg test.  Still has tightness of the right hamstring from patient's previous hamstring injury.  In the lower back some tenderness to palpation diffusely.  Near full range of motion.  Osteopathic findings  C4 flexed rotated and side bent left C7 flexed rotated and side bent left T7 extended rotated and side bent right inhaled third rib L2 flexed rotated and side bent right Sacrum right on right     Impression and Recommendations:     The above documentation has been reviewed and is accurate and complete [name], DO

## 2020-08-13 NOTE — Assessment & Plan Note (Signed)
Noted today on the exam.  Patient though did not want any type of injection.  We will continue to monitor.  If worsening pain patient can call and we will consider the injections if needed.

## 2020-08-13 NOTE — Assessment & Plan Note (Signed)
   Decision today to treat with OMT was based on Physical Exam  After verbal consent patient was treated with  ME, FPR techniques in cervical, thoracic, rib, lumbar and sacral areas, all areas are chronic   Patient tolerated the procedure well with improvement in symptoms  Patient given exercises, stretches and lifestyle modifications  See medications in patient instructions if given  Patient will follow up in 4-8 weeks 

## 2020-08-13 NOTE — Patient Instructions (Signed)
GT exercises Ice after activity and before bed Glad you are doing great See me again in 3 months

## 2020-08-14 ENCOUNTER — Other Ambulatory Visit: Payer: Self-pay | Admitting: Cardiology

## 2020-08-14 DIAGNOSIS — I25110 Atherosclerotic heart disease of native coronary artery with unstable angina pectoris: Secondary | ICD-10-CM

## 2020-08-14 DIAGNOSIS — Z789 Other specified health status: Secondary | ICD-10-CM

## 2020-08-14 DIAGNOSIS — I10 Essential (primary) hypertension: Secondary | ICD-10-CM

## 2020-08-14 DIAGNOSIS — E782 Mixed hyperlipidemia: Secondary | ICD-10-CM

## 2020-08-16 ENCOUNTER — Other Ambulatory Visit: Payer: Self-pay

## 2020-08-16 ENCOUNTER — Ambulatory Visit (INDEPENDENT_AMBULATORY_CARE_PROVIDER_SITE_OTHER): Payer: Medicare Other

## 2020-08-16 VITALS — BP 120/80 | HR 91 | Temp 97.6°F | Resp 20 | Wt 169.2 lb

## 2020-08-16 DIAGNOSIS — Z0000 Encounter for general adult medical examination without abnormal findings: Secondary | ICD-10-CM

## 2020-08-16 NOTE — Patient Instructions (Addendum)
Felicia Perry , Thank you for taking time to come for your Medicare Wellness Visit. I appreciate your ongoing commitment to your health goals. Please review the following plan we discussed and let me know if I can assist you in the future.   Screening recommendations/referrals: Colonoscopy: Done 05/18/18 Mammogram: Done 09/19/19 Bone Density: Done 09/25/14 Recommended yearly ophthalmology/optometry visit for glaucoma screening and checkup Recommended yearly dental visit for hygiene and checkup  Vaccinations: Influenza vaccine: Up to date Pneumococcal vaccine: Up to date Tdap vaccine: Up to date Shingles vaccine: Shingrix discussed. Please contact your pharmacy for coverage information.    Covid-19:Completed 3/2 & 01/17/20  Advanced directives: Advance directive discussed with you today. I have provided a copy for you to complete at home and have notarized. Once this is complete please bring a copy in to our office so we can scan it into your chart.  Conditions/risks identified: Lose 20lbs  Next appointment: Follow up in one year for your annual wellness visit   Preventive Care 65 Years and Older, Female Preventive care refers to lifestyle choices and visits with your health care provider that can promote health and wellness. What does preventive care include?  A yearly physical exam. This is also called an annual well check.  Dental exams once or twice a year.  Routine eye exams. Ask your health care provider how often you should have your eyes checked.  Personal lifestyle choices, including:  Daily care of your teeth and gums.  Regular physical activity.  Eating a healthy diet.  Avoiding tobacco and drug use.  Limiting alcohol use.  Practicing safe sex.  Taking low-dose aspirin every day.  Taking vitamin and mineral supplements as recommended by your health care provider. What happens during an annual well check? The services and screenings done by your health care  provider during your annual well check will depend on your age, overall health, lifestyle risk factors, and family history of disease. Counseling  Your health care provider may ask you questions about your:  Alcohol use.  Tobacco use.  Drug use.  Emotional well-being.  Home and relationship well-being.  Sexual activity.  Eating habits.  History of falls.  Memory and ability to understand (cognition).  Work and work Statistician.  Reproductive health. Screening  You may have the following tests or measurements:  Height, weight, and BMI.  Blood pressure.  Lipid and cholesterol levels. These may be checked every 5 years, or more frequently if you are over 38 years old.  Skin check.  Lung cancer screening. You may have this screening every year starting at age 60 if you have a 30-pack-year history of smoking and currently smoke or have quit within the past 15 years.  Fecal occult blood test (FOBT) of the stool. You may have this test every year starting at age 53.  Flexible sigmoidoscopy or colonoscopy. You may have a sigmoidoscopy every 5 years or a colonoscopy every 10 years starting at age 54.  Hepatitis C blood test.  Hepatitis B blood test.  Sexually transmitted disease (STD) testing.  Diabetes screening. This is done by checking your blood sugar (glucose) after you have not eaten for a while (fasting). You may have this done every 1-3 years.  Bone density scan. This is done to screen for osteoporosis. You may have this done starting at age 20.  Mammogram. This may be done every 1-2 years. Talk to your health care provider about how often you should have regular mammograms. Talk with your health  care provider about your test results, treatment options, and if necessary, the need for more tests. Vaccines  Your health care provider may recommend certain vaccines, such as:  Influenza vaccine. This is recommended every year.  Tetanus, diphtheria, and acellular  pertussis (Tdap, Td) vaccine. You may need a Td booster every 10 years.  Zoster vaccine. You may need this after age 75.  Pneumococcal 13-valent conjugate (PCV13) vaccine. One dose is recommended after age 17.  Pneumococcal polysaccharide (PPSV23) vaccine. One dose is recommended after age 51. Talk to your health care provider about which screenings and vaccines you need and how often you need them. This information is not intended to replace advice given to you by your health care provider. Make sure you discuss any questions you have with your health care provider. Document Released: 11/08/2015 Document Revised: 07/01/2016 Document Reviewed: 08/13/2015 Elsevier Interactive Patient Education  2017 [place] Prevention in the Home Falls can cause injuries. They can happen to people of all ages. There are many things you can do to make your home safe and to help prevent falls. What can I do on the outside of my home?  Regularly fix the edges of walkways and driveways and fix any cracks.  Remove anything that might make you trip as you walk through a door, such as a raised step or threshold.  Trim any bushes or trees on the path to your home.  Use bright outdoor lighting.  Clear any walking paths of anything that might make someone trip, such as rocks or tools.  Regularly check to see if handrails are loose or broken. Make sure that both sides of any steps have handrails.  Any raised decks and porches should have guardrails on the edges.  Have any leaves, snow, or ice cleared regularly.  Use sand or salt on walking paths during winter.  Clean up any spills in your garage right away. This includes oil or grease spills. What can I do in the bathroom?  Use night lights.  Install grab bars by the toilet and in the tub and shower. Do not use towel bars as grab bars.  Use non-skid mats or decals in the tub or shower.  If you need to sit down in the shower, use a plastic,  non-slip stool.  Keep the floor dry. Clean up any water that spills on the floor as soon as it happens.  Remove soap buildup in the tub or shower regularly.  Attach bath mats securely with double-sided non-slip rug tape.  Do not have throw rugs and other things on the floor that can make you trip. What can I do in the bedroom?  Use night lights.  Make sure that you have a light by your bed that is easy to reach.  Do not use any sheets or blankets that are too big for your bed. They should not hang down onto the floor.  Have a firm chair that has side arms. You can use this for support while you get dressed.  Do not have throw rugs and other things on the floor that can make you trip. What can I do in the kitchen?  Clean up any spills right away.  Avoid walking on wet floors.  Keep items that you use a lot in easy-to-reach places.  If you need to reach something above you, use a strong step stool that has a grab bar.  Keep electrical cords out of the way.  Do not use floor  polish or wax that makes floors slippery. If you must use wax, use non-skid floor wax.  Do not have throw rugs and other things on the floor that can make you trip. What can I do with my stairs?  Do not leave any items on the stairs.  Make sure that there are handrails on both sides of the stairs and use them. Fix handrails that are broken or loose. Make sure that handrails are as long as the stairways.  Check any carpeting to make sure that it is firmly attached to the stairs. Fix any carpet that is loose or worn.  Avoid having throw rugs at the top or bottom of the stairs. If you do have throw rugs, attach them to the floor with carpet tape.  Make sure that you have a light switch at the top of the stairs and the bottom of the stairs. If you do not have them, ask someone to add them for you. What else can I do to help prevent falls?  Wear shoes that:  Do not have high heels.  Have rubber  bottoms.  Are comfortable and fit you well.  Are closed at the toe. Do not wear sandals.  If you use a stepladder:  Make sure that it is fully opened. Do not climb a closed stepladder.  Make sure that both sides of the stepladder are locked into place.  Ask someone to hold it for you, if possible.  Clearly mark and make sure that you can see:  Any grab bars or handrails.  First and last steps.  Where the edge of each step is.  Use tools that help you move around (mobility aids) if they are needed. These include:  Canes.  Walkers.  Scooters.  Crutches.  Turn on the lights when you go into a dark area. Replace any light bulbs as soon as they burn out.  Set up your furniture so you have a clear path. Avoid moving your furniture around.  If any of your floors are uneven, fix them.  If there are any pets around you, be aware of where they are.  Review your medicines with your doctor. Some medicines can make you feel dizzy. This can increase your chance of falling. Ask your doctor what other things that you can do to help prevent falls. This information is not intended to replace advice given to you by your health care provider. Make sure you discuss any questions you have with your health care provider. Document Released: 08/08/2009 Document Revised: 03/19/2016 Document Reviewed: 11/16/2014 Elsevier Interactive Patient Education  2017 [employer].

## 2020-08-16 NOTE — Progress Notes (Signed)
Subjective:   Felicia Perry is a 74 y.o. female who presents for Medicare Annual (Subsequent) preventive examination.  Review of Systems     Cardiac Risk Factors include: advanced age (>20men, >68 women);obesity (BMI >30kg/m2);hypertension;dyslipidemia     Objective:    Today's Vitals   08/16/20 0842  BP: 120/80  Pulse: 91  Resp: 20  Temp: 97.6 F (36.4 C)  Weight: 169 lb 3.2 oz (76.7 kg)  PainSc: 4    Body mass index is 33.04 kg/m.  Advanced Directives 08/16/2020 07/25/2019 04/21/2018 11/17/2016 08/14/2016 08/14/2016 03/07/2015  Does Patient Have a Medical Advance Directive? Yes No Yes No Yes Yes No  Type of Advance Directive - - - - - Living will;Healthcare Power of Attorney -  Does patient want to make changes to medical advance directive? Yes (MAU/Ambulatory/Procedural Areas - Information given) Yes (MAU/Ambulatory/Procedural Areas - Information given) - - No - Patient declined - -  Copy of [place] in Chart? - - - - No - copy requested No - copy requested -  Would patient like information on creating a medical advance directive? - - - - - - Yes - Educational materials given    Current Medications (verified) Outpatient Encounter Medications as of 08/16/2020  Medication Sig  . acetaminophen (TYLENOL) 325 MG tablet Take 650 mg by mouth every 6 (six) hours as needed.  Marland Kitchen albuterol (VENTOLIN HFA) 108 (90 Base) MCG/ACT inhaler Inhale 2 puffs into the lungs every 6 (six) hours as needed for wheezing or shortness of breath.  . ALPRAZolam (XANAX) 1 MG tablet TAKE ONE TABLET AT BEDTIME AS NEEDED FOR SLEEP  . amLODipine (NORVASC) 10 MG tablet Take 1 tablet (10 mg total) by mouth daily.  Marland Kitchen aspirin EC 81 MG tablet Take 1 tablet (81 mg total) by mouth daily.  . Bempedoic Acid-Ezetimibe (NEXLIZET) 180-10 MG TABS Take 1 tablet by mouth daily.  . budesonide-formoterol (SYMBICORT) 160-4.5 MCG/ACT inhaler Inhale 2 puffs into the lungs as needed.  . famotidine (PEPCID)  20 MG tablet Take 1 tablet (20 mg total) by mouth 2 (two) times daily.  . irbesartan (AVAPRO) 300 MG tablet TAKE ONE TABLET DAILY  . levocetirizine (XYZAL) 5 MG tablet TAKE ONE TABLET EVERY MORNING  . Magnesium 200 MG TABS Take 1 tablet (200 mg total) by mouth daily.  . metoprolol succinate (TOPROL-XL) 50 MG 24 hr tablet TAKE ONE TABLET DAILY WITH OR IMMEDIATELY FOLLOWING A MEAL  . omega-3 acid ethyl esters (LOVAZA) 1 g capsule Take 2 capsules (2 g total) by mouth daily.  Marland Kitchen venlafaxine XR (EFFEXOR-XR) 75 MG 24 hr capsule TAKE ONE CAPSULE EACH DAY WITH BREAKFAST  . Vitamin D, Ergocalciferol, (DRISDOL) 50000 units CAPS capsule Take 2,000 Units by mouth daily.   . nitroGLYCERIN (NITROSTAT) 0.4 MG SL tablet Place 1 tablet (0.4 mg total) under the tongue every 5 (five) minutes as needed for chest pain.   No facility-administered encounter medications on file as of 08/16/2020.    Allergies (verified) Celexa [citalopram hydrobromide], Cephalosporins, Clarithromycin, Doxycycline hyclate, Levofloxacin, Penicillins, Rosuvastatin, and Zolpidem tartrate   History: Past Medical History:  Diagnosis Date  . Adenomatous polyp 11/23/2005  . Anxiety   . Chronic diastolic CHF (congestive heart failure) ([place])   . Coronary artery disease    a. HC on 08/14/16 showed RCA CTO s/p PCI, 60% LCX disease managed medically with recs to consider staged PCI if continued symptoms.   Marland Kitchen DIVERTICULOSIS, COLON 04/22/2007       .  GERD (gastroesophageal reflux disease)   . History of shingles   . Hypertension   . Hypertensive heart disease   . Internal hemorrhoids   . Osteoarthritis    "knees, hands, lower back" (08/14/2016)  . PUD (peptic ulcer disease)   . Sleep apnea    a. resolved with weight loss   Past Surgical History:  Procedure Laterality Date  . BREAST EXCISIONAL BIOPSY    . BREAST SURGERY Left 1980s   Fibrous Tumors removed   . CARDIAC CATHETERIZATION N/A 08/14/2016   Procedure: Left Heart Cath and  Coronary Angiography;  Surgeon: Sherren Mocha, MD;  Location: [facility];  Service: Cardiovascular;  Laterality: N/A;  . CARDIAC CATHETERIZATION N/A 08/14/2016   Procedure: Coronary Stent Intervention;  Surgeon: Sherren Mocha, MD;  Location: [facility];  Service: Cardiovascular;  Laterality: N/A;  . CATARACT EXTRACTION W/ INTRAOCULAR LENS  IMPLANT, BILATERAL Bilateral 05/2009  . CORONARY ANGIOPLASTY    . detached retina left eye     may 2019  . TUBAL LIGATION  1970s   Family History  Problem Relation Age of Onset  . Heart disease Mother   . Hypertension Mother   . Diabetes Mother   . Dementia Mother   . COPD Father   . Dementia Maternal Grandmother   . Colon cancer Neg Hx    Social History   Socioeconomic History  . Marital status: Divorced    Spouse name: Not on file  . Number of children: Not on file  . Years of education: Not on file  . Highest education level: Not on file  Occupational History  . Occupation: Cleaning  Tobacco Use  . Smoking status: Former Smoker    Packs/day: 1.00    Years: 26.00    Pack years: 26.00    Types: Cigarettes    Quit date: 1989    Years since quitting: 32.8  . Smokeless tobacco: Never Used  Substance and Sexual Activity  . Alcohol use: No  . Drug use: No  . Sexual activity: Not Currently  Other Topics Concern  . Not on file  Social History Narrative   Divorced for 33 years in 2016. 2 sons- 1 son had been in prison now living with her in 2019.  2 granddaughters with 1 living close.        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 Best friend [name] patient of Dr. Yong Channel. Enjoys work, time on Teaching laboratory technician.    Social Determinants of Health   Financial Resource Strain: Low Risk   . Difficulty of Paying Living Expenses: Not hard at all  Food Insecurity: No Food Insecurity  . Worried  About Charity fundraiser in the Last Year: Never true  . Ran Out of Food in the Last Year: Never true  Transportation Needs: No Transportation Needs  . Lack of Transportation (Medical): No  . Lack of Transportation (Non-Medical): No  Physical Activity: Sufficiently Active  . Days of Exercise per Week: 5 days  . Minutes of Exercise per Session: 60 min  Stress: No Stress Concern Present  . Feeling of Stress : Not at all  Social Connections: Moderately Isolated  . Frequency of Communication with Friends and Family: More than three times a week  . Frequency of Social Gatherings with Friends and Family: Three times a week  .  Attends Religious Services: More than 4 times per year  . Active Member of Clubs or Organizations: No  . Attends Archivist Meetings: Never  . Marital Status: Divorced    Tobacco Counseling Counseling given: Not Answered   Clinical Intake:  Pre-visit preparation completed: Yes  Pain : 0-10 Pain Score: 4  (left hip/ pt states bursitis) Pain Type: Chronic pain Pain Location: Hip (left) Pain Orientation: Left Pain Descriptors / Indicators: Dull Pain Onset: 1 to 4 weeks ago Pain Frequency: Intermittent     BMI - recorded: 33.04 Nutritional Status: BMI > 30  Obese Nutritional Risks: None Diabetes: No  How often do you need to have someone help you when you read instructions, pamphlets, or other written materials from your doctor or pharmacy?: 1 - Never  Diabetic?No  Interpreter Needed?: No  Information entered by :: Charlott Rakes, LPN   Activities of Daily Living In your present state of health, do you have any difficulty performing the following activities: 08/16/2020 08/07/2020  Hearing? N N  Vision? N N  Difficulty concentrating or making decisions? N N  Walking or climbing stairs? N N  Dressing or bathing? N N  Doing errands, shopping? N N  Preparing Food and eating ? N -  Using the Toilet? N -  In the past six months, have you  accidently leaked urine? N -  Do you have problems with loss of bowel control? N -  Managing your Medications? N -  Managing your Finances? N -  Housekeeping or managing your Housekeeping? N -  Some recent data might be hidden    Patient Care Team: Marin Olp, MD as PCP - General (Family Medicine) Dorothy Spark, MD as PCP - Cardiology (Cardiology) [name], DO as Consulting Physician (Sports Medicine)  Indicate any recent Medical Services you may have received from other than [facility] providers in the past year (date may be approximate).     Assessment:   This is a routine wellness examination for [employer].  Hearing/Vision screen  Hearing Screening   125Hz  250Hz  500Hz  1000Hz  2000Hz  3000Hz  4000Hz  6000Hz  8000Hz   Right ear:           Left ear:           Comments: Pt  denies hearing difficulty at this time  Vision Screening Comments: Follows up with Dr Alanda Slim at [facility] annually  Dietary issues and exercise activities discussed: Current Exercise Habits: Home exercise routine, Type of exercise: walking (riding a bike), Time (Minutes): > 60, Frequency (Times/Week): 5, Weekly Exercise (Minutes/Week): 0, Exercise limited by: orthopedic condition(s)  Goals    . Patient Stated     Will work another year Take time off and plan to take a week off q 4 months     . Patient Stated     Lose 20lbs       Depression Screen PHQ 2/9 Scores 08/16/2020 08/07/2020 01/23/2020 07/25/2019 07/25/2019 07/25/2019 07/25/2019  PHQ - 2 Score 0 0 1 0 0 0 0  PHQ- 9 Score - 6 4 - 2 0 -    Fall Risk Fall Risk  08/16/2020 08/07/2020 07/25/2019 04/21/2018 04/21/2018  Falls in the past year? 0 0 0 No No  Number falls in past yr: 0 0 0 - -  Injury with Fall? 0 0 0 - -  Risk for fall due to : Impaired vision - - - -  Follow up Falls prevention discussed - Education provided;Falls prevention discussed;Falls evaluation  completed - -    Any stairs in or around the home? Yes  If so, are  there any without handrails? No  Home free of loose throw rugs in walkways, pet beds, electrical cords, etc? Yes  Adequate lighting in your home to reduce risk of falls? Yes   ASSISTIVE DEVICES UTILIZED TO PREVENT FALLS:  Life alert? No  Use of a cane, walker or w/c? No  Grab bars in the bathroom? Yes  Shower chair or bench in shower? No  Elevated toilet seat or a handicapped toilet? No   TIMED UP AND GO:  Was the test performed? Yes .  Length of time to ambulate 10 feet: 10 sec.   Gait steady and fast without use of assistive device  Cognitive Function: MMSE - Mini Mental State Exam 07/25/2019 04/21/2018 04/21/2018  Not completed: - (No Data) (No Data)  Orientation to time 5 - -  Orientation to Place 5 - -  Registration 3 - -  Attention/ Calculation 5 - -  Recall 3 - -  Language- name 2 objects 2 - -  Language- repeat 1 - -  Language- follow 3 step command 3 - -  Language- read & follow direction 1 - -  Write a sentence 1 - -  Copy design 1 - -  Total score 30 - -     6CIT Screen 08/16/2020  What Year? 0 points  What month? 0 points  Count back from 20 0 points  Months in reverse 0 points  Repeat phrase 0 points    Immunizations Immunization History  Administered Date(s) Administered  . Fluad Quad(high Dose 65+) 07/25/2019, 08/07/2020  . Influenza Split 09/21/2011, 07/07/2012  . Influenza Whole 08/10/2008, 07/30/2010  . Influenza, High Dose Seasonal PF 08/04/2016, 07/05/2018  . Influenza,inj,Quad PF,6+ Mos 07/21/2013, 08/06/2015  . Influenza-Unspecified 07/10/2014, 10/01/2017  . PFIZER SARS-COV-2 Vaccination 12/26/2019, 01/17/2020  . Pneumococcal Conjugate-13 01/05/2014  . Pneumococcal Polysaccharide-23 02/08/2015  . Td 10/26/2001  . Tetanus 01/05/2014    TDAP status: Up to date Flu Vaccine status: Up to date Pneumococcal vaccine status: Up to date Covid-19 vaccine status: Completed vaccines  Qualifies for Shingles Vaccine? Yes   Zostavax completed No     Shingrix Completed?: No.    Education has been provided regarding the importance of this vaccine. Patient has been advised to call insurance company to determine out of pocket expense if they have not yet received this vaccine. Advised may also receive vaccine at local pharmacy or Health Dept. Verbalized acceptance and understanding.  Screening Tests Health Maintenance  Topic Date Due  . Fecal DNA (Cologuard)  05/20/2021  . MAMMOGRAM  09/18/2021  . TETANUS/TDAP  01/06/2024  . INFLUENZA VACCINE  Completed  . DEXA SCAN  Completed  . COVID-19 Vaccine  Completed  . Hepatitis C Screening  Completed  . PNA vac Low Risk Adult  Completed    Health Maintenance  There are no preventive care reminders to display for this patient.  Colorectal cancer screening: Completed 05/20/18. Repeat every 3 years Mammogram status: Completed 09/19/19. Repeat every year Bone Density status: Completed 09/25/14. Results reflect: Bone density results: NORMAL. Repeat every 2 years.    Additional Screening:  Hepatitis C Screening: Completed 08/04/16  Vision Screening: Recommended annual ophthalmology exams for early detection of glaucoma and other disorders of the eye. Is the patient up to date with their annual eye exam?  Yes  Who is the provider or what is the name of the office in  which the patient attends annual eye exams? Dr Alanda Slim   Dental Screening: Recommended annual dental exams for proper oral hygiene  Community Resource Referral / Chronic Care Management: CRR required this visit?  No   CCM required this visit?  No      Plan:     I have personally reviewed and noted the following in the patient's chart:   . Medical and social history . Use of alcohol, tobacco or illicit drugs  . Current medications and supplements . Functional ability and status . Nutritional status . Physical activity . Advanced directives . List of other physicians . Hospitalizations, surgeries, and ER visits in  previous 12 months . Vitals . Screenings to include cognitive, depression, and falls . Referrals and appointments  In addition, I have reviewed and discussed with patient certain preventive protocols, quality metrics, and best practice recommendations. A written personalized care plan for preventive services as well as general preventive health recommendations were provided to patient.     Willette Brace, LPN   [id number]   Nurse Notes: None

## 2020-09-17 ENCOUNTER — Other Ambulatory Visit: Payer: Self-pay | Admitting: Cardiology

## 2020-11-05 ENCOUNTER — Other Ambulatory Visit: Payer: Medicare Other

## 2020-11-05 DIAGNOSIS — Z20822 Contact with and (suspected) exposure to covid-19: Secondary | ICD-10-CM

## 2020-11-06 ENCOUNTER — Other Ambulatory Visit: Payer: Self-pay | Admitting: Family Medicine

## 2020-11-06 ENCOUNTER — Telehealth (INDEPENDENT_AMBULATORY_CARE_PROVIDER_SITE_OTHER): Payer: Medicare Other | Admitting: Family Medicine

## 2020-11-06 ENCOUNTER — Other Ambulatory Visit: Payer: Self-pay

## 2020-11-06 ENCOUNTER — Encounter: Payer: Self-pay | Admitting: Family Medicine

## 2020-11-06 DIAGNOSIS — U071 COVID-19: Secondary | ICD-10-CM

## 2020-11-06 NOTE — Progress Notes (Signed)
TELEPHONE ENCOUNTER   Patient verbally agreed to telephone visit and is aware that copayment and coinsurance may apply. Patient was treated using telemedicine according to accepted telemedicine protocols.  Location of the patient: home Location of provider: [place], [place] of all persons participating in the telemedicine service and role in the encounter: Leamon Arnt, MD Reymundo Poll, CMA   Subjective  CC:  Chief Complaint  Patient presents with  . covid symptoms    Headache, nasal congestion, body aches, productive cough - all started Saturday, COVID test last night - still waiting on results    Same day acute visit; PCP not available. New pt to me. Chart reviewed.   HPI: Felicia Perry is a 75 y.o. female who was telephoned today to address the problems listed above in the chief complaint.  "I'd like my covid test results": she was tested here in the after hour testing clinic last night. covid symptoms as noted above. No fevers. Some doe but none at rest. No wheezing or pleuritic chest pain or productive cough. Fair appetite and eating healthy. Resting well. No GI sxs. Has headache: tylenol.   Multiple comorbidities; fully vaccinated and boosted.   ASSESSMENT: 1. COVID-19      suspect covid infection:  Supportive care discussed in detail. Stable. To monitor respiratory status. Results should be back tomorrow or next day. Discussed isolation and emergency sxs.   Time spent with the patient (non face-to-face time during this virtual encounter): 15 minutes, spent in obtaining information about her symptoms, reviewing her previous labs, evaluations, and treatments, counseling her about her condition (please see the discussed topics above), and developing a plan to further investigate it; the patient was provided an opportunity to ask questions and all were answered. The patient agreed with the plan and demonstrated an understanding of the  instructions.   The patient was advised to call back or seek an in-person evaluation if the symptoms worsen or if the condition fails to improve as anticipated.  Follow up: No follow-ups on file.  02/05/2021  No orders of the defined types were placed in this encounter.  No orders of the defined types were placed in this encounter.    I reviewed the patients updated PMH, FH, and SocHx.    Patient Active Problem List   Diagnosis Date Noted  . Greater trochanteric bursitis of left hip 08/13/2020  . Senile purpura ([place]) 08/07/2020  . Myalgia due to statin 06/25/2020  . Carotid stenosis 01/23/2020  . Chronic diastolic CHF (congestive heart failure) ([place]) 01/12/2019  . Degenerative arthritis of knee, bilateral 12/08/2018  . Partial hamstring tear, initial encounter 11/16/2018  . Hyperlipidemia, unspecified 07/11/2018  . Nonallopathic lesion of sacral region 07/05/2018  . Nonallopathic lesion of cervical region 07/05/2018  . DDD (degenerative disc disease), cervical 05/04/2018  . Degenerative arthritis of left knee 09/15/2017  . H/O hyperparathyroidism 08/18/2016  . Coronary artery disease   . PUD (peptic ulcer disease)   . Syncope 08/04/2016  . Vitamin D deficiency 08/20/2015  . Hyperglycemia 08/06/2015  . Xerosis of skin 08/06/2015  . Hypersomnia with sleep apnea 03/26/2015  . Chronic pain syndrome 03/26/2015  . Nonallopathic lesion of lumbosacral region 11/13/2014  . Osteoarthritis of left lower extremity 10/17/2014  . Chronic meniscal tear of knee 10/17/2014  . Nonallopathic lesion of thoracic region 09/14/2014  . Nonallopathic lesion-rib cage 08/21/2014  . Tendinopathy of right rotator cuff 07/30/2014  . Piriformis syndrome of right side 07/30/2014  .  Insomnia 07/13/2014  . Limited joint range of motion 07/13/2014  . Rash 07/13/2014  . GERD (gastroesophageal reflux disease) 02/25/2011  . SI (sacroiliac) joint dysfunction 10/10/2007  . LOW BACK PAIN 07/01/2007  .  Depression 04/22/2007  . Essential hypertension 04/22/2007  . Asthma 04/22/2007   Current Meds  Medication Sig  . acetaminophen (TYLENOL) 325 MG tablet Take 650 mg by mouth every 6 (six) hours as needed.  Marland Kitchen albuterol (VENTOLIN HFA) 108 (90 Base) MCG/ACT inhaler Inhale 2 puffs into the lungs every 6 (six) hours as needed for wheezing or shortness of breath.  . ALPRAZolam (XANAX) 1 MG tablet TAKE ONE TABLET AT BEDTIME AS NEEDED FOR SLEEP  . amLODipine (NORVASC) 10 MG tablet TAKE ONE TABLET DAILY  . aspirin EC 81 MG tablet Take 1 tablet (81 mg total) by mouth daily.  . Bempedoic Acid-Ezetimibe (NEXLIZET) 180-10 MG TABS Take 1 tablet by mouth daily.  . budesonide-formoterol (SYMBICORT) 160-4.5 MCG/ACT inhaler Inhale 2 puffs into the lungs as needed.  . famotidine (PEPCID) 20 MG tablet Take 1 tablet (20 mg total) by mouth 2 (two) times daily.  . irbesartan (AVAPRO) 300 MG tablet TAKE ONE TABLET DAILY  . levocetirizine (XYZAL) 5 MG tablet TAKE ONE TABLET EVERY MORNING  . Magnesium 200 MG TABS Take 1 tablet (200 mg total) by mouth daily.  . metoprolol succinate (TOPROL-XL) 50 MG 24 hr tablet TAKE ONE TABLET DAILY WITH OR IMMEDIATELY FOLLOWING A MEAL  . omega-3 acid ethyl esters (LOVAZA) 1 g capsule TAKE TWO CAPSULES EVERY DAY  . venlafaxine XR (EFFEXOR-XR) 75 MG 24 hr capsule TAKE ONE CAPSULE EACH DAY WITH BREAKFAST  . Vitamin D, Ergocalciferol, (DRISDOL) 50000 units CAPS capsule Take 2,000 Units by mouth daily.     Allergies: Patient is allergic to celexa [citalopram hydrobromide], cephalosporins, clarithromycin, doxycycline hyclate, levofloxacin, penicillins, rosuvastatin, and zolpidem tartrate. Family History: Patient family history includes COPD in her father; Dementia in her maternal grandmother and mother; Diabetes in her mother; Heart disease in her mother; Hypertension in her mother. Social History:  Patient  reports that she quit smoking about 33 years ago. Her smoking use included  cigarettes. She has a 26.00 pack-year smoking history. She has never used smokeless tobacco. She reports that she does not drink alcohol and does not use drugs.  Review of Systems: Constitutional: Negative for fever malaise or anorexia Cardiovascular: negative for chest pain Respiratory: negative for SOB or persistent cough Gastrointestinal: negative for abdominal pain  Speaking in full sentences; nl mentation

## 2020-11-08 LAB — NOVEL CORONAVIRUS, NAA: SARS-CoV-2, NAA: NOT DETECTED

## 2020-11-13 ENCOUNTER — Ambulatory Visit: Payer: Medicare Other | Admitting: Family Medicine

## 2020-11-14 ENCOUNTER — Other Ambulatory Visit: Payer: Self-pay | Admitting: Family Medicine

## 2020-11-14 NOTE — Telephone Encounter (Signed)
Last OV 08/07/20 Last refill 07/29/20 #90/0 Next OV 02/05/21

## 2020-11-20 ENCOUNTER — Other Ambulatory Visit: Payer: Self-pay | Admitting: Family Medicine

## 2020-11-20 DIAGNOSIS — Z1231 Encounter for screening mammogram for malignant neoplasm of breast: Secondary | ICD-10-CM

## 2020-11-22 ENCOUNTER — Ambulatory Visit
Admission: RE | Admit: 2020-11-22 | Discharge: 2020-11-22 | Disposition: A | Discharge status: Home / Self Care | Payer: Medicare Other | Source: Ambulatory Visit | Attending: Family Medicine | Admitting: Family Medicine

## 2020-11-22 ENCOUNTER — Other Ambulatory Visit: Payer: Self-pay

## 2020-11-22 DIAGNOSIS — Z1231 Encounter for screening mammogram for malignant neoplasm of breast: Secondary | ICD-10-CM

## 2020-11-26 ENCOUNTER — Ambulatory Visit: Payer: Medicare Other | Admitting: Family Medicine

## 2020-11-26 ENCOUNTER — Other Ambulatory Visit: Payer: Self-pay

## 2020-11-26 ENCOUNTER — Encounter: Payer: Self-pay | Admitting: Family Medicine

## 2020-11-26 VITALS — BP 160/90 | HR 84 | Ht 60.0 in | Wt 174.0 lb

## 2020-11-26 DIAGNOSIS — M999 Biomechanical lesion, unspecified: Secondary | ICD-10-CM

## 2020-11-26 DIAGNOSIS — M533 Sacrococcygeal disorders, not elsewhere classified: Secondary | ICD-10-CM | POA: Diagnosis not present

## 2020-11-26 NOTE — Progress Notes (Signed)
[facility] [address] [place] Phone: [phone] Subjective:   I Felicia Perry am serving as a Education administrator for Dr. Hulan Saas.  This visit occurred during the SARS-CoV-2 public health emergency.  Safety protocols were in place, including screening questions prior to the visit, additional usage of staff PPE, and extensive cleaning of exam room while observing appropriate contact time as indicated for disinfecting solutions.   I'm seeing this patient by the request  of:  Marin Olp, MD  CC: Neck and back pain follow-up  [id number]  Felicia Perry is a 75 y.o. female coming in with complaint of back and neck pain. OMT 08/13/2020. Patient states she is doing well. Knees are doing well. Sometimes they get tight. Cleaned a few houses today and states she is tight and hasn't had any pain.  Patient states that it has not been in a lot of pain and has been taking anything such as ibuprofen or Tylenol at all.  Patient has been doing relatively well overall.  Patient did have the loss of 3 friends over the holidays and because of that she has been not as active and has gained some weight which she knows is also causing some discomfort.  Medications patient has been prescribed: Effexor          Reviewed prior external information including notes and imaging from previsou exam, outside providers and external EMR if available.   As well as notes that were available from care everywhere and other healthcare systems.  Past medical history, social, surgical and family history all reviewed in electronic medical record.  No pertanent information unless stated regarding to the chief complaint.   Past Medical History:  Diagnosis Date  . Adenomatous polyp 11/23/2005  . Anxiety   . Chronic diastolic CHF (congestive heart failure) ([place])   . Coronary artery disease    a. HC on 08/14/16 showed RCA CTO s/p PCI, 60% LCX disease managed medically  with recs to consider staged PCI if continued symptoms.   Marland Kitchen DIVERTICULOSIS, COLON 04/22/2007       . GERD (gastroesophageal reflux disease)   . History of shingles   . Hypertension   . Hypertensive heart disease   . Internal hemorrhoids   . Osteoarthritis    "knees, hands, lower back" (08/14/2016)  . PUD (peptic ulcer disease)   . Sleep apnea    a. resolved with weight loss    Allergies  Allergen Reactions  . Celexa [Citalopram Hydrobromide]     psychosis  . Cephalosporins     REACTION: tongue swelling  . Clarithromycin     REACTION: blisters in mouth  . Doxycycline Hyclate     REACTION: nausea, vomiting  . Levofloxacin     REACTION: tongue swells  . Penicillins     REACTION: per patient causes rash,hives  . Rosuvastatin Other (See Comments)    Pt reports causes bilateral lower extremity muscle aches.   . Zolpidem Tartrate     REACTION: difficulty with concentration     Review of Systems:  No headache, visual changes, nausea, vomiting, diarrhea, constipation, dizziness, abdominal pain, skin rash, fevers, chills, night sweats, weight loss, swollen lymph nodes, body aches, joint swelling, chest pain, shortness of breath, mood changes. POSITIVE muscle aches  Objective  Blood pressure (!) 160/90, pulse 84, height 5' (1.524 m), weight 174 lb (78.9 kg), SpO2 97 %.   General: No apparent distress alert and oriented x3 mood and affect normal,  dressed appropriately.  HEENT: Pupils equal, extraocular movements intact  Respiratory: Patient's speak in full sentences and does not appear short of breath  Cardiovascular: No lower extremity edema, non tender, no erythema  Gait mild antalgic MSK:   Back -does have some loss of lordosis.  Tightness more in the thoracolumbar juncture right greater than left.  Mild tightness of the right sacroiliac joint as well.  Osteopathic findings  C6 flexed rotated and side bent left T3 extended rotated and side bent right inhaled rib T5 extended  rotated and side bent left L1 flexed rotated and side bent right Sacrum right on right       Assessment and Plan:  SI (sacroiliac) joint dysfunction Known mild arthritic changes.  Doing relatively well well with conservative therapy.  Responding very well to osteopathic manipulation in 18-month intervals.  No change in medication.  We will continue on the Effexor at this point but will consider potentially decreasing her at some point but is helping with pain and mood.    Nonallopathic problems  Decision today to treat with OMT was based on Physical Exam  After verbal consent patient was treated with HVLA, ME, FPR techniques in cervical, rib, thoracic, lumbar, and sacral  areas  Patient tolerated the procedure well with improvement in symptoms  Patient given exercises, stretches and lifestyle modifications  See medications in patient instructions if given  Patient will follow up in 4-8 weeks      The above documentation has been reviewed and is accurate and complete [name], DO       Note: This dictation was prepared with Dragon dictation along with smaller phrase technology. Any transcriptional errors that result from this process are unintentional.

## 2020-11-26 NOTE — Assessment & Plan Note (Signed)
Known mild arthritic changes.  Doing relatively well well with conservative therapy.  Responding very well to osteopathic manipulation in 80-month intervals.  No change in medication.  We will continue on the Effexor at this point but will consider potentially decreasing her at some point but is helping with pain and mood.

## 2020-11-26 NOTE — Patient Instructions (Signed)
Good to see you So sorry for your loss Don't be so hard on yourself you are doing great See me again in 3 months

## 2021-01-01 ENCOUNTER — Other Ambulatory Visit: Payer: Self-pay

## 2021-01-01 ENCOUNTER — Ambulatory Visit (INDEPENDENT_AMBULATORY_CARE_PROVIDER_SITE_OTHER): Payer: Medicare Other | Admitting: Physician Assistant

## 2021-01-01 ENCOUNTER — Encounter: Payer: Self-pay | Admitting: Physician Assistant

## 2021-01-01 DIAGNOSIS — Z85828 Personal history of other malignant neoplasm of skin: Secondary | ICD-10-CM | POA: Diagnosis not present

## 2021-01-01 DIAGNOSIS — L814 Other melanin hyperpigmentation: Secondary | ICD-10-CM | POA: Diagnosis not present

## 2021-01-01 DIAGNOSIS — L578 Other skin changes due to chronic exposure to nonionizing radiation: Secondary | ICD-10-CM

## 2021-01-01 DIAGNOSIS — L821 Other seborrheic keratosis: Secondary | ICD-10-CM | POA: Diagnosis not present

## 2021-01-01 DIAGNOSIS — Z1283 Encounter for screening for malignant neoplasm of skin: Secondary | ICD-10-CM | POA: Diagnosis not present

## 2021-01-01 DIAGNOSIS — L570 Actinic keratosis: Secondary | ICD-10-CM | POA: Diagnosis not present

## 2021-01-01 DIAGNOSIS — D229 Melanocytic nevi, unspecified: Secondary | ICD-10-CM

## 2021-01-01 NOTE — Progress Notes (Signed)
[facility] [address] [place] Phone: [phone] Subjective:   I [name] am serving as a Education administrator for Dr. Hulan Saas.  This visit occurred during the SARS-CoV-2 public health emergency.  Safety protocols were in place, including screening questions prior to the visit, additional usage of staff PPE, and extensive cleaning of exam room while observing appropriate contact time as indicated for disinfecting solutions.   I'm seeing this patient by the request  of:  Marin Olp, MD  CC: low back pain follow up   [id number]  Beaulah H Ignatowski is a 75 y.o. female coming in with complaint of back and neck pain. OMT 11/26/2020. Patient states she is in a lot of pain and states it feels like her neck is broken. Pain is effecting her neck. Pain is now in the anterior neck. Numbness and tingling bilaterally. Arms feel heavy and as if she has been lifting weights. Pain is radiating into the occiput and into her back. Taking extra strength tylenol. Never felt pain like this and states she hasn't been sleeping. Has hand a headache for 3 weeks.   Medications patient has been prescribed: Effexor  Taking: Yes         Reviewed prior external information including notes and imaging from previsou exam, outside providers and external EMR if available.   As well as notes that were available from care everywhere and other healthcare systems.  Past medical history, social, surgical and family history all reviewed in electronic medical record.  No pertanent information unless stated regarding to the chief complaint.   Past Medical History:  Diagnosis Date  . Adenomatous polyp 11/23/2005  . Anxiety   . Basal cell carcinoma 2021   right cheek per patient at [facility]  . Chronic diastolic CHF (congestive heart failure) ([place])   . Coronary artery disease    a. HC on 08/14/16 showed RCA CTO s/p PCI, 60% LCX disease managed medically with  recs to consider staged PCI if continued symptoms.   Marland Kitchen DIVERTICULOSIS, COLON 04/22/2007       . GERD (gastroesophageal reflux disease)   . History of shingles   . Hypertension   . Hypertensive heart disease   . Internal hemorrhoids   . Osteoarthritis    "knees, hands, lower back" (08/14/2016)  . PUD (peptic ulcer disease)   . Sleep apnea    a. resolved with weight loss    Allergies  Allergen Reactions  . Celexa [Citalopram Hydrobromide]     psychosis  . Cephalosporins     REACTION: tongue swelling  . Clarithromycin     REACTION: blisters in mouth  . Doxycycline Hyclate     REACTION: nausea, vomiting  . Levofloxacin     REACTION: tongue swells  . Penicillins     REACTION: per patient causes rash,hives  . Rosuvastatin Other (See Comments)    Pt reports causes bilateral lower extremity muscle aches.   . Zolpidem Tartrate     REACTION: difficulty with concentration     Review of Systems:  No  visual changes, nausea, vomiting, diarrhea, constipation,  abdominal pain, skin rash, fevers, chills, night sweats, weight loss, swollen lymph nodes, joint swelling, chest pain, shortness of breath, mood changes. POSITIVE muscle aches, body aches, headache, dizziness  Objective  Blood pressure (!) 150/88, pulse 85, height 5' (1.524 m), weight 173 lb (78.5 kg), SpO2 98 %.   General: Patient is alert but does seem to be significantly uncomfortable.  Seems to be very anxious HEENT: Pupils equal, extraocular movements intact  Respiratory: Patient's speak in full sentences and does not appear short of breath  Cardiovascular: No lower extremity edema, non tender, no erythema  Patient has some mild difficulty today.  Patient seems to be stiff in the neck.  Patient has some limited range of motion in all planes.  Patient does have 5 out of 5 strength of the hands.  Patient does seem to have some mild weakness of the proximal shoulders.  Patient is complaining more pain on the left side of the  neck and radiating up more anteriorly.  Patient also feels that there is tightness of the left side of the neck.  Mild positive worsening pain with Spurling's bilaterally but no radicular symptoms.  Any type of movement patient does hold her anterior head.  Does seem to have some difficulty with concentration today.  Cranial nerves though do appear to be intact.        Assessment and Plan:         The above documentation has been reviewed and is accurate and complete [name], DO       Note: This dictation was prepared with Dragon dictation along with smaller phrase technology. Any transcriptional errors that result from this process are unintentional.

## 2021-01-02 ENCOUNTER — Other Ambulatory Visit: Payer: Self-pay

## 2021-01-02 ENCOUNTER — Emergency Department (HOSPITAL_COMMUNITY): Payer: Medicare Other

## 2021-01-02 ENCOUNTER — Ambulatory Visit: Payer: Medicare Other | Admitting: Family Medicine

## 2021-01-02 ENCOUNTER — Emergency Department (HOSPITAL_COMMUNITY)
Admission: EM | Admit: 2021-01-02 | Discharge: 2021-01-02 | Disposition: A | Discharge status: Home / Self Care | Payer: Medicare Other | Attending: Emergency Medicine | Admitting: Emergency Medicine

## 2021-01-02 ENCOUNTER — Encounter: Payer: Self-pay | Admitting: Family Medicine

## 2021-01-02 DIAGNOSIS — I5032 Chronic diastolic (congestive) heart failure: Secondary | ICD-10-CM | POA: Insufficient documentation

## 2021-01-02 DIAGNOSIS — I2510 Atherosclerotic heart disease of native coronary artery without angina pectoris: Secondary | ICD-10-CM | POA: Insufficient documentation

## 2021-01-02 DIAGNOSIS — Z7982 Long term (current) use of aspirin: Secondary | ICD-10-CM | POA: Diagnosis not present

## 2021-01-02 DIAGNOSIS — R2681 Unsteadiness on feet: Secondary | ICD-10-CM | POA: Diagnosis not present

## 2021-01-02 DIAGNOSIS — R42 Dizziness and giddiness: Secondary | ICD-10-CM | POA: Diagnosis not present

## 2021-01-02 DIAGNOSIS — R202 Paresthesia of skin: Secondary | ICD-10-CM | POA: Insufficient documentation

## 2021-01-02 DIAGNOSIS — H539 Unspecified visual disturbance: Secondary | ICD-10-CM

## 2021-01-02 DIAGNOSIS — Z79899 Other long term (current) drug therapy: Secondary | ICD-10-CM | POA: Insufficient documentation

## 2021-01-02 DIAGNOSIS — Z87891 Personal history of nicotine dependence: Secondary | ICD-10-CM | POA: Diagnosis not present

## 2021-01-02 DIAGNOSIS — I6522 Occlusion and stenosis of left carotid artery: Secondary | ICD-10-CM | POA: Diagnosis not present

## 2021-01-02 DIAGNOSIS — I110 Hypertensive heart disease with heart failure: Secondary | ICD-10-CM | POA: Insufficient documentation

## 2021-01-02 DIAGNOSIS — M542 Cervicalgia: Secondary | ICD-10-CM | POA: Insufficient documentation

## 2021-01-02 DIAGNOSIS — R519 Headache, unspecified: Secondary | ICD-10-CM | POA: Insufficient documentation

## 2021-01-02 DIAGNOSIS — R200 Anesthesia of skin: Secondary | ICD-10-CM | POA: Diagnosis not present

## 2021-01-02 DIAGNOSIS — I10 Essential (primary) hypertension: Secondary | ICD-10-CM | POA: Diagnosis not present

## 2021-01-02 LAB — COMPREHENSIVE METABOLIC PANEL
ALT: 25 U/L (ref 0–44)
AST: 30 U/L (ref 15–41)
Albumin: 4.3 g/dL (ref 3.5–5.0)
Alkaline Phosphatase: 69 U/L (ref 38–126)
Anion gap: 9 (ref 5–15)
BUN: 20 mg/dL (ref 8–23)
CO2: 25 mmol/L (ref 22–32)
Calcium: 9.4 mg/dL (ref 8.9–10.3)
Chloride: 105 mmol/L (ref 98–111)
Creatinine, Ser: 0.81 mg/dL (ref 0.44–1.00)
GFR, Estimated: >60 mL/min (ref >60)
Glucose, Bld: 130 mg/dL — ABNORMAL HIGH (ref 70–99)
Potassium: 4 mmol/L (ref 3.5–5.1)
Sodium: 139 mmol/L (ref 135–145)
Total Bilirubin: 0.9 mg/dL (ref 0.3–1.2)
Total Protein: 7.7 g/dL (ref 6.5–8.1)

## 2021-01-02 LAB — CBG MONITORING, ED: Glucose-Capillary: 142 mg/dL — ABNORMAL HIGH (ref 70–99)

## 2021-01-02 LAB — DIFFERENTIAL
Abs Immature Granulocytes: 0.05 10*3/uL (ref 0.00–0.07)
Basophils Absolute: 0.1 10*3/uL (ref 0.0–0.1)
Basophils Relative: 2 %
Eosinophils Absolute: 0.3 10*3/uL (ref 0.0–0.5)
Eosinophils Relative: 4 %
Immature Granulocytes: 1 %
Lymphocytes Relative: 30 %
Lymphs Abs: 2.4 10*3/uL (ref 0.7–4.0)
Monocytes Absolute: 1 10*3/uL (ref 0.1–1.0)
Monocytes Relative: 12 %
Neutro Abs: 4.2 10*3/uL (ref 1.7–7.7)
Neutrophils Relative %: 51 %

## 2021-01-02 LAB — APTT: aPTT: 38 seconds — ABNORMAL HIGH (ref 24–36)

## 2021-01-02 LAB — I-STAT CHEM 8, ED
BUN: 23 mg/dL (ref 8–23)
Calcium, Ion: 1.23 mmol/L (ref 1.15–1.40)
Chloride: 106 mmol/L (ref 98–111)
Creatinine, Ser: 0.7 mg/dL (ref 0.44–1.00)
Glucose, Bld: 129 mg/dL — ABNORMAL HIGH (ref 70–99)
HCT: 42 % (ref 36.0–46.0)
Hemoglobin: 14.3 g/dL (ref 12.0–15.0)
Potassium: 4.2 mmol/L (ref 3.5–5.1)
Sodium: 141 mmol/L (ref 135–145)
TCO2: 24 mmol/L (ref 22–32)

## 2021-01-02 LAB — CBC
HCT: 44 % (ref 36.0–46.0)
Hemoglobin: 14.7 g/dL (ref 12.0–15.0)
MCH: 30.6 pg (ref 26.0–34.0)
MCHC: 33.4 g/dL (ref 30.0–36.0)
MCV: 91.7 fL (ref 80.0–100.0)
Platelets: 307 10*3/uL (ref 150–400)
RBC: 4.8 MIL/uL (ref 3.87–5.11)
RDW: 12.3 % (ref 11.5–15.5)
WBC: 8.1 10*3/uL (ref 4.0–10.5)
nRBC: 0 % (ref 0.0–0.2)

## 2021-01-02 LAB — PROTIME-INR
INR: 1.1 (ref 0.8–1.2)
Prothrombin Time: 13.4 seconds (ref 11.4–15.2)

## 2021-01-02 MED ORDER — METOCLOPRAMIDE HCL 5 MG/ML IJ SOLN
5.0000 mg | Freq: Once | INTRAMUSCULAR | Status: AC
  Administered 2021-01-02: 5 mg via INTRAVENOUS
  Filled 2021-01-02: qty 2

## 2021-01-02 MED ORDER — DEXAMETHASONE SODIUM PHOSPHATE 10 MG/ML IJ SOLN
10.0000 mg | Freq: Once | INTRAMUSCULAR | Status: AC
  Administered 2021-01-02: 10 mg via INTRAVENOUS
  Filled 2021-01-02: qty 1

## 2021-01-02 MED ORDER — DIPHENHYDRAMINE HCL 50 MG/ML IJ SOLN
25.0000 mg | Freq: Once | INTRAMUSCULAR | Status: AC
  Administered 2021-01-02: 25 mg via INTRAVENOUS
  Filled 2021-01-02: qty 1

## 2021-01-02 MED ORDER — PREGABALIN 100 MG PO CAPS: 100.0000 mg | ORAL_CAPSULE | Freq: Three times a day (TID) | ORAL | 0 refills | Status: DC

## 2021-01-02 MED ORDER — SODIUM CHLORIDE 0.9% FLUSH: 3.0000 mL | Freq: Once | INTRAVENOUS | Status: DC

## 2021-01-02 MED ORDER — IOHEXOL 350 MG/ML SOLN
75.0000 mL | Freq: Once | INTRAVENOUS | Status: AC | PRN
  Administered 2021-01-02: 75 mL via INTRAVENOUS

## 2021-01-02 MED ORDER — ASPIRIN EC 325 MG PO TBEC: 325.0000 mg | DELAYED_RELEASE_TABLET | Freq: Every day | ORAL | 0 refills | Status: DC

## 2021-01-02 MED ORDER — KETOROLAC TROMETHAMINE 30 MG/ML IJ SOLN
30.0000 mg | Freq: Once | INTRAMUSCULAR | Status: AC
  Administered 2021-01-02: 30 mg via INTRAVENOUS
  Filled 2021-01-02: qty 1

## 2021-01-02 MED ORDER — ACETAMINOPHEN 500 MG PO TABS
1000.0000 mg | ORAL_TABLET | Freq: Once | ORAL | Status: AC
  Administered 2021-01-02: 1000 mg via ORAL
  Filled 2021-01-02: qty 2

## 2021-01-02 NOTE — ED Notes (Signed)
Pt in MRI.

## 2021-01-02 NOTE — ED Provider Notes (Signed)
[facility] DEPARTMENT Provider Note   CSN: [id number] Arrival date & time: 01/02/21  1120     History Chief Complaint  Patient presents with  . Stroke Symptoms    Felicia Perry is a 75 y.o. female.  HPI   75 year old female with past medical history of HTN, CAD, CHF presents to the emergency department with concern for headache, neck pain, unsteady gait and numbness and tingling in all 10 fingers.  Patient states that she has had intermittent left-sided neck pain going on chronically.  More recently in the past 3 weeks she developed a frontal headache that has become persistent and more severe.  In the last day the left-sided neck pain has become severe as well and today she feels like when she walks that she is veering to the right side and she feels a numbness and tingling sensation in all 10 fingers.  Denies any other acute neuro symptoms.  No head or neck injury.  No fever.  Past Medical History:  Diagnosis Date  . Adenomatous polyp 11/23/2005  . Anxiety   . Basal cell carcinoma 2021   right cheek per patient at [facility]  . Chronic diastolic CHF (congestive heart failure) ([place])   . Coronary artery disease    a. HC on 08/14/16 showed RCA CTO s/p PCI, 60% LCX disease managed medically with recs to consider staged PCI if continued symptoms.   Marland Kitchen DIVERTICULOSIS, COLON 04/22/2007       . GERD (gastroesophageal reflux disease)   . History of shingles   . Hypertension   . Hypertensive heart disease   . Internal hemorrhoids   . Osteoarthritis    "knees, hands, lower back" (08/14/2016)  . PUD (peptic ulcer disease)   . Sleep apnea    a. resolved with weight loss    Patient Active Problem List   Diagnosis Date Noted  . Intractable headache 01/02/2021  . Greater trochanteric bursitis of left hip 08/13/2020  . Senile purpura ([place]) 08/07/2020  . Myalgia due to statin 06/25/2020  . Carotid stenosis 01/23/2020  . Chronic diastolic CHF  (congestive heart failure) ([place]) 01/12/2019  . Degenerative arthritis of knee, bilateral 12/08/2018  . Partial hamstring tear, initial encounter 11/16/2018  . Hyperlipidemia, unspecified 07/11/2018  . Nonallopathic lesion of sacral region 07/05/2018  . Nonallopathic lesion of cervical region 07/05/2018  . DDD (degenerative disc disease), cervical 05/04/2018  . Degenerative arthritis of left knee 09/15/2017  . H/O hyperparathyroidism 08/18/2016  . Coronary artery disease   . PUD (peptic ulcer disease)   . Syncope 08/04/2016  . Vitamin D deficiency 08/20/2015  . Hyperglycemia 08/06/2015  . Xerosis of skin 08/06/2015  . Hypersomnia with sleep apnea 03/26/2015  . Chronic pain syndrome 03/26/2015  . Nonallopathic lesion of lumbosacral region 11/13/2014  . Osteoarthritis of left lower extremity 10/17/2014  . Chronic meniscal tear of knee 10/17/2014  . Nonallopathic lesion of thoracic region 09/14/2014  . Nonallopathic lesion-rib cage 08/21/2014  . Tendinopathy of right rotator cuff 07/30/2014  . Piriformis syndrome of right side 07/30/2014  . Insomnia 07/13/2014  . Limited joint range of motion 07/13/2014  . Rash 07/13/2014  . GERD (gastroesophageal reflux disease) 02/25/2011  . SI (sacroiliac) joint dysfunction 10/10/2007  . LOW BACK PAIN 07/01/2007  . Depression 04/22/2007  . Essential hypertension 04/22/2007  . Asthma 04/22/2007    Past Surgical History:  Procedure Laterality Date  . BREAST EXCISIONAL BIOPSY    . BREAST SURGERY Left 1980s  Fibrous Tumors removed   . CARDIAC CATHETERIZATION N/A 08/14/2016   Procedure: Left Heart Cath and Coronary Angiography;  Surgeon: Sherren Mocha, MD;  Location: [facility];  Service: Cardiovascular;  Laterality: N/A;  . CARDIAC CATHETERIZATION N/A 08/14/2016   Procedure: Coronary Stent Intervention;  Surgeon: Sherren Mocha, MD;  Location: [facility];  Service: Cardiovascular;  Laterality: N/A;  . CATARACT EXTRACTION W/  INTRAOCULAR LENS  IMPLANT, BILATERAL Bilateral 05/2009  . CORONARY ANGIOPLASTY    . detached retina left eye     may 2019  . TUBAL LIGATION  1970s     OB History   No obstetric history on file.     Family History  Problem Relation Age of Onset  . Heart disease Mother   . Hypertension Mother   . Diabetes Mother   . Dementia Mother   . COPD Father   . Dementia Maternal Grandmother   . Colon cancer Neg Hx     Social History   Tobacco Use  . Smoking status: Former Smoker    Packs/day: 1.00    Years: 26.00    Pack years: 26.00    Types: Cigarettes    Quit date: 1989    Years since quitting: 33.2  . Smokeless tobacco: Never Used  Vaping Use  . Vaping Use: Never used  Substance Use Topics  . Alcohol use: No  . Drug use: No    Home Medications Prior to Admission medications   Medication Sig Start Date End Date Taking? Authorizing Provider  acetaminophen (TYLENOL) 325 MG tablet Take 650 mg by mouth every 6 (six) hours as needed.    [provider]  albuterol (VENTOLIN HFA) 108 (90 Base) MCG/ACT inhaler Inhale 2 puffs into the lungs every 6 (six) hours as needed for wheezing or shortness of breath. 01/23/20   Marin Olp, MD  ALPRAZolam Duanne Moron) 1 MG tablet TAKE ONE TABLET AT BEDTIME AS NEEDED FOR SLEEP 08/07/20   Marin Olp, MD  amLODipine (NORVASC) 10 MG tablet TAKE ONE TABLET DAILY 09/17/20   Dorothy Spark, MD  aspirin EC 81 MG tablet Take 1 tablet (81 mg total) by mouth daily. 08/12/16   Dorothy Spark, MD  Bempedoic Acid-Ezetimibe (NEXLIZET) 180-10 MG TABS Take 1 tablet by mouth daily. 04/30/20   Dorothy Spark, MD  budesonide-formoterol [facility]) 160-4.5 MCG/ACT inhaler Inhale 2 puffs into the lungs as needed.    [provider]  famotidine (PEPCID) 20 MG tablet Take 1 tablet (20 mg total) by mouth 2 (two) times daily. 08/07/20   Marin Olp, MD  irbesartan (AVAPRO) 300 MG tablet TAKE ONE TABLET DAILY 08/14/20   Dorothy Spark, MD  levocetirizine Harlow Ohms) 5 MG tablet TAKE ONE TABLET EVERY MORNING 11/14/20   Marin Olp, MD  Magnesium 200 MG TABS Take 1 tablet (200 mg total) by mouth daily. 08/11/18   Dorothy Spark, MD  metoprolol succinate (TOPROL-XL) 50 MG 24 hr tablet TAKE ONE TABLET DAILY WITH OR IMMEDIATELY FOLLOWING A MEAL 08/14/20   Dorothy Spark, MD  nitroGLYCERIN (NITROSTAT) 0.4 MG SL tablet Place 1 tablet (0.4 mg total) under the tongue every 5 (five) minutes as needed for chest pain. 01/23/20 04/22/20  Marin Olp, MD  omega-3 acid ethyl esters (LOVAZA) 1 g capsule TAKE TWO CAPSULES EVERY DAY 09/17/20   Dorothy Spark, MD  venlafaxine XR (EFFEXOR-XR) 75 MG 24 hr capsule TAKE ONE CAPSULE EACH DAY WITH BREAKFAST 11/06/20  [name], DO  Vitamin D, Ergocalciferol, (DRISDOL) 50000 units CAPS capsule Take 2,000 Units by mouth daily.     [provider]    Allergies    Celexa [citalopram hydrobromide], Cephalosporins, Clarithromycin, Doxycycline hyclate, Levofloxacin, Penicillins, Rosuvastatin, and Zolpidem tartrate  Review of Systems   Review of Systems  Constitutional: Negative for chills and fever.  HENT: Negative for congestion.   Eyes: Negative for visual disturbance.  Respiratory: Negative for shortness of breath.   Cardiovascular: Negative for chest pain.  Gastrointestinal: Negative for abdominal pain, diarrhea and vomiting.  Genitourinary: Negative for dysuria.  Musculoskeletal: Positive for neck pain.  Skin: Negative for rash.  Neurological: Positive for dizziness, numbness and headaches.    Physical Exam Updated Vital Signs BP (!) 187/88   Pulse 79   Temp 98.8 F (37.1 C) (Oral)   Resp 14   SpO2 99%   Physical Exam Vitals and nursing note reviewed.  Constitutional:      Appearance: Normal appearance.  HENT:     Head: Normocephalic.     Mouth/Throat:     Mouth: Mucous membranes are moist.  Cardiovascular:     Rate and Rhythm: Normal  rate.  Pulmonary:     Effort: Pulmonary effort is normal. No respiratory distress.  Abdominal:     Palpations: Abdomen is soft.     Tenderness: There is no abdominal tenderness.  Skin:    General: Skin is warm.  Neurological:     Mental Status: She is alert and oriented to person, place, and time.     Comments: Appears to need time to catch her orientation when you stand her up, when she walks there is no stumbling but she states that she feels she is pulling to the right side.  She states that the fingertips feel numb but she is otherwise an NIH of 0.  Psychiatric:        Mood and Affect: Mood normal.     ED Results / Procedures / Treatments   Labs (all labs ordered are listed, but only abnormal results are displayed) Labs Reviewed  COMPREHENSIVE METABOLIC PANEL - Abnormal; Notable for the following components:      Result Value   Glucose, Bld 130 (*)    All other components within normal limits  I-STAT CHEM 8, ED - Abnormal; Notable for the following components:   Glucose, Bld 129 (*)    All other components within normal limits  CBG MONITORING, ED - Abnormal; Notable for the following components:   Glucose-Capillary 142 (*)    All other components within normal limits  CBC  DIFFERENTIAL  PROTIME-INR  APTT    EKG EKG Interpretation  Date/Time:  Thursday January 02 2021 12:20:23 EST Ventricular Rate:  82 PR Interval:  140 QRS Duration: 80 QT Interval:  382 QTC Calculation: 446 R Axis:   73 Text Interpretation: Normal sinus rhythm Nonspecific ST abnormality Abnormal ECG NSR,  no change from previous Confirmed by Lavenia Atlas [phone]) on 01/02/2021 12:51:18 PM   Radiology CT HEAD WO CONTRAST  Result Date: 01/02/2021 CLINICAL DATA:  Dizziness EXAM: CT HEAD WITHOUT CONTRAST TECHNIQUE: Contiguous axial images were obtained from the base of the skull through the vertex without intravenous contrast. COMPARISON:  November 17, 2016 FINDINGS: Brain: There is age related volume  loss. There is no intracranial mass, hemorrhage, extra-axial fluid collection, or midline shift. There is mild decreased attenuation in the centra semiovale bilaterally. Elsewhere brain parenchyma appears unremarkable. No evident acute infarct. Vascular:  No hyperdense vessel. There is calcification in each distal vertebral artery and carotid siphon region. Skull: Bony calvarium appears intact. Sinuses/Orbits: Visualized paranasal sinuses are clear. Orbits appear symmetric bilaterally. Previous cataract removals bilaterally. Other: Mastoid air cells are clear. IMPRESSION: Age related volume loss with mild periventricular small vessel disease. No acute infarct. No mass or hemorrhage. There are foci of arterial vascular calcification. 5 Electronically Signed   By: Lowella Grip III M.D.   On: 01/02/2021 13:52    Procedures Procedures   Medications Ordered in ED Medications  sodium chloride flush (NS) 0.9 % injection 3 mL (3 mLs Intravenous Not Given 01/02/21 1458)  ketorolac (TORADOL) 30 MG/ML injection 30 mg (has no administration in time range)  metoCLOPramide (REGLAN) injection 5 mg (has no administration in time range)  diphenhydrAMINE (BENADRYL) injection 25 mg (has no administration in time range)  dexamethasone (DECADRON) injection 10 mg (has no administration in time range)    ED Course  I have reviewed the triage vital signs and the nursing notes.  Pertinent labs & imaging results that were available during my care of the patient were reviewed by me and considered in my medical decision making (see chart for details).    MDM Rules/Calculators/A&P                          22-year-old female presents the emergency room with frontal headache, left-sided neck pain, unsteady gait and numbness/tingling of all 10 fingertips.  On exam patient appears appropriate.  When she stands up she quickly looks off balance but when she walks she appears to have an appropriate gait, no noticeable ataxia.   Head CT does not show any acute finding.  Spoke with neurology and review of all of her complaints.  With the complaint of headache, neck pain we will pursue vascular imaging and with the concern of unsteady gait/ataxia and numbness of her fingers we will pursue MRI and cervical spine imaging.  Patient signed out pending all imaging.  Neuro exam is unchanged.  Final Clinical Impression(s) / ED Diagnoses Final diagnoses:  None    Rx / DC Orders ED Discharge Orders    None       Lorelle Gibbs, DO 01/02/21 1522

## 2021-01-02 NOTE — ED Notes (Signed)
Pt ambulated to the bathroom.  

## 2021-01-02 NOTE — ED Provider Notes (Signed)
Care of the patient assumed at signout.  10:21 PM Patient awake and alert, notes that her headache has improved, after having been present for about 10 days.  I reviewed the patient's MRI, CT results with her and her family members. Given her presentation with severe pain, she will follow up with outpatient primary care and neurology, but given her resolution here, absence of hemodynamic instability, generally reassuring radiographic studies patient is appropriate for discharge with close outpatient follow-up.   Carmin Muskrat, MD 01/02/21 2222

## 2021-01-02 NOTE — Assessment & Plan Note (Signed)
Patient is having a headache.  Worsened and she has had, seems to be worsening over the course of the week.  Past medical history is significant for carotid stenosis.  Patient does not have any weakness of the upper extremities but patient feels like she is weak.  Patient states that is also associated with dizziness.  At this point I would like to send patient to the emergency room for a CT scan.  We discussed the possibility of an ambulance which patient was adamantly declining.  Patient's son is going to pick her up and take her immediately.   Would recommend possibly CT head as well as CT head neck with the possibility of CT angiogram and possible cardiac work-up.  Patient's vitals at the moment is stable.  If work-up is unremarkable we will consider MRIs and further evaluation for more of the neck pain but due to the acute and severe nature of the symptoms today includes the emergency room evaluation.

## 2021-01-02 NOTE — Discharge Instructions (Signed)
As discussed, your evaluation today has been largely reassuring.  But, it is important that you monitor your condition carefully, and do not hesitate to return to the ED if you develop new, or concerning changes in your condition.  Otherwise, please follow-up with your physician for appropriate ongoing care.  In addition to following up with your primary care physician, please follow-up with our neurology colleagues. Your medications have been adjusted.  Please begin taking a full-strength aspirin daily, in addition to your prescribed pain medication.  Please discuss these changes with your physician, and make adjustments as needed.

## 2021-01-02 NOTE — ED Notes (Signed)
Got patient on the monitor patient is resting with family at bedside and call bell in reach  °

## 2021-01-02 NOTE — ED Notes (Signed)
Pt to CT

## 2021-01-02 NOTE — Patient Instructions (Addendum)
Good to see you With the sever headache that is worsening and the increasing neck pain I recommend going to the emergency room.  I will try to call ahead and tell them you are coming.  If work up there is normal then we can consider MRI of the head and neck which I will put in but lets see what they find in the ER I am thinking of you

## 2021-01-02 NOTE — ED Triage Notes (Signed)
Pt here with reports of L sided headache onset approx 3 weeks ago. Pt also reports neck pain. Pt also with numbness and tingling to fingers. Pt endorses unsteady gait for a week.

## 2021-01-03 ENCOUNTER — Ambulatory Visit: Payer: Medicare Other | Admitting: Physician Assistant

## 2021-01-08 ENCOUNTER — Telehealth: Payer: Self-pay | Admitting: Family Medicine

## 2021-01-08 NOTE — Telephone Encounter (Signed)
Overall good news.  Nothing severe which is great  Neck does have arthritis and could be giving some headaache.  Can either see her and try manipulation  Or would need to consider epidural in the neck.  Not anything time sensitive

## 2021-01-08 NOTE — Telephone Encounter (Signed)
Patient called this morning stating that when she went to the ED (as directed by Dr Tamala Julian), they did a CT Scan and MRI but they did not find anything. They mentioned referring her to Neurology but she has not heard anything from them. She asked what she should do. She has not been working and feels like she should be doing something else at this point.

## 2021-01-09 NOTE — Telephone Encounter (Signed)
Spoke with patient and she would like to schedule an appointment. Patient on schedule 4/4.

## 2021-01-20 ENCOUNTER — Encounter: Payer: Self-pay | Admitting: Physician Assistant

## 2021-01-20 NOTE — Progress Notes (Signed)
   New Patient   Subjective  Felicia Perry is a 75 y.o. female who presents for the following: Annual Exam ([facility] derm removed bcc 2021 right cheek ).   The following portions of the chart were reviewed this encounter and updated as appropriate:  Tobacco  Allergies  Meds  Problems  Med Hx  Surg Hx  Fam Hx      Objective  Well appearing patient in no apparent distress; mood and affect are within normal limits.  A full examination was performed including scalp, head, eyes, ears, nose, lips, neck, chest, axillae, abdomen, back, buttocks, bilateral upper extremities, bilateral lower extremities, hands, feet, fingers, toes, fingernails, and toenails. All findings within normal limits unless otherwise noted below.  Objective  head to toe: Full body skin examination- no atypical moles or non mole skin cancer  Objective  Right Malar Cheek: Erythematous patches with gritty scale.   Assessment & Plan  Encounter for screening for malignant neoplasm of skin head to toe  Yearly  skin check  AK (actinic keratosis) Right Malar Cheek  Destruction of lesion - Right Malar Cheek Complexity: simple   Destruction method: cryotherapy   Informed consent: discussed and consent obtained   Timeout:  patient name, date of birth, surgical site, and procedure verified Lesion destroyed using liquid nitrogen: Yes   Cryotherapy cycles:  3 Outcome: patient tolerated procedure well with no complications    Lentigines - Scattered tan macules - Discussed due to sun exposure - Benign, observe - Call for any changes  Seborrheic Keratoses - Stuck-on, waxy, tan-brown papules and plaques  - Discussed benign etiology and prognosis. - Observe - Call for any changes   Hemangiomas - Red papules - Discussed benign nature - Observe - Call for any changes  Actinic Damage - diffuse scaly erythematous macules with underlying dyspigmentation - Recommend daily broad spectrum sunscreen SPF  30+ to sun-exposed areas, reapply every 2 hours as needed.  - Call for new or changing lesions.  I, Blessyn Sommerville, PA-C, have reviewed all documentation for this visit. The documentation on 01/20/21 for the exam, diagnosis, procedures, and orders are all accurate and complete.

## 2021-01-24 NOTE — Progress Notes (Signed)
[place] [facility] Phone: [phone] Subjective:   Felicia Perry, am serving as a scribe for Dr. Hulan Saas. This visit occurred during the SARS-CoV-2 public health emergency.  Safety protocols were in place, including screening questions prior to the visit, additional usage of staff PPE, and extensive cleaning of exam room while observing appropriate contact time as indicated for disinfecting solutions.   I'm seeing this patient by the request  of:  Marin Olp, MD  CC: Neck pain follow-up  [id number]   01/02/2021 Patient is having a headache.  Worsened and she has had, seems to be worsening over the course of the week.  Past medical history is significant for carotid stenosis.  Patient does not have any weakness of the upper extremities but patient feels like she is weak.  Patient states that is also associated with dizziness.  At this point I would like to send patient to the emergency room for a CT scan.  We discussed the possibility of an ambulance which patient was adamantly declining.  Patient's son is going to pick her up and take her immediately.   Would recommend possibly CT head as well as CT head neck with the possibility of CT angiogram and possible cardiac work-up.  Patient's vitals at the moment is stable.  If work-up is unremarkable we will consider MRIs and further evaluation for more of the neck pain but due to the acute and severe nature of the symptoms today includes the emergency room evaluation.  Update 01/27/2021 Felicia Perry is a 75 y.o. female coming in with complaint of neck pain. Patient states that she is doing better than last visit. Continues to have headaches daily. Is able to rotate her head without pain. Is going to see her optometrist in one month. Is using aspirin daily.    01/02/2021 MRI cervical  IMPRESSION: 1. C3-4: Spondylosis and left-sided facet arthropathy. Mild  left foraminal narrowing that could possibly affect the left C4 nerve. 2. C4-5: Spondylosis and left-sided facet arthropathy. Mild left foraminal narrowing that could possibly affect the C5 nerve. 3. C5-6: Spondylosis. Bilateral bony foraminal narrowing left worse than right. Either C6 nerve could be affected, more likely the left. 4. C6-7: Spondylosis. Bilateral foraminal narrowing left worse than right. Some potential this could affect the left C7 nerve. 5. C7-T1: Facet osteoarthritis. Perry compressive stenosis.  01/02/2021 CT angio neck IMPRESSION: 1. Perry intracranial arterial occlusion or high-grade stenosis. 2. 60 % stenosis of the proximal left internal carotid artery secondary to predominantly calcified atherosclerosis.  MRI brain 01/02/2021 IMPRESSION: Perry acute or reversible finding. Mild to moderate chronic small-vessel ischemic changes of the cerebral hemispheric white matter. Perry visible change since April of 2021.  Patient was given Lyrica in the emergency room.    Past Medical History:  Diagnosis Date  . Adenomatous polyp 11/23/2005  . Anxiety   . Basal cell carcinoma 2021   right cheek per patient at [facility]  . Chronic diastolic CHF (congestive heart failure) ([place])   . Coronary artery disease    a. HC on 08/14/16 showed RCA CTO s/p PCI, 60% LCX disease managed medically with recs to consider staged PCI if continued symptoms.   Marland Kitchen DIVERTICULOSIS, COLON 04/22/2007       . GERD (gastroesophageal reflux disease)   . History of shingles   . Hypertension   . Hypertensive heart disease   . Internal hemorrhoids   . Osteoarthritis    "  knees, hands, lower back" (08/14/2016)  . PUD (peptic ulcer disease)   . Sleep apnea    a. resolved with weight loss   Past Surgical History:  Procedure Laterality Date  . BREAST EXCISIONAL BIOPSY    . BREAST SURGERY Left 1980s   Fibrous Tumors removed   . CARDIAC CATHETERIZATION N/A 08/14/2016   Procedure: Left Heart Cath and  Coronary Angiography;  Surgeon: Sherren Mocha, MD;  Location: [facility];  Service: Cardiovascular;  Laterality: N/A;  . CARDIAC CATHETERIZATION N/A 08/14/2016   Procedure: Coronary Stent Intervention;  Surgeon: Sherren Mocha, MD;  Location: [facility];  Service: Cardiovascular;  Laterality: N/A;  . CATARACT EXTRACTION W/ INTRAOCULAR LENS  IMPLANT, BILATERAL Bilateral 05/2009  . CORONARY ANGIOPLASTY    . detached retina left eye     may 2019  . TUBAL LIGATION  1970s   Social History   Socioeconomic History  . Marital status: Divorced    Spouse name: Not on file  . Number of children: Not on file  . Years of education: Not on file  . Highest education level: Not on file  Occupational History  . Occupation: Cleaning  Tobacco Use  . Smoking status: Former Smoker    Packs/day: 1.00    Years: 26.00    Pack years: 26.00    Types: Cigarettes    Quit date: 1989    Years since quitting: 33.2  . Smokeless tobacco: Never Used  Vaping Use  . Vaping Use: Never used  Substance and Sexual Activity  . Alcohol use: Perry  . Drug use: Perry  . Sexual activity: Not Currently  Other Topics Concern  . Not on file  Social History Narrative   Divorced for 26 years in 2016. 2 sons- 1 son had been in prison now living with her in 2019.  2 granddaughters with 1 living close.        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 Best friend [name] patient of Dr. Yong Channel. Enjoys work, time on Teaching laboratory technician.    Social Determinants of Health   Financial Resource Strain: Low Risk   . Difficulty of Paying Living Expenses: Not hard at all  Food Insecurity: Perry Food Insecurity  . Worried About Charity fundraiser in the Last Year: Never true  . Ran Out of Food in the Last Year: Never true  Transportation Needs: Perry Transportation Needs  . Lack of Transportation  (Medical): Perry  . Lack of Transportation (Non-Medical): Perry  Physical Activity: Sufficiently Active  . Days of Exercise per Week: 5 days  . Minutes of Exercise per Session: 60 min  Stress: Perry Stress Concern Present  . Feeling of Stress : Not at all  Social Connections: Moderately Isolated  . Frequency of Communication with Friends and Family: More than three times a week  . Frequency of Social Gatherings with Friends and Family: Three times a week  . Attends Religious Services: More than 4 times per year  . Active Member of Clubs or Organizations: Perry  . Attends Archivist Meetings: Never  . Marital Status: Divorced   Allergies  Allergen Reactions  . Celexa [Citalopram Hydrobromide]     psychosis  . Cephalosporins     REACTION: tongue swelling  . Clarithromycin     REACTION: blisters in mouth  . Doxycycline  Hyclate     REACTION: nausea, vomiting  . Levofloxacin     REACTION: tongue swells  . Penicillins     REACTION: per patient causes rash,hives  . Rosuvastatin Other (See Comments)    Pt reports causes bilateral lower extremity muscle aches.   . Zolpidem Tartrate     REACTION: difficulty with concentration   Family History  Problem Relation Age of Onset  . Heart disease Mother   . Hypertension Mother   . Diabetes Mother   . Dementia Mother   . COPD Father   . Dementia Maternal Grandmother   . Colon cancer Neg Hx      Current Outpatient Medications (Cardiovascular):  .  amLODipine (NORVASC) 10 MG tablet, TAKE ONE TABLET DAILY .  Bempedoic Acid-Ezetimibe (NEXLIZET) 180-10 MG TABS, Take 1 tablet by mouth daily. .  irbesartan (AVAPRO) 300 MG tablet, TAKE ONE TABLET DAILY .  metoprolol succinate (TOPROL-XL) 50 MG 24 hr tablet, TAKE ONE TABLET DAILY WITH OR IMMEDIATELY FOLLOWING A MEAL .  omega-3 acid ethyl esters (LOVAZA) 1 g capsule, TAKE TWO CAPSULES EVERY DAY .  nitroGLYCERIN (NITROSTAT) 0.4 MG SL tablet, Place 1 tablet (0.4 mg total) under the tongue every 5  (five) minutes as needed for chest pain.  Current Outpatient Medications (Respiratory):  .  albuterol (VENTOLIN HFA) 108 (90 Base) MCG/ACT inhaler, Inhale 2 puffs into the lungs every 6 (six) hours as needed for wheezing or shortness of breath. .  budesonide-formoterol (SYMBICORT) 160-4.5 MCG/ACT inhaler, Inhale 2 puffs into the lungs as needed. Marland Kitchen  levocetirizine (XYZAL) 5 MG tablet, TAKE ONE TABLET EVERY MORNING  Current Outpatient Medications (Analgesics):  .  acetaminophen (TYLENOL) 325 MG tablet, Take 650 mg by mouth every 6 (six) hours as needed. Marland Kitchen  aspirin EC 325 MG tablet, Take 1 tablet (325 mg total) by mouth daily.   Current Outpatient Medications (Other):  .  acyclovir (ZOVIRAX) 400 MG tablet, Take 1 tablet (400 mg total) by mouth 3 (three) times daily. Marland Kitchen  ALPRAZolam (XANAX) 1 MG tablet, TAKE ONE TABLET AT BEDTIME AS NEEDED FOR SLEEP .  famotidine (PEPCID) 20 MG tablet, Take 1 tablet (20 mg total) by mouth 2 (two) times daily. .  Magnesium 200 MG TABS, Take 1 tablet (200 mg total) by mouth daily. .  pregabalin (LYRICA) 100 MG capsule, Take 1 capsule (100 mg total) by mouth 3 (three) times daily. Marland Kitchen  venlafaxine XR (EFFEXOR-XR) 75 MG 24 hr capsule, TAKE ONE CAPSULE EACH DAY WITH BREAKFAST .  Vitamin D, Ergocalciferol, (DRISDOL) 50000 units CAPS capsule, Take 2,000 Units by mouth daily.    Reviewed prior external information including notes and imaging from  primary care provider As well as notes that were available from care everywhere and other healthcare systems.  Past medical history, social, surgical and family history all reviewed in electronic medical record.  Perry pertanent information unless stated regarding to the chief complaint.   Review of Systems:  Perry headache, visual changes, nausea, vomiting, diarrhea, constipation, dizziness, abdominal pain, skin rash, fevers, chills, night sweats, weight loss, swollen lymph nodes, joint swelling, chest pain, shortness of breath,  mood changes. POSITIVE muscle aches, body aches  Objective  Blood pressure (!) 132/102, pulse 85, height 5' (1.524 m), weight 173 lb (78.5 kg), SpO2 97 %.   General: Perry apparent distress alert and oriented x3 mood and affect normal, dressed appropriately.  HEENT: Pupils equal, extraocular movements intact  Respiratory: Patient's speak in full sentences and does not appear short  of breath  Cardiovascular: Perry lower extremity edema, non tender, Perry erythema  Gait normal with good balance and coordination.  MSK: Significant arthritic changes of multiple areas.  Patient does have a chronic pain that seems in the soft tissues.  Patient does have tenderness to palpation in the paraspinal musculature of the lumbar spine as well as in the neck.  Mild crepitus noted.  OMT findings C4 F RS right  T3 E RS right inhaled rib L1 flexed rotated and side bent right Sacrum left on left    Impression and Recommendations:     The above documentation has been reviewed and is accurate and complete [name], DO

## 2021-01-27 ENCOUNTER — Ambulatory Visit: Payer: Medicare Other | Admitting: Family Medicine

## 2021-01-27 ENCOUNTER — Telehealth: Payer: Self-pay | Admitting: Family Medicine

## 2021-01-27 ENCOUNTER — Encounter: Payer: Self-pay | Admitting: Family Medicine

## 2021-01-27 ENCOUNTER — Other Ambulatory Visit: Payer: Self-pay

## 2021-01-27 VITALS — BP 132/102 | HR 85 | Ht 60.0 in | Wt 173.0 lb

## 2021-01-27 DIAGNOSIS — M999 Biomechanical lesion, unspecified: Secondary | ICD-10-CM

## 2021-01-27 DIAGNOSIS — M5030 Other cervical disc degeneration, unspecified cervical region: Secondary | ICD-10-CM

## 2021-01-27 MED ORDER — ACYCLOVIR 400 MG PO TABS: 400.0000 mg | ORAL_TABLET | Freq: Three times a day (TID) | ORAL | 0 refills | Status: DC

## 2021-01-27 NOTE — Assessment & Plan Note (Signed)

## 2021-01-27 NOTE — Assessment & Plan Note (Signed)
Known arthritic changes.  MRI does show that there is some potential for nerve impingement.  Discussed with patient at great length about icing regimen and home exercises.  Discussed continuing the Effexor at this point.  Could consider the possibility of epidurals but patient declined.  Follow-up with me again in 6 weeks.  Responded well to manipulation.

## 2021-01-27 NOTE — Telephone Encounter (Signed)
I would love to see the text next time.  Maybe can show Dr. Yong Channel as well.  I think we discussed and fine with our plan

## 2021-01-27 NOTE — Telephone Encounter (Signed)
Spoke with patient. Felicia Perry understands that we are managing the meds Dr. Tamala Julian prescribed and will save message to share with [place] at next appointment.

## 2021-01-27 NOTE — Telephone Encounter (Signed)
Patient called to let Dr Tamala Julian know that when she was checking out today after seeing him, she got a text message from [facility] that said "You need to ask Dr Tamala Julian now about the 14 prescriptions he has you on". She said that when she was in the hospital, the doctors made a deal about her medications (she discussed this with Dr Tamala Julian while she was here). So she felt that this text message was very unprofessional and did not understand why she would be getting a text about it.

## 2021-01-27 NOTE — Patient Instructions (Signed)
Good to see you Acyclovir 3 times a day for 5 days if you get a flare See Dr. Yong Channel Otherwise the workup is good news Try to stay in the now See me again in 6 weeks

## 2021-02-04 NOTE — Patient Instructions (Addendum)
Health Maintenance Due  Topic Date Due  . COVID-19 Vaccine (3 - Pfizer risk 4-dose series) - let us know when you get next dose 02/14/2020   Point of care a1c today before you leave- as long as 6.5 or below you may leave. Continue to work on healthy eating/regular exercise  Recommended follow up: Return in about 6 months (around 08/07/2021) for physical or sooner if needed.

## 2021-02-04 NOTE — Progress Notes (Addendum)
Phone [phone] In person visit   Subjective:   Felicia Perry is a 75 y.o. year old very pleasant female patient who presents for/with See problem oriented charting Chief Complaint  Patient presents with  . Hypertension  . Hyperglycemia  . Depression  . Gastroesophageal Reflux  . Diastolic CHF    This visit occurred during the SARS-CoV-2 public health emergency.  Safety protocols were in place, including screening questions prior to the visit, additional usage of staff PPE, and extensive cleaning of exam room while observing appropriate contact time as indicated for disinfecting solutions.   Past Medical History-  Patient Active Problem List   Diagnosis Date Noted  . Diabetes mellitus type 2 with complications ([place]) 57/84/6962    Priority: High  . Carotid stenosis 01/23/2020    Priority: High  . Chronic diastolic CHF (congestive heart failure) ([place]) 01/12/2019    Priority: High  . Coronary artery disease of native artery of native heart with stable angina pectoris ([place])     Priority: High  . Syncope 08/04/2016    Priority: High  . statin myalgia 06/25/2020    Priority: Medium  . Hyperlipidemia, unspecified 07/11/2018    Priority: Medium  . H/O hyperparathyroidism 08/18/2016    Priority: Medium  . Vitamin D deficiency 08/20/2015    Priority: Medium  . Chronic pain syndrome 03/26/2015    Priority: Medium  . Insomnia 07/13/2014    Priority: Medium  . GERD (gastroesophageal reflux disease) 02/25/2011    Priority: Medium  . Depression 04/22/2007    Priority: Medium  . Essential hypertension 04/22/2007    Priority: Medium  . Asthma 04/22/2007    Priority: Medium  . Greater trochanteric bursitis of left hip 08/13/2020    Priority: Low  . Senile purpura ([place]) 08/07/2020    Priority: Low  . Degenerative arthritis of knee, bilateral 12/08/2018    Priority: Low  . Partial hamstring tear, initial encounter 11/16/2018    Priority: Low  . Nonallopathic lesion of  sacral region 07/05/2018    Priority: Low  . Nonallopathic lesion of cervical region 07/05/2018    Priority: Low  . DDD (degenerative disc disease), cervical 05/04/2018    Priority: Low  . Degenerative arthritis of left knee 09/15/2017    Priority: Low  . PUD (peptic ulcer disease)     Priority: Low  . Hyperglycemia 08/06/2015    Priority: Low  . Xerosis of skin 08/06/2015    Priority: Low  . Hypersomnia with sleep apnea 03/26/2015    Priority: Low  . Nonallopathic lesion of lumbosacral region 11/13/2014    Priority: Low  . Osteoarthritis of left lower extremity 10/17/2014    Priority: Low  . Chronic meniscal tear of knee 10/17/2014    Priority: Low  . Nonallopathic lesion of thoracic region 09/14/2014    Priority: Low  . Nonallopathic lesion-rib cage 08/21/2014    Priority: Low  . Tendinopathy of right rotator cuff 07/30/2014    Priority: Low  . Piriformis syndrome of right side 07/30/2014    Priority: Low  . Limited joint range of motion 07/13/2014    Priority: Low  . Rash 07/13/2014    Priority: Low  . SI (sacroiliac) joint dysfunction 10/10/2007    Priority: Low  . LOW BACK PAIN 07/01/2007    Priority: Low  . Intractable headache 01/02/2021    Medications- reviewed and updated Current Outpatient Medications  Medication Sig Dispense Refill  . acetaminophen (TYLENOL) 325 MG tablet Take 650 mg by mouth  every 6 (six) hours as needed.    Marland Kitchen amLODipine (NORVASC) 10 MG tablet TAKE ONE TABLET DAILY 90 tablet 1  . aspirin EC 325 MG tablet Take 1 tablet (325 mg total) by mouth daily. 30 tablet 0  . Bempedoic Acid-Ezetimibe (NEXLIZET) 180-10 MG TABS Take 1 tablet by mouth daily. 90 tablet 3  . famotidine (PEPCID) 20 MG tablet Take 1 tablet (20 mg total) by mouth 2 (two) times daily. 60 tablet 5  . irbesartan (AVAPRO) 300 MG tablet TAKE ONE TABLET DAILY 90 tablet 2  . levocetirizine (XYZAL) 5 MG tablet TAKE ONE TABLET EVERY MORNING 90 tablet 0  . Magnesium 200 MG TABS Take 1  tablet (200 mg total) by mouth daily. 30 each   . metoprolol succinate (TOPROL-XL) 50 MG 24 hr tablet TAKE ONE TABLET DAILY WITH OR IMMEDIATELY FOLLOWING A MEAL 90 tablet 2  . nitroGLYCERIN (NITROSTAT) 0.4 MG SL tablet Place 1 tablet (0.4 mg total) under the tongue every 5 (five) minutes as needed for chest pain. 25 tablet 6  . omega-3 acid ethyl esters (LOVAZA) 1 g capsule TAKE TWO CAPSULES EVERY DAY 180 capsule 1  . venlafaxine XR (EFFEXOR-XR) 75 MG 24 hr capsule TAKE ONE CAPSULE EACH DAY WITH BREAKFAST 90 capsule 0  . Vitamin D, Ergocalciferol, (DRISDOL) 50000 units CAPS capsule Take 2,000 Units by mouth daily.     Marland Kitchen albuterol (VENTOLIN HFA) 108 (90 Base) MCG/ACT inhaler Inhale 2 puffs into the lungs every 6 (six) hours as needed for wheezing or shortness of breath. (Patient not taking: Reported on 02/05/2021) 18 g 5  . ALPRAZolam (XANAX) 1 MG tablet TAKE ONE TABLET AT BEDTIME AS NEEDED FOR SLEEP. Do not drive for 8 hours after taking 90 tablet 1  . budesonide-formoterol (SYMBICORT) 160-4.5 MCG/ACT inhaler Inhale 2 puffs into the lungs as needed. (Patient not taking: Reported on 02/05/2021)     No current facility-administered medications for this visit.     Objective:  BP 120/78 Comment: last home reading yesterday  Pulse 86   Temp 97.6 F (36.4 C) (Temporal)   Ht 5' (1.524 m)   Wt 172 lb 3.2 oz (78.1 kg)   SpO2 98%   BMI 33.63 kg/m  Gen: NAD, resting comfortably CV: RRR no murmurs rubs or gallops Lungs: CTAB no crackles, wheeze, rhonchi Abdomen: soft/nontender/nondistended/normal bowel sounds.  Ext: no edema Skin: warm, dry    Assessment and Plan    # social update- lost 3 friends since October including friend [name]- hard on her and they were younger than her  #Emergency room follow-up-patient with ED visit approximately a month ago presenting with frontal headache, left-sided neck pain, unsteady gait and numbness and tingling of all 10 fingertips.  Head CT with no  acute finding.  CT angiogram of the head and neck with 60% stenosis of proximal left internal carotid artery secondary to predominantly calcified atherosclerosis, aortic atherosclerosis also noted but otherwise largely reassuring.  MRI of the brain no acute or reversible findings-chronic small vessel ischemic changes noted.  No change from prior MRI April 2021.  MRI cervical spine with some arthritic changes and foraminal narrowing as well as facet joint arthritis in cervical spine. Did have a small raised area on her neck- sounds like this may have been a lymph node. Later got shingles which has resolved- likely a lymph node that has improved. Still getting some headaches- going to check with eye doctor soon   # Diabetes-diagnosed 08/07/2020 after having second A1c  6.5 or above S: Medication: None, currently diet controlled CBGs- does not want a meter yet Exercise and diet- set back after losing 3 friends- not exercising as much due to feeling down, has been stable on weight, planning on joining a gym Lab Results  Component Value Date   HGBA1C 6.7 (A) 02/05/2021  A/P: hopefully stable. Update a1c  #hypertension #Chronic diastolic CHF S: medication: Amlodipine 10 mg, irbesartan 300 mg, metoprolol 50 mg XR -Has required Lasix in the past for fluid overload -exercising helps her BP and needs to restart Home readings #s: 118-120/78-80 for most part BP Readings from Last 3 Encounters:  02/05/21 (!) 180/82 --> but 120/78 at home yesterday  01/27/21 (!) 132/102  01/02/21 (!) 168/70  A/P: good control at home. Poor control in office today initially but has not had meds yet- she will take these and let us know if not trending back down - For CHF- no signs of fluid overload- continue without lasix   #CAD - stable shortness of breath, no chest pain. Not associated with wheezing #hyperlipidemia with statin myalgia #Carotid stenosis-checked September each year by cardiology S: Medication: nexlizet  (bempedoic acid-ezetimibe), Lovaza, aspirin 325 mg -History of syncope related to ischemia -Still struggles with elevated triglycerides on this regimen  A/P: CAD appears stable- stable SOB. If worsening soudl follow with cardiology. Lipids have been reasonably controlled other than triglycerides up slightly- work on lifestyle. Carotid stenosis continue current meds and has check in september  # Asthma/allergies S: Maintenance Medication: Symbicort 160-4.5 2 puffs as needed in past but not wheezing.  For allergies-on Xyzal As needed medication: albuterol. Patient is using this rarely per week.  A/P: doing well recently- discussed if albuterol usage increases to twice a week can restart symbicort or if she gets a cold    #Depression-medication also helps with chronic pain/insomnia S: Medication: Effexor 75 mg extended release alprazolam 1 mg as needed before bed Depression screen [facility] 2/9 02/05/2021 08/16/2020 08/07/2020  Decreased Interest 0 0 0  Down, Depressed, Hopeless 0 0 0  PHQ - 2 Score 0 0 0  Altered sleeping 3 - 3  Tired, decreased energy 0 - 3  Change in appetite 3 - 0  Feeling bad or failure about yourself  0 - 0  Trouble concentrating 0 - 0  Moving slowly or fidgety/restless 0 - 0  Suicidal thoughts 0 - 0  PHQ-9 Score 6 - 6  Difficult doing work/chores Not difficult at all - Not difficult at all  Some recent data might be hidden  A/P: no anhedonia or depressed mood- will consider full remission. Continue effexor.   -refill alprazolam- not aving more dizziness or lightheadedness -discussed possibly changing to another med- has been able to wean from 2 mg- warned of memory risk.   Recommended follow up: Return in about 6 months (around 08/07/2021) for physical or sooner if needed. Future Appointments  Date Time Provider [place]  03/04/2021  2:30 PM [name], DO [facility] None  03/18/2021  9:00 AM [name], MD [facility]  06/27/2021  10:00 AM [facility] 3 [facility]  08/12/2021 10:40 AM Marin Olp, MD [facility]  08/22/2021  8:45 AM [facility] HEALTH COACH [facility]  01/06/2022  3:00 PM [place], Ronalee Red, PA-C [facility]    Lab/Order associations:   ICD-10-CM   1. Diabetes mellitus type 2 with complications (H[facility])  I09.7 POCT glycosylated hemoglobin (Hb A1C)  2. Chronic diastolic CHF (congestive heart  failure) ([facility]) Chronic I50.32   3. Essential hypertension  I10   4. Moderate persistent asthma without complication  O11.88   5. Hyperlipidemia, unspecified hyperlipidemia type  E78.5   6. Depression, unspecified depression type  F32.A   7. Myopathy, unspecified  G72.9     Meds ordered this encounter  Medications  . ALPRAZolam (XANAX) 1 MG tablet    Sig: TAKE ONE TABLET AT BEDTIME AS NEEDED FOR SLEEP. Do not drive for 8 hours after taking    Dispense:  90 tablet    Refill:  1   Return precautions advised.  Garret Reddish, MD

## 2021-02-05 ENCOUNTER — Other Ambulatory Visit: Payer: Self-pay

## 2021-02-05 ENCOUNTER — Encounter: Payer: Self-pay | Admitting: Family Medicine

## 2021-02-05 ENCOUNTER — Ambulatory Visit (INDEPENDENT_AMBULATORY_CARE_PROVIDER_SITE_OTHER): Payer: Medicare Other | Admitting: Family Medicine

## 2021-02-05 VITALS — BP 120/78 | HR 86 | Temp 97.6°F | Ht 60.0 in | Wt 172.2 lb

## 2021-02-05 DIAGNOSIS — I10 Essential (primary) hypertension: Secondary | ICD-10-CM

## 2021-02-05 DIAGNOSIS — E785 Hyperlipidemia, unspecified: Secondary | ICD-10-CM

## 2021-02-05 DIAGNOSIS — G729 Myopathy, unspecified: Secondary | ICD-10-CM

## 2021-02-05 DIAGNOSIS — E118 Type 2 diabetes mellitus with unspecified complications: Secondary | ICD-10-CM | POA: Insufficient documentation

## 2021-02-05 DIAGNOSIS — J4540 Moderate persistent asthma, uncomplicated: Secondary | ICD-10-CM

## 2021-02-05 DIAGNOSIS — I5032 Chronic diastolic (congestive) heart failure: Secondary | ICD-10-CM | POA: Diagnosis not present

## 2021-02-05 DIAGNOSIS — K219 Gastro-esophageal reflux disease without esophagitis: Secondary | ICD-10-CM

## 2021-02-05 DIAGNOSIS — E559 Vitamin D deficiency, unspecified: Secondary | ICD-10-CM

## 2021-02-05 DIAGNOSIS — F32A Depression, unspecified: Secondary | ICD-10-CM

## 2021-02-05 LAB — POCT GLYCOSYLATED HEMOGLOBIN (HGB A1C): Hemoglobin A1C: 6.7 % — AB (ref 4.0–5.6)

## 2021-02-05 MED ORDER — ALPRAZOLAM 1 MG PO TABS
ORAL_TABLET | ORAL | 1 refills | Status: DC
Start: 2021-02-05 — End: 2021-08-06

## 2021-02-12 ENCOUNTER — Other Ambulatory Visit: Payer: Self-pay | Admitting: Family Medicine

## 2021-03-04 ENCOUNTER — Ambulatory Visit: Payer: Medicare Other | Admitting: Family Medicine

## 2021-03-16 NOTE — Progress Notes (Signed)
Cardiology Office Note:    Date:  03/18/2021   ID:  Calie, Siegel 11-24-1945, MRN [id number]  PCP:  Marin Olp, MD   [facility] Providers Cardiologist:  Ena Dawley, MD (Inactive) {   Referring MD: Marin Olp, MD    History of Present Illness:    Felicia Perry is a 75 y.o. female with a hx of CAD (dx 2017 - RCA CTO s/p complex PCI, 60% LCx disease, normal LVEF [id number] diastolic CHF, carotid artery disease, HLD with statin intolerance,HTN, PUD, asthma, anxiety who was previously followed by Dr. Meda Coffee who now presents to clinic for follow-up of CAD and CHF.  Per review of the record, the patient was seen by Dr. Meda Coffee on 08/12/16 for evaluation of chest pain and a positive stress testwhich was ordered byPCP. Thisdemonstrated a hypotensive response to exerciseand amedium defect of moderate severity present in the basal inferoseptal, mid inferoseptal and apical septal and mid and apical inferior locationthat wasreversible and consistent with ischemia. LHC on 08/14/16 showed RCA CTO s/p complexPCI, 60% LCX disease managed medically with recs to consider staged PCI if continued symptoms.Myoview  01/2017 was low risk study. TTE 01/2017 showed LVEF of 55-60%, elevated filling pressure, mild to moderate AI.  Last myoview 02/15/20 with EF 71%, mild defect in apex but no ischemia. TTE 02/15/20 with LVEF 60-65%, severe LVH, mild MR, aortic sclerosis. Carotid 06/27/20 with RICA [id number] and LICA [id number].  Last saw Vin Bhagat on 03/06/20 where she was doing well from CV standpoint.  Today, the patient states that she is overall doing okay but has noticed some shortness of breath that has progressed that has worsened with the change of the season and a flare in her seasonal allergies. No chest discomfort. No lightheadedness, dizziness, LE edema, orthopnea or PND. She has been frustrated as she is not losing weight despite trying to eat healthy and be active (she  works full time cleaning houses). Has also been told she is pre-diabetic with A1C 6.6. Blood pressure is running 130-140s/70s. Tolerating all her medications but does not want anything additional as she already feels uncomfortable taking so many meds.  Past Medical History:  Diagnosis Date  . Adenomatous polyp 11/23/2005  . Anxiety   . Basal cell carcinoma 2021   right cheek per patient at [place] [facility]  . Chronic diastolic CHF (congestive heart failure) ([place])   . Coronary artery disease    a. HC on 08/14/16 showed RCA CTO s/p PCI, 60% LCX disease managed medically with recs to consider staged PCI if continued symptoms.   Marland Kitchen DIVERTICULOSIS, COLON 04/22/2007       . GERD (gastroesophageal reflux disease)   . History of shingles   . Hypertension   . Hypertensive heart disease   . Internal hemorrhoids   . Osteoarthritis    "knees, hands, lower back" (08/14/2016)  . PUD (peptic ulcer disease)   . Sleep apnea    a. resolved with weight loss    Past Surgical History:  Procedure Laterality Date  . BREAST EXCISIONAL BIOPSY    . BREAST SURGERY Left 1980s   Fibrous Tumors removed   . CARDIAC CATHETERIZATION N/A 08/14/2016   Procedure: Left Heart Cath and Coronary Angiography;  Surgeon: Sherren Mocha, MD;  Location: [facility];  Service: Cardiovascular;  Laterality: N/A;  . CARDIAC CATHETERIZATION N/A 08/14/2016   Procedure: Coronary Stent Intervention;  Surgeon: Sherren Mocha, MD;  Location: [facility];  Service: Cardiovascular;  Laterality:  N/A;  . CATARACT EXTRACTION W/ INTRAOCULAR LENS  IMPLANT, BILATERAL Bilateral 05/2009  . CORONARY ANGIOPLASTY    . detached retina left eye     may 2019  . TUBAL LIGATION  1970s    Current Medications: Current Meds  Medication Sig  . acetaminophen (TYLENOL) 325 MG tablet Take 650 mg by mouth every 6 (six) hours as needed.  Marland Kitchen albuterol (VENTOLIN HFA) 108 (90 Base) MCG/ACT inhaler Inhale 2 puffs into the lungs every 6 (six)  hours as needed for wheezing or shortness of breath.  . ALPRAZolam (XANAX) 1 MG tablet TAKE ONE TABLET AT BEDTIME AS NEEDED FOR SLEEP. Do not drive for 8 hours after taking  . amLODipine (NORVASC) 10 MG tablet TAKE ONE TABLET DAILY  . aspirin EC 325 MG tablet Take 1 tablet (325 mg total) by mouth daily.  . Bempedoic Acid-Ezetimibe (NEXLIZET) 180-10 MG TABS Take 1 tablet by mouth daily.  . budesonide-formoterol (SYMBICORT) 160-4.5 MCG/ACT inhaler Inhale 2 puffs into the lungs as needed.  . carvedilol (COREG) 6.25 MG tablet Take 1 tablet (6.25 mg total) by mouth 2 (two) times daily.  . famotidine (PEPCID) 20 MG tablet Take 1 tablet (20 mg total) by mouth 2 (two) times daily.  . irbesartan (AVAPRO) 300 MG tablet TAKE ONE TABLET DAILY  . levocetirizine (XYZAL) 5 MG tablet TAKE ONE TABLET EVERY MORNING  . Magnesium 200 MG TABS Take 1 tablet (200 mg total) by mouth daily.  Marland Kitchen omega-3 acid ethyl esters (LOVAZA) 1 g capsule TAKE TWO CAPSULES EVERY DAY  . venlafaxine XR (EFFEXOR-XR) 75 MG 24 hr capsule TAKE ONE CAPSULE EACH DAY WITH BREAKFAST  . Vitamin D, Ergocalciferol, (DRISDOL) 50000 units CAPS capsule Take 2,000 Units by mouth daily.   . [DISCONTINUED] metoprolol succinate (TOPROL-XL) 50 MG 24 hr tablet TAKE ONE TABLET DAILY WITH OR IMMEDIATELY FOLLOWING A MEAL     Allergies:   Celexa [citalopram hydrobromide], Cephalosporins, Clarithromycin, Doxycycline hyclate, Levofloxacin, Penicillins, Rosuvastatin, and Zolpidem tartrate   Social History   Socioeconomic History  . Marital status: Divorced    Spouse name: Not on file  . Number of children: Not on file  . Years of education: Not on file  . Highest education level: Not on file  Occupational History  . Occupation: Cleaning  Tobacco Use  . Smoking status: Former Smoker    Packs/day: 1.00    Years: 26.00    Pack years: 26.00    Types: Cigarettes    Quit date: 1989    Years since quitting: 33.4  . Smokeless tobacco: Never Used  Vaping  Use  . Vaping Use: Never used  Substance and Sexual Activity  . Alcohol use: No  . Drug use: No  . Sexual activity: Not Currently  Other Topics Concern  . Not on file  Social History Narrative   Divorced for 35 years in 2016. 2 sons- 1 son had been in prison now living with her in 2019.  2 granddaughters with 1 living close.        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 Best friend [name] patient of Dr. Yong Channel. Enjoys work, time on Teaching laboratory technician.    Social Determinants of Health   Financial Resource Strain: Low Risk   . Difficulty of Paying Living Expenses: Not hard at all  Food Insecurity: No Food Insecurity  .  Worried About Charity fundraiser in the Last Year: Never true  . Ran Out of Food in the Last Year: Never true  Transportation Needs: No Transportation Needs  . Lack of Transportation (Medical): No  . Lack of Transportation (Non-Medical): No  Physical Activity: Sufficiently Active  . Days of Exercise per Week: 5 days  . Minutes of Exercise per Session: 60 min  Stress: No Stress Concern Present  . Feeling of Stress : Not at all  Social Connections: Moderately Isolated  . Frequency of Communication with Friends and Family: More than three times a week  . Frequency of Social Gatherings with Friends and Family: Three times a week  . Attends Religious Services: More than 4 times per year  . Active Member of Clubs or Organizations: No  . Attends Archivist Meetings: Never  . Marital Status: Divorced     Family History: The patient's family history includes COPD in her father; Dementia in her maternal grandmother and mother; Diabetes in her mother; Heart disease in her mother; Hypertension in her mother. There is no history of Colon cancer.  ROS:   Please see the history of present illness.    Review of Systems  Constitutional:  Negative for chills, fever and weight loss.  HENT: Negative for hearing loss.   Eyes: Negative for blurred vision.  Respiratory: Positive for shortness of breath.   Cardiovascular: Negative for chest pain, palpitations, orthopnea, claudication, leg swelling and PND.  Gastrointestinal: Negative for nausea and vomiting.  Genitourinary: Negative for hematuria.  Musculoskeletal: Negative for falls.  Neurological: Negative for dizziness and loss of consciousness.  Endo/Heme/Allergies: Positive for environmental allergies.  Psychiatric/Behavioral: The patient is not nervous/anxious.     EKGs/Labs/Other Studies Reviewed:    The following studies were reviewed today: Myoview 02/15/20: 08/12/16 for evaluation of chest pain and a positive stress testwhich was ordered byPCP. Thisdemonstrated a hypotensive response to exerciseand amedium defect of moderate severity present in the basal inferoseptal, mid inferoseptal and apical septal and mid and apical inferior locationthat wasreversible and consistent with ischemia. LHC on 08/14/16 showed RCA CTO s/p complexPCI, 60% LCX disease managed medically with recs to consider staged PCI if continued symptoms.Last stress test 01/2017 was low risk study. Echo 01/2017 showed LVEF of 55-60%, elevated filling pressure, mild to moderate AI.  TTE 02/15/20: IMPRESSIONS  1. Left ventricular ejection fraction, by estimation, is 60 to 65%. The  left ventricle has normal function. The left ventricle has no regional  wall motion abnormalities. The left ventricular internal cavity size was  mildly dilated. There is severe left  ventricular hypertrophy. Left ventricular diastolic parameters were  normal.  2. Right ventricular systolic function is normal. The right ventricular  size is normal. There is mildly elevated pulmonary artery systolic  pressure.  3. The mitral valve is normal in structure. Mild mitral valve  regurgitation. No evidence of mitral  stenosis.  4. The aortic valve is tricuspid. Aortic valve regurgitation is mild.  Sclerosis with small gradient but no stenosis.  5. The inferior vena cava is normal in size with greater than 50%  respiratory variability, suggesting right atrial pressure of 3 mmHg.   Carotid 06/27/20: Summary:  Right Carotid: Velocities in the right ICA are consistent with a 1-39%  stenosis.   Left Carotid: Velocities in the left ICA are consistent with a 40-59%  stenosis.        Non-hemodynamically significant plaque <50% noted in the  CCA.   Vertebrals: Bilateral vertebral arteries  demonstrate antegrade flow.  Subclavians: Normal flow hemodynamics were seen in bilateral subclavian        arteries.   EKG:  No new tracing today  Recent Labs: 08/07/2020: TSH 1.38 01/02/2021: ALT 25; BUN 23; Creatinine, Ser 0.70; Hemoglobin 14.3; Platelets 307; Potassium 4.2; Sodium 141  Recent Lipid Panel    Component Value Date/Time   CHOL 112 08/07/2020 0845   CHOL 100 06/22/2019 0907   TRIG 320 (H) 08/07/2020 0845   HDL 30 (L) 08/07/2020 0845   HDL 32 (L) 06/22/2019 0907   CHOLHDL 3.7 08/07/2020 0845   VLDL 69.0 (H) 05/17/2019 1540   LDLCALC 48 08/07/2020 0845   LDLDIRECT 31 06/22/2019 0907   LDLDIRECT 76.0 05/17/2019 1540     Risk Assessment/Calculations:       Physical Exam:    VS:  BP (!) 142/84   Pulse 87   Ht 5' (1.524 m)   Wt 173 lb 12.8 oz (78.8 kg)   SpO2 98%   BMI 33.94 kg/m     Wt Readings from Last 3 Encounters:  03/18/21 173 lb 12.8 oz (78.8 kg)  02/05/21 172 lb 3.2 oz (78.1 kg)  01/27/21 173 lb (78.5 kg)     GEN:  Well nourished, well developed in no acute distress HEENT: Normal NECK: No JVD; No carotid bruits CARDIAC: RRR, no murmurs, rubs, gallops RESPIRATORY:  Clear to auscultation without rales, wheezing or rhonchi  ABDOMEN: Soft, non-tender, non-distended MUSCULOSKELETAL:  No edema; No deformity  SKIN: Warm and dry NEUROLOGIC:  Alert and  oriented x 3 PSYCHIATRIC:  Normal affect   ASSESSMENT:    1. Coronary artery disease involving native coronary artery of native heart without angina pectoris   2. Stenosis of carotid artery, unspecified laterality   3. Essential hypertension   4. Mixed hyperlipidemia   5. Chronic diastolic CHF (congestive heart failure) ([facility])   6. Statin intolerance   7. Shortness of breath   8. Obesity (BMI 30-39.9)    PLAN:    In order of problems listed above:  #Coronary Artery Disease: S/p LHC in 2017 with RCA CTO s/p complex PCI, 60% LCx disease. TTE 01/2020 with LVEF 60-65%. Myoview with apical infarct but no ischemia. No current anginal symptoms. -On ASA 81mg  daily (not on ASA 325mg  daily) -Continue irbesartan 300mg  daily -Change metop to coreg 6.25mg  BID for better blood pressure control -Intolerant to statins; current on nexlizet 180-10mg  daily  #Chronic Diastolic Heart Failure: Euvolemic with NYHA class II symptoms. TTE with LVEF 60-65%, severe LVH. -Not on diuretics -Declined starting farxiga and spironolactone today; will consider in the future -Continue irbesartan 300mg  daily -Change metop to coreg 6.25mg  BID as above -Low Na diet  #SOB: #Environmental allergies: Patient notes SOB while outside with change of season. Able to do all housework inside (works as a Scientist, product/process development) without issues. Recent myoview in 2021 without ischemia or infarction. TTE with LVEF 60-65% with no WMA. Given correlation with seasonal allergies, suspect symptoms are related to this. Will continue to monitor and if she notice more exertional symptoms, can pursue ischemic work-up at that time.   #HLD: #Statin Intolerance: LDL controlled at 48 on 08/07/20 -Continue nexlizet 180-10mg  daily  #Carotid Artery Disease: LICA with 70-96%, RICA with 1-39% 06/2020. -Continue nexlizet 180-10mg  daily -On ASA 81mg  daily (not on ASA 325mg  daily) -Repeat carotid ultrasound 06/2021  #HTN: Mildly elevated at home with goal  <120s/80s -Continue irbesartan 300mg  daily -Change metop XL 50mg  daily to coreg 6.25mg  BID -Continue amlodipine  10mg  daily  #Obesity with BMI 34: Discussed healthy diet and lifestyle at length today. Patient would like to try intermittent fasting with mediterranean diet.  Exercise recommendations: Goal of exercising for at least 30 minutes a day, at least 5 times per week.  Please exercise to a moderate exertion.  This means that while exercising it is difficult to speak in full sentences, however you are not so short of breath that you feel you must stop, and not so comfortable that you can carry on a full conversation.  Exertion level should be approximately a 5/10, if 10 is the most exertion you can perform.  Diet recommendations: Recommend a heart healthy diet such as the Mediterranean diet.  This diet consists of plant based foods, healthy fats, lean meats, olive oil.  It suggests limiting the intake of simple carbohydrates such as white breads, pastries, and pastas.  It also limits the amount of red meat, wine, and dairy products such as cheese that one should consume on a daily basis.     Medication Adjustments/Labs and Tests Ordered: Current medicines are reviewed at length with the patient today.  Concerns regarding medicines are outlined above.  No orders of the defined types were placed in this encounter.  Meds ordered this encounter  Medications  . carvedilol (COREG) 6.25 MG tablet    Sig: Take 1 tablet (6.25 mg total) by mouth 2 (two) times daily.    Dispense:  180 tablet    Refill:  3    Patient Instructions   Medication Instructions:   STOP TAKING METOPROLOL NOW  START TAKING CARVEDILOL (COREG) 6.25 MG BY MOUTH TWICE DAILY  *If you need a refill on your cardiac medications before your next appointment, please call your pharmacy*   Follow-Up:  4 MONTHS IN THE OFFICE WITH AN EXTENDER PER DR. Johney Frame     Other Instructions  PLEASE CONTINUE TO MONITOR AND  RECORD YOUR BLOOD PRESSURE AT HOME--YOU CAN SEND [place] SOME READINGS IF NEEDED VIA MYCHART--YOUR BP GOAL IS 120/80 AND LOWER    Mediterranean Diet A Mediterranean diet refers to food and lifestyle choices that are based on the traditions of countries located on the [employer]. This way of eating has been shown to help prevent certain conditions and improve outcomes for people who have chronic diseases, like kidney disease and heart disease. What are tips for following this plan? Lifestyle  Cook and eat meals together with your family, when possible.  Drink enough fluid to keep your urine clear or pale yellow.  Be physically active every day. This includes: ? Aerobic exercise like running or swimming. ? Leisure activities like gardening, walking, or housework.  Get 7-8 hours of sleep each night.  If recommended by your health care provider, drink red wine in moderation. This means 1 glass a day for nonpregnant women and 2 glasses a day for men. A glass of wine equals 5 oz (150 mL). Reading food labels  Check the serving size of packaged foods. For foods such as rice and pasta, the serving size refers to the amount of cooked product, not dry.  Check the total fat in packaged foods. Avoid foods that have saturated fat or trans fats.  Check the ingredients list for added sugars, such as corn syrup.   Shopping  At the grocery store, buy most of your food from the areas near the walls of the store. This includes: ? Fresh fruits and vegetables (produce). ? Grains, beans, nuts, and seeds. Some of  these may be available in unpackaged forms or large amounts (in bulk). ? Fresh seafood. ? Poultry and eggs. ? Low-fat dairy products.  Buy whole ingredients instead of prepackaged foods.  Buy fresh fruits and vegetables in-season from local farmers markets.  Buy frozen fruits and vegetables in resealable bags.  If you do not have access to quality fresh seafood, buy precooked frozen  shrimp or canned fish, such as tuna, salmon, or sardines.  Buy small amounts of raw or cooked vegetables, salads, or olives from the deli or salad bar at your store.  Stock your pantry so you always have certain foods on hand, such as olive oil, canned tuna, canned tomatoes, rice, pasta, and beans. Cooking  Cook foods with extra-virgin olive oil instead of using butter or other vegetable oils.  Have meat as a side dish, and have vegetables or grains as your main dish. This means having meat in small portions or adding small amounts of meat to foods like pasta or stew.  Use beans or vegetables instead of meat in common dishes like chili or lasagna.  Experiment with different cooking methods. Try roasting or broiling vegetables instead of steaming or sauteing them.  Add frozen vegetables to soups, stews, pasta, or rice.  Add nuts or seeds for added healthy fat at each meal. You can add these to yogurt, salads, or vegetable dishes.  Marinate fish or vegetables using olive oil, lemon juice, garlic, and fresh herbs. Meal planning  Plan to eat 1 vegetarian meal one day each week. Try to work up to 2 vegetarian meals, if possible.  Eat seafood 2 or more times a week.  Have healthy snacks readily available, such as: ? Vegetable sticks with hummus. ? [place] yogurt. ? Fruit and nut trail mix.  Eat balanced meals throughout the week. This includes: ? Fruit: 2-3 servings a day ? Vegetables: 4-5 servings a day ? Low-fat dairy: 2 servings a day ? Fish, poultry, or lean meat: 1 serving a day ? Beans and legumes: 2 or more servings a week ? Nuts and seeds: 1-2 servings a day ? Whole grains: 6-8 servings a day ? Extra-virgin olive oil: 3-4 servings a day  Limit red meat and sweets to only a few servings a month   What are my food choices?  Mediterranean diet ? Recommended  Grains: Whole-grain pasta. Brown rice. Bulgar wheat. Polenta. Couscous. Whole-wheat bread. [place].  Vegetables: Artichokes. Beets. Broccoli. Cabbage. Carrots. Eggplant. Green beans. Chard. Kale. Spinach. Onions. Leeks. Peas. Squash. Tomatoes. Peppers. Radishes.  Fruits: Apples. Apricots. Avocado. Berries. Bananas. Cherries. Dates. Figs. Grapes. Lemons. Melon. Oranges. Peaches. Plums. Pomegranate.  Meats and other protein foods: Beans. Almonds. Sunflower seeds. Pine nuts. Peanuts. Omega. Salmon. Scallops. Shrimp. [place]. Tilapia. Clams. Oysters. Eggs.  Dairy: Low-fat milk. Cheese. Greek yogurt.  Beverages: Water. Red wine. Herbal tea.  Fats and oils: Extra virgin olive oil. Avocado oil. Grape seed oil.  Sweets and desserts: [place] yogurt with honey. Baked apples. Poached pears. Trail mix.  Seasoning and other foods: Basil. Cilantro. Coriander. Cumin. Mint. Parsley. Sage. Rosemary. Tarragon. Garlic. Oregano. Thyme. Pepper. Balsalmic vinegar. Tahini. Hummus. Tomato sauce. Olives. Mushrooms. ? Limit these  Grains: Prepackaged pasta or rice dishes. Prepackaged cereal with added sugar.  Vegetables: Deep fried potatoes (french fries).  Fruits: Fruit canned in syrup.  Meats and other protein foods: Beef. Pork. Lamb. Poultry with skin. Hot dogs. Berniece Salines.  Dairy: Ice cream. Sour cream. Whole milk.  Beverages: Juice. Sugar-sweetened soft drinks. Beer. Liquor  and spirits.  Fats and oils: Butter. Canola oil. Vegetable oil. Beef fat (tallow). Lard.  Sweets and desserts: Cookies. Cakes. Pies. Candy.  Seasoning and other foods: Mayonnaise. Premade sauces and marinades. The items listed may not be a complete list. Talk with your dietitian about what dietary choices are right for you. Summary  The Mediterranean diet includes both food and lifestyle choices.  Eat a variety of fresh fruits and vegetables, beans, nuts, seeds, and whole grains.  Limit the amount of red meat and sweets that you eat.  Talk with your health care provider about whether it is safe for you to drink red wine in  moderation. This means 1 glass a day for nonpregnant women and 2 glasses a day for men. A glass of wine equals 5 oz (150 mL). This information is not intended to replace advice given to you by your health care provider. Make sure you discuss any questions you have with your health care provider. Document Revised: 06/11/2016 Document Reviewed: 06/04/2016 Elsevier Patient Education  2020 [place], [name], MD  03/18/2021 12:37 PM    [place]

## 2021-03-18 ENCOUNTER — Encounter: Payer: Self-pay | Admitting: Cardiology

## 2021-03-18 ENCOUNTER — Other Ambulatory Visit: Payer: Self-pay

## 2021-03-18 ENCOUNTER — Ambulatory Visit: Payer: Medicare Other | Admitting: Cardiology

## 2021-03-18 VITALS — BP 142/84 | HR 87 | Ht 60.0 in | Wt 173.8 lb

## 2021-03-18 DIAGNOSIS — E782 Mixed hyperlipidemia: Secondary | ICD-10-CM | POA: Diagnosis not present

## 2021-03-18 DIAGNOSIS — I10 Essential (primary) hypertension: Secondary | ICD-10-CM

## 2021-03-18 DIAGNOSIS — I6529 Occlusion and stenosis of unspecified carotid artery: Secondary | ICD-10-CM

## 2021-03-18 DIAGNOSIS — R0602 Shortness of breath: Secondary | ICD-10-CM

## 2021-03-18 DIAGNOSIS — Z789 Other specified health status: Secondary | ICD-10-CM

## 2021-03-18 DIAGNOSIS — E669 Obesity, unspecified: Secondary | ICD-10-CM

## 2021-03-18 DIAGNOSIS — I5032 Chronic diastolic (congestive) heart failure: Secondary | ICD-10-CM

## 2021-03-18 DIAGNOSIS — I2510 Atherosclerotic heart disease of native coronary artery without angina pectoris: Secondary | ICD-10-CM | POA: Diagnosis not present

## 2021-03-18 MED ORDER — CARVEDILOL 6.25 MG PO TABS: 6.2500 mg | ORAL_TABLET | Freq: Two times a day (BID) | ORAL | 3 refills | Status: DC

## 2021-03-18 NOTE — Patient Instructions (Addendum)
Medication Instructions:   STOP TAKING METOPROLOL NOW  START TAKING CARVEDILOL (COREG) 6.25 MG BY MOUTH TWICE DAILY  *If you need a refill on your cardiac medications before your next appointment, please call your pharmacy*   Follow-Up:  4 MONTHS IN THE OFFICE WITH AN EXTENDER PER DR. Johney Frame     Other Instructions  PLEASE CONTINUE TO MONITOR AND RECORD YOUR BLOOD PRESSURE AT HOME--YOU CAN SEND [place] SOME READINGS IF NEEDED VIA MYCHART--YOUR BP GOAL IS 120/80 AND LOWER    Mediterranean Diet A Mediterranean diet refers to food and lifestyle choices that are based on the traditions of countries located on the [employer]. This way of eating has been shown to help prevent certain conditions and improve outcomes for people who have chronic diseases, like kidney disease and heart disease. What are tips for following this plan? Lifestyle  Cook and eat meals together with your family, when possible.  Drink enough fluid to keep your urine clear or pale yellow.  Be physically active every day. This includes: ? Aerobic exercise like running or swimming. ? Leisure activities like gardening, walking, or housework.  Get 7-8 hours of sleep each night.  If recommended by your health care provider, drink red wine in moderation. This means 1 glass a day for nonpregnant women and 2 glasses a day for men. A glass of wine equals 5 oz (150 mL). Reading food labels  Check the serving size of packaged foods. For foods such as rice and pasta, the serving size refers to the amount of cooked product, not dry.  Check the total fat in packaged foods. Avoid foods that have saturated fat or trans fats.  Check the ingredients list for added sugars, such as corn syrup.   Shopping  At the grocery store, buy most of your food from the areas near the walls of the store. This includes: ? Fresh fruits and vegetables (produce). ? Grains, beans, nuts, and seeds. Some of these may be available in  unpackaged forms or large amounts (in bulk). ? Fresh seafood. ? Poultry and eggs. ? Low-fat dairy products.  Buy whole ingredients instead of prepackaged foods.  Buy fresh fruits and vegetables in-season from local farmers markets.  Buy frozen fruits and vegetables in resealable bags.  If you do not have access to quality fresh seafood, buy precooked frozen shrimp or canned fish, such as tuna, salmon, or sardines.  Buy small amounts of raw or cooked vegetables, salads, or olives from the deli or salad bar at your store.  Stock your pantry so you always have certain foods on hand, such as olive oil, canned tuna, canned tomatoes, rice, pasta, and beans. Cooking  Cook foods with extra-virgin olive oil instead of using butter or other vegetable oils.  Have meat as a side dish, and have vegetables or grains as your main dish. This means having meat in small portions or adding small amounts of meat to foods like pasta or stew.  Use beans or vegetables instead of meat in common dishes like chili or lasagna.  Experiment with different cooking methods. Try roasting or broiling vegetables instead of steaming or sauteing them.  Add frozen vegetables to soups, stews, pasta, or rice.  Add nuts or seeds for added healthy fat at each meal. You can add these to yogurt, salads, or vegetable dishes.  Marinate fish or vegetables using olive oil, lemon juice, garlic, and fresh herbs. Meal planning  Plan to eat 1 vegetarian meal one day each week. Try  to work up to 2 vegetarian meals, if possible.  Eat seafood 2 or more times a week.  Have healthy snacks readily available, such as: ? Vegetable sticks with hummus. ? [place] yogurt. ? Fruit and nut trail mix.  Eat balanced meals throughout the week. This includes: ? Fruit: 2-3 servings a day ? Vegetables: 4-5 servings a day ? Low-fat dairy: 2 servings a day ? Fish, poultry, or lean meat: 1 serving a day ? Beans and legumes: 2 or more servings  a week ? Nuts and seeds: 1-2 servings a day ? Whole grains: 6-8 servings a day ? Extra-virgin olive oil: 3-4 servings a day  Limit red meat and sweets to only a few servings a month   What are my food choices?  Mediterranean diet ? Recommended  Grains: Whole-grain pasta. Brown rice. Bulgar wheat. Polenta. Couscous. Whole-wheat bread. [place].  Vegetables: Artichokes. Beets. Broccoli. Cabbage. Carrots. Eggplant. Green beans. Chard. Kale. Spinach. Onions. Leeks. Peas. Squash. Tomatoes. Peppers. Radishes.  Fruits: Apples. Apricots. Avocado. Berries. Bananas. Cherries. Dates. Figs. Grapes. Lemons. Melon. Oranges. Peaches. Plums. Pomegranate.  Meats and other protein foods: Beans. Almonds. Sunflower seeds. Pine nuts. Peanuts. [place]. Salmon. Scallops. Shrimp. [place]. Tilapia. Clams. Oysters. Eggs.  Dairy: Low-fat milk. Cheese. Greek yogurt.  Beverages: Water. Red wine. Herbal tea.  Fats and oils: Extra virgin olive oil. Avocado oil. Grape seed oil.  Sweets and desserts: [place] yogurt with honey. Baked apples. Poached pears. Trail mix.  Seasoning and other foods: Basil. Cilantro. Coriander. Cumin. Mint. Parsley. Sage. Rosemary. Tarragon. Garlic. Oregano. Thyme. Pepper. Balsalmic vinegar. Tahini. Hummus. Tomato sauce. Olives. Mushrooms. ? Limit these  Grains: Prepackaged pasta or rice dishes. Prepackaged cereal with added sugar.  Vegetables: Deep fried potatoes (french fries).  Fruits: Fruit canned in syrup.  Meats and other protein foods: Beef. Pork. Lamb. Poultry with skin. Hot dogs. Bernie[place] Salines.  Dairy: Ice cream. Sour cream. Whole milk.  Beverages: Juice. Sugar-sweetened soft drinks. Beer. Liquor and spirits.  Fats and oils: Butter. Canola oil. Vegetable oil. Beef fat (tallow). Lard.  Sweets and desserts: Cookies. Cakes. Pies. Candy.  Seasoning and other foods: Mayonnaise. Premade sauces and marinades. The items listed may not be a complete list. Talk with your dietitian about  what dietary choices are right for you. Summary  The Mediterranean diet includes both food and lifestyle choices.  Eat a variety of fresh fruits and vegetables, beans, nuts, seeds, and whole grains.  Limit the amount of red meat and sweets that you eat.  Talk with your health care provider about whether it is safe for you to drink red wine in moderation. This means 1 glass a day for nonpregnant women and 2 glasses a day for men. A glass of wine equals 5 oz (150 mL). This information is not intended to replace advice given to you by your health care provider. Make sure you discuss any questions you have with your health care provider. Document Revised: 06/11/2016 Document Reviewed: 06/04/2016 Elsevier Patient Education  [place].

## 2021-03-27 ENCOUNTER — Telehealth: Payer: Self-pay

## 2021-03-27 NOTE — Telephone Encounter (Signed)
Patient states that she's treating her symptoms at home and she's beginning to feel a little better. She also refuses the virtual visit, But she did say that if she's not feel better by tomorrow she will go to [place] [facility] to be seen and she will give [place] a call back .

## 2021-03-27 NOTE — Telephone Encounter (Signed)
FYI

## 2021-03-27 NOTE — Telephone Encounter (Signed)
Nurse Assessment Nurse: Sherrell Puller, RN, Amy Date/Time Eilene Ghazi Time): 03/27/2021 9:39:24 AM Confirm and document reason for call. If symptomatic, describe symptoms. ---Caller states her at home Covid test was positive. Sore throat, body aches for 3 days and cough for 5 days. At first cough was dry and 3 days ago became a productive cough with yellow mucus. Temperature was 102 this morning and that's the highest it's been, she says Tylenol has been controlling it. Says when she gets up too fast she's been getting lightheaded. Does the patient have any new or worsening symptoms? ---Yes Will a triage be completed? ---Yes Related visit to physician within the last 2 weeks? ---No Does the PT have any chronic conditions? (i.e. diabetes, asthma, this includes High risk factors for pregnancy, etc.) ---Yes List chronic conditions. ---HTN, heart disease Is this a behavioral health or substance abuse call? ---No Guidelines Guideline Title Affirmed Question Affirmed Notes Nurse Date/Time ([place] Time) COVID-19 - Diagnosed or Suspected SEVERE or constant chest pain or pressure (Exception: Mild Ramsey, RN, Amy 03/27/2021 9:42:25 AM PLEASE NOTE: All timestamps contained within this report are represented as [place] Standard Time. CONFIDENTIALTY NOTICE: This fax transmission is intended only for the addressee. It contains information that is legally privileged, confidential or otherwise protected from use or disclosure. If you are not the intended recipient, you are strictly prohibited from reviewing, disclosing, copying using or disseminating any of this information or taking any action in reliance on or regarding this information. If you have received this fax in error, please notify us immediately by telephone so that we can arrange for its return to [place]. Phone: [phone], Toll-Free: [phone], Fax: [phone] Page: 2 of 2 Call Id: [id number] Guidelines Guideline Title Affirmed Question  Affirmed Notes Nurse Date/Time Eilene Ghazi Time) central chest pain, present only when coughing.) Disp. Time Eilene Ghazi Time) Disposition Final User 03/27/2021 9:49:35 AM Go to ED Now Yes Sherrell Puller, RN, Amy Caller Disagree/Comply Disagree Caller Understands Yes PreDisposition Did not know what to do Care Advice Given Per Guideline GO TO ED NOW: * You need to be seen in the Emergency Department. * Go to the ED at ___________ [place] now. Drive carefully. YOU SHOULD TELL HEALTHCARE PERSONNEL THAT YOU MIGHT HAVE COVID-19: * Tell the first healthcare worker you meet that you may have COVID-19. * Tell them you have symptoms and have been sent for COVID-19 testing. ANOTHER ADULT SHOULD DRIVE: * It is better and safer if another adult drives instead of you. CALL EMS 911 IF: * Severe difficulty breathing occurs * Lips or face turns blue * Confusion occurs. CARE ADVICE given per COVID-19 - DIAGNOSED OR SUSPECTED (Adult) guideline. Patient states she does not want to go to ER at this time and is wondering if the doctor may just call something in for her. This nurse explained office is now open and she could discuss further with her doctor. Nurse cold transferred patient to the office. Referrals GO TO FACILITY REFUSED

## 2021-03-27 NOTE — Telephone Encounter (Signed)
Patient refused ED- is she being set up for a virtual at least

## 2021-04-04 ENCOUNTER — Other Ambulatory Visit: Payer: Self-pay

## 2021-04-04 MED ORDER — NEXLIZET 180-10 MG PO TABS: 1.0000 | ORAL_TABLET | Freq: Every day | ORAL | 3 refills | Status: DC

## 2021-04-04 MED ORDER — AMLODIPINE BESYLATE 10 MG PO TABS: 1.0000 | ORAL_TABLET | Freq: Every day | ORAL | 3 refills | Status: DC

## 2021-04-07 ENCOUNTER — Ambulatory Visit: Payer: Medicare Other | Admitting: Family Medicine

## 2021-04-07 NOTE — Progress Notes (Deleted)
[name] Phone: [phone] Subjective:    I'm seeing this patient by the request  of:  Marin Olp, MD  CC:   [id number]  Felicia Perry is a 75 y.o. female coming in with complaint of back and neck pain Patient states   Medications patient has been prescribed:   Taking:         Reviewed prior external information including notes and imaging from previsou exam, outside providers and external EMR if available.   As well as notes that were available from care everywhere and other healthcare systems.  Past medical history, social, surgical and family history all reviewed in electronic medical record.  No pertanent information unless stated regarding to the chief complaint.   Past Medical History:  Diagnosis Date   Adenomatous polyp 11/23/2005   Anxiety    Basal cell carcinoma 2021   right cheek per patient at [facility]   Chronic diastolic CHF (congestive heart failure) (HCC)    Coronary artery disease    a. HC on 08/14/16 showed RCA CTO s/p PCI, 60% LCX disease managed medically with recs to consider staged PCI if continued symptoms.    DIVERTICULOSIS, COLON 04/22/2007        GERD (gastroesophageal reflux disease)    History of shingles    Hypertension    Hypertensive heart disease    Internal hemorrhoids    Osteoarthritis    "knees, hands, lower back" (08/14/2016)   PUD (peptic ulcer disease)    Sleep apnea    a. resolved with weight loss    Allergies  Allergen Reactions   Celexa [Citalopram Hydrobromide]     psychosis   Cephalosporins     REACTION: tongue swelling   Clarithromycin     REACTION: blisters in mouth   Doxycycline Hyclate     REACTION: nausea, vomiting   Levofloxacin     REACTION: tongue swells   Penicillins     REACTION: per patient causes rash,hives   Rosuvastatin Other (See Comments)    Pt reports causes bilateral lower extremity muscle aches.     Zolpidem Tartrate     REACTION: difficulty with concentration     Review of Systems:  No headache, visual changes, nausea, vomiting, diarrhea, constipation, dizziness, abdominal pain, skin rash, fevers, chills, night sweats, weight loss, swollen lymph nodes, body aches, joint swelling, chest pain, shortness of breath, mood changes. POSITIVE muscle aches  Objective  There were no vitals taken for this visit.   General: No apparent distress alert and oriented x3 mood and affect normal, dressed appropriately.  HEENT: Pupils equal, extraocular movements intact  Respiratory: Patient's speak in full sentences and does not appear short of breath  Cardiovascular: No lower extremity edema, non tender, no erythema  Neuro: Cranial nerves II through XII are intact, neurovascularly intact in all extremities with 2+ DTRs and 2+ pulses.  Gait normal with good balance and coordination.  MSK:  Non tender with full range of motion and good stability and symmetric strength and tone of shoulders, elbows, wrist, hip, knee and ankles bilaterally.  Back - Normal skin, Spine with normal alignment and no deformity.  No tenderness to vertebral process palpation.  Paraspinous muscles are not tender and without spasm.   Range of motion is full at neck and lumbar sacral regions  Osteopathic findings  C2 flexed rotated and side bent right C6 flexed rotated and side bent left T3 extended rotated and  side bent right inhaled rib T9 extended rotated and side bent left L2 flexed rotated and side bent right Sacrum right on right       Assessment and Plan:    Nonallopathic problems  Decision today to treat with OMT was based on Physical Exam  After verbal consent patient was treated with HVLA, ME, FPR techniques in cervical, rib, thoracic, lumbar, and sacral  areas  Patient tolerated the procedure well with improvement in symptoms  Patient given exercises, stretches and lifestyle modifications  See  medications in patient instructions if given  Patient will follow up in 4-8 weeks      The above documentation has been reviewed and is accurate and complete [name], DO       Note: This dictation was prepared with Dragon dictation along with smaller phrase technology. Any transcriptional errors that result from this process are unintentional.

## 2021-05-19 ENCOUNTER — Other Ambulatory Visit: Payer: Self-pay | Admitting: Family Medicine

## 2021-06-16 ENCOUNTER — Other Ambulatory Visit: Payer: Self-pay | Admitting: *Deleted

## 2021-06-16 DIAGNOSIS — Z789 Other specified health status: Secondary | ICD-10-CM

## 2021-06-16 DIAGNOSIS — I10 Essential (primary) hypertension: Secondary | ICD-10-CM

## 2021-06-16 DIAGNOSIS — E782 Mixed hyperlipidemia: Secondary | ICD-10-CM

## 2021-06-16 DIAGNOSIS — I25110 Atherosclerotic heart disease of native coronary artery with unstable angina pectoris: Secondary | ICD-10-CM

## 2021-06-16 MED ORDER — IRBESARTAN 300 MG PO TABS: 300.0000 mg | ORAL_TABLET | Freq: Every day | ORAL | 2 refills | Status: DC

## 2021-06-27 ENCOUNTER — Other Ambulatory Visit: Payer: Self-pay | Admitting: Cardiology

## 2021-06-27 ENCOUNTER — Other Ambulatory Visit: Payer: Self-pay

## 2021-06-27 ENCOUNTER — Ambulatory Visit (HOSPITAL_COMMUNITY)
Admission: RE | Admit: 2021-06-27 | Discharge: 2021-06-27 | Disposition: A | Discharge status: Home / Self Care | Payer: Medicare Other | Source: Ambulatory Visit | Attending: Cardiovascular Disease | Admitting: Cardiovascular Disease

## 2021-06-27 DIAGNOSIS — I6529 Occlusion and stenosis of unspecified carotid artery: Secondary | ICD-10-CM | POA: Insufficient documentation

## 2021-06-27 DIAGNOSIS — I6523 Occlusion and stenosis of bilateral carotid arteries: Secondary | ICD-10-CM | POA: Insufficient documentation

## 2021-06-30 NOTE — Progress Notes (Signed)
Cardiology Office Note:    Date:  07/01/2021   ID:  Felicia, Perry [birth year], MRN [id number]  PCP:  Felicia Olp, MD   [facility] Providers Cardiologist:  Felicia Bergeron, MD      Referring MD: Felicia Olp, MD   Chief Complaint:  F/u on CAD, HTN    Patient Profile:    Felicia Perry is a 75 y.o. female with:  Coronary artery disease  S/p DES x 3 to RCA (prox, mid and dist) in 10/17 LCx/OM mod dz (60%) - med Rx Myoview 4/21: low risk (HFpEF) heart failure with preserved ejection fraction  Echocardiogram 4/21: EF 60-65 Carotid artery dz [place] 9/22: R 1-39, L 40-59 Hypertension  Hyperlipidemia  Statin intol Pre-diabetes  PUD/GERD Asthma Anxiety OSA; resolved with weight loss   Prior CV studies: VAS US CAROTID DUPLEX BILATERAL 06/27/2021 Right Carotid: Velocities in the right ICA are consistent with a 1-39% stenosis. Left Carotid: Velocities in the left ICA are consistent with a 40-59% stenosis.  GATED SPECT MYO PERF W/LEXISCAN STRESS 1D 02/15/2020 Narrative  The left ventricular ejection fraction is hyperdynamic (>65%).  Nuclear stress EF: 71%.  There was no ST segment deviation noted during stress.  Defect 1: There is a small defect of mild severity present in the apex location.  The study is normal.  This is a low risk study.  Normal stress nuclear study with mild apical thinning but no ischemia.  Gated ejection fraction 71% with normal wall motion.  ECHOCARDIOGRAM 01/26/20 EF 60-65, no RWMA, severe LVH, normal RVSF, mildly elevated PASP, mild MR, mild AI  LEFT HEART CATH 08/14/2016 Narrative 1. Severe stenosis with chronic total occlusion of the mid right coronary artery, treated successfully with complex stenting as detailed (3 x 16 mm Synergy DES to pRCA, 2.5 x 38 mm Synergy DES to mRCA, 2.25 x 20 mm Synergy DES to dRCA) 2. Moderately severe stenosis of the mid left circumflex (60) involving a large obtuse marginal branch (50) 3.  Patency of the LAD with mild nonobstructive disease (mid 30) 4. Normal LV function by noninvasive assessment  Recommendations: Dual antiplatelet therapy with aspirin and brilinta for at least one year. Consider staged PCI of the left circumflex bifurcation if recurrent anginal symptoms arise.    History of Present Illness: [name] was last seen by Dr. Johney Perry in 5/22.  Metoprolol succinate was changed to carvedilol for better BP control.  The pt did complain of shortness of breath but this was felt to be due to allergies.  She returns for f/u.  She is here alone.  Overall, she is doing well.  She continues to work.  She has a housecleaning business.  She is able to exert herself during work without significant shortness of breath or chest pain.  She also rides a bike to the park and does stretches at home.  She only notices shortness of breath if she is at rest and gets up abruptly to do something.  She has occasional chest discomfort at rest.  This is nothing like what she had prior to her PCI.  She does note some relief with an acids.  She has not had orthopnea, syncope.  She has mild pedal edema on the left from time to time.        Past Medical History:  Diagnosis Date   Adenomatous polyp 11/23/2005   Anxiety    Basal cell carcinoma 2021   right cheek per patient at [facility] dermatology  Chronic diastolic CHF (congestive heart failure) (HCC)    Coronary artery disease    a. HC on 08/14/16 showed RCA CTO s/p PCI, 60% LCX disease managed medically with recs to consider staged PCI if continued symptoms.    DIVERTICULOSIS, COLON 04/22/2007        GERD (gastroesophageal reflux disease)    History of shingles    Hypertension    Hypertensive heart disease    Internal hemorrhoids    Osteoarthritis    "knees, hands, lower back" (08/14/2016)   PUD (peptic ulcer disease)    Sleep apnea    a. resolved with weight loss    Current Medications: Current Meds  Medication Sig   acetaminophen  (TYLENOL) 325 MG tablet Take 650 mg by mouth every 6 (six) hours as needed.   albuterol (VENTOLIN HFA) 108 (90 Base) MCG/ACT inhaler Inhale 2 puffs into the lungs every 6 (six) hours as needed for wheezing or shortness of breath.   ALPRAZolam (XANAX) 1 MG tablet TAKE ONE TABLET AT BEDTIME AS NEEDED FOR SLEEP. Do not drive for 8 hours after taking   amLODipine (NORVASC) 10 MG tablet Take 1 tablet (10 mg total) by mouth daily.   aspirin EC 81 MG tablet Take 1 tablet (81 mg total) by mouth daily. Swallow whole.   Bempedoic Acid-Ezetimibe (NEXLIZET) 180-10 MG TABS Take 1 tablet by mouth daily.   budesonide-formoterol (SYMBICORT) 160-4.5 MCG/ACT inhaler Inhale 2 puffs into the lungs as needed.   carvedilol (COREG) 6.25 MG tablet Take 1 tablet (6.25 mg total) by mouth 2 (two) times daily.   famotidine (PEPCID) 20 MG tablet TAKE ONE TABLET TWICE DAILY   irbesartan (AVAPRO) 300 MG tablet Take 1 tablet (300 mg total) by mouth daily.   levocetirizine (XYZAL) 5 MG tablet TAKE ONE TABLET EVERY MORNING   Magnesium 200 MG TABS Take 1 tablet (200 mg total) by mouth daily.   omega-3 acid ethyl esters (LOVAZA) 1 g capsule TAKE TWO CAPSULES EVERY DAY   venlafaxine XR (EFFEXOR-XR) 75 MG 24 hr capsule TAKE ONE CAPSULE EACH DAY WITH BREAKFAST   Vitamin D, Ergocalciferol, (DRISDOL) 50000 units CAPS capsule Take 2,000 Units by mouth daily.    [DISCONTINUED] aspirin EC 325 MG tablet Take 1 tablet (325 mg total) by mouth daily.     Allergies:   Celexa [citalopram hydrobromide], Cephalosporins, Clarithromycin, Doxycycline hyclate, Levofloxacin, Penicillins, Rosuvastatin, and Zolpidem tartrate   Social History   Tobacco Use   Smoking status: Former    Packs/day: 1.00    Years: 26.00    Pack years: 26.00    Types: Cigarettes    Quit date: 1989    Years since quitting: 33.7   Smokeless tobacco: Never  Vaping Use   Vaping Use: Never used  Substance Use Topics   Alcohol use: No   Drug use: No     Family  Hx: The patient's family history includes COPD in her father; Dementia in her maternal grandmother and mother; Diabetes in her mother; Heart disease in her mother; Hypertension in her mother. There is no history of Colon cancer.  Review of Systems  Gastrointestinal:  Positive for heartburn.    EKGs/Labs/Other Test Reviewed:    EKG:  EKG is not ordered today.  The ekg ordered today demonstrates n/a  Recent Labs: 08/07/2020: TSH 1.38 01/02/2021: ALT 25; BUN 23; Creatinine, Ser 0.70; Hemoglobin 14.3; Platelets 307; Potassium 4.2; Sodium 141   Recent Lipid Panel Lab Results  Component Value Date/Time   CHOL 112  08/07/2020 08:45 AM   CHOL 100 06/22/2019 09:07 AM   TRIG 320 (H) 08/07/2020 08:45 AM   HDL 30 (L) 08/07/2020 08:45 AM   HDL 32 (L) 06/22/2019 09:07 AM   LDLCALC 48 08/07/2020 08:45 AM   LDLDIRECT 31 06/22/2019 09:07 AM   LDLDIRECT 76.0 05/17/2019 03:40 PM      Risk Assessment/Calculations:          Physical Exam:    VS:  BP 118/74   Pulse 87   Ht 5' (1.524 m)   Wt 174 lb 3.2 oz (79 kg)   SpO2 98%   BMI 34.02 kg/m     Wt Readings from Last 3 Encounters:  07/01/21 174 lb 3.2 oz (79 kg)  03/18/21 173 lb 12.8 oz (78.8 kg)  02/05/21 172 lb 3.2 oz (78.1 kg)     Constitutional:      Appearance: Healthy appearance. Not in distress.  Neck:     Vascular: JVD normal.  Pulmonary:     Effort: Pulmonary effort is normal.     Breath sounds: No wheezing. No rales.  Cardiovascular:     Normal rate. Regular rhythm. Normal S1. Normal S2.      Murmurs: There is a grade 1/6 systolic murmur at the URSB.  Edema:    Peripheral edema absent.  Abdominal:     Palpations: Abdomen is soft.  Skin:    General: Skin is warm and dry.  Neurological:     Mental Status: Alert and oriented to person, place and time.     Cranial Nerves: Cranial nerves are intact.         ASSESSMENT & PLAN:    1. Coronary artery disease involving native coronary artery of native heart without  angina pectoris History of complex PCI with drug-eluting stent x3 to the RCA in 2017.  She had moderate residual disease in the LCx managed medically.  She had a low risk Myoview in 4/21.  She does have occasional chest discomfort.  However, this seems to be more consistent with acid reflux than angina.  I have asked her to review her chest symptoms with primary care when she is seen next.  She may need referral to gastroenterology.  Continue aspirin 81 mg daily, bempedoic acid/ezetimibe 180/10 mg daily, carvedilol 6.25 mg twice daily.  Follow-up in 6 months.  2. Chronic heart failure with preserved ejection fraction ([place]) EF 60-65 by echocardiogram 4/21.  She did have severe LVH.  Volume status is stable.  She is not on diuretic therapy.  Her shortness of breath seems to be more related to abrupt activity and not heart failure.  3. Bilateral carotid artery stenosis Carotid US 9/22 with moderate left ICA stenosis.  Continue aspirin 81 mg daily, bempedoic acid/ezetimibe 180/10 mg daily.  4. Essential hypertension Blood pressure is well controlled.  She notes slightly higher readings at home.  I have asked her to bring her cuff in with her to her next visit to have it checked.  Continue amlodipine 10 mg daily, carvedilol 6.25 mg twice daily, irbesartan 300 mg daily.  5. Mixed hyperlipidemia LDL in 10/21 was 48.  She is intolerant to statins.  Continue bempedoic acid/ezetimibe 180/10 mg daily.         Dispo:  Return in about 6 months (around 12/29/2021) for Routine Follow Up, w/ Dr. Johney Perry.   Medication Adjustments/Labs and Tests Ordered: Current medicines are reviewed at length with the patient today.  Concerns regarding medicines are outlined above.  Tests  Ordered: No orders of the defined types were placed in this encounter.  Medication Changes: Meds ordered this encounter  Medications   aspirin EC 81 MG tablet    Sig: Take 1 tablet (81 mg total) by mouth daily. Swallow whole.     Dispense:  90 tablet    Refill:  3     Signed, [name], PA-C  07/01/2021 8:42 AM    [address], [place], [place]  [address] Phone: [phone]; Fax: [phone]

## 2021-07-01 ENCOUNTER — Other Ambulatory Visit: Payer: Self-pay

## 2021-07-01 ENCOUNTER — Ambulatory Visit: Payer: Medicare Other | Admitting: Physician Assistant

## 2021-07-01 ENCOUNTER — Encounter: Payer: Self-pay | Admitting: Physician Assistant

## 2021-07-01 VITALS — BP 118/74 | HR 87 | Ht 60.0 in | Wt 174.2 lb

## 2021-07-01 DIAGNOSIS — I2510 Atherosclerotic heart disease of native coronary artery without angina pectoris: Secondary | ICD-10-CM | POA: Diagnosis not present

## 2021-07-01 DIAGNOSIS — I10 Essential (primary) hypertension: Secondary | ICD-10-CM | POA: Diagnosis not present

## 2021-07-01 DIAGNOSIS — I6523 Occlusion and stenosis of bilateral carotid arteries: Secondary | ICD-10-CM

## 2021-07-01 DIAGNOSIS — I5032 Chronic diastolic (congestive) heart failure: Secondary | ICD-10-CM

## 2021-07-01 DIAGNOSIS — E782 Mixed hyperlipidemia: Secondary | ICD-10-CM

## 2021-07-01 MED ORDER — ASPIRIN EC 81 MG PO TBEC: 81.0000 mg | DELAYED_RELEASE_TABLET | Freq: Every day | ORAL | 3 refills | Status: AC

## 2021-07-01 NOTE — Patient Instructions (Signed)
Medication Instructions:  DECREASE Asprin one tablet by mouth ( 81 mg ) daily, Over The Counter.   *If you need a refill on your cardiac medications before your next appointment, please call your pharmacy*  Lab Work:  -NONE  If you have labs (blood work) drawn today and your tests are completely normal, you will receive your results only by: [place] (if you have MyChart) OR A paper copy in the mail If you have any lab test that is abnormal or we need to change your treatment, we will call you to review the results.  Testing/Procedures:  -NONE  Follow-Up: At [facility], you and your health needs are our priority.  As part of our continuing mission to provide you with exceptional heart care, we have created designated Provider Care Teams.  These Care Teams include your primary Cardiologist (physician) and Advanced Practice Providers (APPs -  Physician Assistants and Nurse Practitioners) who all work together to provide you with the care you need, when you need it.  We recommend signing up for the patient portal called "MyChart".  Sign up information is provided on this After Visit Summary.  MyChart is used to connect with patients for Virtual Visits (Telemedicine).  Patients are able to view lab/test results, encounter notes, upcoming appointments, etc.  Non-urgent messages can be sent to your provider as well.   To learn more about what you can do with MyChart, go to [email or web address].    Your next appointment:   6 month(s)  The format for your next appointment:   In Person  Provider:   Gwyndolyn Kaufman, MD   Other Instructions -NONE

## 2021-07-08 ENCOUNTER — Telehealth: Payer: Self-pay

## 2021-07-08 NOTE — Telephone Encounter (Signed)
PA submitted on CoverMyMeds for Nexlizet.   [id number]   Your information has been submitted to [place]. [employer] [place] will review the request and notify you of the determination decision directly, typically within 3 business days of your submission and once all necessary information is received.  You will also receive your request decision electronically. To check for an update later, open the request again from your dashboard.  If [employer] has not responded within the specified timeframe or if you have any questions about your PA submission, contact [place] directly at [facility]) [phone] or ([place]) [phone].

## 2021-07-10 MED ORDER — EZETIMIBE 10 MG PO TABS: 10.0000 mg | ORAL_TABLET | Freq: Every day | ORAL | 11 refills | Status: DC

## 2021-07-10 NOTE — Telephone Encounter (Signed)
Pt called clinic, states Nexlizet is cost prohibitive at $90/month. She was previously in the [place], but this is currently closed. Will change to ezetimibe instead for cheaper copay, and will call pt if/when grant reopens and can change her back to Nexlizet at that time. She was appreciative for the assistance.

## 2021-07-10 NOTE — Addendum Note (Signed)
Addended by: Marva Hendryx E on: 07/10/2021 04:35 PM   Modules accepted: Orders

## 2021-07-21 DIAGNOSIS — H3589 Other specified retinal disorders: Secondary | ICD-10-CM | POA: Diagnosis not present

## 2021-07-21 DIAGNOSIS — Z961 Presence of intraocular lens: Secondary | ICD-10-CM | POA: Diagnosis not present

## 2021-07-21 LAB — HM DIABETES EYE EXAM

## 2021-07-31 ENCOUNTER — Telehealth: Payer: Self-pay | Admitting: Family Medicine

## 2021-07-31 NOTE — Chronic Care Management (AMB) (Signed)
  Care Management   Follow Up Note   07/31/2021 Name: SINIYAH EVANGELIST MRN: [id number] DOB: December 03, 1945   Referred by: Marin Olp, MD Reason for referral : No chief complaint on file.   An unsuccessful telephone outreach was attempted today. The patient was referred to the case management team for assistance with care management and care coordination.   Follow Up Plan: No further follow up required:    [place]

## 2021-07-31 NOTE — Chronic Care Management (AMB) (Signed)
  Care Management  Note   07/31/2021 Name: Felicia Perry MRN: [id number] DOB: 1946/04/07  Ival H Lietzke is a 75 y.o. year old female who is a primary care patient of Yong Channel, Brayton Mars, MD. The care management team was consulted for assistance with chronic disease management and care coordination needs.   Ms. Hewes was given information about Care Management services today including:  CCM service includes personalized support from designated clinical staff supervised by the physician, including individualized plan of care and coordination with other care providers 24/7 contact phone numbers for assistance for urgent and routine care needs. Service will only be billed when office clinical staff spend 20 minutes or more in a month to coordinate care. Only one practitioner may furnish and bill the service in a calendar month. The patient may stop CCM services at amy time (effective at the end of the month) by phone call to the office staff. The patient will be responsible for cost sharing (co-pay) or up to 20% of the service fee (after annual deductible is met)  Patient agreed to services and verbal consent obtained.  Follow up plan:   Face to Face appointment with care management team member scheduled for: 09/15/21 _0   Noelle Penner [employer] Scheduler

## 2021-08-06 ENCOUNTER — Other Ambulatory Visit: Payer: Self-pay | Admitting: Family Medicine

## 2021-08-06 NOTE — Telephone Encounter (Signed)
Last refill: 02/05/21 #90, 1 Last OV: 02/05/21 dx. DM, depression, GERD

## 2021-08-12 ENCOUNTER — Ambulatory Visit (INDEPENDENT_AMBULATORY_CARE_PROVIDER_SITE_OTHER): Payer: Medicare Other | Admitting: Family Medicine

## 2021-08-12 ENCOUNTER — Other Ambulatory Visit: Payer: Self-pay

## 2021-08-12 ENCOUNTER — Encounter: Payer: Self-pay | Admitting: Family Medicine

## 2021-08-12 VITALS — BP 136/78 | HR 82 | Temp 97.9°F | Ht 60.0 in | Wt 175.0 lb

## 2021-08-12 DIAGNOSIS — F32A Depression, unspecified: Secondary | ICD-10-CM

## 2021-08-12 DIAGNOSIS — E118 Type 2 diabetes mellitus with unspecified complications: Secondary | ICD-10-CM

## 2021-08-12 DIAGNOSIS — Z23 Encounter for immunization: Secondary | ICD-10-CM

## 2021-08-12 DIAGNOSIS — K219 Gastro-esophageal reflux disease without esophagitis: Secondary | ICD-10-CM

## 2021-08-12 DIAGNOSIS — Z0000 Encounter for general adult medical examination without abnormal findings: Secondary | ICD-10-CM

## 2021-08-12 DIAGNOSIS — I10 Essential (primary) hypertension: Secondary | ICD-10-CM | POA: Diagnosis not present

## 2021-08-12 DIAGNOSIS — E559 Vitamin D deficiency, unspecified: Secondary | ICD-10-CM

## 2021-08-12 DIAGNOSIS — E785 Hyperlipidemia, unspecified: Secondary | ICD-10-CM | POA: Diagnosis not present

## 2021-08-12 DIAGNOSIS — M85852 Other specified disorders of bone density and structure, left thigh: Secondary | ICD-10-CM

## 2021-08-12 DIAGNOSIS — G720 Drug-induced myopathy: Secondary | ICD-10-CM

## 2021-08-12 LAB — LDL CHOLESTEROL, DIRECT: Direct LDL: 80 mg/dL

## 2021-08-12 LAB — COMPREHENSIVE METABOLIC PANEL
ALT: 41 U/L — ABNORMAL HIGH (ref 0–35)
AST: 30 U/L (ref 0–37)
Albumin: 4.2 g/dL (ref 3.5–5.2)
Alkaline Phosphatase: 119 U/L — ABNORMAL HIGH (ref 39–117)
BUN: 23 mg/dL (ref 6–23)
CO2: 28 mEq/L (ref 19–32)
Calcium: 9.7 mg/dL (ref 8.4–10.5)
Chloride: 102 mEq/L (ref 96–112)
Creatinine, Ser: 0.85 mg/dL (ref 0.40–1.20)
GFR: 67.15 mL/min (ref >60.00)
Glucose, Bld: 192 mg/dL — ABNORMAL HIGH (ref 70–99)
Potassium: 4.3 mEq/L (ref 3.5–5.1)
Sodium: 139 mEq/L (ref 135–145)
Total Bilirubin: 0.4 mg/dL (ref 0.2–1.2)
Total Protein: 7.4 g/dL (ref 6.0–8.3)

## 2021-08-12 LAB — LIPID PANEL
Cholesterol: 143 mg/dL (ref 0–200)
HDL: 30 mg/dL — ABNORMAL LOW (ref >39.00)
NonHDL: 113.02
Total CHOL/HDL Ratio: 5
Triglycerides: 237 mg/dL — ABNORMAL HIGH (ref 0.0–149.0)
VLDL: 47.4 mg/dL — ABNORMAL HIGH (ref 0.0–40.0)

## 2021-08-12 LAB — CBC WITH DIFFERENTIAL/PLATELET
Basophils Absolute: 0.1 10*3/uL (ref 0.0–0.1)
Basophils Relative: 0.7 % (ref 0.0–3.0)
Eosinophils Absolute: 0.4 10*3/uL (ref 0.0–0.7)
Eosinophils Relative: 4.3 % (ref 0.0–5.0)
HCT: 40.6 % (ref 36.0–46.0)
Hemoglobin: 13.7 g/dL (ref 12.0–15.0)
Lymphocytes Relative: 24.2 % (ref 12.0–46.0)
Lymphs Abs: 2.1 10*3/uL (ref 0.7–4.0)
MCHC: 33.8 g/dL (ref 30.0–36.0)
MCV: 88 fl (ref 78.0–100.0)
Monocytes Absolute: 1 10*3/uL (ref 0.1–1.0)
Monocytes Relative: 11.3 % (ref 3.0–12.0)
Neutro Abs: 5.1 10*3/uL (ref 1.4–7.7)
Neutrophils Relative %: 59.5 % (ref 43.0–77.0)
Platelets: 298 10*3/uL (ref 150.0–400.0)
RBC: 4.61 Mil/uL (ref 3.87–5.11)
RDW: 12.3 % (ref 11.5–15.5)
WBC: 8.6 10*3/uL (ref 4.0–10.5)

## 2021-08-12 LAB — HEMOGLOBIN A1C: Hgb A1c MFr Bld: 7.5 % — ABNORMAL HIGH (ref 4.6–6.5)

## 2021-08-12 LAB — VITAMIN D 25 HYDROXY (VIT D DEFICIENCY, FRACTURES): VITD: 45.47 ng/mL (ref 30.00–100.00)

## 2021-08-12 MED ORDER — BUDESONIDE-FORMOTEROL FUMARATE 160-4.5 MCG/ACT IN AERO
2.0000 | INHALATION_SPRAY | Freq: Two times a day (BID) | RESPIRATORY_TRACT | 11 refills | Status: DC
Start: 2021-08-12 — End: 2023-11-23

## 2021-08-12 NOTE — Progress Notes (Signed)
Phone [phone]   Subjective:  Patient presents today for their annual physical. Chief complaint-noted.   See problem oriented charting- ROS- full  review of systems was completed and negative except for: appetite up at night, fatigue, weight up, sneezing, eye itching and redness, cough, wheezing, SOB, leg swelling, joint pain and swelling, neck pain and stiffness, dry skin, pale skin, seasonal allergies, headaches, confusion after covid- has nearly resolved, trouble with sleep  The following were reviewed and entered/updated in epic: Past Medical History:  Diagnosis Date   Adenomatous polyp 11/23/2005   Anxiety    Basal cell carcinoma 2021   right cheek per patient at [facility]   Chronic diastolic CHF (congestive heart failure) ([place])    Coronary artery disease    a. HC on 08/14/16 showed RCA CTO s/p PCI, 60% LCX disease managed medically with recs to consider staged PCI if continued symptoms.    DIVERTICULOSIS, COLON 04/22/2007        GERD (gastroesophageal reflux disease)    History of shingles    Hypertension    Hypertensive heart disease    Internal hemorrhoids    Osteoarthritis    "knees, hands, lower back" (08/14/2016)   PUD (peptic ulcer disease)    Sleep apnea    a. resolved with weight loss   Patient Active Problem List   Diagnosis Date Noted   Diabetes mellitus type 2 with complications ([place]) 61/95/0932    Priority: 1.   Carotid stenosis 01/23/2020    Priority: 1.   Chronic diastolic CHF (congestive heart failure) ([place]) 01/12/2019    Priority: 1.   Coronary artery disease of native artery of native heart with stable angina pectoris ([place])     Priority: 1.   Syncope 08/04/2016    Priority: 1.   statin myalgia 06/25/2020    Priority: 2.   Hyperlipidemia, unspecified 07/11/2018    Priority: 2.   H/O hyperparathyroidism 08/18/2016    Priority: 2.   Vitamin D deficiency 08/20/2015    Priority: 2.   Chronic pain syndrome 03/26/2015    Priority: 2.    Insomnia 07/13/2014    Priority: 2.   GERD (gastroesophageal reflux disease) 02/25/2011    Priority: 2.   Depression 04/22/2007    Priority: 2.   Essential hypertension 04/22/2007    Priority: 2.   Asthma 04/22/2007    Priority: 2.   Greater trochanteric bursitis of left hip 08/13/2020    Priority: 3.   Senile purpura ([place]) 08/07/2020    Priority: 3.   Degenerative arthritis of knee, bilateral 12/08/2018    Priority: 3.   Partial hamstring tear, initial encounter 11/16/2018    Priority: 3.   Nonallopathic lesion of sacral region 07/05/2018    Priority: 3.   Nonallopathic lesion of cervical region 07/05/2018    Priority: 3.   DDD (degenerative disc disease), cervical 05/04/2018    Priority: 3.   Degenerative arthritis of left knee 09/15/2017    Priority: 3.   PUD (peptic ulcer disease)     Priority: 3.   Hyperglycemia 08/06/2015    Priority: 3.   Xerosis of skin 08/06/2015    Priority: 3.   Hypersomnia with sleep apnea 03/26/2015    Priority: 3.   Nonallopathic lesion of lumbosacral region 11/13/2014    Priority: 3.   Osteoarthritis of left lower extremity 10/17/2014    Priority: 3.   Chronic meniscal tear of knee 10/17/2014    Priority: 3.   Nonallopathic lesion of thoracic region  09/14/2014    Priority: 3.   Nonallopathic lesion-rib cage 08/21/2014    Priority: 3.   Tendinopathy of right rotator cuff 07/30/2014    Priority: 3.   Piriformis syndrome of right side 07/30/2014    Priority: 3.   Limited joint range of motion 07/13/2014    Priority: 3.   Rash 07/13/2014    Priority: 3.   SI (sacroiliac) joint dysfunction 10/10/2007    Priority: 3.   LOW BACK PAIN 07/01/2007    Priority: 3.   Intractable headache 01/02/2021   Past Surgical History:  Procedure Laterality Date   BREAST EXCISIONAL BIOPSY     BREAST SURGERY Left 1980s   Fibrous Tumors removed    CARDIAC CATHETERIZATION N/A 08/14/2016   Procedure: Left Heart Cath and Coronary Angiography;   Surgeon: Sherren Mocha, MD;  Location: [facility];  Service: Cardiovascular;  Laterality: N/A;   CARDIAC CATHETERIZATION N/A 08/14/2016   Procedure: Coronary Stent Intervention;  Surgeon: Sherren Mocha, MD;  Location: [facility];  Service: Cardiovascular;  Laterality: N/A;   CATARACT EXTRACTION W/ INTRAOCULAR LENS  IMPLANT, BILATERAL Bilateral 05/2009   CORONARY ANGIOPLASTY     detached retina left eye     may 2019   TUBAL LIGATION  1970s    Family History  Problem Relation Age of Onset   Heart disease Mother    Hypertension Mother    Diabetes Mother    Dementia Mother    COPD Father    Dementia Maternal Grandmother    Colon cancer Neg Hx     Medications- reviewed and updated Current Outpatient Medications  Medication Sig Dispense Refill   acetaminophen (TYLENOL) 325 MG tablet Take 650 mg by mouth every 6 (six) hours as needed.     albuterol (VENTOLIN HFA) 108 (90 Base) MCG/ACT inhaler Inhale 2 puffs into the lungs every 6 (six) hours as needed for wheezing or shortness of breath. 18 g 5   ALPRAZolam (XANAX) 1 MG tablet TAKE ONE TABLET AT BEDTIME AS NEEDED FORSLEEP -DO NOT DRIVE FOR 8 HOURS AFTER TAKING 90 tablet 1   amLODipine (NORVASC) 10 MG tablet Take 1 tablet (10 mg total) by mouth daily. 90 tablet 3   aspirin EC 81 MG tablet Take 1 tablet (81 mg total) by mouth daily. Swallow whole. 90 tablet 3   carvedilol (COREG) 6.25 MG tablet Take 1 tablet (6.25 mg total) by mouth 2 (two) times daily. 180 tablet 3   ezetimibe (ZETIA) 10 MG tablet Take 1 tablet (10 mg total) by mouth daily. 30 tablet 11   famotidine (PEPCID) 20 MG tablet TAKE ONE TABLET TWICE DAILY 60 tablet 5   irbesartan (AVAPRO) 300 MG tablet Take 1 tablet (300 mg total) by mouth daily. 90 tablet 2   levocetirizine (XYZAL) 5 MG tablet TAKE ONE TABLET EVERY MORNING 90 tablet 0   Magnesium 200 MG TABS Take 1 tablet (200 mg total) by mouth daily. 30 each    nitroGLYCERIN (NITROSTAT) 0.4 MG SL tablet Place  1 tablet (0.4 mg total) under the tongue every 5 (five) minutes as needed for chest pain. 25 tablet 6   omega-3 acid ethyl esters (LOVAZA) 1 g capsule TAKE TWO CAPSULES EVERY DAY 180 capsule 1   venlafaxine XR (EFFEXOR-XR) 75 MG 24 hr capsule TAKE ONE CAPSULE EACH DAY WITH BREAKFAST 90 capsule 2   Vitamin D, Ergocalciferol, (DRISDOL) 50000 units CAPS capsule Take 2,000 Units by mouth daily.      budesonide-formoterol (SYMBICORT)  160-4.5 MCG/ACT inhaler Inhale 2 puffs into the lungs 2 (two) times daily. 1 each 11   No current facility-administered medications for this visit.    Allergies-reviewed and updated Allergies  Allergen Reactions   Celexa [Citalopram Hydrobromide]     psychosis   Cephalosporins     REACTION: tongue swelling   Clarithromycin     REACTION: blisters in mouth   Doxycycline Hyclate     REACTION: nausea, vomiting   Levofloxacin     REACTION: tongue swells   Penicillins     REACTION: per patient causes rash,hives   Rosuvastatin Other (See Comments)    Pt reports causes bilateral lower extremity muscle aches.    Zolpidem Tartrate     REACTION: difficulty with concentration    Social History   Social History Narrative   Divorced for 28 years in 2016. 2 sons- 1 son had been in prison now living with her in 2019.  2 granddaughters with 1 living close.        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 Best friend [name] patient of Dr. Yong Channel. Enjoys work, time on Teaching laboratory technician.    Objective  Objective:  BP 136/78   Pulse 82   Temp 97.9 F (36.6 C) (Temporal)   Ht 5' (1.524 m)   Wt 175 lb (79.4 kg)   SpO2 97%   BMI 34.18 kg/m  Gen: NAD, resting comfortably HEENT: Mucous membranes are moist. Oropharynx normal Neck: no thyromegaly CV: RRR no murmurs rubs or gallops Lungs: CTAB no crackles, wheeze, rhonchi Abdomen:  soft/nontender/nondistended/normal bowel sounds. No rebound or guarding.  Ext: no edema Skin: warm, dry Neuro: grossly normal, moves all extremities, PERRLA    Diabetic Foot Exam - Simple   Simple Foot Form Diabetic Foot exam was performed with the following findings: Yes 08/12/2021 11:05 AM  Visual Inspection No deformities, no ulcerations, no other skin breakdown bilaterally: Yes Sensation Testing Intact to touch and monofilament testing bilaterally: Yes Pulse Check Posterior Tibialis and Dorsalis pulse intact bilaterally: Yes Comments        Assessment and Plan   75 y.o. female presenting for annual physical.  Health Maintenance counseling: 1. Anticipatory guidance: Patient counseled regarding regular dental exams -q6 months-, eye exams -yearly,  avoiding smoking and second hand smoke- son smokes but in separate level of home - preferred to have him outside with her asthma , limiting alcohol to 1 beverage per day- doesn't drink typically, no illicit drugs.    2. Risk factor reduction:  Advised patient of need for regular exercise and diet rich and fruits and vegetables to reduce risk of heart attack and stroke. Exercise- active with work plus doing lazy exercise- set up in bed but gets HR up and works multiple muscle groups- doing for 2 weeks- trying to work her way up. Wants to get back to riding bike. Diet-her weight is up  by 3 lbs from last physical- she states evening appetite was higher after covid- doing more snacking than usual. Doing too much eating out.  Wt Readings from Last 3 Encounters:  08/12/21 175 lb (79.4 kg)  07/01/21 174 lb 3.2 oz (79 kg)  03/18/21 173 lb 12.8 oz (78.8 kg)  3. Immunizations/screenings/ancillary studies- discussed Shingrix- rec at pharmacy, Omicron/Bivalent booster-had covid back in June - rec at pharmacy , and Flu shot-high-dose  today- otherwise up-to-date. Immunization History  Administered Date(s) Administered   Fluad Quad(high Dose 65+)  07/25/2019, 08/07/2020, 08/12/2021   Influenza Split 09/21/2011, 07/07/2012   Influenza Whole 08/10/2008, 07/30/2010   Influenza, High Dose Seasonal PF 08/04/2016, 07/05/2018   Influenza,inj,Quad PF,6+ Mos 07/21/2013, 08/06/2015   Influenza-Unspecified 07/10/2014, 10/01/2017   PFIZER(Purple Top)SARS-COV-2 Vaccination 12/26/2019, 01/17/2020   Pneumococcal Conjugate-13 01/05/2014   Pneumococcal Polysaccharide-23 02/08/2015   Td 10/26/2001   Tetanus 01/05/2014   4. Cervical cancer screening- past age based screening recommendations-denies history of abnormal Pap smears. No blood or discharge from vagina.  5. Breast cancer screening- 11/22/20 with 1 year repeat planned 6. Colon cancer screening - last Cologaurd done 05/20/2018 with a 3-year repeat planned - overdue-this was ordered previously and she simply needs to send this then-discussed this would be her final Cologuard- got in mail last week 7. Skin cancer screening- planned to go see a dermatologist after last visit-she reports good report. advised regular sunscreen use. Denies worrisome, changing, or new skin lesions.  8. Birth control/STD check- postmenopausal/not sexually active.  Would use condoms if active- no unprotected sex  26. Osteoporosis screening at 89- last done on 10/04/2016 - patient had high calcium and osteopenia noted last year - she was taking vitamin D. Agrees to repeat DEXA 10. Smoking associated screening - former smoker - 26 pack years quit in 1980s.  No regular screening required  Status of chronic or acute concerns   #Social update-has lost 3 close friends in the last year including friend [name]- less activity outside of the home due to those losses -son will get married 2022 and move out- she is excited about that   # Diabetes-diagnosed 08/07/2020 after having second A1c 6.5 or above S: Medication: None, currently diet controlled CBGs- does not check Exercise and diet-see above Lab Results  Component  Value Date   HGBA1C 6.7 (A) 02/05/2021   HGBA1C 6.7 (H) 08/07/2020   HGBA1C 6.3 01/23/2020  A/P: Hopefully remains controlled-update A1c with labs today-for now continue without medication  #hypertension #Chronic diastolic CHF S: medication: Amlodipine 10 mg daily, irbesartan 300 mg daily, carvedilol 6.25 mg BID per cardiology -Has required Lasix in the past for fluid overload, cautious with HCTZ due to hercalcemia history Home readings #s: usually 120s/70s  Stable SOB but cardiology does not think CHF reltaed BP Readings from Last 3 Encounters:  08/12/21 136/78  07/01/21 118/74  03/18/21 (!) 142/84  A/P: Hypertension well-controlled-continue current medication.  CHF appears euvolemic-continue current medications  #CAD - no chest pain as long as takes pepcid BID. No SOB beyond baseline #hyperlipidemia with statin myalgia-drug-induced myopathy #Carotid stenosis-checked September each year by cardiology S: Medication: nexlizet (bempedoic acid-ezetimibe)- off for now and just on zetia until funding improves from company- should restart first of year, Lovaza, aspirin 325 mg -History of syncope related to ischemia -Still struggles with elevated triglycerides on this regimen Lab Results  Component Value Date   CHOL 112 08/07/2020   HDL 30 (L) 08/07/2020   LDLCALC 48 08/07/2020   LDLDIRECT 31 06/22/2019   TRIG 320 (H) 08/07/2020   CHOLHDL 3.7 08/07/2020  A/P: CAD-stable symptoms- continue aspirin and lipid control.  Carotids mild/stbale- continue current meds  -Lipids-hopefully stable or improved-ideally would see triglycerides lower-for now continue current medication-prefer nexlizet- hoping to get back on in january  # Asthma/allergies S: Maintenance Medication: Symbicort 160-4.52 puffs 2 puffs BID. For allergies-on Xyzal As needed medication: albuterol. Patient is using this 0x per week.  A/P:  Controlled. Continue current medications.  - allergies stable  onyzal  #Depression-medication also helps with chronic pain/insomnia S: Medication: Effexor 75 mg extended release  -Alprazolam 1 mg as needed before bed - reportshaving vivid dreams even with shorter naps Depression screen [facility] 2/9 08/12/2021 02/05/2021 08/16/2020  Decreased Interest 0 0 0  Down, Depressed, Hopeless 0 0 0  PHQ - 2 Score 0 0 0  Altered sleeping - 3 -  Tired, decreased energy - 0 -  Change in appetite - 3 -  Feeling bad or failure about yourself  - 0 -  Trouble concentrating - 0 -  Moving slowly or fidgety/restless - 0 -  Suicidal thoughts - 0 -  PHQ-9 Score - 6 -  Difficult doing work/chores - Not difficult at all -  Some recent data might be hidden  A/P: Despite losses in her cirle-no anhedonia or depressed mood-we will continue this med effexor- shed prefer to come off but knows symptoms may worsen - Alprazolam continues to help with sleep and no recent falls-we will continue-still weaned from peak medication 2 mg dose  #GERD S: Medication: Pepcid 20 mg twice daily. No issues as long as takes twice daily A/P: reasonable control lately as long as taking med- continue current meds   #Vitamin D deficiency S: Medication: Vitamin D/Drisdol 2k units daily Last vitamin D Lab Results  Component Value Date   VD25OH 25 08/07/2020  A/P: hopefully stable- update vitamin D today. Continue current meds for now   #Chronic pain/multiple orthopedic issues-follows closely with Dr. Charlann Boxer. Feeling better lately - will follow up as needed   Recommended follow up: No follow-ups on file. Future Appointments  Date Time Provider [place]  08/22/2021  8:45 AM [facility] HEALTH COACH [facility]  09/15/2021 11:00 AM [facility] CCM PHARMACIST [facility]  12/24/2021  8:00 AM [name], MD [facility]  01/06/2022  3:00 PM [place], Ronalee Red, PA-C [facility]   Lab/Order associations:NOT fasting   ICD-10-CM   1. Preventative health care  Z00.00      2. Hyperlipidemia, unspecified hyperlipidemia type  E78.5     3. Essential hypertension  I10     4. Depression, unspecified depression type  F32.A     5. Vitamin D deficiency  E55.9 Vitamin D (25 hydroxy)    6. Gastroesophageal reflux disease without esophagitis  K21.9     7. Need for immunization against influenza  Z23 Flu Vaccine QUAD High Dose(Fluad)    8. Drug-induced myopathy  G72.0     9. Osteopenia of left femoral neck  M85.852 DG Bone Density    10. Diabetes mellitus type 2 with complications (HCC)  F62.1 CBC with Differential/Platelet    Comprehensive metabolic panel    Lipid panel    Hemoglobin A1c      Meds ordered this encounter  Medications   budesonide-formoterol (SYMBICORT) 160-4.5 MCG/ACT inhaler    Sig: Inhale 2 puffs into the lungs 2 (two) times daily.    Dispense:  1 each    Refill:  11    I,Harris Phan,acting as a scribe for Garret Reddish, MD.,have documented all relevant documentation on the behalf of Garret Reddish, MD,as directed by  Garret Reddish, MD while in the presence of Garret Reddish, MD.   I, Garret Reddish, MD, have reviewed all documentation for this visit. The documentation on 08/12/21 for the exam, diagnosis, procedures, and orders are all accurate and complete.   Return precautions advised.  Garret Reddish, MD

## 2021-08-12 NOTE — Patient Instructions (Addendum)
Health Maintenance Due  Topic Date Due   OPHTHALMOLOGY EXAM  - If you have had your eye exam within the last year, please sign release of information at check out desk. If you have not had an eye exam within a year, please get one at this time as this is important for your diabetes care.   Never done   Zoster Vaccines- Shingrix (1 of 2) - Please check with your pharmacy to see if they have the shingrix vaccine. If they do- please get this immunization and update [place] by phone call or mychart with dates you receive the vaccine.  Never done   COVID-19 Vaccine (3 - Pfizer risk series) - Recommend getting Omicron/Bivalent booster only at your local pharmacy! Please let us know when you have received this vaccination.  02/14/2020   Fecal DNA (Cologuard)  - Patient has received her Cologuard and she plans on getting that done and shipped out this week.   05/20/2021   INFLUENZA VACCINE  - High dose flu shot done today before you leave.  05/26/2021   Please stop by lab before you go If you have mychart- we will send your results within 3 business days of [place] receiving them.  If you do not have mychart- we will call you about results within 5 business days of [place] receiving them.  *please also note that you will see labs on mychart as soon as they post. I will later go in and write notes on them- will say "notes from Dr. Yong Channel"  Stop by desk an schedule bone density.  I recommend trying the half-plate method as discussed today to help you cut back on your late night snacking.   Strongly recommend that you cut out fast food and unhealthy food choices completely.  I am glad to hear that you have started back on exercising in the past two weeks - please keep up the good work!  Please tell your son that I have asked for him to smoke outside in regards to your health!  Please try not to be so harsh on yourself about your weight- you are doing great and I recommend you look into joining some outlet  groups for self healing and future friendships.  During the Winter season, try to limit the amount of times that you shower per day - this will help prevent your skin form becoming dry. Eucerin and [employer] are great choices to apply to you skin as well.   Recommended follow up: Return in about 6 months (around 02/10/2022) for a follow-up or sooner if needed.

## 2021-08-18 ENCOUNTER — Other Ambulatory Visit (HOSPITAL_COMMUNITY): Payer: Self-pay | Admitting: Cardiology

## 2021-08-18 DIAGNOSIS — I6523 Occlusion and stenosis of bilateral carotid arteries: Secondary | ICD-10-CM

## 2021-08-19 ENCOUNTER — Other Ambulatory Visit: Payer: Medicare Other

## 2021-08-20 ENCOUNTER — Other Ambulatory Visit: Payer: Self-pay

## 2021-08-20 ENCOUNTER — Other Ambulatory Visit: Payer: Self-pay | Admitting: Family Medicine

## 2021-08-20 MED ORDER — OMEGA-3-ACID ETHYL ESTERS 1 G PO CAPS: ORAL_CAPSULE | ORAL | 3 refills | Status: AC

## 2021-08-22 ENCOUNTER — Ambulatory Visit: Payer: Medicare Other

## 2021-09-01 ENCOUNTER — Telehealth: Payer: Self-pay

## 2021-09-01 MED ORDER — NEXLIZET 180-10 MG PO TABS: 180.0000 mg | ORAL_TABLET | Freq: Every day | ORAL | 3 refills | Status: DC

## 2021-09-01 NOTE — Telephone Encounter (Signed)
Called pt and stated that they were approved for nexlizet, stop zetia because its included in the nexlizet pill. Pt voiced understanding.

## 2021-09-03 ENCOUNTER — Telehealth: Payer: Self-pay | Admitting: Family Medicine

## 2021-09-03 NOTE — Chronic Care Management (AMB) (Signed)
  Care Management  Note   09/03/2021 Name: Felicia Perry MRN: [id number] DOB: December 05, 1945  [name] is a 75 y.o. year old female who is a primary care patient of Yong Channel, Brayton Mars, MD. The care management team was consulted for assistance with chronic disease management and care coordination needs.   [name] was given information about Care Management services today including:  CCM service includes personalized support from designated clinical staff supervised by the physician, including individualized plan of care and coordination with other care providers 24/7 contact phone numbers for assistance for urgent and routine care needs. Service will only be billed when office clinical staff spend 20 minutes or more in a month to coordinate care. Only one practitioner may furnish and bill the service in a calendar month. The patient may stop CCM services at amy time (effective at the end of the month) by phone call to the office staff. The patient will be responsible for cost sharing (co-pay) or up to 20% of the service fee (after annual deductible is met)  Patient agreed to services and verbal consent obtained.  Follow up plan:   Face to Face appointment with care management team member scheduled for: 09/15/21 _0   Noelle Penner Upstream Scheduler

## 2021-09-09 ENCOUNTER — Telehealth: Payer: Self-pay | Admitting: Pharmacist

## 2021-09-09 NOTE — Progress Notes (Signed)
Chronic Care Management Pharmacy Note  09/16/2021 Name:  Felicia Perry MRN:  [id number] DOB:  July 23, 1946  Summary: Initial visit with PharmD.  Lots of time spent in education about carbohydrates in diet and effect on glucose.  Also patient admits she may be depressed today.  She does not feel Effexor is working for her.  She declined titration of the dose.  She wants to come off of this and try something else.  Recommendations/Changes made from today's visit: Recheck A1c first of the year - if elevated again could consider Farxiga/Metformin Consult PCP on depression - patient has tried many meds in the past   Plan: FU 3 months to assess DM and diet mods   Subjective: Felicia Perry is an 75 y.o. year old female who is a primary patient of Hunter, Brayton Mars, MD.  The CCM team was consulted for assistance with disease management and care coordination needs.    Engaged with patient face to face for initial visit in response to provider referral for pharmacy case management and/or care coordination services.   Consent to Services:  The patient was given the following information about Chronic Care Management services today, agreed to services, and gave verbal consent: 1. CCM service includes personalized support from designated clinical staff supervised by the primary care provider, including individualized plan of care and coordination with other care providers 2. 24/7 contact phone numbers for assistance for urgent and routine care needs. 3. Service will only be billed when office clinical staff spend 20 minutes or more in a month to coordinate care. 4. Only one practitioner may furnish and bill the service in a calendar month. 5.The patient may stop CCM services at any time (effective at the end of the month) by phone call to the office staff. 6. The patient will be responsible for cost sharing (co-pay) of up to 20% of the service fee (after annual deductible is met). Patient agreed to  services and consent obtained.  Patient Care Team: Marin Olp, MD as PCP - General (Family Medicine) [name], MD as PCP - Cardiology (Cardiology) [name], DO as Consulting Physician (Sports Medicine) Starlyn Skeans as Physician Assistant (Dermatology) Edythe Clarity, [facility] as Pharmacist (Pharmacist)  Recent office visits:  08/12/2021 OV (PCP) Marin Olp, MD; no medication changes indicated.   Recent consult visits:  07/01/2021 [facility] (cardiology) [name] T, PA-C; decrease Aspirin one tablet 81 mg daily.   03/18/2021 [facility] (cardiology( Johney Frame, Greer Ee, MD; -Change metop to coreg 6.54m BID for better blood pressure control. -Declined starting farxiga and spironolactone today; will consider in the future  Objective:  Lab Results  Component Value Date   CREATININE 0.85 08/12/2021   BUN 23 08/12/2021   GFR 67.15 08/12/2021   GFRNONAA >60 01/02/2021   GFRAA 74 08/07/2020   NA 139 08/12/2021   K 4.3 08/12/2021   CALCIUM 9.7 08/12/2021   CO2 28 08/12/2021   GLUCOSE 192 (H) 08/12/2021    Lab Results  Component Value Date/Time   HGBA1C 7.5 (H) 08/12/2021 11:46 AM   HGBA1C 6.7 (A) 02/05/2021 08:35 AM   HGBA1C 6.7 (H) 08/07/2020 08:45 AM   GFR 67.15 08/12/2021 11:46 AM   GFR 59.74 (L) 01/23/2020 08:41 AM    Last diabetic Eye exam: No results found for: HMDIABEYEEXA  Last diabetic Foot exam: No results found for: HMDIABFOOTEX   Lab Results  Component Value Date   CHOL 143 08/12/2021   HDL  30.00 (L) 08/12/2021   LDLCALC 48 08/07/2020   LDLDIRECT 80.0 08/12/2021   TRIG 237.0 (H) 08/12/2021   CHOLHDL 5 08/12/2021    Hepatic Function Latest Ref Rng & Units 08/12/2021 01/02/2021 08/07/2020  Total Protein 6.0 - 8.3 g/dL 7.4 7.7 7.3  Albumin 3.5 - 5.2 g/dL 4.2 4.3 -  AST 0 - 37 U/L _0 ALT 0 - 35 U/L 41(H) 25 21  Alk Phosphatase 39 - 117 U/L 119(H) 69 -  Total Bilirubin 0.2 - 1.2 mg/dL 0.4 0.9 0.8  Bilirubin, Direct  0.00 - 0.40 mg/dL - - -    Lab Results  Component Value Date/Time   TSH 1.38 08/07/2020 08:45 AM   TSH 1.37 10/20/2016 09:34 AM    CBC Latest Ref Rng & Units 08/12/2021 01/02/2021 01/02/2021  WBC 4.0 - 10.5 K/uL 8.6 - 8.1  Hemoglobin 12.0 - 15.0 g/dL 13.7 14.3 14.7  Hematocrit 36.0 - 46.0 % 40.6 42.0 44.0  Platelets 150.0 - 400.0 K/uL 298.0 - 307    Lab Results  Component Value Date/Time   VD25OH 45.47 08/12/2021 11:46 AM   VD25OH 61 08/07/2020 08:45 AM   VD25OH 62.76 07/25/2019 09:17 AM    Clinical ASCVD: Yes  The 10-year ASCVD risk score (Arnett DK, et al., 2019) is: 39.1%   Values used to calculate the score:     Age: 5 years     Sex: Female     Is Non-Hispanic African American: No     Diabetic: Yes     Tobacco smoker: No     Systolic Blood Pressure: 268 mmHg     Is BP treated: Yes     HDL Cholesterol: 30 mg/dL     Total Cholesterol: 143 mg/dL    Depression screen [facility] 2/9 08/12/2021 02/05/2021 08/16/2020  Decreased Interest 0 0 0  Down, Depressed, Hopeless 0 0 0  PHQ - 2 Score 0 0 0  Altered sleeping - 3 -  Tired, decreased energy - 0 -  Change in appetite - 3 -  Feeling bad or failure about yourself  - 0 -  Trouble concentrating - 0 -  Moving slowly or fidgety/restless - 0 -  Suicidal thoughts - 0 -  PHQ-9 Score - 6 -  Difficult doing work/chores - Not difficult at all -  Some recent data might be hidden    Social History   Tobacco Use  Smoking Status Former   Packs/day: 1.00   Years: 26.00   Pack years: 26.00   Types: Cigarettes   Quit date: 1989   Years since quitting: 33.9  Smokeless Tobacco Never   BP Readings from Last 3 Encounters:  08/12/21 136/78  07/01/21 118/74  03/18/21 (!) 142/84   Pulse Readings from Last 3 Encounters:  08/12/21 82  07/01/21 87  03/18/21 87   Wt Readings from Last 3 Encounters:  08/12/21 175 lb (79.4 kg)  07/01/21 174 lb 3.2 oz (79 kg)  03/18/21 173 lb 12.8 oz (78.8 kg)   BMI Readings from Last 3  Encounters:  08/12/21 34.18 kg/m  07/01/21 34.02 kg/m  03/18/21 33.94 kg/m    Assessment/Interventions: Review of patient past medical history, allergies, medications, health status, including review of consultants reports, laboratory and other test data, was performed as part of comprehensive evaluation and provision of chronic care management services.   SDOH:  (Social Determinants of Health) assessments and interventions performed: Yes  Financial Resource Strain: Low Risk    Difficulty of Paying  Living Expenses: Not very hard    SDOH Screenings   Alcohol Screen: Not on file  Depression (PHQ2-9): Low Risk    PHQ-2 Score: 0  Financial Resource Strain: Low Risk    Difficulty of Paying Living Expenses: Not very hard  Food Insecurity: Not on file  Housing: Not on file  Physical Activity: Not on file  Social Connections: Not on file  Stress: Not on file  Tobacco Use: Medium Risk   Smoking Tobacco Use: Former   Smokeless Tobacco Use: Never   Passive Exposure: Not on file  Transportation Needs: Not on file    CCM Care Plan  Allergies  Allergen Reactions   Celexa [Citalopram Hydrobromide]     psychosis   Cephalosporins     REACTION: tongue swelling   Clarithromycin     REACTION: blisters in mouth   Doxycycline Hyclate     REACTION: nausea, vomiting   Levofloxacin     REACTION: tongue swells   Penicillins     REACTION: per patient causes rash,hives   Rosuvastatin Other (See Comments)    Pt reports causes bilateral lower extremity muscle aches.    Zolpidem Tartrate     REACTION: difficulty with concentration    Medications Reviewed Today     Reviewed by Edythe Clarity, [facility] (Pharmacist) on 09/16/21 at [place] List Status: <None>   Medication Order Taking? Sig Documenting Provider Last Dose Status Informant  acetaminophen (TYLENOL) 325 MG tablet [id number] Yes Take 650 mg by mouth every 6 (six) hours as needed. [provider] Taking Active Self   albuterol (VENTOLIN HFA) 108 (90 Base) MCG/ACT inhaler [id number] Yes Inhale 2 puffs into the lungs every 6 (six) hours as needed for wheezing or shortness of breath. Marin Olp, MD Taking Active   ALPRAZolam Duanne Moron) 1 MG tablet [id number] Yes TAKE ONE TABLET AT BEDTIME AS NEEDED FORSLEEP -DO NOT DRIVE FOR 8 HOURS AFTER TAKING Marin Olp, MD Taking Active   amLODipine (NORVASC) 10 MG tablet [id number] Yes Take 1 tablet (10 mg total) by mouth daily. [name], MD Taking Active   aspirin EC 81 MG tablet [id number] Yes Take 1 tablet (81 mg total) by mouth daily. Swallow whole. [name] T, PA-C Taking Active   Bempedoic Acid-Ezetimibe (NEXLIZET) 180-10 MG TABS [id number] Yes Take 180 mg by mouth daily. [name], MD Taking Active   budesonide-formoterol [facility]) 160-4.5 MCG/ACT inhaler [id number] Yes Inhale 2 puffs into the lungs 2 (two) times daily. Marin Olp, MD Taking Active   carvedilol (COREG) 6.25 MG tablet [id number] Yes Take 1 tablet (6.25 mg total) by mouth 2 (two) times daily. [name], MD Taking Active   famotidine (PEPCID) 20 MG tablet [id number] Yes TAKE ONE TABLET TWICE DAILY Marin Olp, MD Taking Active   irbesartan (AVAPRO) 300 MG tablet [id number] Yes Take 1 tablet (300 mg total) by mouth daily. [name], MD Taking Active   levocetirizine (XYZAL) 5 MG tablet [id number] Yes TAKE ONE TABLET EVERY MORNING Marin Olp, MD Taking Active   Magnesium 200 MG TABS [id number] Yes Take 1 tablet (200 mg total) by mouth daily. Dorothy Spark, MD Taking Active   nitroGLYCERIN (NITROSTAT) 0.4 MG SL tablet [id number]  Place 1 tablet (0.4 mg total) under the tongue every 5 (five) minutes as needed for chest pain. Marin Olp, MD  Expired 08/12/21 2359   omega-3 acid ethyl esters (LOVAZA) 1 g capsule [id number] Yes TAKE TWO CAPSULES  EVERY DAY [name], MD Taking Active   venlafaxine XR (EFFEXOR-XR) 75 MG 24 hr  capsule [id number] Yes TAKE ONE CAPSULE EACH DAY WITH BREAKFAST Hulan Saas M, DO Taking Active   Vitamin D, Ergocalciferol, (DRISDOL) 50000 units CAPS capsule [id number] Yes Take 2,000 Units by mouth daily.  [provider] Taking Active             Patient Active Problem List   Diagnosis Date Noted   Diabetes mellitus type 2 with complications ([place]) 11/57/2620   Intractable headache 01/02/2021   Greater trochanteric bursitis of left hip 08/13/2020   Senile purpura ([place]) 08/07/2020   statin myalgia 06/25/2020   Carotid stenosis 01/23/2020   Chronic diastolic CHF (congestive heart failure) ([place]) 01/12/2019   Degenerative arthritis of knee, bilateral 12/08/2018   Partial hamstring tear, initial encounter 11/16/2018   Hyperlipidemia, unspecified 07/11/2018   Nonallopathic lesion of sacral region 07/05/2018   Nonallopathic lesion of cervical region 07/05/2018   DDD (degenerative disc disease), cervical 05/04/2018   Degenerative arthritis of left knee 09/15/2017   H/O hyperparathyroidism 08/18/2016   Coronary artery disease of native artery of native heart with stable angina pectoris ([place])    PUD (peptic ulcer disease)    Syncope 08/04/2016   Vitamin D deficiency 08/20/2015   Hyperglycemia 08/06/2015   Xerosis of skin 08/06/2015   Hypersomnia with sleep apnea 03/26/2015   Chronic pain syndrome 03/26/2015   Nonallopathic lesion of lumbosacral region 11/13/2014   Osteoarthritis of left lower extremity 10/17/2014   Chronic meniscal tear of knee 10/17/2014   Nonallopathic lesion of thoracic region 09/14/2014   Nonallopathic lesion-rib cage 08/21/2014   Tendinopathy of right rotator cuff 07/30/2014   Piriformis syndrome of right side 07/30/2014   Insomnia 07/13/2014   Limited joint range of motion 07/13/2014   Rash 07/13/2014   GERD (gastroesophageal reflux disease) 02/25/2011   SI (sacroiliac) joint dysfunction 10/10/2007   LOW BACK PAIN 07/01/2007   Depression  04/22/2007   Essential hypertension 04/22/2007   Asthma 04/22/2007    Immunization History  Administered Date(s) Administered   Fluad Quad(high Dose 65+) 07/25/2019, 08/07/2020, 08/12/2021   Influenza Split 09/21/2011, 07/07/2012   Influenza Whole 08/10/2008, 07/30/2010   Influenza, High Dose Seasonal PF 08/04/2016, 07/05/2018   Influenza,inj,Quad PF,6+ Mos 07/21/2013, 08/06/2015   Influenza-Unspecified 07/10/2014, 10/01/2017   PFIZER(Purple Top)SARS-COV-2 Vaccination 12/26/2019, 01/17/2020   Pneumococcal Conjugate-13 01/05/2014   Pneumococcal Polysaccharide-23 02/08/2015   Td 10/26/2001   Tetanus 01/05/2014    Conditions to be addressed/monitored:  HTN, CHF, CAD, Type II DM, Depression, Insomnia, HLD  Care Plan : General Pharmacy (Adult)  Updates made by Edythe Clarity, RPH since 09/16/2021 12:00 AM     Problem: HTN, CHF, CAD, Type II DM, Depression, Insomnia, HLD   Priority: High  Onset Date: 09/15/2021     Long-Range Goal: Patient-Specific Goal   Start Date: 09/15/2021  Expected End Date: 03/16/2022  This Visit's Progress: On track  Priority: High  Note:   Current Barriers:  Uncontrolled DM Possible untreated depression  Pharmacist Clinical Goal(s):  Patient will achieve improvement in A1c as evidenced by screening through collaboration with PharmD and provider.   Interventions: 1:1 collaboration with Marin Olp, MD regarding development and update of comprehensive plan of care as evidenced by provider attestation and co-signature Inter-disciplinary care team collaboration (see longitudinal plan of care) Comprehensive medication review performed; medication list updated in electronic medical record  Hypertension (BP goal <130/80) -Controlled -Current treatment: Irbesartan 361m daily Amlodipine  10mg  daily Carvedilol 6.25mg  BID -Medications previously tried: metoprolol, nadolol, hydralazine -Current home readings: normal at home -Current dietary  habits: depends on the day, some fast food, some home cooked, loves fruits and raw veggies -Current exercise habits: minimal lately - previously loved to ride bicycle.  Has recently started going to [employer] -Denies hypotensive/hypertensive symptoms -Educated on BP goals and benefits of medications for prevention of heart attack, stroke and kidney damage; Exercise goal of 150 minutes per week; Importance of home blood pressure monitoring; Symptoms of hypotension and importance of maintaining adequate hydration; -Counseled to monitor BP at home weekly, document, and provide log at future appointments -Recommended to continue current medication  Hyperlipidemia: (LDL goal < 70) -Not ideally controlled -Current treatment: Nexlizet 180-10mg  daily Lovazq 1g two capsules daily -Medications previously tried: statins (myalgais)  -Current dietary patterns: see DM -Current exercise habits: see HTN -Educated on Cholesterol goals;  Importance of limiting foods high in cholesterol; Exercise goal of 150 minutes per week; -Recommended to continue current medication LDL close to goal < 70. Increased exercise and diet changes should improve.  Getting through [employer], was recently just approved.  Will be getting through this for the next year.  Diabetes (A1c goal <7%) -Uncontrolled -Current medications: None at this time -Medications previously tried: none noted  -Current home glucose readings fasting glucose: not checking post prandial glucose: not checking -Denies hypoglycemic/hyperglycemic symptoms -Current meal patterns: she was eating meals high in carbohydrates.  Some days she cooks and some days she will get take out depending on how busy she is.  Currently living at her home with son and his fiance.  They will pick up fast food for her occasionally. drinks: water -Current exercise: minimal - [employer] a few times weekly -Educated on A1c and blood sugar goals; Exercise  goal of 150 minutes per week; Benefits of weight loss; Carbohydrate counting and/or plate method -Counseled to check feet daily and get yearly eye exams -Continue current management, lots of time spent today discussing diet (implications of carbs in blood sugar).  She really wants to fix this with diet and exercise.  Encouraged her this was possible with lifestyle mods.  Will follow closely to keep her on track. If meds needed in future would recommend Farxiga due to heart benefits.  Heart Failure (Goal: manage symptoms and prevent exacerbations) -Controlled -Last ejection fraction: >65% -HF type: Diastolic -Current treatment: Carvedilol 6.25mg  BID -Medications previously tried: metoprolol  -Current home BP/HR readings: normal -Current dietary habits: see DM -Current exercise habits: see DM -Educated on Benefits of medications for managing symptoms and prolonging life Importance of weighing daily; if you gain more than 3 pounds in one day or 5 pounds in one week, contact providers Importance of blood pressure control -Counseled on diet and exercise extensively Recommended to continue current medication Denies any swelling  Depression/Anxiety (Goal: Minimize symptoms) -Not ideally controlled -Current treatment: Venlafaxine ER 75mg  daily -Medications previously tried/failed: Wellbutrin, Celexa -PHQ9:  PHQ9 SCORE ONLY 08/12/2021 02/05/2021 08/16/2020  PHQ-9 Total Score 0 6 0  -GAD7: No flowsheet data found.  -Educated on Benefits of medication for symptom control Need to be in right mental frame of mind in order to take care of herself and make lifestyle mods.  Patient admits today that she may be a little depressed.  She does not feel like Effexor is working for her anymore and does not want to increase dose.  She reports stopping this med once and had horrible symptoms but she  did not have anything else to replace it.  Based on her history the only ones she has not tried would be  Lexapro, Zoloft, and Cymbalta. 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 -Will consult with PCP on next steps to treat depression, I feel if we can get this under control se may be more likely to make lifestyle mods to improve DM.   Patient Goals/Self-Care Activities Patient will:  - take medications as prescribed as evidenced by patient report and record review focus on medication adherence by own home system target a minimum of 150 minutes of moderate intensity exercise weekly Work to limit carbohydrates in diet  Follow Up Plan: The care management team will reach out to the patient again over the next 90 days.        Medication Assistance: None required.  Patient affirms current coverage meets needs.  Compliance/Adherence/Medication fill history: Care Gaps: Cologuard    Patient's preferred pharmacy is:  [facility], [place] - [address] [place] [address] Phone: [phone] Fax: [phone]  Uses pill box? No - own method of organization in cabinet Pt endorses 100% compliance  We discussed: Benefits of medication synchronization, packaging and delivery as well as enhanced pharmacist oversight with [employer]. Patient decided to: Continue current medication management strategy  Care Plan and Follow Up Patient Decision:  Patient agrees to Care Plan and Follow-up.  Plan: The care management team will reach out to the patient again over the next 90 days.  Beverly Milch, PharmD Clinical Pharmacist  [facility] [phone]

## 2021-09-09 NOTE — Chronic Care Management (AMB) (Signed)
Chronic Care Management Pharmacy Assistant   Name: Felicia Perry  MRN: [id number] DOB: [birth year]   Reason for Encounter: Chart Review For Initial Visit With Clinical Pharmacist   Conditions to be addressed/monitored: HTN, CHF, Asthma, DM II, DDD, Hyperglycemia, Vitamin D deficiency, HLD  Primary concerns for visit include: HTN, HLD, DM II  Recent office visits:  08/12/2021 [facility] (PCP) Marin Olp, MD; no medication changes indicated.  Recent consult visits:  07/01/2021 OV (cardiology) [name] T, PA-C; decrease Aspirin one tablet 81 mg daily.  03/18/2021 OV (cardiology( Pemberton, Greer Ee, MD; -Change metop to coreg 6.25mg  BID for better blood pressure control. -Declined starting farxiga and spironolactone today; will consider in the future  Hospital visits:  None in previous 6 months  Medications: Outpatient Encounter Medications as of 09/09/2021  Medication Sig   acetaminophen (TYLENOL) 325 MG tablet Take 650 mg by mouth every 6 (six) hours as needed.   albuterol (VENTOLIN HFA) 108 (90 Base) MCG/ACT inhaler Inhale 2 puffs into the lungs every 6 (six) hours as needed for wheezing or shortness of breath.   ALPRAZolam (XANAX) 1 MG tablet TAKE ONE TABLET AT BEDTIME AS NEEDED FORSLEEP -DO NOT DRIVE FOR 8 HOURS AFTER TAKING   amLODipine (NORVASC) 10 MG tablet Take 1 tablet (10 mg total) by mouth daily.   aspirin EC 81 MG tablet Take 1 tablet (81 mg total) by mouth daily. Swallow whole.   Bempedoic Acid-Ezetimibe (NEXLIZET) 180-10 MG TABS Take 180 mg by mouth daily.   budesonide-formoterol (SYMBICORT) 160-4.5 MCG/ACT inhaler Inhale 2 puffs into the lungs 2 (two) times daily.   carvedilol (COREG) 6.25 MG tablet Take 1 tablet (6.25 mg total) by mouth 2 (two) times daily.   famotidine (PEPCID) 20 MG tablet TAKE ONE TABLET TWICE DAILY   irbesartan (AVAPRO) 300 MG tablet Take 1 tablet (300 mg total) by mouth daily.   levocetirizine (XYZAL) 5 MG tablet TAKE ONE TABLET  EVERY MORNING   Magnesium 200 MG TABS Take 1 tablet (200 mg total) by mouth daily.   nitroGLYCERIN (NITROSTAT) 0.4 MG SL tablet Place 1 tablet (0.4 mg total) under the tongue every 5 (five) minutes as needed for chest pain.   omega-3 acid ethyl esters (LOVAZA) 1 g capsule TAKE TWO CAPSULES EVERY DAY   venlafaxine XR (EFFEXOR-XR) 75 MG 24 hr capsule TAKE ONE CAPSULE EACH DAY WITH BREAKFAST   Vitamin D, Ergocalciferol, (DRISDOL) 50000 units CAPS capsule Take 2,000 Units by mouth daily.    No facility-administered encounter medications on file as of 09/09/2021.   Current Medications: Bempedoic Acid-Ezetimibe 180-10 mg last filled 09/01/2021 90 DS Lovaza 1 g last filled 10/26/202290 DS Xyzal 5 mg last filled 08/20/2021 90 DS Symbicort 160-4.5 mcg/act inhaler last filled 08/12/2021 30 DS Alprazolam 1 mg last filled 08/06/2021 90 DS Aspirin 81 mg Irbesartan 300 mg last filled 06/16/2021 90 DS Famotidine 20 mg last filled 08/20/2021 30 DS Amlodipine 10 mg last filled 07/08/2021 90 DS Carvedilol 6.25 mg last filled 07/08/2021 90 DS Venlafaxine 75 mg last filled 05/19/2021 90 DS Albuterol 108 mcg/act Nitroglycerin 0.4 mg Magnesium 200 mg not taking at this time Vitamin D 5000 unit Acetaminophen 325 mg  Patient Questions: Any changes in your medications or health? Patient states she has not had any changes in her medications or health recently. She did mention that she hasn't had any energy since she had covid.  Any side effects from any medications?  Patient states she does not have any side  effects from her medications. However she thinks Effexor does not work like it used to.  Do you have any symptoms or problems not managed by your medications? Patient states she does not have any symptoms or problems that are not currently managed by her medications.  Any concerns about your health right now? Patient stated ":Well, yeah I have heart problems and high blood pressure."  Has your  provider asked that you check blood pressure, blood sugar, or follow special diet at home? Patient checks her blood pressure at home, she does not check her blood sugar. She does not follow any special diet. She tries to limit her portions, breads and sweets. She loves to eat fruits and vegetables.  Do you get any type of exercise on a regular basis? Patient stated "Not as much as I did before I had Covid. I was riding a bicycle everyday. 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  Can you think of a goal you would like to reach for your health? Patient stated "I just want to live as long as I can. I don't want to have diabetes." Patient states she is very concerned about her A1C. She states she would give up if she were to develop diabetes.  Lab Results  Component Value Date   HGBA1C 7.5 (H) 08/12/2021   Do you have any problems getting your medications? Patient denies having any problems getting her medications.  Is there anything that you would like to discuss during the appointment?  Patient states she does not have anything she would like to discuss.  Please bring medications and supplements to appointment   Care Gaps: Medicare Annual Wellness: Scheduled for 09/29/2021 Ophthalmology Exam: Overdue - never done Foot Exam: Next due on 08/12/2022 Hemoglobin A1C: 7.5% on 08/12/2021 Colonoscopy: last completed 11/23/2005 Fecal DNA (Cologuard): Overdue since 05/20/2021 Dexa Scan: Ordered on 08/12/2021 Mammogram: Completed 11/22/2020  Future Appointments  Date Time Provider [place]  09/15/2021 11:00 AM [facility] CCM PHARMACIST [facility]  09/29/2021  8:45 AM [facility] HEALTH COACH [facility]  12/10/2021  8:40 AM Marin Olp, MD [facility]  12/24/2021  8:00 AM [name], MD [facility]  01/06/2022  3:00 PM Warren Danes, PA-C [facility]  02/16/2022  9:20 AM Marin Olp, MD [facility]    Star Rating Drugs: Irbesartan 300 mg  last filled 06/16/2021 90 DS  April D Calhoun, [place] Pharmacist Assistant [phone]

## 2021-09-15 ENCOUNTER — Other Ambulatory Visit: Payer: Self-pay

## 2021-09-15 ENCOUNTER — Ambulatory Visit (INDEPENDENT_AMBULATORY_CARE_PROVIDER_SITE_OTHER): Payer: Medicare Other | Admitting: Pharmacist

## 2021-09-15 DIAGNOSIS — I5032 Chronic diastolic (congestive) heart failure: Secondary | ICD-10-CM

## 2021-09-15 DIAGNOSIS — E118 Type 2 diabetes mellitus with unspecified complications: Secondary | ICD-10-CM

## 2021-09-15 DIAGNOSIS — G729 Myopathy, unspecified: Secondary | ICD-10-CM

## 2021-09-15 DIAGNOSIS — I10 Essential (primary) hypertension: Secondary | ICD-10-CM

## 2021-09-15 DIAGNOSIS — F32A Depression, unspecified: Secondary | ICD-10-CM

## 2021-09-15 DIAGNOSIS — E785 Hyperlipidemia, unspecified: Secondary | ICD-10-CM

## 2021-09-16 NOTE — Patient Instructions (Signed)
Visit Information   Goals Addressed   None    There are no care plans to display for this patient.   Felicia Perry was given information about Chronic Care Management services today including:  CCM service includes personalized support from designated clinical staff supervised by her physician, including individualized plan of care and coordination with other care providers 24/7 contact phone numbers for assistance for urgent and routine care needs. Standard insurance, coinsurance, copays and deductibles apply for chronic care management only during months in which we provide at least 20 minutes of these services. Most insurances cover these services at 100%, however patients may be responsible for any copay, coinsurance and/or deductible if applicable. This service may help you avoid the need for more expensive face-to-face services. Only one practitioner may furnish and bill the service in a calendar month. The patient may stop CCM services at any time (effective at the end of the month) by phone call to the office staff.  Patient agreed to services and verbal consent obtained.   The patient verbalized understanding of instructions, educational materials, and care plan provided today and agreed to receive a mailed copy of patient instructions, educational materials, and care plan.  Telephone follow up appointment with pharmacy team member scheduled for: 3 months  Edythe Clarity, [place]

## 2021-09-24 DIAGNOSIS — F32A Depression, unspecified: Secondary | ICD-10-CM

## 2021-09-24 DIAGNOSIS — E1159 Type 2 diabetes mellitus with other circulatory complications: Secondary | ICD-10-CM

## 2021-09-24 DIAGNOSIS — I110 Hypertensive heart disease with heart failure: Secondary | ICD-10-CM

## 2021-09-24 DIAGNOSIS — E785 Hyperlipidemia, unspecified: Secondary | ICD-10-CM

## 2021-09-24 DIAGNOSIS — I5032 Chronic diastolic (congestive) heart failure: Secondary | ICD-10-CM

## 2021-09-29 ENCOUNTER — Other Ambulatory Visit: Payer: Self-pay

## 2021-09-29 ENCOUNTER — Ambulatory Visit (INDEPENDENT_AMBULATORY_CARE_PROVIDER_SITE_OTHER): Payer: Medicare Other

## 2021-09-29 VITALS — BP 130/82 | HR 96 | Temp 98.8°F

## 2021-09-29 DIAGNOSIS — Z0000 Encounter for general adult medical examination without abnormal findings: Secondary | ICD-10-CM

## 2021-09-29 NOTE — Progress Notes (Addendum)
Subjective:   Felicia Perry is a 75 y.o. female who presents for Medicare Annual (Subsequent) preventive examination.  Review of Systems     Cardiac Risk Factors include: advanced age (>73men, >7 women);diabetes mellitus;hypertension;obesity (BMI >30kg/m2);dyslipidemia     Objective:    Today's Vitals   09/29/21 0847  BP: 130/82  Pulse: 96  Temp: 98.8 F (37.1 C)  SpO2: 98%   There is no height or weight on file to calculate BMI.  Advanced Directives 09/29/2021 01/02/2021 08/16/2020 07/25/2019 04/21/2018 11/17/2016 08/14/2016  Does Patient Have a Medical Advance Directive? No No Yes No Yes No Yes  Type of Advance Directive - - - - - - -  Does patient want to make changes to medical advance directive? - - Yes (MAU/Ambulatory/Procedural Areas - Information given) Yes (MAU/Ambulatory/Procedural Areas - Information given) - - No - Patient declined  Copy of [place] in Chart? - - - - - - No - copy requested  Would patient like information on creating a medical advance directive? Yes (MAU/Ambulatory/Procedural Areas - Information given) - - - - - -    Current Medications (verified) Outpatient Encounter Medications as of 09/29/2021  Medication Sig   acetaminophen (TYLENOL) 325 MG tablet Take 650 mg by mouth every 6 (six) hours as needed.   albuterol (VENTOLIN HFA) 108 (90 Base) MCG/ACT inhaler Inhale 2 puffs into the lungs every 6 (six) hours as needed for wheezing or shortness of breath.   ALPRAZolam (XANAX) 1 MG tablet TAKE ONE TABLET AT BEDTIME AS NEEDED FORSLEEP -DO NOT DRIVE FOR 8 HOURS AFTER TAKING   amLODipine (NORVASC) 10 MG tablet Take 1 tablet (10 mg total) by mouth daily.   aspirin EC 81 MG tablet Take 1 tablet (81 mg total) by mouth daily. Swallow whole.   Bempedoic Acid-Ezetimibe (NEXLIZET) 180-10 MG TABS Take 180 mg by mouth daily.   budesonide-formoterol (SYMBICORT) 160-4.5 MCG/ACT inhaler Inhale 2 puffs into the lungs 2 (two) times daily.    carvedilol (COREG) 6.25 MG tablet Take 1 tablet (6.25 mg total) by mouth 2 (two) times daily.   famotidine (PEPCID) 20 MG tablet TAKE ONE TABLET TWICE DAILY   irbesartan (AVAPRO) 300 MG tablet Take 1 tablet (300 mg total) by mouth daily.   levocetirizine (XYZAL) 5 MG tablet TAKE ONE TABLET EVERY MORNING   Magnesium 200 MG TABS Take 1 tablet (200 mg total) by mouth daily.   omega-3 acid ethyl esters (LOVAZA) 1 g capsule TAKE TWO CAPSULES EVERY DAY   venlafaxine XR (EFFEXOR-XR) 75 MG 24 hr capsule TAKE ONE CAPSULE EACH DAY WITH BREAKFAST   Vitamin D, Ergocalciferol, (DRISDOL) 50000 units CAPS capsule Take 2,000 Units by mouth daily.    nitroGLYCERIN (NITROSTAT) 0.4 MG SL tablet Place 1 tablet (0.4 mg total) under the tongue every 5 (five) minutes as needed for chest pain.   No facility-administered encounter medications on file as of 09/29/2021.    Allergies (verified) Celexa [citalopram hydrobromide], Cephalosporins, Clarithromycin, Doxycycline hyclate, Levofloxacin, Penicillins, Rosuvastatin, and Zolpidem tartrate   History: Past Medical History:  Diagnosis Date   Adenomatous polyp 11/23/2005   Anxiety    Basal cell carcinoma 2021   right cheek per patient at [facility]   Chronic diastolic CHF (congestive heart failure) (HCC)    Coronary artery disease    a. HC on 08/14/16 showed RCA CTO s/p PCI, 60% LCX disease managed medically with recs to consider staged PCI if continued symptoms.    DIVERTICULOSIS, COLON 04/22/2007  GERD (gastroesophageal reflux disease)    History of shingles    Hypertension    Hypertensive heart disease    Internal hemorrhoids    Osteoarthritis    "knees, hands, lower back" (08/14/2016)   PUD (peptic ulcer disease)    Sleep apnea    a. resolved with weight loss   Past Surgical History:  Procedure Laterality Date   BREAST EXCISIONAL BIOPSY     BREAST SURGERY Left 1980s   Fibrous Tumors removed    CARDIAC CATHETERIZATION N/A 08/14/2016    Procedure: Left Heart Cath and Coronary Angiography;  Surgeon: Sherren Mocha, MD;  Location: [facility];  Service: Cardiovascular;  Laterality: N/A;   CARDIAC CATHETERIZATION N/A 08/14/2016   Procedure: Coronary Stent Intervention;  Surgeon: Sherren Mocha, MD;  Location: [facility];  Service: Cardiovascular;  Laterality: N/A;   CATARACT EXTRACTION W/ INTRAOCULAR LENS  IMPLANT, BILATERAL Bilateral 05/2009   CORONARY ANGIOPLASTY     detached retina left eye     may 2019   TUBAL LIGATION  1970s   Family History  Problem Relation Age of Onset   Heart disease Mother    Hypertension Mother    Diabetes Mother    Dementia Mother    COPD Father    Dementia Maternal Grandmother    Colon cancer Neg Hx    Social History   Socioeconomic History   Marital status: Divorced    Spouse name: Not on file   Number of children: Not on file   Years of education: Not on file   Highest education level: Not on file  Occupational History   Occupation: Cleaning  Tobacco Use   Smoking status: Former    Packs/day: 1.00    Years: 26.00    Pack years: 26.00    Types: Cigarettes    Quit date: 1989    Years since quitting: 33.9   Smokeless tobacco: Never  Vaping Use   Vaping Use: Never used  Substance and Sexual Activity   Alcohol use: No   Drug use: No   Sexual activity: Not Currently  Other Topics Concern   Not on file  Social History Narrative   Divorced for 2 years in 2016. 2 sons- 1 son had been in prison now living with her in 2019.  2 granddaughters with 1 living close.        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 Best friend [name] patient of Dr. Yong Channel. Enjoys work, time on Teaching laboratory technician.    Social Determinants of Health   Financial Resource Strain: Low Risk    Difficulty of Paying Living Expenses: Not hard at all  Food Insecurity:  No Food Insecurity   Worried About Charity fundraiser in the Last Year: Never true   [place] in the Last Year: Never true  Transportation Needs: No Transportation Needs   Lack of Transportation (Medical): No   Lack of Transportation (Non-Medical): No  Physical Activity: Sufficiently Active   Days of Exercise per Week: 2 days   Minutes of Exercise per Session: 120 min  Stress: Stress Concern Present   Feeling of Stress : To some extent  Social Connections: Moderately Isolated   Frequency of Communication with Friends and Family: More than three times a week   Frequency of Social Gatherings with Friends and  Family: More than three times a week   Attends Religious Services: More than 4 times per year   Active Member of Clubs or Organizations: No   Attends Archivist Meetings: Never   Marital Status: Divorced    Tobacco Counseling Counseling given: Not Answered   Clinical Intake:  Pre-visit preparation completed: Yes        BMI - recorded: 34.18 Nutritional Status: BMI > 30  Obese Nutritional Risks: None Diabetes: Yes CBG done?: No Did pt. bring in CBG monitor from home?: No  How often do you need to have someone help you when you read instructions, pamphlets, or other written materials from your doctor or pharmacy?: 1 - Never  Diabetic?Nutrition Risk Assessment:  Has the patient had any N/V/D within the last 2 months?  No  Does the patient have any non-healing wounds?  No  Has the patient had any unintentional weight loss or weight gain?  No  just eating more after covid working on exercise   Diabetes:  Is the patient diabetic?  Yes  If diabetic, was a CBG obtained today?  No  Did the patient bring in their glucometer from home?  No  How often do you monitor your CBG's? N/A.   Financial Strains and Diabetes Management:  Are you having any financial strains with the device, your supplies or your medication? No .  Does the patient want to be seen  by Chronic Care Management for management of their diabetes?  No  Would the patient like to be referred to a Nutritionist or for Diabetic Management?  No   Diabetic Exams:  Diabetic Eye Exam: Completed 07/21/21 Diabetic Foot Exam: Completed 08/12/21   Interpreter Needed?: No  Information entered by :: Charlott Rakes, LPN   Activities of Daily Living In your present state of health, do you have any difficulty performing the following activities: 09/29/2021  Hearing? N  Vision? N  Difficulty concentrating or making decisions? N  Walking or climbing stairs? N  Dressing or bathing? N  Doing errands, shopping? N  Preparing Food and eating ? N  Using the Toilet? N  In the past six months, have you accidently leaked urine? N  Do you have problems with loss of bowel control? N  Managing your Medications? N  Managing your Finances? N  Housekeeping or managing your Housekeeping? N  Some recent data might be hidden    Patient Care Team: Marin Olp, MD as PCP - General (Family Medicine) [name], MD as PCP - Cardiology (Cardiology) [name], DO as Consulting Physician (Sports Medicine) Starlyn Skeans as Physician Assistant (Dermatology) Edythe Clarity, [facility] as Pharmacist (Pharmacist)  Indicate any recent Medical Services you may have received from other than [facility] providers in the past year (date may be approximate).     Assessment:   This is a routine wellness examination for Sherre.  Hearing/Vision screen Hearing Screening - Comments:: Pt  denies any hearing issues  Vision Screening - Comments:: Pt follows up with [facility] Dr Alanda Slim   Dietary issues and exercise activities discussed: Current Exercise Habits: Home exercise routine, Type of exercise: Other - see comments (planet fitness), Time (Minutes): > 60, Frequency (Times/Week): 2, Weekly Exercise (Minutes/Week): 0   Goals Addressed             This Visit's Progress     Patient Stated       Lose 40 lbs       Depression  Screen PHQ 2/9 Scores 09/29/2021 08/12/2021 02/05/2021 08/16/2020 08/07/2020 01/23/2020 07/25/2019  PHQ - 2 Score 0 0 0 0 0 1 0  PHQ- 9 Score - - 6 - 6 4 -    Fall Risk Fall Risk  09/29/2021 08/12/2021 02/05/2021 08/16/2020 08/07/2020  Falls in the past year? 0 0 0 0 0  Number falls in past yr: 0 0 0 0 0  Injury with Fall? 0 0 0 0 0  Risk for fall due to : Impaired vision No Fall Risks - Impaired vision -  Follow up Falls prevention discussed Falls evaluation completed - Falls prevention discussed -    FALL RISK PREVENTION PERTAINING TO THE HOME:  Any stairs in or around the home? Yes  If so, are there any without handrails? No  Home free of loose throw rugs in walkways, pet beds, electrical cords, etc? Yes  Adequate lighting in your home to reduce risk of falls? Yes   ASSISTIVE DEVICES UTILIZED TO PREVENT FALLS:  Life alert? No  Use of a cane, walker or w/c? No  Grab bars in the bathroom? Yes  Shower chair or bench in shower? No  Elevated toilet seat or a handicapped toilet? No   TIMED UP AND GO:  Was the test performed? Yes .  Length of time to ambulate 10 feet: 10 sec.   Gait steady and fast without use of assistive device  Cognitive Function: MMSE - Mini Mental State Exam 07/25/2019 04/21/2018 04/21/2018  Not completed: - (No Data) (No Data)  Orientation to time 5 - -  Orientation to Place 5 - -  Registration 3 - -  Attention/ Calculation 5 - -  Recall 3 - -  Language- name 2 objects 2 - -  Language- repeat 1 - -  Language- follow 3 step command 3 - -  Language- read & follow direction 1 - -  Write a sentence 1 - -  Copy design 1 - -  Total score 30 - -     6CIT Screen 09/29/2021 08/16/2020  What Year? 0 points 0 points  What month? 0 points 0 points  What time? 0 points -  Count back from 20 0 points 0 points  Months in reverse 0 points 0 points  Repeat phrase 0 points 0 points  Total Score 0 -     Immunizations Immunization History  Administered Date(s) Administered   Fluad Quad(high Dose 65+) 07/25/2019, 08/07/2020, 08/12/2021   Influenza Split 09/21/2011, 07/07/2012   Influenza Whole 08/10/2008, 07/30/2010   Influenza, High Dose Seasonal PF 08/04/2016, 07/05/2018   Influenza,inj,Quad PF,6+ Mos 07/21/2013, 08/06/2015   Influenza-Unspecified 07/10/2014, 10/01/2017   PFIZER(Purple Top)SARS-COV-2 Vaccination 12/26/2019, 01/17/2020   Pneumococcal Conjugate-13 01/05/2014   Pneumococcal Polysaccharide-23 02/08/2015   Td 10/26/2001   Tetanus 01/05/2014    TDAP status: Up to date  Flu Vaccine status: Up to date  Pneumococcal vaccine status: Up to date  Covid-19 vaccine status: Completed vaccines  Qualifies for Shingles Vaccine? Yes   Zostavax completed No   Shingrix Completed?: No.    Education has been provided regarding the importance of this vaccine. Patient has been advised to call insurance company to determine out of pocket expense if they have not yet received this vaccine. Advised may also receive vaccine at local pharmacy or Health Dept. Verbalized acceptance and understanding.  Screening Tests Health Maintenance  Topic Date Due   Zoster Vaccines- Shingrix (1 of 2) Never done   COVID-19 Vaccine (3 - Pfizer risk series)  02/14/2020   Fecal DNA (Cologuard)  05/20/2021   HEMOGLOBIN A1C  02/10/2022   OPHTHALMOLOGY EXAM  07/21/2022   FOOT EXAM  08/12/2022   TETANUS/TDAP  01/06/2024   Pneumonia Vaccine 37+ Years old  Completed   INFLUENZA VACCINE  Completed   DEXA SCAN  Completed   Hepatitis C Screening  Completed   HPV VACCINES  Aged Out    Health Maintenance  Health Maintenance Due  Topic Date Due   Zoster Vaccines- Shingrix (1 of 2) Never done   COVID-19 Vaccine (3 - Pfizer risk series) 02/14/2020   Fecal DNA (Cologuard)  05/20/2021    Colorectal cancer screening: Type of screening: Cologuard. Completed 05/20/18. Repeat every 3 years  Mammogram  status: Completed 11/22/20. Repeat every year  Bone Density status: Completed 09/25/14. Results reflect: Bone density results: OSTEOPENIA. Repeat every 2 years.   Additional Screening:  Hepatitis C Screening: Completed 08/04/16  Vision Screening: Recommended annual ophthalmology exams for early detection of glaucoma and other disorders of the eye. Is the patient up to date with their annual eye exam?  Yes  Who is the provider or what is the name of the office in which the patient attends annual eye exams? Dr Alanda Slim  If pt is not established with a provider, would they like to be referred to a provider to establish care? No .   Dental Screening: Recommended annual dental exams for proper oral hygiene  Community Resource Referral / Chronic Care Management: CRR required this visit?  No   CCM required this visit?  No      Plan:     I have personally reviewed and noted the following in the patient's chart:   Medical and social history Use of alcohol, tobacco or illicit drugs  Current medications and supplements including opioid prescriptions.  Functional ability and status Nutritional status Physical activity Advanced directives List of other physicians Hospitalizations, surgeries, and ER visits in previous 12 months Vitals Screenings to include cognitive, depression, and falls Referrals and   In addition, I have reviewed and discussed with patient certain preventive protocols, quality metrics, and best practice recommendations. A written personalized care plan for preventive services as well as general preventive health recommendations were provided to patient.     Willette Brace, LPN   21/06/7587   Nurse Notes: None

## 2021-09-29 NOTE — Patient Instructions (Signed)
[name] , Thank you for taking time to come for your Medicare Wellness Visit. I appreciate your ongoing commitment to your health goals. Please review the following plan we discussed and let me know if I can assist you in the future.   Screening recommendations/referrals: Colonoscopy: Done cologuard 05/20/18 repeat eery 3 years  Mammogram: Done 11/22/20 repeat every year  Bone Density: Done 12//1//15 repeat every 2 years  Recommended yearly ophthalmology/optometry visit for glaucoma screening and checkup Recommended yearly dental visit for hygiene and checkup  Vaccinations: Influenza vaccine: Done 08/12/21 repeat every year  Pneumococcal vaccine: Up to date Tdap vaccine: Done 01/05/14 repeat every 10 years  Shingles vaccine: Shingrix discussed. Please contact your pharmacy for coverage information.    Covid-19:Completed 3/2 & 01/17/20  Advanced directives: Advance directive discussed with you today. I have provided a copy for you to complete at home and have notarized. Once this is complete please bring a copy in to our office so we can scan it into your chart.   Conditions/risks identified: Lose 40 lbs   Next appointment: Follow up in one year for your annual wellness visit    Preventive Care 67 Years and Older, Female Preventive care refers to lifestyle choices and visits with your health care provider that can promote health and wellness. What does preventive care include? A yearly physical exam. This is also called an annual well check. Dental exams once or twice a year. Routine eye exams. Ask your health care provider how often you should have your eyes checked. Personal lifestyle choices, including: Daily care of your teeth and gums. Regular physical activity. Eating a healthy diet. Avoiding tobacco and drug use. Limiting alcohol use. Practicing safe sex. Taking low-dose aspirin every day. Taking vitamin and mineral supplements as recommended by your health care  provider. What happens during an annual well check? The services and screenings done by your health care provider during your annual well check will depend on your age, overall health, lifestyle risk factors, and family history of disease. Counseling  Your health care provider may ask you questions about your: Alcohol use. Tobacco use. Drug use. Emotional well-being. Home and relationship well-being. Sexual activity. Eating habits. History of falls. Memory and ability to understand (cognition). Work and work Statistician. Reproductive health. Screening  You may have the following tests or measurements: Height, weight, and BMI. Blood pressure. Lipid and cholesterol levels. These may be checked every 5 years, or more frequently if you are over 49 years old. Skin check. Lung cancer screening. You may have this screening every year starting at age 36 if you have a 30-pack-year history of smoking and currently smoke or have quit within the past 15 years. Fecal occult blood test (FOBT) of the stool. You may have this test every year starting at age 61. Flexible sigmoidoscopy or colonoscopy. You may have a sigmoidoscopy every 5 years or a colonoscopy every 10 years starting at age 42. Hepatitis C blood test. Hepatitis B blood test. Sexually transmitted disease (STD) testing. Diabetes screening. This is done by checking your blood sugar (glucose) after you have not eaten for a while (fasting). You may have this done every 1-3 years. Bone density scan. This is done to screen for osteoporosis. You may have this done starting at age 36. Mammogram. This may be done every 1-2 years. Talk to your health care provider about how often you should have regular mammograms. Talk with your health care provider about your test results, treatment options, and if  necessary, the need for more tests. Vaccines  Your health care provider may recommend certain vaccines, such as: Influenza vaccine. This is  recommended every year. Tetanus, diphtheria, and acellular pertussis (Tdap, Td) vaccine. You may need a Td booster every 10 years. Zoster vaccine. You may need this after age 53. Pneumococcal 13-valent conjugate (PCV13) vaccine. One dose is recommended after age 79. Pneumococcal polysaccharide (PPSV23) vaccine. One dose is recommended after age 93. Talk to your health care provider about which screenings and vaccines you need and how often you need them. This information is not intended to replace advice given to you by your health care provider. Make sure you discuss any questions you have with your health care provider. Document Released: 11/08/2015 Document Revised: 07/01/2016 Document Reviewed: 08/13/2015 Elsevier Interactive Patient Education  2017 [place] Prevention in the Home Falls can cause injuries. They can happen to people of all ages. There are many things you can do to make your home safe and to help prevent falls. What can I do on the outside of my home? Regularly fix the edges of walkways and driveways and fix any cracks. Remove anything that might make you trip as you walk through a door, such as a raised step or threshold. Trim any bushes or trees on the path to your home. Use bright outdoor lighting. Clear any walking paths of anything that might make someone trip, such as rocks or tools. Regularly check to see if handrails are loose or broken. Make sure that both sides of any steps have handrails. Any raised decks and porches should have guardrails on the edges. Have any leaves, snow, or ice cleared regularly. Use sand or salt on walking paths during winter. Clean up any spills in your garage right away. This includes oil or grease spills. What can I do in the bathroom? Use night lights. Install grab bars by the toilet and in the tub and shower. Do not use towel bars as grab bars. Use non-skid mats or decals in the tub or shower. If you need to sit down in  the shower, use a plastic, non-slip stool. Keep the floor dry. Clean up any water that spills on the floor as soon as it happens. Remove soap buildup in the tub or shower regularly. Attach bath mats securely with double-sided non-slip rug tape. Do not have throw rugs and other things on the floor that can make you trip. What can I do in the bedroom? Use night lights. Make sure that you have a light by your bed that is easy to reach. Do not use any sheets or blankets that are too big for your bed. They should not hang down onto the floor. Have a firm chair that has side arms. You can use this for support while you get dressed. Do not have throw rugs and other things on the floor that can make you trip. What can I do in the kitchen? Clean up any spills right away. Avoid walking on wet floors. Keep items that you use a lot in easy-to-reach places. If you need to reach something above you, use a strong step stool that has a grab bar. Keep electrical cords out of the way. Do not use floor polish or wax that makes floors slippery. If you must use wax, use non-skid floor wax. Do not have throw rugs and other things on the floor that can make you trip. What can I do with my stairs? Do not leave any items  on the stairs. Make sure that there are handrails on both sides of the stairs and use them. Fix handrails that are broken or loose. Make sure that handrails are as long as the stairways. Check any carpeting to make sure that it is firmly attached to the stairs. Fix any carpet that is loose or worn. Avoid having throw rugs at the top or bottom of the stairs. If you do have throw rugs, attach them to the floor with carpet tape. Make sure that you have a light switch at the top of the stairs and the bottom of the stairs. If you do not have them, ask someone to add them for you. What else can I do to help prevent falls? Wear shoes that: Do not have high heels. Have rubber bottoms. Are comfortable  and fit you well. Are closed at the toe. Do not wear sandals. If you use a stepladder: Make sure that it is fully opened. Do not climb a closed stepladder. Make sure that both sides of the stepladder are locked into place. Ask someone to hold it for you, if possible. Clearly mark and make sure that you can see: Any grab bars or handrails. First and last steps. Where the edge of each step is. Use tools that help you move around (mobility aids) if they are needed. These include: Canes. Walkers. Scooters. Crutches. Turn on the lights when you go into a dark area. Replace any light bulbs as soon as they burn out. Set up your furniture so you have a clear path. Avoid moving your furniture around. If any of your floors are uneven, fix them. If there are any pets around you, be aware of where they are. Review your medicines with your doctor. Some medicines can make you feel dizzy. This can increase your chance of falling. Ask your doctor what other things that you can do to help prevent falls. This information is not intended to replace advice given to you by your health care provider. Make sure you discuss any questions you have with your health care provider. Document Released: 08/08/2009 Document Revised: 03/19/2016 Document Reviewed: 11/16/2014 Elsevier Interactive Patient Education  2017 [employer].

## 2021-10-22 NOTE — Progress Notes (Signed)
Felicia Perry [facility] [address] [place] Phone: [phone] Subjective:   [name], am serving as a scribe for Dr. Hulan Perry. This visit occurred during the SARS-CoV-2 public health emergency.  Safety protocols were in place, including screening questions prior to the visit, additional usage of staff PPE, and extensive cleaning of exam room while observing appropriate contact time as indicated for disinfecting solutions.   I'm seeing this patient by the request  of:  Felicia Olp, MD  CC: Neck pain, back pain, knee pain  [id number]  Felicia Perry is a 75 y.o. female coming in with complaint of B knee pain. Injections given 12/27/2019. Patient states wants injections in both knees. Right side of the neck having spasms for the past 3 weeks.  Patient has been doing a little bit more cleaning recently.  Has not had some increase in stress.  Patient now has not been as active doing the exercises either.       Past Medical History:  Diagnosis Date   Adenomatous polyp 11/23/2005   Anxiety    Basal cell carcinoma 2021   right cheek per patient at [facility]   Chronic diastolic CHF (congestive heart failure) (HCC)    Coronary artery disease    a. HC on 08/14/16 showed RCA CTO s/p PCI, 60% LCX disease managed medically with recs to consider staged PCI if continued symptoms.    DIVERTICULOSIS, COLON 04/22/2007        GERD (gastroesophageal reflux disease)    History of shingles    Hypertension    Hypertensive heart disease    Internal hemorrhoids    Osteoarthritis    "knees, hands, lower back" (08/14/2016)   PUD (peptic ulcer disease)    Sleep apnea    a. resolved with weight loss   Past Surgical History:  Procedure Laterality Date   BREAST EXCISIONAL BIOPSY     BREAST SURGERY Left 1980s   Fibrous Tumors removed    CARDIAC CATHETERIZATION N/A 08/14/2016   Procedure: Left Heart Cath and Coronary Angiography;   Surgeon: Sherren Mocha, MD;  Location: [facility];  Service: Cardiovascular;  Laterality: N/A;   CARDIAC CATHETERIZATION N/A 08/14/2016   Procedure: Coronary Stent Intervention;  Surgeon: Sherren Mocha, MD;  Location: [facility];  Service: Cardiovascular;  Laterality: N/A;   CATARACT EXTRACTION W/ INTRAOCULAR LENS  IMPLANT, BILATERAL Bilateral 05/2009   CORONARY ANGIOPLASTY     detached retina left eye     may 2019   TUBAL LIGATION  1970s   Social History   Socioeconomic History   Marital status: Divorced    Spouse name: Not on file   Number of children: Not on file   Years of education: Not on file   Highest education level: Not on file  Occupational History   Occupation: Cleaning  Tobacco Use   Smoking status: Former    Packs/day: 1.00    Years: 26.00    Pack years: 26.00    Types: Cigarettes    Quit date: 1989    Years since quitting: 34.0   Smokeless tobacco: Never  Vaping Use   Vaping Use: Never used  Substance and Sexual Activity   Alcohol use: No   Drug use: No   Sexual activity: Not Currently  Other Topics Concern   Not on file  Social History Narrative   Divorced for 62 years in 2016. 2 sons- 1 son had been in prison now  living with her in 2019.  2 granddaughters with 1 living close.        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 Best friend Felicia Perry patient of Dr. Yong Perry. Enjoys work, time on Teaching laboratory technician.    Social Determinants of Health   Financial Resource Strain: Low Risk    Difficulty of Paying Living Expenses: Not hard at all  Food Insecurity: No Food Insecurity   Worried About Charity fundraiser in the Last Year: Never true   [name] in the Last Year: Never true  Transportation Needs: No Transportation Needs   Lack of Transportation (Medical): No   Lack of Transportation (Non-Medical): No   Physical Activity: Sufficiently Active   Days of Exercise per Week: 2 days   Minutes of Exercise per Session: 120 min  Stress: Stress Concern Present   Feeling of Stress : To some extent  Social Connections: Moderately Isolated   Frequency of Communication with Friends and Family: More than three times a week   Frequency of Social Gatherings with Friends and Family: More than three times a week   Attends Religious Services: More than 4 times per year   Active Member of [employer] or Organizations: No   Attends Archivist Meetings: Never   Marital Status: Divorced   Allergies  Allergen Reactions   Celexa [Citalopram Hydrobromide]     psychosis   Cephalosporins     REACTION: tongue swelling   Clarithromycin     REACTION: blisters in mouth   Doxycycline Hyclate     REACTION: nausea, vomiting   Levofloxacin     REACTION: tongue swells   Penicillins     REACTION: per patient causes rash,hives   Rosuvastatin Other (See Comments)    Pt reports causes bilateral lower extremity muscle aches.    Zolpidem Tartrate     REACTION: difficulty with concentration   Family History  Problem Relation Age of Onset   Heart disease Mother    Hypertension Mother    Diabetes Mother    Dementia Mother    COPD Father    Dementia Maternal Grandmother    Colon cancer Neg Hx      Current Outpatient Medications (Cardiovascular):    amLODipine (NORVASC) 10 MG tablet, Take 1 tablet (10 mg total) by mouth daily.   Bempedoic Acid-Ezetimibe (NEXLIZET) 180-10 MG TABS, Take 180 mg by mouth daily.   carvedilol (COREG) 6.25 MG tablet, Take 1 tablet (6.25 mg total) by mouth 2 (two) times daily.   irbesartan (AVAPRO) 300 MG tablet, Take 1 tablet (300 mg total) by mouth daily.   nitroGLYCERIN (NITROSTAT) 0.4 MG SL tablet, Place 1 tablet (0.4 mg total) under the tongue every 5 (five) minutes as needed for chest pain.   omega-3 acid ethyl esters (LOVAZA) 1 g capsule, TAKE TWO CAPSULES EVERY  DAY  Current Outpatient Medications (Respiratory):    albuterol (VENTOLIN HFA) 108 (90 Base) MCG/ACT inhaler, Inhale 2 puffs into the lungs every 6 (six) hours as needed for wheezing or shortness of breath.   budesonide-formoterol (SYMBICORT) 160-4.5 MCG/ACT inhaler, Inhale 2 puffs into the lungs 2 (two) times daily.   levocetirizine (XYZAL) 5 MG tablet, TAKE ONE TABLET EVERY MORNING  Current Outpatient Medications (Analgesics):    acetaminophen (TYLENOL) 325 MG tablet, Take 650 mg by mouth every 6 (six) hours  as needed.   aspirin EC 81 MG tablet, Take 1 tablet (81 mg total) by mouth daily. Swallow whole.   Current Outpatient Medications (Other):    ALPRAZolam (XANAX) 1 MG tablet, TAKE ONE TABLET AT BEDTIME AS NEEDED FORSLEEP -DO NOT DRIVE FOR 8 HOURS AFTER TAKING   famotidine (PEPCID) 20 MG tablet, TAKE ONE TABLET TWICE DAILY   Magnesium 200 MG TABS, Take 1 tablet (200 mg total) by mouth daily.   venlafaxine XR (EFFEXOR-XR) 75 MG 24 hr capsule, TAKE ONE CAPSULE EACH DAY WITH BREAKFAST   Vitamin D, Ergocalciferol, (DRISDOL) 50000 units CAPS capsule, Take 2,000 Units by mouth daily.    Reviewed prior external information including notes and imaging from  primary care provider As well as notes that were available from care everywhere and other healthcare systems.  Past medical history, social, surgical and family history all reviewed in electronic medical record.  No pertanent information unless stated regarding to the chief complaint.   Review of Systems:  No headache, visual changes, nausea, vomiting, diarrhea, constipation, dizziness, abdominal pain, skin rash, fevers, chills, night sweats, weight loss, swollen lymph nodes, body aches, joint swelling, chest pain, shortness of breath, mood changes. POSITIVE muscle aches  Objective  Blood pressure (!) 152/74, pulse 87, height 5' (1.524 m), weight 173 lb (78.5 kg), SpO2 97 %.   General: No apparent distress alert and oriented x3 mood  and affect normal, dressed appropriately.  HEENT: Pupils equal, extraocular movements intact  Respiratory: Patient's speak in full sentences and does not appear short of breath  Cardiovascular: No lower extremity edema, non tender, no erythema  Gait bilateral knee exam does show that there is arthritic changes noted.  Trace effusion noted bilaterally.  Instability noted with valgus and varus force. Arthritic changes noted on multiple different joints. Neck exam does have some limited range of motion in all planes.  Mild crepitus noted. Back exam does have some tenderness to palpation as well.  Seems to be more in the paraspinal musculature right greater than left.  After informed written and verbal consent, patient was seated on exam table. Right knee was prepped with alcohol swab and utilizing anterolateral approach, patient's right knee space was injected with 4:1  marcaine 0.5%: Kenalog 40mg /dL. Patient tolerated the procedure well without immediate complications.  After informed written and verbal consent, patient was seated on exam table. Left knee was prepped with alcohol swab and utilizing anterolateral approach, patient's left knee space was injected with 4:1  marcaine 0.5%: Kenalog 40mg /dL. Patient tolerated the procedure well without immediate complications.   Osteopathic findings C2 flexed rotated and side bent right C4 flexed rotated and side bent left T3 extended rotated and side bent right inhaled third rib T7 extended rotated and side bent left L2 flexed rotated and side bent right Sacrum right on right     Impression and Recommendations:     The above documentation has been reviewed and is accurate and complete [name], DO

## 2021-10-23 ENCOUNTER — Other Ambulatory Visit: Payer: Self-pay

## 2021-10-23 ENCOUNTER — Ambulatory Visit: Payer: Medicare Other | Admitting: Family Medicine

## 2021-10-23 VITALS — BP 152/74 | HR 87 | Ht 60.0 in | Wt 173.0 lb

## 2021-10-23 DIAGNOSIS — M9904 Segmental and somatic dysfunction of sacral region: Secondary | ICD-10-CM | POA: Diagnosis not present

## 2021-10-23 DIAGNOSIS — M5030 Other cervical disc degeneration, unspecified cervical region: Secondary | ICD-10-CM | POA: Diagnosis not present

## 2021-10-23 DIAGNOSIS — M9901 Segmental and somatic dysfunction of cervical region: Secondary | ICD-10-CM | POA: Diagnosis not present

## 2021-10-23 DIAGNOSIS — M9903 Segmental and somatic dysfunction of lumbar region: Secondary | ICD-10-CM | POA: Diagnosis not present

## 2021-10-23 DIAGNOSIS — M170 Bilateral primary osteoarthritis of knee: Secondary | ICD-10-CM | POA: Diagnosis not present

## 2021-10-23 DIAGNOSIS — M9902 Segmental and somatic dysfunction of thoracic region: Secondary | ICD-10-CM

## 2021-10-23 DIAGNOSIS — M9908 Segmental and somatic dysfunction of rib cage: Secondary | ICD-10-CM | POA: Diagnosis not present

## 2021-10-23 NOTE — Assessment & Plan Note (Signed)
Chronic problem with mild exacerbation.  Does respond extremely well to osteopathic manipulation.  Discussed posture and ergonomics.  Discussed proper lifting techniques.  Patient has gained weight and encouraged her to continue to work on losing weight which patient has done in the past.  Patient will follow up with me again 6 to 8 weeks.

## 2021-10-23 NOTE — Assessment & Plan Note (Signed)
Chronic problem with exacerbation.  Has been sometime since patient has had difficulty.  Discussed icing regimen and home exercises.  Discussed avoiding certain activities.  Follow-up again in 6 to 8 weeks

## 2021-10-23 NOTE — Patient Instructions (Addendum)
Injections today Good to see you! Worked the neck 2 cups of water in the morning and a 1 cup before every meals See you again in 6-8 weeks

## 2021-11-25 ENCOUNTER — Telehealth: Payer: Self-pay | Admitting: Pharmacist

## 2021-11-25 NOTE — Chronic Care Management (AMB) (Signed)
Chronic Care Management Pharmacy Assistant   Name: Felicia Perry  MRN: [id number] DOB: 1945-11-06   Reason for Encounter: Diabetes Adherence Call    Recent office visits:  None  Recent consult visits:  10/23/2021 OV (Sports Medicine) [name], DO; no medication changes indicated.  Hospital visits:  None in previous 6 months  Medications: Outpatient Encounter Medications as of 11/25/2021  Medication Sig   acetaminophen (TYLENOL) 325 MG tablet Take 650 mg by mouth every 6 (six) hours as needed.   albuterol (VENTOLIN HFA) 108 (90 Base) MCG/ACT inhaler Inhale 2 puffs into the lungs every 6 (six) hours as needed for wheezing or shortness of breath.   ALPRAZolam (XANAX) 1 MG tablet TAKE ONE TABLET AT BEDTIME AS NEEDED FORSLEEP -DO NOT DRIVE FOR 8 HOURS AFTER TAKING   amLODipine (NORVASC) 10 MG tablet Take 1 tablet (10 mg total) by mouth daily.   aspirin EC 81 MG tablet Take 1 tablet (81 mg total) by mouth daily. Swallow whole.   Bempedoic Acid-Ezetimibe (NEXLIZET) 180-10 MG TABS Take 180 mg by mouth daily.   budesonide-formoterol (SYMBICORT) 160-4.5 MCG/ACT inhaler Inhale 2 puffs into the lungs 2 (two) times daily.   carvedilol (COREG) 6.25 MG tablet Take 1 tablet (6.25 mg total) by mouth 2 (two) times daily.   famotidine (PEPCID) 20 MG tablet TAKE ONE TABLET TWICE DAILY   irbesartan (AVAPRO) 300 MG tablet Take 1 tablet (300 mg total) by mouth daily.   levocetirizine (XYZAL) 5 MG tablet TAKE ONE TABLET EVERY MORNING   Magnesium 200 MG TABS Take 1 tablet (200 mg total) by mouth daily.   nitroGLYCERIN (NITROSTAT) 0.4 MG SL tablet Place 1 tablet (0.4 mg total) under the tongue every 5 (five) minutes as needed for chest pain.   omega-3 acid ethyl esters (LOVAZA) 1 g capsule TAKE TWO CAPSULES EVERY DAY   venlafaxine XR (EFFEXOR-XR) 75 MG 24 hr capsule TAKE ONE CAPSULE EACH DAY WITH BREAKFAST   Vitamin D, Ergocalciferol, (DRISDOL) 50000 units CAPS capsule Take 2,000 Units by  mouth daily.    No facility-administered encounter medications on file as of 11/25/2021.    Recent Relevant Labs: Lab Results  Component Value Date/Time   HGBA1C 7.5 (H) 08/12/2021 11:46 AM   HGBA1C 6.7 (A) 02/05/2021 08:35 AM   HGBA1C 6.7 (H) 08/07/2020 08:45 AM    Kidney Function Lab Results  Component Value Date/Time   CREATININE 0.85 08/12/2021 11:46 AM   CREATININE 0.70 01/02/2021 12:39 PM   CREATININE 0.89 08/07/2020 08:45 AM   CREATININE 0.94 (H) 10/20/2016 09:34 AM   GFR 67.15 08/12/2021 11:46 AM   GFRNONAA >60 01/02/2021 12:21 PM   GFRNONAA 64 08/07/2020 08:45 AM   GFRAA 74 08/07/2020 08:45 AM    Current antihyperglycemic regimen:  None  What recent interventions/DTPs have been made to improve glycemic control:  None  Have there been any recent hospitalizations or ED visits since last visit with [facility]? No  Patient denies hypoglycemic symptoms.  Patient denies hyperglycemic symptoms.  How often are you checking your blood sugar? Patient does not currently monitor home blood sugars. She is trying to bring down her A1c with diet and exercise alone.  Are you checking your feet daily/regularly? Yes, patient states she checks her feet regularly.  Adherence Review: Is the patient currently on a STATIN medication? No Is the patient currently on ACE/ARB medication? Yes Does the patient have >5 day gap between last estimated fill dates? No  Patient states she has  been working on a low carb diet. She states she has been losing 1lb a week. She has eliminated all sugary drinks from her diet. She states bread is her biggest downfall. Although she tries to limit as much as possible. She is eating a lot of fresh vegetable and fruits.   Care Gaps: Medicare Annual Wellness: Completed 09/29/2021 Ophthalmology Exam: Next due on 07/21/2022 Foot Exam: Next due on 08/12/2022 Hemoglobin A1C: 7.5% on 08/12/2021 Colonoscopy: last completed 11/23/2005 Dexa Scan: Ordered on  08/12/2021 Mammogram: last completed 09/18/2021  Future Appointments  Date Time Provider [place]  12/09/2021  9:20 AM Marin Olp, MD [facility]  12/17/2021  9:00 AM [name], DO [facility] None  12/24/2021  8:00 AM [name], MD [facility]  01/06/2022  3:00 PM Warren Danes, PA-C [facility]  01/27/2022  3:45 PM [facility] PHARMACIST [facility]  02/16/2022  9:20 AM Marin Olp, MD [facility]  10/12/2022  8:45 AM [facility] HEALTH COACH [facility]    Star Rating Drugs: Irbesartan 300 mg last filled 10/03/2021 90 DS  April D Calhoun, [place] Pharmacist Assistant [phone]

## 2021-12-01 ENCOUNTER — Other Ambulatory Visit: Payer: Self-pay | Admitting: Family Medicine

## 2021-12-01 DIAGNOSIS — Z1231 Encounter for screening mammogram for malignant neoplasm of breast: Secondary | ICD-10-CM

## 2021-12-02 ENCOUNTER — Ambulatory Visit
Admission: RE | Admit: 2021-12-02 | Discharge: 2021-12-02 | Disposition: A | Discharge status: Home / Self Care | Payer: Medicare Other | Source: Ambulatory Visit | Attending: Family Medicine | Admitting: Family Medicine

## 2021-12-02 ENCOUNTER — Other Ambulatory Visit: Payer: Self-pay

## 2021-12-02 DIAGNOSIS — Z1231 Encounter for screening mammogram for malignant neoplasm of breast: Secondary | ICD-10-CM

## 2021-12-03 ENCOUNTER — Other Ambulatory Visit: Payer: Self-pay | Admitting: Family Medicine

## 2021-12-03 DIAGNOSIS — R928 Other abnormal and inconclusive findings on diagnostic imaging of breast: Secondary | ICD-10-CM

## 2021-12-03 NOTE — Progress Notes (Signed)
Phone [phone] In person visit   Subjective:   Felicia Perry is a 76 y.o. year old very pleasant female patient who presents for/with See problem oriented charting Chief Complaint  Patient presents with   Follow-up   Diabetes   Hyperlipidemia   Hypertension   Asthma   Depression   Gastroesophageal Reflux   This visit occurred during the SARS-CoV-2 public health emergency.  Safety protocols were in place, including screening questions prior to the visit, additional usage of staff PPE, and extensive cleaning of exam room while observing appropriate contact time as indicated for disinfecting solutions.   Past Medical History-  Patient Active Problem List   Diagnosis Date Noted   Diabetes mellitus type 2 with complications ([place]) 21/19/4174    Priority: High   Carotid stenosis 01/23/2020    Priority: High   Chronic diastolic CHF (congestive heart failure) ([place]) 01/12/2019    Priority: High   Coronary artery disease of native artery of native heart with stable angina pectoris ([place])     Priority: High   Syncope 08/04/2016    Priority: High   statin myalgia 06/25/2020    Priority: Medium    Hyperlipidemia, unspecified 07/11/2018    Priority: Medium    H/O hyperparathyroidism 08/18/2016    Priority: Medium    Vitamin D deficiency 08/20/2015    Priority: Medium    Chronic pain syndrome 03/26/2015    Priority: Medium    Insomnia 07/13/2014    Priority: Medium    GERD (gastroesophageal reflux disease) 02/25/2011    Priority: Medium    Depression 04/22/2007    Priority: Medium    Essential hypertension 04/22/2007    Priority: Medium    Asthma 04/22/2007    Priority: Medium    Greater trochanteric bursitis of left hip 08/13/2020    Priority: Low   Senile purpura ([place]) 08/07/2020    Priority: Low   Degenerative arthritis of knee, bilateral 12/08/2018    Priority: Low   Partial hamstring tear, initial encounter 11/16/2018    Priority: Low   Nonallopathic lesion of  sacral region 07/05/2018    Priority: Low   Nonallopathic lesion of cervical region 07/05/2018    Priority: Low   DDD (degenerative disc disease), cervical 05/04/2018    Priority: Low   Degenerative arthritis of left knee 09/15/2017    Priority: Low   PUD (peptic ulcer disease)     Priority: Low   Hyperglycemia 08/06/2015    Priority: Low   Xerosis of skin 08/06/2015    Priority: Low   Hypersomnia with sleep apnea 03/26/2015    Priority: Low   Nonallopathic lesion of lumbosacral region 11/13/2014    Priority: Low   Osteoarthritis of left lower extremity 10/17/2014    Priority: Low   Chronic meniscal tear of knee 10/17/2014    Priority: Low   Nonallopathic lesion of thoracic region 09/14/2014    Priority: Low   Nonallopathic lesion-rib cage 08/21/2014    Priority: Low   Tendinopathy of right rotator cuff 07/30/2014    Priority: Low   Piriformis syndrome of right side 07/30/2014    Priority: Low   Limited joint range of motion 07/13/2014    Priority: Low   Rash 07/13/2014    Priority: Low   SI (sacroiliac) joint dysfunction 10/10/2007    Priority: Low   LOW BACK PAIN 07/01/2007    Priority: Low   Intractable headache 01/02/2021    Medications- reviewed and updated Current Outpatient Medications  Medication  Sig Dispense Refill   acetaminophen (TYLENOL) 325 MG tablet Take 650 mg by mouth every 6 (six) hours as needed.     albuterol (VENTOLIN HFA) 108 (90 Base) MCG/ACT inhaler Inhale 2 puffs into the lungs every 6 (six) hours as needed for wheezing or shortness of breath. 18 g 5   ALPRAZolam (XANAX) 1 MG tablet TAKE ONE TABLET AT BEDTIME AS NEEDED FORSLEEP -DO NOT DRIVE FOR 8 HOURS AFTER TAKING 90 tablet 1   amLODipine (NORVASC) 10 MG tablet Take 1 tablet (10 mg total) by mouth daily. 90 tablet 3   aspirin EC 81 MG tablet Take 1 tablet (81 mg total) by mouth daily. Swallow whole. 90 tablet 3   Bempedoic Acid-Ezetimibe (NEXLIZET) 180-10 MG TABS Take 180 mg by mouth daily.  90 tablet 3   budesonide-formoterol (SYMBICORT) 160-4.5 MCG/ACT inhaler Inhale 2 puffs into the lungs 2 (two) times daily. 1 each 11   carvedilol (COREG) 6.25 MG tablet Take 1 tablet (6.25 mg total) by mouth 2 (two) times daily. 180 tablet 3   famotidine (PEPCID) 20 MG tablet TAKE ONE TABLET TWICE DAILY 60 tablet 5   irbesartan (AVAPRO) 300 MG tablet Take 1 tablet (300 mg total) by mouth daily. 90 tablet 2   levocetirizine (XYZAL) 5 MG tablet TAKE ONE TABLET EVERY MORNING 90 tablet 0   Magnesium 200 MG TABS Take 1 tablet (200 mg total) by mouth daily. 30 each    omega-3 acid ethyl esters (LOVAZA) 1 g capsule TAKE TWO CAPSULES EVERY DAY 180 capsule 3   venlafaxine XR (EFFEXOR-XR) 75 MG 24 hr capsule TAKE ONE CAPSULE EACH DAY WITH BREAKFAST 90 capsule 2   Vitamin D, Ergocalciferol, (DRISDOL) 50000 units CAPS capsule Take 2,000 Units by mouth daily.      nitroGLYCERIN (NITROSTAT) 0.4 MG SL tablet Place 1 tablet (0.4 mg total) under the tongue every 5 (five) minutes as needed for chest pain. 25 tablet 6   No current facility-administered medications for this visit.     Objective:  BP 122/60    Pulse 69    Temp 97.8 F (36.6 C)    Ht 5' (1.524 m)    Wt 168 lb 3.2 oz (76.3 kg)    SpO2 98%    BMI 32.85 kg/m  Gen: NAD, resting comfortably CV: RRR no murmurs rubs or gallops Lungs: CTAB no crackles, wheeze, rhonchi Ext: no edema Skin: warm, dry    Assessment and Plan    # Diabetes-diagnosed 08/07/2020 after having second A1c 6.5 or above S: Medication: None with history of diet controlled.  Had recommended metformin but patient had joined [employer] and wanted to try this and dietary changes before starting medicine-she is down 7 pounds from last visit! Exercise and diet- [employer] 3 days a week- treadmill first then some machines then cardio class Lab Results  Component Value Date   HGBA1C 7.5 (H) 08/12/2021   HGBA1C 6.7 (A) 02/05/2021   HGBA1C 6.7 (H) 08/07/2020   A/P: hopefully  improved- update a1c today. Continue current meds for now  #hypertension #Chronic diastolic CHF S: medication: Amlodipine 10 mg daily, irbesartan 300 mg daily, coreg 6.25 mg BID -metoprolol 50 mg XR in the past -Had required Lasix in the past for fluid overload, cautious with HCTZ due to hercalcemia history BP Readings from Last 3 Encounters:  12/09/21 122/60  10/23/21 (!) 152/74  09/29/21 130/82  A/P: Hypertension well-controlled-continue current medication.  History of chronic diastolic CHF but has not needed diuretics  and no evidence of swelling or weight gain- appears euvolemic  #CAD  #hyperlipidemia with statin myalgia #Carotid stenosis-checked September each year [facility] S: Medication: nexlizet (bempedoic acid-ezetimibe), Lovaza, aspirin 325 mg -History of syncope related to ischemia -Still struggled with elevated triglycerides on this regimen - no shortness of breath. No chest pain with exercise- occasionally after doing chest exercises will note soreness in chest later that day.  -carotid check 06/27/21 with mild disease only Lab Results  Component Value Date   CHOL 143 08/12/2021   HDL 30.00 (L) 08/12/2021   LDLCALC 48 08/07/2020   LDLDIRECT 80.0 08/12/2021   TRIG 237.0 (H) 08/12/2021   CHOLHDL 5 08/12/2021   A/P: CAD asymptomatic- continue current meds. Carotid stenosis stable on last check- continue current meds. Lipids slightly off but statin intolerant- can do full lipid panel today as over a year  # Asthma/allergies S: Maintenance Medication: Symbicort 160-4.52 puffs. For allergies-on Xyzal As needed medication: albuterol. Patient is using this 0x per week on average- very sparing  A/P: doing well on both counts- continue current meds    #Depression-medication also helps with chronic pain/insomnia S: Medication: Effexor 75 mg extended release alprazolam 1 mg as needed before bed. Some restriction in emotion but did worse when came off meds- joints worsened  off meds. No SI. No anhedonia or depressed mood other than some situational stressors A/P: reasonable control/full remission- continue current meds   #GERD S: Medication: Pepcid 20 mg twice daily A/P: reasonable control- continue curren tmeds  #Vitamin D deficiency S: Medication: Has required high-dose treatment in the past. None right now Last vitamin D Lab Results  Component Value Date   VD25OH 45.47 08/12/2021  A/P: Well-controlled within 4 months-recommended just 1000 units/day for maintenance   #Chronic pain/multiple orthopedic issues-followed closely with Dr. Charlann Boxer- doing well now on 6 month schedule- much better   #senile purpura- noted primarily bruising. Stable. Check cbc at least annually. Asa contributes  #Health maintenance-she reports she just sent in Cologuardlast week.  Discussed Shingrix at pharmacy.    Recommended follow up: Return in about 4 months (around 04/08/2022) for follow up- or sooner if needed. Future Appointments  Date Time Provider Ava  12/17/2021  9:00 AM [name], DO [facility] None  12/24/2021  8:00 AM [name], MD [facility]  12/25/2021  1:50 PM [facility] DIAG TOMO 2 [facility]  12/25/2021  2:00 PM [facility] [place] 2 [facility]  01/06/2022  3:00 PM Warren Danes, PA-C [facility]  01/27/2022  3:45 PM [facility] CCM PHARMACIST [facility]  02/16/2022  9:20 AM Marin Olp, MD [facility]  10/12/2022  8:45 AM [facility] HEALTH COACH [facility]   Lab/Order associations:   ICD-10-CM   1. Hyperlipidemia, unspecified hyperlipidemia type  E78.5     2. Essential hypertension  I10     3. Coronary artery disease involving native coronary artery of native heart with unstable angina pectoris ([place])  I25.110     4. Bilateral carotid artery stenosis  I65.23     5. Chronic diastolic CHF (congestive heart failure) ([facility])  I50.32     6. Diabetes mellitus type 2 with complications ([facility])  W38.9      7. Myalgia  M79.10     8. Senile purpura ([facility]) Chronic D69.2       No orders of the defined types were placed in this encounter.  I,Jada Bradford,acting as a scribe for Garret Reddish, MD.,have documented all  relevant documentation on the behalf of Garret Reddish, MD,as directed by  Garret Reddish, MD while in the presence of Garret Reddish, MD.  I, Garret Reddish, MD, have reviewed all documentation for this visit. The documentation on 12/09/21 for the exam, diagnosis, procedures, and orders are all accurate and complete.  Return precautions advised.  Garret Reddish, MD

## 2021-12-09 ENCOUNTER — Ambulatory Visit (INDEPENDENT_AMBULATORY_CARE_PROVIDER_SITE_OTHER): Payer: Medicare Other | Admitting: Family Medicine

## 2021-12-09 ENCOUNTER — Encounter: Payer: Self-pay | Admitting: Family Medicine

## 2021-12-09 ENCOUNTER — Other Ambulatory Visit: Payer: Self-pay

## 2021-12-09 VITALS — BP 122/60 | HR 69 | Temp 97.8°F | Ht 60.0 in | Wt 168.2 lb

## 2021-12-09 DIAGNOSIS — E118 Type 2 diabetes mellitus with unspecified complications: Secondary | ICD-10-CM | POA: Diagnosis not present

## 2021-12-09 DIAGNOSIS — I6523 Occlusion and stenosis of bilateral carotid arteries: Secondary | ICD-10-CM

## 2021-12-09 DIAGNOSIS — K219 Gastro-esophageal reflux disease without esophagitis: Secondary | ICD-10-CM

## 2021-12-09 DIAGNOSIS — I5032 Chronic diastolic (congestive) heart failure: Secondary | ICD-10-CM

## 2021-12-09 DIAGNOSIS — M791 Myalgia, unspecified site: Secondary | ICD-10-CM

## 2021-12-09 DIAGNOSIS — I10 Essential (primary) hypertension: Secondary | ICD-10-CM

## 2021-12-09 DIAGNOSIS — E785 Hyperlipidemia, unspecified: Secondary | ICD-10-CM | POA: Diagnosis not present

## 2021-12-09 DIAGNOSIS — I25110 Atherosclerotic heart disease of native coronary artery with unstable angina pectoris: Secondary | ICD-10-CM

## 2021-12-09 DIAGNOSIS — D692 Other nonthrombocytopenic purpura: Secondary | ICD-10-CM

## 2021-12-09 LAB — HEMOGLOBIN A1C: Hgb A1c MFr Bld: 7 % — ABNORMAL HIGH (ref 4.6–6.5)

## 2021-12-09 NOTE — Patient Instructions (Addendum)
Please stop by lab before you go If you have mychart- we will send your results within 3 business days of [place] receiving them.  If you do not have mychart- we will call you about results within 5 business days of [place] receiving them.  *please also note that you will see labs on mychart as soon as they post. I will later go in and write notes on them- will say "notes from Dr. Yong Channel"   Recommended follow up: Return in about 4 months (around 04/08/2022) for follow up- or sooner if needed.

## 2021-12-10 ENCOUNTER — Ambulatory Visit: Payer: Medicare Other | Admitting: Family Medicine

## 2021-12-10 LAB — COMPREHENSIVE METABOLIC PANEL
ALT: 25 U/L (ref 0–35)
AST: 25 U/L (ref 0–37)
Albumin: 4.5 g/dL (ref 3.5–5.2)
Alkaline Phosphatase: 60 U/L (ref 39–117)
BUN: 29 mg/dL — ABNORMAL HIGH (ref 6–23)
CO2: 29 mEq/L (ref 19–32)
Calcium: 9.9 mg/dL (ref 8.4–10.5)
Chloride: 97 mEq/L (ref 96–112)
Creatinine, Ser: 1.75 mg/dL — ABNORMAL HIGH (ref 0.40–1.20)
GFR: 28.16 mL/min — ABNORMAL LOW (ref >60.00)
Glucose, Bld: 153 mg/dL — ABNORMAL HIGH (ref 70–99)
Potassium: 4.4 mEq/L (ref 3.5–5.1)
Sodium: 137 mEq/L (ref 135–145)
Total Bilirubin: 0.6 mg/dL (ref 0.2–1.2)
Total Protein: 7.7 g/dL (ref 6.0–8.3)

## 2021-12-10 LAB — LIPID PANEL
Cholesterol: 116 mg/dL (ref 0–200)
HDL: 32.9 mg/dL — ABNORMAL LOW (ref >39.00)
NonHDL: 83.27
Total CHOL/HDL Ratio: 4
Triglycerides: 217 mg/dL — ABNORMAL HIGH (ref 0.0–149.0)
VLDL: 43.4 mg/dL — ABNORMAL HIGH (ref 0.0–40.0)

## 2021-12-10 LAB — LDL CHOLESTEROL, DIRECT: Direct LDL: 53 mg/dL

## 2021-12-15 ENCOUNTER — Other Ambulatory Visit: Payer: Self-pay | Admitting: Family Medicine

## 2021-12-16 NOTE — Progress Notes (Signed)
[facility] [place] Phone: [phone] Subjective:   [name], am serving as a scribe for Dr. Hulan Saas.  This visit occurred during the SARS-CoV-2 public health emergency.  Safety protocols were in place, including screening questions prior to the visit, additional usage of staff PPE, and extensive cleaning of exam room while observing appropriate contact time as indicated for disinfecting solutions.   I'm seeing this patient by the request  of:  Marin Olp, MD  CC: Neck and back pain follow-up  [id number]  Felicia Perry is a 76 y.o. female coming in with complaint of back and neck pain. OMT 10/23/2021. Patient states that   Medications patient has been prescribed: None  Taking:         Reviewed prior external information including notes and imaging from previsou exam, outside providers and external EMR if available.   As well as notes that were available from care everywhere and other healthcare systems.  Past medical history, social, surgical and family history all reviewed in electronic medical record.  No pertanent information unless stated regarding to the chief complaint.   Past Medical History:  Diagnosis Date   Adenomatous polyp 11/23/2005   Anxiety    Basal cell carcinoma 2021   right cheek per patient at [facility]   Chronic diastolic CHF (congestive heart failure) ([facility])    Coronary artery disease    a. HC on 08/14/16 showed RCA CTO s/p PCI, 60% LCX disease managed medically with recs to consider staged PCI if continued symptoms.    DIVERTICULOSIS, COLON 04/22/2007        GERD (gastroesophageal reflux disease)    History of shingles    Hypertension    Hypertensive heart disease    Internal hemorrhoids    Osteoarthritis    "knees, hands, lower back" (08/14/2016)   PUD (peptic ulcer disease)    Sleep apnea    a. resolved with weight loss    Allergies  Allergen  Reactions   Celexa [Citalopram Hydrobromide]     psychosis   Cephalosporins     REACTION: tongue swelling   Clarithromycin     REACTION: blisters in mouth   Doxycycline Hyclate     REACTION: nausea, vomiting   Levofloxacin     REACTION: tongue swells   Penicillins     REACTION: per patient causes rash,hives   Rosuvastatin Other (See Comments)    Pt reports causes bilateral lower extremity muscle aches.    Zolpidem Tartrate     REACTION: difficulty with concentration     Review of Systems:  No headache, visual changes, nausea, vomiting, diarrhea, constipation, dizziness, abdominal pain, skin rash, fevers, chills, night sweats, weight loss, swollen lymph nodes, body aches, joint swelling, chest pain, shortness of breath, mood changes. POSITIVE muscle aches  Objective  Blood pressure 128/72, pulse 72, height 5' (1.524 m), weight 166 lb (75.3 kg), SpO2 98 %.   General: No apparent distress alert and oriented x3 mood and affect normal, dressed appropriately.  HEENT: Pupils equal, extraocular movements intact  Respiratory: Patient's speak in full sentences and does not appear short of breath  Cardiovascular: No lower extremity edema, non tender, no erythema  Neck exam does have some mild loss of lordosis.  Patient does have some limited sidebending bilaterally.  The patient does have a 5 out of 5 strength of the upper extremities.  Right side of the lower back does still have tightness and  some tenderness over the greater trochanteric area and gluteus.  Osteopathic findings  C2 flexed rotated and side bent right C7 flexed rotated and side bent left T3 extended rotated and side bent right inhaled rib T7 extended rotated and side bent left L2 flexed rotated and side bent right Sacrum right on right       Assessment and Plan:  SI (sacroiliac) joint dysfunction Chronic problem the patient is stable at the moment.  Does have the underlying arthritis of the knees but seems to be  doing better after the injection.  Discussed icing regimen and home exercises.  Discussed which activities to do and which ones to avoid.  Increase activity slowly.  Follow-up again in 6 to 8 weeks    Nonallopathic problems  Decision today to treat with OMT was based on Physical Exam  After verbal consent patient was treated with HVLA, ME, FPR techniques in cervical, rib, thoracic, lumbar, and sacral  areas  Patient tolerated the procedure well with improvement in symptoms  Patient given exercises, stretches and lifestyle modifications  See medications in patient instructions if given  Patient will follow up in 4-8 weeks     The above documentation has been reviewed and is accurate and complete [name], DO        Note: This dictation was prepared with Dragon dictation along with smaller phrase technology. Any transcriptional errors that result from this process are unintentional.

## 2021-12-17 ENCOUNTER — Other Ambulatory Visit: Payer: Self-pay

## 2021-12-17 ENCOUNTER — Ambulatory Visit: Payer: Medicare Other | Admitting: Family Medicine

## 2021-12-17 VITALS — BP 128/72 | HR 72 | Ht 60.0 in | Wt 166.0 lb

## 2021-12-17 DIAGNOSIS — M533 Sacrococcygeal disorders, not elsewhere classified: Secondary | ICD-10-CM

## 2021-12-17 DIAGNOSIS — M9908 Segmental and somatic dysfunction of rib cage: Secondary | ICD-10-CM

## 2021-12-17 DIAGNOSIS — M9903 Segmental and somatic dysfunction of lumbar region: Secondary | ICD-10-CM

## 2021-12-17 DIAGNOSIS — M9904 Segmental and somatic dysfunction of sacral region: Secondary | ICD-10-CM | POA: Diagnosis not present

## 2021-12-17 DIAGNOSIS — M9901 Segmental and somatic dysfunction of cervical region: Secondary | ICD-10-CM | POA: Diagnosis not present

## 2021-12-17 DIAGNOSIS — M9902 Segmental and somatic dysfunction of thoracic region: Secondary | ICD-10-CM

## 2021-12-17 MED ORDER — VENLAFAXINE HCL ER 75 MG PO CP24: ORAL_CAPSULE | ORAL | 2 refills | Status: DC

## 2021-12-17 NOTE — Assessment & Plan Note (Signed)
Chronic problem the patient is stable at the moment.  Does have the underlying arthritis of the knees but seems to be doing better after the injection.  Discussed icing regimen and home exercises.  Discussed which activities to do and which ones to avoid.  Increase activity slowly.  Follow-up again in 6 to 8 weeks

## 2021-12-17 NOTE — Patient Instructions (Signed)
Effexor refilled See me in 2-3 months

## 2021-12-18 DIAGNOSIS — H40052 Ocular hypertension, left eye: Secondary | ICD-10-CM | POA: Diagnosis not present

## 2021-12-18 DIAGNOSIS — H209 Unspecified iridocyclitis: Secondary | ICD-10-CM | POA: Diagnosis not present

## 2021-12-18 LAB — HM DIABETES EYE EXAM

## 2021-12-21 NOTE — Progress Notes (Deleted)
Cardiology Office Note:    Date:  12/21/2021   ID:  Felicia Perry July 06, 1946, MRN [id number]  PCP:  Felicia Olp, MD   [facility] Providers Cardiologist:  [name], MD {   Referring MD: Felicia Olp, MD    History of Present Illness:    Felicia Perry is a 76 y.o. female with a hx of CAD (dx 2017 - RCA CTO s/p complex PCI, 60% LCx disease, normal LVEF 08/14/16),  chronic diastolic CHF, carotid artery disease, HLD with statin intolerance, HTN, PUD, asthma, anxiety who was previously followed by Dr. Meda Perry who now presents to clinic for follow-up of CAD and CHF.  Per review of the record, the patient was seen by Dr. Meda Perry on 08/12/16 for evaluation of chest pain and a positive stress test which was ordered by PCP. This demonstrated a hypotensive response to exercise and a medium defect of moderate severity present in the basal inferoseptal, mid inferoseptal and apical septal and mid and apical inferior location that was reversible and consistent with ischemia.  LHC on 08/14/16 showed RCA CTO s/p complex PCI, 60% LCX disease managed medically with recs to consider staged PCI if continued symptoms. Myoview  01/2017 was low risk study. TTE 01/2017 showed LVEF of 55-60%, elevated filling pressure, mild to moderate AI.  Last myoview 02/15/20 with EF 71%, mild defect in apex but no ischemia. TTE 02/15/20 with LVEF 60-65%, severe LVH, mild MR, aortic sclerosis. Carotid 06/27/20 with RICA 0-96% and LICA 28-36%.  Last seen in clinic on 07/01/21 by Felicia Perry where she was doing well from CV standpoint with no anginal symptoms.  Today, ***  Past Medical History:  Diagnosis Date   Adenomatous polyp 11/23/2005   Anxiety    Basal cell carcinoma 2021   right cheek per patient at [place] [facility]   Chronic diastolic CHF (congestive heart failure) (HCC)    Coronary artery disease    a. HC on 08/14/16 showed RCA CTO s/p PCI, 60% LCX disease managed medically with recs  to consider staged PCI if continued symptoms.    DIVERTICULOSIS, COLON 04/22/2007        GERD (gastroesophageal reflux disease)    History of shingles    Hypertension    Hypertensive heart disease    Internal hemorrhoids    Osteoarthritis    "knees, hands, lower back" (08/14/2016)   PUD (peptic ulcer disease)    Sleep apnea    a. resolved with weight loss    Past Surgical History:  Procedure Laterality Date   BREAST EXCISIONAL BIOPSY     BREAST SURGERY Left 1980s   Fibrous Tumors removed    CARDIAC CATHETERIZATION N/A 08/14/2016   Procedure: Left Heart Cath and Coronary Angiography;  Surgeon: Felicia Mocha, MD;  Location: [facility];  Service: Cardiovascular;  Laterality: N/A;   CARDIAC CATHETERIZATION N/A 08/14/2016   Procedure: Coronary Stent Intervention;  Surgeon: Felicia Mocha, MD;  Location: [facility];  Service: Cardiovascular;  Laterality: N/A;   CATARACT EXTRACTION W/ INTRAOCULAR LENS  IMPLANT, BILATERAL Bilateral 05/2009   CORONARY ANGIOPLASTY     detached retina left eye     may 2019   TUBAL LIGATION  1970s    Current Medications: No outpatient medications have been marked as taking for the 12/24/21 encounter (Appointment) with [name], MD.     Allergies:   Celexa [citalopram hydrobromide], Cephalosporins, Clarithromycin, Doxycycline hyclate, Levofloxacin, Penicillins, Rosuvastatin, and Zolpidem tartrate   Social  History   Socioeconomic History   Marital status: Divorced    Spouse name: Not on file   Number of children: Not on file   Years of education: Not on file   Highest education level: Not on file  Occupational History   Occupation: Cleaning  Tobacco Use   Smoking status: Former    Packs/day: 1.00    Years: 26.00    Pack years: 26.00    Types: Cigarettes    Quit date: 1989    Years since quitting: 34.1   Smokeless tobacco: Never  Vaping Use   Vaping Use: Never used  Substance and Sexual Activity   Alcohol use: No    Drug use: No   Sexual activity: Not Currently  Other Topics Concern   Not on file  Social History Narrative   Divorced for 53 years in 2016. 2 sons- 1 son had been in prison now living with her in 2019.  2 granddaughters with 1 living close.        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 Best friend Felicia Perry patient of Dr. Yong Perry. Enjoys work, time on Teaching laboratory technician.    Social Determinants of Health   Financial Resource Strain: Low Risk    Difficulty of Paying Living Expenses: Not hard at all  Food Insecurity: No Food Insecurity   Worried About Charity fundraiser in the Last Year: Never true   [place] in the Last Year: Never true  Transportation Needs: No Transportation Needs   Lack of Transportation (Medical): No   Lack of Transportation (Non-Medical): No  Physical Activity: Sufficiently Active   Days of Exercise per Week: 2 days   Minutes of Exercise per Session: 120 min  Stress: Stress Concern Present   Feeling of Stress : To some extent  Social Connections: Moderately Isolated   Frequency of Communication with Friends and Family: More than three times a week   Frequency of Social Gatherings with Friends and Family: More than three times a week   Attends Religious Services: More than 4 times per year   Active Member of [employer] or Organizations: No   Attends Music therapist: Never   Marital Status: Divorced     Family History: The patient's family history includes COPD in her father; Dementia in her maternal grandmother and mother; Diabetes in her mother; Heart disease in her mother; Hypertension in her mother. There is no history of Colon cancer.  ROS:   Please see the history of present illness.    Review of Systems  Constitutional:  Negative for chills, fever and weight loss.  HENT:  Negative for hearing loss.   Eyes:   Negative for blurred vision.  Respiratory:  Positive for shortness of breath.   Cardiovascular:  Negative for chest pain, palpitations, orthopnea, claudication, leg swelling and PND.  Gastrointestinal:  Negative for nausea and vomiting.  Genitourinary:  Negative for hematuria.  Musculoskeletal:  Negative for falls.  Neurological:  Negative for dizziness and loss of consciousness.  Endo/Heme/Allergies:  Positive for environmental allergies.  Psychiatric/Behavioral:  The patient is not nervous/anxious.    EKGs/Labs/Other Studies Reviewed:    The following studies were reviewed today: Myoview 02/15/20: 08/12/16 for evaluation of chest pain and a positive stress test which was ordered by PCP. This demonstrated a hypotensive response to exercise and  a medium defect of moderate severity present in the basal inferoseptal, mid inferoseptal and apical septal and mid and apical inferior location that was reversible and consistent with ischemia.  LHC on 08/14/16 showed RCA CTO s/p complex PCI, 60% LCX disease managed medically with recs to consider staged PCI if continued symptoms. Last stress test 01/2017 was low risk study. Echo 01/2017 showed LVEF of 55-60%, elevated filling pressure, mild to moderate AI.  TTE 02/15/20: IMPRESSIONS   1. Left ventricular ejection fraction, by estimation, is 60 to 65%. The  left ventricle has normal function. The left ventricle has no regional  wall motion abnormalities. The left ventricular internal cavity size was  mildly dilated. There is severe left  ventricular hypertrophy. Left ventricular diastolic parameters were  normal.   2. Right ventricular systolic function is normal. The right ventricular  size is normal. There is mildly elevated pulmonary artery systolic  pressure.   3. The mitral valve is normal in structure. Mild mitral valve  regurgitation. No evidence of mitral stenosis.   4. The aortic valve is tricuspid. Aortic valve regurgitation is mild.   Sclerosis with small gradient but no stenosis.   5. The inferior vena cava is normal in size with greater than 50%  respiratory variability, suggesting right atrial pressure of 3 mmHg.   Carotid 06/27/20: Summary:  Right Carotid: Velocities in the right ICA are consistent with a 1-39%  stenosis.   Left Carotid: Velocities in the left ICA are consistent with a 40-59%  stenosis.                Non-hemodynamically significant plaque <50% noted in the  CCA.   Vertebrals:  Bilateral vertebral arteries demonstrate antegrade flow.  Subclavians: Normal flow hemodynamics were seen in bilateral subclavian               arteries.   EKG:  No new tracing today  Recent Labs: 08/12/2021: Hemoglobin 13.7; Platelets 298.0 12/09/2021: ALT 25; BUN 29; Creatinine, Ser 1.75; Potassium 4.4; Sodium 137  Recent Lipid Panel    Component Value Date/Time   CHOL 116 12/09/2021 0954   CHOL 100 06/22/2019 0907   TRIG 217.0 (H) 12/09/2021 0954   HDL 32.90 (L) 12/09/2021 0954   HDL 32 (L) 06/22/2019 0907   CHOLHDL 4 12/09/2021 0954   VLDL 43.4 (H) 12/09/2021 0954   LDLCALC 48 08/07/2020 0845   LDLDIRECT 53.0 12/09/2021 0954     Risk Assessment/Calculations:       Physical Exam:    VS:  There were no vitals taken for this visit.    Wt Readings from Last 3 Encounters:  12/17/21 166 lb (75.3 kg)  12/09/21 168 lb 3.2 oz (76.3 kg)  10/23/21 173 lb (78.5 kg)     GEN:  Well nourished, well developed in no acute distress HEENT: Normal NECK: No JVD; No carotid bruits CARDIAC: RRR, no murmurs, rubs, gallops RESPIRATORY:  Clear to auscultation without rales, wheezing or rhonchi  ABDOMEN: Soft, non-tender, non-distended MUSCULOSKELETAL:  No edema; No deformity  SKIN: Warm and dry NEUROLOGIC:  Alert and oriented x 3 PSYCHIATRIC:  Normal affect   ASSESSMENT:    No diagnosis found.  PLAN:    In order of problems listed above:  #Coronary Artery Disease: S/p LHC in 2017 with RCA CTO s/p  complex PCI, 60% LCx disease. TTE 01/2020 with LVEF 60-65%. Myoview with apical infarct but no ischemia. No current anginal symptoms. -On ASA 81mg  daily  -Continue irbesartan 300mg  daily -Continue  coreg 6.25mg  BID  -Intolerant to statins; current on nexlizet 180-10mg  daily  #Chronic Diastolic Heart Failure: Euvolemic with NYHA class II symptoms. TTE with LVEF 60-65%, severe LVH. -Not on diuretics -Declined starting farxiga and spironolactone today; will consider in the future -Continue irbesartan 300mg  daily -Continue coreg 6.25mg  BID as above -Low Na diet  #HLD: #Statin Intolerance: LDL controlled at 48 on 08/07/20 -Continue nexlizet 180-10mg  daily  #Carotid Artery Disease: LICA with 25-85%, RICA with 1-39% 06/2020. -Continue nexlizet 180-10mg  daily -On ASA 81mg  daily  -Repeat carotid ultrasound   #HTN: Controlled with goal <120s/80s -Continue irbesartan 300mg  daily -Continue coreg 6.25mg  BID -Continue amlodipine 10mg  daily  #Obesity with BMI 34: -Continue lifestyle modifications as detailed below  Exercise recommendations: Goal of exercising for at least 30 minutes a day, at least 5 times per week.  Please exercise to a moderate exertion.  This means that while exercising it is difficult to speak in full sentences, however you are not so short of breath that you feel you must stop, and not so comfortable that you can carry on a full conversation.  Exertion level should be approximately a 5/10, if 10 is the most exertion you can perform.  Diet recommendations: Recommend a heart healthy diet such as the Mediterranean diet.  This diet consists of plant based foods, healthy fats, lean meats, olive oil.  It suggests limiting the intake of simple carbohydrates such as white breads, pastries, and pastas.  It also limits the amount of red meat, wine, and dairy products such as cheese that one should consume on a daily basis.     Medication Adjustments/Labs and Tests Ordered: Current  medicines are reviewed at length with the patient today.  Concerns regarding medicines are outlined above.  No orders of the defined types were placed in this encounter.  No orders of the defined types were placed in this encounter.   There are no Patient Instructions on file for this visit.    Signed, [name], MD  12/21/2021 7:57 PM    [place]

## 2021-12-22 ENCOUNTER — Other Ambulatory Visit: Payer: Self-pay

## 2021-12-22 ENCOUNTER — Telehealth: Payer: Self-pay | Admitting: Family Medicine

## 2021-12-22 ENCOUNTER — Other Ambulatory Visit (INDEPENDENT_AMBULATORY_CARE_PROVIDER_SITE_OTHER): Payer: Medicare Other

## 2021-12-22 DIAGNOSIS — R7989 Other specified abnormal findings of blood chemistry: Secondary | ICD-10-CM

## 2021-12-22 LAB — BASIC METABOLIC PANEL
BUN: 26 mg/dL — ABNORMAL HIGH (ref 6–23)
CO2: 30 mEq/L (ref 19–32)
Calcium: 9.9 mg/dL (ref 8.4–10.5)
Chloride: 103 mEq/L (ref 96–112)
Creatinine, Ser: 0.92 mg/dL (ref 0.40–1.20)
GFR: 60.91 mL/min (ref >60.00)
Glucose, Bld: 150 mg/dL — ABNORMAL HIGH (ref 70–99)
Potassium: 4.6 mEq/L (ref 3.5–5.1)
Sodium: 143 mEq/L (ref 135–145)

## 2021-12-22 NOTE — Telephone Encounter (Signed)
Called and spoke with pt and made aware the eye drops will not interfere with anything.

## 2021-12-22 NOTE — Telephone Encounter (Signed)
Patient came in stated she was placed on 5 prednisolLONE Acetate 1% eye susp  ML 4x a day- and 5 Brimonidine Tartrate 0.2% eye soln ML  1 drop 2x a day.    Patient will like to know if it will interact with current medications and or lab results.-

## 2021-12-24 ENCOUNTER — Ambulatory Visit: Payer: Medicare Other | Admitting: Cardiology

## 2021-12-24 ENCOUNTER — Other Ambulatory Visit: Payer: Self-pay

## 2021-12-24 ENCOUNTER — Encounter: Payer: Self-pay | Admitting: Cardiology

## 2021-12-24 VITALS — BP 130/90 | HR 71 | Ht 60.0 in | Wt 166.6 lb

## 2021-12-24 DIAGNOSIS — E782 Mixed hyperlipidemia: Secondary | ICD-10-CM

## 2021-12-24 DIAGNOSIS — E669 Obesity, unspecified: Secondary | ICD-10-CM

## 2021-12-24 DIAGNOSIS — Z789 Other specified health status: Secondary | ICD-10-CM

## 2021-12-24 DIAGNOSIS — I10 Essential (primary) hypertension: Secondary | ICD-10-CM

## 2021-12-24 DIAGNOSIS — I5032 Chronic diastolic (congestive) heart failure: Secondary | ICD-10-CM | POA: Diagnosis not present

## 2021-12-24 DIAGNOSIS — I2510 Atherosclerotic heart disease of native coronary artery without angina pectoris: Secondary | ICD-10-CM | POA: Diagnosis not present

## 2021-12-24 DIAGNOSIS — I6523 Occlusion and stenosis of bilateral carotid arteries: Secondary | ICD-10-CM

## 2021-12-24 DIAGNOSIS — I6529 Occlusion and stenosis of unspecified carotid artery: Secondary | ICD-10-CM

## 2021-12-24 NOTE — Patient Instructions (Signed)
Medication Instructions:  ? ?Your physician recommends that you continue on your current medications as directed. Please refer to the Current Medication list given to you today. ? ?*If you need a refill on your cardiac medications before your next appointment, please call your pharmacy* ? ? ?Testing/Procedures: ? ?Your physician has requested that you have a carotid duplex. This test is an ultrasound of the carotid arteries in your neck. It looks at blood flow through these arteries that supply the brain with blood. Allow one hour for this exam. There are no restrictions or special instructions.  PLEASE SCHEDULE FOR THE PT TO HAVE DONE IN SEPT 2023 ? ? ? ?Follow-Up: ?At [facility], you and your health needs are our priority.  As part of our continuing mission to provide you with exceptional heart care, we have created designated Provider Care Teams.  These Care Teams include your primary Cardiologist (physician) and Advanced Practice Providers (APPs -  Physician Assistants and Nurse Practitioners) who all work together to provide you with the care you need, when you need it. ? ?We recommend signing up for the patient portal called "MyChart".  Sign up information is provided on this After Visit Summary.  MyChart is used to connect with patients for Virtual Visits (Telemedicine).  Patients are able to view lab/test results, encounter notes, upcoming appointments, etc.  Non-urgent messages can be sent to your provider as well.   ?To learn more about what you can do with MyChart, go to [email or web address].   ? ?Your next appointment:   ?6 month(s) ? ?The format for your next appointment:   ?In Person ? ?Provider:   ?[name], MD { ? ? ? ?

## 2021-12-24 NOTE — Progress Notes (Signed)
Cardiology Office Note:    Date:  12/24/2021   ID:  Felicia, Perry 1945/11/25, MRN [id number]  PCP:  Marin Olp, MD   [facility] Providers Cardiologist:  [name], MD {   Referring MD: Marin Olp, MD    History of Present Illness:    Felicia Perry is a 76 y.o. female with a hx of CAD (dx 2017 - RCA CTO s/p complex PCI, 60% LCx disease, normal LVEF 08/14/16),  chronic diastolic CHF, carotid artery disease, HLD with statin intolerance, HTN, PUD, asthma, anxiety who was previously followed by Dr. Meda Coffee who now presents to clinic for follow-up of CAD and CHF.  Per review of the record, the patient was seen by Dr. Meda Coffee on 08/12/16 for evaluation of chest pain and a positive stress Perry which was ordered by PCP. This demonstrated a hypotensive response to exercise and a medium defect of moderate severity present in the basal inferoseptal, mid inferoseptal and apical septal and mid and apical inferior location that was reversible and consistent with ischemia.  LHC on 08/14/16 showed RCA CTO s/p complex PCI, 60% LCX disease managed medically with recs to consider staged PCI if continued symptoms. Myoview  01/2017 was low risk study. TTE 01/2017 showed LVEF of 55-60%, elevated filling pressure, mild to moderate AI.  Last myoview 02/15/20 with EF 71%, mild defect in apex but no ischemia. TTE 02/15/20 with LVEF 60-65%, severe LVH, mild MR, aortic sclerosis. Carotid 06/27/20 with RICA 5-36% and LICA 14-43%.  Last seen in clinic on 07/01/21 by [name] where she was doing well from CV standpoint with no anginal symptoms.  Today, she is doing well. From a cardiovascular standpoint, she is doing very well. She remains active. She is exercising, watching her diet, and losing weight. For exercise, she walks and rides her bike with a group of other ladies 3 to 4 times a week. She also enjoys bowling. In a week, she has been consistently losing about 0.5 lbs. Her blood  pressure at home is well controlled, on average, 120/70s.   She is not interested in taking Ozempic or Wegovy at this time. She is compliant in taking her medications.  She has to go to the breast center tomorrow because of a mass found in her L breast.   The patient denies chest pain, chest pressure, dyspnea at rest or with exertion, palpitations, PND, orthopnea, or leg swelling. Denies cough, fever, chills. Denies nausea, vomiting. Denies syncope or presyncope. Denies dizziness or lightheadedness. Denies snoring.  Past Medical History:  Diagnosis Date   Adenomatous polyp 11/23/2005   Anxiety    Basal cell carcinoma 2021   right cheek per patient at [place] [facility]   Chronic diastolic CHF (congestive heart failure) (HCC)    Coronary artery disease    a. HC on 08/14/16 showed RCA CTO s/p PCI, 60% LCX disease managed medically with recs to consider staged PCI if continued symptoms.    DIVERTICULOSIS, COLON 04/22/2007        GERD (gastroesophageal reflux disease)    History of shingles    Hypertension    Hypertensive heart disease    Internal hemorrhoids    Osteoarthritis    "knees, hands, lower back" (08/14/2016)   PUD (peptic ulcer disease)    Sleep apnea    a. resolved with weight loss    Past Surgical History:  Procedure Laterality Date   BREAST EXCISIONAL BIOPSY     BREAST SURGERY Left 1980s  Fibrous Tumors removed    CARDIAC CATHETERIZATION N/A 08/14/2016   Procedure: Left Heart Cath and Coronary Angiography;  Surgeon: Sherren Mocha, MD;  Location: [place] [facility];  Service: Cardiovascular;  Laterality: N/A;   CARDIAC CATHETERIZATION N/A 08/14/2016   Procedure: Coronary Stent Intervention;  Surgeon: Sherren Mocha, MD;  Location: [place] [facility];  Service: Cardiovascular;  Laterality: N/A;   CATARACT EXTRACTION W/ INTRAOCULAR LENS  IMPLANT, BILATERAL Bilateral 05/2009   CORONARY ANGIOPLASTY     detached retina left eye     may 2019   TUBAL LIGATION  1970s     Current Medications: Current Meds  Medication Sig   acetaminophen (TYLENOL) 325 MG tablet Take 650 mg by mouth every 6 (six) hours as needed.   albuterol (VENTOLIN HFA) 108 (90 Base) MCG/ACT inhaler Inhale 2 puffs into the lungs every 6 (six) hours as needed for wheezing or shortness of breath.   ALPRAZolam (XANAX) 1 MG tablet TAKE ONE TABLET AT BEDTIME AS NEEDED FORSLEEP -DO NOT DRIVE FOR 8 HOURS AFTER TAKING   amLODipine (NORVASC) 10 MG tablet Take 1 tablet (10 mg total) by mouth daily.   aspirin EC 81 MG tablet Take 1 tablet (81 mg total) by mouth daily. Swallow whole.   Bempedoic Acid-Ezetimibe (NEXLIZET) 180-10 MG TABS Take 180 mg by mouth daily.   brimonidine (ALPHAGAN) 0.2 % ophthalmic solution Place 1 drop into the left eye 2 (two) times daily.   budesonide-formoterol (SYMBICORT) 160-4.5 MCG/ACT inhaler Inhale 2 puffs into the lungs 2 (two) times daily.   carvedilol (COREG) 6.25 MG tablet Take 1 tablet (6.25 mg total) by mouth 2 (two) times daily.   famotidine (PEPCID) 20 MG tablet TAKE ONE TABLET TWICE DAILY   irbesartan (AVAPRO) 300 MG tablet Take 1 tablet (300 mg total) by mouth daily.   levocetirizine (XYZAL) 5 MG tablet TAKE ONE TABLET EVERY MORNING   Magnesium 200 MG TABS Take 1 tablet (200 mg total) by mouth daily.   omega-3 acid ethyl esters (LOVAZA) 1 g capsule TAKE TWO CAPSULES EVERY DAY   prednisoLONE acetate (PRED FORTE) 1 % ophthalmic suspension Place 1 drop into the left eye 4 (four) times daily.   venlafaxine XR (EFFEXOR-XR) 75 MG 24 hr capsule TAKE ONE CAPSULE EACH DAY WITH BREAKFAST   Vitamin D, Ergocalciferol, (DRISDOL) 50000 units CAPS capsule Take 2,000 Units by mouth daily.      Allergies:   Celexa [citalopram hydrobromide], Cephalosporins, Clarithromycin, Doxycycline hyclate, Levofloxacin, Penicillins, Rosuvastatin, and Zolpidem tartrate   Social History   Socioeconomic History   Marital status: Divorced    Spouse name: Not on file   Number of  children: Not on file   Years of education: Not on file   Highest education level: Not on file  Occupational History   Occupation: Cleaning  Tobacco Use   Smoking status: Former    Packs/day: 1.00    Years: 26.00    Pack years: 26.00    Types: Cigarettes    Quit date: 1989    Years since quitting: 34.1   Smokeless tobacco: Never  Vaping Use   Vaping Use: Never used  Substance and Sexual Activity   Alcohol use: No   Drug use: No   Sexual activity: Not Currently  Other Topics Concern   Not on file  Social History Narrative   Divorced for 55 years in 2016. 2 sons- 1 son had been in prison now living with her in 2019.  2 granddaughters with 1 living  close.        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 Best friend [name] patient of Dr. Yong Channel. Enjoys work, time on Teaching laboratory technician.    Social Determinants of Health   Financial Resource Strain: Low Risk    Difficulty of Paying Living Expenses: Not hard at all  Food Insecurity: No Food Insecurity   Worried About Charity fundraiser in the Last Year: Never true   [place] in the Last Year: Never true  Transportation Needs: No Transportation Needs   Lack of Transportation (Medical): No   Lack of Transportation (Non-Medical): No  Physical Activity: Sufficiently Active   Days of Exercise per Week: 2 days   Minutes of Exercise per Session: 120 min  Stress: Stress Concern Present   Feeling of Stress : To some extent  Social Connections: Moderately Isolated   Frequency of Communication with Friends and Family: More than three times a week   Frequency of Social Gatherings with Friends and Family: More than three times a week   Attends Religious Services: More than 4 times per year   Active Member of [employer] or Organizations: No   Attends Music therapist: Never   Marital Status: Divorced      Family History: The patient's family history includes COPD in her father; Dementia in her maternal grandmother and mother; Diabetes in her mother; Heart disease in her mother; Hypertension in her mother. There is no history of Colon cancer.  ROS:   Please see the history of present illness.    Review of Systems  Constitutional:  Negative for chills, fever and weight loss.  HENT:  Negative for hearing loss.   Eyes:  Negative for blurred vision.  Respiratory:  Negative for shortness of breath.   Cardiovascular:  Negative for chest pain, palpitations, orthopnea, claudication, leg swelling and PND.  Gastrointestinal:  Negative for nausea and vomiting.  Genitourinary:  Negative for hematuria.  Musculoskeletal:  Negative for falls.  Neurological:  Negative for dizziness and loss of consciousness.  Endo/Heme/Allergies:  Negative for environmental allergies.  Psychiatric/Behavioral:  The patient is not nervous/anxious and does not have insomnia.   All other systems reviewed and negative  EKGs/Labs/Other Studies Reviewed:    The following studies were reviewed today: Carotids 06/27/21 Right Carotid: Velocities in the right ICA are consistent with a 1-39%  stenosis.   Left Carotid: Velocities in the left ICA are consistent with a 40-59%  stenosis. Non-hemodynamically significant plaque <50% noted in the  CCA.   Vertebrals:  Bilateral vertebral arteries demonstrate antegrade flow.  Subclavians: Normal flow hemodynamics were seen in bilateral subclavian arteries.  CTA Head/Neck 01/02/21 IMPRESSION: 1. No intracranial arterial occlusion or high-grade stenosis. 2. 60 % stenosis of the proximal left internal carotid artery secondary to predominantly calcified atherosclerosis.   Aortic Atherosclerosis (ICD10-I70.0).  Myoview 02/15/20: 08/12/16 for evaluation of chest pain and a positive stress Perry which was ordered by PCP. This demonstrated a hypotensive response to exercise and a  medium defect of moderate severity present in the basal inferoseptal, mid inferoseptal and apical septal and mid and apical inferior location that was reversible and consistent with ischemia.  LHC on 08/14/16 showed RCA CTO s/p complex PCI, 60% LCX disease managed medically with recs to consider staged PCI if continued symptoms. Last stress Perry 01/2017 was low  risk study. Echo 01/2017 showed LVEF of 55-60%, elevated filling pressure, mild to moderate AI.  TTE 02/15/20: IMPRESSIONS   1. Left ventricular ejection fraction, by estimation, is 60 to 65%. The  left ventricle has normal function. The left ventricle has no regional  wall motion abnormalities. The left ventricular internal cavity size was  mildly dilated. There is severe left ventricular hypertrophy. Left ventricular diastolic parameters were normal.   2. Right ventricular systolic function is normal. The right ventricular  size is normal. There is mildly elevated pulmonary artery systolic  pressure.   3. The mitral valve is normal in structure. Mild mitral valve  regurgitation. No evidence of mitral stenosis.   4. The aortic valve is tricuspid. Aortic valve regurgitation is mild.  Sclerosis with small gradient but no stenosis.   5. The inferior vena cava is normal in size with greater than 50%  respiratory variability, suggesting right atrial pressure of 3 mmHg.   Carotid 06/27/20: Summary:  Right Carotid: Velocities in the right ICA are consistent with a 1-39%  stenosis.   Left Carotid: Velocities in the left ICA are consistent with a 40-59%  stenosis. Non-hemodynamically significant plaque <50% noted in the  CCA.   Vertebrals:  Bilateral vertebral arteries demonstrate antegrade flow.   Subclavians: Normal flow hemodynamics were seen in bilateral subclavian arteries.   EKG:  EKG was not ordered today  Recent Labs: 08/12/2021: Hemoglobin 13.7; Platelets 298.0 12/09/2021: ALT 25 12/22/2021: BUN 26; Creatinine, Ser 0.92;  Potassium 4.6; Sodium 143  Recent Lipid Panel    Component Value Date/Time   CHOL 116 12/09/2021 0954   CHOL 100 06/22/2019 0907   TRIG 217.0 (H) 12/09/2021 0954   HDL 32.90 (L) 12/09/2021 0954   HDL 32 (L) 06/22/2019 0907   CHOLHDL 4 12/09/2021 0954   VLDL 43.4 (H) 12/09/2021 0954   LDLCALC 48 08/07/2020 0845   LDLDIRECT 53.0 12/09/2021 0954     Risk Assessment/Calculations:       Physical Exam:    VS:  BP 130/90    Pulse 71    Ht 5' (1.524 m)    Wt 166 lb 9.6 oz (75.6 kg)    SpO2 98%    BMI 32.54 kg/m     Wt Readings from Last 3 Encounters:  12/24/21 166 lb 9.6 oz (75.6 kg)  12/17/21 166 lb (75.3 kg)  12/09/21 168 lb 3.2 oz (76.3 kg)     GEN:  Well nourished, well developed in no acute distress HEENT: Normal NECK: No JVD; No carotid bruits CARDIAC: RRR, no murmurs, rubs, gallops RESPIRATORY:  Clear to auscultation without rales, wheezing or rhonchi  ABDOMEN: Soft, non-tender, non-distended MUSCULOSKELETAL:  No edema; No deformity  SKIN: Warm and dry NEUROLOGIC:  Alert and oriented x 3 PSYCHIATRIC:  Normal affect   ASSESSMENT:    1. Coronary artery disease involving native coronary artery of native heart without angina pectoris   2. Stenosis of carotid artery, unspecified laterality   3. Bilateral carotid artery stenosis   4. Chronic heart failure with preserved ejection fraction (HCC)   5. Essential hypertension   6. Mixed hyperlipidemia   7. Statin intolerance   8. Obesity (BMI 30-39.9)     PLAN:    In order of problems listed above:  #Coronary Artery Disease: S/p LHC in 2017 with RCA CTO s/p complex PCI, 60% LCx disease. TTE 01/2020 with LVEF 60-65%. Myoview with apical infarct but no ischemia. No current anginal symptoms. Remains active -Continue ASA 81mg  daily  -  Continue irbesartan 300mg  daily -Continue coreg 6.25mg  BID  -Intolerant to statins; current on nexlizet 180-10mg  daily; LDL well controlled in 50s -Continue lifestyle modifications as  detailed below  #Chronic Diastolic Heart Failure: Euvolemic with NYHA class II symptoms. TTE with LVEF 60-65%, severe LVH. -Not on diuretics -Declined starting farxiga and spironolactone -Continue irbesartan 300mg  daily -Continue coreg 6.25mg  BID as above -Low Na diet  #HLD: #Statin Intolerance: LDL at goal in 50s.  -Continue nexlizet 180-10mg  daily  #Carotid Artery Disease: LICA with 33-29%, RICA with 1-39% 06/2020. -Continue nexlizet 180-10mg  daily -On ASA 81mg  daily  -Repeat carotid ultrasound 06/2022  #HTN: Controlled with goal <120s/80s -Continue irbesartan 300mg  daily -Continue coreg 6.25mg  BID -Continue amlodipine 5mg  daily  #Obesity with BMI 34: -Continue lifestyle modifications as detailed below  Exercise recommendations: Goal of exercising for at least 30 minutes a day, at least 5 times per week.  Please exercise to a moderate exertion.  This means that while exercising it is difficult to speak in full sentences, however you are not so short of breath that you feel you must stop, and not so comfortable that you can carry on a full conversation.  Exertion level should be approximately a 5/10, if 10 is the most exertion you can perform.  Diet recommendations: Recommend a heart healthy diet such as the Mediterranean diet.  This diet consists of plant based foods, healthy fats, lean meats, olive oil.  It suggests limiting the intake of simple carbohydrates such as white breads, pastries, and pastas.  It also limits the amount of red meat, wine, and dairy products such as cheese that one should consume on a daily basis.     Medication Adjustments/Labs and Tests Ordered: Current medicines are reviewed at length with the patient today.  Concerns regarding medicines are outlined above.  Orders Placed This Encounter  Procedures   VAS US CAROTID   No orders of the defined types were placed in this encounter.   There are no Patient Instructions on file for this  visit.  I,Mykaella Javier,acting as a scribe for [name], MD.,have documented all relevant documentation on the behalf of [name], MD,as directed by  [name], MD while in the presence of [name], MD.  I, [name], MD, have reviewed all documentation for this visit. The documentation on 12/24/21 for the exam, diagnosis, procedures, and orders are all accurate and complete.   Signed, [name], MD  12/24/2021 8:30 AM    [place]

## 2021-12-25 ENCOUNTER — Ambulatory Visit
Admission: RE | Admit: 2021-12-25 | Discharge: 2021-12-25 | Disposition: A | Discharge status: Home / Self Care | Payer: Medicare Other | Source: Ambulatory Visit | Attending: Family Medicine | Admitting: Family Medicine

## 2021-12-25 ENCOUNTER — Other Ambulatory Visit: Payer: Self-pay | Admitting: Family Medicine

## 2021-12-25 DIAGNOSIS — R928 Other abnormal and inconclusive findings on diagnostic imaging of breast: Secondary | ICD-10-CM

## 2021-12-25 DIAGNOSIS — H209 Unspecified iridocyclitis: Secondary | ICD-10-CM | POA: Diagnosis not present

## 2021-12-25 DIAGNOSIS — H40052 Ocular hypertension, left eye: Secondary | ICD-10-CM | POA: Diagnosis not present

## 2021-12-25 DIAGNOSIS — R922 Inconclusive mammogram: Secondary | ICD-10-CM | POA: Diagnosis not present

## 2021-12-25 LAB — HM DIABETES EYE EXAM

## 2021-12-26 ENCOUNTER — Encounter: Payer: Self-pay | Admitting: Family Medicine

## 2021-12-30 ENCOUNTER — Ambulatory Visit
Admission: RE | Admit: 2021-12-30 | Discharge: 2021-12-30 | Disposition: A | Discharge status: Home / Self Care | Payer: Medicare Other | Source: Ambulatory Visit | Attending: Family Medicine | Admitting: Family Medicine

## 2021-12-30 DIAGNOSIS — N6311 Unspecified lump in the right breast, upper outer quadrant: Secondary | ICD-10-CM | POA: Diagnosis not present

## 2021-12-30 DIAGNOSIS — R928 Other abnormal and inconclusive findings on diagnostic imaging of breast: Secondary | ICD-10-CM

## 2021-12-30 DIAGNOSIS — D0511 Intraductal carcinoma in situ of right breast: Secondary | ICD-10-CM | POA: Diagnosis not present

## 2021-12-30 HISTORY — PX: BREAST BIOPSY: SHX20

## 2021-12-31 NOTE — Progress Notes (Deleted)
? ?  I, Wendy Poet, LAT, ATC, am serving as scribe for Dr. Lynne Leader. ? ?Felicia Perry is a 76 y.o. female who presents to [facility] today for B knee pain.  She was last seen by Dr. Tamala Julian for OMT on 12/17/21.  Today, she reports B knee pain after suffering a fall on .  She locates her pain to . ? ?Knee swelling: ?Knee mechanical symptoms: ?Aggravating factors: ?Treatments tried:  ? ? ?Pertinent review of systems: *** ? ?Relevant historical information: *** ? ? ?Exam:  ?There were no vitals taken for this visit. ?General: Well Developed, well nourished, and in no acute distress.  ? ?MSK: *** ? ? ? ?Lab and Radiology Results ?No results found for this or any previous visit (from the past 72 hour(s)). ?MM CLIP PLACEMENT RIGHT ? ?Result Date: 12/30/2021 ?CLINICAL DATA:  I evaluate post biopsy marker clip placement following ultrasound-guided core needle biopsy of a right breast mass. EXAM: 3D DIAGNOSTIC RIGHT MAMMOGRAM POST ULTRASOUND BIOPSY COMPARISON:  Previous exam(s). FINDINGS: 3D Mammographic images were obtained following ultrasound guided biopsy of a right breast mass. The biopsy marking clip is in expected position at the site of biopsy. IMPRESSION: Appropriate positioning of the ribbon shaped biopsy marking clip at the site of biopsy in the in the lateral right breast within the bilobed mass. Final Assessment: Post Procedure Mammograms for Marker Placement Electronically Signed   By: Lajean Manes M.D.   On: 12/30/2021 08:27 ? ?[place] RT BREAST BX W LOC DEV 1ST LESION IMG BX SPEC US GUIDE ? ?Result Date: 12/30/2021 ?CLINICAL DATA:  Patient presents for ultrasound-guided core needle biopsy of a small right breast mass. EXAM: ULTRASOUND GUIDED RIGHT BREAST CORE NEEDLE BIOPSY COMPARISON:  Previous exam(s). PROCEDURE: I met with the patient and we discussed the procedure of ultrasound-guided biopsy, including benefits and alternatives. We discussed the high likelihood of a successful  procedure. We discussed the risks of the procedure, including infection, bleeding, tissue injury, clip migration, and inadequate sampling. Informed written consent was given. The usual time-out protocol was performed immediately prior to the procedure. Lesion quadrant: Upper outer quadrant Using sterile technique and 1% Lidocaine as local anesthetic, under direct ultrasound visualization, a 14 gauge spring-loaded device was used to perform biopsy of the small, 7 mm, bilobed mass in the 9:30 o'clock position of the right breast using a inferolateral approach. At the conclusion of the procedure a ribbon shaped tissue marker clip was deployed into the biopsy cavity. Follow up 2 view mammogram was performed and dictated separately. IMPRESSION: Ultrasound guided biopsy of a right breast mass. No apparent complications. Electronically Signed   By: Lajean Manes M.D.   On: 12/30/2021 08:14  ? ? ? ? ?Assessment and Plan: ?76 y.o. female with *** ? ? ?PDMP not reviewed this encounter. ?No orders of the defined types were placed in this encounter. ? ?No orders of the defined types were placed in this encounter. ? ? ? ?Discussed warning signs or symptoms. Please see discharge instructions. Patient expresses understanding. ? ? ?*** ? ?

## 2022-01-01 ENCOUNTER — Ambulatory Visit: Payer: Medicare Other | Admitting: Family Medicine

## 2022-01-01 ENCOUNTER — Telehealth: Payer: Self-pay | Admitting: Hematology and Oncology

## 2022-01-01 NOTE — Progress Notes (Signed)
? ?I, Wendy Poet, LAT, ATC, am serving as scribe for Dr. Lynne Leader. ? ?Felicia Perry is a 76 y.o. female who presents to [place] at [facility] today for bilat knee pain.  She was last seen by Dr. Tamala Julian on 12/17/21 for OMT. Today, pt reports bilat knee pain ongoing since 3/5 after suffering a fall. Pt notes she is not prone to falls. Pt was at a restaurant and missed a step and landing on bilat knees and R shoulder. Pt reports R shoulder pain has resolved.  She locates her pain to anterior aspect of the R knee and L knee along the anterior aspect distal to L knee and anterior lower leg. Of note, pt is dealing w/ a recent dx of non-invasive breast cancer and expressed extreme anxiousness and stress eating. ? ?Knee swelling: no- bruising ?Knee mechanical symptoms: no ?Aggravating factors: walking ?Treatments tried: epsom salt soaks, Tylenol ? ? ?Pertinent review of systems: No fevers or chills ? ?Relevant historical information: Recently diagnosed with breast cancer. ?Controlled diabetes with an A1c of 7. ? ? ?Exam:  ?BP (!) 164/96   Pulse 78   Ht 5' (1.524 m)   Wt 170 lb (77.1 kg)   SpO2 98%   BMI 33.20 kg/m?  ?General: Well Developed, well nourished, and in no acute distress.  ? ?MSK:  ?Right knee: Mild bruising anterior knee otherwise normal appearing. ?Normal motion with crepitation. ?Tender palpation medial joint line. ?Intact strength. ?Stable ligamentous exam. ? ?Left knee: Mild bruising anterior knee otherwise normal-appearing ?Normal motion with crepitation. ?Tender palpation medial joint line. ?Intact strength. ?Stable ligamentous exam. ? ? ? ?Lab and Radiology Results ? ?Procedure: Real-time Ultrasound Guided Injection of right knee superior lateral patellar space ?Device: Philips Affiniti 50G ?Images permanently stored and available for review in PACS ?Verbal informed consent obtained.  Discussed risks and benefits of procedure. Warned about infection bleeding damage to  structures skin hypopigmentation and fat atrophy among others. ?Patient expresses understanding and agreement ?Time-out conducted.   ?Noted no overlying erythema, induration, or other signs of local infection.   ?Skin prepped in a sterile fashion.   ?Local anesthesia: Topical Ethyl chloride.   ?With sterile technique and under real time ultrasound guidance: 40 mg of Kenalog and 2 mL of Marcaine injected into knee joint. Fluid seen entering the joint capsule.   ?Completed without difficulty   ?Pain immediately resolved suggesting accurate placement of the medication.   ?Advised to call if fevers/chills, erythema, induration, drainage, or persistent bleeding.   ?Images permanently stored and available for review in the ultrasound unit.  ?Impression: Technically successful ultrasound guided injection. ? ? ? ?Procedure: Real-time Ultrasound Guided Injection of left knee superior lateral patellar space ?Device: Philips Affiniti 50G ?Images permanently stored and available for review in PACS ?Verbal informed consent obtained.  Discussed risks and benefits of procedure. Warned about infection bleeding damage to structures skin hypopigmentation and fat atrophy among others. ?Patient expresses understanding and agreement ?Time-out conducted.   ?Noted no overlying erythema, induration, or other signs of local infection.   ?Skin prepped in a sterile fashion.   ?Local anesthesia: Topical Ethyl chloride.   ?With sterile technique and under real time ultrasound guidance: 40 mg of Kenalog and 2 mL of Marcaine injected into knee joint. Fluid seen entering the joint capsule.   ?Completed without difficulty   ?Pain immediately resolved suggesting accurate placement of the medication.   ?Advised to call if fevers/chills, erythema, induration, drainage, or persistent bleeding.   ?  Images permanently stored and available for review in the ultrasound unit.  ?Impression: Technically successful ultrasound guided injection. ? ? ? ?X-ray  images bilateral knees obtained today personally and independently interpreted ? ?Right knee: Moderate to severe medial compartment DJD.  Moderate patellofemoral DJD.  No acute fractures are visible. ? ?Left knee: Moderate to compartment DJD.  Moderate patellofemoral DJD.  No acute fractures are visible. ? ?Await formal radiology review ? ? ? ? ?Assessment and Plan: ?76 y.o. female with bilateral knee pain.  This is an acute exacerbation of her chronic bilateral knee pain.  She had a steroid injection bilateral knees just about 3 months ago.  She is here but sooner than she would normally be for this because she fell landed on the anterior knee.  X-ray today does not show fracture and her physical exam does not support a fracture diagnosis either.  Effectively I think she stirred up for knee arthritis with this fall.  Plan for steroid injection bilaterally. ? ?With her overall health right now she has a lot going on.  She just was diagnosed with breast cancer (ductal carcinoma in situ) and will be seeing her cancer treatment team on March 15 next week.  My goal today is to get her knee is feeling better so that she can focus on her breast cancer and her other health issues.  She has an appointment scheduled with Dr. Tamala Julian partner here in clinic in April.  Recommend following up with him then.  Check back with me as needed. ?Tylenol and Voltaren gel for pain control. ? ?Total encounter time 30 minutes including face-to-face time with the patient and, reviewing past medical record, and charting on the date of service.   ?Spent a fair amount of time today talking about her other health issues as well as her knees.  Time-based billing excludes time to perform the injection. ?PDMP not reviewed this encounter. ?Orders Placed This Encounter  ?Procedures  ? [place] LIMITED JOINT SPACE STRUCTURES LOW BILAT(NO LINKED CHARGES)  ?  Order Specific Question:   Reason for Exam (SYMPTOM  OR DIAGNOSIS REQUIRED)  ?  Answer:   bilateral  knee pain  ?  Order Specific Question:   Preferred imaging location?  ?  Answer:   [place]  ? DG Knee AP/LAT W/Sunrise Left  ?  Standing Status:   Future  ?  Number of Occurrences:   1  ?  Standing Expiration Date:   02/02/2022  ?  Order Specific Question:   Reason for Exam (SYMPTOM  OR DIAGNOSIS REQUIRED)  ?  Answer:   bilateral knee pain  ?  Order Specific Question:   Preferred imaging location?  ?  Answer:   [facility]  ? DG Knee AP/LAT W/Sunrise Right  ?  Standing Status:   Future  ?  Number of Occurrences:   1  ?  Standing Expiration Date:   02/02/2022  ?  Order Specific Question:   Reason for Exam (SYMPTOM  OR DIAGNOSIS REQUIRED)  ?  Answer:   bilateral knee pain  ?  Order Specific Question:   Preferred imaging location?  ?  Answer:   [facility]  ? ?No orders of the defined types were placed in this encounter. ? ? ? ?Discussed warning signs or symptoms. Please see discharge instructions. Patient expresses understanding. ? ? ?The above documentation has been reviewed and is accurate and complete Lynne Leader, M.D. ? ? ?

## 2022-01-01 NOTE — Telephone Encounter (Signed)
Spoke to patient to confirm morning clinic appointment for 3/15, packet will be mailed to patient ?

## 2022-01-02 ENCOUNTER — Other Ambulatory Visit: Payer: Self-pay

## 2022-01-02 ENCOUNTER — Ambulatory Visit: Payer: Medicare Other | Admitting: Family Medicine

## 2022-01-02 ENCOUNTER — Ambulatory Visit (INDEPENDENT_AMBULATORY_CARE_PROVIDER_SITE_OTHER): Payer: Medicare Other

## 2022-01-02 ENCOUNTER — Ambulatory Visit: Payer: Self-pay

## 2022-01-02 VITALS — BP 164/96 | HR 78 | Ht 60.0 in | Wt 170.0 lb

## 2022-01-02 DIAGNOSIS — M25562 Pain in left knee: Secondary | ICD-10-CM

## 2022-01-02 DIAGNOSIS — M25561 Pain in right knee: Secondary | ICD-10-CM | POA: Diagnosis not present

## 2022-01-02 DIAGNOSIS — M170 Bilateral primary osteoarthritis of knee: Secondary | ICD-10-CM | POA: Diagnosis not present

## 2022-01-02 DIAGNOSIS — D0591 Unspecified type of carcinoma in situ of right breast: Secondary | ICD-10-CM

## 2022-01-02 NOTE — Patient Instructions (Addendum)
Thank you for coming in today.  ? ?Please get an Xray today before you leave  ? ?You received steroid injections in both of your knees today. Seek immediate medical attention if the joint becomes red, extremely painful, or is oozing fluid.  ? ?Please use Voltaren gel (Generic Diclofenac Gel) up to 4x daily for pain as needed.  This is available over-the-counter as both the name brand Voltaren gel and the generic diclofenac gel.  ? ?Recheck back with me as needed ?

## 2022-01-05 ENCOUNTER — Encounter: Payer: Self-pay | Admitting: *Deleted

## 2022-01-05 ENCOUNTER — Telehealth: Payer: Self-pay

## 2022-01-05 ENCOUNTER — Other Ambulatory Visit: Payer: Self-pay

## 2022-01-05 ENCOUNTER — Ambulatory Visit: Payer: Medicare Other | Admitting: Family Medicine

## 2022-01-05 VITALS — BP 162/100 | HR 75 | Ht 60.0 in

## 2022-01-05 DIAGNOSIS — G8929 Other chronic pain: Secondary | ICD-10-CM | POA: Diagnosis not present

## 2022-01-05 DIAGNOSIS — D0511 Intraductal carcinoma in situ of right breast: Secondary | ICD-10-CM

## 2022-01-05 DIAGNOSIS — D0591 Unspecified type of carcinoma in situ of right breast: Secondary | ICD-10-CM

## 2022-01-05 DIAGNOSIS — M25562 Pain in left knee: Secondary | ICD-10-CM | POA: Diagnosis not present

## 2022-01-05 DIAGNOSIS — R262 Difficulty in walking, not elsewhere classified: Secondary | ICD-10-CM

## 2022-01-05 MED ORDER — OXYCODONE-ACETAMINOPHEN 5-325 MG PO TABS
1.0000 | ORAL_TABLET | Freq: Three times a day (TID) | ORAL | 0 refills | Status: DC | PRN
Start: 2022-01-05 — End: 2022-01-14

## 2022-01-05 NOTE — Progress Notes (Signed)
? ?I, Wendy Poet, LAT, ATC, am serving as scribe for Dr. Lynne Leader. ? ?Felicia Perry is a 76 y.o. female who presents to [place] at [facility] today for f/u of L knee pain after being seen 3 days ago for B knee pain after suffering a fall on 12/28/21.  She was last seen by Dr. Georgina Snell on 01/02/22 and had B knee steroid injections.  She was also advised to use Voltaren gel.  Today, pt reports she went to get up from her chair to go to the bathroom on Saturday, and her L knee gave out on her and she fell.  Pt locates pain to the anterior-medial aspect of her L knee. ? ?She notes her knee gave way but she did not actually make contact with the floor when she fell.  She caught herself with her hands.  Her knee does hurt quite a bit however and she is having some trouble walking normally. ? ?She does have a walker at home but has not used it yet. ? ?L knee swelling: yes ?Treatments tried: ice, Voltaren gel, heat ? ?Diagnostic testing: R and L knee XR- 01/02/22 ? ?Pertinent review of systems: No fevers or chills ? ?Relevant historical information: Recently diagnosed with breast cancer.  First appointment with her cancer team is tomorrow. ? ? ?Exam:  ?BP (!) 162/100   Pulse 75   Ht 5' (1.524 m)   SpO2 98%   BMI 33.20 kg/m?  ?General: Well Developed, well nourished, and in no acute distress.  ? ?MSK: Left knee: Swollen and tender at left medial joint line.  Normal knee motion. ? ? ? ?Lab and Radiology Results ?No results found for this or any previous visit (from the past 72 hour(s)). ?DG Knee AP/LAT W/Sunrise Left ? ?Result Date: 01/02/2022 ?CLINICAL DATA:  Bilateral knee pain for 5 days after a fall. EXAM: LEFT KNEE 3 VIEWS; RIGHT KNEE 3 VIEWS COMPARISON:  None. FINDINGS: Right knee: Severe medial compartment joint space narrowing and peripheral osteophytosis. Mild peripheral lateral compartment degenerative osteophytosis. Large superior and moderate medial and lateral patellar degenerative  osteophytes. Mild chronic spurring at the quadriceps insertion on the patella. Small joint effusion. No acute fracture or dislocation. Left knee: Severe medial compartment joint space narrowing. Mild peripheral medial compartment degenerative osteophytosis. Tiny joint effusion. Mild patellofemoral joint space narrowing and peripheral osteophytosis. No acute fracture or dislocation. IMPRESSION: Severe bilateral knee medial compartment osteoarthritis. Moderate right patellofemoral osteoarthritis. Electronically Signed   By: Yvonne Kendall M.D.   On: 01/02/2022 18:00  ? ?DG Knee AP/LAT W/Sunrise Right ? ?Result Date: 01/02/2022 ?CLINICAL DATA:  Bilateral knee pain for 5 days after a fall. EXAM: LEFT KNEE 3 VIEWS; RIGHT KNEE 3 VIEWS COMPARISON:  None. FINDINGS: Right knee: Severe medial compartment joint space narrowing and peripheral osteophytosis. Mild peripheral lateral compartment degenerative osteophytosis. Large superior and moderate medial and lateral patellar degenerative osteophytes. Mild chronic spurring at the quadriceps insertion on the patella. Small joint effusion. No acute fracture or dislocation. Left knee: Severe medial compartment joint space narrowing. Mild peripheral medial compartment degenerative osteophytosis. Tiny joint effusion. Mild patellofemoral joint space narrowing and peripheral osteophytosis. No acute fracture or dislocation. IMPRESSION: Severe bilateral knee medial compartment osteoarthritis. Moderate right patellofemoral osteoarthritis. Electronically Signed   By: Yvonne Kendall M.D.   On: 01/02/2022 18:00   ? ?I, Lynne Leader, personally (independently) visualized and performed the interpretation of the images attached in this note. ? ? ? ?Assessment and Plan: ?76  y.o. female with bilateral knee pain left worse than right.  She is having quite a bit of discomfort and pain and dysfunction with her left knee.  She is having episodes where her knees are giving way because they are hurting so  much.  On recent x-rays she does have degenerative changes that I think is the main source of her pain.  However she and her x-ray does not explain the severity of her pain today.  Plan to obtain an MRI to further evaluate the source of her pain.  This is also helpful in the setting of her recent cancer diagnosis. ? ?For pain control immediately will use opiates.  After discussion best bet is probably oxycodone for now. ? ?For also pain control fall prevention recommend use of walker.  Recommend avoiding use of cane as this will not be very effective for fall prevention. ? ?Additionally we will work on authorization now for hyaluronic acid injections for both knees.  This will take some time to get approved but I think will be helpful in that medium term. ? ?She has her cancer care that we will be starting soon and I think will take up a lot of her time.  Hopefully we can get her knees better controlled so that this will be less of an issue for her. ? ? ? ?PDMP reviewed during this encounter. ?Orders Placed This Encounter  ?Procedures  ? MR Knee Left  Wo Contrast  ?  Standing Status:   Future  ?  Standing Expiration Date:   01/06/2023  ?  Order Specific Question:   What is the patient's sedation requirement?  ?  Answer:   No Sedation  ?  Order Specific Question:   Does the patient have a pacemaker or implanted devices?  ?  Answer:   No  ?  Order Specific Question:   Preferred imaging location?  ?  Answer:   [facility] (table limit-350lbs)  ? ?Meds ordered this encounter  ?Medications  ? oxyCODONE-acetaminophen (PERCOCET/ROXICET) 5-325 MG tablet  ?  Sig: Take 1 tablet by mouth every 8 (eight) hours as needed for severe pain.  ?  Dispense:  15 tablet  ?  Refill:  0  ? ? ? ?Discussed warning signs or symptoms. Please see discharge instructions. Patient expresses understanding. ? ? ?The above documentation has been reviewed and is accurate and complete Lynne Leader, M.D. ? ? ?

## 2022-01-05 NOTE — Telephone Encounter (Signed)
Left pt a VM that she's been approved for Orthovisc bilat and she could call the office to get scheduled at her convenience. ?

## 2022-01-05 NOTE — Progress Notes (Signed)
Left knee x-ray shows severe arthritis.  No fractures are visible

## 2022-01-05 NOTE — Telephone Encounter (Signed)
Patient called back to schedule.  ?Please confirm with Dr Georgina Snell if he would like her to have the MRI before or after the gel injections? She did mention that she would be available this Friday. ? ?Please advise. ? ?

## 2022-01-05 NOTE — Progress Notes (Signed)
Right knee x-ray shows severe arthritis.  No fractures are visible.

## 2022-01-05 NOTE — Telephone Encounter (Signed)
Called pt and relayed message concerning timing of MRI and knee gel injections.  She is to get her MRI first before proceeding w/ gel injections.  Pt verbalizes understanding. ?

## 2022-01-05 NOTE — Telephone Encounter (Signed)
Pt is to get her MRI first before proceeding w/ gel injections. ?

## 2022-01-05 NOTE — Patient Instructions (Addendum)
Thank you for coming in today.  ? ?We are working to get the gel shots approved. You will hear from [facility] once they are approved. ? ?You should hear from MRI scheduling within 1 week. If you do not hear please let me know.   ? ?Please use your walker ?

## 2022-01-06 ENCOUNTER — Other Ambulatory Visit: Payer: Self-pay

## 2022-01-06 ENCOUNTER — Ambulatory Visit: Payer: Medicare Other | Admitting: Physician Assistant

## 2022-01-06 ENCOUNTER — Encounter: Payer: Self-pay | Admitting: Physician Assistant

## 2022-01-06 DIAGNOSIS — Z1283 Encounter for screening for malignant neoplasm of skin: Secondary | ICD-10-CM

## 2022-01-06 DIAGNOSIS — L821 Other seborrheic keratosis: Secondary | ICD-10-CM | POA: Diagnosis not present

## 2022-01-06 DIAGNOSIS — Z85828 Personal history of other malignant neoplasm of skin: Secondary | ICD-10-CM

## 2022-01-06 DIAGNOSIS — D0461 Carcinoma in situ of skin of right upper limb, including shoulder: Secondary | ICD-10-CM | POA: Diagnosis not present

## 2022-01-06 DIAGNOSIS — D485 Neoplasm of uncertain behavior of skin: Secondary | ICD-10-CM

## 2022-01-06 DIAGNOSIS — D692 Other nonthrombocytopenic purpura: Secondary | ICD-10-CM | POA: Diagnosis not present

## 2022-01-06 NOTE — Progress Notes (Signed)
? ?  Follow-Up Visit ?  ?Subjective  ?Felicia Perry is a 76 y.o. female who presents for the following: Annual Exam (Few places on arms that just don't look right- no family f/o melanoma or non mole skin cancer. Personal history of non mole skin cancer but no melanoma. ). ? ? ?The following portions of the chart were reviewed this encounter and updated as appropriate:  Tobacco  Allergies  Meds  Problems  Med Hx  Surg Hx  Fam Hx   ?  ? ?Objective  ?Well appearing patient in no apparent distress; mood and affect are within normal limits. ? ?A full examination was performed including scalp, head, eyes, ears, nose, lips, neck, chest, axillae, abdomen, back, buttocks, bilateral upper extremities, bilateral lower extremities, hands, feet, fingers, toes, fingernails, and toenails. All findings within normal limits unless otherwise noted below. ? ?No atypical nevi or signs of NMSC noted at the time of the visit.  ? ?Right Forearm - Posterior ?Black crust surrounding brown pigment.  ? ? ? ? ? ? ? ?Assessment & Plan  ?Encounter for screening for malignant neoplasm of skin ? ?Yearly skin examination  ? ?Neoplasm of uncertain behavior of skin ?Right Forearm - Posterior ? ?Skin / nail biopsy ?Type of biopsy: tangential   ?Informed consent: discussed and consent obtained   ?Timeout: patient name, date of birth, surgical site, and procedure verified   ?Procedure prep:  Patient was prepped and draped in usual sterile fashion (Non sterile) ?Prep type:  Chlorhexidine ?Anesthesia: the lesion was anesthetized in a standard fashion   ?Anesthetic:  1% lidocaine w/ epinephrine 1-100,000 local infiltration ?Instrument used: flexible razor blade   ?Outcome: patient tolerated procedure well   ?Post-procedure details: wound care instructions given   ? ?Specimen 1 - Surgical pathology ?Differential Diagnosis: r/o atypia, sk  ? ?Check Margins: No ? ? ? ?I, Gaurav Baldree, PA-C, have reviewed all documentation's for this visit.  The  documentation on 01/06/22 for the exam, diagnosis, procedures and orders are all accurate and complete. ?

## 2022-01-06 NOTE — Progress Notes (Signed)
?Radiation Oncology         [phone] ?________________________________ ? ?Multidisciplinary Breast Oncology Clinic [facility]) ?Initial Outpatient Consultation ? ?Name: Felicia Perry MRN: [id number]  ?Date: 01/07/2022  DOB: [birth year] ? ?[id number], Brayton Mars, MD  Erroll Luna, MD  ? ?REFERRING PHYSICIAN: Erroll Luna, MD ? ?DIAGNOSIS: The encounter diagnosis was Ductal carcinoma in situ (DCIS) of right breast. ? ?Stage 0 (cTis (DCIS), cN0, cM0) Right Breast, Intermediate grade ductal carcinoma in-situ, ER+ / PR+ / Her2 not assessed ? ?  ICD-10-CM   ?1. Ductal carcinoma in situ (DCIS) of right breast  D05.11   ?  ? ? ?HISTORY OF PRESENT ILLNESS::Felicia Perry is a 76 y.o. female who is presenting to the office today for evaluation of her newly diagnosed breast cancer. She is accompanied by her daughter in-law. She is doing well overall.  ? ?She had routine screening mammography on 12/02/21 showing a possible abnormality in the right breast. She underwent unilateral right diagnostic mammography with tomography and right breast ultrasonography at [facility] on 12/25/21 showing an indeterminate right breast mass, located at the 9:30 o'clock position . ? ?Right breast 9:30 o'clock biopsy on 12/30/21 showed: intermediate grade ductal carcinoma in-situ, measuring 0.4 cm in the greatest linear extent. Prognostic indicators significant for: estrogen receptor, 100% positive and progesterone receptor, 100% positive, both with strong staining intensity. HER2 not assessed.  ? ?Menarche: 76 years old ?Age at first live birth: 76 years old ?GP: 2 ?LMP: when she was 76 years old  ?Contraceptive: for a few years in the 70's she had an IUD and used pills  ?HRT: never used  ? ?The patient was referred today for presentation in the multidisciplinary conference.  Radiology studies and pathology slides were presented there for review and discussion of treatment options.  A consensus was discussed regarding potential next  steps. ? ?PREVIOUS RADIATION THERAPY: No ? ?PAST MEDICAL HISTORY: No history of breast cancer ?Past Medical History:  ?Diagnosis Date  ? Adenomatous polyp 11/23/2005  ? Anxiety   ? Basal cell carcinoma 2021  ? right cheek per patient at [place] [facility]  ? Chronic diastolic CHF (congestive heart failure) ([place])   ? Coronary artery disease   ? a. HC on 08/14/16 showed RCA CTO s/p PCI, 60% LCX disease managed medically with recs to consider staged PCI if continued symptoms.   ? DIVERTICULOSIS, COLON 04/22/2007  ?     ? GERD (gastroesophageal reflux disease)   ? History of shingles   ? Hypertension   ? Hypertensive heart disease   ? Internal hemorrhoids   ? Osteoarthritis   ? "knees, hands, lower back" (08/14/2016)  ? PUD (peptic ulcer disease)   ? Sleep apnea   ? a. resolved with weight loss  ? ? ?PAST SURGICAL HISTORY: ?Past Surgical History:  ?Procedure Laterality Date  ? BREAST BIOPSY Right 12/30/2021  ? BREAST EXCISIONAL BIOPSY Left   ? 1980's x 3 - all benign  ? BREAST EXCISIONAL BIOPSY Right   ? 1980's x 1 or 2 - all benign  ? BREAST SURGERY Left 1980s  ? Fibrous Tumors removed   ? CARDIAC CATHETERIZATION N/A 08/14/2016  ? Procedure: Left Heart Cath and Coronary Angiography;  Surgeon: Sherren Mocha, MD;  Location: [place] [facility];  Service: Cardiovascular;  Laterality: N/A;  ? CARDIAC CATHETERIZATION N/A 08/14/2016  ? Procedure: Coronary Stent Intervention;  Surgeon: Sherren Mocha, MD;  Location: [facility] [facility];  Service: Cardiovascular;  Laterality: N/A;  ?  CATARACT EXTRACTION W/ INTRAOCULAR LENS  IMPLANT, BILATERAL Bilateral 05/2009  ? CORONARY ANGIOPLASTY    ? detached retina left eye    ? may 2019  ? TUBAL LIGATION  1970s  ? ? ?FAMILY HISTORY:  ?Family History  ?Problem Relation Age of Onset  ? Heart disease Mother   ? Hypertension Mother   ? Diabetes Mother   ? Dementia Mother   ? COPD Father   ? Dementia Maternal Grandmother   ? Colon cancer Neg Hx   ? ? ?SOCIAL HISTORY:  ?Social History   ? ?Socioeconomic History  ? Marital status: Divorced  ?  Spouse name: Not on file  ? Number of children: Not on file  ? Years of education: Not on file  ? Highest education level: Not on file  ?Occupational History  ? Occupation: Cleaning  ?Tobacco Use  ? Smoking status: Former  ?  Packs/day: 1.00  ?  Years: 26.00  ?  Pack years: 26.00  ?  Types: Cigarettes  ?  Quit date: 1989  ?  Years since quitting: 34.2  ? Smokeless tobacco: Never  ?Vaping Use  ? Vaping Use: Never used  ?Substance and Sexual Activity  ? Alcohol use: No  ? Drug use: No  ? Sexual activity: Not Currently  ?Other Topics Concern  ? Not on file  ?Social History Narrative  ? Divorced for 61 years in 2016. 2 sons- 1 son had been in prison now living with her in 2019.  2 granddaughters with 1 living close.    ?   ? 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   ? Hobbies: watch tv-survivor, dancing with the stars, enjoy alone time. Best friend [name] patient of Dr. Yong Channel. Enjoys work, time on Teaching laboratory technician.   ? ?Social Determinants of Health  ? ?Financial Resource Strain: Low Risk   ? Difficulty of Paying Living Expenses: Not very hard  ?Food Insecurity: No Food Insecurity  ? Worried About Charity fundraiser in the Last Year: Never true  ? Ran Out of Food in the Last Year: Never true  ?Transportation Needs: No Transportation Needs  ? Lack of Transportation (Medical): No  ? Lack of Transportation (Non-Medical): No  ?Physical Activity: Sufficiently Active  ? Days of Exercise per Week: 2 days  ? Minutes of Exercise per Session: 120 min  ?Stress: Stress Concern Present  ? Feeling of Stress : To some extent  ?Social Connections: Moderately Isolated  ? Frequency of Communication with Friends and Family: More than three times a week  ? Frequency of Social Gatherings with Friends and Family: More than three times a week  ? Attends Religious Services: More than 4 times per year  ? Active Member of Clubs or  Organizations: No  ? Attends Archivist Meetings: Never  ? Marital Status: Divorced  ? ? ?ALLERGIES:  ?Allergies  ?Allergen Reactions  ? Celexa [Citalopram Hydrobromide]   ?  psychosis  ? Cephalosporins   ?  REACTION: tongue swelling  ? Clarithromycin   ?  REACTION: blisters in mouth  ? Doxycycline Hyclate   ?  REACTION: nausea, vomiting  ? Levofloxacin   ?  REACTION: tongue swells  ? Penicillins   ?  REACTION: per patient causes rash,hives  ? Rosuvastatin Other (See Comments)  ?  Pt reports causes bilateral lower extremity muscle aches.   ? Zolpidem Tartrate   ?  REACTION:  difficulty with concentration  ? ? ?MEDICATIONS:  ?Current Outpatient Medications  ?Medication Sig Dispense Refill  ? acetaminophen (TYLENOL) 325 MG tablet Take 650 mg by mouth every 6 (six) hours as needed.    ? albuterol (VENTOLIN HFA) 108 (90 Base) MCG/ACT inhaler Inhale 2 puffs into the lungs every 6 (six) hours as needed for wheezing or shortness of breath. 18 g 5  ? ALPRAZolam (XANAX) 1 MG tablet TAKE ONE TABLET AT BEDTIME AS NEEDED FORSLEEP -DO NOT DRIVE FOR 8 HOURS AFTER TAKING 90 tablet 1  ? amLODipine (NORVASC) 10 MG tablet Take 1 tablet (10 mg total) by mouth daily. 90 tablet 3  ? aspirin EC 81 MG tablet Take 1 tablet (81 mg total) by mouth daily. Swallow whole. 90 tablet 3  ? Bempedoic Acid-Ezetimibe (NEXLIZET) 180-10 MG TABS Take 180 mg by mouth daily. 90 tablet 3  ? brimonidine (ALPHAGAN) 0.2 % ophthalmic solution Place 1 drop into the left eye 2 (two) times daily.    ? budesonide-formoterol (SYMBICORT) 160-4.5 MCG/ACT inhaler Inhale 2 puffs into the lungs 2 (two) times daily. 1 each 11  ? carvedilol (COREG) 6.25 MG tablet Take 1 tablet (6.25 mg total) by mouth 2 (two) times daily. 180 tablet 3  ? famotidine (PEPCID) 20 MG tablet TAKE ONE TABLET TWICE DAILY 60 tablet 5  ? irbesartan (AVAPRO) 300 MG tablet Take 1 tablet (300 mg total) by mouth daily. 90 tablet 2  ? levocetirizine (XYZAL) 5 MG tablet TAKE ONE TABLET EVERY  MORNING 90 tablet 0  ? Magnesium 200 MG TABS Take 1 tablet (200 mg total) by mouth daily. 30 each   ? nitroGLYCERIN (NITROSTAT) 0.4 MG SL tablet Place 1 tablet (0.4 mg total) under the tongue every 5 (five) minut

## 2022-01-07 ENCOUNTER — Ambulatory Visit: Payer: Medicare Other | Admitting: Physical Therapy

## 2022-01-07 ENCOUNTER — Encounter: Payer: Self-pay | Admitting: *Deleted

## 2022-01-07 ENCOUNTER — Ambulatory Visit: Payer: Self-pay | Admitting: Surgery

## 2022-01-07 ENCOUNTER — Encounter: Payer: Self-pay | Admitting: Hematology and Oncology

## 2022-01-07 ENCOUNTER — Other Ambulatory Visit: Payer: Self-pay | Admitting: *Deleted

## 2022-01-07 ENCOUNTER — Inpatient Hospital Stay: Payer: Medicare Other

## 2022-01-07 ENCOUNTER — Inpatient Hospital Stay: Payer: Medicare Other | Attending: Hematology and Oncology | Admitting: Hematology and Oncology

## 2022-01-07 ENCOUNTER — Other Ambulatory Visit: Payer: Self-pay

## 2022-01-07 ENCOUNTER — Inpatient Hospital Stay: Payer: Medicare Other | Admitting: Licensed Clinical Social Worker

## 2022-01-07 ENCOUNTER — Ambulatory Visit (HOSPITAL_BASED_OUTPATIENT_CLINIC_OR_DEPARTMENT_OTHER): Payer: Medicare Other | Admitting: Genetic Counselor

## 2022-01-07 ENCOUNTER — Ambulatory Visit
Admission: RE | Admit: 2022-01-07 | Discharge: 2022-01-07 | Disposition: A | Discharge status: Home / Self Care | Payer: Medicare Other | Source: Ambulatory Visit | Attending: Radiation Oncology | Admitting: Radiation Oncology

## 2022-01-07 ENCOUNTER — Telehealth: Payer: Self-pay | Admitting: *Deleted

## 2022-01-07 DIAGNOSIS — Z006 Encounter for examination for normal comparison and control in clinical research program: Secondary | ICD-10-CM

## 2022-01-07 DIAGNOSIS — D0511 Intraductal carcinoma in situ of right breast: Secondary | ICD-10-CM

## 2022-01-07 DIAGNOSIS — Z87891 Personal history of nicotine dependence: Secondary | ICD-10-CM | POA: Diagnosis not present

## 2022-01-07 LAB — CBC WITH DIFFERENTIAL (CANCER CENTER ONLY)
Abs Immature Granulocytes: 0.24 10*3/uL — ABNORMAL HIGH (ref 0.00–0.07)
Basophils Absolute: 0.1 10*3/uL (ref 0.0–0.1)
Basophils Relative: 1 %
Eosinophils Absolute: 0.1 10*3/uL (ref 0.0–0.5)
Eosinophils Relative: 0 %
HCT: 40.7 % (ref 36.0–46.0)
Hemoglobin: 14 g/dL (ref 12.0–15.0)
Immature Granulocytes: 2 %
Lymphocytes Relative: 14 %
Lymphs Abs: 2 10*3/uL (ref 0.7–4.0)
MCH: 30.2 pg (ref 26.0–34.0)
MCHC: 34.4 g/dL (ref 30.0–36.0)
MCV: 87.9 fL (ref 80.0–100.0)
Monocytes Absolute: 1.1 10*3/uL — ABNORMAL HIGH (ref 0.1–1.0)
Monocytes Relative: 8 %
Neutro Abs: 11 10*3/uL — ABNORMAL HIGH (ref 1.7–7.7)
Neutrophils Relative %: 75 %
Platelet Count: 359 10*3/uL (ref 150–400)
RBC: 4.63 MIL/uL (ref 3.87–5.11)
RDW: 12.4 % (ref 11.5–15.5)
WBC Count: 14.5 10*3/uL — ABNORMAL HIGH (ref 4.0–10.5)
nRBC: 0 % (ref 0.0–0.2)

## 2022-01-07 LAB — CMP (CANCER CENTER ONLY)
ALT: 15 U/L (ref 0–44)
AST: 13 U/L — ABNORMAL LOW (ref 15–41)
Albumin: 4.3 g/dL (ref 3.5–5.0)
Alkaline Phosphatase: 76 U/L (ref 38–126)
Anion gap: 5 (ref 5–15)
BUN: 39 mg/dL — ABNORMAL HIGH (ref 8–23)
CO2: 32 mmol/L (ref 22–32)
Calcium: 9.4 mg/dL (ref 8.9–10.3)
Chloride: 103 mmol/L (ref 98–111)
Creatinine: 0.9 mg/dL (ref 0.44–1.00)
GFR, Estimated: >60 mL/min (ref >60)
Glucose, Bld: 203 mg/dL — ABNORMAL HIGH (ref 70–99)
Potassium: 4.1 mmol/L (ref 3.5–5.1)
Sodium: 140 mmol/L (ref 135–145)
Total Bilirubin: 0.9 mg/dL (ref 0.3–1.2)
Total Protein: 7.5 g/dL (ref 6.5–8.1)

## 2022-01-07 LAB — RESEARCH LABS

## 2022-01-07 LAB — GENETIC SCREENING ORDER

## 2022-01-07 NOTE — Research (Signed)
MTG-015 - Tissue and Bodily Fluids: Translational Medicine: Discovery and Evaluation of Biomarkers/Pharmacogenomics for the Diagnosis and Personalized Management of Patients  ? ?01/07/22 ?This Coordinator has reviewed this patient's inclusion and exclusion criteria and confirmed Felicia Perry is eligible for study participation.  Patient will continue with enrollment. ? ?Clabe Seal ?Clinical Research Coordinator I  ?01/07/22 1:10 PM ?

## 2022-01-07 NOTE — Assessment & Plan Note (Signed)
This is a very pleasant 76 year old female patient with right breast DCIS referred to breast [place] for additional recommendation.  We have discussed the following details about DCIS. ? ?Pathology review: I discussed with the patient the difference between DCIS and invasive breast cancer. It is considered a precancerous lesion. DCIS is classified as a Stage 0 breast cancer. It is generally detected through mammograms as calcifications. We discussed the significance of grades and its impact on prognosis. We also discussed the importance of ER and PR receptors and their implications to adjuvant treatment options. Prognosis of DCIS dependence on grade and degree of comedo necrosis. It is anticipated that if not treated, 20-30% of DCIS can develop into invasive breast cancer. ? ?Recommendation: ?1. Breast conserving surgery ?2. Followed by adjuvant radiation therapy ?3. Followed by antiestrogen therapy with tamoxifen/aromatase inhibitors based on menopausal status ?5 years ? ?I have primarily focused on discussion about aromatase inhibitors today. ? ?Aromatase inhibitors counseling: We have discussed the mechanism of action of aromatase inhibitors today.  We have discussed adverse effects including but not limited to menopausal symptoms, increased risk of osteoporosis and fractures, cardiovascular events, arthralgias and myalgias.  We do believe that the benefits far outweigh the risks.  Plan treatment duration of 5 years. ? ?Thank you for consulting [place] in the care of this patient.  Please not hesitate to contact us with any additional questions or concerns. ? ?She will return to clinic after surgery to discuss about antiestrogen therapy.  I would favor using aromatase inhibitors for this patient.  She is agreeable to 5 years of antiestrogen therapy. ? ? ?

## 2022-01-07 NOTE — Progress Notes (Signed)
[facility] ?CONSULT NOTE ? ?Patient Care Team: ?Marin Olp, MD as PCP - General (Family Medicine) ?[name], MD as PCP - Cardiology (Cardiology) ?[name], DO as Consulting Physician (Sports Medicine) ?Starlyn Skeans as Librarian, academic (Dermatology) ?Edythe Clarity, [facility] as Pharmacist (Pharmacist) ?Mauro Kaufmann, RN as Oncology Nurse Navigator ?Rockwell Germany, RN as Oncology Nurse Navigator ?Erroll Luna, MD as Consulting Physician (General Surgery) ?[name], MD as Consulting Physician (Hematology and Oncology) ?Gery Pray, MD as Consulting Physician (Radiation Oncology) ? ?CHIEF COMPLAINTS/PURPOSE OF CONSULTATION:  ?Newly diagnosed breast cancer ? ?HISTORY OF PRESENTING ILLNESS:  ?Felicia Perry 76 y.o. female is here because of recent diagnosis of right breast DCIS ? ?12/02/2021, mammogram showed possible mass in the right breast.  In the left breast no findings suspicious for malignancy ?12/25/2021 diagnostic mammogram showed indeterminate right breast mass at 930 o'clock. ?12/30/2021, ultrasound-guided biopsy of the breast mass showed ductal carcinoma in situ, ER 100%, positive, strong staining, PR 100%, positive, strong staining ?Patient is presented in breast [place] for additional recommendations.  Felicia Perry arrived to the appointment today with her daughter-in-law.  She denies any palpable abnormalities in the breast or prior diagnosis of malignancy. ?She has past medical history of borderline diabetes, coronary artery disease status post 3 stents back in 2017.  She had fatty tumor removed from the breast a while ago.  No family history of any malignancy. ?I reviewed her records extensively and collaborated the history with the patient. ? ?SUMMARY OF ONCOLOGIC HISTORY: ?Oncology History  ?Ductal carcinoma in situ (DCIS) of right breast  ?12/18/2021 Cancer Staging  ? Staging form: Breast, AJCC 8th Edition ?- Clinical stage from 12/18/2021:  Stage 0 (cTis (DCIS), cN0, cM0, ER+, PR+, HER2: Not Assessed) - Signed by [name], MD on 01/07/2022 ?Stage prefix: Initial diagnosis ?Nuclear grade: G2 ?Laterality: Right ?Stage used in treatment planning: Yes ?National guidelines used in treatment planning: Yes ?Type of national guideline used in treatment planning: NCCN ? ?  ?01/05/2022 Initial Diagnosis  ? Ductal carcinoma in situ (DCIS) of right breast ?  ? ? ? ?MEDICAL HISTORY:  ?Past Medical History:  ?Diagnosis Date  ? Adenomatous polyp 11/23/2005  ? Anxiety   ? Basal cell carcinoma 2021  ? right cheek per patient at [place] [facility]  ? Chronic diastolic CHF (congestive heart failure) ([place])   ? Coronary artery disease   ? a. HC on 08/14/16 showed RCA CTO s/p PCI, 60% LCX disease managed medically with recs to consider staged PCI if continued symptoms.   ? DIVERTICULOSIS, COLON 04/22/2007  ?     ? GERD (gastroesophageal reflux disease)   ? History of shingles   ? Hypertension   ? Hypertensive heart disease   ? Internal hemorrhoids   ? Osteoarthritis   ? "knees, hands, lower back" (08/14/2016)  ? PUD (peptic ulcer disease)   ? Sleep apnea   ? a. resolved with weight loss  ? ? ?SURGICAL HISTORY: ?Past Surgical History:  ?Procedure Laterality Date  ? BREAST BIOPSY Right 12/30/2021  ? BREAST EXCISIONAL BIOPSY Left   ? 1980's x 3 - all benign  ? BREAST EXCISIONAL BIOPSY Right   ? 1980's x 1 or 2 - all benign  ? BREAST SURGERY Left 1980s  ? Fibrous Tumors removed   ? CARDIAC CATHETERIZATION N/A 08/14/2016  ? Procedure: Left Heart Cath and Coronary Angiography;  Surgeon: Sherren Mocha, MD;  Location: [facility];  Service:  Cardiovascular;  Laterality: N/A;  ? CARDIAC CATHETERIZATION N/A 08/14/2016  ? Procedure: Coronary Stent Intervention;  Surgeon: Sherren Mocha, MD;  Location: [facility];  Service: Cardiovascular;  Laterality: N/A;  ? CATARACT EXTRACTION W/ INTRAOCULAR LENS  IMPLANT, BILATERAL Bilateral 05/2009  ? CORONARY ANGIOPLASTY    ?  detached retina left eye    ? may 2019  ? TUBAL LIGATION  1970s  ? ? ?SOCIAL HISTORY: ?Social History  ? ?Socioeconomic History  ? Marital status: Divorced  ?  Spouse name: Not on file  ? Number of children: Not on file  ? Years of education: Not on file  ? Highest education level: Not on file  ?Occupational History  ? Occupation: Cleaning  ?Tobacco Use  ? Smoking status: Former  ?  Packs/day: 1.00  ?  Years: 26.00  ?  Pack years: 26.00  ?  Types: Cigarettes  ?  Quit date: 1989  ?  Years since quitting: 34.2  ? Smokeless tobacco: Never  ?Vaping Use  ? Vaping Use: Never used  ?Substance and Sexual Activity  ? Alcohol use: No  ? Drug use: No  ? Sexual activity: Not Currently  ?Other Topics Concern  ? Not on file  ?Social History Narrative  ? Divorced for 78 years in 2016. 2 sons- 1 son had been in prison now living with her in 2019.  2 granddaughters with 1 living close.    ?   ? 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   ? Hobbies: watch tv-survivor, dancing with the stars, enjoy alone time. Best friend [name] patient of Dr. Yong Channel. Enjoys work, time on Teaching laboratory technician.   ? ?Social Determinants of Health  ? ?Financial Resource Strain: Low Risk   ? Difficulty of Paying Living Expenses: Not hard at all  ?Food Insecurity: No Food Insecurity  ? Worried About Charity fundraiser in the Last Year: Never true  ? Ran Out of Food in the Last Year: Never true  ?Transportation Needs: No Transportation Needs  ? Lack of Transportation (Medical): No  ? Lack of Transportation (Non-Medical): No  ?Physical Activity: Sufficiently Active  ? Days of Exercise per Week: 2 days  ? Minutes of Exercise per Session: 120 min  ?Stress: Stress Concern Present  ? Feeling of Stress : To some extent  ?Social Connections: Moderately Isolated  ? Frequency of Communication with Friends and Family: More than three times a week  ? Frequency of Social Gatherings with Friends and Family: More than  three times a week  ? Attends Religious Services: More than 4 times per year  ? Active Member of Clubs or Organizations: No  ? Attends Archivist Meetings: Never  ? Marital Status: Divorced  ?Intimate Partner Violence: Not At Risk  ? Fear of Current or Ex-Partner: No  ? Emotionally Abused: No  ? Physically Abused: No  ? Sexually Abused: No  ? ? ?FAMILY HISTORY: ?Family History  ?Problem Relation Age of Onset  ? Heart disease Mother   ? Hypertension Mother   ? Diabetes Mother   ? Dementia Mother   ? COPD Father   ? Dementia Maternal Grandmother   ? Colon cancer Neg Hx   ? ? ?ALLERGIES:  is allergic to celexa [citalopram hydrobromide], cephalosporins, clarithromycin, doxycycline hyclate, levofloxacin, penicillins, rosuvastatin, and zolpidem tartrate. ? ?MEDICATIONS:  ?Current Outpatient Medications  ?Medication Sig Dispense Refill  ? acetaminophen (TYLENOL)  325 MG tablet Take 650 mg by mouth every 6 (six) hours as needed.    ? albuterol (VENTOLIN HFA) 108 (90 Base) MCG/ACT inhaler Inhale 2 puffs into the lungs every 6 (six) hours as needed for wheezing or shortness of breath. 18 g 5  ? ALPRAZolam (XANAX) 1 MG tablet TAKE ONE TABLET AT BEDTIME AS NEEDED FORSLEEP -DO NOT DRIVE FOR 8 HOURS AFTER TAKING 90 tablet 1  ? amLODipine (NORVASC) 10 MG tablet Take 1 tablet (10 mg total) by mouth daily. 90 tablet 3  ? aspirin EC 81 MG tablet Take 1 tablet (81 mg total) by mouth daily. Swallow whole. 90 tablet 3  ? Bempedoic Acid-Ezetimibe (NEXLIZET) 180-10 MG TABS Take 180 mg by mouth daily. 90 tablet 3  ? brimonidine (ALPHAGAN) 0.2 % ophthalmic solution Place 1 drop into the left eye 2 (two) times daily.    ? budesonide-formoterol (SYMBICORT) 160-4.5 MCG/ACT inhaler Inhale 2 puffs into the lungs 2 (two) times daily. 1 each 11  ? carvedilol (COREG) 6.25 MG tablet Take 1 tablet (6.25 mg total) by mouth 2 (two) times daily. 180 tablet 3  ? famotidine (PEPCID) 20 MG tablet TAKE ONE TABLET TWICE DAILY 60 tablet 5  ?  irbesartan (AVAPRO) 300 MG tablet Take 1 tablet (300 mg total) by mouth daily. 90 tablet 2  ? levocetirizine (XYZAL) 5 MG tablet TAKE ONE TABLET EVERY MORNING 90 tablet 0  ? Magnesium 200 MG TABS Take 1 tablet (200

## 2022-01-07 NOTE — Progress Notes (Signed)
[facility] Clinical Social Work  ?Initial Assessment ? ? ?Felicia Perry is a 76 y.o. year old female accompanied by daughter in law, Felicia Perry . Clinical Social Work was referred by  [facility]  for assessment of psychosocial needs.  ? ?SDOH (Social Determinants of Health) assessments performed: Yes ?SDOH Interventions   ? ?Flowsheet Row Most Recent Value  ?SDOH Interventions   ?Food Insecurity Interventions Intervention Not Indicated  ?Financial Strain Interventions Intervention Not Indicated  ?Housing Interventions Intervention Not Indicated  ?Transportation Interventions Intervention Not Indicated  ? ?  ?  ?Distress Screen completed: Yes ?ONCBCN DISTRESS SCREENING 01/07/2022  ?Screening Type Initial Screening  ?Distress experienced in past week (1-10) 1  ?Practical problem type Work/school  ?Emotional problem type Adjusting to illness;Nervousness/Anxiety  ?Spiritual/Religous concerns type Loss of sense of purpose  ?Physical Problem type Skin dry/itchy  ? ? ? ? ?Family/Social Information:  ?Housing Arrangement: Patient lives in her own home. Her son Felicia Perry) and daughter in law Felicia Perry) currently live with her ?Family members/support persons in your life? Family- Felicia Perry and other son (lives in [place]) ?Transportation concerns: no  ?Employment: Works part-time in her own business cleaning homes. Income source: Employment and [employer] ?Financial concerns: No ?Type of concern: None ?Food access concerns: no ?Services Currently in place:  n/a ? ?Coping/ Adjustment to diagnosis: ?Patient understands treatment plan and what happens next? yes ?Concerns about diagnosis and/or treatment:  General adjustment ?Patient reported stressors:  some stress at home with son, stress with trying to get Xanax refilled- working on this with PCP ?Patient enjoys  her business, her three dogs ?Current coping skills/ strengths: Capable of independent living  and Supportive family/friends  ? ? ? SUMMARY: ?Current SDOH Barriers:  ?No  significant SDOH concerns at this time ? ?Clinical Social Work Clinical Goal(s):  ?No clinical SW goals at this time ? ?Interventions: ?Discussed common feeling and emotions when being diagnosed with cancer, and the importance of support during treatment ?Informed patient of the support team roles and support services at [facility] ?Provided CSW contact information and encouraged patient to call with any questions or concerns ? ? ? ?Follow Up Plan: Patient will contact CSW with any support or resource needs ?Patient verbalizes understanding of plan: Yes ? ? ? ?Aayana Reinertsen E Hailee Hollick, LCSW ?

## 2022-01-07 NOTE — Telephone Encounter (Signed)
? ?  Pre-operative Risk Assessment  ?  ?Patient Name: Felicia Perry  ?DOB: 1946-05-25 ?MRN: [id number]  ? ?  ? ?Request for Surgical Clearance   ? ?Procedure:   LUMPECTOMY ? ?Date of Surgery:  Clearance TBD                              ?   ?Surgeon:  Erroll Luna, MD ?Surgeon's Group or Practice Name:  [facility] ?Phone number:  [phone] ?Fax number:  [phone]  ATTN:  Carlene Coria, CMA ?  ?Type of Clearance Requested:   ?- Medical  ?- Pharmacy:  Hold Aspirin NOT INDICATED ?  ?Type of Anesthesia:  General  ?  ?Additional requests/questions:   ? ?Signed, ?Jeanann Lewandowsky   ?01/07/2022, 3:10 PM  ? ?

## 2022-01-07 NOTE — Telephone Encounter (Signed)
? ?  Name: Felicia Perry  ?DOB: December 22, 1945  ?MRN: [id number]  ? ?Primary Cardiologist: [name], MD ? ?Chart reviewed as part of pre-operative protocol coverage. Patient was contacted 01/07/2022 in reference to pre-operative risk assessment for pending surgery as outlined below.  Felicia Perry was last seen 12/24/2021 by Dr. Johney Frame.  Since that day, Felicia Perry has done well without exertional chest pain or worsening dyspnea.  She was able to accomplish more than 4 METS of activity without any issue. ? ?Therefore, based on ACC/AHA guidelines, the patient would be at acceptable risk for the planned procedure without further cardiovascular testing.  ? ?Will forward to Dr. Johney Frame to see if the patient can come off of aspirin for 5 to 7 days prior to lumpectomy surgery ? ?Dr. Johney Frame, please forward your recommendation to P CV DIV PREOP ? ? ? ?Almyra Deforest, [place] ?01/07/2022, 5:27 PM ? ?

## 2022-01-07 NOTE — Research (Addendum)
Trial: MTG-015 - Tissue and Bodily Fluids: Translational Medicine: Discovery and Evaluation of Biomarkers/Pharmacogenomics for the Diagnosis and Personalized Management of Patients   ? ?Patient Felicia Perry was identified by Dr. Chryl Heck as a potential candidate for the above listed study.  This Clinical Research Nurse met with Felicia Perry, [id number], on 01/07/22 in a manner and location that ensures patient privacy to discuss participation in the above listed research study.  Patient is Accompanied by her daughter in law, Felicia Perry .  A copy of the informed consent document and separate HIPAA Authorization was provided to the patient.  Patient reads, speaks, and understands [place].   ? ?Patient was provided the option of taking informed consent documents home to review and was encouraged to review at their convenience with their support network, including other care providers. Patient is comfortable with making a decision regarding study participation today. ? ?As outlined in the informed consent form, this Nurse and Kenzi Loleta Chance discussed the purpose of the research study, the investigational nature of the study, study procedures and requirements for study participation, potential risks and benefits of study participation, as well as alternatives to participation. This study is not blinded. The patient understands participation is voluntary and they may withdraw from study participation at any time.  This study does not involve randomization.  This study does not involve an investigational drug or device. This study does not involve a placebo. Patient understands enrollment is pending full eligibility review.  ? ?Confidentiality and how the patient's information will be used as part of study participation were discussed.  Patient was informed there is reimbursement provided for their time and effort spent on trial participation.    ? ?All questions were answered to patient's satisfaction.  The informed  consent and separate HIPAA Authorization was reviewed page by page.  The patient's mental and emotional status is appropriate to provide informed consent, and the patient verbalizes an understanding of study participation.  Patient has agreed to participate in the above listed research study and has voluntarily signed the informed consent IRB approved 09/15/2021, on 01/07/22 at 12:00PM.  The patient was provided with a copy of the signed informed consent form and separate HIPAA Authorization for their reference.  No study specific procedures were obtained prior to the signing of the informed consent document.  Approximately 20 minutes were spent with the patient reviewing the informed consent documents.  Patient was not requested to complete a Release of Information form.  ? ?Data Collection: Patient agreed for research nurse to call her in the coming week to collect data for the study including medical history, medications, smoking, alcohol and family cancer history.  She did not want to review this today as she has already had a long day in clinic.  ?  ?Lab: Blood drawn for study per protocol.  Patient tolerated well without any adverse events.  ?  ?Gift Card: $50 gift card was provided to patient after the blood collection.  ? ?Patient was thanked for her participation and provided with the business card of this Nurse and encouraged to contact the research team with any questions before our next contact.  ?  ?This Nurse has reviewed this patient's inclusion and exclusion criteria and confirmed patient is eligible for study participation.  Patient will continue with enrollment.   ?Patient had a skin biopsy yesterday and results are not available yet. This was discussed with the study manager, Wonda Horner, and she informed it was up to our site whether  we decide to enroll patient or not with the possibility of a screen failure if biopsy turns out to be melanoma.  Discussed with Geophysicist/field seismologist, Doreatha Martin,  and we agreed we will continue with enrollment and if the skin biopsy reveals melanoma we will notify the study.  ? ?Foye Spurling, BSN, RN, CCRP ?Clinical Research Nurse II ?01/07/2022 1:58 PM ? ? ? ? ?

## 2022-01-08 ENCOUNTER — Other Ambulatory Visit: Payer: Medicare Other

## 2022-01-08 DIAGNOSIS — H209 Unspecified iridocyclitis: Secondary | ICD-10-CM | POA: Diagnosis not present

## 2022-01-08 NOTE — Telephone Encounter (Signed)
? ? ?  Patient Name: Felicia Perry  ?DOB: 1945-12-14 ?MRN: [id number] ? ?Primary Cardiologist: [name], MD ? ?Chart reviewed as part of pre-operative protocol coverage. Per [employer], PA-C, note below: "Patient was contacted 01/07/2022 in reference to pre-operative risk assessment for pending surgery as outlined below.  Chamaine H Czerwonka was last seen 12/24/2021 by Dr. Johney Frame.  Since that day, Marlet H Losee has done well without exertional chest pain or worsening dyspnea.  She was able to accomplish more than 4 METS of activity without any issue. Therefore, based on ACC/AHA guidelines, the patient would be at acceptable risk for the planned procedure without further cardiovascular testing." ? ?Per Dr. Johney Frame, okay to hold Aspirin for 5-7 days prior to surgery but please restart this as soon as safely possible afterwards.  ? ?I will route this recommendation to the requesting party via Epic fax function and remove from pre-op pool. ? ?Please call with questions. ? ?Darreld Mclean, PA-C ?01/08/2022, 10:46 AM ? ?

## 2022-01-08 NOTE — Progress Notes (Signed)
REFERRING PROVIDER: ?[name], MD ?[place] ?[place], [place] [address] ? ?PRIMARY PROVIDER:  ?Marin Olp, MD ? ?PRIMARY REASON FOR VISIT:  ?Encounter Diagnosis  ?Name Primary?  ? Ductal carcinoma in situ (DCIS) of right breast Yes  ? ?HISTORY OF PRESENT ILLNESS:   ?Felicia Perry, a 76 y.o. female, was seen for a [facility] cancer genetics consultation during the breast multidisciplinary clinic at the request of Dr. Chryl Heck due to a personal history of cancer.  Felicia Perry presents to clinic today to discuss the possibility of a hereditary predisposition to cancer, to discuss genetic testing, and to further clarify her future cancer risks, as well as potential cancer risks for family members.  ? ?In March 2023, at the age of 60, Felicia Perry was diagnosed with ductal carcinoma in situ of the right breast.  ? ?CANCER HISTORY:  ?Oncology History  ?Ductal carcinoma in situ (DCIS) of right breast  ?12/18/2021 Cancer Staging  ? Staging form: Breast, AJCC 8th Edition ?- Clinical stage from 12/18/2021: Stage 0 (cTis (DCIS), cN0, cM0, ER+, PR+, HER2: Not Assessed) - Signed by [name], MD on 01/07/2022 ?Stage prefix: Initial diagnosis ?Nuclear grade: G2 ?Laterality: Right ?Stage used in treatment planning: Yes ?National guidelines used in treatment planning: Yes ?Type of national guideline used in treatment planning: NCCN ? ?  ?01/05/2022 Initial Diagnosis  ? Ductal carcinoma in situ (DCIS) of right breast ?  ? ? ? ?RISK FACTORS:  ?Menarche was at age 50.  ?First live birth at age 30.  ?OCP use for "a few" years.  ?Ovaries intact: yes.  ?Uterus intact: yes.  ?Menopausal status: postmenopausal.  ?HRT use: 0 years. ?Colonoscopy: yes, reports her most recent colonoscopy was in 2016 ?Mammogram within the last year: yes. ?Any excessive radiation exposure in the past: no ? ?Past Medical History:  ?Diagnosis Date  ? Adenomatous polyp 11/23/2005  ? Anxiety   ? Basal cell carcinoma 2021  ? right cheek per patient  at [facility]  ? Chronic diastolic CHF (congestive heart failure) ([facility])   ? Coronary artery disease   ? a. HC on 08/14/16 showed RCA CTO s/p PCI, 60% LCX disease managed medically with recs to consider staged PCI if continued symptoms.   ? DIVERTICULOSIS, COLON 04/22/2007  ?     ? GERD (gastroesophageal reflux disease)   ? History of shingles   ? Hypertension   ? Hypertensive heart disease   ? Internal hemorrhoids   ? Osteoarthritis   ? "knees, hands, lower back" (08/14/2016)  ? PUD (peptic ulcer disease)   ? Sleep apnea   ? a. resolved with weight loss  ? ? ?Past Surgical History:  ?Procedure Laterality Date  ? BREAST BIOPSY Right 12/30/2021  ? BREAST EXCISIONAL BIOPSY Left   ? 1980's x 3 - all benign  ? BREAST EXCISIONAL BIOPSY Right   ? 1980's x 1 or 2 - all benign  ? BREAST SURGERY Left 1980s  ? Fibrous Tumors removed   ? CARDIAC CATHETERIZATION N/A 08/14/2016  ? Procedure: Left Heart Cath and Coronary Angiography;  Surgeon: Sherren Mocha, MD;  Location: [facility];  Service: Cardiovascular;  Laterality: N/A;  ? CARDIAC CATHETERIZATION N/A 08/14/2016  ? Procedure: Coronary Stent Intervention;  Surgeon: Sherren Mocha, MD;  Location: [facility];  Service: Cardiovascular;  Laterality: N/A;  ? CATARACT EXTRACTION W/ INTRAOCULAR LENS  IMPLANT, BILATERAL Bilateral 05/2009  ? CORONARY ANGIOPLASTY    ? detached retina left eye    ?  may 2019  ? TUBAL LIGATION  1970s  ? ? ?Social History  ? ?Socioeconomic History  ? Marital status: Divorced  ?  Spouse name: Not on file  ? Number of children: Not on file  ? Years of education: Not on file  ? Highest education level: Not on file  ?Occupational History  ? Occupation: Cleaning  ?Tobacco Use  ? Smoking status: Former  ?  Packs/day: 1.00  ?  Years: 26.00  ?  Pack years: 26.00  ?  Types: Cigarettes  ?  Quit date: 1989  ?  Years since quitting: 34.2  ? Smokeless tobacco: Never  ?Vaping Use  ? Vaping Use: Never used  ?Substance and Sexual Activity  ?  Alcohol use: No  ? Drug use: No  ? Sexual activity: Not Currently  ?Other Topics Concern  ? Not on file  ?Social History Narrative  ? Divorced for 70 years in 2016. 2 sons- 1 son had been in prison now living with her in 2019.  2 granddaughters with 1 living close.    ?   ? 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   ? Hobbies: watch tv-survivor, dancing with the stars, enjoy alone time. Best friend [name] patient of Dr. Yong Channel. Enjoys work, time on Teaching laboratory technician.   ? ?Social Determinants of Health  ? ?Financial Resource Strain: Low Risk   ? Difficulty of Paying Living Expenses: Not very hard  ?Food Insecurity: No Food Insecurity  ? Worried About Charity fundraiser in the Last Year: Never true  ? Ran Out of Food in the Last Year: Never true  ?Transportation Needs: No Transportation Needs  ? Lack of Transportation (Medical): No  ? Lack of Transportation (Non-Medical): No  ?Physical Activity: Sufficiently Active  ? Days of Exercise per Week: 2 days  ? Minutes of Exercise per Session: 120 min  ?Stress: Stress Concern Present  ? Feeling of Stress : To some extent  ?Social Connections: Moderately Isolated  ? Frequency of Communication with Friends and Family: More than three times a week  ? Frequency of Social Gatherings with Friends and Family: More than three times a week  ? Attends Religious Services: More than 4 times per year  ? Active Member of Clubs or Organizations: No  ? Attends Archivist Meetings: Never  ? Marital Status: Divorced  ?  ? ?FAMILY HISTORY:  ?We obtained a detailed, 4-generation family history.  ? ?Felicia Perry reported no family history of cancer. She is unaware of previous family history of genetic testing for hereditary cancer risks. There is no reported Ashkenazi Jewish ancestry.  ? ? ? ? ?GENETIC COUNSELING ASSESSMENT: Felicia Perry is a 77 y.o. female with a personal history of breast cancer. We discussed and recommended the  following at today's visit.  ? ?DISCUSSION: We discussed that 5 - 10% of cancer is hereditary, with most cases of hereditary breast cancer associated with mutations in BRCA1/2.  There are other genes that can be associated with hereditary breast cancer syndromes. Type of cancer risk and level of risk are gene-specific. We discussed that testing is beneficial for several reasons including knowing how to follow individuals after completing their treatment, identifying whether potential treatment options would be beneficial, and understanding if other family members could be at risk for cancer and allowing them to undergo genetic testing.  ? ?We reviewed the characteristics, features and inheritance patterns of  hereditary cancer syndromes. We also discussed genetic testing, including the appropriate family members to test, the process of testing, insurance coverage and turn-around-time for results. We discussed the implications of a negative, positive and/or variant of uncertain significant result.  ? ?Felicia Perry elected to have [facility] CancerNext+RNA panel. The CancerNext gene panel offered by [facility] includes sequencing, rearrangement analysis, and RNA analysis for the following 36 genes:   APC, ATM, AXIN2, BARD1, BMPR1A, BRCA1, BRCA2, BRIP1, CDH1, CDK4, CDKN2A, CHEK2, DICER1, HOXB13, EPCAM, GREM1, MLH1, MSH2, MSH3, MSH6, MUTYH, NBN, NF1, NTHL1, PALB2, PMS2, POLD1, POLE, PTEN, RAD51C, RAD51D, RECQL, SMAD4, SMARCA4, STK11, and TP53.   ? ?Based on Felicia Perry's personal history of cancer, she does not meet medical criteria for genetic testing. She may have an out of pocket cost. We discussed that if her out of pocket cost for testing is over $100, the laboratory should contact them to discuss self-pay prices, patient pay assistance programs, if applicable, and other billing options.  ? ?PLAN: After considering the risks, benefits, and limitations, Felicia Perry provided informed consent to pursue genetic testing and  the blood sample was sent to [facility] for analysis of the CancerNext Panel. Results should be available within approximately 2-3 weeks' time, at which point they will be disclosed by telephone to Ms.

## 2022-01-09 ENCOUNTER — Telehealth: Payer: Self-pay | Admitting: *Deleted

## 2022-01-09 ENCOUNTER — Other Ambulatory Visit: Payer: Self-pay | Admitting: Surgery

## 2022-01-09 ENCOUNTER — Encounter: Payer: Self-pay | Admitting: *Deleted

## 2022-01-09 DIAGNOSIS — D0511 Intraductal carcinoma in situ of right breast: Secondary | ICD-10-CM

## 2022-01-09 NOTE — Telephone Encounter (Signed)
MTG-015 - Tissue and Bodily Fluids: Translational Medicine: Discovery and Evaluation of Biomarkers/Pharmacogenomics for the Diagnosis and Personalized Management of Patients   ? ?Called patient to complete the data collection for this study.  ? ?Data Collection: 30 minutes was spent on the phone with patient collecting data for the study including medical history, medications, smoking, alcohol and family cancer history.     ?Patient does not have results from recent skin biopsy yet. She agreed that research nurse could call her in a few weeks to see if she has the results.  If they are posted in EMR then research will collect it from the EMR. ? ?Thanked patient for her time in answering the questions for this study. Encouraged patient to call research nurse if any questions. Patient verbalized understanding and states she still has my card with phone number.  ? ?Foye Spurling, BSN, RN, CCRP ?Clinical Research Nurse II ?01/09/2022 12:38 PM ? ?  ?

## 2022-01-10 ENCOUNTER — Other Ambulatory Visit: Payer: Self-pay

## 2022-01-10 ENCOUNTER — Ambulatory Visit (INDEPENDENT_AMBULATORY_CARE_PROVIDER_SITE_OTHER): Payer: Medicare Other

## 2022-01-10 DIAGNOSIS — M25562 Pain in left knee: Secondary | ICD-10-CM | POA: Diagnosis not present

## 2022-01-10 DIAGNOSIS — M1712 Unilateral primary osteoarthritis, left knee: Secondary | ICD-10-CM | POA: Diagnosis not present

## 2022-01-10 DIAGNOSIS — G8929 Other chronic pain: Secondary | ICD-10-CM

## 2022-01-10 DIAGNOSIS — M25462 Effusion, left knee: Secondary | ICD-10-CM | POA: Diagnosis not present

## 2022-01-10 DIAGNOSIS — S83242A Other tear of medial meniscus, current injury, left knee, initial encounter: Secondary | ICD-10-CM | POA: Diagnosis not present

## 2022-01-13 ENCOUNTER — Telehealth: Payer: Self-pay | Admitting: *Deleted

## 2022-01-13 ENCOUNTER — Encounter: Payer: Self-pay | Admitting: *Deleted

## 2022-01-13 DIAGNOSIS — D0511 Intraductal carcinoma in situ of right breast: Secondary | ICD-10-CM

## 2022-01-13 NOTE — Progress Notes (Signed)
Knee MRI shows arthritis is probably the main source of your pain and a meniscus tear which I think is making the arthritis worse.  I think we should proceed with the gel shots when you are able to.  The gel shots are once a week for 3 weeks.  Okay to schedule that around your other treatments.

## 2022-01-13 NOTE — Telephone Encounter (Signed)
Spoke with patient to follow from [facility] 3/15 and assess navigation needs.  Patient denies any questions or concerns at this time. Encouraged her to call should anything arise. Patient verbalized understanding.  ?

## 2022-01-14 ENCOUNTER — Encounter (HOSPITAL_BASED_OUTPATIENT_CLINIC_OR_DEPARTMENT_OTHER): Payer: Self-pay | Admitting: Surgery

## 2022-01-14 ENCOUNTER — Other Ambulatory Visit: Payer: Self-pay

## 2022-01-15 ENCOUNTER — Encounter (HOSPITAL_BASED_OUTPATIENT_CLINIC_OR_DEPARTMENT_OTHER)
Admission: RE | Admit: 2022-01-15 | Discharge: 2022-01-15 | Disposition: A | Discharge status: Home / Self Care | Payer: Medicare Other | Source: Ambulatory Visit | Attending: Surgery | Admitting: Surgery

## 2022-01-15 DIAGNOSIS — Z01810 Encounter for preprocedural cardiovascular examination: Secondary | ICD-10-CM | POA: Diagnosis not present

## 2022-01-15 DIAGNOSIS — Z1211 Encounter for screening for malignant neoplasm of colon: Secondary | ICD-10-CM | POA: Diagnosis not present

## 2022-01-15 NOTE — Progress Notes (Signed)

## 2022-01-19 ENCOUNTER — Encounter: Payer: Self-pay | Admitting: Genetic Counselor

## 2022-01-19 ENCOUNTER — Telehealth: Payer: Self-pay | Admitting: Genetic Counselor

## 2022-01-19 DIAGNOSIS — Z79899 Other long term (current) drug therapy: Secondary | ICD-10-CM | POA: Insufficient documentation

## 2022-01-19 DIAGNOSIS — Z1379 Encounter for other screening for genetic and chromosomal anomalies: Secondary | ICD-10-CM | POA: Insufficient documentation

## 2022-01-19 NOTE — Telephone Encounter (Signed)
I attempted to contact [name] to discuss her genetic testing results (36 genes). I left a voicemail requesting she call me back at [phone]. ? ?Lucille Passy, MS, LCGC ?Genetic Counselor ?[email or web address] ?(P) [phone] ? ?

## 2022-01-19 NOTE — Telephone Encounter (Signed)
I contacted Ms. Carlota Raspberry to discuss her genetic testing results. No pathogenic variants were identified in the 36 genes analyzed. Detailed clinic note to follow. ? ?The test report has been scanned into EPIC and is located under the Molecular Pathology section of the Results Review tab.  A portion of the result report is included below for reference.  ? ?Lucille Passy, MS, [place] ?Genetic Counselor ?[email or web address] ?(P) [phone] ? ? ?

## 2022-01-20 ENCOUNTER — Ambulatory Visit: Payer: Self-pay | Admitting: Genetic Counselor

## 2022-01-20 ENCOUNTER — Ambulatory Visit
Admission: RE | Admit: 2022-01-20 | Discharge: 2022-01-20 | Disposition: A | Discharge status: Home / Self Care | Payer: Medicare Other | Source: Ambulatory Visit | Attending: Surgery | Admitting: Surgery

## 2022-01-20 DIAGNOSIS — D0511 Intraductal carcinoma in situ of right breast: Secondary | ICD-10-CM | POA: Diagnosis not present

## 2022-01-20 DIAGNOSIS — Z1379 Encounter for other screening for genetic and chromosomal anomalies: Secondary | ICD-10-CM

## 2022-01-20 NOTE — Progress Notes (Signed)
HPI:   ?Felicia Perry was previously seen in the [facility] clinic due to a personal history of cancer and concerns regarding a hereditary predisposition to cancer. Please refer to our prior cancer genetics clinic note for more information regarding our discussion, assessment and recommendations, at the time. Felicia Perry recent genetic test results were disclosed to her, as were recommendations warranted by these results. These results and recommendations are discussed in more detail below. ? ?CANCER HISTORY:  ?Oncology History  ?Ductal carcinoma in situ (DCIS) of right breast  ?12/18/2021 Cancer Staging  ? Staging form: Breast, AJCC 8th Edition ?- Clinical stage from 12/18/2021: Stage 0 (cTis (DCIS), cN0, cM0, ER+, PR+, HER2: Not Assessed) - Signed by [name], MD on 01/07/2022 ?Stage prefix: Initial diagnosis ?Nuclear grade: G2 ?Laterality: Right ?Stage used in treatment planning: Yes ?National guidelines used in treatment planning: Yes ?Type of national guideline used in treatment planning: NCCN ? ?  ?01/05/2022 Initial Diagnosis  ? Ductal carcinoma in situ (DCIS) of right breast ?  ? Genetic Testing  ? [facility] CancerNext Panel was Negative. Report date is 01/17/2022. ? ?The CancerNext gene panel offered by [facility] includes sequencing, rearrangement analysis, and RNA analysis for the following 36 genes:   APC, ATM, AXIN2, BARD1, BMPR1A, BRCA1, BRCA2, BRIP1, CDH1, CDK4, CDKN2A, CHEK2, DICER1, HOXB13, EPCAM, GREM1, MLH1, MSH2, MSH3, MSH6, MUTYH, NBN, NF1, NTHL1, PALB2, PMS2, POLD1, POLE, PTEN, RAD51C, RAD51D, RECQL, SMAD4, SMARCA4, STK11, and TP53.  ?  ? ? ?FAMILY HISTORY:  ?We obtained a detailed, 4-generation family history.  ?  ?Felicia Perry reported no family history of cancer. She is unaware of previous family history of genetic testing for hereditary cancer risks. There is no reported Ashkenazi Jewish ancestry.  ?  ?  ?  ?GENETIC TEST RESULTS:  ?The [facility] CancerNext Panel found no  pathogenic mutations.  ? ?The CancerNext gene panel offered by [facility] includes sequencing, rearrangement analysis, and RNA analysis for the following 36 genes:   APC, ATM, AXIN2, BARD1, BMPR1A, BRCA1, BRCA2, BRIP1, CDH1, CDK4, CDKN2A, CHEK2, DICER1, HOXB13, EPCAM, GREM1, MLH1, MSH2, MSH3, MSH6, MUTYH, NBN, NF1, NTHL1, PALB2, PMS2, POLD1, POLE, PTEN, RAD51C, RAD51D, RECQL, SMAD4, SMARCA4, STK11, and TP53.   ? ?The test report has been scanned into EPIC and is located under the Molecular Pathology section of the Results Review tab.  A portion of the result report is included below for reference. Genetic testing reported out on 01/17/2022.  ? ? ? ? ? ?Even though a pathogenic variant was not identified, possible explanations for her personal history of cancer may include: ?There may be no hereditary risk for cancer in the family. The cancers in Felicia Perry and/or her family may be due to other genetic or environmental factors. ?There may be a gene mutation in one of these genes that current testing methods cannot detect, but that chance is small. ?There could be another gene that has not yet been discovered, or that we have not yet tested, that is responsible for the cancer diagnoses in the family.  ? ?Therefore, it is important to remain in touch with cancer genetics in the future so that we can continue to offer Felicia Perry the most up to date genetic testing.  ? ?ADDITIONAL GENETIC TESTING:  ?We discussed with Felicia Perry that her genetic testing was fairly extensive.  If there are genes identified to increase cancer risk that can be analyzed in the future, we would be happy to discuss and coordinate this  testing at that time.   ? ?CANCER SCREENING RECOMMENDATIONS:  ?Felicia Perry test result is considered negative (normal).  This means that we have not identified a hereditary cause for her personal history of cancer at this time. Most cancers happen by chance and this negative test suggests that her cancer may  fall into this category.   ? ?An individual's cancer risk and medical management are not determined by genetic test results alone. Overall cancer risk assessment incorporates additional factors, including personal medical history, family history, and any available genetic information that may result in a personalized plan for cancer prevention and surveillance. Therefore, it is recommended she continue to follow the cancer management and screening guidelines provided by her oncology and primary healthcare provider. ? ?RECOMMENDATIONS FOR FAMILY MEMBERS:   ?Since she did not inherit a mutation in a cancer predisposition gene included on this panel, her children could not have inherited a mutation from her in one of these genes. ? ?FOLLOW-UP:  ?Cancer genetics is a rapidly advancing field and it is possible that new genetic tests will be appropriate for her and/or her family members in the future. We encouraged her to remain in contact with cancer genetics on an annual basis so we can update her personal and family histories and let her know of advances in cancer genetics that may benefit this family.  ? ?Our contact number was provided. Felicia Perry questions were answered to her satisfaction, and she knows she is welcome to call us at anytime with additional questions or concerns.  ? ?Lucille Passy, MS, [employer] ?Genetic Counselor ?[email or web address] ?(P) [phone] ? ? ?

## 2022-01-22 ENCOUNTER — Ambulatory Visit (HOSPITAL_BASED_OUTPATIENT_CLINIC_OR_DEPARTMENT_OTHER): Payer: Medicare Other | Admitting: Anesthesiology

## 2022-01-22 ENCOUNTER — Ambulatory Visit (HOSPITAL_BASED_OUTPATIENT_CLINIC_OR_DEPARTMENT_OTHER)
Admission: RE | Admit: 2022-01-22 | Discharge: 2022-01-22 | Disposition: A | Discharge status: Home / Self Care | Payer: Medicare Other | Attending: Surgery | Admitting: Surgery

## 2022-01-22 ENCOUNTER — Encounter (HOSPITAL_BASED_OUTPATIENT_CLINIC_OR_DEPARTMENT_OTHER)
Admission: RE | Disposition: A | Discharge status: Home / Self Care | Payer: Self-pay | Source: Home / Self Care | Attending: Surgery

## 2022-01-22 ENCOUNTER — Other Ambulatory Visit: Payer: Self-pay

## 2022-01-22 ENCOUNTER — Encounter (HOSPITAL_BASED_OUTPATIENT_CLINIC_OR_DEPARTMENT_OTHER): Payer: Self-pay | Admitting: Surgery

## 2022-01-22 ENCOUNTER — Ambulatory Visit
Admission: RE | Admit: 2022-01-22 | Discharge: 2022-01-22 | Disposition: A | Discharge status: Home / Self Care | Payer: Medicare Other | Source: Ambulatory Visit | Attending: Surgery | Admitting: Surgery

## 2022-01-22 DIAGNOSIS — E1151 Type 2 diabetes mellitus with diabetic peripheral angiopathy without gangrene: Secondary | ICD-10-CM | POA: Insufficient documentation

## 2022-01-22 DIAGNOSIS — N6011 Diffuse cystic mastopathy of right breast: Secondary | ICD-10-CM | POA: Diagnosis not present

## 2022-01-22 DIAGNOSIS — D0511 Intraductal carcinoma in situ of right breast: Secondary | ICD-10-CM

## 2022-01-22 DIAGNOSIS — C50411 Malignant neoplasm of upper-outer quadrant of right female breast: Secondary | ICD-10-CM | POA: Insufficient documentation

## 2022-01-22 DIAGNOSIS — M1990 Unspecified osteoarthritis, unspecified site: Secondary | ICD-10-CM | POA: Insufficient documentation

## 2022-01-22 DIAGNOSIS — Z87891 Personal history of nicotine dependence: Secondary | ICD-10-CM | POA: Diagnosis not present

## 2022-01-22 DIAGNOSIS — I110 Hypertensive heart disease with heart failure: Secondary | ICD-10-CM | POA: Diagnosis not present

## 2022-01-22 DIAGNOSIS — F418 Other specified anxiety disorders: Secondary | ICD-10-CM | POA: Insufficient documentation

## 2022-01-22 DIAGNOSIS — C50911 Malignant neoplasm of unspecified site of right female breast: Secondary | ICD-10-CM | POA: Diagnosis not present

## 2022-01-22 DIAGNOSIS — I2510 Atherosclerotic heart disease of native coronary artery without angina pectoris: Secondary | ICD-10-CM | POA: Insufficient documentation

## 2022-01-22 DIAGNOSIS — Z170 Estrogen receptor positive status [ER+]: Secondary | ICD-10-CM | POA: Diagnosis not present

## 2022-01-22 DIAGNOSIS — I509 Heart failure, unspecified: Secondary | ICD-10-CM | POA: Insufficient documentation

## 2022-01-22 DIAGNOSIS — K219 Gastro-esophageal reflux disease without esophagitis: Secondary | ICD-10-CM | POA: Insufficient documentation

## 2022-01-22 DIAGNOSIS — J449 Chronic obstructive pulmonary disease, unspecified: Secondary | ICD-10-CM | POA: Insufficient documentation

## 2022-01-22 DIAGNOSIS — R928 Other abnormal and inconclusive findings on diagnostic imaging of breast: Secondary | ICD-10-CM | POA: Diagnosis not present

## 2022-01-22 DIAGNOSIS — Z955 Presence of coronary angioplasty implant and graft: Secondary | ICD-10-CM | POA: Insufficient documentation

## 2022-01-22 DIAGNOSIS — D241 Benign neoplasm of right breast: Secondary | ICD-10-CM | POA: Diagnosis not present

## 2022-01-22 HISTORY — PX: BREAST LUMPECTOMY: SHX2

## 2022-01-22 HISTORY — PX: BREAST LUMPECTOMY WITH RADIOACTIVE SEED LOCALIZATION: SHX6424

## 2022-01-22 HISTORY — DX: Occlusion and stenosis of unspecified carotid artery: I65.29

## 2022-01-22 HISTORY — DX: Type 2 diabetes mellitus without complications: E11.9

## 2022-01-22 HISTORY — DX: Chronic obstructive pulmonary disease, unspecified: J44.9

## 2022-01-22 LAB — GLUCOSE, CAPILLARY
Glucose-Capillary: 128 mg/dL — ABNORMAL HIGH (ref 70–99)
Glucose-Capillary: 107 mg/dL — ABNORMAL HIGH (ref 70–99)

## 2022-01-22 SURGERY — BREAST LUMPECTOMY WITH RADIOACTIVE SEED LOCALIZATION
Anesthesia: General | Site: Breast | Laterality: Right

## 2022-01-22 MED ORDER — DEXAMETHASONE SODIUM PHOSPHATE 10 MG/ML IJ SOLN
INTRAMUSCULAR | Status: AC
  Filled 2022-01-22: qty 1

## 2022-01-22 MED ORDER — ONDANSETRON HCL 4 MG/2ML IJ SOLN: 4.0000 mg | Freq: Once | INTRAMUSCULAR | Status: DC | PRN

## 2022-01-22 MED ORDER — ACETAMINOPHEN 500 MG PO TABS
ORAL_TABLET | ORAL | Status: AC
  Filled 2022-01-22: qty 2

## 2022-01-22 MED ORDER — SODIUM CHLORIDE 0.9 % IV SOLN
INTRAVENOUS | Status: AC
  Filled 2022-01-22: qty 10

## 2022-01-22 MED ORDER — EPHEDRINE 5 MG/ML INJ
INTRAVENOUS | Status: AC
  Filled 2022-01-22: qty 5

## 2022-01-22 MED ORDER — FENTANYL CITRATE (PF) 100 MCG/2ML IJ SOLN
INTRAMUSCULAR | Status: AC
  Filled 2022-01-22: qty 2

## 2022-01-22 MED ORDER — ONDANSETRON HCL 4 MG/2ML IJ SOLN
INTRAMUSCULAR | Status: DC | PRN
  Administered 2022-01-22: 4 mg via INTRAVENOUS

## 2022-01-22 MED ORDER — OXYCODONE HCL 5 MG/5ML PO SOLN: 5.0000 mg | Freq: Once | ORAL | Status: AC | PRN

## 2022-01-22 MED ORDER — LIDOCAINE HCL (CARDIAC) PF 100 MG/5ML IV SOSY
PREFILLED_SYRINGE | INTRAVENOUS | Status: DC | PRN
  Administered 2022-01-22: 50 mg via INTRATRACHEAL

## 2022-01-22 MED ORDER — DEXAMETHASONE SODIUM PHOSPHATE 10 MG/ML IJ SOLN
INTRAMUSCULAR | Status: DC | PRN
  Administered 2022-01-22: 4 mg via INTRAVENOUS

## 2022-01-22 MED ORDER — PHENYLEPHRINE 40 MCG/ML (10ML) SYRINGE FOR IV PUSH (FOR BLOOD PRESSURE SUPPORT)
PREFILLED_SYRINGE | INTRAVENOUS | Status: AC
  Filled 2022-01-22: qty 10

## 2022-01-22 MED ORDER — CHLORHEXIDINE GLUCONATE CLOTH 2 % EX PADS: 6.0000 | MEDICATED_PAD | Freq: Once | CUTANEOUS | Status: DC

## 2022-01-22 MED ORDER — PROPOFOL 10 MG/ML IV BOLUS
INTRAVENOUS | Status: AC
  Filled 2022-01-22: qty 20

## 2022-01-22 MED ORDER — OXYCODONE HCL 5 MG PO TABS
5.0000 mg | ORAL_TABLET | Freq: Once | ORAL | Status: AC | PRN
  Administered 2022-01-22: 5 mg via ORAL

## 2022-01-22 MED ORDER — PROPOFOL 10 MG/ML IV BOLUS
INTRAVENOUS | Status: DC | PRN
  Administered 2022-01-22: 140 mg via INTRAVENOUS

## 2022-01-22 MED ORDER — OXYCODONE HCL 5 MG PO TABS: 5.0000 mg | ORAL_TABLET | Freq: Four times a day (QID) | ORAL | 0 refills | Status: DC | PRN

## 2022-01-22 MED ORDER — 0.9 % SODIUM CHLORIDE (POUR BTL) OPTIME
TOPICAL | Status: DC | PRN
  Administered 2022-01-22: 100 mL

## 2022-01-22 MED ORDER — CLINDAMYCIN PHOSPHATE 900 MG/50ML IV SOLN
INTRAVENOUS | Status: AC
  Filled 2022-01-22: qty 50

## 2022-01-22 MED ORDER — CLINDAMYCIN PHOSPHATE 900 MG/50ML IV SOLN
900.0000 mg | INTRAVENOUS | Status: AC
  Administered 2022-01-22: 900 mg via INTRAVENOUS

## 2022-01-22 MED ORDER — FENTANYL CITRATE (PF) 100 MCG/2ML IJ SOLN
INTRAMUSCULAR | Status: DC | PRN
  Administered 2022-01-22: 50 ug via INTRAVENOUS

## 2022-01-22 MED ORDER — ONDANSETRON HCL 4 MG/2ML IJ SOLN
INTRAMUSCULAR | Status: AC
  Filled 2022-01-22: qty 2

## 2022-01-22 MED ORDER — BUPIVACAINE-EPINEPHRINE (PF) 0.25% -1:200000 IJ SOLN
INTRAMUSCULAR | Status: DC | PRN
Start: 2022-01-22 — End: 2022-01-22
  Administered 2022-01-22: 20 mL

## 2022-01-22 MED ORDER — LACTATED RINGERS IV SOLN: INTRAVENOUS | Status: DC

## 2022-01-22 MED ORDER — FENTANYL CITRATE (PF) 100 MCG/2ML IJ SOLN: 25.0000 ug | INTRAMUSCULAR | Status: DC | PRN

## 2022-01-22 MED ORDER — ACETAMINOPHEN 500 MG PO TABS
1000.0000 mg | ORAL_TABLET | ORAL | Status: AC
  Administered 2022-01-22: 1000 mg via ORAL

## 2022-01-22 MED ORDER — OXYCODONE HCL 5 MG PO TABS
ORAL_TABLET | ORAL | Status: AC
  Filled 2022-01-22: qty 1

## 2022-01-22 MED ORDER — LIDOCAINE 2% (20 MG/ML) 5 ML SYRINGE
INTRAMUSCULAR | Status: AC
  Filled 2022-01-22: qty 5

## 2022-01-22 MED ORDER — EPHEDRINE SULFATE (PRESSORS) 50 MG/ML IJ SOLN
INTRAMUSCULAR | Status: DC | PRN
  Administered 2022-01-22 (×2): 10 mg via INTRAVENOUS

## 2022-01-22 SURGICAL SUPPLY — 42 items
ADH SKN CLS APL DERMABOND .7 (GAUZE/BANDAGES/DRESSINGS) ×1
APL PRP STRL LF DISP 70% ISPRP (MISCELLANEOUS) ×1
BINDER BREAST XLRG (GAUZE/BANDAGES/DRESSINGS) ×1 IMPLANT
BLADE SURG 15 STRL LF DISP TIS (BLADE) ×2 IMPLANT
BLADE SURG 15 STRL SS (BLADE) ×2
CHLORAPREP W/TINT 26 (MISCELLANEOUS) ×3 IMPLANT
COVER BACK TABLE 60X90IN (DRAPES) ×3 IMPLANT
COVER MAYO STAND STRL (DRAPES) ×3 IMPLANT
COVER PROBE W GEL 5X96 (DRAPES) ×3 IMPLANT
DERMABOND ADVANCED (GAUZE/BANDAGES/DRESSINGS) ×1
DERMABOND ADVANCED .7 DNX12 (GAUZE/BANDAGES/DRESSINGS) ×2 IMPLANT
DRAPE LAPAROTOMY 100X72 PEDS (DRAPES) ×3 IMPLANT
DRAPE UTILITY XL STRL (DRAPES) ×3 IMPLANT
ELECT COATED BLADE 2.86 ST (ELECTRODE) ×3 IMPLANT
ELECT REM PT RETURN 9FT ADLT (ELECTROSURGICAL) ×2
ELECTRODE REM PT RTRN 9FT ADLT (ELECTROSURGICAL) ×2 IMPLANT
GLOVE SRG 8 PF TXTR STRL LF DI (GLOVE) ×2 IMPLANT
GLOVE SURG LTX SZ8 (GLOVE) ×3 IMPLANT
GLOVE SURG POLYISO LF SZ6.5 (GLOVE) ×1 IMPLANT
GLOVE SURG POLYISO LF SZ7 (GLOVE) ×1 IMPLANT
GLOVE SURG UNDER POLY LF SZ6.5 (GLOVE) ×1 IMPLANT
GLOVE SURG UNDER POLY LF SZ7 (GLOVE) ×1 IMPLANT
GLOVE SURG UNDER POLY LF SZ8 (GLOVE) ×2
GOWN STRL REUS W/ TWL LRG LVL3 (GOWN DISPOSABLE) ×4 IMPLANT
GOWN STRL REUS W/ TWL XL LVL3 (GOWN DISPOSABLE) ×2 IMPLANT
GOWN STRL REUS W/TWL LRG LVL3 (GOWN DISPOSABLE) ×4
GOWN STRL REUS W/TWL XL LVL3 (GOWN DISPOSABLE) ×2
KIT MARKER MARGIN INK (KITS) ×3 IMPLANT
NDL HYPO 25X1 1.5 SAFETY (NEEDLE) ×2 IMPLANT
NEEDLE HYPO 25X1 1.5 SAFETY (NEEDLE) ×2 IMPLANT
NS IRRIG 1000ML POUR BTL (IV SOLUTION) ×3 IMPLANT
PACK BASIN DAY SURGERY FS (CUSTOM PROCEDURE TRAY) ×3 IMPLANT
PENCIL SMOKE EVACUATOR (MISCELLANEOUS) ×3 IMPLANT
SLEEVE SCD COMPRESS KNEE MED (STOCKING) ×3 IMPLANT
SPONGE T-LAP 4X18 ~~LOC~~+RFID (SPONGE) ×3 IMPLANT
SUT MNCRL AB 4-0 PS2 18 (SUTURE) ×3 IMPLANT
SUT VICRYL 3-0 CR8 SH (SUTURE) ×3 IMPLANT
SYR CONTROL 10ML LL (SYRINGE) ×3 IMPLANT
TOWEL GREEN STERILE FF (TOWEL DISPOSABLE) ×3 IMPLANT
TRAY FAXITRON CT DISP (TRAY / TRAY PROCEDURE) ×3 IMPLANT
TUBE CONNECTING 20X1/4 (TUBING) ×1 IMPLANT
YANKAUER SUCT BULB TIP NO VENT (SUCTIONS) ×1 IMPLANT

## 2022-01-22 NOTE — Anesthesia Postprocedure Evaluation (Signed)
Anesthesia Post Note ? ?Patient: Felicia Perry ? ?Procedure(s) Performed: RIGHT BREAST LUMPECTOMY WITH RADIOACTIVE SEED LOCALIZATION (Right: Breast) ? ?  ? ?Patient location during evaluation: PACU ?Anesthesia Type: General ?Level of consciousness: awake and alert ?Pain management: pain level controlled ?Vital Signs Assessment: post-procedure vital signs reviewed and stable ?Respiratory status: spontaneous breathing, nonlabored ventilation, respiratory function stable and patient connected to nasal cannula oxygen ?Cardiovascular status: blood pressure returned to baseline and stable ?Postop Assessment: no apparent nausea or vomiting ?Anesthetic complications: no ? ? ?No notable events documented. ? ?Last Vitals:  ?Vitals:  ? 01/22/22 1500 01/22/22 1515  ?BP: (!) 142/68 133/65  ?Pulse: 64 68  ?Resp: 19 13  ?Temp:    ?SpO2: 97% 96%  ?  ?Last Pain:  ?Vitals:  ? 01/22/22 1515  ?TempSrc:   ?PainSc: 0-No pain  ? ? ?  ?  ?  ?  ?  ?  ? ?[employer] ? ? ? ? ?

## 2022-01-22 NOTE — Transfer of Care (Signed)
Immediate Anesthesia Transfer of Care Note ? ?Patient: Felicia Perry ? ?Procedure(s) Performed: RIGHT BREAST LUMPECTOMY WITH RADIOACTIVE SEED LOCALIZATION (Right: Breast) ? ?Patient Location: PACU ? ?Anesthesia Type:General ? ?Level of Consciousness: drowsy and patient cooperative ? ?Airway & Oxygen Therapy: Patient Spontanous Breathing and Patient connected to face mask oxygen ? ?Post-op Assessment: Report given to RN and Post -op Vital signs reviewed and stable ? ?Post vital signs: Reviewed and stable ? ?Last Vitals:  ?Vitals Value Taken Time  ?BP    ?Temp    ?Pulse 64 01/22/22 1449  ?Resp    ?SpO2 90 % 01/22/22 1449  ?Vitals shown include unvalidated device data. ? ?Last Pain:  ?Vitals:  ? 01/22/22 1219  ?TempSrc: Oral  ?PainSc: 0-No pain  ?   ? ?Patients Stated Pain Goal: 4 (01/22/22 1219) ? ?Complications: No notable events documented. ?

## 2022-01-22 NOTE — Discharge Instructions (Addendum)
[facility] Office Phone Number [phone]  BREAST BIOPSY/ PARTIAL MASTECTOMY: POST OP INSTRUCTIONS  Always review your discharge instruction sheet given to you by the facility where your surgery was performed.  IF YOU HAVE DISABILITY OR FAMILY LEAVE FORMS, YOU MUST BRING THEM TO THE OFFICE FOR PROCESSING.  DO NOT GIVE THEM TO YOUR DOCTOR.  A prescription for pain medication may be given to you upon discharge.  Take your pain medication as prescribed, if needed.  If narcotic pain medicine is not needed, then you may take acetaminophen (Tylenol) or ibuprofen (Advil) as needed. Take your usually prescribed medications unless otherwise directed If you need a refill on your pain medication, please contact your pharmacy.  They will contact our office to request authorization.  Prescriptions will not be filled after 5pm or on week-ends. You should eat very light the first 24 hours after surgery, such as soup, crackers, pudding, etc.  Resume your normal diet the day after surgery. Most patients will experience some swelling and bruising in the breast.  Ice packs and a good support bra will help.  Swelling and bruising can take several days to resolve.  It is common to experience some constipation if taking pain medication after surgery.  Increasing fluid intake and taking a stool softener will usually help or prevent this problem from occurring.  A mild laxative (Milk of Magnesia or Miralax) should be taken according to package directions if there are no bowel movements after 48 hours. Unless discharge instructions indicate otherwise, you may remove your bandages 24-48 hours after surgery, and you may shower at that time.  You may have steri-strips (small skin tapes) in place directly over the incision.  These strips should be left on the skin for 7-10 days.  If your surgeon used skin glue on the incision, you may shower in 24 hours.  The glue will flake off over the next 2-3 weeks.  Any  sutures or staples will be removed at the office during your follow-up visit. ACTIVITIES:  You may resume regular daily activities (gradually increasing) beginning the next day.  Wearing a good support bra or sports bra minimizes pain and swelling.  You may have sexual intercourse when it is comfortable. You may drive when you no longer are taking prescription pain medication, you can comfortably wear a seatbelt, and you can safely maneuver your car and apply brakes. RETURN TO WORK:  ______________________________________________________________________________________ You should see your doctor in the office for a follow-up appointment approximately two weeks after your surgery.  Your doctor's nurse will typically make your follow-up appointment when she calls you with your pathology report.  Expect your pathology report 2-3 business days after your surgery.  You may call to check if you do not hear from us after three days. OTHER INSTRUCTIONS: _______________________________________________________________________________________________ _____________________________________________________________________________________________________________________________________ _____________________________________________________________________________________________________________________________________ _____________________________________________________________________________________________________________________________________  WHEN TO CALL YOUR DOCTOR: Fever over 101.0 Nausea and/or vomiting. Extreme swelling or bruising. Continued bleeding from incision. Increased pain, redness, or drainage from the incision.  The clinic staff is available to answer your questions during regular business hours.  Please don't hesitate to call and ask to speak to one of the nurses for clinical concerns.  If you have a medical emergency, go to the nearest emergency room or call 911.  A surgeon from [facility] is always on call at the hospital.  For further questions, please visit [email or web address]    Post Anesthesia Home Care Instructions  Activity: Get plenty of rest for the remainder of   the day. A responsible individual must stay with you for 24 hours following the procedure.  For the next 24 hours, DO NOT: -Drive a car -Operate machinery -Drink alcoholic beverages -Take any medication unless instructed by your physician -Make any legal decisions or sign important papers.  Meals: Start with liquid foods such as gelatin or soup. Progress to regular foods as tolerated. Avoid greasy, spicy, heavy foods. If nausea and/or vomiting occur, drink only clear liquids until the nausea and/or vomiting subsides. Call your physician if vomiting continues.  Special Instructions/Symptoms: Your throat may feel dry or sore from the anesthesia or the breathing tube placed in your throat during surgery. If this causes discomfort, gargle with warm salt water. The discomfort should disappear within 24 hours.  If you had a scopolamine patch placed behind your ear for the management of post- operative nausea and/or vomiting:  1. The medication in the patch is effective for 72 hours, after which it should be removed.  Wrap patch in a tissue and discard in the trash. Wash hands thoroughly with soap and water. 2. You may remove the patch earlier than 72 hours if you experience unpleasant side effects which may include dry mouth, dizziness or visual disturbances. 3. Avoid touching the patch. Wash your hands with soap and water after contact with the patch.      

## 2022-01-22 NOTE — Interval H&P Note (Signed)
History and Physical Interval Note: ? ?01/22/2022 ?1:41 PM ? ?Felicia Perry  has presented today for surgery, with the diagnosis of RIGHT BREAST DCIS.  The various methods of treatment have been discussed with the patient and family. After consideration of risks, benefits and other options for treatment, the patient has consented to  Procedure(s): ?RIGHT BREAST LUMPECTOMY WITH RADIOACTIVE SEED LOCALIZATION (Right) as a surgical intervention.  The patient's history has been reviewed, patient examined, no change in status, stable for surgery.  I have reviewed the patient's chart and labs.  Questions were answered to the patient's satisfaction.   ? ? ?Sinthia Karabin A Cydne Grahn ? ? ?

## 2022-01-22 NOTE — Anesthesia Procedure Notes (Signed)
Procedure Name: LMA Insertion ?Date/Time: 01/22/2022 2:01 PM ?Performed by: Glory Buff, CRNA ?Pre-anesthesia Checklist: Patient identified, Emergency Drugs available, Suction available and Patient being monitored ?Patient Re-evaluated:Patient Re-evaluated prior to induction ?Oxygen Delivery Method: Circle system utilized ?Preoxygenation: Pre-oxygenation with 100% oxygen ?Induction Type: IV induction ?LMA: LMA inserted ?LMA Size: 4.0 ?Number of attempts: 1 ?Placement Confirmation: positive ETCO2 ?Tube secured with: Tape ?Dental Injury: Teeth and Oropharynx as per pre-operative assessment  ? ? ? ? ?

## 2022-01-22 NOTE — Anesthesia Preprocedure Evaluation (Addendum)
Anesthesia Evaluation  ?Patient identified by MRN, date of birth, ID band ?Patient awake ? ? ? ?Reviewed: ?Allergy & Precautions, NPO status , Patient's Chart, lab work & pertinent test results, reviewed documented beta blocker date and time  ? ?History of Anesthesia Complications ?Negative for: history of anesthetic complications ? ?Airway ?Mallampati: I ? ?TM Distance: >3 FB ?Neck ROM: Full ? ? ? Dental ? ?(+) Dental Advisory Given ?  ?Pulmonary ?asthma , sleep apnea (resolved) , COPD, former smoker,  ?  ?Pulmonary exam normal ? ? ? ? ? ? ? Cardiovascular ?hypertension, Pt. on home beta blockers and Pt. on medications ?+ CAD, + Cardiac Stents, + Peripheral Vascular Disease and +CHF  ?Normal cardiovascular exam ? ? ?'22 Carotid US - 40-59% left ICAS, 1-39% right ICAS ? ?'21 Myoperfusion - The left ventricular ejection fraction is hyperdynamic (>65%). ?Nuclear stress EF: 71%. ?There was no ST segment deviation noted during stress. ?Defect 1: There is a small defect of mild severity present in the apex location. ?The study is normal. ? ?? This is a low risk study. ? ?  ?Neuro/Psych ? Headaches, PSYCHIATRIC DISORDERS Anxiety Depression   ? GI/Hepatic ?Neg liver ROS, PUD, GERD  Medicated and Controlled,  ?Endo/Other  ?diabetes ?Obesity ? ? ? Renal/GU ?negative Renal ROS  ? ?  ?Musculoskeletal ? ?(+) Arthritis , Osteoarthritis,   ? Abdominal ?  ?Peds ? Hematology ?negative hematology ROS ?(+)   ?Anesthesia Other Findings ?Chronic pain ? ? Reproductive/Obstetrics ? ?  ? ? ? ? ? ? ? ? ? ? ? ? ? ?  ?  ? ? ? ? ? ? ? ?Anesthesia Physical ?Anesthesia Plan ? ?ASA: 3 ? ?Anesthesia Plan: General  ? ?Post-op Pain Management: Tylenol PO (pre-op)*  ? ?Induction: Intravenous ? ?PONV Risk Score and Plan: 3 and Treatment may vary due to age or medical condition, Ondansetron and Dexamethasone ? ?Airway Management Planned: LMA ? ?Additional Equipment: None ? ?Intra-op Plan:  ? ?Post-operative Plan:  Extubation in OR ? ?Informed Consent: I have reviewed the patients History and Physical, chart, labs and discussed the procedure including the risks, benefits and alternatives for the proposed anesthesia with the patient or authorized representative who has indicated his/her understanding and acceptance.  ? ? ? ?Dental advisory given ? ?Plan Discussed with: CRNA and Anesthesiologist ? ?Anesthesia Plan Comments:   ? ? ? ? ? ?Anesthesia Quick Evaluation ? ?

## 2022-01-22 NOTE — Op Note (Signed)
Preoperative diagnosis: Stage I right breast cancer upper outer quadrant ? ?Postoperative diagnosis: Same ? ?Procedure: Right breast seed localized lumpectomy ? ?Surgeon: Erroll Luna, MD ? ?Anesthesia: LMA with 0.25% Marcaine plain ? ?EBL: Minimal ? ?Specimen: Right breast tissue with seed and clip verified by Faxitron ? ?Drains: None ? ?Indications for procedure: The patient is a 76 year old female with a screen detected mammographic abnormality right breast upper outer quadrant.  Core biopsy showed invasive ductal carcinoma.  This was about 1 cm in size.  We discussed lumpectomy alone versus lumpectomy with some lymph node mapping.  The pros and cons of lymph node mapping were discussed as well as potential risk of disease in the axilla which would be quite small.  She opted for lumpectomy alone.  She was also seen with medical and radiation oncology.The procedure has been discussed with the patient. Alternatives to surgery have been discussed with the patient.  Risks of surgery include bleeding,  Infection,  Seroma formation, death,  and the need for further surgery.   The patient understands and wishes to proceed.  ? ?Description of procedure: The patient was met in the holding area and questions were answered.  Right breast was marked as correct site.  Seed was placed as an outpatient.  Films were available for review.  She was then taken back to the operating room.  She was placed upon upon the OR table.  After induction of general esthesia, right breast was prepped and draped in sterile fashion and timeout performed.  Proper patient, site and procedure were verified.  Neoprobe used to identify the seed right breast upper outer quadrant.  Local anesthetic was infiltrated the skin and a curvilinear incision was made.  Dissection was carried down all tissue around the seed and clip were excised with a grossly negative margin.  Hemostasis achieved with cautery.  The Faxitron image revealed the seed and clip to  be present.  Margins appeared negative.  Cavities irrigated.  Local anesthetic was infiltrated and clips were placed.  It was then closed with 3-0 Vicryl and 4-0 Monocryl.  Dermabond applied.  Breast binder placed.  All counts found to be correct.  The patient was then awoke extubated taken to recovery in satisfactory condition. ?

## 2022-01-22 NOTE — H&P (Signed)
History of Present Illness: ?Felicia Perry is a 76 y.o. female who is seen today as an office consultation for evaluation of Breast Cancer ?.  ? ?She presents to the [facility] for evaluation of right breast mammographic abnormality. In the right breast upper outer quadrant was a focus measuring 9 mm noted on the mammogram. Core biopsy showed intermediate grade DCIS. It was ER/PR positive. No history of breast pain, nipple discharge or breast mass bilaterally. No family history of breast cancer reported. She has been faithful getting her mammograms. ? ?Review of Systems: ?A complete review of systems was obtained from the patient. I have reviewed this information and discussed as appropriate with the patient. See HPI as well for other ROS. ? ? ? ?Medical History: ?Past Medical History:  ?Diagnosis Date  ? Arthritis  ? Asthma, unspecified asthma severity, unspecified whether complicated, unspecified whether persistent  ? COPD (chronic obstructive pulmonary disease) (CMS-HCC)  ? GERD (gastroesophageal reflux disease)  ? Glaucoma (increased eye pressure)  ? Hypertension  ? ?Patient Active Problem List  ?Diagnosis  ? Ductal carcinoma in situ (DCIS) of right breast  ? Essential hypertension  ? GERD (gastroesophageal reflux disease)  ? 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 ?Past Surgical History:  ?Procedure Laterality Date  ? Cardiac Catheterization N/A  ?08/14/2016  ? ? ?Allergies  ?Allergen Reactions  ? Penicillins Anaphylaxis  ?REACTION: per patient causes rash,hives ? ? Cephalosporins Swelling  ?REACTION: tongue swelling  ? Citalopram Hydrobromide Other (See Comments)  ?psychosis  ? Doxycycline Hyclate Nausea  ?REACTION: nausea, vomiting  ? Rosuvastatin Other (See Comments)  ?Pt reports causes bilateral lower extremity muscle aches.  ? Zolpidem Tartrate Other (See Comments)  ?REACTION: difficulty with concentration  ? Clarithromycin Rash  ?REACTION: blisters in mouth ? ? Levofloxacin Rash  ?REACTION:  tongue swells ? ? ?Current Outpatient Medications on File Prior to Visit  ?Medication Sig Dispense Refill  ? albuterol 90 mcg/actuation inhaler Inhale into the lungs  ? ALPRAZolam (XANAX) 1 MG tablet TAKE ONE TABLET AT BEDTIME AS NEEDED FORSLEEP -DO NOT DRIVE FOR 8 HOURS AFTER TAKING  ? amLODIPine (NORVASC) 10 MG tablet Take 1 tablet by mouth once daily  ? aspirin 81 MG EC tablet Take by mouth  ? bempedoic acid-ezetimibe (NEXLIZET) 180-10 mg Tab Take by mouth  ? brimonidine (ALPHAGAN) 0.2 % ophthalmic solution Apply 1 drop to eye 2 (two) times daily  ? budesonide-formoteroL (SYMBICORT) 160-4.5 mcg/actuation inhaler Inhale into the lungs  ? carvediloL (COREG) 6.25 MG tablet Take 6.25 mg by mouth 2 (two) times daily  ? famotidine (PEPCID) 20 MG tablet Take 1 tablet by mouth 2 (two) times daily  ? irbesartan (AVAPRO) 300 MG tablet Take 1 tablet by mouth once daily  ? levocetirizine (XYZAL) 5 MG tablet Take 1 tablet by mouth every morning  ? omega-3 acid ethyl esters (LOVAZA) 1 gram capsule TAKE TWO CAPSULES EVERY DAY  ? oxyCODONE-acetaminophen (PERCOCET) 5-325 mg tablet Take by mouth  ? prednisoLONE acetate (PRED FORTE) 1 % ophthalmic suspension Apply 1 drop to eye 4 (four) times daily  ? venlafaxine (EFFEXOR-XR) 75 MG XR capsule TAKE ONE CAPSULE EACH DAY WITH BREAKFAST  ? acetaminophen (TYLENOL) 325 MG tablet Take 650 mg by mouth every 6 (six) hours as needed  ? NEXLIZET 180-10 mg Tab TAKE ONE TABLET EACH DAY  ? ?No current facility-administered medications on file prior to visit.  ? ?Family History  ?Problem Relation Age of Onset  ? Obesity Father  ?  High blood pressure (Hypertension) Father  ? Hyperlipidemia (Elevated cholesterol) Father  ? Diabetes Father  ? ? ?Social History  ? ?Tobacco Use  ?Smoking Status Former  ? Types: Cigarettes  ? Quit date: 1989  ? Years since quitting: 34.2  ?Smokeless Tobacco Never  ? ? ?Social History  ? ?Socioeconomic History  ? Marital status: Unknown  ?Tobacco Use  ? Smoking status:  Former  ?Types: Cigarettes  ?Quit date: 1989  ?Years since quitting: 34.2  ? Smokeless tobacco: Never  ?Vaping Use  ? Vaping Use: Never used  ?Substance and Sexual Activity  ? Alcohol use: Not Currently  ? Drug use: Never  ? ?Objective:  ?There were no vitals filed for this visit.  ?There is no height or weight on file to calculate BMI. ? ?Physical Exam ?Constitutional:  ?Appearance: Normal appearance.  ?HENT:  ?Head: Normocephalic.  ?Eyes:  ?General: No scleral icterus. ?Cardiovascular:  ?Rate and Rhythm: Normal rate.  ?Pulmonary:  ?Effort: Pulmonary effort is normal.  ?Breath sounds: No stridor.  ?Chest:  ?Breasts: ?Right: Normal.  ?Left: Normal.  ?Musculoskeletal:  ?General: Normal range of motion.  ?Cervical back: Normal range of motion.  ?Lymphadenopathy:  ?Upper Body:  ?Right upper body: No supraclavicular or axillary adenopathy.  ?Left upper body: No supraclavicular or axillary adenopathy.  ?Skin: ?General: Skin is warm.  ?Neurological:  ?General: No focal deficit present.  ?Mental Status: She is alert and oriented to person, place, and time.  ?Psychiatric:  ?Mood and Affect: Mood normal.  ?Behavior: Behavior normal.  ? ? ? ?Labs, Imaging and Diagnostic Testing: ?CLINICAL DATA: Patient recalled from screening for right breast ?mass. ?  ?EXAM: ?DIGITAL DIAGNOSTIC UNILATERAL RIGHT MAMMOGRAM WITH TOMOSYNTHESIS AND ?CAD; ULTRASOUND RIGHT BREAST LIMITED ?  ?TECHNIQUE: ?Right digital diagnostic mammography and breast tomosynthesis was ?performed. The images were evaluated with computer-aided detection.; ?Targeted ultrasound examination of the right breast was performed ?  ?COMPARISON: Previous exam(s). ?  ?ACR Breast Density Category b: There are scattered areas of ?fibroglandular density. ?  ?FINDINGS: ?Within the upper-outer right breast posterior depth there is a ?persistent oval circumscribed mass, further evaluated with ?additional imaging. ?  ?Targeted ultrasound is performed, showing a 7 x 5 x 6 mm  bilobed ?hypoechoic mass right breast 9:30 o'clock 8 cm from the nipple. No ?right axillary adenopathy. ?  ?IMPRESSION: ?Indeterminate right breast mass 9:30 o'clock. ?  ?RECOMMENDATION: ?Ultrasound-guided core needle biopsy right breast mass 9:30 o'clock. ?  ?I have discussed the findings and recommendations with the patient. ?If applicable, a reminder letter will be sent to the patient ?regarding the next appointment. ?  ?BI-RADS CATEGORY 4: Suspicious. ?  ?  ?Electronically Signed ?By: Lovey Newcomer M.D. ?On: 12/25/2021 14:35 ? ?ADDITIONAL INFORMATION: ?PROGNOSTIC INDICATORS ?Results: ?IMMUNOHISTOCHEMICAL AND MORPHOMETRIC ANALYSIS PERFORMED MANUALLY ?Estrogen Receptor: 100%, POSITIVE, STRONG STAINING INTENSITY ?Progesterone Receptor: 100%, POSITIVE, STRONG STAINING INTENSITY ?REFERENCE RANGE ESTROGEN RECEPTOR ?NEGATIVE 0% ?POSITIVE =>1% ?REFERENCE RANGE PROGESTERONE RECEPTOR ?NEGATIVE 0% ?POSITIVE =>1% ?All controls stained appropriately ?Thressa Sheller MD ?Pathologist, Electronic Signature ?( Signed 01/02/2022) ?FINAL DIAGNOSIS ?Diagnosis ?Breast, right, needle core biopsy, 9:30 o'clock, 8cmfn ?- DUCTAL CARCINOMA IN SITU ?- SEE COMMENT ?Microscopic Comment ?Based on the biopsy, the ductal carcinoma in situ has a papillary architecture, intermediate nuclear grade and measures ?1 of 2 ?FINAL for TRAMYA, SCHOENFELDER ([id number]) ?Microscopic Comment(continued) ?0.4 cm in greatest linear extent. Prognostic markers (ER/PR) are pending and will be reported in an addendum. Dr. Marland KitchenPicklesimer reviewed the case and agrees with the above diagnosis. These results  were called to [facility] on December 31, 2021. ?Thressa Sheller MD ?Pathologist, Electronic Signature ?(Case signed 12/31/2021) ? ?Assessment and Plan:  ? ?Diagnoses and all orders for this visit: ? ?Ductal carcinoma in situ (DCIS) of right breast ? ? ? ?Long discussion about surgical options given her disease process. Natural history of disease process  discussed with patient. Surgical options discussed which include mastectomy with reconstruction or lumpectomy. Pros and cons of each, long-term expectations, long-term survival and recurrence rates were discussed with t

## 2022-01-23 ENCOUNTER — Other Ambulatory Visit: Payer: Self-pay | Admitting: Family Medicine

## 2022-01-23 ENCOUNTER — Encounter (HOSPITAL_BASED_OUTPATIENT_CLINIC_OR_DEPARTMENT_OTHER): Payer: Self-pay | Admitting: Surgery

## 2022-01-23 LAB — COLOGUARD: COLOGUARD: NEGATIVE

## 2022-01-26 NOTE — Progress Notes (Signed)
? ?Chronic Care Management ?Pharmacy Note ? ?01/27/2022 ?Name:  Felicia Perry MRN:  [id number] DOB:  04/20/1946 ? ?Summary: ?PharmD FU.  Patient doing well and congratulated on improved A1c.  She has made great lifestyle changes.  She continues to take 117m irbesartan since bump in creatinine which then normalized.  BP at home all < 130/80s per her report.  She did question on if she should go back to the 3078mtabs. ? ?Recommendations/Changes made from today's visit: ?Continue routine A1c follow up  ?Continue positive lifestyle mods! ?Irbesartan 15024m monitor BP at home and can increase if needed based on BP control ? ?Plan: ?FU 6 months to assess DM  ? ? ?Subjective: ?Felicia Perry an [age]o. year old female who is a primary patient of Hunter, SteBrayton MarsD.  The CCM team was consulted for assistance with disease management and care coordination needs.   ? ?Engaged with patient face to face for follow up visit in response to provider referral for pharmacy case management and/or care coordination services.  ? ?Consent to Services:  ?The patient was given the following information about Chronic Care Management services today, agreed to services, and gave verbal consent: 1. CCM service includes personalized support from designated clinical staff supervised by the primary care provider, including individualized plan of care and coordination with other care providers 2. 24/7 contact phone numbers for assistance for urgent and routine care needs. 3. Service will only be billed when office clinical staff spend 20 minutes or more in a month to coordinate care. 4. Only one practitioner may furnish and bill the service in a calendar month. 5.The patient may stop CCM services at any time (effective at the end of the month) by phone call to the office staff. 6. The patient will be responsible for cost sharing (co-pay) of up to 20% of the service fee (after annual deductible is met). Patient agreed to services and  consent obtained. ? ?Patient Care Team: ?HunMarin OlpD as PCP - General (Family Medicine) ?PemFreada BergeronD as PCP - Cardiology (Cardiology) ?SmiLyndal PulleyO as Consulting Physician (Sports Medicine) ?SheStarlyn Skeans PhyLibrarian, academicermatology) ?[facility] Pharmacist (Pharmacist) ?StuMauro KaufmannN as Oncology Nurse Navigator ?MarRockwell GermanyN as Oncology Nurse Navigator ?CorErroll LunaD as Consulting Physician (General Surgery) ?IruBenay PikeD as Consulting Physician (Hematology and Oncology) ?KinGery PrayD as Consulting Physician (Radiation Oncology) ? ?Recent office visits:  ?08/12/2021 OV (PCP) HunMarin OlpD; no medication changes indicated. ?  ?Recent consult visits:  ?07/01/2021 OV (cardiology) WeaRichardson Dopp PA-C; decrease Aspirin one tablet 81 mg daily. ?  ?03/18/2021 OV (cardiology( PJohney FrameeaGreer EeD; -Change metop to coreg 6.60m92mD for better blood pressure control. -Declined starting farxiga and spironolactone today; will consider in the future ? ?Objective: ? ?Lab Results  ?Component Value Date  ? CREATININE 0.90 01/07/2022  ? BUN 39 (H) 01/07/2022  ? GFR 60.91 12/22/2021  ? GFRNONAA >60 01/07/2022  ? GFRAA 74 08/07/2020  ? NA 140 01/07/2022  ? K 4.1 01/07/2022  ? CALCIUM 9.4 01/07/2022  ? CO2 32 01/07/2022  ? GLUCOSE 203 (H) 01/07/2022  ? ? ?Lab Results  ?Component Value Date/Time  ? HGBA1C 7.0 (H) 12/09/2021 09:54 AM  ? HGBA1C 7.5 (H) 08/12/2021 11:46 AM  ? GFR 60.91 12/22/2021 08:02 AM  ? GFR 28.16 (L) 12/09/2021 09:54 AM  ?  ?Last diabetic Eye exam:  ?  Lab Results  ?Component Value Date/Time  ? HMDIABEYEEXA No Retinopathy 12/25/2021 12:00 AM  ?  ?Last diabetic Foot exam: No results found for: HMDIABFOOTEX  ? ?Lab Results  ?Component Value Date  ? CHOL 116 12/09/2021  ? HDL 32.90 (L) 12/09/2021  ? [place] 48 08/07/2020  ? LDLDIRECT 53.0 12/09/2021  ? TRIG 217.0 (H) 12/09/2021  ? CHOLHDL 4 12/09/2021  ? ? ? ?   Latest Ref Rng & Units 01/07/2022  ?  9:02 AM 12/09/2021  ?  9:54 AM 08/12/2021  ? 11:46 AM  ?Hepatic Function  ?Total Protein 6.5 - 8.1 g/dL 7.5   7.7   7.4    ?Albumin 3.5 - 5.0 g/dL 4.3   4.5   4.2    ?AST 15 - 41 U/L '13   25   30    ' ?ALT 0 - 44 U/L 15   25   41    ?Alk Phosphatase 38 - 126 U/L 76   60   119    ?Total Bilirubin 0.3 - 1.2 mg/dL 0.9   0.6   0.4    ? ? ?Lab Results  ?Component Value Date/Time  ? TSH 1.38 08/07/2020 08:45 AM  ? TSH 1.37 10/20/2016 09:34 AM  ? ? ? ?  Latest Ref Rng & Units 01/07/2022  ?  9:02 AM 08/12/2021  ? 11:46 AM 01/02/2021  ? 12:39 PM  ?CBC  ?WBC 4.0 - 10.5 K/uL 14.5   8.6     ?Hemoglobin 12.0 - 15.0 g/dL 14.0   13.7   14.3    ?Hematocrit 36.0 - 46.0 % 40.7   40.6   42.0    ?Platelets 150 - 400 K/uL 359   298.0     ? ? ?Lab Results  ?Component Value Date/Time  ? VD25OH 45.47 08/12/2021 11:46 AM  ? VD25OH 61 08/07/2020 08:45 AM  ? VD25OH 62.76 07/25/2019 09:17 AM  ? ? ?Clinical ASCVD: Yes  ?The ASCVD Risk score (Arnett DK, et al., 2019) failed to calculate for the following reasons: ?  The valid total cholesterol range is 130 to 320 mg/dL   ? ? ?  09/29/2021  ?  8:56 AM 08/12/2021  ? 10:44 AM 02/05/2021  ?  7:58 AM  ?Depression screen PHQ 2/9  ?Decreased Interest 0 0 0  ?Down, Depressed, Hopeless 0 0 0  ?PHQ - 2 Score 0 0 0  ?Altered sleeping   3  ?Tired, decreased energy   0  ?Change in appetite   3  ?Feeling bad or failure about yourself    0  ?Trouble concentrating   0  ?Moving slowly or fidgety/restless   0  ?Suicidal thoughts   0  ?PHQ-9 Score   6  ?Difficult doing work/chores   Not difficult at all  ? ? ?Social History  ? ?Tobacco Use  ?Smoking Status Former  ? Packs/day: 1.00  ? Years: 26.00  ? Pack years: 26.00  ? Types: Cigarettes  ? Quit date: 1989  ? Years since quitting: 34.2  ?Smokeless Tobacco Never  ? ?BP Readings from Last 3 Encounters:  ?01/22/22 (!) 151/67  ?01/07/22 (!) 154/73  ?01/05/22 (!) 162/100  ? ?Pulse Readings from Last 3 Encounters:  ?01/22/22 73  ?01/07/22  82  ?01/05/22 75  ? ?Wt Readings from Last 3 Encounters:  ?01/22/22 161 lb 9.6 oz (73.3 kg)  ?01/07/22 168 lb 4 oz (76.3 kg)  ?01/02/22 170 lb (77.1 kg)  ? ?BMI Readings from Last 3  Encounters:  ?01/22/22 31.56 kg/m?  ?01/07/22 32.86 kg/m?  ?01/05/22 33.20 kg/m?  ? ? ?Assessment/Interventions: Review of patient past medical history, allergies, medications, health status, including review of consultants reports, laboratory and other test data, was performed as part of comprehensive evaluation and provision of chronic care management services.  ? ?SDOH:  (Social Determinants of Health) assessments and interventions performed: Yes ? ?Financial Resource Strain: Low Risk   ? Difficulty of Paying Living Expenses: Not very hard  ? ? ?SDOH Screenings  ? ?Alcohol Screen: Not on file  ?Depression (PHQ2-9): Low Risk   ? PHQ-2 Score: 0  ?Financial Resource Strain: Low Risk   ? Difficulty of Paying Living Expenses: Not very hard  ?Food Insecurity: No Food Insecurity  ? Worried About Charity fundraiser in the Last Year: Never true  ? Ran Out of Food in the Last Year: Never true  ?Housing: Low Risk   ? Last Housing Risk Score: 0  ?Physical Activity: Sufficiently Active  ? Days of Exercise per Week: 2 days  ? Minutes of Exercise per Session: 120 min  ?Social Connections: Moderately Isolated  ? Frequency of Communication with Friends and Family: More than three times a week  ? Frequency of Social Gatherings with Friends and Family: More than three times a week  ? Attends Religious Services: More than 4 times per year  ? Active Member of Clubs or Organizations: No  ? Attends Archivist Meetings: Never  ? Marital Status: Divorced  ?Stress: Stress Concern Present  ? Feeling of Stress : To some extent  ?Tobacco Use: Medium Risk  ? Smoking Tobacco Use: Former  ? Smokeless Tobacco Use: Never  ? Passive Exposure: Not on file  ?Transportation Needs: No Transportation Needs  ? Lack of Transportation (Medical): No  ? Lack of  Transportation (Non-Medical): No  ? ? ?CCM Care Plan ? ?Allergies  ?Allergen Reactions  ? Celexa [Citalopram Hydrobromide]   ?  psychosis  ? Cephalosporins   ?  REACTION: tongue swelling  ? Clarithromycin   ?  RE

## 2022-01-27 ENCOUNTER — Ambulatory Visit (INDEPENDENT_AMBULATORY_CARE_PROVIDER_SITE_OTHER): Payer: Medicare Other | Admitting: Pharmacist

## 2022-01-27 ENCOUNTER — Encounter: Payer: Self-pay | Admitting: Surgery

## 2022-01-27 DIAGNOSIS — E118 Type 2 diabetes mellitus with unspecified complications: Secondary | ICD-10-CM

## 2022-01-27 DIAGNOSIS — I10 Essential (primary) hypertension: Secondary | ICD-10-CM

## 2022-01-27 NOTE — Patient Instructions (Addendum)
Visit Information ? ? Goals Addressed   ? ?  ?  ?  ?  ? This Visit's Progress  ?  Set My Target A1C-Diabetes Type 2 < 7   On track  ?  Timeframe:  Long-Range Goal ?Priority:  High ?Start Date:  09/16/21                           ?Expected End Date: 03/16/22                     ? ?Follow Up Date 12/17/21  ?  ?- set target A1C < 7  ?  ?Why is this important?   ?Your target A1C is decided together by you and your doctor.  ?It is based on several things like your age and other health issues.  ?  ?Notes:  ?  ? ?  ? ?Patient Care Plan: General Pharmacy (Adult)  ?  ? ?Problem Identified: HTN, CHF, CAD, Type II DM, Depression, Insomnia, HLD   ?Priority: High  ?Onset Date: 09/15/2021  ?  ? ?Long-Range Goal: Patient-Specific Goal   ?Start Date: 09/15/2021  ?Expected End Date: 03/16/2022  ?Recent Progress: On track  ?Priority: High  ?Note:   ?Current Barriers:  ?Fluctuating glucose ? ?Pharmacist Clinical Goal(s):  ?Patient will achieve improvement in A1c as evidenced by screening through collaboration with PharmD and provider.  ? ?Interventions: ?1:1 collaboration with Marin Olp, MD regarding development and update of comprehensive plan of care as evidenced by provider attestation and co-signature ?Inter-disciplinary care team collaboration (see longitudinal plan of care) ?Comprehensive medication review performed; medication list updated in electronic medical record ? ?Hypertension (BP goal <130/80) ?-Controlled ?-Current treatment: ?Irbesartan '150mg'$  daily Appropriate, Effective, Safe, Accessible ?Amlodipine '10mg'$  daily Appropriate, Effective, Safe, Accessible ?Carvedilol 6.'25mg'$  BID Appropriate, Effective, Safe, Accessible ?-Medications previously tried: metoprolol, nadolol, hydralazine ?-Current home readings: normal at home ?-Current dietary habits: depends on the day, some fast food, some home cooked, loves fruits and raw veggies ?-Current exercise habits: minimal lately - previously loved to ride bicycle.  Has  recently started going to [employer] ?-Denies hypotensive/hypertensive symptoms ?-Educated on BP goals and benefits of medications for prevention of heart attack, stroke and kidney damage; ?Exercise goal of 150 minutes per week; ?Importance of home blood pressure monitoring; ?Symptoms of hypotension and importance of maintaining adequate hydration; ?-Counseled to monitor BP at home weekly, document, and provide log at future appointments ?-Recommended to continue current medication ? ?Update 01/27/22 ?Recently had irbesartan dose cut in half due to bump in Creatinine.  Once she returned for CMP her kidney function has normalized.  She has continued on '150mg'$  daily and her BP at home has been all < 130/80. ?She denies any dizziness or HA.  She was concerned on if she needed to increase back to the '300mg'$ . ?Will consult with PCP - if BP is where it is at home I would recommend staying at the lower dose for now.  Lifestyle mods and weight loss will continue to improve BP as well. ? ?Hyperlipidemia: (LDL goal < 70) ?-Not ideally controlled ?-Current treatment: ?Nexlizet 180-'10mg'$  daily ?Lovazq 1g two capsules daily ?-Medications previously tried: statins (myalgais)  ?-Current dietary patterns: see DM ?-Current exercise habits: see HTN ?-Educated on Cholesterol goals;  ?Importance of limiting foods high in cholesterol; ?Exercise goal of 150 minutes per week; ?-Recommended to continue current medication ?LDL close to goal < 70. ?Increased exercise and diet changes should  improve.  ?Getting through [employer], was recently just approved.  Will be getting through this for the next year. ? ?Diabetes (A1c goal <7%) ?-Uncontrolled ?-Current medications: ?None at this time ?-Medications previously tried: none noted  ?-Current home glucose readings ?fasting glucose: not checking ?post prandial glucose: not checking ?-Denies hypoglycemic/hyperglycemic symptoms ?-Current meal patterns: she was eating meals high in  carbohydrates.  Some days she cooks and some days she will get take out depending on how busy she is.  Currently living at her home with son and his fiance.  They will pick up fast food for her occasionally. ?drinks: water ?-Current exercise: minimal - [employer] a few times weekly ?-Educated on A1c and blood sugar goals; ?Exercise goal of 150 minutes per week; ?Benefits of weight loss; ?Carbohydrate counting and/or plate method ?-Counseled to check feet daily and get yearly eye exams ?-Continue current management, lots of time spent today discussing diet (implications of carbs in blood sugar).  She really wants to fix this with diet and exercise.  Encouraged her this was possible with lifestyle mods.  Will follow closely to keep her on track. ?If meds needed in future would recommend Farxiga due to heart benefits. ? ?Update 01/27/22 ?Most recent A1c decreased to 7.0!  ?She has made great lifestyle changes - has not had soda in > 6 months.  She is eating less carbs and sweets, doing more cooking at home instead of eating out and fast food. ?She reports about 9 lbs of weight loss. ?She plans to continue these lifestyle mods - she feels better and is starting to enjoy eating this type of food. ?Continue current management and routine screenings. ?Will continue to follow and keep on track. ? ?Heart Failure (Goal: manage symptoms and prevent exacerbations) ?-Controlled ?-Last ejection fraction: >65% ?-HF type: Diastolic ?-Current treatment: ?Carvedilol 6.'25mg'$  BID ?-Medications previously tried: metoprolol  ?-Current home BP/HR readings: normal ?-Current dietary habits: see DM ?-Current exercise habits: see DM ?-Educated on Benefits of medications for managing symptoms and prolonging life ?Importance of weighing daily; if you gain more than 3 pounds in one day or 5 pounds in one week, contact providers ?Importance of blood pressure control ?-Counseled on diet and exercise extensively ?Recommended to continue current  medication ?Denies any swelling ? ?Depression/Anxiety (Goal: Minimize symptoms) ?-Not ideally controlled ?-Current treatment: ?Venlafaxine ER '75mg'$  daily ?-Medications previously tried/failed: Wellbutrin, Celexa ?-PHQ9:  ?PHQ9 SCORE ONLY 08/12/2021 02/05/2021 08/16/2020  ?PHQ-9 Total Score 0 6 0  ?-GAD7: No flowsheet data found. ? ?-Educated on Benefits of medication for symptom control ?Need to be in right mental frame of mind in order to take care of herself and make lifestyle mods.  Patient admits today that she may be a little depressed.  She does not feel like Effexor is working for her anymore and does not want to increase dose.  She reports stopping this med once and had horrible symptoms but she did not have anything else to replace it.  Based on her history the only ones she has not tried would be Lexapro, Zoloft, and Cymbalta. 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 ?-Will consult with PCP on next steps to treat depression, I feel if we can get this under control se may be more likely to make lifestyle mods to improve DM. ? ? ?Patient Goals/Self-Care Activities ?Patient will:  ?- take medications as prescribed as evidenced by patient report and record review ?focus on medication adherence by own home system ?target a minimum of 150 minutes  of moderate intensity exercise weekly ?Work to limit carbohydrates in diet ? ?Follow Up Plan: The care management team will reach out to the patient again over the next 90 days.  ? ?  ? ?  ?  ? ?The patient verbalized understanding of instructions, educational materials, and care plan provided today and declined offer to receive copy of patient instructions, educational materials, and care plan.  ?Telephone follow up appointment with pharmacy team member scheduled for: 6 months ? ?Edythe Clarity, [facility]  ?Beverly Milch, PharmD ?Clinical Pharmacist  ?[place] [facility] ?([phone] ? ?

## 2022-01-28 ENCOUNTER — Encounter (HOSPITAL_COMMUNITY): Payer: Self-pay

## 2022-01-28 ENCOUNTER — Encounter: Payer: Self-pay | Admitting: *Deleted

## 2022-01-28 LAB — SURGICAL PATHOLOGY

## 2022-01-29 ENCOUNTER — Ambulatory Visit: Payer: Self-pay

## 2022-01-29 ENCOUNTER — Ambulatory Visit (INDEPENDENT_AMBULATORY_CARE_PROVIDER_SITE_OTHER): Payer: Medicare Other | Admitting: Family Medicine

## 2022-01-29 DIAGNOSIS — G8929 Other chronic pain: Secondary | ICD-10-CM | POA: Diagnosis not present

## 2022-01-29 DIAGNOSIS — M25562 Pain in left knee: Secondary | ICD-10-CM | POA: Diagnosis not present

## 2022-01-29 DIAGNOSIS — M170 Bilateral primary osteoarthritis of knee: Secondary | ICD-10-CM | POA: Diagnosis not present

## 2022-01-29 NOTE — Progress Notes (Signed)
Felicia Perry presents to clinic today for Orthovisc injection left knee 1/3 ? ?Procedure: Real-time Ultrasound Guided Injection of left knee superior lateral patellar space ?Device: Philips Affiniti 50G ?Images permanently stored and available for review in PACS ?Verbal informed consent obtained.  Discussed risks and benefits of procedure. Warned about infection, bleeding, damage to structures among others. ?Patient expresses understanding and agreement ?Time-out conducted.   ?Noted no overlying erythema, induration, or other signs of local infection.   ?Skin prepped in a sterile fashion.   ?Local anesthesia: Topical Ethyl chloride.   ?With sterile technique and under real time ultrasound guidance: Orthovisc 30 mg injected into the joint. Fluid seen entering the joint capsule.   ?Completed without difficulty   ?Advised to call if fevers/chills, erythema, induration, drainage, or persistent bleeding.   ?Images permanently stored and available for review in the ultrasound unit.  ?Impression: Technically successful ultrasound guided injection. ? ? ?Lot number: [id number] ? ?Return in 1 week for Orthovisc injection 2/3 ?

## 2022-01-29 NOTE — Patient Instructions (Signed)
Thank you for coming in today.  ? ?You received the 1st gel injection in your left knee today. Seek immediate medical attention if the joint becomes red, extremely painful, or is oozing fluid.  ? ?Schedule the 2nd injection for next week and the 3rd for the week after that. ?

## 2022-01-30 ENCOUNTER — Encounter: Payer: Self-pay | Admitting: *Deleted

## 2022-02-04 ENCOUNTER — Encounter: Payer: Self-pay | Admitting: Genetic Counselor

## 2022-02-05 ENCOUNTER — Ambulatory Visit: Payer: Self-pay

## 2022-02-05 ENCOUNTER — Ambulatory Visit (INDEPENDENT_AMBULATORY_CARE_PROVIDER_SITE_OTHER): Payer: Medicare Other | Admitting: Family Medicine

## 2022-02-05 DIAGNOSIS — M25562 Pain in left knee: Secondary | ICD-10-CM | POA: Diagnosis not present

## 2022-02-05 DIAGNOSIS — M170 Bilateral primary osteoarthritis of knee: Secondary | ICD-10-CM | POA: Diagnosis not present

## 2022-02-05 DIAGNOSIS — G8929 Other chronic pain: Secondary | ICD-10-CM

## 2022-02-05 NOTE — Progress Notes (Signed)
Karena Addison presents to clinic today for Orthovisc injection left knee 2/3 ? ?Procedure: Real-time Ultrasound Guided Injection of left knee superior lateral patellar space ?Device: Philips Affiniti 50G ?Images permanently stored and available for review in PACS ?Verbal informed consent obtained.  Discussed risks and benefits of procedure. Warned about infection, bleeding, damage to structures among others. ?Patient expresses understanding and agreement ?Time-out conducted.   ?Noted no overlying erythema, induration, or other signs of local infection.   ?Skin prepped in a sterile fashion.   ?Local anesthesia: Topical Ethyl chloride.   ?With sterile technique and under real time ultrasound guidance: Orthovisc 30 mg injected into knee joint. Fluid seen entering the joint capsule.   ?Completed without difficulty   ?Advised to call if fevers/chills, erythema, induration, drainage, or persistent bleeding.   ?Images permanently stored and available for review in the ultrasound unit.  ?Impression: Technically successful ultrasound guided injection. ? ?Lot number: [id number] ? ?Return in 1 week for Orthovisc injection left knee 3/3 ?

## 2022-02-05 NOTE — Patient Instructions (Signed)
Good to see you today. ? ?You had a L knee Orthovisc injection 2/3.  Call or go to the ER if you develop a large red swollen joint with extreme pain or oozing puss.  ? ?Follow-up next week for your final Orthovisc injection. ?

## 2022-02-12 ENCOUNTER — Ambulatory Visit (INDEPENDENT_AMBULATORY_CARE_PROVIDER_SITE_OTHER): Payer: Medicare Other | Admitting: Family Medicine

## 2022-02-12 ENCOUNTER — Encounter: Payer: Self-pay | Admitting: Hematology and Oncology

## 2022-02-12 ENCOUNTER — Encounter: Payer: Self-pay | Admitting: *Deleted

## 2022-02-12 ENCOUNTER — Other Ambulatory Visit: Payer: Self-pay

## 2022-02-12 ENCOUNTER — Ambulatory Visit: Payer: Self-pay

## 2022-02-12 ENCOUNTER — Inpatient Hospital Stay: Payer: Medicare Other | Attending: Hematology and Oncology | Admitting: Hematology and Oncology

## 2022-02-12 VITALS — BP 145/65 | HR 77 | Temp 97.9°F | Resp 17 | Wt 165.4 lb

## 2022-02-12 DIAGNOSIS — G8929 Other chronic pain: Secondary | ICD-10-CM

## 2022-02-12 DIAGNOSIS — Z13820 Encounter for screening for osteoporosis: Secondary | ICD-10-CM | POA: Diagnosis not present

## 2022-02-12 DIAGNOSIS — M170 Bilateral primary osteoarthritis of knee: Secondary | ICD-10-CM | POA: Diagnosis not present

## 2022-02-12 DIAGNOSIS — Z79899 Other long term (current) drug therapy: Secondary | ICD-10-CM | POA: Diagnosis not present

## 2022-02-12 DIAGNOSIS — M25562 Pain in left knee: Secondary | ICD-10-CM

## 2022-02-12 DIAGNOSIS — D0511 Intraductal carcinoma in situ of right breast: Secondary | ICD-10-CM | POA: Diagnosis not present

## 2022-02-12 MED ORDER — ANASTROZOLE 1 MG PO TABS: 1.0000 mg | ORAL_TABLET | Freq: Every day | ORAL | 3 refills | Status: DC

## 2022-02-12 NOTE — Progress Notes (Signed)
Felicia Perry presents to clinic today for Orthovisc injection left knee 3/3 ? ?Anterior knee injection: left ?Consent obtained and timeout performed. ?Risks reviewed including potential risk for infection, etc. ?Area medial to the patella tendon identified and marked. ?Skin sterilized with isopropyl alcohol. ?Cold spray applied ?22-gauge needle used to access the knee joint and '30mg'$  of orthovisc injected into the knee joint ?Patient tolerated the procedure well. ? ?Lot number: [id number] ? ?Return as needed ?

## 2022-02-12 NOTE — Patient Instructions (Signed)
Good to see you today. ? ?You had your final L knee Orthovisc injection.  Call or go to the ER if you develop a large red swollen joint with extreme pain or oozing puss.  ? ?Follow-up as needed. ?

## 2022-02-12 NOTE — Progress Notes (Signed)
[place] ?CONSULT NOTE ? ?Patient Care Team: ?Marin Olp, MD as PCP - General (Family Medicine) ?[name], MD as PCP - Cardiology (Cardiology) ?[name], DO as Consulting Physician (Sports Medicine) ?Starlyn Skeans as Librarian, academic (Dermatology) ?Edythe Clarity, [facility] as Pharmacist (Pharmacist) ?Mauro Kaufmann, RN as Oncology Nurse Navigator ?Rockwell Germany, RN as Oncology Nurse Navigator ?Erroll Luna, MD as Consulting Physician (General Surgery) ?[name], MD as Consulting Physician (Hematology and Oncology) ?Gery Pray, MD as Consulting Physician (Radiation Oncology) ? ?CHIEF COMPLAINTS/PURPOSE OF CONSULTATION:  ?Breast cancer follow up. ? ?HISTORY OF PRESENTING ILLNESS:  ?Felicia Perry 76 y.o. female is here because of recent diagnosis of right breast DCIS ? ?12/02/2021, mammogram showed possible mass in the right breast.  In the left breast no findings suspicious for malignancy ?12/25/2021 diagnostic mammogram showed indeterminate right breast mass at 930 o'clock. ?12/30/2021, ultrasound-guided biopsy of the breast mass showed ductal carcinoma in situ, ER 100%, positive, strong staining, PR 100%, positive, strong staining ?Patient is presented in breast [place] for additional recommendations.  [name] arrived to the appointment today with her daughter-in-law.  She denies any palpable abnormalities in the breast or prior diagnosis of malignancy. ?She has past medical history of borderline diabetes, coronary artery disease status post 3 stents back in 2017.  She had fatty tumor removed from the breast a while ago.  No family history of any malignancy. ?I reviewed her records extensively and collaborated the history with the patient. ? ?SUMMARY OF ONCOLOGIC HISTORY: ?Oncology History  ?Ductal carcinoma in situ (DCIS) of right breast  ?12/18/2021 Cancer Staging  ? Staging form: Breast, AJCC 8th Edition ?- Clinical stage from 12/18/2021: Stage 0  (cTis (DCIS), cN0, cM0, ER+, PR+, HER2: Not Assessed) - Signed by [name], MD on 01/07/2022 ?Stage prefix: Initial diagnosis ?Nuclear grade: G2 ?Laterality: Right ?Stage used in treatment planning: Yes ?National guidelines used in treatment planning: Yes ?Type of national guideline used in treatment planning: NCCN ? ?  ?01/05/2022 Initial Diagnosis  ? Ductal carcinoma in situ (DCIS) of right breast ? ?  ? Genetic Testing  ? [facility] CancerNext Panel was Negative. Report date is 01/17/2022. ? ?The CancerNext gene panel offered by [employer] includes sequencing, rearrangement analysis, and RNA analysis for the following 36 genes:   APC, ATM, AXIN2, BARD1, BMPR1A, BRCA1, BRCA2, BRIP1, CDH1, CDK4, CDKN2A, CHEK2, DICER1, HOXB13, EPCAM, GREM1, MLH1, MSH2, MSH3, MSH6, MUTYH, NBN, NF1, NTHL1, PALB2, PMS2, POLD1, POLE, PTEN, RAD51C, RAD51D, RECQL, SMAD4, SMARCA4, STK11, and TP53.  ?  ?01/22/2022 Surgery  ? She had right breast lumpectomy pathology showed invasive well-differentiated grade 1 ductal carcinoma, extensive intermediate grade DCIS.  Invasive tumor measured 2 mm in greatest dimension, margins free prognostics on the invasive tumor showed ER +85% moderate staining, progesterone receptor negative, KI of less than 5% and HER2 negative. ?Oncotype not needed since her tumor is less than 5 mm ?  ? ?She is healing well from her surgery.  She denies any new complaints.  She is scheduled to see Dr. Sondra Come from radiation oncology on 02/23/2022. ?Rest of the pertinent 10 point ROS reviewed and negative ? ? ?MEDICAL HISTORY:  ?Past Medical History:  ?Diagnosis Date  ? Adenomatous polyp 11/23/2005  ? Anxiety   ? 01/09/22- patient denies anxiety  ? Asthma 2003  ? Basal cell carcinoma 2021  ? right cheek per patient at [place] [facility]  ? Carotid stenosis   ? Chronic diastolic CHF (  congestive heart failure) ([place]) 2017  ? Echo 2021 was normal  ? COPD (chronic obstructive pulmonary disease) ([place])   ? Coronary artery disease  2017  ? a. HC on 08/14/16 showed RCA CTO s/p PCI, 60% LCX disease managed medically with recs to consider staged PCI if continued symptoms.   ? Depression 2008  ? pt reports MD diagnosed but she does not feel depressed  ? Diabetes mellitus without complication ([place])   ? DIVERTICULOSIS, COLON 04/22/2007  ?     ? GERD (gastroesophageal reflux disease) 2012  ? History of shingles   ? Hypertension 2008  ? Hypertensive heart disease   ? Insomnia 2003  ? Internal hemorrhoids   ? Osteoarthritis 2008  ? "knees, hands, lower back" (08/14/2016)  ? Pap smear for cervical cancer screening 2002  ? results normal  ? PUD (peptic ulcer disease) 1989  ? Seasonal allergies 2013  ? Sleep apnea   ? a. resolved with weight loss  ? ? ?SURGICAL HISTORY: ?Past Surgical History:  ?Procedure Laterality Date  ? BREAST BIOPSY Right 12/30/2021  ? BREAST EXCISIONAL BIOPSY Left   ? 1980's x 3 - all benign  ? BREAST EXCISIONAL BIOPSY Right   ? 1980's x 1 or 2 - all benign  ? BREAST LUMPECTOMY WITH RADIOACTIVE SEED LOCALIZATION Right 01/22/2022  ? Procedure: RIGHT BREAST LUMPECTOMY WITH RADIOACTIVE SEED LOCALIZATION;  Surgeon: Erroll Luna, MD;  Location: [facility];  Service: General;  Laterality: Right;  ? BREAST SURGERY Left 1980s  ? Fibrous Tumors removed   ? CARDIAC CATHETERIZATION N/A 08/14/2016  ? Procedure: Left Heart Cath and Coronary Angiography;  Surgeon: Sherren Mocha, MD;  Location: [facility];  Service: Cardiovascular;  Laterality: N/A;  ? CARDIAC CATHETERIZATION N/A 08/14/2016  ? Procedure: Coronary Stent Intervention;  Surgeon: Sherren Mocha, MD;  Location: [facility];  Service: Cardiovascular;  Laterality: N/A;  ? CATARACT EXTRACTION W/ INTRAOCULAR LENS  IMPLANT, BILATERAL Bilateral 05/2009  ? COLONOSCOPY N/A 2016  ? Cologuard 2019  ? CORONARY ANGIOPLASTY    ? detached retina left eye    ? may 2019  ? TUBAL LIGATION  1970s  ? ? ?SOCIAL HISTORY: ?Social History  ? ?Socioeconomic History  ? Marital  status: Divorced  ?  Spouse name: Not on file  ? Number of children: Not on file  ? Years of education: Not on file  ? Highest education level: Not on file  ?Occupational History  ? Occupation: Cleaning  ?Tobacco Use  ? Smoking status: Former  ?  Packs/day: 1.00  ?  Years: 26.00  ?  Pack years: 26.00  ?  Types: Cigarettes  ?  Quit date: 1989  ?  Years since quitting: 34.3  ? Smokeless tobacco: Never  ?Vaping Use  ? Vaping Use: Never used  ?Substance and Sexual Activity  ? Alcohol use: No  ? Drug use: No  ? Sexual activity: Not Currently  ?Other Topics Concern  ? Not on file  ?Social History Narrative  ? Divorced for 59 years in 2016. 2 sons- 1 son had been in prison now living with her in 2019.  2 granddaughters with 1 living close.    ?   ? 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   ? Hobbies: watch tv-survivor, dancing with the stars, enjoy alone time. Best friend [name] patient of Dr. Yong Channel. Enjoys work, time on Teaching laboratory technician.   ? ?  Social Determinants of Health  ? ?Financial Resource Strain: Low Risk   ? Difficulty of Paying Living Expenses: Not very hard  ?Food Insecurity: No Food Insecurity  ? Worried About Charity fundraiser in the Last Year: Never true  ? Ran Out of Food in the Last Year: Never true  ?Transportation Needs: No Transportation Needs  ? Lack of Transportation (Medical): No  ? Lack of Transportation (Non-Medical): No  ?Physical Activity: Sufficiently Active  ? Days of Exercise per Week: 2 days  ? Minutes of Exercise per Session: 120 min  ?Stress: Stress Concern Present  ? Feeling of Stress : To some extent  ?Social Connections: Moderately Isolated  ? Frequency of Communication with Friends and Family: More than three times a week  ? Frequency of Social Gatherings with Friends and Family: More than three times a week  ? Attends Religious Services: More than 4 times per year  ? Active Member of Clubs or Organizations: No  ? Attends Theatre manager Meetings: Never  ? Marital Status: Divorced  ?Intimate Partner Violence: Not At Risk  ? Fear of Current or Ex-Partner: No  ? Emotionally Abused: No  ? Physically Abused: No  ? Sexually Abuse

## 2022-02-12 NOTE — Assessment & Plan Note (Signed)
This is a very pleasant 76 year old female patient with right breast DCIS referred to breast [place] for additional recommendation.  She is now status postlumpectomy and final pathology showed 2 mm invasive ductal carcinoma, grade 1, free margins, ER +85%, PR negative, HER2 negative as well as a grade 2 intermediate DCIS.  She is scheduled to meet radiation oncology team in the first week of May. ?She denies any new health complaints. ?We have once again discussed about role of antiestrogen therapy.  We have discussed the following details about antiestrogen therapy.  ?We have discussed options for antiestrogen therapy today ? ?With regards to aromatase inhibitors, we discussed mechanism of action, adverse effects including but not limited to post menopausal symptoms, arthralgias, myalgias, increased risk of cardiovascular events and bone loss.  ? ?Anastrozole has been prescribed to the pharmacy of her choice.  She was clearly explained to take the anastrozole if she were not to proceed with adjuvant radiation or after the completion of adjuvant radiation. ? ?Thank you for consulting [place] in the care of this patient.  Please not hesitate to contact us with any additional questions or concerns. ? ? ? ? ?

## 2022-02-16 ENCOUNTER — Ambulatory Visit: Payer: Medicare Other | Admitting: Family Medicine

## 2022-02-16 ENCOUNTER — Ambulatory Visit: Payer: Medicare Other | Admitting: Radiation Oncology

## 2022-02-16 ENCOUNTER — Ambulatory Visit: Payer: Medicare Other

## 2022-02-16 NOTE — Progress Notes (Signed)
Location of Breast Cancer: right breast ? ?Histology per Pathology Report: Right breast 9:30 o'clock biopsy on 12/30/21 showed: intermediate grade ductal carcinoma in-situ, measuring 0.4 cm in the greatest linear extent. ? ?Receptor Status: estrogen receptor, 100% positive and progesterone receptor, 100% positive, both with strong staining intensity. HER2 not assessed.  ? ?Did patient present with symptoms (if so, please note symptoms) or was this found on screening mammography?: routine screening mammography on 12/02/21 showing a possible abnormality in the right breast ? ?Past/Anticipated interventions by surgeon, if any:  ?Procedure: Right breast seed localized lumpectomy ?Surgeon: Erroll Luna, MD ? ?Past/Anticipated interventions by medical oncology, if any: Chemotherapy Dr Chryl Heck ?Recommendation: ?1. Breast conserving surgery ?2. Followed by adjuvant radiation therapy ?3. Followed by antiestrogen therapy with tamoxifen/aromatase inhibitors based on menopausal status ?5 years ? ?Lymphedema issues, if any:  no   ? ?Pain issues, if any:  no  ? ?SAFETY ISSUES: ?Prior radiation? no ?Pacemaker/ICD? no ?Possible current pregnancy?no, postmenopausal ?Is the patient on methotrexate? no ? ?Current Complaints / other details:  nausea ?   ?Vitals:  ? 02/23/22 0745  ?BP: (!) 186/76  ?Resp: 18  ?Temp: (!) 97.2 ?F (36.2 ?C)  ?TempSrc: Temporal  ?SpO2: 100%  ?Weight: 165 lb 4 oz (75 kg)  ?Height: 5' (1.524 m)  ? ? ? ? ?

## 2022-02-18 ENCOUNTER — Ambulatory Visit: Payer: Medicare Other | Admitting: Family Medicine

## 2022-02-20 ENCOUNTER — Other Ambulatory Visit: Payer: Self-pay | Admitting: Family Medicine

## 2022-02-21 NOTE — Progress Notes (Signed)
?Radiation Oncology         [phone] ?________________________________ ? ?Name: Felicia Perry MRN: [id number]  ?Date: 02/23/2022  DOB: Jul 10, 1946 ? ?Re-Evaluation Note ? ?CC: Felicia Olp, MD  Felicia Pike, MD ? ?  ICD-10-CM   ?1. Ductal carcinoma in situ (DCIS) of right breast  D05.11   ?  ? ? ?Diagnosis:  S/p right lumpectomy: Stage IA (T1a, Nx) Right Breast, Invasive ductal adenocarcinoma with extensive intermediate grade DCIS, ER+ / PR- / Her2-, Grade 1 ? ?Narrative:  The patient returns today to discuss radiation treatment options. She was seen in the multidisciplinary breast clinic on 01/07/22.  ? ?Since consultation, she underwent genetic testing on 01/07/22. Results showed no clinically significant variants detected by BRCAplus or +RNAinsight testing. . ? ?She opted to proceed with right breast lumpectomy without nodal biopsies on 01/22/22 under the care of Dr. Brantley Stage. Pathology from the procedure revealed: grade 1 invasive well differentiated ductal adenocarcinoma measuring 2 mm in the greatest dimension, and extensive intermediate grade ductal carcinoma in situ without necrosis and involving an intraductal papilloma. All margins negative for both invasive and in-situ carcinoma. Margin status to invasive disease of 1 mm from the inferior margin. Margin status to in-situ disease of 0.3 mm from the inferior margin. Prognostic indicators significant for: estrogen receptor 85% positive with moderate staining intensity; progesterone receptor negative; proliferation marker Ki67 at <5%; Her2 status negative; Grade 1.  ? ?Accordingly, the patient followed up with Dr. Chryl Heck on 02/12/22 to discuss antiestrogen treatment options. Following discussion of the risks and benefits, the patient agreed to proceed with anastrozole. From a post-op standpoint, the patient was noted to be healing well and denied any complaints. (Dr. Chryl Heck has instructed the patient to take anastrozole if she does not to proceed  with adjuvant radiation, or after completion of XRT). ? ?On review of systems, the patient reports recovering well from her surgery. She denies significant swelling or pain within the breast and any other symptoms.  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 ? ? ?Allergies:  is allergic to celexa [citalopram hydrobromide], cephalosporins, clarithromycin, doxycycline hyclate, levofloxacin, penicillins, rosuvastatin, and zolpidem tartrate. ? ?Meds: ?Current Outpatient Medications  ?Medication Sig Dispense Refill  ? acetaminophen (TYLENOL) 325 MG tablet Take 650 mg by mouth every 6 (six) hours as needed.    ? albuterol (VENTOLIN HFA) 108 (90 Base) MCG/ACT inhaler Inhale 2 puffs into the lungs every 6 (six) hours as needed for wheezing or shortness of breath. 18 g 5  ? ALPRAZolam (XANAX) 1 MG tablet TAKE ONE TABLET AT BEDTIME AS NEEDED FOR SLEEP -DO NOT DRIVE FOR 8 HOURS AFTER TAKING 90 tablet 1  ? amLODipine (NORVASC) 10 MG tablet Take 1 tablet (10 mg total) by mouth daily. 90 tablet 3  ? anastrozole (ARIMIDEX) 1 MG tablet Take 1 tablet (1 mg total) by mouth daily. 30 tablet 3  ? aspirin EC 81 MG tablet Take 1 tablet (81 mg total) by mouth daily. Swallow whole. 90 tablet 3  ? Bempedoic Acid-Ezetimibe (NEXLIZET) 180-10 MG TABS Take 180 mg by mouth daily. 90 tablet 3  ? budesonide-formoterol (SYMBICORT) 160-4.5 MCG/ACT inhaler Inhale 2 puffs into the lungs 2 (two) times daily. 1 each 11  ? carvedilol (COREG) 6.25 MG tablet Take 1 tablet (6.25 mg total) by mouth 2 (two) times daily. 180 tablet 3  ? famotidine (PEPCID) 20 MG tablet TAKE ONE TABLET TWICE DAILY 60 tablet 5  ? irbesartan (AVAPRO) 300 MG tablet  Take 1 tablet (300 mg total) by mouth daily. 90 tablet 2  ? levocetirizine (XYZAL) 5 MG tablet TAKE ONE TABLET EVERY MORNING 90 tablet 0  ? omega-3 acid ethyl esters (LOVAZA) 1 g capsule TAKE TWO CAPSULES EVERY DAY 180 capsule 3  ? venlafaxine XR (EFFEXOR-XR) 75 MG 24 hr capsule TAKE ONE CAPSULE EACH  DAY WITH BREAKFAST 90 capsule 2  ? nitroGLYCERIN (NITROSTAT) 0.4 MG SL tablet Place 1 tablet (0.4 mg total) under the tongue every 5 (five) minutes as needed for chest pain. 25 tablet 6  ? oxyCODONE (OXY IR/ROXICODONE) 5 MG immediate release tablet Take 1 tablet (5 mg total) by mouth every 6 (six) hours as needed for severe pain. (Patient not taking: Reported on 02/23/2022) 15 tablet 0  ? ?No current facility-administered medications for this encounter.  ? ? ?Physical Findings: ?The patient is in no acute distress. Patient is alert and oriented. ? height is 5' (1.524 m) and weight is 165 lb 4 oz (75 kg). Her temporal temperature is 97.2 ?F (36.2 ?C) (abnormal). Her blood pressure is 186/76 (abnormal). Her respiration is 18 and oxygen saturation is 100%.  Lungs are clear to auscultation bilaterally. Heart has regular rate and rhythm. No palpable cervical, supraclavicular, or axillary adenopathy. Abdomen soft, non-tender, normal bowel sounds. ?Left Breast: no palpable mass, nipple discharge or bleeding. ?Right Breast: Well-healing lumpectomy scar in the lateral aspect of the breast area.  No signs of infection.  No drainage from the lumpectomy scar.  No dominant mass appreciated in the breast.  No nipple discharge or bleeding. ? ?Lab Findings: ?Lab Results  ?Component Value Date  ? WBC 14.5 (H) 01/07/2022  ? HGB 14.0 01/07/2022  ? HCT 40.7 01/07/2022  ? MCV 87.9 01/07/2022  ? PLT 359 01/07/2022  ? ? ?Radiographic Findings: ?No results found. ? ?Impression:  S/p right lumpectomy: Stage IA (T1a, Nx) Right Breast, Invasive ductal adenocarcinoma with extensive intermediate grade DCIS, ER+ / PR- / Her2-, Grade 1 ? ?She is doing well since her surgery.  She has met with Dr. Chryl Heck  and she is planning on proceeding with adjuvant hormonal therapy.  We discussed anticipated recurrence rates within the breast with and without radiation therapy.  She understands that addition of radiation therapy in this situation does not improve  overall survival but does have a local recurrence issue benefit. ? ?I discussed the anticipated course of treatment, side effects and potential long-term effects of right breast radiation therapy in detail. ? ?After careful consideration the patient would like to be aggressive and proceed with both adjuvant hormonal therapy and adjuvant radiation therapy. ? ?Plan:  Patient is scheduled for CT simulation later today.  Treatments to begin approximately a week.  She would be a good candidate for hypofractionated accelerated radiation therapy over approximately 4 weeks.  Would recommend a boost to the lumpectomy cavity given the close surgical margin on the invasive and noninvasive component. ? ?----------------------------------- ? ?Blair Promise, PhD, MD ? ?This document serves as a record of services personally performed by Gery Pray, MD. It was created on his behalf by Roney Mans, a trained medical scribe. The creation of this record is based on the scribe's personal observations and the provider's statements to them. This document has been checked and approved by the attending provider. ? ?

## 2022-02-22 DIAGNOSIS — E785 Hyperlipidemia, unspecified: Secondary | ICD-10-CM

## 2022-02-22 DIAGNOSIS — I10 Essential (primary) hypertension: Secondary | ICD-10-CM

## 2022-02-22 DIAGNOSIS — E118 Type 2 diabetes mellitus with unspecified complications: Secondary | ICD-10-CM

## 2022-02-23 ENCOUNTER — Ambulatory Visit
Admission: RE | Admit: 2022-02-23 | Discharge: 2022-02-23 | Disposition: A | Discharge status: Home / Self Care | Payer: Medicare Other | Source: Ambulatory Visit | Attending: Radiation Oncology | Admitting: Radiation Oncology

## 2022-02-23 ENCOUNTER — Other Ambulatory Visit: Payer: Self-pay

## 2022-02-23 ENCOUNTER — Ambulatory Visit: Payer: Medicare Other | Admitting: Radiation Oncology

## 2022-02-23 ENCOUNTER — Encounter: Payer: Self-pay | Admitting: Radiation Oncology

## 2022-02-23 VITALS — BP 186/76 | Temp 97.2°F | Resp 18 | Ht 60.0 in | Wt 165.2 lb

## 2022-02-23 DIAGNOSIS — Z510 Encounter for antineoplastic radiation therapy: Secondary | ICD-10-CM | POA: Diagnosis not present

## 2022-02-23 DIAGNOSIS — Z7982 Long term (current) use of aspirin: Secondary | ICD-10-CM | POA: Diagnosis not present

## 2022-02-23 DIAGNOSIS — Z79899 Other long term (current) drug therapy: Secondary | ICD-10-CM | POA: Diagnosis not present

## 2022-02-23 DIAGNOSIS — Z170 Estrogen receptor positive status [ER+]: Secondary | ICD-10-CM | POA: Diagnosis not present

## 2022-02-23 DIAGNOSIS — D0511 Intraductal carcinoma in situ of right breast: Secondary | ICD-10-CM

## 2022-02-23 NOTE — Progress Notes (Signed)
See MD note for nursing evaluation. °

## 2022-02-24 ENCOUNTER — Telehealth: Payer: Self-pay | Admitting: Pharmacist

## 2022-02-24 NOTE — Progress Notes (Signed)
? ? ?Chronic Care Management ?Pharmacy Assistant  ? ?Name: Felicia Perry  MRN: [id number] DOB: [birth year] ? ? ?Reason for Encounter: General Adherence Call ?  ? ?Recent office visits:  ?None ? ?Recent consult visits:  ?02/12/2022 OV (Oncology) Felicia Pike, MD; Anastrozole has been prescribed to the pharmacy of her choice.  She was clearly explained to take the anastrozole if she were not to proceed with adjuvant radiation or after the completion of adjuvant radiation. ? ?02/12/2022 OV (Sports Medicine) Felicia Hams, MD; no medication changes indicated. ? ?02/05/2022 OV (Sports Medicine) Felicia Hams, MD; no medication changes indicated. ? ?Hospital visits:  ?None in previous 6 months ? ?Medications: ?Outpatient Encounter Medications as of 02/24/2022  ?Medication Sig  ? acetaminophen (TYLENOL) 325 MG tablet Take 650 mg by mouth every 6 (six) hours as needed.  ? albuterol (VENTOLIN HFA) 108 (90 Base) MCG/ACT inhaler Inhale 2 puffs into the lungs every 6 (six) hours as needed for wheezing or shortness of breath.  ? ALPRAZolam (XANAX) 1 MG tablet TAKE ONE TABLET AT BEDTIME AS NEEDED FOR SLEEP -DO NOT DRIVE FOR 8 HOURS AFTER TAKING  ? amLODipine (NORVASC) 10 MG tablet Take 1 tablet (10 mg total) by mouth daily.  ? anastrozole (ARIMIDEX) 1 MG tablet Take 1 tablet (1 mg total) by mouth daily.  ? aspirin EC 81 MG tablet Take 1 tablet (81 mg total) by mouth daily. Swallow whole.  ? Bempedoic Acid-Ezetimibe (NEXLIZET) 180-10 MG TABS Take 180 mg by mouth daily.  ? budesonide-formoterol (SYMBICORT) 160-4.5 MCG/ACT inhaler Inhale 2 puffs into the lungs 2 (two) times daily.  ? carvedilol (COREG) 6.25 MG tablet Take 1 tablet (6.25 mg total) by mouth 2 (two) times daily.  ? famotidine (PEPCID) 20 MG tablet TAKE ONE TABLET TWICE DAILY  ? irbesartan (AVAPRO) 300 MG tablet Take 1 tablet (300 mg total) by mouth daily.  ? levocetirizine (XYZAL) 5 MG tablet TAKE ONE TABLET EVERY MORNING  ? nitroGLYCERIN (NITROSTAT) 0.4 MG SL tablet  Place 1 tablet (0.4 mg total) under the tongue every 5 (five) minutes as needed for chest pain.  ? omega-3 acid ethyl esters (LOVAZA) 1 g capsule TAKE TWO CAPSULES EVERY DAY  ? oxyCODONE (OXY IR/ROXICODONE) 5 MG immediate release tablet Take 1 tablet (5 mg total) by mouth every 6 (six) hours as needed for severe pain. (Patient not taking: Reported on 02/23/2022)  ? venlafaxine XR (EFFEXOR-XR) 75 MG 24 hr capsule TAKE ONE CAPSULE EACH DAY WITH BREAKFAST  ? ?No facility-administered encounter medications on file as of 02/24/2022.  ? ?Contacted Felicia Perry for General Review Call ? ? ?Chart Review: ? ?Have there been any documented new, changed, or discontinued medications since last visit? Yes ?Has there been any documented recent hospitalizations or ED visits since last visit with Clinical Pharmacist? No ?Brief Summary: She was clearly explained to take the anastrozole if she were not to proceed with adjuvant radiation or after the completion of adjuvant radiation. Patient states she started taking Anastrozole on 02/24/2022.  ? ? ?Adherence Review: ? ?Does the Clinical Pharmacist Assistant have access to adherence rates? Yes ?Adherence rates for STAR metric medications (List medication(s)/day supply/ last 2 fill dates). ?Adherence rates for medications indicated for disease state being reviewed (List medication(s)/day supply/ last 2 fill dates). ?Does the patient have >5 day gap between last estimated fill dates for any of the above medications or other medication gaps? No ?Reason for medication gaps. ? ? ?Disease State Questions: ? ?Able  to connect with Patient? Yes ?Did patient have any problems with their health recently? Yes ?Note problems and Concerns: Patient has had some issues with her knees. She recently had some injections in her knees. ?Have you had any admissions or emergency room visits or worsening of your con ?dition(s) since last visit? No ?Details of ED visit, hospital visit and/or worsening  condition(s): ?Have you had any visits with new specialists or providers since your last visit? Yes ?Explain: Patient has seen her orthopedic for knee pain. ?Have you had any new health care problem(s) since your last visit? No ?New problem(s) reported: ?Have you run out of any of your medications since you last spoke with clinical pharmacist? No ?What caused you to run out of your medications? ?Are there any medications you are not taking as prescribed? No ?What kept you from taking your medications as prescribed? ?Are you having any issues or side effects with your medications? No ?Note of issues or side effects: ?Do you have any other health concerns or questions you want to discuss with your Clinical Pharmacist before your next visit? No ?Note additional concerns and questions from Patient. ?Are there any health concerns that you feel we can do a better job addressing? No ?Note Patient's response. ?Are you having any problems with any of the following since the last visit: (select all that apply) ? None ? Details: ?12. Any falls since last visit? No ? Details: ?13. Any increased or uncontrolled pain since last visit? No ? Details: ?14. Next visit Type: telephone ?      Visit with: Clinical Pharmacist ?       Date: 08/04/2022 ?       Time: 3:45 pm ? ?15. Additional Details? No  ? ?Care Gaps: ?Medicare Annual Wellness: Completed 09/29/2021 ?Ophthalmology Exam: Next due on 12/26/2022 ?Foot Exam: Next due on 08/12/2022 ?Hemoglobin A1C: 7.5% on 08/12/2021 ?Colonoscopy: Discontinued ?Fecal DNA (Cologuard): Next due on 01/15/2025 ?Dexa Scan: Completed ?Mammogram: Last completed 09/18/2021 ? ?Future Appointments  ?Date Time Provider [place]  ?03/04/2022 11:15 AM Gery Pray, MD [facility] None  ?03/05/2022 11:15 AM [facility] [id number] [facility] None  ?03/06/2022 11:15 AM [facility] LINAC 4 [facility] None  ?03/09/2022 11:00 AM [facility] [id number] [facility] None  ?03/10/2022  9:30 AM [facility] DEXA  [facility]-BREAST CE  ?03/10/2022 11:15 AM [facility] [id number] [facility] None  ?03/11/2022 11:15 AM [facility] [id number] [facility] None  ?03/12/2022 11:15 AM [facility] [id number] [facility] None  ?03/13/2022 11:15 AM [facility] [id number] [facility] None  ?03/16/2022 11:15 AM [facility] [id number] [facility] None  ?03/17/2022 11:15 AM [facility] [id number] [facility] None  ?03/18/2022 11:15 AM [facility] [id number] [facility] None  ?03/18/2022 11:45 AM [name], DO [facility] None  ?03/19/2022 11:15 AM [facility] [id number] [facility] None  ?03/20/2022 11:15 AM [facility] [id number] [facility] None  ?03/24/2022 11:15 AM [facility] [id number] [facility] None  ?03/24/2022 11:30 AM Gery Pray, MD [facility] None  ?03/25/2022 11:15 AM [facility] [id number] [facility] None  ?03/26/2022 11:15 AM [facility] [id number] [facility] None  ?03/27/2022 11:15 AM [facility] [id number] [facility] None  ?03/30/2022 11:15 AM [facility] [id number] [facility] None  ?03/31/2022 11:15 AM [facility] [id number] [facility] None  ?04/01/2022 11:15 AM [facility] [id number] [facility] None  ?04/02/2022 11:15 AM [facility] [id number] [facility] None  ?04/08/2022 10:20 AM Marin Olp, MD [facility]  ?06/16/2022  8:30 AM Felicia Pike, MD [facility] None  ?06/26/2022  4:00 PM MC-CV NL VASC 3 [facility]  ?08/04/2022  3:45 PM [facility] CCM PHARMACIST [facility]  ?10/12/2022  8:45 AM [facility] HEALTH COACH [facility]  ?  01/07/2023  2:30 PM [place], Ronalee Red, PA-C [facility]  ? ? ?Star Rating Drugs: ?Irbesartan 300 mg last filled 02/20/2022 90 DS ? ?April D Calhoun, [place] ?Clinical Pharmacist Assistant ?[phone] ? ?

## 2022-02-26 DIAGNOSIS — Z510 Encounter for antineoplastic radiation therapy: Secondary | ICD-10-CM | POA: Diagnosis not present

## 2022-02-26 DIAGNOSIS — D0511 Intraductal carcinoma in situ of right breast: Secondary | ICD-10-CM | POA: Diagnosis not present

## 2022-02-26 DIAGNOSIS — Z170 Estrogen receptor positive status [ER+]: Secondary | ICD-10-CM | POA: Diagnosis not present

## 2022-03-02 ENCOUNTER — Encounter: Payer: Self-pay | Admitting: *Deleted

## 2022-03-03 ENCOUNTER — Telehealth: Payer: Self-pay | Admitting: Hematology and Oncology

## 2022-03-03 NOTE — Telephone Encounter (Signed)
.  Called patient to schedule appointment per 5.9 inbasket, patient is aware of date and time.   ?

## 2022-03-04 ENCOUNTER — Ambulatory Visit
Admission: RE | Admit: 2022-03-04 | Discharge: 2022-03-04 | Disposition: A | Discharge status: Home / Self Care | Payer: Medicare Other | Source: Ambulatory Visit | Attending: Radiation Oncology | Admitting: Radiation Oncology

## 2022-03-04 ENCOUNTER — Other Ambulatory Visit: Payer: Self-pay

## 2022-03-04 DIAGNOSIS — D0511 Intraductal carcinoma in situ of right breast: Secondary | ICD-10-CM | POA: Diagnosis not present

## 2022-03-04 DIAGNOSIS — Z510 Encounter for antineoplastic radiation therapy: Secondary | ICD-10-CM | POA: Diagnosis not present

## 2022-03-04 DIAGNOSIS — Z170 Estrogen receptor positive status [ER+]: Secondary | ICD-10-CM | POA: Diagnosis not present

## 2022-03-04 LAB — RAD ONC ARIA SESSION SUMMARY
Course Elapsed Days: 0
Plan Fractions Treated to Date: 1
Plan Prescribed Dose Per Fraction: 2.67 Gy
Plan Total Fractions Prescribed: 15
Plan Total Prescribed Dose: 40.05 Gy
Reference Point Dosage Given to Date: 2.67 Gy
Reference Point Session Dosage Given: 2.67 Gy
Session Number: 1

## 2022-03-05 ENCOUNTER — Other Ambulatory Visit: Payer: Self-pay

## 2022-03-05 ENCOUNTER — Ambulatory Visit
Admission: RE | Admit: 2022-03-05 | Discharge: 2022-03-05 | Disposition: A | Discharge status: Home / Self Care | Payer: Medicare Other | Source: Ambulatory Visit | Attending: Radiation Oncology | Admitting: Radiation Oncology

## 2022-03-05 DIAGNOSIS — Z510 Encounter for antineoplastic radiation therapy: Secondary | ICD-10-CM | POA: Diagnosis not present

## 2022-03-05 DIAGNOSIS — D0511 Intraductal carcinoma in situ of right breast: Secondary | ICD-10-CM | POA: Diagnosis not present

## 2022-03-05 DIAGNOSIS — Z170 Estrogen receptor positive status [ER+]: Secondary | ICD-10-CM | POA: Diagnosis not present

## 2022-03-05 LAB — RAD ONC ARIA SESSION SUMMARY
Course Elapsed Days: 1
Plan Fractions Treated to Date: 2
Plan Prescribed Dose Per Fraction: 2.67 Gy
Plan Total Fractions Prescribed: 15
Plan Total Prescribed Dose: 40.05 Gy
Reference Point Dosage Given to Date: 5.34 Gy
Reference Point Session Dosage Given: 2.67 Gy
Session Number: 2

## 2022-03-06 ENCOUNTER — Ambulatory Visit
Admission: RE | Admit: 2022-03-06 | Discharge: 2022-03-06 | Disposition: A | Discharge status: Home / Self Care | Payer: Medicare Other | Source: Ambulatory Visit | Attending: Radiation Oncology | Admitting: Radiation Oncology

## 2022-03-06 ENCOUNTER — Other Ambulatory Visit: Payer: Self-pay

## 2022-03-06 DIAGNOSIS — Z170 Estrogen receptor positive status [ER+]: Secondary | ICD-10-CM | POA: Diagnosis not present

## 2022-03-06 DIAGNOSIS — D0511 Intraductal carcinoma in situ of right breast: Secondary | ICD-10-CM | POA: Diagnosis not present

## 2022-03-06 DIAGNOSIS — Z510 Encounter for antineoplastic radiation therapy: Secondary | ICD-10-CM | POA: Diagnosis not present

## 2022-03-06 LAB — RAD ONC ARIA SESSION SUMMARY
Course Elapsed Days: 2
Plan Fractions Treated to Date: 3
Plan Prescribed Dose Per Fraction: 2.67 Gy
Plan Total Fractions Prescribed: 15
Plan Total Prescribed Dose: 40.05 Gy
Reference Point Dosage Given to Date: 8.01 Gy
Reference Point Session Dosage Given: 2.67 Gy
Session Number: 3

## 2022-03-09 ENCOUNTER — Ambulatory Visit
Admission: RE | Admit: 2022-03-09 | Discharge: 2022-03-09 | Disposition: A | Discharge status: Home / Self Care | Payer: Medicare Other | Source: Ambulatory Visit | Attending: Radiation Oncology | Admitting: Radiation Oncology

## 2022-03-09 ENCOUNTER — Other Ambulatory Visit: Payer: Self-pay

## 2022-03-09 DIAGNOSIS — Z170 Estrogen receptor positive status [ER+]: Secondary | ICD-10-CM | POA: Diagnosis not present

## 2022-03-09 DIAGNOSIS — D0511 Intraductal carcinoma in situ of right breast: Secondary | ICD-10-CM | POA: Diagnosis not present

## 2022-03-09 DIAGNOSIS — Z510 Encounter for antineoplastic radiation therapy: Secondary | ICD-10-CM | POA: Diagnosis not present

## 2022-03-09 LAB — RAD ONC ARIA SESSION SUMMARY
Course Elapsed Days: 5
Plan Fractions Treated to Date: 4
Plan Prescribed Dose Per Fraction: 2.67 Gy
Plan Total Fractions Prescribed: 15
Plan Total Prescribed Dose: 40.05 Gy
Reference Point Dosage Given to Date: 10.68 Gy
Reference Point Session Dosage Given: 2.67 Gy
Session Number: 4

## 2022-03-10 ENCOUNTER — Ambulatory Visit
Admission: RE | Admit: 2022-03-10 | Discharge: 2022-03-10 | Disposition: A | Discharge status: Home / Self Care | Payer: Medicare Other | Source: Ambulatory Visit | Attending: Radiation Oncology | Admitting: Radiation Oncology

## 2022-03-10 ENCOUNTER — Other Ambulatory Visit: Payer: Self-pay

## 2022-03-10 ENCOUNTER — Other Ambulatory Visit: Payer: Medicare Other

## 2022-03-10 DIAGNOSIS — Z510 Encounter for antineoplastic radiation therapy: Secondary | ICD-10-CM | POA: Diagnosis not present

## 2022-03-10 DIAGNOSIS — D0511 Intraductal carcinoma in situ of right breast: Secondary | ICD-10-CM

## 2022-03-10 DIAGNOSIS — Z170 Estrogen receptor positive status [ER+]: Secondary | ICD-10-CM | POA: Diagnosis not present

## 2022-03-10 LAB — RAD ONC ARIA SESSION SUMMARY
Course Elapsed Days: 6
Plan Fractions Treated to Date: 5
Plan Prescribed Dose Per Fraction: 2.67 Gy
Plan Total Fractions Prescribed: 15
Plan Total Prescribed Dose: 40.05 Gy
Reference Point Dosage Given to Date: 13.35 Gy
Reference Point Session Dosage Given: 2.67 Gy
Session Number: 5

## 2022-03-10 MED ORDER — ALRA NON-METALLIC DEODORANT (RAD-ONC)
1.0000 "application " | Freq: Once | TOPICAL | Status: AC
  Administered 2022-03-10: 1 via TOPICAL

## 2022-03-10 MED ORDER — RADIAPLEXRX EX GEL: Freq: Once | CUTANEOUS | Status: AC

## 2022-03-11 ENCOUNTER — Other Ambulatory Visit: Payer: Self-pay

## 2022-03-11 ENCOUNTER — Ambulatory Visit
Admission: RE | Admit: 2022-03-11 | Discharge: 2022-03-11 | Disposition: A | Discharge status: Home / Self Care | Payer: Medicare Other | Source: Ambulatory Visit | Attending: Radiation Oncology | Admitting: Radiation Oncology

## 2022-03-11 DIAGNOSIS — Z170 Estrogen receptor positive status [ER+]: Secondary | ICD-10-CM | POA: Diagnosis not present

## 2022-03-11 DIAGNOSIS — Z510 Encounter for antineoplastic radiation therapy: Secondary | ICD-10-CM | POA: Diagnosis not present

## 2022-03-11 DIAGNOSIS — D0511 Intraductal carcinoma in situ of right breast: Secondary | ICD-10-CM | POA: Diagnosis not present

## 2022-03-11 LAB — RAD ONC ARIA SESSION SUMMARY
Course Elapsed Days: 7
Plan Fractions Treated to Date: 6
Plan Prescribed Dose Per Fraction: 2.67 Gy
Plan Total Fractions Prescribed: 15
Plan Total Prescribed Dose: 40.05 Gy
Reference Point Dosage Given to Date: 16.02 Gy
Reference Point Session Dosage Given: 2.67 Gy
Session Number: 6

## 2022-03-12 ENCOUNTER — Ambulatory Visit
Admission: RE | Admit: 2022-03-12 | Discharge: 2022-03-12 | Disposition: A | Discharge status: Home / Self Care | Payer: Medicare Other | Source: Ambulatory Visit | Attending: Radiation Oncology | Admitting: Radiation Oncology

## 2022-03-12 ENCOUNTER — Other Ambulatory Visit: Payer: Self-pay

## 2022-03-12 DIAGNOSIS — D0511 Intraductal carcinoma in situ of right breast: Secondary | ICD-10-CM | POA: Diagnosis not present

## 2022-03-12 DIAGNOSIS — Z510 Encounter for antineoplastic radiation therapy: Secondary | ICD-10-CM | POA: Diagnosis not present

## 2022-03-12 DIAGNOSIS — Z170 Estrogen receptor positive status [ER+]: Secondary | ICD-10-CM | POA: Diagnosis not present

## 2022-03-12 LAB — RAD ONC ARIA SESSION SUMMARY
Course Elapsed Days: 8
Plan Fractions Treated to Date: 7
Plan Prescribed Dose Per Fraction: 2.67 Gy
Plan Total Fractions Prescribed: 15
Plan Total Prescribed Dose: 40.05 Gy
Reference Point Dosage Given to Date: 18.69 Gy
Reference Point Session Dosage Given: 2.67 Gy
Session Number: 7

## 2022-03-13 ENCOUNTER — Ambulatory Visit
Admission: RE | Admit: 2022-03-13 | Discharge: 2022-03-13 | Disposition: A | Discharge status: Home / Self Care | Payer: Medicare Other | Source: Ambulatory Visit | Attending: Radiation Oncology | Admitting: Radiation Oncology

## 2022-03-13 ENCOUNTER — Other Ambulatory Visit: Payer: Self-pay

## 2022-03-13 DIAGNOSIS — Z510 Encounter for antineoplastic radiation therapy: Secondary | ICD-10-CM | POA: Diagnosis not present

## 2022-03-13 DIAGNOSIS — D0511 Intraductal carcinoma in situ of right breast: Secondary | ICD-10-CM | POA: Diagnosis not present

## 2022-03-13 DIAGNOSIS — Z170 Estrogen receptor positive status [ER+]: Secondary | ICD-10-CM | POA: Diagnosis not present

## 2022-03-13 LAB — RAD ONC ARIA SESSION SUMMARY
Course Elapsed Days: 9
Plan Fractions Treated to Date: 8
Plan Prescribed Dose Per Fraction: 2.67 Gy
Plan Total Fractions Prescribed: 15
Plan Total Prescribed Dose: 40.05 Gy
Reference Point Dosage Given to Date: 21.36 Gy
Reference Point Session Dosage Given: 2.67 Gy
Session Number: 8

## 2022-03-16 ENCOUNTER — Ambulatory Visit
Admission: RE | Admit: 2022-03-16 | Discharge: 2022-03-16 | Disposition: A | Discharge status: Home / Self Care | Payer: Medicare Other | Source: Ambulatory Visit | Attending: Radiation Oncology | Admitting: Radiation Oncology

## 2022-03-16 ENCOUNTER — Other Ambulatory Visit: Payer: Self-pay

## 2022-03-16 DIAGNOSIS — Z170 Estrogen receptor positive status [ER+]: Secondary | ICD-10-CM | POA: Diagnosis not present

## 2022-03-16 DIAGNOSIS — D0511 Intraductal carcinoma in situ of right breast: Secondary | ICD-10-CM | POA: Diagnosis not present

## 2022-03-16 DIAGNOSIS — Z510 Encounter for antineoplastic radiation therapy: Secondary | ICD-10-CM | POA: Diagnosis not present

## 2022-03-16 LAB — RAD ONC ARIA SESSION SUMMARY
Course Elapsed Days: 12
Plan Fractions Treated to Date: 9
Plan Prescribed Dose Per Fraction: 2.67 Gy
Plan Total Fractions Prescribed: 15
Plan Total Prescribed Dose: 40.05 Gy
Reference Point Dosage Given to Date: 24.03 Gy
Reference Point Session Dosage Given: 2.67 Gy
Session Number: 9

## 2022-03-17 ENCOUNTER — Ambulatory Visit
Admission: RE | Admit: 2022-03-17 | Discharge: 2022-03-17 | Disposition: A | Discharge status: Home / Self Care | Payer: Medicare Other | Source: Ambulatory Visit | Attending: Radiation Oncology | Admitting: Radiation Oncology

## 2022-03-17 ENCOUNTER — Other Ambulatory Visit: Payer: Self-pay

## 2022-03-17 DIAGNOSIS — Z170 Estrogen receptor positive status [ER+]: Secondary | ICD-10-CM | POA: Diagnosis not present

## 2022-03-17 DIAGNOSIS — D0511 Intraductal carcinoma in situ of right breast: Secondary | ICD-10-CM | POA: Diagnosis not present

## 2022-03-17 DIAGNOSIS — Z510 Encounter for antineoplastic radiation therapy: Secondary | ICD-10-CM | POA: Diagnosis not present

## 2022-03-17 LAB — RAD ONC ARIA SESSION SUMMARY
Course Elapsed Days: 13
Plan Fractions Treated to Date: 10
Plan Prescribed Dose Per Fraction: 2.67 Gy
Plan Total Fractions Prescribed: 15
Plan Total Prescribed Dose: 40.05 Gy
Reference Point Dosage Given to Date: 26.7 Gy
Reference Point Session Dosage Given: 2.67 Gy
Session Number: 10

## 2022-03-17 NOTE — Progress Notes (Deleted)
[place] Phone: [phone] Subjective:    I'm seeing this patient by the request  of:  Felicia Olp, MD  CC:   [id number]  Felicia Perry is a 76 y.o. female coming in with complaint of back and neck pain. OMT 12/17/2021. Did go through [facility] series with Dr. Georgina Snell since February. Also had breast lumpectomy on 01/22/2022. Patient states   Medications patient has been prescribed: Effexor  Taking:         Reviewed prior external information including notes and imaging from previsou exam, outside providers and external EMR if available.   As well as notes that were available from care everywhere and other healthcare systems.  Past medical history, social, surgical and family history all reviewed in electronic medical record.  No pertanent information unless stated regarding to the chief complaint.   Past Medical History:  Diagnosis Date   Adenomatous polyp 11/23/2005   Anxiety    01/09/22- patient denies anxiety   Asthma 2003   Basal cell carcinoma 2021   right cheek per patient at [place] [facility]   Carotid stenosis    Chronic diastolic CHF (congestive heart failure) ([place]) 2017   Echo 2021 was normal   COPD (chronic obstructive pulmonary disease) ([place])    Coronary artery disease 2017   a. HC on 08/14/16 showed RCA CTO s/p PCI, 60% LCX disease managed medically with recs to consider staged PCI if continued symptoms.    Depression 2008   pt reports MD diagnosed but she does not feel depressed   Diabetes mellitus without complication ([place])    DIVERTICULOSIS, COLON 04/22/2007        GERD (gastroesophageal reflux disease) 2012   History of shingles    Hypertension 2008   Hypertensive heart disease    Insomnia 2003   Internal hemorrhoids    Osteoarthritis 2008   "knees, hands, lower back" (08/14/2016)   Pap smear for cervical cancer screening 2002   results normal   PUD (peptic  ulcer disease) 1989   Seasonal allergies 2013   Sleep apnea    a. resolved with weight loss    Allergies  Allergen Reactions   Celexa [Citalopram Hydrobromide]     psychosis   Cephalosporins     REACTION: tongue swelling   Clarithromycin     REACTION: blisters in mouth   Doxycycline Hyclate     REACTION: nausea, vomiting   Levofloxacin     REACTION: tongue swells   Penicillins     REACTION: per patient causes rash,hives   Rosuvastatin Other (See Comments)    Pt reports causes bilateral lower extremity muscle aches.    Zolpidem Tartrate     REACTION: difficulty with concentration     Review of Systems:  No headache, visual changes, nausea, vomiting, diarrhea, constipation, dizziness, abdominal pain, skin rash, fevers, chills, night sweats, weight loss, swollen lymph nodes, body aches, joint swelling, chest pain, shortness of breath, mood changes. POSITIVE muscle aches  Objective  There were no vitals taken for this visit.   General: No apparent distress alert and oriented x3 mood and affect normal, dressed appropriately.  HEENT: Pupils equal, extraocular movements intact  Respiratory: Patient's speak in full sentences and does not appear short of breath  Cardiovascular: No lower extremity edema, non tender, no erythema  Neuro: Cranial nerves II through XII are intact, neurovascularly intact in all extremities with 2+ DTRs and 2+ pulses.  Gait normal with  good balance and coordination.  MSK:  Non tender with full range of motion and good stability and symmetric strength and tone of shoulders, elbows, wrist, hip, knee and ankles bilaterally.  Back - Normal skin, Spine with normal alignment and no deformity.  No tenderness to vertebral process palpation.  Paraspinous muscles are not tender and without spasm.   Range of motion is full at neck and lumbar sacral regions  Osteopathic findings  C2 flexed rotated and side bent right C6 flexed rotated and side bent left T3 extended  rotated and side bent right inhaled rib T9 extended rotated and side bent left L2 flexed rotated and side bent right Sacrum right on right       Assessment and Plan:    Nonallopathic problems  Decision today to treat with OMT was based on Physical Exam  After verbal consent patient was treated with HVLA, ME, FPR techniques in cervical, rib, thoracic, lumbar, and sacral  areas  Patient tolerated the procedure well with improvement in symptoms  Patient given exercises, stretches and lifestyle modifications  See medications in patient instructions if given  Patient will follow up in 4-8 weeks      The above documentation has been reviewed and is accurate and complete [name]       Note: This dictation was prepared with Dragon dictation along with smaller phrase technology. Any transcriptional errors that result from this process are unintentional.

## 2022-03-18 ENCOUNTER — Ambulatory Visit: Payer: Medicare Other | Admitting: Family Medicine

## 2022-03-18 ENCOUNTER — Ambulatory Visit
Admission: RE | Admit: 2022-03-18 | Discharge: 2022-03-18 | Disposition: A | Discharge status: Home / Self Care | Payer: Medicare Other | Source: Ambulatory Visit | Attending: Radiation Oncology | Admitting: Radiation Oncology

## 2022-03-18 ENCOUNTER — Other Ambulatory Visit: Payer: Self-pay

## 2022-03-18 DIAGNOSIS — D0511 Intraductal carcinoma in situ of right breast: Secondary | ICD-10-CM | POA: Diagnosis not present

## 2022-03-18 DIAGNOSIS — Z170 Estrogen receptor positive status [ER+]: Secondary | ICD-10-CM | POA: Diagnosis not present

## 2022-03-18 DIAGNOSIS — Z510 Encounter for antineoplastic radiation therapy: Secondary | ICD-10-CM | POA: Diagnosis not present

## 2022-03-18 LAB — RAD ONC ARIA SESSION SUMMARY
Course Elapsed Days: 14
Plan Fractions Treated to Date: 11
Plan Prescribed Dose Per Fraction: 2.67 Gy
Plan Total Fractions Prescribed: 15
Plan Total Prescribed Dose: 40.05 Gy
Reference Point Dosage Given to Date: 29.37 Gy
Reference Point Session Dosage Given: 2.67 Gy
Session Number: 11

## 2022-03-19 ENCOUNTER — Ambulatory Visit
Admission: RE | Admit: 2022-03-19 | Discharge: 2022-03-19 | Disposition: A | Discharge status: Home / Self Care | Payer: Medicare Other | Source: Ambulatory Visit | Attending: Radiation Oncology | Admitting: Radiation Oncology

## 2022-03-19 ENCOUNTER — Other Ambulatory Visit: Payer: Self-pay

## 2022-03-19 DIAGNOSIS — D0511 Intraductal carcinoma in situ of right breast: Secondary | ICD-10-CM | POA: Diagnosis not present

## 2022-03-19 DIAGNOSIS — Z510 Encounter for antineoplastic radiation therapy: Secondary | ICD-10-CM | POA: Diagnosis not present

## 2022-03-19 DIAGNOSIS — Z170 Estrogen receptor positive status [ER+]: Secondary | ICD-10-CM | POA: Diagnosis not present

## 2022-03-19 LAB — RAD ONC ARIA SESSION SUMMARY
Course Elapsed Days: 15
Plan Fractions Treated to Date: 12
Plan Prescribed Dose Per Fraction: 2.67 Gy
Plan Total Fractions Prescribed: 15
Plan Total Prescribed Dose: 40.05 Gy
Reference Point Dosage Given to Date: 32.04 Gy
Reference Point Session Dosage Given: 2.67 Gy
Session Number: 12

## 2022-03-20 ENCOUNTER — Ambulatory Visit
Admission: RE | Admit: 2022-03-20 | Discharge: 2022-03-20 | Disposition: A | Discharge status: Home / Self Care | Payer: Medicare Other | Source: Ambulatory Visit | Attending: Radiation Oncology | Admitting: Radiation Oncology

## 2022-03-20 ENCOUNTER — Other Ambulatory Visit: Payer: Self-pay

## 2022-03-20 DIAGNOSIS — D0511 Intraductal carcinoma in situ of right breast: Secondary | ICD-10-CM | POA: Diagnosis not present

## 2022-03-20 DIAGNOSIS — Z510 Encounter for antineoplastic radiation therapy: Secondary | ICD-10-CM | POA: Diagnosis not present

## 2022-03-20 DIAGNOSIS — Z170 Estrogen receptor positive status [ER+]: Secondary | ICD-10-CM | POA: Diagnosis not present

## 2022-03-20 LAB — RAD ONC ARIA SESSION SUMMARY
Course Elapsed Days: 16
Plan Fractions Treated to Date: 13
Plan Prescribed Dose Per Fraction: 2.67 Gy
Plan Total Fractions Prescribed: 15
Plan Total Prescribed Dose: 40.05 Gy
Reference Point Dosage Given to Date: 34.71 Gy
Reference Point Session Dosage Given: 2.67 Gy
Session Number: 13

## 2022-03-24 ENCOUNTER — Ambulatory Visit
Admission: RE | Admit: 2022-03-24 | Discharge: 2022-03-24 | Disposition: A | Discharge status: Home / Self Care | Payer: Medicare Other | Source: Ambulatory Visit | Attending: Radiation Oncology | Admitting: Radiation Oncology

## 2022-03-24 ENCOUNTER — Ambulatory Visit: Payer: Medicare Other | Admitting: Radiation Oncology

## 2022-03-24 ENCOUNTER — Other Ambulatory Visit: Payer: Self-pay

## 2022-03-24 DIAGNOSIS — D0511 Intraductal carcinoma in situ of right breast: Secondary | ICD-10-CM | POA: Diagnosis not present

## 2022-03-24 DIAGNOSIS — Z510 Encounter for antineoplastic radiation therapy: Secondary | ICD-10-CM | POA: Diagnosis not present

## 2022-03-24 DIAGNOSIS — Z170 Estrogen receptor positive status [ER+]: Secondary | ICD-10-CM | POA: Diagnosis not present

## 2022-03-24 LAB — RAD ONC ARIA SESSION SUMMARY
Course Elapsed Days: 20
Plan Fractions Treated to Date: 14
Plan Prescribed Dose Per Fraction: 2.67 Gy
Plan Total Fractions Prescribed: 15
Plan Total Prescribed Dose: 40.05 Gy
Reference Point Dosage Given to Date: 37.38 Gy
Reference Point Session Dosage Given: 2.67 Gy
Session Number: 14

## 2022-03-25 ENCOUNTER — Ambulatory Visit
Admission: RE | Admit: 2022-03-25 | Discharge: 2022-03-25 | Disposition: A | Discharge status: Home / Self Care | Payer: Medicare Other | Source: Ambulatory Visit | Attending: Radiation Oncology | Admitting: Radiation Oncology

## 2022-03-25 ENCOUNTER — Other Ambulatory Visit: Payer: Self-pay

## 2022-03-25 DIAGNOSIS — D0511 Intraductal carcinoma in situ of right breast: Secondary | ICD-10-CM | POA: Diagnosis not present

## 2022-03-25 DIAGNOSIS — Z170 Estrogen receptor positive status [ER+]: Secondary | ICD-10-CM | POA: Diagnosis not present

## 2022-03-25 DIAGNOSIS — Z510 Encounter for antineoplastic radiation therapy: Secondary | ICD-10-CM | POA: Diagnosis not present

## 2022-03-25 LAB — RAD ONC ARIA SESSION SUMMARY
Course Elapsed Days: 21
Plan Fractions Treated to Date: 15
Plan Prescribed Dose Per Fraction: 2.67 Gy
Plan Total Fractions Prescribed: 15
Plan Total Prescribed Dose: 40.05 Gy
Reference Point Dosage Given to Date: 40.05 Gy
Reference Point Session Dosage Given: 2.67 Gy
Session Number: 15

## 2022-03-26 ENCOUNTER — Other Ambulatory Visit: Payer: Self-pay

## 2022-03-26 ENCOUNTER — Ambulatory Visit
Admission: RE | Admit: 2022-03-26 | Discharge: 2022-03-26 | Disposition: A | Discharge status: Home / Self Care | Payer: Medicare Other | Source: Ambulatory Visit | Attending: Radiation Oncology | Admitting: Radiation Oncology

## 2022-03-26 DIAGNOSIS — D0511 Intraductal carcinoma in situ of right breast: Secondary | ICD-10-CM | POA: Diagnosis not present

## 2022-03-26 DIAGNOSIS — Z510 Encounter for antineoplastic radiation therapy: Secondary | ICD-10-CM | POA: Insufficient documentation

## 2022-03-26 DIAGNOSIS — Z170 Estrogen receptor positive status [ER+]: Secondary | ICD-10-CM | POA: Diagnosis not present

## 2022-03-26 LAB — RAD ONC ARIA SESSION SUMMARY
Course Elapsed Days: 22
Plan Fractions Treated to Date: 1
Plan Prescribed Dose Per Fraction: 2 Gy
Plan Total Fractions Prescribed: 6
Plan Total Prescribed Dose: 12 Gy
Reference Point Dosage Given to Date: 42.05 Gy
Reference Point Session Dosage Given: 2 Gy
Session Number: 16

## 2022-03-27 ENCOUNTER — Ambulatory Visit
Admission: RE | Admit: 2022-03-27 | Discharge: 2022-03-27 | Disposition: A | Discharge status: Home / Self Care | Payer: Medicare Other | Source: Ambulatory Visit | Attending: Radiation Oncology | Admitting: Radiation Oncology

## 2022-03-27 ENCOUNTER — Other Ambulatory Visit: Payer: Self-pay

## 2022-03-27 ENCOUNTER — Other Ambulatory Visit: Payer: Self-pay | Admitting: Family Medicine

## 2022-03-27 DIAGNOSIS — Z510 Encounter for antineoplastic radiation therapy: Secondary | ICD-10-CM | POA: Diagnosis not present

## 2022-03-27 DIAGNOSIS — D0511 Intraductal carcinoma in situ of right breast: Secondary | ICD-10-CM | POA: Diagnosis not present

## 2022-03-27 LAB — RAD ONC ARIA SESSION SUMMARY
Course Elapsed Days: 23
Plan Fractions Treated to Date: 2
Plan Prescribed Dose Per Fraction: 2 Gy
Plan Total Fractions Prescribed: 6
Plan Total Prescribed Dose: 12 Gy
Reference Point Dosage Given to Date: 44.05 Gy
Reference Point Session Dosage Given: 2 Gy
Session Number: 17

## 2022-03-27 MED ORDER — CARVEDILOL 6.25 MG PO TABS: 6.2500 mg | ORAL_TABLET | Freq: Two times a day (BID) | ORAL | 3 refills | Status: DC

## 2022-03-30 ENCOUNTER — Other Ambulatory Visit: Payer: Self-pay

## 2022-03-30 ENCOUNTER — Ambulatory Visit
Admission: RE | Admit: 2022-03-30 | Discharge: 2022-03-30 | Disposition: A | Discharge status: Home / Self Care | Payer: Medicare Other | Source: Ambulatory Visit | Attending: Radiation Oncology | Admitting: Radiation Oncology

## 2022-03-30 DIAGNOSIS — Z510 Encounter for antineoplastic radiation therapy: Secondary | ICD-10-CM | POA: Diagnosis not present

## 2022-03-30 DIAGNOSIS — D0511 Intraductal carcinoma in situ of right breast: Secondary | ICD-10-CM | POA: Diagnosis not present

## 2022-03-30 LAB — RAD ONC ARIA SESSION SUMMARY
Course Elapsed Days: 26
Plan Fractions Treated to Date: 3
Plan Prescribed Dose Per Fraction: 2 Gy
Plan Total Fractions Prescribed: 6
Plan Total Prescribed Dose: 12 Gy
Reference Point Dosage Given to Date: 46.05 Gy
Reference Point Session Dosage Given: 2 Gy
Session Number: 18

## 2022-03-31 ENCOUNTER — Ambulatory Visit: Payer: Medicare Other

## 2022-03-31 ENCOUNTER — Ambulatory Visit
Admission: RE | Admit: 2022-03-31 | Discharge: 2022-03-31 | Disposition: A | Discharge status: Home / Self Care | Payer: Medicare Other | Source: Ambulatory Visit | Attending: Radiation Oncology | Admitting: Radiation Oncology

## 2022-03-31 ENCOUNTER — Other Ambulatory Visit: Payer: Self-pay

## 2022-03-31 DIAGNOSIS — Z510 Encounter for antineoplastic radiation therapy: Secondary | ICD-10-CM | POA: Diagnosis not present

## 2022-03-31 DIAGNOSIS — D0511 Intraductal carcinoma in situ of right breast: Secondary | ICD-10-CM | POA: Diagnosis not present

## 2022-03-31 LAB — RAD ONC ARIA SESSION SUMMARY
Course Elapsed Days: 27
Plan Fractions Treated to Date: 4
Plan Prescribed Dose Per Fraction: 2 Gy
Plan Total Fractions Prescribed: 6
Plan Total Prescribed Dose: 12 Gy
Reference Point Dosage Given to Date: 48.05 Gy
Reference Point Session Dosage Given: 2 Gy
Session Number: 19

## 2022-04-01 ENCOUNTER — Encounter: Payer: Self-pay | Admitting: *Deleted

## 2022-04-01 ENCOUNTER — Ambulatory Visit
Admission: RE | Admit: 2022-04-01 | Discharge: 2022-04-01 | Disposition: A | Discharge status: Home / Self Care | Payer: Medicare Other | Source: Ambulatory Visit | Attending: Radiation Oncology | Admitting: Radiation Oncology

## 2022-04-01 ENCOUNTER — Inpatient Hospital Stay: Payer: Medicare Other | Attending: Hematology and Oncology | Admitting: Hematology and Oncology

## 2022-04-01 ENCOUNTER — Encounter: Payer: Self-pay | Admitting: Hematology and Oncology

## 2022-04-01 ENCOUNTER — Other Ambulatory Visit: Payer: Self-pay

## 2022-04-01 DIAGNOSIS — R5383 Other fatigue: Secondary | ICD-10-CM | POA: Diagnosis not present

## 2022-04-01 DIAGNOSIS — Z79899 Other long term (current) drug therapy: Secondary | ICD-10-CM | POA: Insufficient documentation

## 2022-04-01 DIAGNOSIS — Z87891 Personal history of nicotine dependence: Secondary | ICD-10-CM | POA: Insufficient documentation

## 2022-04-01 DIAGNOSIS — Z79811 Long term (current) use of aromatase inhibitors: Secondary | ICD-10-CM | POA: Insufficient documentation

## 2022-04-01 DIAGNOSIS — Z510 Encounter for antineoplastic radiation therapy: Secondary | ICD-10-CM | POA: Diagnosis not present

## 2022-04-01 DIAGNOSIS — R110 Nausea: Secondary | ICD-10-CM | POA: Insufficient documentation

## 2022-04-01 DIAGNOSIS — D0511 Intraductal carcinoma in situ of right breast: Secondary | ICD-10-CM | POA: Diagnosis not present

## 2022-04-01 DIAGNOSIS — R21 Rash and other nonspecific skin eruption: Secondary | ICD-10-CM | POA: Insufficient documentation

## 2022-04-01 DIAGNOSIS — Z170 Estrogen receptor positive status [ER+]: Secondary | ICD-10-CM | POA: Diagnosis not present

## 2022-04-01 LAB — RAD ONC ARIA SESSION SUMMARY
Course Elapsed Days: 28
Plan Fractions Treated to Date: 5
Plan Prescribed Dose Per Fraction: 2 Gy
Plan Total Fractions Prescribed: 6
Plan Total Prescribed Dose: 12 Gy
Reference Point Dosage Given to Date: 50.05 Gy
Reference Point Session Dosage Given: 2 Gy
Session Number: 20

## 2022-04-01 NOTE — Assessment & Plan Note (Addendum)
This is a very pleasant 76 year old female patient with right breast DCIS referred to breast [place] for additional recommendation.  She is now status postlumpectomy and final pathology showed 2 mm invasive ductal carcinoma, grade 1, free margins, ER +85%, PR negative, HER2 negative as well as a grade 2 intermediate DCIS.  She is now undergoing adjuvant radiation. She denies any major complications.  She has some fatigue and slept most of the week and past weekend and has noticed some skin rash which is tolerable.  She has also been taking anastrozole concomitantly.  She has been tolerating this well except for mild nausea.  She denies any hot flashes.  She is willing to return to clinic in 6 months for follow-up since she has been tolerating the medication well.  She was however encouraged to contact us with any new questions or concerns.  She will be due for a mammogram in February 2024.  I have also ordered baseline bone density, this has not been scheduled yet.  I have also given the phone number to call for the patient to schedule it.

## 2022-04-01 NOTE — Progress Notes (Signed)
[place] NOTE  Patient Care Team: Marin Olp, MD as PCP - General (Family Medicine) [name], MD as PCP - Cardiology (Cardiology) [name], DO as Consulting Physician (Sports Medicine) Warren Danes, PA-C as Physician Assistant (Dermatology) Edythe Clarity, [facility] as Pharmacist (Pharmacist) Mauro Kaufmann, RN as Oncology Nurse Navigator Rockwell Germany, RN as Oncology Nurse Navigator Erroll Luna, MD as Consulting Physician (General Surgery) [name], MD as Consulting Physician (Hematology and Oncology) Gery Pray, MD as Consulting Physician (Radiation Oncology)  CHIEF COMPLAINTS/PURPOSE OF CONSULTATION:  Breast cancer follow up.  HISTORY OF PRESENTING ILLNESS:  Felicia Perry 76 y.o. female is here because of recent diagnosis of right breast DCIS  12/02/2021, mammogram showed possible mass in the right breast.  In the left breast no findings suspicious for malignancy 12/25/2021 diagnostic mammogram showed indeterminate right breast mass at 930 o'clock. 12/30/2021, ultrasound-guided biopsy of the breast mass showed ductal carcinoma in situ, ER 100%, positive, strong staining, PR 100%, positive, strong staining Patient is presented in breast [place] for additional recommendations.  Felicia Perry arrived to the appointment today with her daughter-in-law.  Felicia Perry denies any palpable abnormalities in the breast or prior diagnosis of malignancy. Felicia Perry has past medical history of borderline diabetes, coronary artery disease status post 3 stents back in 2017.  Felicia Perry had fatty tumor removed from the breast a while ago.  No family history of any malignancy. I reviewed her records extensively and collaborated the history with the patient.  SUMMARY OF ONCOLOGIC HISTORY: Oncology History  Ductal carcinoma in situ (DCIS) of right breast  12/18/2021 Cancer Staging   Staging form: Breast, AJCC 8th Edition - Clinical stage from 12/18/2021: Stage 0  (cTis (DCIS), cN0, cM0, ER+, PR+, HER2: Not Assessed) - Signed by [name], MD on 01/07/2022 Stage prefix: Initial diagnosis Nuclear grade: G2 Laterality: Right Stage used in treatment planning: Yes National guidelines used in treatment planning: Yes Type of national guideline used in treatment planning: NCCN    01/05/2022 Initial Diagnosis   Ductal carcinoma in situ (DCIS) of right breast     Genetic Testing   [facility] CancerNext Panel was Negative. Report date is 01/17/2022.  The CancerNext gene panel offered by [employer] includes sequencing, rearrangement analysis, and RNA analysis for the following 36 genes:   APC, ATM, AXIN2, BARD1, BMPR1A, BRCA1, BRCA2, BRIP1, CDH1, CDK4, CDKN2A, CHEK2, DICER1, HOXB13, EPCAM, GREM1, MLH1, MSH2, MSH3, MSH6, MUTYH, NBN, NF1, NTHL1, PALB2, PMS2, POLD1, POLE, PTEN, RAD51C, RAD51D, RECQL, SMAD4, SMARCA4, STK11, and TP53.    01/22/2022 Surgery   Felicia Perry had right breast lumpectomy pathology showed invasive well-differentiated grade 1 ductal carcinoma, extensive intermediate grade DCIS.  Invasive tumor measured 2 mm in greatest dimension, margins free prognostics on the invasive tumor showed ER +85% moderate staining, progesterone receptor negative, KI of less than 5% and HER2 negative. Oncotype not needed since her tumor is less than 5 mm    Radiation Therapy   Felicia Perry is undergoing adjuvant radiation.    Felicia Perry is undergoing adjuvant radiation, 2 more days left. Felicia Perry is experiencing fatigue from radiation. No pain. Skin rash as expected. Felicia Perry has been taking anastrazole every day, tolerating it well. Started April 2023. No hot flashes. Bone density has not been scheduled yet.  MEDICAL HISTORY:  Past Medical History:  Diagnosis Date   Adenomatous polyp 11/23/2005   Anxiety    01/09/22- patient denies anxiety   Asthma 2003   Basal cell carcinoma 2021  right cheek per patient at [place] [facility]   Carotid stenosis    Chronic diastolic CHF (congestive  heart failure) ([place]) 2017   Echo 2021 was normal   COPD (chronic obstructive pulmonary disease) ([place])    Coronary artery disease 2017   a. HC on 08/14/16 showed RCA CTO s/p PCI, 60% LCX disease managed medically with recs to consider staged PCI if continued symptoms.    Depression 2008   pt reports MD diagnosed but Felicia Perry does not feel depressed   Diabetes mellitus without complication ([place])    DIVERTICULOSIS, COLON 04/22/2007        GERD (gastroesophageal reflux disease) 2012   History of shingles    Hypertension 2008   Hypertensive heart disease    Insomnia 2003   Internal hemorrhoids    Osteoarthritis 2008   "knees, hands, lower back" (08/14/2016)   Pap smear for cervical cancer screening 2002   results normal   PUD (peptic ulcer disease) 1989   Seasonal allergies 2013   Sleep apnea    a. resolved with weight loss    SURGICAL HISTORY: Past Surgical History:  Procedure Laterality Date   BREAST BIOPSY Right 12/30/2021   BREAST EXCISIONAL BIOPSY Left    1980's x 3 - all benign   BREAST EXCISIONAL BIOPSY Right    1980's x 1 or 2 - all benign   BREAST LUMPECTOMY WITH RADIOACTIVE SEED LOCALIZATION Right 01/22/2022   Procedure: RIGHT BREAST LUMPECTOMY WITH RADIOACTIVE SEED LOCALIZATION;  Surgeon: Erroll Luna, MD;  Location: [facility];  Service: General;  Laterality: Right;   BREAST SURGERY Left 1980s   Fibrous Tumors removed    CARDIAC CATHETERIZATION N/A 08/14/2016   Procedure: Left Heart Cath and Coronary Angiography;  Surgeon: Sherren Mocha, MD;  Location: [facility];  Service: Cardiovascular;  Laterality: N/A;   CARDIAC CATHETERIZATION N/A 08/14/2016   Procedure: Coronary Stent Intervention;  Surgeon: Sherren Mocha, MD;  Location: [facility];  Service: Cardiovascular;  Laterality: N/A;   CATARACT EXTRACTION W/ INTRAOCULAR LENS  IMPLANT, BILATERAL Bilateral 05/2009   COLONOSCOPY N/A 2016   Cologuard 2019   CORONARY ANGIOPLASTY      detached retina left eye     may 2019   TUBAL LIGATION  1970s    SOCIAL HISTORY: Social History   Socioeconomic History   Marital status: Divorced    Spouse name: Not on file   Number of children: Not on file   Years of education: Not on file   Highest education level: Not on file  Occupational History   Occupation: Cleaning  Tobacco Use   Smoking status: Former    Packs/day: 1.00    Years: 26.00    Pack years: 26.00    Types: Cigarettes    Quit date: 1989    Years since quitting: 34.4   Smokeless tobacco: Never  Vaping Use   Vaping Use: Never used  Substance and Sexual Activity   Alcohol use: No   Drug use: No   Sexual activity: Not Currently  Other Topics Concern   Not on file  Social History Narrative   Divorced for 60 years in 2016. 2 sons- 1 son had been in prison now living with her in 2019.  2 granddaughters with 1 living close.        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 Best friend [name] patient of Dr. Yong Channel. Enjoys work, time on Teaching laboratory technician.    Social Determinants of Health   Financial Resource Strain: Low Risk    Difficulty of Paying Living Expenses: Not very hard  Food Insecurity: No Food Insecurity   Worried About Charity fundraiser in the Last Year: Never true   Ran Out of Food in the Last Year: Never true  Transportation Needs: No Transportation Needs   Lack of Transportation (Medical): No   Lack of Transportation (Non-Medical): No  Physical Activity: Sufficiently Active   Days of Exercise per Week: 2 days   Minutes of Exercise per Session: 120 min  Stress: Stress Concern Present   Feeling of Stress : To some extent  Social Connections: Moderately Isolated   Frequency of Communication with Friends and Family: More than three times a week   Frequency of Social Gatherings with Friends and Family: More than  three times a week   Attends Religious Services: More than 4 times per year   Active Member of [employer] or Organizations: No   Attends Music therapist: Never   Marital Status: Divorced  Human resources officer Violence: Not At Risk   Fear of Current or Ex-Partner: No   Emotionally Abused: No   Physically Abused: No   Sexually Abused: No    FAMILY HISTORY: Family History  Problem Relation Age of Onset   Heart disease Mother    Hypertension Mother    Diabetes Mother    Dementia Mother    COPD Father    Dementia Maternal Grandmother    Colon cancer Neg Hx     ALLERGIES:  is allergic to celexa [citalopram hydrobromide], cephalosporins, clarithromycin, doxycycline hyclate, levofloxacin, penicillins, rosuvastatin, and zolpidem tartrate.  MEDICATIONS:  Current Outpatient Medications  Medication Sig Dispense Refill   acetaminophen (TYLENOL) 325 MG tablet Take 650 mg by mouth every 6 (six) hours as needed.     albuterol (VENTOLIN HFA) 108 (90 Base) MCG/ACT inhaler Inhale 2 puffs into the lungs every 6 (six) hours as needed for wheezing or shortness of breath. 18 g 5   ALPRAZolam (XANAX) 1 MG tablet TAKE ONE TABLET AT BEDTIME AS NEEDED FOR SLEEP -DO NOT DRIVE FOR 8 HOURS AFTER TAKING 90 tablet 1   amLODipine (NORVASC) 10 MG tablet Take 1 tablet (10 mg total) by mouth daily. 90 tablet 3   anastrozole (ARIMIDEX) 1 MG tablet Take 1 tablet (1 mg total) by mouth daily. 30 tablet 3   aspirin EC 81 MG tablet Take 1 tablet (81 mg total) by mouth daily. Swallow whole. 90 tablet 3   Bempedoic Acid-Ezetimibe (NEXLIZET) 180-10 MG TABS Take 180 mg by mouth daily. 90 tablet 3   budesonide-formoterol (SYMBICORT) 160-4.5 MCG/ACT inhaler Inhale 2 puffs into the lungs 2 (two) times daily. 1 each 11   carvedilol (COREG) 6.25 MG tablet Take 1 tablet (6.25 mg total) by mouth 2 (two) times daily. 180 tablet 3   famotidine (PEPCID) 20 MG tablet TAKE ONE TABLET TWICE DAILY 60 tablet 5   irbesartan (AVAPRO)  300 MG tablet Take 1 tablet (300 mg total) by mouth daily. 90 tablet 2   levocetirizine (XYZAL) 5 MG tablet TAKE ONE TABLET EVERY MORNING 90 tablet 0   nitroGLYCERIN (NITROSTAT) 0.4 MG SL tablet Place 1 tablet (0.4 mg total) under the tongue every 5 (five) minutes as needed for chest pain. 25 tablet 6   omega-3 acid ethyl esters (LOVAZA) 1 g capsule  TAKE TWO CAPSULES EVERY DAY 180 capsule 3   oxyCODONE (OXY IR/ROXICODONE) 5 MG immediate release tablet Take 1 tablet (5 mg total) by mouth every 6 (six) hours as needed for severe pain. (Patient not taking: Reported on 02/23/2022) 15 tablet 0   venlafaxine XR (EFFEXOR-XR) 75 MG 24 hr capsule TAKE ONE CAPSULE EACH DAY WITH BREAKFAST 90 capsule 2   No current facility-administered medications for this visit.    PHYSICAL EXAMINATION: ECOG PERFORMANCE STATUS: 0 - Asymptomatic  Vitals:   04/01/22 1013  BP: (!) 149/73  Pulse: 82  Resp: 18  Temp: (!) 97.3 F (36.3 C)  SpO2: 99%    Filed Weights   04/01/22 1013  Weight: 164 lb 8 oz (74.6 kg)     Physical Exam Constitutional:      Appearance: Normal appearance.  Chest:     Comments: Right breast with post rad changes. No ulceration Neurological:     Mental Status: Felicia Perry is alert.     LABORATORY DATA:  I have reviewed the data as listed Lab Results  Component Value Date   WBC 14.5 (H) 01/07/2022   HGB 14.0 01/07/2022   HCT 40.7 01/07/2022   MCV 87.9 01/07/2022   PLT 359 01/07/2022   Lab Results  Component Value Date   NA 140 01/07/2022   K 4.1 01/07/2022   CL 103 01/07/2022   CO2 32 01/07/2022   I reviewed pertinent pathology report and imaging in the breast [place].  RADIOGRAPHIC STUDIES: I have personally reviewed the radiological reports and agreed with the findings in the report.  ASSESSMENT AND PLAN:   Ductal carcinoma in situ (DCIS) of right breast This is a very pleasant 76 year old female patient with right breast DCIS referred to breast [place] for additional  recommendation.  Felicia Perry is now status postlumpectomy and final pathology showed 2 mm invasive ductal carcinoma, grade 1, free margins, ER +85%, PR negative, HER2 negative as well as a grade 2 intermediate DCIS.  Felicia Perry is now undergoing adjuvant radiation. Felicia Perry denies any major complications.  Felicia Perry has some fatigue and slept most of the week and past weekend and has noticed some skin rash which is tolerable.  Felicia Perry has also been taking anastrozole concomitantly.  Felicia Perry has been tolerating this well except for mild nausea.  Felicia Perry denies any hot flashes.  Felicia Perry is willing to return to clinic in 6 months for follow-up since Felicia Perry has been tolerating the medication well.  Felicia Perry was however encouraged to contact us with any new questions or concerns.  Felicia Perry will be due for a mammogram in February 2024.  I have also ordered baseline bone density, this has not been scheduled yet.  I have also given the phone number to call for the patient to schedule it.  All questions were answered. The patient knows to call the clinic with any problems, questions or concerns.    [name], MD 04/01/22

## 2022-04-02 ENCOUNTER — Other Ambulatory Visit: Payer: Self-pay

## 2022-04-02 ENCOUNTER — Encounter: Payer: Self-pay | Admitting: Radiation Oncology

## 2022-04-02 ENCOUNTER — Ambulatory Visit
Admission: RE | Admit: 2022-04-02 | Discharge: 2022-04-02 | Disposition: A | Discharge status: Home / Self Care | Payer: Medicare Other | Source: Ambulatory Visit | Attending: Radiation Oncology | Admitting: Radiation Oncology

## 2022-04-02 DIAGNOSIS — D0511 Intraductal carcinoma in situ of right breast: Secondary | ICD-10-CM | POA: Diagnosis not present

## 2022-04-02 DIAGNOSIS — Z510 Encounter for antineoplastic radiation therapy: Secondary | ICD-10-CM | POA: Diagnosis not present

## 2022-04-02 LAB — RAD ONC ARIA SESSION SUMMARY
Course Elapsed Days: 29
Plan Fractions Treated to Date: 6
Plan Prescribed Dose Per Fraction: 2 Gy
Plan Total Fractions Prescribed: 6
Plan Total Prescribed Dose: 12 Gy
Reference Point Dosage Given to Date: 52.05 Gy
Reference Point Session Dosage Given: 2 Gy
Session Number: 21

## 2022-04-08 ENCOUNTER — Ambulatory Visit (INDEPENDENT_AMBULATORY_CARE_PROVIDER_SITE_OTHER): Payer: Medicare Other | Admitting: Family Medicine

## 2022-04-08 ENCOUNTER — Encounter: Payer: Self-pay | Admitting: Family Medicine

## 2022-04-08 VITALS — BP 132/68 | HR 79 | Temp 98.0°F | Ht 60.0 in | Wt 162.0 lb

## 2022-04-08 DIAGNOSIS — I5032 Chronic diastolic (congestive) heart failure: Secondary | ICD-10-CM

## 2022-04-08 DIAGNOSIS — D692 Other nonthrombocytopenic purpura: Secondary | ICD-10-CM

## 2022-04-08 DIAGNOSIS — E785 Hyperlipidemia, unspecified: Secondary | ICD-10-CM

## 2022-04-08 DIAGNOSIS — I10 Essential (primary) hypertension: Secondary | ICD-10-CM

## 2022-04-08 DIAGNOSIS — E118 Type 2 diabetes mellitus with unspecified complications: Secondary | ICD-10-CM

## 2022-04-08 LAB — CBC WITH DIFFERENTIAL/PLATELET
Basophils Absolute: 0.1 10*3/uL (ref 0.0–0.1)
Basophils Relative: 1.2 % (ref 0.0–3.0)
Eosinophils Absolute: 0.3 10*3/uL (ref 0.0–0.7)
Eosinophils Relative: 3.8 % (ref 0.0–5.0)
HCT: 42 % (ref 36.0–46.0)
Hemoglobin: 14.1 g/dL (ref 12.0–15.0)
Lymphocytes Relative: 14 % (ref 12.0–46.0)
Lymphs Abs: 1.2 10*3/uL (ref 0.7–4.0)
MCHC: 33.6 g/dL (ref 30.0–36.0)
MCV: 89 fl (ref 78.0–100.0)
Monocytes Absolute: 0.9 10*3/uL (ref 0.1–1.0)
Monocytes Relative: 10.6 % (ref 3.0–12.0)
Neutro Abs: 6.1 10*3/uL (ref 1.4–7.7)
Neutrophils Relative %: 70.4 % (ref 43.0–77.0)
Platelets: 313 10*3/uL (ref 150.0–400.0)
RBC: 4.72 Mil/uL (ref 3.87–5.11)
RDW: 12.8 % (ref 11.5–15.5)
WBC: 8.7 10*3/uL (ref 4.0–10.5)

## 2022-04-08 LAB — COMPREHENSIVE METABOLIC PANEL
ALT: 33 U/L (ref 0–35)
AST: 40 U/L — ABNORMAL HIGH (ref 0–37)
Albumin: 4.5 g/dL (ref 3.5–5.2)
Alkaline Phosphatase: 52 U/L (ref 39–117)
BUN: 19 mg/dL (ref 6–23)
CO2: 29 mEq/L (ref 19–32)
Calcium: 9.9 mg/dL (ref 8.4–10.5)
Chloride: 100 mEq/L (ref 96–112)
Creatinine, Ser: 0.8 mg/dL (ref 0.40–1.20)
GFR: 71.89 mL/min (ref >60.00)
Glucose, Bld: 151 mg/dL — ABNORMAL HIGH (ref 70–99)
Potassium: 3.6 mEq/L (ref 3.5–5.1)
Sodium: 140 mEq/L (ref 135–145)
Total Bilirubin: 1 mg/dL (ref 0.2–1.2)
Total Protein: 7.7 g/dL (ref 6.0–8.3)

## 2022-04-08 LAB — HEMOGLOBIN A1C: Hgb A1c MFr Bld: 6.4 % (ref 4.6–6.5)

## 2022-04-08 MED ORDER — ALPRAZOLAM 1 MG PO TABS
ORAL_TABLET | ORAL | 1 refills | Status: DC
Start: 2022-04-08 — End: 2022-11-05

## 2022-04-08 MED ORDER — LEVOCETIRIZINE DIHYDROCHLORIDE 5 MG PO TABS: 5.0000 mg | ORAL_TABLET | Freq: Every morning | ORAL | 3 refills | Status: DC

## 2022-04-08 NOTE — Progress Notes (Signed)
Phone [phone] In person visit   Subjective:   Felicia Perry is a 76 y.o. year old very pleasant female patient who presents for/with See problem oriented charting Chief Complaint  Patient presents with   Follow-up   Hyperlipidemia   Diabetes   Hypertension    Past Medical History-  Patient Active Problem List   Diagnosis Date Noted   Ductal carcinoma in situ (DCIS) of right breast 01/05/2022    Priority: High   Diabetes mellitus type 2 with complications ([place]) 44/10/270    Priority: High   Carotid stenosis 01/23/2020    Priority: High   Chronic diastolic CHF (congestive heart failure) ([place]) 01/12/2019    Priority: High   Coronary artery disease of native artery of native heart with stable angina pectoris ([place])     Priority: High   Syncope 08/04/2016    Priority: High   statin myalgia 06/25/2020    Priority: Medium    Hyperlipidemia, unspecified 07/11/2018    Priority: Medium    H/O hyperparathyroidism 08/18/2016    Priority: Medium    Vitamin D deficiency 08/20/2015    Priority: Medium    Chronic pain syndrome 03/26/2015    Priority: Medium    Insomnia 07/13/2014    Priority: Medium    GERD (gastroesophageal reflux disease) 02/25/2011    Priority: Medium    Depression 04/22/2007    Priority: Medium    Essential hypertension 04/22/2007    Priority: Medium    Asthma 04/22/2007    Priority: Medium    Greater trochanteric bursitis of left hip 08/13/2020    Priority: Low   Senile purpura ([place]) 08/07/2020    Priority: Low   Degenerative arthritis of knee, bilateral 12/08/2018    Priority: Low   Partial hamstring tear, initial encounter 11/16/2018    Priority: Low   Nonallopathic lesion of sacral region 07/05/2018    Priority: Low   Nonallopathic lesion of cervical region 07/05/2018    Priority: Low   DDD (degenerative disc disease), cervical 05/04/2018    Priority: Low   Degenerative arthritis of left knee 09/15/2017    Priority: Low   PUD (peptic  ulcer disease)     Priority: Low   Hyperglycemia 08/06/2015    Priority: Low   Xerosis of skin 08/06/2015    Priority: Low   Hypersomnia with sleep apnea 03/26/2015    Priority: Low   Nonallopathic lesion of lumbosacral region 11/13/2014    Priority: Low   Osteoarthritis of left lower extremity 10/17/2014    Priority: Low   Chronic meniscal tear of knee 10/17/2014    Priority: Low   Nonallopathic lesion of thoracic region 09/14/2014    Priority: Low   Nonallopathic lesion-rib cage 08/21/2014    Priority: Low   Tendinopathy of right rotator cuff 07/30/2014    Priority: Low   Piriformis syndrome of right side 07/30/2014    Priority: Low   Limited joint range of motion 07/13/2014    Priority: Low   Rash 07/13/2014    Priority: Low   SI (sacroiliac) joint dysfunction 10/10/2007    Priority: Low   LOW BACK PAIN 07/01/2007    Priority: Low   Genetic testing 01/19/2022   Intractable headache 01/02/2021    Medications- reviewed and updated Current Outpatient Medications  Medication Sig Dispense Refill   acetaminophen (TYLENOL) 325 MG tablet Take 650 mg by mouth every 6 (six) hours as needed.     albuterol (VENTOLIN HFA) 108 (90 Base) MCG/ACT inhaler Inhale  2 puffs into the lungs every 6 (six) hours as needed for wheezing or shortness of breath. 18 g 5   amLODipine (NORVASC) 10 MG tablet Take 1 tablet (10 mg total) by mouth daily. 90 tablet 3   anastrozole (ARIMIDEX) 1 MG tablet Take 1 tablet (1 mg total) by mouth daily. 30 tablet 3   aspirin EC 81 MG tablet Take 1 tablet (81 mg total) by mouth daily. Swallow whole. 90 tablet 3   Bempedoic Acid-Ezetimibe (NEXLIZET) 180-10 MG TABS Take 180 mg by mouth daily. 90 tablet 3   budesonide-formoterol (SYMBICORT) 160-4.5 MCG/ACT inhaler Inhale 2 puffs into the lungs 2 (two) times daily. 1 each 11   carvedilol (COREG) 6.25 MG tablet Take 1 tablet (6.25 mg total) by mouth 2 (two) times daily. 180 tablet 3   famotidine (PEPCID) 20 MG tablet  TAKE ONE TABLET TWICE DAILY 60 tablet 5   irbesartan (AVAPRO) 300 MG tablet Take 1 tablet (300 mg total) by mouth daily. 90 tablet 2   omega-3 acid ethyl esters (LOVAZA) 1 g capsule TAKE TWO CAPSULES EVERY DAY 180 capsule 3   venlafaxine XR (EFFEXOR-XR) 75 MG 24 hr capsule TAKE ONE CAPSULE EACH DAY WITH BREAKFAST 90 capsule 2   ALPRAZolam (XANAX) 1 MG tablet TAKE ONE TABLET AT BEDTIME AS NEEDED FOR SLEEP -DO NOT DRIVE FOR 8 HOURS AFTER TAKING 90 tablet 1   levocetirizine (XYZAL) 5 MG tablet Take 1 tablet (5 mg total) by mouth every morning. 90 tablet 3   nitroGLYCERIN (NITROSTAT) 0.4 MG SL tablet Place 1 tablet (0.4 mg total) under the tongue every 5 (five) minutes as needed for chest pain. 25 tablet 6   No current facility-administered medications for this visit.     Objective:  BP 132/68   Pulse 79   Temp 98 F (36.7 C)   Ht 5' (1.524 m)   Wt 162 lb (73.5 kg)   SpO2 97%   BMI 31.64 kg/m  Gen: NAD, resting comfortably CV: RRR  3/6 murmur aortic area- known sclerosis. 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  #Right breast DCIS on biopsy 12/31/2021-she has had a lumpectomy and has completed adjuvant radiation on 04/02/2022.  She is taking anastrozole as well and plans for 28-monthfollow-up  -Follows with Dr. IChryl Heck- even made several friends at cancer which was good after losses in recent years- has had lunches, etc!   #Left knee arthritis-had Orthovisc injections with Dr. CGeorgina Snellmost recently 02/12/2022-she reports has been helpful   # Diabetes-diagnosed 08/07/2020 after having second A1c 6.5 or above S: Medication: None, currently diet controlled CBGs- doesn't check Exercise and diet- down several lbs from last visit Lab Results  Component Value Date   HGBA1C  7.0 (H) 12/09/2021   HGBA1C 7.5 (H) 08/12/2021   HGBA1C 6.7 (A) 02/05/2021  A/P: hopefully stable/improved- update a1c today. Continue withoutmeds for now -shes prefer to get down to 145  #hypertension #Chronic diastolic CHF-declined SGLT2 inhibitor or spironolactone S: medication: Amlodipine 10 mg, irbesartan 300 mg, coreg 6.25 mg BID -Has required Lasix in the past for fluid overload, cautious with HCTZ due to hercalcemia history -no swelling. Stable SOB A/P: HTN- Controlled. Continue current medications.  CHF controlled- actually losing weight- continue current meds   #  CAD-most recently seen by cardiology December 24, 2021 #hyperlipidemia with statin myalgia #Carotid stenosis-checked September each year or every other year by cardiology-plan for repeat September 2023 S: Medication: nexlizet (bempedoic acid-ezetimibe), Lovaza, aspirin 81 mg -History of syncope related to ischemia- no recent even presycopal symptoms -Still struggles with elevated triglycerides on this regimen- mild elevation - no chest pain- stable shortness of breath Lab Results  Component Value Date   CHOL 116 12/09/2021   HDL 32.90 (L) 12/09/2021   LDLCALC 48 08/07/2020   LDLDIRECT 53.0 12/09/2021   TRIG 217.0 (H) 12/09/2021   CHOLHDL 4 12/09/2021   A/P: CAD stable (baseline SOB)- contiue current meds. Wants to get back to exercise after radiation as has some fatigue/deconditioning -lipids at reaosnable goal LDL 70 or less Carotid stenosis- plans for recheck in sept per cardiology notes  # Asthma/allergies S: Maintenance Medication: Symbicort 160-4.52 puffs.  For allergies-on Xyzal As needed medication: . Patient is not needing albuterol.  A/P: doing very well x2- continue to monitor   #Depression-medication also helps with chronic pain/insomnia S: Medication: Effexor 75 mg extended release alprazolam 1 mg as needed before bed. No depression or SI despite cancer battle recently A/P: reasonable control-  continue current meds    #GERD S: Medication: Pepcid 20 mg twice daily A/P: Controlled. Continue current medications. Better with improved diet   #Vitamin D deficiency S: Medication: 2000 units a day Last vitamin D Lab Results  Component Value Date   VD25OH 45.47 08/12/2021  A/P: Excellent control last check-recheck annually  #senile purpura- noted- has random people point this out. Aspirin contributes but needs her aspirin. Stable. Check cbc at least annually  Recommended follow up: Return in about 19 weeks (around 08/19/2022) for physical or sooner if needed.Schedule b4 you leave. Future Appointments  Date Time Provider [place]  05/04/2022 11:30 AM Gery Pray, MD [facility] None  06/26/2022  3:30 PM [facility] VASC 3 [facility]  08/04/2022  3:45 PM [facility] CCM PHARMACIST [facility] PEC  10/06/2022  1:15 PM [name], MD [facility] None  10/12/2022  8:45 AM [facility] HEALTH COACH [facility] PEC  01/07/2023  2:30 PM [place], Ronalee Red, PA-C [facility]    Lab/Order associations:   ICD-10-CM   1. Diabetes mellitus type 2 with complications (HCC)  X10.6 CBC with Differential/Platelet    Comprehensive metabolic panel    Hemoglobin A1c    2. Chronic diastolic CHF (congestive heart failure) (HCC)  I50.32     3. Hyperlipidemia, unspecified hyperlipidemia type  E78.5 CBC with Differential/Platelet    Comprehensive metabolic panel    4. Essential hypertension  I10 CBC with Differential/Platelet    Comprehensive metabolic panel    5. Senile purpura (HCC)  D69.2       Meds ordered this encounter  Medications   ALPRAZolam (XANAX) 1 MG tablet    Sig: TAKE ONE TABLET AT BEDTIME AS NEEDED FOR SLEEP -DO NOT DRIVE FOR 8 HOURS AFTER TAKING    Dispense:  90 tablet    Refill:  1   levocetirizine (XYZAL) 5 MG tablet    Sig: Take 1 tablet (5 mg total) by mouth every morning.    Dispense:  90 tablet    Refill:  3    Return precautions advised.  Garret Reddish,  MD

## 2022-04-08 NOTE — Patient Instructions (Addendum)
  Please stop by lab before you go If you have mychart- we will send your results within 3 business days of [place] receiving them.  If you do not have mychart- we will call you about results within 5 business days of [place] receiving them.  *please also note that you will see labs on mychart as soon as they post. I will later go in and write notes on them- will say "notes from Dr. Yong Channel"   No changes today  Congrats on doing so well with your cancer treatments  Recommended follow up: Return in about 19 weeks (around 08/19/2022) for physical or sooner if needed.Schedule b4 you leave. 08/13/21

## 2022-04-27 ENCOUNTER — Encounter: Payer: Self-pay | Admitting: *Deleted

## 2022-04-29 ENCOUNTER — Encounter: Payer: Self-pay | Admitting: Radiation Oncology

## 2022-05-03 NOTE — Progress Notes (Signed)
Radiation Oncology         [phone] ________________________________  Name: Felicia Perry MRN: [id number]  Date: 05/04/2022  DOB: August 18, 1946  Follow-Up Visit Note  CC: Marin Olp, MD  [name], MD  No diagnosis found.  Diagnosis: S/p right lumpectomy: Stage IA (T1a, Nx) Right Breast, Invasive ductal adenocarcinoma with extensive intermediate grade DCIS, ER+ / PR- / Her2-, Grade 1  Interval Since Last Radiation: 1 month and 2 days   Intent: Curative  Radiation Treatment Dates: 03/04/2022 through 04/02/2022 Site Technique Total Dose (Gy) Dose per Fx (Gy) Completed Fx Beam Energies  Breast, Right: Breast_R 3D 40.05/40.05 2.67 15/15 10X, 6XFFF  Breast, Right: Breast_R_Bst 3D 12/12 2 6/6 6X, 10X    Narrative:  The patient returns today for routine follow-up. The patient tolerated radiation therapy relatively well. During her final weekly treatment check on 03/31/22, the patient reported mild right breast pain, fatigue, an itchy rash, and improvement in a fungal infection associated rash to the groin (not related to RT). Physical exam performed on that same date revealed some hyperpigmentation changes and erythema to the right breast area. A fair amount of radiation dermatitis was also noted. No skin breakdown was appreciated.        During her most recent follow up with Dr. Chryl Heck on 04/01/22, the patient reported fatigue from radiation and a skin rash as above. The patient was also noted to be tolerating anastrozole well other than mild nausea, and denied any other complaints. The patient will return to Dr. Chryl Heck in 5 months for routine follow-up.    Otherwise, no significant interval history since the patient was seen for her initial consultation this past May.     ***                       Allergies:  is allergic to celexa [citalopram hydrobromide], cephalosporins, clarithromycin, doxycycline hyclate, levofloxacin, penicillins, rosuvastatin, and zolpidem  tartrate.  Meds: Current Outpatient Medications  Medication Sig Dispense Refill   acetaminophen (TYLENOL) 325 MG tablet Take 650 mg by mouth every 6 (six) hours as needed.     albuterol (VENTOLIN HFA) 108 (90 Base) MCG/ACT inhaler Inhale 2 puffs into the lungs every 6 (six) hours as needed for wheezing or shortness of breath. 18 g 5   ALPRAZolam (XANAX) 1 MG tablet TAKE ONE TABLET AT BEDTIME AS NEEDED FOR SLEEP -DO NOT DRIVE FOR 8 HOURS AFTER TAKING 90 tablet 1   amLODipine (NORVASC) 10 MG tablet Take 1 tablet (10 mg total) by mouth daily. 90 tablet 3   anastrozole (ARIMIDEX) 1 MG tablet Take 1 tablet (1 mg total) by mouth daily. 30 tablet 3   aspirin EC 81 MG tablet Take 1 tablet (81 mg total) by mouth daily. Swallow whole. 90 tablet 3   Bempedoic Acid-Ezetimibe (NEXLIZET) 180-10 MG TABS Take 180 mg by mouth daily. 90 tablet 3   budesonide-formoterol (SYMBICORT) 160-4.5 MCG/ACT inhaler Inhale 2 puffs into the lungs 2 (two) times daily. 1 each 11   carvedilol (COREG) 6.25 MG tablet Take 1 tablet (6.25 mg total) by mouth 2 (two) times daily. 180 tablet 3   famotidine (PEPCID) 20 MG tablet TAKE ONE TABLET TWICE DAILY 60 tablet 5   irbesartan (AVAPRO) 300 MG tablet Take 1 tablet (300 mg total) by mouth daily. 90 tablet 2   levocetirizine (XYZAL) 5 MG tablet Take 1 tablet (5 mg total) by mouth every morning. 90 tablet 3   nitroGLYCERIN (  NITROSTAT) 0.4 MG SL tablet Place 1 tablet (0.4 mg total) under the tongue every 5 (five) minutes as needed for chest pain. 25 tablet 6   omega-3 acid ethyl esters (LOVAZA) 1 g capsule TAKE TWO CAPSULES EVERY DAY 180 capsule 3   venlafaxine XR (EFFEXOR-XR) 75 MG 24 hr capsule TAKE ONE CAPSULE EACH DAY WITH BREAKFAST 90 capsule 2   No current facility-administered medications for this encounter.    Physical Findings: The patient is in no acute distress. Patient is alert and oriented.  vitals were not taken for this visit. .  No significant changes. Lungs are  clear to auscultation bilaterally. Heart has regular rate and rhythm. No palpable cervical, supraclavicular, or axillary adenopathy. Abdomen soft, non-tender, normal bowel sounds.  Left Breast: no palpable mass, nipple discharge or bleeding. Right Breast: ***  Lab Findings: Lab Results  Component Value Date   WBC 8.7 04/08/2022   HGB 14.1 04/08/2022   HCT 42.0 04/08/2022   MCV 89.0 04/08/2022   PLT 313.0 04/08/2022    Radiographic Findings: No results found.  Impression:  S/p right lumpectomy: Stage IA (T1a, Nx) Right Breast, Invasive ductal adenocarcinoma with extensive intermediate grade DCIS, ER+ / PR- / Her2-, Grade 1  The patient is recovering from the effects of radiation.  ***  Plan:  ***   *** minutes of total time was spent for this patient encounter, including preparation, face-to-face counseling with the patient and coordination of care, physical exam, and documentation of the encounter. ____________________________________  Blair Promise, PhD, MD  This document serves as a record of services personally performed by Gery Pray, MD. It was created on his behalf by Roney Mans, a trained medical scribe. The creation of this record is based on the scribe's personal observations and the provider's statements to them. This document has been checked and approved by the attending provider.

## 2022-05-03 NOTE — Progress Notes (Incomplete)
  Radiation Oncology         [phone] ________________________________  Patient Name: Felicia Perry MRN: [id number] DOB: Apr 26, 1946 Referring Physician: [name] Date of Service: 04/02/2022 [facility], [place]                                                        End Of Treatment Note  Diagnoses: D05.11-Intraductal carcinoma in situ of right breast  Cancer Staging:  S/p right lumpectomy: Stage IA (T1a, Nx) Right Breast, Invasive ductal adenocarcinoma with extensive intermediate grade DCIS, ER+ / PR- / Her2-, Grade 1  Intent: Curative  Radiation Treatment Dates: 03/04/2022 through 04/02/2022 Site Technique Total Dose (Gy) Dose per Fx (Gy) Completed Fx Beam Energies  Breast, Right: Breast_R 3D 40.05/40.05 2.67 15/15 10X, 6XFFF  Breast, Right: Breast_R_Bst 3D 12/12 2 6/6 6X, 10X   Narrative: The patient tolerated radiation therapy relatively well. During her final weekly treatment check on 03/31/22, the patient reported mild right breast pain, fatigue, an itchy rash, and improvement in a fungal infection associated rash to the groin (not related to RT). Physical exam performed on that same date revealed some hyperpigmentation changes and erythema to the right breast area. A fair amount of radiation dermatitis was also noted. No skin breakdown was appreciated.   Plan: The patient will follow-up with radiation oncology in one month .  ________________________________________________ -----------------------------------  Blair Promise, PhD, MD  This document serves as a record of services personally performed by Gery Pray, MD. It was created on his behalf by Roney Mans, a trained medical scribe. The creation of this record is based on the scribe's personal observations and the provider's statements to them. This document has been checked and approved by the attending provider.

## 2022-05-04 ENCOUNTER — Encounter: Payer: Self-pay | Admitting: Radiation Oncology

## 2022-05-04 ENCOUNTER — Ambulatory Visit
Admission: RE | Admit: 2022-05-04 | Discharge: 2022-05-04 | Disposition: A | Discharge status: Home / Self Care | Payer: Medicare Other | Source: Ambulatory Visit | Attending: Radiation Oncology | Admitting: Radiation Oncology

## 2022-05-04 ENCOUNTER — Other Ambulatory Visit: Payer: Self-pay

## 2022-05-04 DIAGNOSIS — D0511 Intraductal carcinoma in situ of right breast: Secondary | ICD-10-CM | POA: Insufficient documentation

## 2022-05-04 DIAGNOSIS — Z170 Estrogen receptor positive status [ER+]: Secondary | ICD-10-CM | POA: Insufficient documentation

## 2022-05-04 DIAGNOSIS — Z7982 Long term (current) use of aspirin: Secondary | ICD-10-CM | POA: Insufficient documentation

## 2022-05-04 DIAGNOSIS — Z79899 Other long term (current) drug therapy: Secondary | ICD-10-CM | POA: Diagnosis not present

## 2022-05-04 DIAGNOSIS — Z7951 Long term (current) use of inhaled steroids: Secondary | ICD-10-CM | POA: Insufficient documentation

## 2022-05-04 DIAGNOSIS — Z923 Personal history of irradiation: Secondary | ICD-10-CM | POA: Diagnosis not present

## 2022-05-04 HISTORY — DX: Personal history of irradiation: Z92.3

## 2022-05-04 NOTE — Progress Notes (Signed)
Felicia Perry is here today for follow up post radiation to the breast.   Breast Side:Right   They completed their radiation on: 04/02/2022   Does the patient complain of any of the following: Post radiation skin issues: No, patient states skin has healed.  Breast Tenderness: No Breast Swelling: No Lymphadema: No Range of Motion limitations: No Fatigue post radiation: No. Patient has a good energy level.  Appetite good/fair/poor: Good  Additional comments if applicable:   BP (!) 129/29   Pulse 74   Temp (!) 97.3 F (36.3 C)   Resp 18   Wt 162 lb (73.5 kg)   SpO2 98%   BMI 31.64 kg/m

## 2022-05-08 ENCOUNTER — Telehealth: Payer: Self-pay | Admitting: Hematology and Oncology

## 2022-05-08 NOTE — Telephone Encounter (Signed)
.  Called patient to schedule appointment per 7/15 inbasket, patient is aware of date and time.

## 2022-05-12 ENCOUNTER — Telehealth: Payer: Self-pay | Admitting: Pharmacist

## 2022-05-12 NOTE — Progress Notes (Signed)
Chronic Care Management Pharmacy Assistant   Name: Felicia Perry  MRN: [id number] DOB: 30-Apr-1946   Reason for Encounter: Hypertension Adherence Call   Recent office visits: 04/08/2022 [facility] (PCP) Marin Olp, MD; no medication changes indicated.  Recent consult visits:  04/01/2022 [facility] (Oncology) [name], MD; no medication changes indicated.  Hospital visits:  None in previous 6 months  Medications: Outpatient Encounter Medications as of 05/12/2022  Medication Sig   acetaminophen (TYLENOL) 325 MG tablet Take 650 mg by mouth every 6 (six) hours as needed.   albuterol (VENTOLIN HFA) 108 (90 Base) MCG/ACT inhaler Inhale 2 puffs into the lungs every 6 (six) hours as needed for wheezing or shortness of breath. (Patient not taking: Reported on 05/04/2022)   ALPRAZolam (XANAX) 1 MG tablet TAKE ONE TABLET AT BEDTIME AS NEEDED FOR SLEEP -DO NOT DRIVE FOR 8 HOURS AFTER TAKING   amLODipine (NORVASC) 10 MG tablet Take 1 tablet (10 mg total) by mouth daily.   anastrozole (ARIMIDEX) 1 MG tablet Take 1 tablet (1 mg total) by mouth daily.   aspirin EC 81 MG tablet Take 1 tablet (81 mg total) by mouth daily. Swallow whole.   Bempedoic Acid-Ezetimibe (NEXLIZET) 180-10 MG TABS Take 180 mg by mouth daily.   budesonide-formoterol (SYMBICORT) 160-4.5 MCG/ACT inhaler Inhale 2 puffs into the lungs 2 (two) times daily.   carvedilol (COREG) 6.25 MG tablet Take 1 tablet (6.25 mg total) by mouth 2 (two) times daily.   famotidine (PEPCID) 20 MG tablet TAKE ONE TABLET TWICE DAILY   irbesartan (AVAPRO) 300 MG tablet Take 1 tablet (300 mg total) by mouth daily.   levocetirizine (XYZAL) 5 MG tablet Take 1 tablet (5 mg total) by mouth every morning.   omega-3 acid ethyl esters (LOVAZA) 1 g capsule TAKE TWO CAPSULES EVERY DAY   venlafaxine XR (EFFEXOR-XR) 75 MG 24 hr capsule TAKE ONE CAPSULE EACH DAY WITH BREAKFAST   No facility-administered encounter medications on file as of 05/12/2022.   Reviewed  chart prior to disease state call. Spoke with patient regarding BP  Recent Office Vitals: BP Readings from Last 3 Encounters:  05/04/22 (!) 153/83  04/08/22 132/68  04/01/22 (!) 149/73   Pulse Readings from Last 3 Encounters:  05/04/22 74  04/08/22 79  04/01/22 82    Wt Readings from Last 3 Encounters:  05/04/22 162 lb (73.5 kg)  04/08/22 162 lb (73.5 kg)  04/01/22 164 lb 8 oz (74.6 kg)     Kidney Function Lab Results  Component Value Date/Time   CREATININE 0.80 04/08/2022 11:07 AM   CREATININE 0.90 01/07/2022 09:02 AM   CREATININE 0.92 12/22/2021 08:02 AM   CREATININE 0.89 08/07/2020 08:45 AM   CREATININE 0.94 (H) 10/20/2016 09:34 AM   GFR 71.89 04/08/2022 11:07 AM   GFRNONAA >60 01/07/2022 09:02 AM   GFRNONAA 64 08/07/2020 08:45 AM   GFRAA 74 08/07/2020 08:45 AM       Latest Ref Rng & Units 04/08/2022   11:07 AM 01/07/2022    9:02 AM 12/22/2021    8:02 AM  BMP  Glucose 70 - 99 mg/dL 151  203  150   BUN 6 - 23 mg/dL 19  39  26   Creatinine 0.40 - 1.20 mg/dL 0.80  0.90  0.92   Sodium 135 - 145 mEq/L 140  140  143   Potassium 3.5 - 5.1 mEq/L 3.6  4.1  4.6   Chloride 96 - 112 mEq/L 100  103  103  CO2 19 - 32 mEq/L 29  32  30   Calcium 8.4 - 10.5 mg/dL 9.9  9.4  9.9     Current antihypertensive regimen:  Amlodipine 10 mg daily Carvedilol 6.25 mg twice daily Irbesartan 300 mg daily  How often are you checking your Blood Pressure? 1-2x per week  Current home BP readings: 127/80  What recent interventions/DTPs have been made by any provider to improve Blood Pressure control since last CPP Visit: No recent interventions or DTPs.  Any recent hospitalizations or ED visits since last visit with CPP? No  What diet changes have been made to improve Blood Pressure Control?  Patient states she doesn't avoid salt, she doesn't add any additional salt to her food.  What exercise is being done to improve your Blood Pressure Control?  Patient is currently cleaning houses  every day.  Adherence Review: Is the patient currently on ACE/ARB medication? Yes Does the patient have >5 day gap between last estimated fill dates? No   Care Gaps: Medicare Annual Wellness: Completed 09/29/2021 Ophthalmology Exam: Next due on 12/26/2022 Foot Exam: Next due on 08/12/2022 Hemoglobin A1C: 7.5% on 08/12/2021 Colonoscopy: Discontinued Fecal DNA (Cologuard): Next due on 01/15/2025 Dexa Scan: Completed Mammogram: Last completed 09/18/2021  Future Appointments  Date Time Provider [place]  06/26/2022  3:30 PM [facility] 3 [facility]  07/07/2022  9:15 AM [name], NP [facility] None  08/04/2022  3:45 PM [facility]CM PHARMACIST [facility]  09/04/2022  2:00 PM Marin Olp, MD [facility]  10/06/2022  1:15 PM [name], MD [facility] None  10/12/2022  8:45 AM [facility] HEALTH COACH [facility]  01/07/2023  2:30 PM Warren Danes, PA-C [facility]   Star Rating Drugs: Irbesartan 300 mg last filled 02/20/2022 90 DS  April D Calhoun, [place] Pharmacist Assistant [phone]

## 2022-05-18 ENCOUNTER — Other Ambulatory Visit: Payer: Self-pay

## 2022-05-18 DIAGNOSIS — E782 Mixed hyperlipidemia: Secondary | ICD-10-CM

## 2022-05-18 DIAGNOSIS — I10 Essential (primary) hypertension: Secondary | ICD-10-CM

## 2022-05-18 DIAGNOSIS — I25110 Atherosclerotic heart disease of native coronary artery with unstable angina pectoris: Secondary | ICD-10-CM

## 2022-05-18 DIAGNOSIS — Z789 Other specified health status: Secondary | ICD-10-CM

## 2022-05-18 MED ORDER — AMLODIPINE BESYLATE 10 MG PO TABS: 10.0000 mg | ORAL_TABLET | Freq: Every day | ORAL | 2 refills | Status: DC

## 2022-05-18 MED ORDER — IRBESARTAN 300 MG PO TABS: 300.0000 mg | ORAL_TABLET | Freq: Every day | ORAL | 2 refills | Status: DC

## 2022-05-18 NOTE — Addendum Note (Signed)
Addended by: Carter Kitten D on: 05/18/2022 07:57 AM   Modules accepted: Orders

## 2022-06-16 ENCOUNTER — Ambulatory Visit: Payer: Medicare Other | Admitting: Hematology and Oncology

## 2022-06-26 ENCOUNTER — Ambulatory Visit (HOSPITAL_COMMUNITY)
Admission: RE | Admit: 2022-06-26 | Discharge: 2022-06-26 | Disposition: A | Discharge status: Home / Self Care | Payer: Medicare Other | Source: Ambulatory Visit | Attending: Cardiology | Admitting: Cardiology

## 2022-06-26 DIAGNOSIS — I6523 Occlusion and stenosis of bilateral carotid arteries: Secondary | ICD-10-CM | POA: Insufficient documentation

## 2022-06-26 DIAGNOSIS — I6529 Occlusion and stenosis of unspecified carotid artery: Secondary | ICD-10-CM | POA: Insufficient documentation

## 2022-06-30 ENCOUNTER — Telehealth: Payer: Self-pay

## 2022-06-30 NOTE — Telephone Encounter (Signed)
**Note De-Identified Liliya Fullenwider Obfuscation** Nexlizet PA started through covermymeds. Key: Becky Sax

## 2022-07-01 ENCOUNTER — Telehealth: Payer: Self-pay | Admitting: *Deleted

## 2022-07-01 DIAGNOSIS — I6529 Occlusion and stenosis of unspecified carotid artery: Secondary | ICD-10-CM

## 2022-07-01 DIAGNOSIS — I6523 Occlusion and stenosis of bilateral carotid arteries: Secondary | ICD-10-CM

## 2022-07-01 NOTE — Telephone Encounter (Signed)
-----   Message from [name], MD sent at 06/30/2022  7:25 PM EDT ----- Her carotids show mild disease on the right and moderate disease on the left. It looks like it has progressed slightly on the left but not to the degree that needs fixing. We will continue her aspirin and nexlizet. Will repeat carotid ultrasound next year for monitoring.

## 2022-07-01 NOTE — Telephone Encounter (Signed)
The patient has been notified of the result and verbalized understanding.  All questions (if any) were answered.  Pt aware to continue her ASA and nexlizet.   Pt aware she will need repeat carotids in one year for surveillance.  Pt aware I will go ahead and place the order for repeat carotids in one year in the system, and send a message to our PV Scheduler to call her back and arrange this appt for that time.   Pt verbalized understanding and agrees with this plan.

## 2022-07-02 NOTE — Telephone Encounter (Signed)
**Note De-Identified Seraiah Nowack Obfuscation** Verlene Keats Key: [id number] Outcome:  Approved on September 5 Effective from 06/30/2022 through 07/01/2023. Drug Nexlizet 180-'10MG'$  tablets Form [employer] [place] [employer] Part D Electronic Request Form (CB)  I have notified the pharmacist at [facility], [place] [address] (Ph: [phone]) of this approval.

## 2022-07-03 ENCOUNTER — Ambulatory Visit: Payer: Medicare Other | Admitting: Family Medicine

## 2022-07-03 ENCOUNTER — Encounter: Payer: Self-pay | Admitting: Family Medicine

## 2022-07-03 VITALS — BP 138/86 | HR 98 | Ht 60.0 in | Wt 164.0 lb

## 2022-07-03 DIAGNOSIS — M170 Bilateral primary osteoarthritis of knee: Secondary | ICD-10-CM

## 2022-07-03 DIAGNOSIS — M9903 Segmental and somatic dysfunction of lumbar region: Secondary | ICD-10-CM

## 2022-07-03 DIAGNOSIS — M9904 Segmental and somatic dysfunction of sacral region: Secondary | ICD-10-CM

## 2022-07-03 DIAGNOSIS — M5441 Lumbago with sciatica, right side: Secondary | ICD-10-CM | POA: Diagnosis not present

## 2022-07-03 DIAGNOSIS — M9902 Segmental and somatic dysfunction of thoracic region: Secondary | ICD-10-CM | POA: Diagnosis not present

## 2022-07-03 DIAGNOSIS — G8929 Other chronic pain: Secondary | ICD-10-CM | POA: Diagnosis not present

## 2022-07-03 NOTE — Progress Notes (Signed)
Felicia Perry [address] [place] Phone: [phone] Subjective:   Felicia Perry, am serving as a scribe for Dr. Hulan Saas.  I'm seeing this patient by the request  of:  Marin Olp, MD  CC: bilateral knee pain   IRC:[id number]  April 2023 Saw Dr. Georgina Snell for gel injections. Last seen by Dr. Tamala Julian Feb 2023 for OMT.  Updated 07/03/2022 Felicia Perry is a 76 y.o. female coming in with complaint of bilateral knee pain. Needs some new knees , giving her a lot of pain, she is afraid that she is going to fall down stairs because her knees have been giving out on her . Is not able to do her activities like riding her bike and play tennis      Past Medical History:  Diagnosis Date   Adenomatous polyp 11/23/2005   Anxiety    01/09/22- patient denies anxiety   Asthma 2003   Basal cell carcinoma 2021   right cheek per patient at [facility]   Carotid stenosis    Chronic diastolic CHF (congestive heart failure) ([place]) 2017   Echo 2021 was normal   COPD (chronic obstructive pulmonary disease) ([place])    Coronary artery disease 2017   a. HC on 08/14/16 showed RCA CTO s/p PCI, 60% LCX disease managed medically with recs to consider staged PCI if continued symptoms.    Depression 2008   pt reports MD diagnosed but she does not feel depressed   Diabetes mellitus without complication ([place])    DIVERTICULOSIS, COLON 04/22/2007        GERD (gastroesophageal reflux disease) 2012   History of radiation therapy    Right breast- 03/04/22-04/02/22- Dr. Gery Pray   History of shingles    Hypertension 2008   Hypertensive heart disease    Insomnia 2003   Internal hemorrhoids    Osteoarthritis 2008   "knees, hands, lower back" (08/14/2016)   Pap smear for cervical cancer screening 2002   results normal   PUD (peptic ulcer disease) 1989   Seasonal allergies 2013   Sleep apnea    a. resolved with weight loss   Past Surgical  History:  Procedure Laterality Date   BREAST BIOPSY Right 12/30/2021   BREAST EXCISIONAL BIOPSY Left    1980's x 3 - all benign   BREAST EXCISIONAL BIOPSY Right    1980's x 1 or 2 - all benign   BREAST LUMPECTOMY WITH RADIOACTIVE SEED LOCALIZATION Right 01/22/2022   Procedure: RIGHT BREAST LUMPECTOMY WITH RADIOACTIVE SEED LOCALIZATION;  Surgeon: Erroll Luna, MD;  Location: [facility];  Service: General;  Laterality: Right;   BREAST SURGERY Left 1980s   Fibrous Tumors removed    CARDIAC CATHETERIZATION N/A 08/14/2016   Procedure: Left Heart Cath and Coronary Angiography;  Surgeon: Sherren Mocha, MD;  Location: [facility];  Service: Cardiovascular;  Laterality: N/A;   CARDIAC CATHETERIZATION N/A 08/14/2016   Procedure: Coronary Stent Intervention;  Surgeon: Sherren Mocha, MD;  Location: [facility];  Service: Cardiovascular;  Laterality: N/A;   CATARACT EXTRACTION W/ INTRAOCULAR LENS  IMPLANT, BILATERAL Bilateral 05/2009   COLONOSCOPY N/A 2016   Cologuard 2019   CORONARY ANGIOPLASTY     detached retina left eye     may 2019   TUBAL LIGATION  1970s   Social History   Socioeconomic History   Marital status: Divorced    Spouse name: Not on file   Number of  children: Not on file   Years of education: Not on file   Highest education level: Not on file  Occupational History   Occupation: Cleaning  Tobacco Use   Smoking status: Former    Packs/day: 1.00    Years: 26.00    Total pack years: 26.00    Types: Cigarettes    Quit date: 1989    Years since quitting: 34.7   Smokeless tobacco: Never  Vaping Use   Vaping Use: Never used  Substance and Sexual Activity   Alcohol use: No   Drug use: No   Sexual activity: Not Currently  Other Topics Concern   Not on file  Social History Narrative   Divorced for 46 years in 2016. 2 sons- 1 son had been in prison now living with her in 2019.  2 granddaughters with 1 living close.        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 Best friend [name] patient of Dr. Yong Channel. Enjoys work, time on Teaching laboratory technician.    Social Determinants of Health   Financial Resource Strain: Low Risk  (01/07/2022)   Overall Financial Resource Strain (CARDIA)    Difficulty of Paying Living Expenses: Not very hard  Food Insecurity: No Food Insecurity (01/07/2022)   Hunger Vital Sign    Worried About Running Out of Food in the Last Year: Never true    Ran Out of Food in the Last Year: Never true  Transportation Needs: No Transportation Needs (01/07/2022)   PRAPARE - Hydrologist (Medical): No    Lack of Transportation (Non-Medical): No  Physical Activity: Sufficiently Active (09/29/2021)   Exercise Vital Sign    Days of Exercise per Week: 2 days    Minutes of Exercise per Session: 120 min  Stress: Stress Concern Present (09/29/2021)   [place]    Feeling of Stress : To some extent  Social Connections: Moderately Isolated (09/29/2021)   Social Connection and Isolation Panel [NHANES]    Frequency of Communication with Friends and Family: More than three times a week    Frequency of Social Gatherings with Friends and Family: More than three times a week    Attends Religious Services: More than 4 times per year    Active Member of [employer] or Organizations: No    Attends Archivist Meetings: Never    Marital Status: Divorced   Allergies  Allergen Reactions   Celexa [Citalopram Hydrobromide]     psychosis   Cephalosporins     REACTION: tongue swelling   Clarithromycin     REACTION: blisters in mouth   Doxycycline Hyclate     REACTION: nausea, vomiting   Levofloxacin     REACTION: tongue swells   Penicillins     REACTION: per patient causes rash,hives   Rosuvastatin Other (See  Comments)    Pt reports causes bilateral lower extremity muscle aches.    Zolpidem Tartrate     REACTION: difficulty with concentration   Family History  Problem Relation Age of Onset   Heart disease Mother    Hypertension Mother    Diabetes Mother    Dementia Mother    COPD Father    Dementia Maternal Grandmother    Colon cancer Neg Hx      Current  Outpatient Medications (Cardiovascular):    amLODipine (NORVASC) 10 MG tablet, Take 1 tablet (10 mg total) by mouth daily.   Bempedoic Acid-Ezetimibe (NEXLIZET) 180-10 MG TABS, Take 180 mg by mouth daily.   carvedilol (COREG) 6.25 MG tablet, Take 1 tablet (6.25 mg total) by mouth 2 (two) times daily.   irbesartan (AVAPRO) 300 MG tablet, Take 1 tablet (300 mg total) by mouth daily.   omega-3 acid ethyl esters (LOVAZA) 1 g capsule, TAKE TWO CAPSULES EVERY DAY  Current Outpatient Medications (Respiratory):    albuterol (VENTOLIN HFA) 108 (90 Base) MCG/ACT inhaler, Inhale 2 puffs into the lungs every 6 (six) hours as needed for wheezing or shortness of breath.   budesonide-formoterol (SYMBICORT) 160-4.5 MCG/ACT inhaler, Inhale 2 puffs into the lungs 2 (two) times daily.   levocetirizine (XYZAL) 5 MG tablet, Take 1 tablet (5 mg total) by mouth every morning.  Current Outpatient Medications (Analgesics):    acetaminophen (TYLENOL) 325 MG tablet, Take 650 mg by mouth every 6 (six) hours as needed.   aspirin EC 81 MG tablet, Take 1 tablet (81 mg total) by mouth daily. Swallow whole.   Current Outpatient Medications (Other):    ALPRAZolam (XANAX) 1 MG tablet, TAKE ONE TABLET AT BEDTIME AS NEEDED FOR SLEEP -DO NOT DRIVE FOR 8 HOURS AFTER TAKING   anastrozole (ARIMIDEX) 1 MG tablet, Take 1 tablet (1 mg total) by mouth daily.   famotidine (PEPCID) 20 MG tablet, TAKE ONE TABLET TWICE DAILY   venlafaxine XR (EFFEXOR-XR) 75 MG 24 hr capsule, TAKE ONE CAPSULE EACH DAY WITH BREAKFAST   Reviewed prior external information including notes and  imaging from  primary care provider As well as notes that were available from care everywhere and other healthcare systems.  Past medical history, social, surgical and family history all reviewed in electronic medical record.  No pertanent information unless stated regarding to the chief complaint.   Review of Systems:  No headache, visual changes, nausea, vomiting, diarrhea, constipation, dizziness, abdominal pain, skin rash, fevers, chills, night sweats, weight loss, swollen lymph nodes, body aches, joint swelling, chest pain, shortness of breath, mood changes. POSITIVE muscle aches  Objective  Blood pressure 138/86, pulse 98, height 5' (1.524 m), weight 164 lb (74.4 kg), SpO2 98 %.   General: No apparent distress alert and oriented x3 mood and affect normal, dressed appropriately.  HEENT: Pupils equal, extraocular movements intact  Respiratory: Patient's speak in full sentences and does not appear short of breath  Cardiovascular: No lower extremity edema, non tender, no erythema  Low back does have loss of lordosis.  Patient does have poor core strength noted.  Some increasing in weakness of the hip abductors bilaterally.  Tightness with FABER test bilaterally.   Bilateral knee exam shows the patient does have arthritic changes.  No crepitus noted.  No tenderness over the medial joint line bilaterally.  After informed written and verbal consent, patient was seated on exam table. Right knee was prepped with alcohol swab and utilizing anterolateral approach, patient's right knee space was injected with 4:1  marcaine 0.5%: Kenalog '40mg'$ /dL. Patient tolerated the procedure well without immediate complications.  After informed written and verbal consent, patient was seated on exam table. Left knee was prepped with alcohol swab and utilizing anterolateral approach, patient's left knee space was injected with 4:1  marcaine 0.5%: Kenalog '40mg'$ /dL. Patient tolerated the procedure well without  immediate complications.  Osteopathic findings  T7 extended rotated and side bent left L3 flexed rotated and side bent  right Sacrum right on right    Impression and Recommendations:    The above documentation has been reviewed and is accurate and complete [name], DO

## 2022-07-03 NOTE — Assessment & Plan Note (Signed)
Bilateral injections given today and tolerated the procedure well, discussed icing regimen and home exercises.  Increase activity slowly.  Follow-up again in 10 weeks.  Patient wants to avoid any type of surgical intervention at this time.

## 2022-07-03 NOTE — Patient Instructions (Addendum)
Injections in both knees today hopefully they'll work for long time So wonderful to see you

## 2022-07-03 NOTE — Assessment & Plan Note (Signed)
Chronic problem with exacerbation likely secondary to patient's compensating for her knee somewhat.  Has responded well to osteopathic manipulation previously and hopefully will respond again well.  Follow-up again in 6 to 8 weeks.

## 2022-07-07 ENCOUNTER — Inpatient Hospital Stay: Payer: Medicare Other | Admitting: Adult Health

## 2022-07-09 ENCOUNTER — Other Ambulatory Visit: Payer: Self-pay | Admitting: *Deleted

## 2022-07-09 MED ORDER — ANASTROZOLE 1 MG PO TABS: 1.0000 mg | ORAL_TABLET | Freq: Every day | ORAL | 3 refills | Status: DC

## 2022-07-09 NOTE — Progress Notes (Deleted)
Cardiology Office Note:    Date:  07/09/2022   ID:  Felicia Perry, Felicia Perry 10-Nov-1945, MRN [id number]  PCP:  Marin Olp, MD   [facility] Providers Cardiologist:  [name], MD {   Referring MD: Marin Olp, MD    History of Present Illness:    Felicia Perry is a 76 y.o. female with a hx of CAD (dx 2017 - RCA CTO s/p complex PCI, 60% LCx disease, normal LVEF 08/14/16),  chronic diastolic CHF, carotid artery disease, HLD with statin intolerance, HTN, PUD, asthma, anxiety who was previously followed by Dr. Meda Coffee who now presents to clinic for follow-up of CAD and CHF.  Per review of the record, the patient was seen by Dr. Meda Coffee on 08/12/16 for evaluation of chest pain and a positive stress test which was ordered by PCP. This demonstrated a hypotensive response to exercise and a medium defect of moderate severity present in the basal inferoseptal, mid inferoseptal and apical septal and mid and apical inferior location that was reversible and consistent with ischemia.  LHC on 08/14/16 showed RCA CTO s/p complex PCI, 60% LCX disease managed medically with recs to consider staged PCI if continued symptoms. Myoview  01/2017 was low risk study. TTE 01/2017 showed LVEF of 55-60%, elevated filling pressure, mild to moderate AI.  Last myoview 02/15/20 with EF 71%, mild defect in apex but no ischemia. TTE 02/15/20 with LVEF 60-65%, severe LVH, mild MR, aortic sclerosis. Carotid 06/27/20 with RICA 4-65% and LICA 03-54%.  Was last seen in clinic on 12/24/21 where she was doing well from a CV standpoint. Carotid ultrasound 06/2022 with 65-68% in the LICA and 1-27% RICA.   Today, ***  Past Medical History:  Diagnosis Date   Adenomatous polyp 11/23/2005   Anxiety    01/09/22- patient denies anxiety   Asthma 2003   Basal cell carcinoma 2021   right cheek per patient at [place] [facility]   Carotid stenosis    Chronic diastolic CHF (congestive heart failure) ([place]) 2017   Echo  2021 was normal   COPD (chronic obstructive pulmonary disease) ([place])    Coronary artery disease 2017   a. HC on 08/14/16 showed RCA CTO s/p PCI, 60% LCX disease managed medically with recs to consider staged PCI if continued symptoms.    Depression 2008   pt reports MD diagnosed but she does not feel depressed   Diabetes mellitus without complication ([place])    DIVERTICULOSIS, COLON 04/22/2007        GERD (gastroesophageal reflux disease) 2012   History of radiation therapy    Right breast- 03/04/22-04/02/22- Dr. Gery Pray   History of shingles    Hypertension 2008   Hypertensive heart disease    Insomnia 2003   Internal hemorrhoids    Osteoarthritis 2008   "knees, hands, lower back" (08/14/2016)   Pap smear for cervical cancer screening 2002   results normal   PUD (peptic ulcer disease) 1989   Seasonal allergies 2013   Sleep apnea    a. resolved with weight loss    Past Surgical History:  Procedure Laterality Date   BREAST BIOPSY Right 12/30/2021   BREAST EXCISIONAL BIOPSY Left    1980's x 3 - all benign   BREAST EXCISIONAL BIOPSY Right    1980's x 1 or 2 - all benign   BREAST LUMPECTOMY WITH RADIOACTIVE SEED LOCALIZATION Right 01/22/2022   Procedure: RIGHT BREAST LUMPECTOMY WITH RADIOACTIVE SEED LOCALIZATION;  Surgeon: Erroll Luna, MD;  Location: [facility];  Service: General;  Laterality: Right;   BREAST SURGERY Left 1980s   Fibrous Tumors removed    CARDIAC CATHETERIZATION N/A 08/14/2016   Procedure: Left Heart Cath and Coronary Angiography;  Surgeon: Sherren Mocha, MD;  Location: [facility];  Service: Cardiovascular;  Laterality: N/A;   CARDIAC CATHETERIZATION N/A 08/14/2016   Procedure: Coronary Stent Intervention;  Surgeon: Sherren Mocha, MD;  Location: [facility];  Service: Cardiovascular;  Laterality: N/A;   CATARACT EXTRACTION W/ INTRAOCULAR LENS  IMPLANT, BILATERAL Bilateral 05/2009   COLONOSCOPY N/A 2016   Cologuard 2019    CORONARY ANGIOPLASTY     detached retina left eye     may 2019   TUBAL LIGATION  1970s    Current Medications: No outpatient medications have been marked as taking for the 07/22/22 encounter (Appointment) with [name], MD.     Allergies:   Celexa [citalopram hydrobromide], Cephalosporins, Clarithromycin, Doxycycline hyclate, Levofloxacin, Penicillins, Rosuvastatin, and Zolpidem tartrate   Social History   Socioeconomic History   Marital status: Divorced    Spouse name: Not on file   Number of children: Not on file   Years of education: Not on file   Highest education level: Not on file  Occupational History   Occupation: Cleaning  Tobacco Use   Smoking status: Former    Packs/day: 1.00    Years: 26.00    Total pack years: 26.00    Types: Cigarettes    Quit date: 1989    Years since quitting: 34.7   Smokeless tobacco: Never  Vaping Use   Vaping Use: Never used  Substance and Sexual Activity   Alcohol use: No   Drug use: No   Sexual activity: Not Currently  Other Topics Concern   Not on file  Social History Narrative   Divorced for 68 years in 2016. 2 sons- 1 son had been in prison now living with her in 2019.  2 granddaughters with 1 living close.        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 Best friend [name] patient of Dr. Yong Channel. Enjoys work, time on Teaching laboratory technician.    Social Determinants of Health   Financial Resource Strain: Low Risk  (01/07/2022)   Overall Financial Resource Strain (CARDIA)    Difficulty of Paying Living Expenses: Not very hard  Food Insecurity: No Food Insecurity (01/07/2022)   Hunger Vital Sign    Worried About Running Out of Food in the Last Year: Never true    Ran Out of Food in the Last Year: Never true  Transportation Needs: No Transportation Needs (01/07/2022)   PRAPARE - Armed forces logistics/support/administrative officer (Medical): No    Lack of Transportation (Non-Medical): No  Physical Activity: Sufficiently Active (09/29/2021)   Exercise Vital Sign    Days of Exercise per Week: 2 days    Minutes of Exercise per Session: 120 min  Stress: Stress Concern Present (09/29/2021)   [place]    Feeling of Stress : To some extent  Social Connections: Moderately Isolated (09/29/2021)   Social Connection and Isolation Panel [NHANES]    Frequency of Communication with Friends and Family: More than three times a week    Frequency of Social Gatherings with Friends and Family:  More than three times a week    Attends Religious Services: More than 4 times per year    Active Member of Clubs or Organizations: No    Attends Archivist Meetings: Never    Marital Status: Divorced     Family History: The patient's family history includes COPD in her father; Dementia in her maternal grandmother and mother; Diabetes in her mother; Heart disease in her mother; Hypertension in her mother. There is no history of Colon cancer.  ROS:   Please see the history of present illness.    Review of Systems  Constitutional:  Negative for chills, fever and weight loss.  HENT:  Negative for hearing loss.   Eyes:  Negative for blurred vision.  Respiratory:  Negative for shortness of breath.   Cardiovascular:  Negative for chest pain, palpitations, orthopnea, claudication, leg swelling and PND.  Gastrointestinal:  Negative for nausea and vomiting.  Genitourinary:  Negative for hematuria.  Musculoskeletal:  Negative for falls.  Neurological:  Negative for dizziness and loss of consciousness.  Endo/Heme/Allergies:  Negative for environmental allergies.  Psychiatric/Behavioral:  The patient is not nervous/anxious and does not have insomnia.    All other systems reviewed and negative  EKGs/Labs/Other Studies Reviewed:    The following  studies were reviewed today: Carotids 06/26/22: Summary:  Right Carotid: Velocities in the right ICA are consistent with a 1-39%  stenosis.   Left Carotid: Velocities in the left ICA are consistent with a 60-79%  stenosis.                Non-hemodynamically significant plaque <50% noted in the  CCA. The                ECA appears >50% stenosed.   Vertebrals:  Bilateral vertebral arteries demonstrate antegrade flow.  Subclavians: Normal flow hemodynamics were seen in bilateral subclavian               arteries.  Carotids 06/27/21 Right Carotid: Velocities in the right ICA are consistent with a 1-39%  stenosis.   Left Carotid: Velocities in the left ICA are consistent with a 40-59%  stenosis. Non-hemodynamically significant plaque <50% noted in the  CCA.   Vertebrals:  Bilateral vertebral arteries demonstrate antegrade flow.  Subclavians: Normal flow hemodynamics were seen in bilateral subclavian arteries.  CTA Head/Neck 01/02/21 IMPRESSION: 1. No intracranial arterial occlusion or high-grade stenosis. 2. 60 % stenosis of the proximal left internal carotid artery secondary to predominantly calcified atherosclerosis.   Aortic Atherosclerosis (ICD10-I70.0).  Myoview 02/15/20: 08/12/16 for evaluation of chest pain and a positive stress test which was ordered by PCP. This demonstrated a hypotensive response to exercise and a medium defect of moderate severity present in the basal inferoseptal, mid inferoseptal and apical septal and mid and apical inferior location that was reversible and consistent with ischemia.  LHC on 08/14/16 showed RCA CTO s/p complex PCI, 60% LCX disease managed medically with recs to consider staged PCI if continued symptoms. Last stress test 01/2017 was low risk study. Echo 01/2017 showed LVEF of 55-60%, elevated filling pressure, mild to moderate AI.  TTE 02/15/20: IMPRESSIONS   1. Left ventricular ejection fraction, by estimation, is 60 to 65%. The  left  ventricle has normal function. The left ventricle has no regional  wall motion abnormalities. The left ventricular internal cavity size was  mildly dilated. There is severe left ventricular hypertrophy. Left ventricular diastolic parameters were normal.   2. Right ventricular systolic function is normal.  The right ventricular  size is normal. There is mildly elevated pulmonary artery systolic  pressure.   3. The mitral valve is normal in structure. Mild mitral valve  regurgitation. No evidence of mitral stenosis.   4. The aortic valve is tricuspid. Aortic valve regurgitation is mild.  Sclerosis with small gradient but no stenosis.   5. The inferior vena cava is normal in size with greater than 50%  respiratory variability, suggesting right atrial pressure of 3 mmHg.   Carotid 06/27/20: Summary:  Right Carotid: Velocities in the right ICA are consistent with a 1-39%  stenosis.   Left Carotid: Velocities in the left ICA are consistent with a 40-59%  stenosis. Non-hemodynamically significant plaque <50% noted in the  CCA.   Vertebrals:  Bilateral vertebral arteries demonstrate antegrade flow.   Subclavians: Normal flow hemodynamics were seen in bilateral subclavian arteries.   EKG:  EKG was not ordered today  Recent Labs: 04/08/2022: ALT 33; BUN 19; Creatinine, Ser 0.80; Hemoglobin 14.1; Platelets 313.0; Potassium 3.6; Sodium 140  Recent Lipid Panel    Component Value Date/Time   CHOL 116 12/09/2021 0954   CHOL 100 06/22/2019 0907   TRIG 217.0 (H) 12/09/2021 0954   HDL 32.90 (L) 12/09/2021 0954   HDL 32 (L) 06/22/2019 0907   CHOLHDL 4 12/09/2021 0954   VLDL 43.4 (H) 12/09/2021 0954   LDLCALC 48 08/07/2020 0845   LDLDIRECT 53.0 12/09/2021 0954     Risk Assessment/Calculations:       Physical Exam:    VS:  There were no vitals taken for this visit.    Wt Readings from Last 3 Encounters:  07/03/22 164 lb (74.4 kg)  05/04/22 162 lb (73.5 kg)  04/08/22 162 lb (73.5 kg)      GEN:  Well nourished, well developed in no acute distress HEENT: Normal NECK: No JVD; No carotid bruits CARDIAC: RRR, no murmurs, rubs, gallops RESPIRATORY:  Clear to auscultation without rales, wheezing or rhonchi  ABDOMEN: Soft, non-tender, non-distended MUSCULOSKELETAL:  No edema; No deformity  SKIN: Warm and dry NEUROLOGIC:  Alert and oriented x 3 PSYCHIATRIC:  Normal affect   ASSESSMENT:    No diagnosis found.   PLAN:    In order of problems listed above:  #Coronary Artery Disease: S/p LHC in 2017 with RCA CTO s/p complex PCI, 60% LCx disease. TTE 01/2020 with LVEF 60-65%. Myoview with apical infarct but no ischemia. No current anginal symptoms. Remains active -Continue ASA '81mg'$  daily  -Continue irbesartan '300mg'$  daily -Continue coreg 6.'25mg'$  BID  -Intolerant to statins; current on nexlizet 180-'10mg'$  daily; LDL well controlled in 50s -Continue lifestyle modifications as detailed below  #Chronic Diastolic Heart Failure: Euvolemic with NYHA class II symptoms. TTE with LVEF 60-65%, severe LVH. -Not on diuretics -Declined starting farxiga and spironolactone -Continue irbesartan '300mg'$  daily -Continue coreg 6.'25mg'$  BID as above -Low Na diet  #HLD: #Statin Intolerance: LDL at goal in 50s.  -Continue nexlizet 180-'10mg'$  daily  #Carotid Artery Disease: LICA with 40-98%, RICA with 1-39% 06/2022. Will continue serial monitoring.  -Continue nexlizet 180-'10mg'$  daily -On ASA '81mg'$  daily  -Repeat carotid ultrasound 06/2023  #HTN: Controlled with goal <120s/80s -Continue irbesartan '300mg'$  daily -Continue coreg 6.'25mg'$  BID -Continue amlodipine '10mg'$  daily  #Obesity with BMI 34: -Continue lifestyle modifications as detailed below  Exercise recommendations: Goal of exercising for at least 30 minutes a day, at least 5 times per week.  Please exercise to a moderate exertion.  This means that while exercising it is difficult to  speak in full sentences, however you are not so short  of breath that you feel you must stop, and not so comfortable that you can carry on a full conversation.  Exertion level should be approximately a 5/10, if 10 is the most exertion you can perform.  Diet recommendations: Recommend a heart healthy diet such as the Mediterranean diet.  This diet consists of plant based foods, healthy fats, lean meats, olive oil.  It suggests limiting the intake of simple carbohydrates such as white breads, pastries, and pastas.  It also limits the amount of red meat, wine, and dairy products such as cheese that one should consume on a daily basis.     Medication Adjustments/Labs and Tests Ordered: Current medicines are reviewed at length with the patient today.  Concerns regarding medicines are outlined above.  No orders of the defined types were placed in this encounter.  No orders of the defined types were placed in this encounter.   There are no Patient Instructions on file for this visit.  I,Mykaella Javier,acting as a scribe for [name], MD.,have documented all relevant documentation on the behalf of [name], MD,as directed by  [name], MD while in the presence of [name], MD.  I, [name], MD, have reviewed all documentation for this visit. The documentation on 07/09/22 for the exam, diagnosis, procedures, and orders are all accurate and complete.   Signed, [name], MD  07/09/2022 8:18 PM    [place]

## 2022-07-20 ENCOUNTER — Encounter: Payer: Self-pay | Admitting: *Deleted

## 2022-07-22 ENCOUNTER — Ambulatory Visit: Payer: Medicare Other | Admitting: Cardiology

## 2022-07-23 NOTE — Progress Notes (Unsigned)
Cardiology Office Note:    Date:  07/23/2022   ID:  Reather, Steller 02-19-1946, MRN [id number]  PCP:  Marin Olp, MD   [facility] Providers Cardiologist:  [name], MD {   Referring MD: Marin Olp, MD    History of Present Illness:    Felicia Perry is a 76 y.o. female with a hx of CAD (dx 2017 - RCA CTO s/p complex PCI, 60% LCx disease, normal LVEF 08/14/16),  chronic diastolic CHF, carotid artery disease, HLD with statin intolerance, HTN, PUD, asthma, anxiety who was previously followed by Dr. Meda Coffee who now presents to clinic for follow-up of CAD and CHF.  Per review of the record, the patient was seen by Dr. Meda Coffee on 08/12/16 for evaluation of chest pain and a positive stress test which was ordered by PCP. This demonstrated a hypotensive response to exercise and a medium defect of moderate severity present in the basal inferoseptal, mid inferoseptal and apical septal and mid and apical inferior location that was reversible and consistent with ischemia.  LHC on 08/14/16 showed RCA CTO s/p complex PCI, 60% LCX disease managed medically with recs to consider staged PCI if continued symptoms. Myoview  01/2017 was low risk study. TTE 01/2017 showed LVEF of 55-60%, elevated filling pressure, mild to moderate AI.  Last myoview 02/15/20 with EF 71%, mild defect in apex but no ischemia. TTE 02/15/20 with LVEF 60-65%, severe LVH, mild MR, aortic sclerosis. Carotid 06/27/20 with RICA 9-67% and LICA 59-16%.  Was last seen in clinic on 12/24/21 where she was doing well from a CV standpoint. Carotid ultrasound 06/2022 with 38-46% in the LICA and 6-59% RICA.   Today, ***  Past Medical History:  Diagnosis Date   Adenomatous polyp 11/23/2005   Anxiety    01/09/22- patient denies anxiety   Asthma 2003   Basal cell carcinoma 2021   right cheek per patient at [place] [facility]   Carotid stenosis    Chronic diastolic CHF (congestive heart failure) ([place]) 2017   Echo  2021 was normal   COPD (chronic obstructive pulmonary disease) ([place])    Coronary artery disease 2017   a. HC on 08/14/16 showed RCA CTO s/p PCI, 60% LCX disease managed medically with recs to consider staged PCI if continued symptoms.    Depression 2008   pt reports MD diagnosed but she does not feel depressed   Diabetes mellitus without complication ([place])    DIVERTICULOSIS, COLON 04/22/2007        GERD (gastroesophageal reflux disease) 2012   History of radiation therapy    Right breast- 03/04/22-04/02/22- Dr. Gery Pray   History of shingles    Hypertension 2008   Hypertensive heart disease    Insomnia 2003   Internal hemorrhoids    Osteoarthritis 2008   "knees, hands, lower back" (08/14/2016)   Pap smear for cervical cancer screening 2002   results normal   PUD (peptic ulcer disease) 1989   Seasonal allergies 2013   Sleep apnea    a. resolved with weight loss    Past Surgical History:  Procedure Laterality Date   BREAST BIOPSY Right 12/30/2021   BREAST EXCISIONAL BIOPSY Left    1980's x 3 - all benign   BREAST EXCISIONAL BIOPSY Right    1980's x 1 or 2 - all benign   BREAST LUMPECTOMY WITH RADIOACTIVE SEED LOCALIZATION Right 01/22/2022   Procedure: RIGHT BREAST LUMPECTOMY WITH RADIOACTIVE SEED LOCALIZATION;  Surgeon: Erroll Luna, MD;  Location: [facility];  Service: General;  Laterality: Right;   BREAST SURGERY Left 1980s   Fibrous Tumors removed    CARDIAC CATHETERIZATION N/A 08/14/2016   Procedure: Left Heart Cath and Coronary Angiography;  Surgeon: Sherren Mocha, MD;  Location: [facility];  Service: Cardiovascular;  Laterality: N/A;   CARDIAC CATHETERIZATION N/A 08/14/2016   Procedure: Coronary Stent Intervention;  Surgeon: Sherren Mocha, MD;  Location: [facility];  Service: Cardiovascular;  Laterality: N/A;   CATARACT EXTRACTION W/ INTRAOCULAR LENS  IMPLANT, BILATERAL Bilateral 05/2009   COLONOSCOPY N/A 2016   Cologuard 2019    CORONARY ANGIOPLASTY     detached retina left eye     may 2019   TUBAL LIGATION  1970s    Current Medications: No outpatient medications have been marked as taking for the 07/29/22 encounter (Appointment) with [name], MD.     Allergies:   Celexa [citalopram hydrobromide], Cephalosporins, Clarithromycin, Doxycycline hyclate, Levofloxacin, Penicillins, Rosuvastatin, and Zolpidem tartrate   Social History   Socioeconomic History   Marital status: Divorced    Spouse name: Not on file   Number of children: Not on file   Years of education: Not on file   Highest education level: Not on file  Occupational History   Occupation: Cleaning  Tobacco Use   Smoking status: Former    Packs/day: 1.00    Years: 26.00    Total pack years: 26.00    Types: Cigarettes    Quit date: 1989    Years since quitting: 34.7   Smokeless tobacco: Never  Vaping Use   Vaping Use: Never used  Substance and Sexual Activity   Alcohol use: No   Drug use: No   Sexual activity: Not Currently  Other Topics Concern   Not on file  Social History Narrative   Divorced for 61 years in 2016. 2 sons- 1 son had been in prison now living with her in 2019.  2 granddaughters with 1 living close.        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 Best friend [name] patient of Dr. Yong Channel. Enjoys work, time on Teaching laboratory technician.    Social Determinants of Health   Financial Resource Strain: Low Risk  (01/07/2022)   Overall Financial Resource Strain (CARDIA)    Difficulty of Paying Living Expenses: Not very hard  Food Insecurity: No Food Insecurity (01/07/2022)   Hunger Vital Sign    Worried About Running Out of Food in the Last Year: Never true    Ran Out of Food in the Last Year: Never true  Transportation Needs: No Transportation Needs (01/07/2022)   PRAPARE - Armed forces logistics/support/administrative officer (Medical): No    Lack of Transportation (Non-Medical): No  Physical Activity: Sufficiently Active (09/29/2021)   Exercise Vital Sign    Days of Exercise per Week: 2 days    Minutes of Exercise per Session: 120 min  Stress: Stress Concern Present (09/29/2021)   [place]    Feeling of Stress : To some extent  Social Connections: Moderately Isolated (09/29/2021)   Social Connection and Isolation Panel [NHANES]    Frequency of Communication with Friends and Family: More than three times a week    Frequency of Social Gatherings with Friends and Family:  More than three times a week    Attends Religious Services: More than 4 times per year    Active Member of Clubs or Organizations: No    Attends Archivist Meetings: Never    Marital Status: Divorced     Family History: The patient's family history includes COPD in her father; Dementia in her maternal grandmother and mother; Diabetes in her mother; Heart disease in her mother; Hypertension in her mother. There is no history of Colon cancer.  ROS:   Please see the history of present illness.    Review of Systems  Constitutional:  Negative for chills, fever and weight loss.  HENT:  Negative for hearing loss.   Eyes:  Negative for blurred vision.  Respiratory:  Negative for shortness of breath.   Cardiovascular:  Negative for chest pain, palpitations, orthopnea, claudication, leg swelling and PND.  Gastrointestinal:  Negative for nausea and vomiting.  Genitourinary:  Negative for hematuria.  Musculoskeletal:  Negative for falls.  Neurological:  Negative for dizziness and loss of consciousness.  Endo/Heme/Allergies:  Negative for environmental allergies.  Psychiatric/Behavioral:  The patient is not nervous/anxious and does not have insomnia.    All other systems reviewed and negative  EKGs/Labs/Other Studies Reviewed:    The following  studies were reviewed today: Carotids 06/26/22: Summary:  Right Carotid: Velocities in the right ICA are consistent with a 1-39%  stenosis.   Left Carotid: Velocities in the left ICA are consistent with a 60-79%  stenosis.                Non-hemodynamically significant plaque <50% noted in the  CCA. The                ECA appears >50% stenosed.   Vertebrals:  Bilateral vertebral arteries demonstrate antegrade flow.  Subclavians: Normal flow hemodynamics were seen in bilateral subclavian               arteries.  Carotids 06/27/21 Right Carotid: Velocities in the right ICA are consistent with a 1-39%  stenosis.   Left Carotid: Velocities in the left ICA are consistent with a 40-59%  stenosis. Non-hemodynamically significant plaque <50% noted in the  CCA.   Vertebrals:  Bilateral vertebral arteries demonstrate antegrade flow.  Subclavians: Normal flow hemodynamics were seen in bilateral subclavian arteries.  CTA Head/Neck 01/02/21 IMPRESSION: 1. No intracranial arterial occlusion or high-grade stenosis. 2. 60 % stenosis of the proximal left internal carotid artery secondary to predominantly calcified atherosclerosis.   Aortic Atherosclerosis (ICD10-I70.0).  Myoview 02/15/20: 08/12/16 for evaluation of chest pain and a positive stress test which was ordered by PCP. This demonstrated a hypotensive response to exercise and a medium defect of moderate severity present in the basal inferoseptal, mid inferoseptal and apical septal and mid and apical inferior location that was reversible and consistent with ischemia.  LHC on 08/14/16 showed RCA CTO s/p complex PCI, 60% LCX disease managed medically with recs to consider staged PCI if continued symptoms. Last stress test 01/2017 was low risk study. Echo 01/2017 showed LVEF of 55-60%, elevated filling pressure, mild to moderate AI.  TTE 02/15/20: IMPRESSIONS   1. Left ventricular ejection fraction, by estimation, is 60 to 65%. The  left  ventricle has normal function. The left ventricle has no regional  wall motion abnormalities. The left ventricular internal cavity size was  mildly dilated. There is severe left ventricular hypertrophy. Left ventricular diastolic parameters were normal.   2. Right ventricular systolic function is normal.  The right ventricular  size is normal. There is mildly elevated pulmonary artery systolic  pressure.   3. The mitral valve is normal in structure. Mild mitral valve  regurgitation. No evidence of mitral stenosis.   4. The aortic valve is tricuspid. Aortic valve regurgitation is mild.  Sclerosis with small gradient but no stenosis.   5. The inferior vena cava is normal in size with greater than 50%  respiratory variability, suggesting right atrial pressure of 3 mmHg.   Carotid 06/27/20: Summary:  Right Carotid: Velocities in the right ICA are consistent with a 1-39%  stenosis.   Left Carotid: Velocities in the left ICA are consistent with a 40-59%  stenosis. Non-hemodynamically significant plaque <50% noted in the  CCA.   Vertebrals:  Bilateral vertebral arteries demonstrate antegrade flow.   Subclavians: Normal flow hemodynamics were seen in bilateral subclavian arteries.   EKG:  EKG was not ordered today  Recent Labs: 04/08/2022: ALT 33; BUN 19; Creatinine, Ser 0.80; Hemoglobin 14.1; Platelets 313.0; Potassium 3.6; Sodium 140  Recent Lipid Panel    Component Value Date/Time   CHOL 116 12/09/2021 0954   CHOL 100 06/22/2019 0907   TRIG 217.0 (H) 12/09/2021 0954   HDL 32.90 (L) 12/09/2021 0954   HDL 32 (L) 06/22/2019 0907   CHOLHDL 4 12/09/2021 0954   VLDL 43.4 (H) 12/09/2021 0954   LDLCALC 48 08/07/2020 0845   LDLDIRECT 53.0 12/09/2021 0954     Risk Assessment/Calculations:       Physical Exam:    VS:  There were no vitals taken for this visit.    Wt Readings from Last 3 Encounters:  07/03/22 164 lb (74.4 kg)  05/04/22 162 lb (73.5 kg)  04/08/22 162 lb (73.5 kg)      GEN:  Well nourished, well developed in no acute distress HEENT: Normal NECK: No JVD; No carotid bruits CARDIAC: RRR, no murmurs, rubs, gallops RESPIRATORY:  Clear to auscultation without rales, wheezing or rhonchi  ABDOMEN: Soft, non-tender, non-distended MUSCULOSKELETAL:  No edema; No deformity  SKIN: Warm and dry NEUROLOGIC:  Alert and oriented x 3 PSYCHIATRIC:  Normal affect   ASSESSMENT:    No diagnosis found.   PLAN:    In order of problems listed above:  #Coronary Artery Disease: S/p LHC in 2017 with RCA CTO s/p complex PCI, 60% LCx disease. TTE 01/2020 with LVEF 60-65%. Myoview with apical infarct but no ischemia. No current anginal symptoms. Remains active -Continue ASA '81mg'$  daily  -Continue irbesartan '300mg'$  daily -Continue coreg 6.'25mg'$  BID  -Intolerant to statins; current on nexlizet 180-'10mg'$  daily; LDL well controlled in 50s -Continue lifestyle modifications as detailed below  #Chronic Diastolic Heart Failure: Euvolemic with NYHA class II symptoms. TTE with LVEF 60-65%, severe LVH. -Not on diuretics -Declined starting farxiga and spironolactone -Continue irbesartan '300mg'$  daily -Continue coreg 6.'25mg'$  BID as above -Low Na diet  #HLD: #Statin Intolerance: LDL at goal in 50s.  -Continue nexlizet 180-'10mg'$  daily  #Carotid Artery Disease: LICA with 29-52%, RICA with 1-39% 06/2022. Will continue serial monitoring.  -Continue nexlizet 180-'10mg'$  daily -On ASA '81mg'$  daily  -Repeat carotid ultrasound 06/2023  #HTN: Very elevated 150-160/70-80s. -Continue irbesartan '300mg'$  daily -Continue coreg 6.'25mg'$  BID -Continue amlodipine '10mg'$  daily  #Obesity with BMI 32: -Continue lifestyle modifications as detailed below  Exercise recommendations: Goal of exercising for at least 30 minutes a day, at least 5 times per week.  Please exercise to a moderate exertion.  This means that while exercising it is difficult to speak  in full sentences, however you are not so short of  breath that you feel you must stop, and not so comfortable that you can carry on a full conversation.  Exertion level should be approximately a 5/10, if 10 is the most exertion you can perform.  Diet recommendations: Recommend a heart healthy diet such as the Mediterranean diet.  This diet consists of plant based foods, healthy fats, lean meats, olive oil.  It suggests limiting the intake of simple carbohydrates such as white breads, pastries, and pastas.  It also limits the amount of red meat, wine, and dairy products such as cheese that one should consume on a daily basis.     Medication Adjustments/Labs and Tests Ordered: Current medicines are reviewed at length with the patient today.  Concerns regarding medicines are outlined above.  No orders of the defined types were placed in this encounter.  No orders of the defined types were placed in this encounter.   There are no Patient Instructions on file for this visit.  I,Mykaella Javier,acting as a scribe for [name], MD.,have documented all relevant documentation on the behalf of [name], MD,as directed by  [name], MD while in the presence of [name], MD.  I, [name], MD, have reviewed all documentation for this visit. The documentation on 07/23/22 for the exam, diagnosis, procedures, and orders are all accurate and complete.   Signed, [name], MD  07/23/2022 2:02 PM    [place]

## 2022-07-28 NOTE — Progress Notes (Signed)
Chronic Care Management Pharmacy Note  08/05/2022 Name:  Felicia Perry MRN:  [id number] DOB:  1946/09/01  Summary: PharmD FU. Patient with increased BP since starting anastrazole which is certainly possible adverse effect of med.  Recently started in spironolactone from cardiology but has not started it yet.  Also has dexa ordered which is important with new med start of anastrazole. A1c is great  Recommendations/Changes made from today's visit: Continue routine A1c follow up  Start taking spironolactone Complete DEXA - treat accordingly  Plan: FU 6 months to assess    Subjective: Felicia Perry is an 76 y.o. year old female who is a primary patient of Hunter, Brayton Mars, MD.  The CCM team was consulted for assistance with disease management and care coordination needs.    Engaged with patient by telephone for follow up visit in response to provider referral for pharmacy case management and/or care coordination services.   Consent to Services:  The patient was given the following information about Chronic Care Management services today, agreed to services, and gave verbal consent: 1. CCM service includes personalized support from designated clinical staff supervised by the primary care provider, including individualized plan of care and coordination with other care providers 2. 24/7 contact phone numbers for assistance for urgent and routine care needs. 3. Service will only be billed when office clinical staff spend 20 minutes or more in a month to coordinate care. 4. Only one practitioner may furnish and bill the service in a calendar month. 5.The patient may stop CCM services at any time (effective at the end of the month) by phone call to the office staff. 6. The patient will be responsible for cost sharing (co-pay) of up to 20% of the service fee (after annual deductible is met). Patient agreed to services and consent obtained.  Patient Care Team: Marin Olp, MD as PCP -  General (Family Medicine) [name], MD as PCP - Cardiology (Cardiology) [name], DO as Consulting Physician (Sports Medicine) Warren Danes, PA-C as Physician Assistant (Dermatology) Edythe Clarity, [facility] as Pharmacist (Pharmacist) Mauro Kaufmann, RN as Oncology Nurse Navigator Rockwell Germany, RN as Oncology Nurse Navigator Erroll Luna, MD as Consulting Physician (General Surgery) [name], MD as Consulting Physician (Hematology and Oncology) Gery Pray, MD as Consulting Physician (Radiation Oncology)  Recent office visits: 04/08/2022 [facility] (PCP) Marin Olp, MD; no medication changes indicated.   Recent consult visits:  04/01/2022 [facility] (Oncology) [name], MD; no medication changes indicated.   Hospital visits:  None in previous 6 months Objective:  Lab Results  Component Value Date   CREATININE 0.80 04/08/2022   BUN 19 04/08/2022   GFR 71.89 04/08/2022   GFRNONAA >60 01/07/2022   GFRAA 74 08/07/2020   NA 140 04/08/2022   K 3.6 04/08/2022   CALCIUM 9.9 04/08/2022   CO2 29 04/08/2022   GLUCOSE 151 (H) 04/08/2022    Lab Results  Component Value Date/Time   HGBA1C 6.4 04/08/2022 11:07 AM   HGBA1C 7.0 (H) 12/09/2021 09:54 AM   GFR 71.89 04/08/2022 11:07 AM   GFR 60.91 12/22/2021 08:02 AM    Last diabetic Eye exam:  Lab Results  Component Value Date/Time   HMDIABEYEEXA No Retinopathy 12/25/2021 12:00 AM    Last diabetic Foot exam: No results found for: "HMDIABFOOTEX"   Lab Results  Component Value Date   CHOL 116 12/09/2021   HDL 32.90 (L) 12/09/2021   LDLCALC 48 08/07/2020  LDLDIRECT 53.0 12/09/2021   TRIG 217.0 (H) 12/09/2021   CHOLHDL 4 12/09/2021       Latest Ref Rng & Units 04/08/2022   11:07 AM 01/07/2022    9:02 AM 12/09/2021    9:54 AM  Hepatic Function  Total Protein 6.0 - 8.3 g/dL 7.7  7.5  7.7   Albumin 3.5 - 5.2 g/dL 4.5  4.3  4.5   AST 0 - 37 U/L 40  13  25   ALT 0 - 35 U/L 33  15  25    Alk Phosphatase 39 - 117 U/L 52  76  60   Total Bilirubin 0.2 - 1.2 mg/dL 1.0  0.9  0.6     Lab Results  Component Value Date/Time   TSH 1.38 08/07/2020 08:45 AM   TSH 1.37 10/20/2016 09:34 AM       Latest Ref Rng & Units 04/08/2022   11:07 AM 01/07/2022    9:02 AM 08/12/2021   11:46 AM  CBC  WBC 4.0 - 10.5 K/uL 8.7  14.5  8.6   Hemoglobin 12.0 - 15.0 g/dL 14.1  14.0  13.7   Hematocrit 36.0 - 46.0 % 42.0  40.7  40.6   Platelets 150.0 - 400.0 K/uL 313.0  359  298.0     Lab Results  Component Value Date/Time   VD25OH 45.47 08/12/2021 11:46 AM   VD25OH 61 08/07/2020 08:45 AM   VD25OH 62.76 07/25/2019 09:17 AM    Clinical ASCVD: Yes  The ASCVD Risk score (Arnett DK, et al., 2019) failed to calculate for the following reasons:   The valid total cholesterol range is 130 to 320 mg/dL       09/29/2021    8:56 AM 08/12/2021   10:44 AM 02/05/2021    7:58 AM  Depression screen PHQ 2/9  Decreased Interest 0 0 0  Down, Depressed, Hopeless 0 0 0  PHQ - 2 Score 0 0 0  Altered sleeping   3  Tired, decreased energy   0  Change in appetite   3  Feeling bad or failure about yourself    0  Trouble concentrating   0  Moving slowly or fidgety/restless   0  Suicidal thoughts   0  PHQ-9 Score   6  Difficult doing work/chores   Not difficult at all    Social History   Tobacco Use  Smoking Status Former   Packs/day: 1.00   Years: 26.00   Total pack years: 26.00   Types: Cigarettes   Quit date: 1989   Years since quitting: 34.7  Smokeless Tobacco Never   BP Readings from Last 3 Encounters:  07/29/22 (!) 142/80  07/03/22 138/86  05/04/22 (!) 153/83   Pulse Readings from Last 3 Encounters:  07/29/22 84  07/03/22 98  05/04/22 74   Wt Readings from Last 3 Encounters:  07/29/22 166 lb (75.3 kg)  07/03/22 164 lb (74.4 kg)  05/04/22 162 lb (73.5 kg)   BMI Readings from Last 3 Encounters:  07/29/22 32.42 kg/m  07/03/22 32.03 kg/m  05/04/22 31.64 kg/m     Assessment/Interventions: Review of patient past medical history, allergies, medications, health status, including review of consultants reports, laboratory and other test data, was performed as part of comprehensive evaluation and provision of chronic care management services.   SDOH:  (Social Determinants of Health) assessments and interventions performed: No, done within the last year SDOH Interventions    Flowsheet Row Clinical Support from 01/07/2022 in [place]  [facility] Office Visit from 08/07/2020 in [place] Office Visit from 01/23/2020 in [place] Visit from 04/12/2018 in [place] Visit from 02/01/2018 in [place] Interventions Intervention Not Indicated -- -- -- --  Housing Interventions Intervention Not Indicated -- -- -- --  Transportation Interventions Intervention Not Indicated -- -- -- --  Depression Interventions/Treatment  -- Medication Medication, Counseling Medication Medication, Currently on Treatment  Financial Strain Interventions Intervention Not Indicated -- -- -- --      Financial Resource Strain: Low Risk  (01/07/2022)   Overall Financial Resource Strain (CARDIA)    Difficulty of Paying Living Expenses: Not very hard    SDOH Screenings   Food Insecurity: No Food Insecurity (01/07/2022)  Housing: [place]  (01/07/2022)  Transportation Needs: No Transportation Needs (01/07/2022)  Depression (PHQ2-9): Low Risk  (09/29/2021)  Financial Resource Strain: Low Risk  (01/07/2022)  Physical Activity: Sufficiently Active (09/29/2021)  Social Connections: Moderately Isolated (09/29/2021)  Stress: Stress Concern Present (09/29/2021)  Tobacco Use: Medium Risk (07/29/2022)    CCM Care Plan  Allergies  Allergen Reactions   Celexa [Citalopram Hydrobromide]     psychosis   Cephalosporins      REACTION: tongue swelling   Clarithromycin     REACTION: blisters in mouth   Doxycycline Hyclate     REACTION: nausea, vomiting   Levofloxacin     REACTION: tongue swells   Penicillins     REACTION: per patient causes rash,hives   Rosuvastatin Other (See Comments)    Pt reports causes bilateral lower extremity muscle aches.    Zolpidem Tartrate     REACTION: difficulty with concentration    Medications Reviewed Today     Reviewed by Edythe Clarity, [facility] (Pharmacist) on 08/04/22 at [place] List Status: <None>   Medication Order Taking? Sig Documenting Provider Last Dose Status Informant  acetaminophen (TYLENOL) 325 MG tablet [id number] Yes Take 650 mg by mouth every 6 (six) hours as needed. [provider] Taking Active Self  albuterol (VENTOLIN HFA) 108 (90 Base) MCG/ACT inhaler [id number] Yes Inhale 2 puffs into the lungs every 6 (six) hours as needed for wheezing or shortness of breath. Marin Olp, MD Taking Active   ALPRAZolam Duanne Moron) 1 MG tablet [id number] Yes TAKE ONE TABLET AT BEDTIME AS NEEDED FOR SLEEP -DO NOT DRIVE FOR 8 HOURS AFTER TAKING Marin Olp, MD Taking Active   amLODipine (NORVASC) 10 MG tablet [id number] Yes Take 1 tablet (10 mg total) by mouth daily. [name], MD Taking Active   anastrozole (ARIMIDEX) 1 MG tablet [id number] Yes Take 1 tablet (1 mg total) by mouth daily. [name], MD Taking Active   aspirin EC 81 MG tablet [id number] Yes Take 1 tablet (81 mg total) by mouth daily. Swallow whole. [name] T, PA-C Taking Active   Bempedoic Acid-Ezetimibe (NEXLIZET) 180-10 MG TABS [id number] Yes Take 180 mg by mouth daily. [name], MD Taking Active   budesonide-formoterol [facility]) 160-4.5 MCG/ACT inhaler [id number] Yes Inhale 2 puffs into the lungs 2 (two) times daily. Marin Olp, MD Taking Active   carvedilol (COREG) 12.5 MG tablet [id number] Yes Take 1 tablet (12.5 mg total) by mouth 2 (two) times daily.  [name], MD Taking Active   famotidine (PEPCID) 20 MG tablet [id number] Yes TAKE ONE TABLET TWICE DAILY Marin Olp,  MD Taking Active   irbesartan (AVAPRO) 300 MG tablet [id number] Yes Take 1 tablet (300 mg total) by mouth daily. [name], MD Taking Active   levocetirizine (XYZAL) 5 MG tablet [id number] Yes Take 1 tablet (5 mg total) by mouth every morning. Marin Olp, MD Taking Active   omega-3 acid ethyl esters (LOVAZA) 1 g capsule [id number] Yes TAKE TWO CAPSULES EVERY DAY [name], MD Taking Active   spironolactone (ALDACTONE) 25 MG tablet [id number] Yes Take 0.5 tablets (12.5 mg total) by mouth daily. [name], MD Taking Active   venlafaxine XR (EFFEXOR-XR) 75 MG 24 hr capsule [id number] Yes TAKE ONE CAPSULE EACH DAY WITH BREAKFAST [name], DO Taking Active             Patient Active Problem List   Diagnosis Date Noted   Genetic testing 01/19/2022   Ductal carcinoma in situ (DCIS) of right breast 01/05/2022   Diabetes mellitus type 2 with complications ([place]) 33/38/3291   Intractable headache 01/02/2021   Greater trochanteric bursitis of left hip 08/13/2020   Senile purpura ([place]) 08/07/2020   statin myalgia 06/25/2020   Carotid stenosis 01/23/2020   Chronic diastolic CHF (congestive heart failure) ([place]) 01/12/2019   Degenerative arthritis of knee, bilateral 12/08/2018   Partial hamstring tear, initial encounter 11/16/2018   Hyperlipidemia, unspecified 07/11/2018   Nonallopathic lesion of sacral region 07/05/2018   Nonallopathic lesion of cervical region 07/05/2018   DDD (degenerative disc disease), cervical 05/04/2018   Degenerative arthritis of left knee 09/15/2017   H/O hyperparathyroidism 08/18/2016   Coronary artery disease of native artery of native heart with stable angina pectoris ([place])    PUD (peptic ulcer disease)    Syncope 08/04/2016   Vitamin D deficiency 08/20/2015   Hyperglycemia 08/06/2015    Xerosis of skin 08/06/2015   Hypersomnia with sleep apnea 03/26/2015   Chronic pain syndrome 03/26/2015   Nonallopathic lesion of lumbosacral region 11/13/2014   Osteoarthritis of left lower extremity 10/17/2014   Chronic meniscal tear of knee 10/17/2014   Nonallopathic lesion of thoracic region 09/14/2014   Nonallopathic lesion-rib cage 08/21/2014   Tendinopathy of right rotator cuff 07/30/2014   Piriformis syndrome of right side 07/30/2014   Insomnia 07/13/2014   Limited joint range of motion 07/13/2014   Rash 07/13/2014   GERD (gastroesophageal reflux disease) 02/25/2011   SI (sacroiliac) joint dysfunction 10/10/2007   LOW BACK PAIN 07/01/2007   Depression 04/22/2007   Essential hypertension 04/22/2007   Asthma 04/22/2007    Immunization History  Administered Date(s) Administered   Fluad Quad(high Dose 65+) 07/25/2019, 08/07/2020, 08/12/2021   Influenza Split 09/21/2011, 07/07/2012   Influenza Whole 08/10/2008, 07/30/2010   Influenza, High Dose Seasonal PF 08/04/2016, 07/05/2018   Influenza,inj,Quad PF,6+ Mos 07/21/2013, 08/06/2015   Influenza-Unspecified 07/10/2014, 10/01/2017   PFIZER(Purple Top)SARS-COV-2 Vaccination 12/26/2019, 01/17/2020   Pneumococcal Conjugate-13 01/05/2014   Pneumococcal Polysaccharide-23 02/08/2015   Td 10/26/2001   Tetanus 01/05/2014    Conditions to be addressed/monitored:  HTN, CHF, CAD, Type II DM, Depression, Insomnia, HLD  Care Plan : General Pharmacy (Adult)  Updates made by Edythe Clarity, RPH since 08/05/2022 12:00 AM     Problem: HTN, CHF, CAD, Type II DM, Depression, Insomnia, HLD   Priority: High  Onset Date: 09/15/2021     Long-Range Goal: Patient-Specific Goal   Start Date: 09/15/2021  Expected End Date: 03/16/2022  Recent Progress: On track  Priority: High  Note:   Current Barriers:  Elevated blood pressure  post breast cancer surgery  Pharmacist Clinical Goal(s):  Patient will achieve improvement in A1c as  evidenced by screening through collaboration with PharmD and provider.   Interventions: 1:1 collaboration with Marin Olp, MD regarding development and update of comprehensive plan of care as evidenced by provider attestation and co-signature Inter-disciplinary care team collaboration (see longitudinal plan of care) Comprehensive medication review performed; medication list updated in electronic medical record  Hypertension (BP goal <130/80) 08/04/22 -Controlled -Current treatment: Irbesartan 340m daily Appropriate, Effective, Safe, Accessible Amlodipine 170mdaily Appropriate, Effective, Safe, Accessible Carvedilol 12.109m58mID Appropriate, Effective, Safe, Accessible Spironolactone 2109m46me-half tablet daily Appropriate, Query effective, ,  -Medications previously tried: metoprolol, nadolol, hydralazine -Current home readings: 155/79 P 72 this morning -Current dietary habits: same unchanged -Current exercise habits: minimal lately - previously loved to ride bicycle.  Her knee pain is the barrier for her to exercise like she was in the past.  -Denies hypotensive/hypertensive symptoms -Educated on BP goals and benefits of medications for prevention of heart attack, stroke and kidney damage; Exercise goal of 150 minutes per week; Importance of home blood pressure monitoring; Symptoms of hypotension and importance of maintaining adequate hydration; -She was prescribed spironolactone recently after visit with cardiology but she has no been to pick up the medication yet.  Counseled on medication and benefits on BP. -She attributes increase to when she started anastrazole which is certainly a possibility. Plan to start spironolactone and assess efficacy before any changes.   Update 01/27/22 Recently had irbesartan dose cut in half due to bump in Creatinine.  Once she returned for CMP her kidney function has normalized.  She has continued on 150mg8mly and her BP at home has been all <  130/80. She denies any dizziness or HA.  She was concerned on if she needed to increase back to the 300mg.31ml consult with PCP - if BP is where it is at home I would recommend staying at the lower dose for now.  Lifestyle mods and weight loss will continue to improve BP as well.  Hyperlipidemia: (LDL goal < 70) -Not ideally controlled, not assessed -Current treatment: Nexlizet 180-10mg d6109m Lovaza 1g two capsules daily -Medications previously tried: statins (myalgais)  -Current dietary patterns: see DM -Current exercise habits: see HTN -Educated on Cholesterol goals;  Importance of limiting foods high in cholesterol; Exercise goal of 150 minutes per week; -Recommended to continue current medication LDL close to goal < 70. Increased exercise and diet changes should improve.  Getting through [employer] just approved.  Will be getting through this for the next year.  Diabetes (A1c goal <7%) -Uncontrolled, not assessed -Current medications: None at this time -Medications previously tried: none noted  -Current home glucose readings fasting glucose: not checking post prandial glucose: not checking -Denies hypoglycemic/hyperglycemic symptoms -Current meal patterns: she was eating meals high in carbohydrates.  Some days she cooks and some days she will get take out depending on how busy she is.  Currently living at her home with son and his fiance.  They will pick up fast food for her occasionally. drinks: water -Current exercise: minimal - [employer] a few times weekly -Educated on A1c and blood sugar goals; Exercise goal of 150 minutes per week; Benefits of weight loss; Carbohydrate counting and/or plate method -Counseled to check feet daily and get yearly eye exams -Continue current management, lots of time spent today discussing diet (implications of carbs in blood sugar).  She really wants to fix  this with diet and exercise.  Encouraged her this was  possible with lifestyle mods.  Will follow closely to keep her on track. If meds needed in future would recommend Farxiga due to heart benefits.  Update 01/27/22 Most recent A1c decreased to 7.0!  She has made great lifestyle changes - has not had soda in > 6 months.  She is eating less carbs and sweets, doing more cooking at home instead of eating out and fast food. She reports about 9 lbs of weight loss. She plans to continue these lifestyle mods - she feels better and is starting to enjoy eating this type of food. Continue current management and routine screenings. Will continue to follow and keep on track.  Heart Failure (Goal: manage symptoms and prevent exacerbations) -Controlled, not assessed -Last ejection fraction: >65% -HF type: Diastolic -Current treatment: Carvedilol 12.34m BID -Medications previously tried: metoprolol  -Current home BP/HR readings: normal -Current dietary habits: see DM -Current exercise habits: see DM -Educated on Benefits of medications for managing symptoms and prolonging life Importance of weighing daily; if you gain more than 3 pounds in one day or 5 pounds in one week, contact providers Importance of blood pressure control -Counseled on diet and exercise extensively Recommended to continue current medication Denies any swelling  Depression/Anxiety (Goal: Minimize symptoms) -Not ideally controlled, not assessed -Current treatment: Venlafaxine ER 717mdaily -Medications previously tried/failed: Wellbutrin, Celexa -PHQ9:  PHQ9 SCORE ONLY 08/12/2021 02/05/2021 08/16/2020  PHQ-9 Total Score 0 6 0  -GAD7: No flowsheet data found.  -Educated on Benefits of medication for symptom control Need to be in right mental frame of mind in order to take care of herself and make lifestyle mods.  Patient admits today that she may be a little depressed.  She does not feel like Effexor is working for her anymore and does not want to increase dose.  She reports  stopping this med once and had horrible symptoms but she did not have anything else to replace it.  Based on her history the only ones she has not tried would be Lexapro, Zoloft, and Cymbalta. 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 -Will consult with PCP on next steps to treat depression, I feel if we can get this under control se may be more likely to make lifestyle mods to improve DM.  Breast Cancer (Goal: Continued remission) 08/04/22 -Controlled -Current treatment  Anastrazole 1 mg daily Appropriate, Effective, Safe, Accessible -Medications previously tried: none noted  -Started medication in December and noticed her BP creeping up ever since.  This certainly could be related to this medication but need to weigh the pros and cons of this medication.She also has a DEXA ordered which I would encourage because this medication comes with increased fracture risk. Order already placed just needs to schedule.  Patient Goals/Self-Care Activities Patient will:  - take medications as prescribed as evidenced by patient report and record review focus on medication adherence by own home system target a minimum of 150 minutes of moderate intensity exercise weekly Work to limit carbohydrates in diet  Follow Up Plan: The care management team will reach out to the patient again over the next 180 days.                Medication Assistance: None required.  Patient affirms current coverage meets needs.  Compliance/Adherence/Medication fill history: Care Gaps: Cologuard   Star Meds: Irbesartan 30023m7/24/23 90ds  Patient's preferred pharmacy is:  [facility]  [address] [place] [address] Phone: [phone] Fax: [phone]   Uses pill box? No - own method of organization in cabinet Pt endorses 100% compliance  We discussed: Benefits of medication synchronization, packaging and delivery as well as enhanced pharmacist  oversight with [employer]. Patient decided to: Continue current medication management strategy  Care Plan and Follow Up Patient Decision:  Patient agrees to Care Plan and Follow-up.  Plan: The care management team will reach out to the patient again over the next 90 days.  Beverly Milch, PharmD Clinical Pharmacist  [facility] [phone]

## 2022-07-29 ENCOUNTER — Encounter: Payer: Self-pay | Admitting: Cardiology

## 2022-07-29 ENCOUNTER — Ambulatory Visit: Payer: Medicare Other | Attending: Cardiology | Admitting: Cardiology

## 2022-07-29 VITALS — BP 142/80 | HR 84 | Ht 60.0 in | Wt 166.0 lb

## 2022-07-29 DIAGNOSIS — I5032 Chronic diastolic (congestive) heart failure: Secondary | ICD-10-CM

## 2022-07-29 DIAGNOSIS — I6523 Occlusion and stenosis of bilateral carotid arteries: Secondary | ICD-10-CM

## 2022-07-29 DIAGNOSIS — Z79899 Other long term (current) drug therapy: Secondary | ICD-10-CM

## 2022-07-29 DIAGNOSIS — Z789 Other specified health status: Secondary | ICD-10-CM

## 2022-07-29 DIAGNOSIS — E782 Mixed hyperlipidemia: Secondary | ICD-10-CM

## 2022-07-29 DIAGNOSIS — I25119 Atherosclerotic heart disease of native coronary artery with unspecified angina pectoris: Secondary | ICD-10-CM | POA: Diagnosis not present

## 2022-07-29 DIAGNOSIS — R0609 Other forms of dyspnea: Secondary | ICD-10-CM

## 2022-07-29 DIAGNOSIS — I10 Essential (primary) hypertension: Secondary | ICD-10-CM | POA: Diagnosis not present

## 2022-07-29 DIAGNOSIS — E669 Obesity, unspecified: Secondary | ICD-10-CM

## 2022-07-29 DIAGNOSIS — R0602 Shortness of breath: Secondary | ICD-10-CM | POA: Diagnosis not present

## 2022-07-29 MED ORDER — SPIRONOLACTONE 25 MG PO TABS: 12.5000 mg | ORAL_TABLET | Freq: Every day | ORAL | 3 refills | Status: DC

## 2022-07-29 MED ORDER — CARVEDILOL 12.5 MG PO TABS: 12.5000 mg | ORAL_TABLET | Freq: Two times a day (BID) | ORAL | 3 refills | Status: DC

## 2022-07-29 NOTE — Patient Instructions (Signed)
Medication Instructions:   INCREASE YOUR CARVEDILOL TO 12.5 MG BY MOUTH TWICE DAILY  START TAKING SPIRONOLACTONE 12.5 MG BY MOUTH DAILY  *If you need a refill on your cardiac medications before your next appointment, please call your pharmacy*   Lab Work:  [facility] OFFICE--BMET  If you have labs (blood work) drawn today and your tests are completely normal, you will receive your results only by: [facility] (if you have MyChart) OR A paper copy in the mail If you have any lab test that is abnormal or we need to change your treatment, we will call you to review the results.   Testing/Procedures:  How to Prepare for Your Cardiac PET/CT Stress Test:  1. Please do not take these medications before your test:   Medications that may interfere with the cardiac pharmacological stress agent (ex. nitrates - including erectile dysfunction medications or beta-blockers) the day of the exam. (Erectile dysfunction medication should be held for at least 72 hrs prior to test) HOLD CARVEDILOL THE MORNING OF THIS TEST Your remaining medications may be taken with water.  2. Nothing to eat or drink, except water, 3 hours prior to arrival time.   NO caffeine/decaffeinated products, or chocolate 12 hours prior to arrival.  3. NO perfume, cologne or lotion  4. Total time is 1 to 2 hours; you may want to bring reading material for the waiting time.  5. Please report to Admitting at the [facility] 60 minutes early for your test.  [place], [place] [address]  IF YOU THINK YOU MAY BE PREGNANT, OR ARE NURSING PLEASE INFORM THE TECHNOLOGIST.  In preparation for your appointment, medication and supplies will be purchased.  Appointment availability is limited, so if you need to cancel or reschedule, please call the Radiology Department at [phone]  24 hours in advance to avoid a cancellation fee of $100.00  What to Expect After you  Arrive:  Once you arrive and check in for your appointment, you will be taken to a preparation room within the Radiology Department.  A technologist or Nurse will obtain your medical history, verify that you are correctly prepped for the exam, and explain the procedure.  Afterwards,  an IV will be started in your arm and electrodes will be placed on your skin for EKG monitoring during the stress portion of the exam. Then you will be escorted to the PET/CT scanner.  There, staff will get you positioned on the scanner and obtain a blood pressure and EKG.  During the exam, you will continue to be connected to the EKG and blood pressure machines.  A small, safe amount of a radioactive tracer will be injected in your IV to obtain a series of pictures of your heart along with an injection of a stress agent.    After your Exam:  It is recommended that you eat a meal and drink a caffeinated beverage to counter act any effects of the stress agent.  Drink plenty of fluids for the remainder of the day and urinate frequently for the first couple of hours after the exam.  Your doctor will inform you of your test results within 7-10 business days.  For questions about your test or how to prepare for your test, please call: Marchia Bond, Cardiac Imaging Nurse Navigator  Gordy Clement, Cardiac Imaging Nurse Navigator Office: [phone]    Follow-Up:  1.) WITH PHARMACIST IN [facility] AT NEXT AVAILABLE APPOINTMENT--PATIENT  IS ALREADY ESTABLISHED WITH THEM, JUST NEEDS AN APPOINTMENT  2.) IN 3 MONTHS WITH ERNEST DICK NP

## 2022-07-29 NOTE — Progress Notes (Signed)
Cardiology Office Note:    Date:  07/29/2022   ID:  Brodi, Nery [birth year], MRN [id number]  PCP:  Marin Olp, MD   [facility] Providers Cardiologist:  [name], MD {   Referring MD: Marin Olp, MD    History of Present Illness:    Felicia Perry is a 76 y.o. female with a hx of CAD (dx 2017 - RCA CTO s/p complex PCI, 60% LCx disease, normal LVEF 08/14/16),  chronic diastolic CHF, carotid artery disease, HLD with statin intolerance, HTN, PUD, asthma, anxiety who was previously followed by Dr. Meda Coffee who now presents to clinic for follow-up of CAD and CHF.  Per review of the record, the patient was seen by Dr. Meda Coffee on 08/12/16 for evaluation of chest pain and a positive stress test which was ordered by PCP. This demonstrated a hypotensive response to exercise and a medium defect of moderate severity present in the basal inferoseptal, mid inferoseptal and apical septal and mid and apical inferior location that was reversible and consistent with ischemia.  LHC on 08/14/16 showed RCA CTO s/p complex PCI, 60% LCX disease managed medically with recs to consider staged PCI if continued symptoms. Myoview  01/2017 was low risk study. TTE 01/2017 showed LVEF of 55-60%, elevated filling pressure, mild to moderate AI.  Last myoview 02/15/20 with EF 71%, mild defect in apex but no ischemia. TTE 02/15/20 with LVEF 60-65%, severe LVH, mild MR, aortic sclerosis. Carotid 06/27/20 with RICA 8-31% and LICA 51-76%.  Was last seen in clinic on 12/24/21 where she was doing well from a CV standpoint. Carotid ultrasound 06/2022 with 16-07% in the LICA and 3-71% RICA.   Today, the patient states that she continues to struggle with severe knee pain that prevents her from completing her usual exercise routines. She would usually ride her bike in the park daily, and would enjoy playing golf and tennis if she was able. Currently she is working as a Electrical engineer, which she is able to  complete for 6 hours without significant symptoms. However, she is short of breath when walking to the mailbox and back, or with walking from the car to this office, which is concerning to her. She denies any exertional chest pain. Notable, her blood pressure has been very elevated which may be contributing to her symptoms. On average she has blood pressure readings of 155-160/79-85 at home. In clinic her BP is 168/82 (142/80 on recheck). Her blood pressure has been gradually increasing since her right breast lumpectomy 12/2021.  States she thinks it is going up due to pain.  She endorses peripheral edema in her bilateral hands. Additionally she complains of bothersome ecchymosis of her bilateral arms. This has been ongoing since DAPT following her PCI. She is on ASA only at this time.  Regarding her diet she only drinks water, coffee, and Boost. She may have 1 glass of wine every 2-3 months. In the mornings she drinks a Boost shake. When she comes home she has a light meal such as tuna. Her largest meal is dinner, but she makes sure it is a balanced meal.  She denies any palpitations, lightheadedness, syncope, orthopnea, or PND.   Past Medical History:  Diagnosis Date   Adenomatous polyp 11/23/2005   Anxiety    01/09/22- patient denies anxiety   Asthma 2003   Basal cell carcinoma 2021   right cheek per patient at [place] [facility]   Carotid stenosis    Chronic diastolic CHF (  congestive heart failure) ([place]) 2017   Echo 2021 was normal   COPD (chronic obstructive pulmonary disease) ([place])    Coronary artery disease 2017   a. HC on 08/14/16 showed RCA CTO s/p PCI, 60% LCX disease managed medically with recs to consider staged PCI if continued symptoms.    Depression 2008   pt reports MD diagnosed but she does not feel depressed   Diabetes mellitus without complication ([place])    DIVERTICULOSIS, COLON 04/22/2007        GERD (gastroesophageal reflux disease) 2012   History of radiation therapy     Right breast- 03/04/22-04/02/22- Dr. Gery Pray   History of shingles    Hypertension 2008   Hypertensive heart disease    Insomnia 2003   Internal hemorrhoids    Osteoarthritis 2008   "knees, hands, lower back" (08/14/2016)   Pap smear for cervical cancer screening 2002   results normal   PUD (peptic ulcer disease) 1989   Seasonal allergies 2013   Sleep apnea    a. resolved with weight loss    Past Surgical History:  Procedure Laterality Date   BREAST BIOPSY Right 12/30/2021   BREAST EXCISIONAL BIOPSY Left    1980's x 3 - all benign   BREAST EXCISIONAL BIOPSY Right    1980's x 1 or 2 - all benign   BREAST LUMPECTOMY WITH RADIOACTIVE SEED LOCALIZATION Right 01/22/2022   Procedure: RIGHT BREAST LUMPECTOMY WITH RADIOACTIVE SEED LOCALIZATION;  Surgeon: Erroll Luna, MD;  Location: [facility];  Service: General;  Laterality: Right;   BREAST SURGERY Left 1980s   Fibrous Tumors removed    CARDIAC CATHETERIZATION N/A 08/14/2016   Procedure: Left Heart Cath and Coronary Angiography;  Surgeon: Sherren Mocha, MD;  Location: [facility];  Service: Cardiovascular;  Laterality: N/A;   CARDIAC CATHETERIZATION N/A 08/14/2016   Procedure: Coronary Stent Intervention;  Surgeon: Sherren Mocha, MD;  Location: [facility];  Service: Cardiovascular;  Laterality: N/A;   CATARACT EXTRACTION W/ INTRAOCULAR LENS  IMPLANT, BILATERAL Bilateral 05/2009   COLONOSCOPY N/A 2016   Cologuard 2019   CORONARY ANGIOPLASTY     detached retina left eye     may 2019   TUBAL LIGATION  1970s    Current Medications: Current Meds  Medication Sig   acetaminophen (TYLENOL) 325 MG tablet Take 650 mg by mouth every 6 (six) hours as needed.   albuterol (VENTOLIN HFA) 108 (90 Base) MCG/ACT inhaler Inhale 2 puffs into the lungs every 6 (six) hours as needed for wheezing or shortness of breath.   ALPRAZolam (XANAX) 1 MG tablet TAKE ONE TABLET AT BEDTIME AS NEEDED FOR SLEEP -DO NOT  DRIVE FOR 8 HOURS AFTER TAKING   amLODipine (NORVASC) 10 MG tablet Take 1 tablet (10 mg total) by mouth daily.   anastrozole (ARIMIDEX) 1 MG tablet Take 1 tablet (1 mg total) by mouth daily.   aspirin EC 81 MG tablet Take 1 tablet (81 mg total) by mouth daily. Swallow whole.   Bempedoic Acid-Ezetimibe (NEXLIZET) 180-10 MG TABS Take 180 mg by mouth daily.   budesonide-formoterol (SYMBICORT) 160-4.5 MCG/ACT inhaler Inhale 2 puffs into the lungs 2 (two) times daily.   carvedilol (COREG) 12.5 MG tablet Take 1 tablet (12.5 mg total) by mouth 2 (two) times daily.   famotidine (PEPCID) 20 MG tablet TAKE ONE TABLET TWICE DAILY   irbesartan (AVAPRO) 300 MG tablet Take 1 tablet (300 mg total) by mouth daily.   levocetirizine (XYZAL) 5 MG  tablet Take 1 tablet (5 mg total) by mouth every morning.   omega-3 acid ethyl esters (LOVAZA) 1 g capsule TAKE TWO CAPSULES EVERY DAY   spironolactone (ALDACTONE) 25 MG tablet Take 0.5 tablets (12.5 mg total) by mouth daily.   venlafaxine XR (EFFEXOR-XR) 75 MG 24 hr capsule TAKE ONE CAPSULE EACH DAY WITH BREAKFAST   [DISCONTINUED] carvedilol (COREG) 6.25 MG tablet Take 1 tablet (6.25 mg total) by mouth 2 (two) times daily.     Allergies:   Celexa [citalopram hydrobromide], Cephalosporins, Clarithromycin, Doxycycline hyclate, Levofloxacin, Penicillins, Rosuvastatin, and Zolpidem tartrate   Social History   Socioeconomic History   Marital status: Divorced    Spouse name: Not on file   Number of children: Not on file   Years of education: Not on file   Highest education level: Not on file  Occupational History   Occupation: Cleaning  Tobacco Use   Smoking status: Former    Packs/day: 1.00    Years: 26.00    Total pack years: 26.00    Types: Cigarettes    Quit date: 1989    Years since quitting: 34.7   Smokeless tobacco: Never  Vaping Use   Vaping Use: Never used  Substance and Sexual Activity   Alcohol use: No   Drug use: No   Sexual activity: Not  Currently  Other Topics Concern   Not on file  Social History Narrative   Divorced for 6 years in 2016. 2 sons- 1 son had been in prison now living with her in 2019.  2 granddaughters with 1 living close.        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 Best friend [name] patient of Dr. Yong Channel. Enjoys work, time on Teaching laboratory technician.    Social Determinants of Health   Financial Resource Strain: Low Risk  (01/07/2022)   Overall Financial Resource Strain (CARDIA)    Difficulty of Paying Living Expenses: Not very hard  Food Insecurity: No Food Insecurity (01/07/2022)   Hunger Vital Sign    Worried About Running Out of Food in the Last Year: Never true    Ran Out of Food in the Last Year: Never true  Transportation Needs: No Transportation Needs (01/07/2022)   PRAPARE - Hydrologist (Medical): No    Lack of Transportation (Non-Medical): No  Physical Activity: Sufficiently Active (09/29/2021)   Exercise Vital Sign    Days of Exercise per Week: 2 days    Minutes of Exercise per Session: 120 min  Stress: Stress Concern Present (09/29/2021)   [place]    Feeling of Stress : To some extent  Social Connections: Moderately Isolated (09/29/2021)   Social Connection and Isolation Panel [NHANES]    Frequency of Communication with Friends and Family: More than three times a week    Frequency of Social Gatherings with Friends and Family: More than three times a week    Attends Religious Services: More than 4 times per year    Active Member of [employer] or Organizations: No    Attends Music therapist: Never    Marital Status: Divorced     Family History: The patient's family history includes COPD in her father; Dementia in her maternal grandmother and mother; Diabetes  in her mother; Heart disease in her  mother; Hypertension in her mother. There is no history of Colon cancer.  ROS:   Please see the history of present illness.    Review of Systems  Constitutional:  Negative for chills, fever and weight loss.  HENT:  Negative for hearing loss.   Eyes:  Negative for blurred vision.  Respiratory:  Positive for shortness of breath.   Cardiovascular:  Positive for chest pain. Negative for palpitations, orthopnea, claudication, leg swelling and PND.  Gastrointestinal:  Negative for nausea and vomiting.  Genitourinary:  Negative for hematuria.  Musculoskeletal:  Positive for joint pain (Bilateral knees). Negative for falls.  Neurological:  Positive for headaches. Negative for dizziness and loss of consciousness.  Endo/Heme/Allergies:  Negative for environmental allergies.  Psychiatric/Behavioral:  The patient is not nervous/anxious and does not have insomnia.    All other systems reviewed and negative  EKGs/Labs/Other Studies Reviewed:    The following studies were reviewed today:  Carotids 06/26/22: Summary:  Right Carotid: Velocities in the right ICA are consistent with a 1-39%  stenosis.   Left Carotid: Velocities in the left ICA are consistent with a 60-79%  stenosis.                Non-hemodynamically significant plaque <50% noted in the  CCA. The                ECA appears >50% stenosed.   Vertebrals:  Bilateral vertebral arteries demonstrate antegrade flow.  Subclavians: Normal flow hemodynamics were seen in bilateral subclavian               arteries.   Carotids 06/27/21 Right Carotid: Velocities in the right ICA are consistent with a 1-39%  stenosis.   Left Carotid: Velocities in the left ICA are consistent with a 40-59%  stenosis. Non-hemodynamically significant plaque <50% noted in the  CCA.   Vertebrals:  Bilateral vertebral arteries demonstrate antegrade flow.  Subclavians: Normal flow hemodynamics were seen in bilateral subclavian  arteries.  CTA Head/Neck 01/02/21 IMPRESSION: 1. No intracranial arterial occlusion or high-grade stenosis. 2. 60 % stenosis of the proximal left internal carotid artery secondary to predominantly calcified atherosclerosis.   Aortic Atherosclerosis (ICD10-I70.0).  Myoview 02/15/20: 08/12/16 for evaluation of chest pain and a positive stress test which was ordered by PCP. This demonstrated a hypotensive response to exercise and a medium defect of moderate severity present in the basal inferoseptal, mid inferoseptal and apical septal and mid and apical inferior location that was reversible and consistent with ischemia.  LHC on 08/14/16 showed RCA CTO s/p complex PCI, 60% LCX disease managed medically with recs to consider staged PCI if continued symptoms. Last stress test 01/2017 was low risk study. Echo 01/2017 showed LVEF of 55-60%, elevated filling pressure, mild to moderate AI.  TTE 02/15/20: IMPRESSIONS   1. Left ventricular ejection fraction, by estimation, is 60 to 65%. The  left ventricle has normal function. The left ventricle has no regional  wall motion abnormalities. The left ventricular internal cavity size was  mildly dilated. There is severe left ventricular hypertrophy. Left ventricular diastolic parameters were normal.   2. Right ventricular systolic function is normal. The right ventricular  size is normal. There is mildly elevated pulmonary artery systolic  pressure.   3. The mitral valve is normal in structure. Mild mitral valve  regurgitation. No evidence of mitral stenosis.   4. The aortic valve is tricuspid. Aortic valve regurgitation is mild.  Sclerosis with small gradient but no stenosis.   5. The  inferior vena cava is normal in size with greater than 50%  respiratory variability, suggesting right atrial pressure of 3 mmHg.   Carotid 06/27/20: Summary:  Right Carotid: Velocities in the right ICA are consistent with a 1-39%  stenosis.   Left Carotid: Velocities in  the left ICA are consistent with a 40-59%  stenosis. Non-hemodynamically significant plaque <50% noted in the  CCA.   Vertebrals:  Bilateral vertebral arteries demonstrate antegrade flow.   Subclavians: Normal flow hemodynamics were seen in bilateral subclavian arteries.   EKG:  EKG is personally reviewed. 07/29/2022:  EKG was not ordered. 01/15/2022:  NSR at 66 bpm.  Recent Labs: 04/08/2022: ALT 33; BUN 19; Creatinine, Ser 0.80; Hemoglobin 14.1; Platelets 313.0; Potassium 3.6; Sodium 140   Recent Lipid Panel    Component Value Date/Time   CHOL 116 12/09/2021 0954   CHOL 100 06/22/2019 0907   TRIG 217.0 (H) 12/09/2021 0954   HDL 32.90 (L) 12/09/2021 0954   HDL 32 (L) 06/22/2019 0907   CHOLHDL 4 12/09/2021 0954   VLDL 43.4 (H) 12/09/2021 0954   LDLCALC 48 08/07/2020 0845   LDLDIRECT 53.0 12/09/2021 0954     Risk Assessment/Calculations:       Physical Exam:    VS:  BP (!) 142/80 (BP Location: Left Arm, Patient Position: Sitting, Cuff Size: Normal)   Pulse 84   Ht 5' (1.524 m)   Wt 166 lb (75.3 kg)   SpO2 97%   BMI 32.42 kg/m     Wt Readings from Last 3 Encounters:  07/29/22 166 lb (75.3 kg)  07/03/22 164 lb (74.4 kg)  05/04/22 162 lb (73.5 kg)     GEN:  Well nourished, well developed in no acute distress HEENT: Normal NECK: No JVD; No carotid bruits CARDIAC: RRR, no murmurs, rubs, gallops RESPIRATORY:  Clear to auscultation without rales, wheezing or rhonchi  ABDOMEN: Soft, non-tender, non-distended MUSCULOSKELETAL:  No edema; No deformity  SKIN: Warm and dry NEUROLOGIC:  Alert and oriented x 3 PSYCHIATRIC:  Normal affect   ASSESSMENT:    1. Coronary artery disease involving native coronary artery of native heart with angina pectoris ([place])   2. Shortness of breath   3. DOE (dyspnea on exertion)   4. Essential hypertension   5. Chronic heart failure with preserved ejection fraction (HCC)   6. Medication management   7. Mixed hyperlipidemia   8. Obesity  (BMI 30-39.9)   9. Chronic diastolic CHF (congestive heart failure) ([place])   10. Statin intolerance   11. Bilateral carotid artery stenosis      PLAN:    In order of problems listed above:  #Coronary Artery Disease: #DOE: S/p LHC in 2017 with RCA CTO s/p complex PCI, 60% LCx disease. TTE 01/2020 with LVEF 60-65%. Myoview with apical infarct but no ischemia. Now with dypsnea on exertion which may be related to poorly controlled hypertension, but given known Lcx disease, will check NM PET for further evaluation. -Check NM PET for further evaluation -BP management as below -Continue ASA '81mg'$  daily  -Continue irbesartan '300mg'$  daily -Increase coreg to 12.'5mg'$  BID -Intolerant to statins; current on nexlizet 180-'10mg'$  daily; LDL well controlled in 50s -Continue lifestyle modifications as detailed below  #HTN: Very elevated 150-160/70-80s. Has been running high at home. Likely some element related to pain but merits up-titration of medications at this time. -Continue irbesartan '300mg'$  daily -Increase coreg to 12.'5mg'$  BID -Continue amlodipine '10mg'$  daily -Start spironolactone '25mg'$  daily -BMET next week -Follow-up with Pharm D   #  Chronic Diastolic Heart Failure: Euvolemic with NYHA class II symptoms. TTE with LVEF 60-65%, severe LVH. -Not on diuretics -Continue irbesartan '300mg'$  daily -Increase coreg to 12.'5mg'$  BID -Start spiro '25mg'$  daily -BMET next week -Low Na diet   #HLD: #Statin Intolerance: LDL at goal in 50s.  -Continue nexlizet 180-'10mg'$  daily   #Carotid Artery Disease: LICA with 44-01%, RICA with 1-39% 06/2022. Will continue serial monitoring.  -Continue nexlizet 180-'10mg'$  daily -On ASA '81mg'$  daily  -Repeat carotid ultrasound 06/2023  #Obesity with BMI 32: -Continue lifestyle modifications as detailed below   Exercise recommendations: Goal of exercising for at least 30 minutes a day, at least 5 times per week.  Please exercise to a moderate exertion.  This means that while  exercising it is difficult to speak in full sentences, however you are not so short of breath that you feel you must stop, and not so comfortable that you can carry on a full conversation.  Exertion level should be approximately a 5/10, if 10 is the most exertion you can perform.  Diet recommendations: Recommend a heart healthy diet such as the Mediterranean diet.  This diet consists of plant based foods, healthy fats, lean meats, olive oil.  It suggests limiting the intake of simple carbohydrates such as white breads, pastries, and pastas.  It also limits the amount of red meat, wine, and dairy products such as cheese that one should consume on a daily basis.  Follow-up:  3 months.    Medication Adjustments/Labs and Tests Ordered: Current medicines are reviewed at length with the patient today.  Concerns regarding medicines are outlined above.   Orders Placed This Encounter  Procedures   NM PET CT CARDIAC PERFUSION MULTI W/ABSOLUTE BLOODFLOW   Basic metabolic panel   Meds ordered this encounter  Medications   carvedilol (COREG) 12.5 MG tablet    Sig: Take 1 tablet (12.5 mg total) by mouth 2 (two) times daily.    Dispense:  180 tablet    Refill:  3    DOSE INCREASE   spironolactone (ALDACTONE) 25 MG tablet    Sig: Take 0.5 tablets (12.5 mg total) by mouth daily.    Dispense:  45 tablet    Refill:  3   Patient Instructions  Medication Instructions:   INCREASE YOUR CARVEDILOL TO 12.5 MG BY MOUTH TWICE DAILY  START TAKING SPIRONOLACTONE 12.5 MG BY MOUTH DAILY  *If you need a refill on your cardiac medications before your next appointment, please call your pharmacy*   Lab Work:  [place] OFFICE--BMET  If you have labs (blood work) drawn today and your tests are completely normal, you will receive your results only by: [place] (if you have MyChart) OR A paper copy in the mail If you have any lab test that is abnormal or we need to change your treatment,  we will call you to review the results.   Testing/Procedures:  How to Prepare for Your Cardiac PET/CT Stress Test:  1. Please do not take these medications before your test:   Medications that may interfere with the cardiac pharmacological stress agent (ex. nitrates - including erectile dysfunction medications or beta-blockers) the day of the exam. (Erectile dysfunction medication should be held for at least 72 hrs prior to test) HOLD CARVEDILOL THE MORNING OF THIS TEST Your remaining medications may be taken with water.  2. Nothing to eat or drink, except water, 3 hours prior to arrival time.   NO caffeine/decaffeinated products, or chocolate  12 hours prior to arrival.  3. NO perfume, cologne or lotion  4. Total time is 1 to 2 hours; you may want to bring reading material for the waiting time.  5. Please report to Admitting at the [place] Entrance 60 minutes early for your test.  [place], [place] [address]  IF YOU THINK YOU MAY BE PREGNANT, OR ARE NURSING PLEASE INFORM THE TECHNOLOGIST.  In preparation for your appointment, medication and supplies will be purchased.  Appointment availability is limited, so if you need to cancel or reschedule, please call the Radiology Department at [phone]  24 hours in advance to avoid a cancellation fee of $100.00  What to Expect After you Arrive:  Once you arrive and check in for your appointment, you will be taken to a preparation room within the Radiology Department.  A technologist or Nurse will obtain your medical history, verify that you are correctly prepped for the exam, and explain the procedure.  Afterwards,  an IV will be started in your arm and electrodes will be placed on your skin for EKG monitoring during the stress portion of the exam. Then you will be escorted to the PET/CT scanner.  There, staff will get you positioned on the scanner and obtain a blood pressure and EKG.  During the exam, you  will continue to be connected to the EKG and blood pressure machines.  A small, safe amount of a radioactive tracer will be injected in your IV to obtain a series of pictures of your heart along with an injection of a stress agent.    After your Exam:  It is recommended that you eat a meal and drink a caffeinated beverage to counter act any effects of the stress agent.  Drink plenty of fluids for the remainder of the day and urinate frequently for the first couple of hours after the exam.  Your doctor will inform you of your test results within 7-10 business days.  For questions about your test or how to prepare for your test, please call: Marchia Bond, Cardiac Imaging Nurse Navigator  Gordy Clement, Cardiac Imaging Nurse Navigator Office: [phone]    Follow-Up:  1.) WITH PHARMACIST IN [facility] AT NEXT AVAILABLE APPOINTMENT--PATIENT IS ALREADY ESTABLISHED WITH THEM, JUST NEEDS AN APPOINTMENT  2.) IN 3 MONTHS WITH ERNEST DICK NP        I,Mathew Stumpf,acting as a scribe for [name], MD.,have documented all relevant documentation on the behalf of [name], MD,as directed by  [name], MD while in the presence of [name], MD.  I, [name], MD, have reviewed all documentation for this visit. The documentation on 07/29/22 for the exam, diagnosis, procedures, and orders are all accurate and complete.   Signed, [name], MD  07/29/2022 1:10 PM    [facility]

## 2022-08-04 ENCOUNTER — Ambulatory Visit: Payer: Medicare Other | Admitting: Pharmacist

## 2022-08-04 DIAGNOSIS — I10 Essential (primary) hypertension: Secondary | ICD-10-CM

## 2022-08-04 DIAGNOSIS — D0511 Intraductal carcinoma in situ of right breast: Secondary | ICD-10-CM

## 2022-08-04 NOTE — Patient Instructions (Signed)
Visit Information   Goals Addressed   None    Patient Care Plan: General Pharmacy (Adult)     Problem Identified: HTN, CHF, CAD, Type II DM, Depression, Insomnia, HLD   Priority: High  Onset Date: 09/15/2021     Long-Range Goal: Patient-Specific Goal   Start Date: 09/15/2021  Expected End Date: 03/16/2022  Recent Progress: On track  Priority: High  Note:   Current Barriers:  Fluctuating glucose  Pharmacist Clinical Goal(s):  Patient will achieve improvement in A1c as evidenced by screening through collaboration with PharmD and provider.   Interventions: 1:1 collaboration with Marin Olp, MD regarding development and update of comprehensive plan of care as evidenced by provider attestation and co-signature Inter-disciplinary care team collaboration (see longitudinal plan of care) Comprehensive medication review performed; medication list updated in electronic medical record  Hypertension (BP goal <130/80) -Controlled -Current treatment: Irbesartan '150mg'$  daily Appropriate, Effective, Safe, Accessible Amlodipine '10mg'$  daily Appropriate, Effective, Safe, Accessible Carvedilol 6.'25mg'$  BID Appropriate, Effective, Safe, Accessible -Medications previously tried: metoprolol, nadolol, hydralazine -Current home readings: normal at home -Current dietary habits: depends on the day, some fast food, some home cooked, loves fruits and raw veggies -Current exercise habits: minimal lately - previously loved to ride bicycle.  Has recently started going to [employer] -Denies hypotensive/hypertensive symptoms -Educated on BP goals and benefits of medications for prevention of heart attack, stroke and kidney damage; Exercise goal of 150 minutes per week; Importance of home blood pressure monitoring; Symptoms of hypotension and importance of maintaining adequate hydration; -Counseled to monitor BP at home weekly, document, and provide log at future appointments -Recommended to  continue current medication  Update 01/27/22 Recently had irbesartan dose cut in half due to bump in Creatinine.  Once she returned for CMP her kidney function has normalized.  She has continued on '150mg'$  daily and her BP at home has been all < 130/80. She denies any dizziness or HA.  She was concerned on if she needed to increase back to the '300mg'$ . Will consult with PCP - if BP is where it is at home I would recommend staying at the lower dose for now.  Lifestyle mods and weight loss will continue to improve BP as well.  Hyperlipidemia: (LDL goal < 70) -Not ideally controlled -Current treatment: Nexlizet 180-'10mg'$  daily Lovazq 1g two capsules daily -Medications previously tried: statins (myalgais)  -Current dietary patterns: see DM -Current exercise habits: see HTN -Educated on Cholesterol goals;  Importance of limiting foods high in cholesterol; Exercise goal of 150 minutes per week; -Recommended to continue current medication LDL close to goal < 70. Increased exercise and diet changes should improve.  Getting through [employer], was recently just approved.  Will be getting through this for the next year.  Diabetes (A1c goal <7%) -Uncontrolled -Current medications: None at this time -Medications previously tried: none noted  -Current home glucose readings fasting glucose: not checking post prandial glucose: not checking -Denies hypoglycemic/hyperglycemic symptoms -Current meal patterns: she was eating meals high in carbohydrates.  Some days she cooks and some days she will get take out depending on how busy she is.  Currently living at her home with son and his fiance.  They will pick up fast food for her occasionally. drinks: water -Current exercise: minimal - [employer] a few times weekly -Educated on A1c and blood sugar goals; Exercise goal of 150 minutes per week; Benefits of weight loss; Carbohydrate counting and/or plate method -Counseled to check feet daily and  get  yearly eye exams -Continue current management, lots of time spent today discussing diet (implications of carbs in blood sugar).  She really wants to fix this with diet and exercise.  Encouraged her this was possible with lifestyle mods.  Will follow closely to keep her on track. If meds needed in future would recommend Farxiga due to heart benefits.  Update 01/27/22 Most recent A1c decreased to 7.0!  She has made great lifestyle changes - has not had soda in > 6 months.  She is eating less carbs and sweets, doing more cooking at home instead of eating out and fast food. She reports about 9 lbs of weight loss. She plans to continue these lifestyle mods - she feels better and is starting to enjoy eating this type of food. Continue current management and routine screenings. Will continue to follow and keep on track.  Heart Failure (Goal: manage symptoms and prevent exacerbations) -Controlled -Last ejection fraction: >65% -HF type: Diastolic -Current treatment: Carvedilol 6.'25mg'$  BID -Medications previously tried: metoprolol  -Current home BP/HR readings: normal -Current dietary habits: see DM -Current exercise habits: see DM -Educated on Benefits of medications for managing symptoms and prolonging life Importance of weighing daily; if you gain more than 3 pounds in one day or 5 pounds in one week, contact providers Importance of blood pressure control -Counseled on diet and exercise extensively Recommended to continue current medication Denies any swelling  Depression/Anxiety (Goal: Minimize symptoms) -Not ideally controlled -Current treatment: Venlafaxine ER '75mg'$  daily -Medications previously tried/failed: Wellbutrin, Celexa -PHQ9:  PHQ9 SCORE ONLY 08/12/2021 02/05/2021 08/16/2020  PHQ-9 Total Score 0 6 0  -GAD7: No flowsheet data found.  -Educated on Benefits of medication for symptom control Need to be in right mental frame of mind in order to take care of herself and make  lifestyle mods.  Patient admits today that she may be a little depressed.  She does not feel like Effexor is working for her anymore and does not want to increase dose.  She reports stopping this med once and had horrible symptoms but she did not have anything else to replace it.  Based on her history the only ones she has not tried would be Lexapro, Zoloft, and Cymbalta. 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 -Will consult with PCP on next steps to treat depression, I feel if we can get this under control se may be more likely to make lifestyle mods to improve DM.   Patient Goals/Self-Care Activities Patient will:  - take medications as prescribed as evidenced by patient report and record review focus on medication adherence by own home system target a minimum of 150 minutes of moderate intensity exercise weekly Work to limit carbohydrates in diet  Follow Up Plan: The care management team will reach out to the patient again over the next 90 days.           The patient verbalized understanding of instructions, educational materials, and care plan provided today and DECLINED offer to receive copy of patient instructions, educational materials, and care plan.  Telephone follow up appointment with pharmacy team member scheduled for: 6 months  Edythe Clarity, [place], PharmD Clinical Pharmacist  [facility] [phone]

## 2022-08-05 ENCOUNTER — Ambulatory Visit: Payer: Medicare Other | Attending: Cardiology

## 2022-08-05 DIAGNOSIS — I5032 Chronic diastolic (congestive) heart failure: Secondary | ICD-10-CM

## 2022-08-05 DIAGNOSIS — I10 Essential (primary) hypertension: Secondary | ICD-10-CM

## 2022-08-05 DIAGNOSIS — R0602 Shortness of breath: Secondary | ICD-10-CM | POA: Diagnosis not present

## 2022-08-05 DIAGNOSIS — Z79899 Other long term (current) drug therapy: Secondary | ICD-10-CM

## 2022-08-05 DIAGNOSIS — I25119 Atherosclerotic heart disease of native coronary artery with unspecified angina pectoris: Secondary | ICD-10-CM

## 2022-08-05 DIAGNOSIS — R0609 Other forms of dyspnea: Secondary | ICD-10-CM

## 2022-08-06 ENCOUNTER — Inpatient Hospital Stay: Payer: Medicare Other | Attending: Adult Health | Admitting: Adult Health

## 2022-08-06 ENCOUNTER — Other Ambulatory Visit: Payer: Self-pay

## 2022-08-06 ENCOUNTER — Encounter: Payer: Self-pay | Admitting: Adult Health

## 2022-08-06 VITALS — BP 138/53 | HR 79 | Temp 98.8°F | Resp 18 | Ht 60.0 in | Wt 167.8 lb

## 2022-08-06 DIAGNOSIS — Z79811 Long term (current) use of aromatase inhibitors: Secondary | ICD-10-CM | POA: Diagnosis not present

## 2022-08-06 DIAGNOSIS — D0511 Intraductal carcinoma in situ of right breast: Secondary | ICD-10-CM | POA: Diagnosis not present

## 2022-08-06 DIAGNOSIS — Z87891 Personal history of nicotine dependence: Secondary | ICD-10-CM | POA: Diagnosis not present

## 2022-08-06 DIAGNOSIS — Z79899 Other long term (current) drug therapy: Secondary | ICD-10-CM | POA: Insufficient documentation

## 2022-08-06 DIAGNOSIS — E2839 Other primary ovarian failure: Secondary | ICD-10-CM

## 2022-08-06 LAB — BASIC METABOLIC PANEL
BUN/Creatinine Ratio: 34 — ABNORMAL HIGH (ref 12–28)
BUN: 30 mg/dL — ABNORMAL HIGH (ref 8–27)
CO2: 27 mmol/L (ref 20–29)
Calcium: 9.7 mg/dL (ref 8.7–10.3)
Chloride: 100 mmol/L (ref 96–106)
Creatinine, Ser: 0.88 mg/dL (ref 0.57–1.00)
Glucose: 144 mg/dL — ABNORMAL HIGH (ref 70–99)
Potassium: 4.4 mmol/L (ref 3.5–5.2)
Sodium: 142 mmol/L (ref 134–144)
eGFR: 68 mL/min/{1.73_m2} (ref >59)

## 2022-08-06 MED ORDER — DOXYCYCLINE HYCLATE 100 MG PO TABS: 100.0000 mg | ORAL_TABLET | Freq: Two times a day (BID) | ORAL | 0 refills | Status: DC

## 2022-08-06 MED ORDER — ONDANSETRON 8 MG PO TBDP: 8.0000 mg | ORAL_TABLET | Freq: Three times a day (TID) | ORAL | 0 refills | Status: DC | PRN

## 2022-08-06 NOTE — Progress Notes (Signed)
SURVIVORSHIP VISIT:    BRIEF ONCOLOGIC HISTORY:  Oncology History  Ductal carcinoma in situ (DCIS) of right breast  12/18/2021 Cancer Staging   Staging form: Breast, AJCC 8th Edition - Clinical stage from 12/18/2021: Stage 0 (cTis (DCIS), cN0, cM0, ER+, PR+, HER2: Not Assessed) - Signed by [name], MD on 01/07/2022 Stage prefix: Initial diagnosis Nuclear grade: G2 Laterality: Right Stage used in treatment planning: Yes National guidelines used in treatment planning: Yes Type of national guideline used in treatment planning: NCCN   01/05/2022 Initial Diagnosis   Ductal carcinoma in situ (DCIS) of right breast    Genetic Testing   [facility] CancerNext Panel was Negative. Report date is 01/17/2022.  The CancerNext gene panel offered by [employer] includes sequencing, rearrangement analysis, and RNA analysis for the following 36 genes:   APC, ATM, AXIN2, BARD1, BMPR1A, BRCA1, BRCA2, BRIP1, CDH1, CDK4, CDKN2A, CHEK2, DICER1, HOXB13, EPCAM, GREM1, MLH1, MSH2, MSH3, MSH6, MUTYH, NBN, NF1, NTHL1, PALB2, PMS2, POLD1, POLE, PTEN, RAD51C, RAD51D, RECQL, SMAD4, SMARCA4, STK11, and TP53.    01/22/2022 Surgery   She had right breast lumpectomy pathology showed invasive well-differentiated grade 1 ductal carcinoma, extensive intermediate grade DCIS.  Invasive tumor measured 2 mm in greatest dimension, margins free prognostics on the invasive tumor showed ER +85% moderate staining, progesterone receptor negative, KI of less than 5% and HER2 negative. Oncotype not needed since her tumor is less than 5 mm   01/2022 -  Anti-estrogen oral therapy   Anastrozole   03/04/2022 - 04/02/2022 Radiation Therapy   Site Technique Total Dose (Gy) Dose per Fx (Gy) Completed Fx Beam Energies  Breast, Right: Breast_R 3D 40.05/40.05 2.67 15/15 10X, 6XFFF  Breast, Right: Breast_R_Bst 3D 12/12 2 6/6 6X, 10X       INTERVAL HISTORY:  Felicia Perry to review her survivorship care plan detailing her treatment course for  breast cancer, as well as monitoring long-term side effects of that treatment, education regarding health maintenance, screening, and overall wellness and health promotion.     Overall, Felicia Perry reports feeling quite well.  She is taking anastrozole daily that she started earlier this week and is tolerating it well.  REVIEW OF SYSTEMS:  Review of Systems  Constitutional:  Negative for appetite change, chills, fatigue, fever and unexpected weight change.  HENT:   Negative for hearing loss, lump/mass and trouble swallowing.   Eyes:  Negative for eye problems and icterus.  Respiratory:  Negative for chest tightness, cough and shortness of breath.   Cardiovascular:  Negative for chest pain, leg swelling and palpitations.  Gastrointestinal:  Negative for abdominal distention, abdominal pain, constipation, diarrhea, nausea and vomiting.  Endocrine: Negative for hot flashes.  Genitourinary:  Negative for difficulty urinating.   Musculoskeletal:  Negative for arthralgias.  Skin:  Negative for itching and rash.  Neurological:  Negative for dizziness, extremity weakness, headaches and numbness.  Hematological:  Negative for adenopathy. Does not bruise/bleed easily.  Psychiatric/Behavioral:  Negative for depression. The patient is not nervous/anxious.    Breast: Denies any new nodularity, masses, tenderness, nipple changes, or nipple discharge.   ONCOLOGY TREATMENT TEAM:  1. Surgeon:  Dr. Brantley Stage at [facility] 2. Medical Oncologist: Dr. Chryl Heck  3. Radiation Oncologist: Dr. Sondra Come    PAST MEDICAL/SURGICAL HISTORY:  Past Medical History:  Diagnosis Date   Adenomatous polyp 11/23/2005   Anxiety    01/09/22- patient denies anxiety   Asthma 2003   Basal cell carcinoma 2021   right cheek per patient at  [place] [facility]   Carotid stenosis    Chronic diastolic CHF (congestive heart failure) ([place]) 2017   Echo 2021 was normal   COPD (chronic obstructive pulmonary disease) ([place])     Coronary artery disease 2017   a. HC on 08/14/16 showed RCA CTO s/p PCI, 60% LCX disease managed medically with recs to consider staged PCI if continued symptoms.    Depression 2008   pt reports MD diagnosed but she does not feel depressed   Diabetes mellitus without complication ([place])    DIVERTICULOSIS, COLON 04/22/2007        GERD (gastroesophageal reflux disease) 2012   History of radiation therapy    Right breast- 03/04/22-04/02/22- Dr. Gery Pray   History of shingles    Hypertension 2008   Hypertensive heart disease    Insomnia 2003   Internal hemorrhoids    Osteoarthritis 2008   "knees, hands, lower back" (08/14/2016)   Pap smear for cervical cancer screening 2002   results normal   PUD (peptic ulcer disease) 1989   Seasonal allergies 2013   Sleep apnea    a. resolved with weight loss   Past Surgical History:  Procedure Laterality Date   BREAST BIOPSY Right 12/30/2021   BREAST EXCISIONAL BIOPSY Left    1980's x 3 - all benign   BREAST EXCISIONAL BIOPSY Right    1980's x 1 or 2 - all benign   BREAST LUMPECTOMY WITH RADIOACTIVE SEED LOCALIZATION Right 01/22/2022   Procedure: RIGHT BREAST LUMPECTOMY WITH RADIOACTIVE SEED LOCALIZATION;  Surgeon: Erroll Luna, MD;  Location: [facility];  Service: General;  Laterality: Right;   BREAST SURGERY Left 1980s   Fibrous Tumors removed    CARDIAC CATHETERIZATION N/A 08/14/2016   Procedure: Left Heart Cath and Coronary Angiography;  Surgeon: Sherren Mocha, MD;  Location: [facility] [facility];  Service: Cardiovascular;  Laterality: N/A;   CARDIAC CATHETERIZATION N/A 08/14/2016   Procedure: Coronary Stent Intervention;  Surgeon: Sherren Mocha, MD;  Location: [facility];  Service: Cardiovascular;  Laterality: N/A;   CATARACT EXTRACTION W/ INTRAOCULAR LENS  IMPLANT, BILATERAL Bilateral 05/2009   COLONOSCOPY N/A 2016   Cologuard 2019   CORONARY ANGIOPLASTY     detached retina left eye     may 2019   TUBAL  LIGATION  1970s     ALLERGIES:  Allergies  Allergen Reactions   Celexa [Citalopram Hydrobromide]     psychosis   Cephalosporins     REACTION: tongue swelling   Clarithromycin     REACTION: blisters in mouth   Doxycycline Hyclate     REACTION: nausea, vomiting   Levofloxacin     REACTION: tongue swells   Penicillins     REACTION: per patient causes rash,hives   Rosuvastatin Other (See Comments)    Pt reports causes bilateral lower extremity muscle aches.    Zolpidem Tartrate     REACTION: difficulty with concentration     CURRENT MEDICATIONS:  Outpatient Encounter Medications as of 08/06/2022  Medication Sig   acetaminophen (TYLENOL) 325 MG tablet Take 650 mg by mouth every 6 (six) hours as needed.   albuterol (VENTOLIN HFA) 108 (90 Base) MCG/ACT inhaler Inhale 2 puffs into the lungs every 6 (six) hours as needed for wheezing or shortness of breath.   ALPRAZolam (XANAX) 1 MG tablet TAKE ONE TABLET AT BEDTIME AS NEEDED FOR SLEEP -DO NOT DRIVE FOR 8 HOURS AFTER TAKING   amLODipine (NORVASC) 10 MG tablet Take 1 tablet (10 mg total)  by mouth daily.   anastrozole (ARIMIDEX) 1 MG tablet Take 1 tablet (1 mg total) by mouth daily.   aspirin EC 81 MG tablet Take 1 tablet (81 mg total) by mouth daily. Swallow whole.   Bempedoic Acid-Ezetimibe (NEXLIZET) 180-10 MG TABS Take 180 mg by mouth daily.   budesonide-formoterol (SYMBICORT) 160-4.5 MCG/ACT inhaler Inhale 2 puffs into the lungs 2 (two) times daily.   carvedilol (COREG) 12.5 MG tablet Take 1 tablet (12.5 mg total) by mouth 2 (two) times daily.   famotidine (PEPCID) 20 MG tablet TAKE ONE TABLET TWICE DAILY   irbesartan (AVAPRO) 300 MG tablet Take 1 tablet (300 mg total) by mouth daily.   levocetirizine (XYZAL) 5 MG tablet Take 1 tablet (5 mg total) by mouth every morning.   omega-3 acid ethyl esters (LOVAZA) 1 g capsule TAKE TWO CAPSULES EVERY DAY   spironolactone (ALDACTONE) 25 MG tablet Take 0.5 tablets (12.5 mg total) by mouth  daily.   venlafaxine XR (EFFEXOR-XR) 75 MG 24 hr capsule TAKE ONE CAPSULE EACH DAY WITH BREAKFAST   No facility-administered encounter medications on file as of 08/06/2022.     ONCOLOGIC FAMILY HISTORY:  Family History  Problem Relation Age of Onset   Heart disease Mother    Hypertension Mother    Diabetes Mother    Dementia Mother    COPD Father    Dementia Maternal Grandmother    Colon cancer Neg Hx      SOCIAL HISTORY:  Social History   Socioeconomic History   Marital status: Divorced    Spouse name: Not on file   Number of children: Not on file   Years of education: Not on file   Highest education level: Not on file  Occupational History   Occupation: Cleaning  Tobacco Use   Smoking status: Former    Packs/day: 1.00    Years: 26.00    Total pack years: 26.00    Types: Cigarettes    Quit date: 1989    Years since quitting: 34.8   Smokeless tobacco: Never  Vaping Use   Vaping Use: Never used  Substance and Sexual Activity   Alcohol use: No   Drug use: No   Sexual activity: Not Currently  Other Topics Concern   Not on file  Social History Narrative   Divorced for 61 years in 2016. 2 sons- 1 son had been in prison now living with her in 2019.  2 granddaughters with 1 living close.        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 Best friend [name] patient of Dr. Yong Channel. Enjoys work, time on Teaching laboratory technician.    Social Determinants of Health   Financial Resource Strain: Low Risk  (01/07/2022)   Overall Financial Resource Strain (CARDIA)    Difficulty of Paying Living Expenses: Not very hard  Food Insecurity: No Food Insecurity (01/07/2022)   Hunger Vital Sign    Worried About Running Out of Food in the Last Year: Never true    Ran Out of Food in the Last Year: Never true  Transportation Needs: No Transportation Needs (01/07/2022)    PRAPARE - Hydrologist (Medical): No    Lack of Transportation (Non-Medical): No  Physical Activity: Sufficiently Active (09/29/2021)   Exercise Vital Sign    Days of Exercise per Week: 2  days    Minutes of Exercise per Session: 120 min  Stress: Stress Concern Present (09/29/2021)   [place]    Feeling of Stress : To some extent  Social Connections: Moderately Isolated (09/29/2021)   Social Connection and Isolation Panel [NHANES]    Frequency of Communication with Friends and Family: More than three times a week    Frequency of Social Gatherings with Friends and Family: More than three times a week    Attends Religious Services: More than 4 times per year    Active Member of [employer] or Organizations: No    Attends Archivist Meetings: Never    Marital Status: Divorced  Human resources officer Violence: Not At Risk (09/29/2021)   Humiliation, Afraid, Rape, and Kick questionnaire    Fear of Current or Ex-Partner: No    Emotionally Abused: No    Physically Abused: No    Sexually Abused: No     OBSERVATIONS/OBJECTIVE:  BP (!) 138/53 (BP Location: Right Arm, Patient Position: Sitting)   Pulse 79   Temp 98.8 F (37.1 C) (Tympanic)   Resp 18   Ht 5' (1.524 m)   Wt 167 lb 12.8 oz (76.1 kg)   SpO2 98%   BMI 32.77 kg/m  GENERAL: Patient is a well appearing female in no acute distress HEENT:  Sclerae anicteric.  Oropharynx clear and moist. No ulcerations or evidence of oropharyngeal candidiasis. Neck is supple.  NODES:  No cervical, supraclavicular, or axillary lymphadenopathy palpated.  BREAST EXAM: Right breast status postlumpectomy and radiation no sign of local recurrence, erythema and warmth present.  Left breast is benign. LUNGS:  Clear to auscultation bilaterally.  No wheezes or rhonchi. HEART:  Regular rate and rhythm. No murmur appreciated. ABDOMEN:  Soft, nontender.  Positive,  normoactive bowel sounds. No organomegaly palpated. MSK:  No focal spinal tenderness to palpation. Full range of motion bilaterally in the upper extremities. EXTREMITIES:  No peripheral edema.   SKIN:  Clear with no obvious rashes or skin changes. No nail dyscrasia. NEURO:  Nonfocal. Well oriented.  Appropriate affect.   LABORATORY DATA:  None for this visit.  DIAGNOSTIC IMAGING:  None for this visit.      ASSESSMENT AND PLAN:  Ms.. Perry is a pleasant 76 y.o. female with Stage 0 right breast DCIS, ER+/PR+, diagnosed in February 2023, treated with lumpectomy, adjuvant radiation therapy, and anti-estrogen therapy with anastrozole beginning in October 2023.  She presents to the Survivorship Clinic for our initial meeting and routine follow-up post-completion of treatment for breast cancer.    1. Stage 0 right breast cancer:  Felicia Perry is continuing to recover from definitive treatment for breast cancer. She will follow-up with her medical oncologist, Dr. Chryl Heck in 3 months with history and physical exam per surveillance protocol.  She will continue her anti-estrogen therapy with anastrozole. Thus far, she is tolerating the anastrozole well, with minimal side effects.  Her mammogram is due February 2024; orders placed today.   She appears to have early cellulitis and I sent in doxycycline for her to take for this.  Today, a comprehensive survivorship care plan and treatment summary was reviewed with the patient today detailing her breast cancer diagnosis, treatment course, potential late/long-term effects of treatment, appropriate follow-up care with recommendations for the future, and patient education resources.  A copy of this summary, along with a letter will be sent to the patient's primary care provider via mail/fax/In Basket message after today's  visit.    2. Bone health:  Given Felicia Perry's age/history of breast cancer and her current treatment regimen including anti-estrogen therapy with  anastrozole, she is at risk for bone demineralization.  Perry orders for bone density testing to be completed in the next few weeks she was given education on specific activities to promote bone health.  3. Cancer screening:  Due to Felicia Perry's history and her age, she should receive screening for skin cancers, colon cancer, and gynecologic cancers.  The information and recommendations are listed on the patient's comprehensive care plan/treatment summary and were reviewed in detail with the patient.    5. Health maintenance and wellness promotion: Felicia Perry was encouraged to consume 5-7 servings of fruits and vegetables per day. We reviewed the "Nutrition Rainbow" handout.  She was also encouraged to engage in moderate to vigorous exercise for 30 minutes per day most days of the week. We discussed the [employer] fitness program, which is designed for cancer survivors to help them become more physically fit after cancer treatments.  She was instructed to limit her alcohol consumption and continue to abstain from tobacco use.     6. Support services/counseling: It is not uncommon for this period of the patient's cancer care trajectory to be one of many emotions and stressors. She was given information regarding our available services and encouraged to contact me with any questions or for help enrolling in any of our support group/programs.    Follow up instructions:    -Return to cancer center as scheduled -Mammogram due in February 2024 -She is welcome to return back to the Survivorship Clinic at any time; no additional follow-up needed at this time.  -Consider referral back to survivorship as a long-term survivor for continued surveillance  The patient was provided an opportunity to ask questions and all were answered. The patient agreed with the plan and demonstrated an understanding of the instructions.   Total encounter time:45 minutes*in face-to-face visit time, chart review, lab review,  care coordination, order entry, and documentation of the encounter time.    Wilber Bihari, NP 08/12/22 1:41 PM Medical Oncology and Hematology [facility] [place], [place] [address] Tel. [phone]    Fax. [phone]  *Total Encounter Time as defined by the Centers for Medicare and Medicaid Services includes, in addition to the face-to-face time of a patient visit (documented in the note above) non-face-to-face time: obtaining and reviewing outside history, ordering and reviewing medications, tests or procedures, care coordination (communications with other health care professionals or caregivers) and documentation in the medical record.

## 2022-08-07 ENCOUNTER — Other Ambulatory Visit: Payer: Self-pay | Admitting: *Deleted

## 2022-08-07 MED ORDER — FLUCONAZOLE 100 MG PO TABS: 100.0000 mg | ORAL_TABLET | Freq: Every day | ORAL | 1 refills | Status: DC

## 2022-08-18 ENCOUNTER — Telehealth: Payer: Self-pay | Admitting: *Deleted

## 2022-08-18 ENCOUNTER — Ambulatory Visit (HOSPITAL_BASED_OUTPATIENT_CLINIC_OR_DEPARTMENT_OTHER)
Admission: RE | Admit: 2022-08-18 | Discharge: 2022-08-18 | Disposition: A | Discharge status: Home / Self Care | Payer: Medicare Other | Source: Ambulatory Visit | Attending: Adult Health | Admitting: Adult Health

## 2022-08-18 DIAGNOSIS — Z780 Asymptomatic menopausal state: Secondary | ICD-10-CM | POA: Diagnosis not present

## 2022-08-18 DIAGNOSIS — E2839 Other primary ovarian failure: Secondary | ICD-10-CM | POA: Diagnosis not present

## 2022-08-18 DIAGNOSIS — M8589 Other specified disorders of bone density and structure, multiple sites: Secondary | ICD-10-CM | POA: Diagnosis not present

## 2022-08-18 NOTE — Telephone Encounter (Signed)
Per NP request RN placed call to pt regarding recent bone density report.  RN educated pt on OTC Vitamin D 25 mcg daily, Calcium 1,200 mg daily as well as weight bearing exercises.  Pt educated and verbalized understanding.

## 2022-09-02 NOTE — Progress Notes (Deleted)
[place] [facility] Phone: [phone] Subjective:    I'm seeing this patient by the request  of:  Marin Olp, MD  CC:   [id number]  Felicia Perry is a 76 y.o. female coming in with complaint of back and neck pain. OMT 07/03/2022. Also f/u for B knee pain. Patient states   Medications patient has been prescribed:   Taking:         Reviewed prior external information including notes and imaging from previsou exam, outside providers and external EMR if available.   As well as notes that were available from care everywhere and other healthcare systems.  Past medical history, social, surgical and family history all reviewed in electronic medical record.  No pertanent information unless stated regarding to the chief complaint.   Past Medical History:  Diagnosis Date   Adenomatous polyp 11/23/2005   Anxiety    01/09/22- patient denies anxiety   Asthma 2003   Basal cell carcinoma 2021   right cheek per patient at [facility]   Carotid stenosis    Chronic diastolic CHF (congestive heart failure) ([place]) 2017   Echo 2021 was normal   COPD (chronic obstructive pulmonary disease) ([place])    Coronary artery disease 2017   a. HC on 08/14/16 showed RCA CTO s/p PCI, 60% LCX disease managed medically with recs to consider staged PCI if continued symptoms.    Depression 2008   pt reports MD diagnosed but she does not feel depressed   Diabetes mellitus without complication ([place])    DIVERTICULOSIS, COLON 04/22/2007        GERD (gastroesophageal reflux disease) 2012   History of radiation therapy    Right breast- 03/04/22-04/02/22- Dr. Gery Pray   History of shingles    Hypertension 2008   Hypertensive heart disease    Insomnia 2003   Internal hemorrhoids    Osteoarthritis 2008   "knees, hands, lower back" (08/14/2016)   Pap smear for cervical cancer screening 2002   results normal   PUD (peptic  ulcer disease) 1989   Seasonal allergies 2013   Sleep apnea    a. resolved with weight loss    Allergies  Allergen Reactions   Celexa [Citalopram Hydrobromide]     psychosis   Cephalosporins     REACTION: tongue swelling   Clarithromycin     REACTION: blisters in mouth   Doxycycline Hyclate     REACTION: nausea, vomiting   Levofloxacin     REACTION: tongue swells   Penicillins     REACTION: per patient causes rash,hives   Rosuvastatin Other (See Comments)    Pt reports causes bilateral lower extremity muscle aches.    Zolpidem Tartrate     REACTION: difficulty with concentration     Review of Systems:  No headache, visual changes, nausea, vomiting, diarrhea, constipation, dizziness, abdominal pain, skin rash, fevers, chills, night sweats, weight loss, swollen lymph nodes, body aches, joint swelling, chest pain, shortness of breath, mood changes. POSITIVE muscle aches  Objective  There were no vitals taken for this visit.   General: No apparent distress alert and oriented x3 mood and affect normal, dressed appropriately.  HEENT: Pupils equal, extraocular movements intact  Respiratory: Patient's speak in full sentences and does not appear short of breath  Cardiovascular: No lower extremity edema, non tender, no erythema  Gait MSK:  Back   Osteopathic findings  C2 flexed rotated and side bent right C6 flexed  rotated and side bent left T3 extended rotated and side bent right inhaled rib T9 extended rotated and side bent left L2 flexed rotated and side bent right Sacrum right on right       Assessment and Plan:  No problem-specific Assessment & Plan notes found for this encounter.    Nonallopathic problems  Decision today to treat with OMT was based on Physical Exam  After verbal consent patient was treated with HVLA, ME, FPR techniques in cervical, rib, thoracic, lumbar, and sacral  areas  Patient tolerated the procedure well with improvement in  symptoms  Patient given exercises, stretches and lifestyle modifications  See medications in patient instructions if given  Patient will follow up in 4-8 weeks             Note: This dictation was prepared with Dragon dictation along with smaller phrase technology. Any transcriptional errors that result from this process are unintentional.

## 2022-09-04 ENCOUNTER — Encounter: Payer: Medicare Other | Admitting: Family Medicine

## 2022-09-07 ENCOUNTER — Telehealth (HOSPITAL_COMMUNITY): Payer: Self-pay | Admitting: Emergency Medicine

## 2022-09-07 NOTE — Telephone Encounter (Signed)
Reaching out to patient to offer assistance regarding upcoming cardiac imaging study; pt verbalizes understanding of appt date/time, parking situation and where to check in, pre-test NPO status and medications ordered, and verified current allergies; name and call back number provided for further questions should they arise Marchia Bond RN Navigator Cardiac Imaging [facility] [phone] office [phone] cell   Denies iv issues Holding caffeine 12 h Holding food 3h Holding carvedilol

## 2022-09-08 ENCOUNTER — Encounter (HOSPITAL_COMMUNITY): Payer: Self-pay

## 2022-09-08 ENCOUNTER — Other Ambulatory Visit (HOSPITAL_COMMUNITY): Payer: Self-pay | Admitting: Cardiology

## 2022-09-08 ENCOUNTER — Encounter (HOSPITAL_COMMUNITY)
Admission: RE | Admit: 2022-09-08 | Discharge: 2022-09-08 | Disposition: A | Discharge status: Home / Self Care | Payer: Medicare Other | Source: Ambulatory Visit | Attending: Cardiology | Admitting: Cardiology

## 2022-09-08 ENCOUNTER — Ambulatory Visit: Payer: Medicare Other | Admitting: Family Medicine

## 2022-09-08 DIAGNOSIS — I25119 Atherosclerotic heart disease of native coronary artery with unspecified angina pectoris: Secondary | ICD-10-CM | POA: Diagnosis not present

## 2022-09-08 DIAGNOSIS — R0609 Other forms of dyspnea: Secondary | ICD-10-CM | POA: Diagnosis not present

## 2022-09-08 DIAGNOSIS — R0602 Shortness of breath: Secondary | ICD-10-CM | POA: Diagnosis not present

## 2022-09-08 LAB — NM PET CT CARDIAC PERFUSION MULTI W/ABSOLUTE BLOODFLOW
MBFR: 3
Nuc Rest EF: 64 %
Nuc Stress EF: 64 %
Rest MBF: 0.82 ml/g/min
ST Depression (mm): 0 mm
Stress MBF: 2.46 ml/g/min
TID: 1.16

## 2022-09-08 MED ORDER — REGADENOSON 0.4 MG/5ML IV SOLN
INTRAVENOUS | Status: AC
  Administered 2022-09-08: 0.4 mg via INTRAVENOUS
  Filled 2022-09-08: qty 5

## 2022-09-08 MED ORDER — RUBIDIUM RB82 GENERATOR (RUBYFILL)
19.6000 | PACK | Freq: Once | INTRAVENOUS | Status: AC
  Administered 2022-09-08: 19.6 via INTRAVENOUS

## 2022-09-08 MED ORDER — RUBIDIUM RB82 GENERATOR (RUBYFILL)
19.7000 | PACK | Freq: Once | INTRAVENOUS | Status: AC
  Administered 2022-09-08: 19.7 via INTRAVENOUS

## 2022-09-08 MED ORDER — REGADENOSON 0.4 MG/5ML IV SOLN: 0.4000 mg | Freq: Once | INTRAVENOUS | Status: AC

## 2022-09-09 ENCOUNTER — Ambulatory Visit: Payer: Medicare Other | Attending: Cardiology | Admitting: Student

## 2022-09-09 DIAGNOSIS — I10 Essential (primary) hypertension: Secondary | ICD-10-CM

## 2022-09-09 MED ORDER — CARVEDILOL 25 MG PO TABS: 25.0000 mg | ORAL_TABLET | Freq: Two times a day (BID) | ORAL | 3 refills | Status: DC

## 2022-09-09 NOTE — Patient Instructions (Addendum)
Changes made by your pharmacist Cammy Copa, PharmD at today's visit:    Instructions/Changes  (what do you need to do) Your Notes  (what you did and when you did it)  Increase carvedilol from 12.5 mg twice daily to 25 mg twice daily (so 2 tablets twice daily instead of 1 tab twice daily)    2.continue taking irbesartan 300 mg daily, amlodipine 10 mg daily, spironolactone 25 mg daily    3. Increase exercise - walking from 2 days weeks to 3 days a week and add biking 15 min on the days when you are not walking     Bring all of your meds, your BP cuff and your record of home blood pressures to your next appointment.    HOW TO TAKE YOUR BLOOD PRESSURE AT HOME  Rest 5 minutes before taking your blood pressure.  Don't smoke or drink caffeinated beverages for at least 30 minutes before. Take your blood pressure before (not after) you eat. Sit comfortably with your back supported and both feet on the floor (don't cross your legs). Elevate your arm to heart level on a table or a desk. Use the proper sized cuff. It should fit smoothly and snugly around your bare upper arm. There should be enough room to slip a fingertip under the cuff. The bottom edge of the cuff should be 1 inch above the crease of the elbow. Ideally, take 3 measurements at one sitting and record the average.  Important lifestyle changes to control high blood pressure  Intervention  Effect on the BP  Lose extra pounds and watch your waistline Weight loss is one of the most effective lifestyle changes for controlling blood pressure. If you're overweight or obese, losing even a small amount of weight can help reduce blood pressure. Blood pressure might go down by about 1 millimeter of mercury (mm Hg) with each kilogram (about 2.2 pounds) of weight lost.  Exercise regularly As a general goal, aim for at least 30 minutes of moderate physical activity every day. Regular physical activity can lower high blood pressure by about 5 to  8 mm Hg.  Eat a healthy diet Eating a diet rich in whole grains, fruits, vegetables, and low-fat dairy products and low in saturated fat and cholesterol. A healthy diet can lower high blood pressure by up to 11 mm Hg.  Reduce salt (sodium) in your diet Even a small reduction of sodium in the diet can improve heart health and reduce high blood pressure by about 5 to 6 mm Hg.  Limit alcohol One drink equals 12 ounces of beer, 5 ounces of wine, or 1.5 ounces of 80-proof liquor.  Limiting alcohol to less than one drink a day for women or two drinks a day for men can help lower blood pressure by about 4 mm Hg.   If you have any questions or concerns please use My Chart to send questions or call the office at [phone]

## 2022-09-09 NOTE — Assessment & Plan Note (Addendum)
Assessment:  BP : unconrolled gaol <130/80 inoffice 1st reading 140/90, 2nd reading 142/88 Patient reports taking medication regularly and tolerating them well without side effects  Denies dizziness, palpitation or chest pain. She is getting out of breath little too much today; more than her baseline. Weight is stable and does not have any water retention  On max dose of ARB, DHP-CCB, and MRA. It is reasonable to optimize betablocker dose   Plan: Continue taking irbesartan 300 mg daily, amlodipine 10 mg daily and spironolactone 25 mg daily  Increase carvedilol 12.5 mg twice daily to 25 mg twice daily  Increase exercise - walking from 2 days weeks to 3 days a week and add biking 15 min on the days when you are not walking  Use pill box to track medications adherence Follow up scheduled with PharmD in 5-6 weeks

## 2022-09-09 NOTE — Progress Notes (Signed)
Patient ID: Felicia Perry                 DOB: Mar 19, 1946                      MRN: [id number]      HPI: Felicia Perry is a 76 y.o. female referred by Dr. Johney Frame to HTN clinic. PMH is significant for CAD, chronic diastolic CHF, carotid artery disease, HLD with statin intolerance, HTN, PUD, asthma, anxiety. Patient saw Dr. Johney Frame on 07/29/2022. In office BP was 168/82 (142/80 on recheck) and her home readings was also staying in 155-160/79-85 range. So Coreg 12.5 twice daily and spironolactone 25 mg daily was added to her other BP medications (irbesartan 300 mg daily, amlodipine 10 mg daily).BMP post MRA initiation was WNL.    Patient has no acute concern today, she for got to bring her BP cuff for validation and readings too. Reports her refrigerator broke so have been eating every meal out last week but now she will be going back eating healthy home cooked meals. Reports taking all the medications regularly and tolerate them well. Denies dizziness, palpitation or chest pain. She is getting out of breath little too much today; more than her baseline. Denies swelling or water retention. Does not use pill box to track adherence. She loves playing tennis and riding bike but unable to do it any of these activities because of bad knees. Still works - Development worker, community job.   Current HTN meds: irbesartan 300 mg daily, amlodipine 10 mg daily, Coreg 12.5 mg twice daily, spironolactone 25 mg daily  Previously tried:  BP goal: 130/80  Family History: COPD in her father; Dementia in her maternal grandmother and mother; Diabetes in her mother; Heart disease in her mother; Hypertension in her mothe   Social History:  Alcohol: occasional  Smoking: never   Diet: breakfast- boil eggs with salt, oat meals - encourage to replace salt with herbs  Lunch- sandwiches or salads- pay attention to the amount of dressing have delimeat for sandwich twice week - encourage to cut down on deli meat  Supper- chicken,  fish mainly backed or grilled with greens (cooked without fat)  Exercise: work  0.5 to 1 mile walk twice week  Encouraged to start doing 3 times per weeks and then work your way up.Bike riding start with only 15 min see it that's affect knees   The patient does not participate in regular exercise at present.  Home BP readings: forgot bringing log; recalls highest ~ 155/90 and lowest ~130/90 wit heart rate ~72-76   Wt Readings from Last 3 Encounters:  09/09/22 167 lb (75.8 kg)  08/06/22 167 lb 12.8 oz (76.1 kg)  07/29/22 166 lb (75.3 kg)   BP Readings from Last 3 Encounters:  09/09/22 (!) 142/88  09/08/22 (!) 171/75  08/06/22 (!) 138/53   Pulse Readings from Last 3 Encounters:  09/09/22 62  09/08/22 80  08/06/22 79    Renal function: CrCl cannot be calculated (Patient's most recent lab result is older than the maximum 21 days allowed.).  Past Medical History:  Diagnosis Date   Adenomatous polyp 11/23/2005   Anxiety    01/09/22- patient denies anxiety   Asthma 2003   Basal cell carcinoma 2021   right cheek per patient at [facility]   Carotid stenosis    Chronic diastolic CHF (congestive heart failure) ([place]) 2017   Echo 2021 was normal   COPD (chronic obstructive  pulmonary disease) ([place])    Coronary artery disease 2017   a. HC on 08/14/16 showed RCA CTO s/p PCI, 60% LCX disease managed medically with recs to consider staged PCI if continued symptoms.    Depression 2008   pt reports MD diagnosed but she does not feel depressed   Diabetes mellitus without complication ([place])    DIVERTICULOSIS, COLON 04/22/2007        GERD (gastroesophageal reflux disease) 2012   History of radiation therapy    Right breast- 03/04/22-04/02/22- Dr. Gery Pray   History of shingles    Hypertension 2008   Hypertensive heart disease    Insomnia 2003   Internal hemorrhoids    Osteoarthritis 2008   "knees, hands, lower back" (08/14/2016)   Pap smear for cervical cancer screening  2002   results normal   PUD (peptic ulcer disease) 1989   Seasonal allergies 2013   Sleep apnea    a. resolved with weight loss    Current Outpatient Medications on File Prior to Visit  Medication Sig Dispense Refill   acetaminophen (TYLENOL) 325 MG tablet Take 650 mg by mouth every 6 (six) hours as needed.     albuterol (VENTOLIN HFA) 108 (90 Base) MCG/ACT inhaler Inhale 2 puffs into the lungs every 6 (six) hours as needed for wheezing or shortness of breath. 18 g 5   ALPRAZolam (XANAX) 1 MG tablet TAKE ONE TABLET AT BEDTIME AS NEEDED FOR SLEEP -DO NOT DRIVE FOR 8 HOURS AFTER TAKING 90 tablet 1   amLODipine (NORVASC) 10 MG tablet Take 1 tablet (10 mg total) by mouth daily. 90 tablet 2   anastrozole (ARIMIDEX) 1 MG tablet Take 1 tablet (1 mg total) by mouth daily. 30 tablet 3   aspirin EC 81 MG tablet Take 1 tablet (81 mg total) by mouth daily. Swallow whole. 90 tablet 3   Bempedoic Acid-Ezetimibe (NEXLIZET) 180-10 MG TABS Take 180 mg by mouth daily. 90 tablet 3   budesonide-formoterol (SYMBICORT) 160-4.5 MCG/ACT inhaler Inhale 2 puffs into the lungs 2 (two) times daily. 1 each 11   doxycycline (VIBRA-TABS) 100 MG tablet Take 1 tablet (100 mg total) by mouth 2 (two) times daily. 14 tablet 0   famotidine (PEPCID) 20 MG tablet TAKE ONE TABLET TWICE DAILY 60 tablet 5   fluconazole (DIFLUCAN) 100 MG tablet Take 1 tablet (100 mg total) by mouth daily. 1 tablet 1   irbesartan (AVAPRO) 300 MG tablet Take 1 tablet (300 mg total) by mouth daily. 90 tablet 2   levocetirizine (XYZAL) 5 MG tablet Take 1 tablet (5 mg total) by mouth every morning. 90 tablet 3   omega-3 acid ethyl esters (LOVAZA) 1 g capsule TAKE TWO CAPSULES EVERY DAY 180 capsule 3   ondansetron (ZOFRAN-ODT) 8 MG disintegrating tablet Take 1 tablet (8 mg total) by mouth every 8 (eight) hours as needed for nausea or vomiting. 20 tablet 0   spironolactone (ALDACTONE) 25 MG tablet Take 0.5 tablets (12.5 mg total) by mouth daily. 45 tablet 3    venlafaxine XR (EFFEXOR-XR) 75 MG 24 hr capsule TAKE ONE CAPSULE EACH DAY WITH BREAKFAST 90 capsule 2   No current facility-administered medications on file prior to visit.    Allergies  Allergen Reactions   Celexa [Citalopram Hydrobromide]     psychosis   Cephalosporins     REACTION: tongue swelling   Clarithromycin     REACTION: blisters in mouth   Doxycycline Hyclate     REACTION: nausea, vomiting  Levofloxacin     REACTION: tongue swells   Penicillins     REACTION: per patient causes rash,hives   Rosuvastatin Other (See Comments)    Pt reports causes bilateral lower extremity muscle aches.    Zolpidem Tartrate     REACTION: difficulty with concentration    Blood pressure (!) 142/88, pulse 62, weight 167 lb (75.8 kg), SpO2 97 %.    Essential hypertension Assessment:  BP : unconrolled gaol <130/80 inoffice 1st reading 140/90, 2nd reading 142/88 Patient reports taking medication regularly and tolerating them well without side effects  Denies dizziness, palpitation or chest pain. She is getting out of breath little too much today; more than her baseline. Weight is stable and does not have any water retention  On max dose of ARB, DHP-CCB, and MRA. It is reasonable to optimize betablocker dose   Plan: Continue taking irbesartan 300 mg daily, amlodipine 10 mg daily and spironolactone 25 mg daily  Increase carvedilol 12.5 mg twice daily to 25 mg twice daily  Increase exercise - walking from 2 days weeks to 3 days a week and add biking 15 min on the days when you are not walking  Use pill box to track medications adherence Follow up scheduled with PharmD in 5-6 weeks     Thank you  Cammy Copa, Pharm.D [facility] of [facility] [address], [place], [place] [address]  Phone: [phone]; Fax: [phone]

## 2022-09-13 NOTE — Progress Notes (Unsigned)
Cardiology Office Note:    Date:  09/13/2022   ID:  Felicia Perry, Felicia Perry January 21, 1946, MRN [id number]  PCP:  Felicia Olp, MD   [facility] Providers Cardiologist:  Felicia Bergeron, MD {   Referring MD: Felicia Olp, MD    History of Present Illness:    Felicia Perry is a 76 y.o. female with a hx of CAD (dx 2017 - RCA CTO s/p complex PCI, 60% LCx disease, normal LVEF 08/14/16),  chronic diastolic CHF, carotid artery disease, HLD with statin intolerance, HTN, PUD, asthma, anxiety who was previously followed by Dr. Meda Perry who now presents to clinic for follow-up of CAD and CHF.  Per review of the record, the patient was seen by Dr. Meda Perry on 08/12/16 for evaluation of chest pain and a positive stress test which was ordered by PCP. This demonstrated a hypotensive response to exercise and a medium defect of moderate severity present in the basal inferoseptal, mid inferoseptal and apical septal and mid and apical inferior location that was reversible and consistent with ischemia.  LHC on 08/14/16 showed RCA CTO s/p complex PCI, 60% LCX disease managed medically with recs to consider staged PCI if continued symptoms. Myoview  01/2017 was low risk study. TTE 01/2017 showed LVEF of 55-60%, elevated filling pressure, mild to moderate AI.  Last myoview 02/15/20 with EF 71%, mild defect in apex but no ischemia. TTE 02/15/20 with LVEF 60-65%, severe LVH, mild MR, aortic sclerosis. Carotid 06/27/20 with RICA 6-59% and LICA 93-57%.  Was last seen in clinic on 12/24/21 where she was doing well from a CV standpoint. Carotid ultrasound 06/2022 with 01-77% in the LICA and 9-39% RICA.   Was last seen in clinic on 07/2022 where she was having episodes of SOB with exertion like walking to the mailbox. We obtained a cardiac PET which showed mid inferolateral ischemia but with TID and lack of augmentation of systolic function with stress. MBFR was normal. Discussion held with the patient and given  known Lcx disease and symptoms, decision was made to proceed with cath.  Today, ***    Past Medical History:  Diagnosis Date   Adenomatous polyp 11/23/2005   Anxiety    01/09/22- patient denies anxiety   Asthma 2003   Basal cell carcinoma 2021   right cheek per patient at [place] [facility]   Carotid stenosis    Chronic diastolic CHF (congestive heart failure) ([place]) 2017   Echo 2021 was normal   COPD (chronic obstructive pulmonary disease) ([place])    Coronary artery disease 2017   a. HC on 08/14/16 showed RCA CTO s/p PCI, 60% LCX disease managed medically with recs to consider staged PCI if continued symptoms.    Depression 2008   pt reports MD diagnosed but she does not feel depressed   Diabetes mellitus without complication ([place])    DIVERTICULOSIS, COLON 04/22/2007        GERD (gastroesophageal reflux disease) 2012   History of radiation therapy    Right breast- 03/04/22-04/02/22- Dr. Gery Pray   History of shingles    Hypertension 2008   Hypertensive heart disease    Insomnia 2003   Internal hemorrhoids    Osteoarthritis 2008   "knees, hands, lower back" (08/14/2016)   Pap smear for cervical cancer screening 2002   results normal   PUD (peptic ulcer disease) 1989   Seasonal allergies 2013   Sleep apnea    a. resolved with weight loss    Past Surgical History:  Procedure Laterality Date   BREAST BIOPSY Right 12/30/2021   BREAST EXCISIONAL BIOPSY Left    1980's x 3 - all benign   BREAST EXCISIONAL BIOPSY Right    1980's x 1 or 2 - all benign   BREAST LUMPECTOMY WITH RADIOACTIVE SEED LOCALIZATION Right 01/22/2022   Procedure: RIGHT BREAST LUMPECTOMY WITH RADIOACTIVE SEED LOCALIZATION;  Surgeon: Erroll Luna, MD;  Location: [facility];  Service: General;  Laterality: Right;   BREAST SURGERY Left 1980s   Fibrous Tumors removed    CARDIAC CATHETERIZATION N/A 08/14/2016   Procedure: Left Heart Cath and Coronary Angiography;  Surgeon: Sherren Mocha,  MD;  Location: [facility];  Service: Cardiovascular;  Laterality: N/A;   CARDIAC CATHETERIZATION N/A 08/14/2016   Procedure: Coronary Stent Intervention;  Surgeon: Sherren Mocha, MD;  Location: [facility];  Service: Cardiovascular;  Laterality: N/A;   CATARACT EXTRACTION W/ INTRAOCULAR LENS  IMPLANT, BILATERAL Bilateral 05/2009   COLONOSCOPY N/A 2016   Cologuard 2019   CORONARY ANGIOPLASTY     detached retina left eye     may 2019   TUBAL LIGATION  1970s    Current Medications: No outpatient medications have been marked as taking for the 09/15/22 encounter (Appointment) with Felicia Bergeron, MD.     Allergies:   Celexa [citalopram hydrobromide], Cephalosporins, Clarithromycin, Doxycycline hyclate, Levofloxacin, Penicillins, Rosuvastatin, and Zolpidem tartrate   Social History   Socioeconomic History   Marital status: Divorced    Spouse name: Not on file   Number of children: Not on file   Years of education: Not on file   Highest education level: Not on file  Occupational History   Occupation: Cleaning  Tobacco Use   Smoking status: Former    Packs/day: 1.00    Years: 26.00    Total pack years: 26.00    Types: Cigarettes    Quit date: 1989    Years since quitting: 34.9   Smokeless tobacco: Never  Vaping Use   Vaping Use: Never used  Substance and Sexual Activity   Alcohol use: No   Drug use: No   Sexual activity: Not Currently  Other Topics Concern   Not on file  Social History Narrative   Divorced for 55 years in 2016. 2 sons- 1 son had been in prison now living with her in 2019.  2 granddaughters with 1 living close.        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 Best friend [name] patient of Dr. Yong Channel. Enjoys work, time on Teaching laboratory technician.    Social Determinants of Health   Financial Resource Strain: Low Risk   (01/07/2022)   Overall Financial Resource Strain (CARDIA)    Difficulty of Paying Living Expenses: Not very hard  Food Insecurity: No Food Insecurity (01/07/2022)   Hunger Vital Sign    Worried About Running Out of Food in the Last Year: Never true    Ran Out of Food in the Last Year: Never true  Transportation Needs: No Transportation Needs (01/07/2022)   PRAPARE - Hydrologist (Medical): No    Lack of Transportation (Non-Medical): No  Physical Activity: Sufficiently Active (09/29/2021)   Exercise Vital Sign    Days of Exercise per Week: 2 days    Minutes of Exercise per Session: 120 min  Stress: Stress Concern Present (09/29/2021)   [place]    Feeling of Stress : To some extent  Social Connections: Moderately Isolated (09/29/2021)   Social Connection and Isolation Panel [NHANES]    Frequency of Communication with Friends and Family: More than three times a week    Frequency of Social Gatherings with Friends and Family: More than three times a week    Attends Religious Services: More than 4 times per year    Active Member of [employer] or Organizations: No    Attends Music therapist: Never    Marital Status: Divorced     Family History: The patient's family history includes COPD in her father; Dementia in her maternal grandmother and mother; Diabetes in her mother; Heart disease in her mother; Hypertension in her mother. There is no history of Colon cancer.  ROS:   Please see the history of present illness.    Review of Systems  Constitutional:  Negative for chills, fever and weight loss.  HENT:  Negative for hearing loss.   Eyes:  Negative for blurred vision.  Respiratory:  Positive for shortness of breath.   Cardiovascular:  Positive for chest pain. Negative for palpitations, orthopnea, claudication, leg swelling and PND.  Gastrointestinal:  Negative for nausea and vomiting.   Genitourinary:  Negative for hematuria.  Musculoskeletal:  Positive for joint pain (Bilateral knees). Negative for falls.  Neurological:  Positive for headaches. Negative for dizziness and loss of consciousness.  Endo/Heme/Allergies:  Negative for environmental allergies.  Psychiatric/Behavioral:  The patient is not nervous/anxious and does not have insomnia.    All other systems reviewed and negative  EKGs/Labs/Other Studies Reviewed:    The following studies were reviewed today: NM PET 08/2022:  Findings are consistent with a small region of ischemia. The study is intermediate-to-high risk due to the presence of TID and lack of augmentation of systolic function with stress. There is very mild ischemia noted and normal myocardial blood flow reserve.   LV perfusion is abnormal. Defect 1: There is a small defect with mild reduction in uptake present in the mid inferolateral location(s) that is reversible. There is abnormal wall motion in the defect area. Consistent with ischemia.   Rest left ventricular function is normal. Rest EF: 64 %. Stress left ventricular function is normal, but there was no augmentation in systolic function when compared to rest. Stress EF: 64 %. End diastolic cavity size is normal. End systolic cavity size is normal.   Myocardial blood flow was computed to be 0.54m/g/min at rest and 2.4108mg/min at stress. Global myocardial blood flow reserve was 3.00 and was normal.   Coronary calcium was present on the attenuation correction CT images. Severe coronary calcifications were present. Coronary calcifications were present in the left anterior descending artery, left circumflex artery and right coronary artery distribution(s).  Carotids 06/26/22: Summary:  Right Carotid: Velocities in the right ICA are consistent with a 1-39%  stenosis.   Left Carotid: Velocities in the left ICA are consistent with a 60-79%  stenosis.                Non-hemodynamically significant  plaque <50% noted in the  CCA. The                ECA appears >50% stenosed.   Vertebrals:  Bilateral vertebral arteries demonstrate antegrade flow.  Subclavians: Normal flow hemodynamics were seen in bilateral subclavian  arteries.   Carotids 06/27/21 Right Carotid: Velocities in the right ICA are consistent with a 1-39%  stenosis.   Left Carotid: Velocities in the left ICA are consistent with a 40-59%  stenosis. Non-hemodynamically significant plaque <50% noted in the  CCA.   Vertebrals:  Bilateral vertebral arteries demonstrate antegrade flow.  Subclavians: Normal flow hemodynamics were seen in bilateral subclavian arteries.  CTA Head/Neck 01/02/21 IMPRESSION: 1. No intracranial arterial occlusion or high-grade stenosis. 2. 60 % stenosis of the proximal left internal carotid artery secondary to predominantly calcified atherosclerosis.   Aortic Atherosclerosis (ICD10-I70.0).  Myoview 02/15/20: 08/12/16 for evaluation of chest pain and a positive stress test which was ordered by PCP. This demonstrated a hypotensive response to exercise and a medium defect of moderate severity present in the basal inferoseptal, mid inferoseptal and apical septal and mid and apical inferior location that was reversible and consistent with ischemia.  LHC on 08/14/16 showed RCA CTO s/p complex PCI, 60% LCX disease managed medically with recs to consider staged PCI if continued symptoms. Last stress test 01/2017 was low risk study. Echo 01/2017 showed LVEF of 55-60%, elevated filling pressure, mild to moderate AI.  TTE 02/15/20: IMPRESSIONS   1. Left ventricular ejection fraction, by estimation, is 60 to 65%. The  left ventricle has normal function. The left ventricle has no regional  wall motion abnormalities. The left ventricular internal cavity size was  mildly dilated. There is severe left ventricular hypertrophy. Left ventricular diastolic parameters were normal.   2. Right ventricular  systolic function is normal. The right ventricular  size is normal. There is mildly elevated pulmonary artery systolic  pressure.   3. The mitral valve is normal in structure. Mild mitral valve  regurgitation. No evidence of mitral stenosis.   4. The aortic valve is tricuspid. Aortic valve regurgitation is mild.  Sclerosis with small gradient but no stenosis.   5. The inferior vena cava is normal in size with greater than 50%  respiratory variability, suggesting right atrial pressure of 3 mmHg.   Carotid 06/27/20: Summary:  Right Carotid: Velocities in the right ICA are consistent with a 1-39%  stenosis.   Left Carotid: Velocities in the left ICA are consistent with a 40-59%  stenosis. Non-hemodynamically significant plaque <50% noted in the  CCA.   Vertebrals:  Bilateral vertebral arteries demonstrate antegrade flow.   Subclavians: Normal flow hemodynamics were seen in bilateral subclavian arteries.   EKG:  EKG is personally reviewed. 07/29/2022:  EKG was not ordered. 01/15/2022:  NSR at 66 bpm.  Recent Labs: 04/08/2022: ALT 33; Hemoglobin 14.1; Platelets 313.0 08/05/2022: BUN 30; Creatinine, Ser 0.88; Potassium 4.4; Sodium 142   Recent Lipid Panel    Component Value Date/Time   CHOL 116 12/09/2021 0954   CHOL 100 06/22/2019 0907   TRIG 217.0 (H) 12/09/2021 0954   HDL 32.90 (L) 12/09/2021 0954   HDL 32 (L) 06/22/2019 0907   CHOLHDL 4 12/09/2021 0954   VLDL 43.4 (H) 12/09/2021 0954   LDLCALC 48 08/07/2020 0845   LDLDIRECT 53.0 12/09/2021 0954     Risk Assessment/Calculations:       Physical Exam:    VS:  There were no vitals taken for this visit.    Wt Readings from Last 3 Encounters:  09/09/22 167 lb (75.8 kg)  08/06/22 167 lb 12.8 oz (76.1 kg)  07/29/22 166 lb (75.3 kg)     GEN:  Well nourished, well developed in no acute distress HEENT: Normal NECK: No JVD; No  carotid bruits CARDIAC: RRR, no murmurs, rubs, gallops RESPIRATORY:  Clear to auscultation  without rales, wheezing or rhonchi  ABDOMEN: Soft, non-tender, non-distended MUSCULOSKELETAL:  No edema; No deformity  SKIN: Warm and dry NEUROLOGIC:  Alert and oriented x 3 PSYCHIATRIC:  Normal affect   ASSESSMENT:    No diagnosis found.    PLAN:    In order of problems listed above:  #Coronary Artery Disease: #DOE:  LHC in 2017 with RCA CTO s/p complex PCI, 60% LCx disease. TTE 01/2020 with LVEF 60-65%.Now with worsening dyspnea on exertion. NM PET with mild ischemia in the mid inferolateral wall as well as TID and lack of systolic augmentation with stress. MBFR normal. After discussion with the patient, plan to proceed with cath given degree of symptoms and known Lcx disease.  -Plan for LHC  -Continue ASA '81mg'$  daily  -Continue irbesartan '300mg'$  daily -Continue coreg 12.'5mg'$  BID -Intolerant to statins; current on nexlizet 180-'10mg'$  daily; LDL well controlled in 50s -Continue lifestyle modifications as detailed below  #HTN: *** -Continue irbesartan '300mg'$  daily -Continue coreg to 12.'5mg'$  BID -Continue amlodipine '10mg'$  daily -Continue spironolactone '25mg'$  daily -BMET next week -Follow-up with Pharm D   #Chronic Diastolic Heart Failure: Euvolemic with NYHA class II symptoms. TTE with LVEF 60-65%, severe LVH. -Not on diuretics -Continue irbesartan '300mg'$  daily -Continue amlodipine '10mg'$  daily -Continue spironolactone '25mg'$  daily -BMET next week -Low Na diet   #HLD: #Statin Intolerance: LDL at goal in 50s.  -Continue nexlizet 180-'10mg'$  daily   #Carotid Artery Disease: LICA with 36-64%, RICA with 1-39% 06/2022. Will continue serial monitoring.  -Continue nexlizet 180-'10mg'$  daily -On ASA '81mg'$  daily  -Repeat carotid ultrasound 06/2023  #Obesity with BMI 32: -Continue lifestyle modifications as detailed below   Exercise recommendations: Goal of exercising for at least 30 minutes a day, at least 5 times per week.  Please exercise to a moderate exertion.  This means that while  exercising it is difficult to speak in full sentences, however you are not so short of breath that you feel you must stop, and not so comfortable that you can carry on a full conversation.  Exertion level should be approximately a 5/10, if 10 is the most exertion you can perform.  Diet recommendations: Recommend a heart healthy diet such as the Mediterranean diet.  This diet consists of plant based foods, healthy fats, lean meats, olive oil.  It suggests limiting the intake of simple carbohydrates such as white breads, pastries, and pastas.  It also limits the amount of red meat, wine, and dairy products such as cheese that one should consume on a daily basis.  Follow-up:  3 months.    Medication Adjustments/Labs and Tests Ordered: Current medicines are reviewed at length with the patient today.  Concerns regarding medicines are outlined above.   No orders of the defined types were placed in this encounter.  No orders of the defined types were placed in this encounter.  There are no Patient Instructions on file for this visit.    Signed, Felicia Bergeron, MD  09/13/2022 2:16 PM    [place]

## 2022-09-15 ENCOUNTER — Encounter: Payer: Self-pay | Admitting: *Deleted

## 2022-09-15 ENCOUNTER — Ambulatory Visit: Payer: Medicare Other | Attending: Cardiology | Admitting: Cardiology

## 2022-09-15 ENCOUNTER — Telehealth: Payer: Self-pay | Admitting: *Deleted

## 2022-09-15 VITALS — BP 126/68 | HR 64 | Ht 60.0 in | Wt 168.8 lb

## 2022-09-15 DIAGNOSIS — R400 Somnolence: Secondary | ICD-10-CM

## 2022-09-15 DIAGNOSIS — I25119 Atherosclerotic heart disease of native coronary artery with unspecified angina pectoris: Secondary | ICD-10-CM

## 2022-09-15 DIAGNOSIS — R0609 Other forms of dyspnea: Secondary | ICD-10-CM | POA: Diagnosis not present

## 2022-09-15 DIAGNOSIS — I5032 Chronic diastolic (congestive) heart failure: Secondary | ICD-10-CM

## 2022-09-15 DIAGNOSIS — E782 Mixed hyperlipidemia: Secondary | ICD-10-CM

## 2022-09-15 DIAGNOSIS — Z01812 Encounter for preprocedural laboratory examination: Secondary | ICD-10-CM | POA: Diagnosis not present

## 2022-09-15 DIAGNOSIS — R0683 Snoring: Secondary | ICD-10-CM

## 2022-09-15 DIAGNOSIS — I10 Essential (primary) hypertension: Secondary | ICD-10-CM

## 2022-09-15 DIAGNOSIS — I6523 Occlusion and stenosis of bilateral carotid arteries: Secondary | ICD-10-CM

## 2022-09-15 DIAGNOSIS — R0602 Shortness of breath: Secondary | ICD-10-CM

## 2022-09-15 DIAGNOSIS — E669 Obesity, unspecified: Secondary | ICD-10-CM

## 2022-09-15 DIAGNOSIS — Z789 Other specified health status: Secondary | ICD-10-CM

## 2022-09-15 NOTE — H&P (View-Only) (Signed)
Cardiology Office Note:    Date:  09/15/2022   ID:  Janica, Eldred [birth year], MRN [id number]  PCP:  Marin Olp, MD   [facility] Providers Cardiologist:  [name], MD {   Referring MD: Marin Olp, MD    History of Present Illness:    Felicia Perry is a 76 y.o. female with a hx of CAD (dx 2017 - RCA CTO s/p complex PCI, 60% LCx disease, normal LVEF 08/14/16),  chronic diastolic CHF, carotid artery disease, HLD with statin intolerance, HTN, PUD, asthma, anxiety who was previously followed by Dr. Meda Coffee who now presents to clinic for follow-up of CAD and CHF.  Per review of the record, the patient was seen by Dr. Meda Coffee on 08/12/16 for evaluation of chest pain and a positive stress test which was ordered by PCP. This demonstrated a hypotensive response to exercise and a medium defect of moderate severity present in the basal inferoseptal, mid inferoseptal and apical septal and mid and apical inferior location that was reversible and consistent with ischemia.  LHC on 08/14/16 showed RCA CTO s/p complex PCI, 60% LCX disease managed medically with recs to consider staged PCI if continued symptoms. Myoview  01/2017 was low risk study. TTE 01/2017 showed LVEF of 55-60%, elevated filling pressure, mild to moderate AI.  Last myoview 02/15/20 with EF 71%, mild defect in apex but no ischemia. TTE 02/15/20 with LVEF 60-65%, severe LVH, mild MR, aortic sclerosis. Carotid 06/27/20 with RICA 6-81% and LICA 27-51%.  Was last seen in clinic on 12/24/21 where she was doing well from a CV standpoint. Carotid ultrasound 06/2022 with 70-01% in the LICA and 7-49% RICA.   Was last seen in clinic on 07/2022 where she was having episodes of SOB with exertion like walking to the mailbox. We obtained a cardiac PET which showed mid inferolateral ischemia but with TID and lack of augmentation of systolic function with stress. MBFR was normal. Discussion held with the patient and given  known Lcx disease and symptoms, decision was made to proceed with cath.  Today, the patient states that she is feeling extremely tired. Walking up to the office just now was difficult and left her feeling weak and short of breath. She is also short winded with short walks such as from her bed to the bathroom. This is concerning as she is typically an active person. No chest pain but she notes that she "just feels off."  Sometimes she does lose consciousness if she develops a severe coughing spell. This usually lasts for a few seconds while she is sitting on the cough or the bed. She denies falling; she only blacks out for a few seconds before regaining consciousness.   Additionally she complains of leaning to the right all the time. She is not sure of the etiology, but notes that she still keeps her balance despite leaning over to the right while moving.  She has noticed that her blood pressure is more well controlled at home. She denies any low readings such as 90s/60s.   During the day she does not have much energy. If she sits down for 5 minutes, she finds that she is falling asleep. She endorses severe snoring.  She denies any lightheadedness, orthopnea, or PND.   Past Medical History:  Diagnosis Date   Adenomatous polyp 11/23/2005   Anxiety    01/09/22- patient denies anxiety   Asthma 2003   Basal cell carcinoma 2021   right cheek per  patient at [place] [facility]   Carotid stenosis    Chronic diastolic CHF (congestive heart failure) ([place]) 2017   Echo 2021 was normal   COPD (chronic obstructive pulmonary disease) ([place])    Coronary artery disease 2017   a. HC on 08/14/16 showed RCA CTO s/p PCI, 60% LCX disease managed medically with recs to consider staged PCI if continued symptoms.    Depression 2008   pt reports MD diagnosed but she does not feel depressed   Diabetes mellitus without complication ([place])    DIVERTICULOSIS, COLON 04/22/2007        GERD (gastroesophageal reflux  disease) 2012   History of radiation therapy    Right breast- 03/04/22-04/02/22- Dr. Gery Pray   History of shingles    Hypertension 2008   Hypertensive heart disease    Insomnia 2003   Internal hemorrhoids    Osteoarthritis 2008   "knees, hands, lower back" (08/14/2016)   Pap smear for cervical cancer screening 2002   results normal   PUD (peptic ulcer disease) 1989   Seasonal allergies 2013   Sleep apnea    a. resolved with weight loss    Past Surgical History:  Procedure Laterality Date   BREAST BIOPSY Right 12/30/2021   BREAST EXCISIONAL BIOPSY Left    1980's x 3 - all benign   BREAST EXCISIONAL BIOPSY Right    1980's x 1 or 2 - all benign   BREAST LUMPECTOMY WITH RADIOACTIVE SEED LOCALIZATION Right 01/22/2022   Procedure: RIGHT BREAST LUMPECTOMY WITH RADIOACTIVE SEED LOCALIZATION;  Surgeon: Erroll Luna, MD;  Location: [facility];  Service: General;  Laterality: Right;   BREAST SURGERY Left 1980s   Fibrous Tumors removed    CARDIAC CATHETERIZATION N/A 08/14/2016   Procedure: Left Heart Cath and Coronary Angiography;  Surgeon: Sherren Mocha, MD;  Location: [facility];  Service: Cardiovascular;  Laterality: N/A;   CARDIAC CATHETERIZATION N/A 08/14/2016   Procedure: Coronary Stent Intervention;  Surgeon: Sherren Mocha, MD;  Location: [facility];  Service: Cardiovascular;  Laterality: N/A;   CATARACT EXTRACTION W/ INTRAOCULAR LENS  IMPLANT, BILATERAL Bilateral 05/2009   COLONOSCOPY N/A 2016   Cologuard 2019   CORONARY ANGIOPLASTY     detached retina left eye     may 2019   TUBAL LIGATION  1970s    Current Medications: Current Meds  Medication Sig   acetaminophen (TYLENOL) 325 MG tablet Take 650 mg by mouth every 6 (six) hours as needed.   albuterol (VENTOLIN HFA) 108 (90 Base) MCG/ACT inhaler Inhale 2 puffs into the lungs every 6 (six) hours as needed for wheezing or shortness of breath.   ALPRAZolam (XANAX) 1 MG tablet TAKE ONE  TABLET AT BEDTIME AS NEEDED FOR SLEEP -DO NOT DRIVE FOR 8 HOURS AFTER TAKING   amLODipine (NORVASC) 10 MG tablet Take 1 tablet (10 mg total) by mouth daily.   anastrozole (ARIMIDEX) 1 MG tablet Take 1 tablet (1 mg total) by mouth daily.   aspirin EC 81 MG tablet Take 1 tablet (81 mg total) by mouth daily. Swallow whole.   Bempedoic Acid-Ezetimibe (NEXLIZET) 180-10 MG TABS Take 180 mg by mouth daily.   budesonide-formoterol (SYMBICORT) 160-4.5 MCG/ACT inhaler Inhale 2 puffs into the lungs 2 (two) times daily.   carvedilol (COREG) 25 MG tablet Take 1 tablet (25 mg total) by mouth 2 (two) times daily with a meal.   famotidine (PEPCID) 20 MG tablet TAKE ONE TABLET TWICE DAILY   irbesartan (AVAPRO) 300  MG tablet Take 1 tablet (300 mg total) by mouth daily.   levocetirizine (XYZAL) 5 MG tablet Take 1 tablet (5 mg total) by mouth every morning.   omega-3 acid ethyl esters (LOVAZA) 1 g capsule TAKE TWO CAPSULES EVERY DAY   ondansetron (ZOFRAN-ODT) 8 MG disintegrating tablet Take 1 tablet (8 mg total) by mouth every 8 (eight) hours as needed for nausea or vomiting.   spironolactone (ALDACTONE) 25 MG tablet Take 0.5 tablets (12.5 mg total) by mouth daily.   venlafaxine XR (EFFEXOR-XR) 75 MG 24 hr capsule TAKE ONE CAPSULE EACH DAY WITH BREAKFAST     Allergies:   Penicillins, Cephalosporins, Citalopram hydrobromide, Rosuvastatin, Zolpidem tartrate, Zolpidem tartrate, Clarithromycin, and Levofloxacin   Social History   Socioeconomic History   Marital status: Divorced    Spouse name: Not on file   Number of children: Not on file   Years of education: Not on file   Highest education level: Not on file  Occupational History   Occupation: Cleaning  Tobacco Use   Smoking status: Former    Packs/day: 1.00    Years: 26.00    Total pack years: 26.00    Types: Cigarettes    Quit date: 1989    Years since quitting: 34.9   Smokeless tobacco: Never  Vaping Use   Vaping Use: Never used  Substance and  Sexual Activity   Alcohol use: No   Drug use: No   Sexual activity: Not Currently  Other Topics Concern   Not on file  Social History Narrative   Divorced for 32 years in 2016. 2 sons- 1 son had been in prison now living with her in 2019.  2 granddaughters with 1 living close.        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 Best friend [name] patient of Dr. Yong Channel. Enjoys work, time on Teaching laboratory technician.    Social Determinants of Health   Financial Resource Strain: Low Risk  (01/07/2022)   Overall Financial Resource Strain (CARDIA)    Difficulty of Paying Living Expenses: Not very hard  Food Insecurity: No Food Insecurity (01/07/2022)   Hunger Vital Sign    Worried About Running Out of Food in the Last Year: Never true    Ran Out of Food in the Last Year: Never true  Transportation Needs: No Transportation Needs (01/07/2022)   PRAPARE - Hydrologist (Medical): No    Lack of Transportation (Non-Medical): No  Physical Activity: Sufficiently Active (09/29/2021)   Exercise Vital Sign    Days of Exercise per Week: 2 days    Minutes of Exercise per Session: 120 min  Stress: Stress Concern Present (09/29/2021)   Bells    Feeling of Stress : To some extent  Social Connections: Moderately Isolated (09/29/2021)   Social Connection and Isolation Panel [NHANES]    Frequency of Communication with Friends and Family: More than three times a week    Frequency of Social Gatherings with Friends and Family: More than three times a week    Attends Religious Services: More than 4 times per year    Active Member of [employer] or Organizations: No    Attends Archivist Meetings: Never    Marital Status: Divorced     Family History: The patient's family  history includes COPD  in her father; Dementia in her maternal grandmother and mother; Diabetes in her mother; Heart disease in her mother; Hypertension in her mother. There is no history of Colon cancer.  ROS:   Please see the history of present illness.    Review of Systems  Constitutional:  Positive for malaise/fatigue. Negative for chills, fever and weight loss.  HENT:  Negative for hearing loss.   Eyes:  Negative for blurred vision.  Respiratory:  Positive for cough and shortness of breath.   Cardiovascular:  Positive for chest pain, palpitations and leg swelling. Negative for orthopnea, claudication and PND.  Gastrointestinal:  Negative for nausea and vomiting.  Genitourinary:  Negative for hematuria.  Musculoskeletal:  Positive for joint pain (Bilateral knees). Negative for falls.  Neurological:  Positive for loss of consciousness and headaches. Negative for dizziness.  Endo/Heme/Allergies:  Negative for environmental allergies.  Psychiatric/Behavioral:  The patient is not nervous/anxious and does not have insomnia.    All other systems reviewed and negative  EKGs/Labs/Other Studies Reviewed:    The following studies were reviewed today:  NM PET 08/2022:  Findings are consistent with a small region of ischemia. The study is intermediate-to-high risk due to the presence of TID and lack of augmentation of systolic function with stress. There is very mild ischemia noted and normal myocardial blood flow reserve.   LV perfusion is abnormal. Defect 1: There is a small defect with mild reduction in uptake present in the mid inferolateral location(s) that is reversible. There is abnormal wall motion in the defect area. Consistent with ischemia.   Rest left ventricular function is normal. Rest EF: 64 %. Stress left ventricular function is normal, but there was no augmentation in systolic function when compared to rest. Stress EF: 64 %. End diastolic cavity size is normal. End systolic cavity size is normal.    Myocardial blood flow was computed to be 0.49m/g/min at rest and 2.445mg/min at stress. Global myocardial blood flow reserve was 3.00 and was normal.   Coronary calcium was present on the attenuation correction CT images. Severe coronary calcifications were present. Coronary calcifications were present in the left anterior descending artery, left circumflex artery and right coronary artery distribution(s).  Carotids 06/26/22: Summary:  Right Carotid: Velocities in the right ICA are consistent with a 1-39%  stenosis.   Left Carotid: Velocities in the left ICA are consistent with a 60-79%  stenosis.                Non-hemodynamically significant plaque <50% noted in the  CCA. The                ECA appears >50% stenosed.   Vertebrals:  Bilateral vertebral arteries demonstrate antegrade flow.  Subclavians: Normal flow hemodynamics were seen in bilateral subclavian               arteries.   Carotids 06/27/21 Right Carotid: Velocities in the right ICA are consistent with a 1-39%  stenosis.   Left Carotid: Velocities in the left ICA are consistent with a 40-59%  stenosis. Non-hemodynamically significant plaque <50% noted in the  CCA.   Vertebrals:  Bilateral vertebral arteries demonstrate antegrade flow.  Subclavians: Normal flow hemodynamics were seen in bilateral subclavian arteries.  CTA Head/Neck 01/02/21 IMPRESSION: 1. No intracranial arterial occlusion or high-grade stenosis. 2. 60 % stenosis of the proximal left internal carotid artery secondary to predominantly calcified atherosclerosis.   Aortic Atherosclerosis (ICD10-I70.0).  Myoview 02/15/20: 08/12/16 for evaluation of  chest pain and a positive stress test which was ordered by PCP. This demonstrated a hypotensive response to exercise and a medium defect of moderate severity present in the basal inferoseptal, mid inferoseptal and apical septal and mid and apical inferior location that was reversible and consistent with  ischemia.  LHC on 08/14/16 showed RCA CTO s/p complex PCI, 60% LCX disease managed medically with recs to consider staged PCI if continued symptoms. Last stress test 01/2017 was low risk study. Echo 01/2017 showed LVEF of 55-60%, elevated filling pressure, mild to moderate AI.  TTE 02/15/20: IMPRESSIONS   1. Left ventricular ejection fraction, by estimation, is 60 to 65%. The  left ventricle has normal function. The left ventricle has no regional  wall motion abnormalities. The left ventricular internal cavity size was  mildly dilated. There is severe left ventricular hypertrophy. Left ventricular diastolic parameters were normal.   2. Right ventricular systolic function is normal. The right ventricular  size is normal. There is mildly elevated pulmonary artery systolic  pressure.   3. The mitral valve is normal in structure. Mild mitral valve  regurgitation. No evidence of mitral stenosis.   4. The aortic valve is tricuspid. Aortic valve regurgitation is mild.  Sclerosis with small gradient but no stenosis.   5. The inferior vena cava is normal in size with greater than 50%  respiratory variability, suggesting right atrial pressure of 3 mmHg.   Carotid 06/27/20: Summary:  Right Carotid: Velocities in the right ICA are consistent with a 1-39%  stenosis.   Left Carotid: Velocities in the left ICA are consistent with a 40-59%  stenosis. Non-hemodynamically significant plaque <50% noted in the  CCA.   Vertebrals:  Bilateral vertebral arteries demonstrate antegrade flow.   Subclavians: Normal flow hemodynamics were seen in bilateral subclavian arteries.   EKG:  EKG is personally reviewed. 09/15/2022: Sinus rhythm. Rate 64 bpm. 07/29/2022:  EKG was not ordered. 01/15/2022:  NSR at 66 bpm.  Recent Labs: 04/08/2022: ALT 33; Hemoglobin 14.1; Platelets 313.0 08/05/2022: BUN 30; Creatinine, Ser 0.88; Potassium 4.4; Sodium 142   Recent Lipid Panel    Component Value Date/Time   CHOL 116  12/09/2021 0954   CHOL 100 06/22/2019 0907   TRIG 217.0 (H) 12/09/2021 0954   HDL 32.90 (L) 12/09/2021 0954   HDL 32 (L) 06/22/2019 0907   CHOLHDL 4 12/09/2021 0954   VLDL 43.4 (H) 12/09/2021 0954   LDLCALC 48 08/07/2020 0845   LDLDIRECT 53.0 12/09/2021 0954     Risk Assessment/Calculations:       Physical Exam:    VS:  BP 126/68   Pulse 64   Ht 5' (1.524 m)   Wt 168 lb 12.8 oz (76.6 kg)   SpO2 97%   BMI 32.97 kg/m     Wt Readings from Last 3 Encounters:  09/15/22 168 lb 12.8 oz (76.6 kg)  09/09/22 167 lb (75.8 kg)  08/06/22 167 lb 12.8 oz (76.1 kg)     GEN:  Well nourished, well developed in no acute distress HEENT: Normal NECK: No JVD; No carotid bruits CARDIAC: RRR, 2/6 systolic murmur, No rubs, no gallops RESPIRATORY:  Clear to auscultation without rales, wheezing or rhonchi  ABDOMEN: Soft, non-tender, non-distended MUSCULOSKELETAL:  No edema; No deformity  SKIN: Warm and dry NEUROLOGIC:  Alert and oriented x 3 PSYCHIATRIC:  Normal affect   ASSESSMENT:    1. Coronary artery disease involving native coronary artery of native heart with angina pectoris ([place])   2. Pre-procedure lab  exam   3. Shortness of breath   4. DOE (dyspnea on exertion)   5. SOB (shortness of breath)   6. Snoring   7. Daytime somnolence   8. Chronic heart failure with preserved ejection fraction (HCC)   9. Obesity (BMI 30-39.9)   10. Mixed hyperlipidemia   11. Statin intolerance   12. Bilateral carotid artery stenosis   13. Essential hypertension      PLAN:    In order of problems listed above:  #Coronary Artery Disease: #DOE: LHC in 2017 with RCA CTO s/p complex PCI, 60% LCx disease. TTE 01/2020 with LVEF 60-65%.Now with worsening dyspnea on exertion and fatigue. NM PET with mild ischemia in the mid inferolateral wall as well as TID and lack of systolic augmentation with stress. MBFR normal. After discussion with the patient, plan to proceed with cath given degree of symptoms  and known Lcx disease.  -Plan for LHC -Check TTE  -Continue ASA '81mg'$  daily  -Continue irbesartan '300mg'$  daily -Continue coreg 12.'5mg'$  BID -Intolerant to statins; current on nexlizet 180-'10mg'$  daily; LDL well controlled in 50s -Continue lifestyle modifications as detailed below  #HTN: Much better controlled and at goal. -Continue irbesartan '300mg'$  daily -Continue coreg to 12.'5mg'$  BID -Continue amlodipine '10mg'$  daily -Continue spironolactone '25mg'$  daily -Check BMET   #Chronic Diastolic Heart Failure: Euvolemic with NYHA class II symptoms. TTE with LVEF 60-65%, severe LVH. -Not on diuretics -Continue irbesartan '300mg'$  daily -Continue amlodipine '10mg'$  daily -Continue spironolactone '25mg'$  daily -Low Na diet   #HLD: #Statin Intolerance: LDL at goal in 50s.  -Continue nexlizet 180-'10mg'$  daily   #Carotid Artery Disease: LICA with 08-65%, RICA with 1-39% 06/2022. Will continue serial monitoring.  -Continue nexlizet 180-'10mg'$  daily -On ASA '81mg'$  daily  -Repeat carotid ultrasound 06/2023  #Daytime Somnolence: #Snoring: -Check sleep study  #Obesity with BMI 32: -Continue lifestyle modifications as detailed below   Exercise recommendations: Goal of exercising for at least 30 minutes a day, at least 5 times per week.  Please exercise to a moderate exertion.  This means that while exercising it is difficult to speak in full sentences, however you are not so short of breath that you feel you must stop, and not so comfortable that you can carry on a full conversation.  Exertion level should be approximately a 5/10, if 10 is the most exertion you can perform.  Diet recommendations: Recommend a heart healthy diet such as the Mediterranean diet.  This diet consists of plant based foods, healthy fats, lean meats, olive oil.  It suggests limiting the intake of simple carbohydrates such as white breads, pastries, and pastas.  It also limits the amount of red meat, wine, and dairy products such as cheese  that one should consume on a daily basis.  INFORMED CONSENT: I have reviewed the risks, indications, and alternatives to cardiac catheterization, possible angioplasty, and stenting with the patient. Risks include but are not limited to bleeding, infection, vascular injury, stroke, myocardial infection, arrhythmia, kidney injury, radiation-related injury in the case of prolonged fluoroscopy use, emergency cardiac surgery, and death. The patient understands the risks of serious complication is 1-2 in [id number] with diagnostic cardiac cath and 1-2% or less with angioplasty/stenting.    Follow-up:  TBD post catheterization.    Medication Adjustments/Labs and Tests Ordered: Current medicines are reviewed at length with the patient today.  Concerns regarding medicines are outlined above.   Orders Placed This Encounter  Procedures   Basic metabolic panel   CBC w/Diff   EKG 12-Lead  ECHOCARDIOGRAM COMPLETE   [place] Sleep Study   No orders of the defined types were placed in this encounter.  Patient Instructions  Medication Instructions:   Your physician recommends that you continue on your current medications as directed. Please refer to the Current Medication list given to you today.  *If you need a refill on your cardiac medications before your next appointment, please call your pharmacy*   Lab Work:  [facility] 09/23/22--CHECK BMET AND CBC W DIFF  If you have labs (blood work) drawn today and your tests are completely normal, you will receive your results only by: [facility] (if you have MyChart) OR A paper copy in the mail If you have any lab test that is abnormal or we need to change your treatment, we will call you to review the results.   Testing/Procedures:   Your physician has requested that you have an echocardiogram. Echocardiography is a painless test that uses sound waves to create images of your heart. It provides your doctor with information about  the size and shape of your heart and how well your heart's chambers and valves are working. This procedure takes approximately one hour. There are no restrictions for this procedure. Please do NOT wear cologne, perfume, aftershave, or lotions (deodorant is allowed). Please arrive 15 minutes prior to your appointment time.]    Your physician has recommended that you have a [facility] sleep study. This test records several body functions during sleep, including: brain activity, eye movement, oxygen and carbon dioxide blood levels, heart rate and rhythm, breathing rate and rhythm, the flow of air through your mouth and nose, snoring, body muscle movements, and chest and belly movement.  PLEASE FOLLOW ALL DIRECTIONS PROVIDED TO YOU TODAY IN CLINIC ABOUT THIS TEST            Cardiac/Peripheral Catheterization   You are scheduled for a Cardiac Catheterization on Friday, December 1 with Dr. Lauree Chandler.  1. Please arrive at the Main Entrance A at [facility]: [place], [place] [address] on December 1 at 5:30 AM (This time is two hours before your procedure to ensure your preparation). Free valet parking service is available. You will check in at ADMITTING. The support person will be asked to wait in the waiting room.  It is OK to have someone drop you off and come back when you are ready to be discharged.        Special note: Every effort is made to have your procedure done on time. Please understand that emergencies sometimes delay scheduled procedures.   . 2. Diet: Do not eat solid foods after midnight.  You may have clear liquids until 5 AM the day of the procedure.  3. Labs: You will need to have blood drawn on Wednesday, November 29 at [facility]. [address]  Open: 7:30am - 5pm    Phone: [phone]. You do not need to be fasting.  4. Medication instructions in preparation for your procedure:   Contrast  Allergy: No   Stop taking, IRBESARTAN Thursday, November 30,, SPIRONOLACTONE Friday, December 1,   On the morning of your procedure, take Aspirin 81 mg and any morning medicines NOT listed above.  You may use sips of water.  5. Plan to go home the same day, you will only stay overnight if medically necessary. 6. You MUST have a responsible adult to drive you home. 7. An adult MUST  be with you the first 24 hours after you arrive home. 8. Bring a current list of your medications, and the last time and date medication taken. 9. Bring ID and current insurance cards. 10.Please wear clothes that are easy to get on and off and wear slip-on shoes.  Thank you for allowing [place] to care for you!   -- [facility]    Follow-Up:  ONE MONTH WITH AN EXTENDER IN THE OFFICE   Important Information About Sugar        I,Mitra Faeizi,acting as a scribe for [name], MD.,have documented all relevant documentation on the behalf of [name], MD,as directed by  [name], MD while in the presence of [name], MD.   I, [name], MD, have reviewed all documentation for this visit. The documentation on 09/15/22 for the exam, diagnosis, procedures, and orders are all accurate and complete.   Signed, [name], MD  09/15/2022 9:20 AM    [facility]

## 2022-09-15 NOTE — Telephone Encounter (Signed)
Prior Authorization for [facility] sent to [facility] via web portal. Tracking Number . -do not require Pre-Authorization

## 2022-09-15 NOTE — Telephone Encounter (Signed)
Called and made the patient aware that she may proceed with the [facility] Sleep Study. PIN # provided to the patient. Patient made aware that she will be contacted after the test has been read with the results and any recommendations. Patient verbalized understanding and thanked me for the call.   Pt has been given PIN# [id number]. Pt will do sleep study between this weekend and early next week, due to Thanksgiving holiday.

## 2022-09-15 NOTE — Patient Instructions (Signed)
Medication Instructions:   Your physician recommends that you continue on your current medications as directed. Please refer to the Current Medication list given to you today.  *If you need a refill on your cardiac medications before your next appointment, please call your pharmacy*   Lab Work:  [place] 09/23/22--CHECK BMET AND CBC W DIFF  If you have labs (blood work) drawn today and your tests are completely normal, you will receive your results only by: [place] (if you have MyChart) OR A paper copy in the mail If you have any lab test that is abnormal or we need to change your treatment, we will call you to review the results.   Testing/Procedures:   Your physician has requested that you have an echocardiogram. Echocardiography is a painless test that uses sound waves to create images of your heart. It provides your doctor with information about the size and shape of your heart and how well your heart's chambers and valves are working. This procedure takes approximately one hour. There are no restrictions for this procedure. Please do NOT wear cologne, perfume, aftershave, or lotions (deodorant is allowed). Please arrive 15 minutes prior to your appointment time.]    Your physician has recommended that you have a ITAMAR sleep study. This test records several body functions during sleep, including: brain activity, eye movement, oxygen and carbon dioxide blood levels, heart rate and rhythm, breathing rate and rhythm, the flow of air through your mouth and nose, snoring, body muscle movements, and chest and belly movement.  PLEASE FOLLOW ALL DIRECTIONS PROVIDED TO YOU TODAY IN CLINIC ABOUT THIS TEST            Cardiac/Peripheral Catheterization   You are scheduled for a Cardiac Catheterization on Friday, December 1 with Dr. Lauree Chandler.  1. Please arrive at the Main Entrance A at [facility]: [place], [place]  [id number] on December 1 at 5:30 AM (This time is two hours before your procedure to ensure your preparation). Free valet parking service is available. You will check in at ADMITTING. The support person will be asked to wait in the waiting room.  It is OK to have someone drop you off and come back when you are ready to be discharged.        Special note: Every effort is made to have your procedure done on time. Please understand that emergencies sometimes delay scheduled procedures.   . 2. Diet: Do not eat solid foods after midnight.  You may have clear liquids until 5 AM the day of the procedure.  3. Labs: You will need to have blood drawn on Wednesday, November 29 at [facility]. [address]  Open: 7:30am - 5pm    Phone: [phone]. You do not need to be fasting.  4. Medication instructions in preparation for your procedure:   Contrast Allergy: No   Stop taking, IRBESARTAN Thursday, November 30,, SPIRONOLACTONE Friday, December 1,   On the morning of your procedure, take Aspirin 81 mg and any morning medicines NOT listed above.  You may use sips of water.  5. Plan to go home the same day, you will only stay overnight if medically necessary. 6. You MUST have a responsible adult to drive you home. 7. An adult MUST be with you the first 24 hours after you arrive home. 8. Bring a current list of your medications, and the last time and date  medication taken. 9. Bring ID and current insurance cards. 10.Please wear clothes that are easy to get on and off and wear slip-on shoes.  Thank you for allowing [place] to care for you!   -- [facility]    Follow-Up:  ONE MONTH WITH AN EXTENDER IN THE OFFICE   Important Information About Sugar

## 2022-09-15 NOTE — Progress Notes (Signed)
Cardiology Office Note:    Date:  09/15/2022   ID:  Felicia Perry, Felicia Perry November 24, 1945, MRN [id number]  PCP:  Marin Olp, MD   [facility] Providers Cardiologist:  [name], MD {   Referring MD: Marin Olp, MD    History of Present Illness:    Felicia Perry is a 76 y.o. female with a hx of CAD (dx 2017 - RCA CTO s/p complex PCI, 60% LCx disease, normal LVEF 08/14/16),  chronic diastolic CHF, carotid artery disease, HLD with statin intolerance, HTN, PUD, asthma, anxiety who was previously followed by Dr. Meda Coffee who now presents to clinic for follow-up of CAD and CHF.  Per review of the record, the patient was seen by Dr. Meda Coffee on 08/12/16 for evaluation of chest pain and a positive stress test which was ordered by PCP. This demonstrated a hypotensive response to exercise and a medium defect of moderate severity present in the basal inferoseptal, mid inferoseptal and apical septal and mid and apical inferior location that was reversible and consistent with ischemia.  LHC on 08/14/16 showed RCA CTO s/p complex PCI, 60% LCX disease managed medically with recs to consider staged PCI if continued symptoms. Myoview  01/2017 was low risk study. TTE 01/2017 showed LVEF of 55-60%, elevated filling pressure, mild to moderate AI.  Last myoview 02/15/20 with EF 71%, mild defect in apex but no ischemia. TTE 02/15/20 with LVEF 60-65%, severe LVH, mild MR, aortic sclerosis. Carotid 06/27/20 with RICA 0-08% and LICA 67-61%.  Was last seen in clinic on 12/24/21 where she was doing well from a CV standpoint. Carotid ultrasound 06/2022 with 95-09% in the LICA and 3-26% RICA.   Was last seen in clinic on 07/2022 where she was having episodes of SOB with exertion like walking to the mailbox. We obtained a cardiac PET which showed mid inferolateral ischemia but with TID and lack of augmentation of systolic function with stress. MBFR was normal. Discussion held with the patient and given  known Lcx disease and symptoms, decision was made to proceed with cath.  Today, the patient states that she is feeling extremely tired. Walking up to the office just now was difficult and left her feeling weak and short of breath. She is also short winded with short walks such as from her bed to the bathroom. This is concerning as she is typically an active person. No chest pain but she notes that she "just feels off."  Sometimes she does lose consciousness if she develops a severe coughing spell. This usually lasts for a few seconds while she is sitting on the cough or the bed. She denies falling; she only blacks out for a few seconds before regaining consciousness.   Additionally she complains of leaning to the right all the time. She is not sure of the etiology, but notes that she still keeps her balance despite leaning over to the right while moving.  She has noticed that her blood pressure is more well controlled at home. She denies any low readings such as 90s/60s.   During the day she does not have much energy. If she sits down for 5 minutes, she finds that she is falling asleep. She endorses severe snoring.  She denies any lightheadedness, orthopnea, or PND.   Past Medical History:  Diagnosis Date   Adenomatous polyp 11/23/2005   Anxiety    01/09/22- patient denies anxiety   Asthma 2003   Basal cell carcinoma 2021   right cheek per  patient at [facility] [facility]   Carotid stenosis    Chronic diastolic CHF (congestive heart failure) ([place]) 2017   Echo 2021 was normal   COPD (chronic obstructive pulmonary disease) ([place])    Coronary artery disease 2017   a. HC on 08/14/16 showed RCA CTO s/p PCI, 60% LCX disease managed medically with recs to consider staged PCI if continued symptoms.    Depression 2008   pt reports MD diagnosed but she does not feel depressed   Diabetes mellitus without complication ([place])    DIVERTICULOSIS, COLON 04/22/2007        GERD (gastroesophageal reflux  disease) 2012   History of radiation therapy    Right breast- 03/04/22-04/02/22- Dr. Gery Pray   History of shingles    Hypertension 2008   Hypertensive heart disease    Insomnia 2003   Internal hemorrhoids    Osteoarthritis 2008   "knees, hands, lower back" (08/14/2016)   Pap smear for cervical cancer screening 2002   results normal   PUD (peptic ulcer disease) 1989   Seasonal allergies 2013   Sleep apnea    a. resolved with weight loss    Past Surgical History:  Procedure Laterality Date   BREAST BIOPSY Right 12/30/2021   BREAST EXCISIONAL BIOPSY Left    1980's x 3 - all benign   BREAST EXCISIONAL BIOPSY Right    1980's x 1 or 2 - all benign   BREAST LUMPECTOMY WITH RADIOACTIVE SEED LOCALIZATION Right 01/22/2022   Procedure: RIGHT BREAST LUMPECTOMY WITH RADIOACTIVE SEED LOCALIZATION;  Surgeon: Erroll Luna, MD;  Location: [facility];  Service: General;  Laterality: Right;   BREAST SURGERY Left 1980s   Fibrous Tumors removed    CARDIAC CATHETERIZATION N/A 08/14/2016   Procedure: Left Heart Cath and Coronary Angiography;  Surgeon: Sherren Mocha, MD;  Location: [facility];  Service: Cardiovascular;  Laterality: N/A;   CARDIAC CATHETERIZATION N/A 08/14/2016   Procedure: Coronary Stent Intervention;  Surgeon: Sherren Mocha, MD;  Location: [facility];  Service: Cardiovascular;  Laterality: N/A;   CATARACT EXTRACTION W/ INTRAOCULAR LENS  IMPLANT, BILATERAL Bilateral 05/2009   COLONOSCOPY N/A 2016   Cologuard 2019   CORONARY ANGIOPLASTY     detached retina left eye     may 2019   TUBAL LIGATION  1970s    Current Medications: Current Meds  Medication Sig   acetaminophen (TYLENOL) 325 MG tablet Take 650 mg by mouth every 6 (six) hours as needed.   albuterol (VENTOLIN HFA) 108 (90 Base) MCG/ACT inhaler Inhale 2 puffs into the lungs every 6 (six) hours as needed for wheezing or shortness of breath.   ALPRAZolam (XANAX) 1 MG tablet TAKE ONE  TABLET AT BEDTIME AS NEEDED FOR SLEEP -DO NOT DRIVE FOR 8 HOURS AFTER TAKING   amLODipine (NORVASC) 10 MG tablet Take 1 tablet (10 mg total) by mouth daily.   anastrozole (ARIMIDEX) 1 MG tablet Take 1 tablet (1 mg total) by mouth daily.   aspirin EC 81 MG tablet Take 1 tablet (81 mg total) by mouth daily. Swallow whole.   Bempedoic Acid-Ezetimibe (NEXLIZET) 180-10 MG TABS Take 180 mg by mouth daily.   budesonide-formoterol (SYMBICORT) 160-4.5 MCG/ACT inhaler Inhale 2 puffs into the lungs 2 (two) times daily.   carvedilol (COREG) 25 MG tablet Take 1 tablet (25 mg total) by mouth 2 (two) times daily with a meal.   famotidine (PEPCID) 20 MG tablet TAKE ONE TABLET TWICE DAILY   irbesartan (AVAPRO) 300  MG tablet Take 1 tablet (300 mg total) by mouth daily.   levocetirizine (XYZAL) 5 MG tablet Take 1 tablet (5 mg total) by mouth every morning.   omega-3 acid ethyl esters (LOVAZA) 1 g capsule TAKE TWO CAPSULES EVERY DAY   ondansetron (ZOFRAN-ODT) 8 MG disintegrating tablet Take 1 tablet (8 mg total) by mouth every 8 (eight) hours as needed for nausea or vomiting.   spironolactone (ALDACTONE) 25 MG tablet Take 0.5 tablets (12.5 mg total) by mouth daily.   venlafaxine XR (EFFEXOR-XR) 75 MG 24 hr capsule TAKE ONE CAPSULE EACH DAY WITH BREAKFAST     Allergies:   Penicillins, Cephalosporins, Citalopram hydrobromide, Rosuvastatin, Zolpidem tartrate, Zolpidem tartrate, Clarithromycin, and Levofloxacin   Social History   Socioeconomic History   Marital status: Divorced    Spouse name: Not on file   Number of children: Not on file   Years of education: Not on file   Highest education level: Not on file  Occupational History   Occupation: Cleaning  Tobacco Use   Smoking status: Former    Packs/day: 1.00    Years: 26.00    Total pack years: 26.00    Types: Cigarettes    Quit date: 1989    Years since quitting: 34.9   Smokeless tobacco: Never  Vaping Use   Vaping Use: Never used  Substance and  Sexual Activity   Alcohol use: No   Drug use: No   Sexual activity: Not Currently  Other Topics Concern   Not on file  Social History Narrative   Divorced for 36 years in 2016. 2 sons- 1 son had been in prison now living with her in 2019.  2 granddaughters with 1 living close.        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 Best friend [name] patient of Dr. Yong Channel. Enjoys work, time on Teaching laboratory technician.    Social Determinants of Health   Financial Resource Strain: Low Risk  (01/07/2022)   Overall Financial Resource Strain (CARDIA)    Difficulty of Paying Living Expenses: Not very hard  Food Insecurity: No Food Insecurity (01/07/2022)   Hunger Vital Sign    Worried About Running Out of Food in the Last Year: Never true    Ran Out of Food in the Last Year: Never true  Transportation Needs: No Transportation Needs (01/07/2022)   PRAPARE - Hydrologist (Medical): No    Lack of Transportation (Non-Medical): No  Physical Activity: Sufficiently Active (09/29/2021)   Exercise Vital Sign    Days of Exercise per Week: 2 days    Minutes of Exercise per Session: 120 min  Stress: Stress Concern Present (09/29/2021)   [place]    Feeling of Stress : To some extent  Social Connections: Moderately Isolated (09/29/2021)   Social Connection and Isolation Panel [NHANES]    Frequency of Communication with Friends and Family: More than three times a week    Frequency of Social Gatherings with Friends and Family: More than three times a week    Attends Religious Services: More than 4 times per year    Active Member of [employer] or Organizations: No    Attends Archivist Meetings: Never    Marital Status: Divorced     Family History: The patient's family  history includes COPD  in her father; Dementia in her maternal grandmother and mother; Diabetes in her mother; Heart disease in her mother; Hypertension in her mother. There is no history of Colon cancer.  ROS:   Please see the history of present illness.    Review of Systems  Constitutional:  Positive for malaise/fatigue. Negative for chills, fever and weight loss.  HENT:  Negative for hearing loss.   Eyes:  Negative for blurred vision.  Respiratory:  Positive for cough and shortness of breath.   Cardiovascular:  Positive for chest pain, palpitations and leg swelling. Negative for orthopnea, claudication and PND.  Gastrointestinal:  Negative for nausea and vomiting.  Genitourinary:  Negative for hematuria.  Musculoskeletal:  Positive for joint pain (Bilateral knees). Negative for falls.  Neurological:  Positive for loss of consciousness and headaches. Negative for dizziness.  Endo/Heme/Allergies:  Negative for environmental allergies.  Psychiatric/Behavioral:  The patient is not nervous/anxious and does not have insomnia.    All other systems reviewed and negative  EKGs/Labs/Other Studies Reviewed:    The following studies were reviewed today:  NM PET 08/2022:  Findings are consistent with a small region of ischemia. The study is intermediate-to-high risk due to the presence of TID and lack of augmentation of systolic function with stress. There is very mild ischemia noted and normal myocardial blood flow reserve.   LV perfusion is abnormal. Defect 1: There is a small defect with mild reduction in uptake present in the mid inferolateral location(s) that is reversible. There is abnormal wall motion in the defect area. Consistent with ischemia.   Rest left ventricular function is normal. Rest EF: 64 %. Stress left ventricular function is normal, but there was no augmentation in systolic function when compared to rest. Stress EF: 64 %. End diastolic cavity size is normal. End systolic cavity size is normal.    Myocardial blood flow was computed to be 0.37m/g/min at rest and 2.466mg/min at stress. Global myocardial blood flow reserve was 3.00 and was normal.   Coronary calcium was present on the attenuation correction CT images. Severe coronary calcifications were present. Coronary calcifications were present in the left anterior descending artery, left circumflex artery and right coronary artery distribution(s).  Carotids 06/26/22: Summary:  Right Carotid: Velocities in the right ICA are consistent with a 1-39%  stenosis.   Left Carotid: Velocities in the left ICA are consistent with a 60-79%  stenosis.                Non-hemodynamically significant plaque <50% noted in the  CCA. The                ECA appears >50% stenosed.   Vertebrals:  Bilateral vertebral arteries demonstrate antegrade flow.  Subclavians: Normal flow hemodynamics were seen in bilateral subclavian               arteries.   Carotids 06/27/21 Right Carotid: Velocities in the right ICA are consistent with a 1-39%  stenosis.   Left Carotid: Velocities in the left ICA are consistent with a 40-59%  stenosis. Non-hemodynamically significant plaque <50% noted in the  CCA.   Vertebrals:  Bilateral vertebral arteries demonstrate antegrade flow.  Subclavians: Normal flow hemodynamics were seen in bilateral subclavian arteries.  CTA Head/Neck 01/02/21 IMPRESSION: 1. No intracranial arterial occlusion or high-grade stenosis. 2. 60 % stenosis of the proximal left internal carotid artery secondary to predominantly calcified atherosclerosis.   Aortic Atherosclerosis (ICD10-I70.0).  Myoview 02/15/20: 08/12/16 for evaluation of  chest pain and a positive stress test which was ordered by PCP. This demonstrated a hypotensive response to exercise and a medium defect of moderate severity present in the basal inferoseptal, mid inferoseptal and apical septal and mid and apical inferior location that was reversible and consistent with  ischemia.  LHC on 08/14/16 showed RCA CTO s/p complex PCI, 60% LCX disease managed medically with recs to consider staged PCI if continued symptoms. Last stress test 01/2017 was low risk study. Echo 01/2017 showed LVEF of 55-60%, elevated filling pressure, mild to moderate AI.  TTE 02/15/20: IMPRESSIONS   1. Left ventricular ejection fraction, by estimation, is 60 to 65%. The  left ventricle has normal function. The left ventricle has no regional  wall motion abnormalities. The left ventricular internal cavity size was  mildly dilated. There is severe left ventricular hypertrophy. Left ventricular diastolic parameters were normal.   2. Right ventricular systolic function is normal. The right ventricular  size is normal. There is mildly elevated pulmonary artery systolic  pressure.   3. The mitral valve is normal in structure. Mild mitral valve  regurgitation. No evidence of mitral stenosis.   4. The aortic valve is tricuspid. Aortic valve regurgitation is mild.  Sclerosis with small gradient but no stenosis.   5. The inferior vena cava is normal in size with greater than 50%  respiratory variability, suggesting right atrial pressure of 3 mmHg.   Carotid 06/27/20: Summary:  Right Carotid: Velocities in the right ICA are consistent with a 1-39%  stenosis.   Left Carotid: Velocities in the left ICA are consistent with a 40-59%  stenosis. Non-hemodynamically significant plaque <50% noted in the  CCA.   Vertebrals:  Bilateral vertebral arteries demonstrate antegrade flow.   Subclavians: Normal flow hemodynamics were seen in bilateral subclavian arteries.   EKG:  EKG is personally reviewed. 09/15/2022: Sinus rhythm. Rate 64 bpm. 07/29/2022:  EKG was not ordered. 01/15/2022:  NSR at 66 bpm.  Recent Labs: 04/08/2022: ALT 33; Hemoglobin 14.1; Platelets 313.0 08/05/2022: BUN 30; Creatinine, Ser 0.88; Potassium 4.4; Sodium 142   Recent Lipid Panel    Component Value Date/Time   CHOL 116  12/09/2021 0954   CHOL 100 06/22/2019 0907   TRIG 217.0 (H) 12/09/2021 0954   HDL 32.90 (L) 12/09/2021 0954   HDL 32 (L) 06/22/2019 0907   CHOLHDL 4 12/09/2021 0954   VLDL 43.4 (H) 12/09/2021 0954   LDLCALC 48 08/07/2020 0845   LDLDIRECT 53.0 12/09/2021 0954     Risk Assessment/Calculations:       Physical Exam:    VS:  BP 126/68   Pulse 64   Ht 5' (1.524 m)   Wt 168 lb 12.8 oz (76.6 kg)   SpO2 97%   BMI 32.97 kg/m     Wt Readings from Last 3 Encounters:  09/15/22 168 lb 12.8 oz (76.6 kg)  09/09/22 167 lb (75.8 kg)  08/06/22 167 lb 12.8 oz (76.1 kg)     GEN:  Well nourished, well developed in no acute distress HEENT: Normal NECK: No JVD; No carotid bruits CARDIAC: RRR, 2/6 systolic murmur, No rubs, no gallops RESPIRATORY:  Clear to auscultation without rales, wheezing or rhonchi  ABDOMEN: Soft, non-tender, non-distended MUSCULOSKELETAL:  No edema; No deformity  SKIN: Warm and dry NEUROLOGIC:  Alert and oriented x 3 PSYCHIATRIC:  Normal affect   ASSESSMENT:    1. Coronary artery disease involving native coronary artery of native heart with angina pectoris ([place])   2. Pre-procedure lab  exam   3. Shortness of breath   4. DOE (dyspnea on exertion)   5. SOB (shortness of breath)   6. Snoring   7. Daytime somnolence   8. Chronic heart failure with preserved ejection fraction (HCC)   9. Obesity (BMI 30-39.9)   10. Mixed hyperlipidemia   11. Statin intolerance   12. Bilateral carotid artery stenosis   13. Essential hypertension      PLAN:    In order of problems listed above:  #Coronary Artery Disease: #DOE: LHC in 2017 with RCA CTO s/p complex PCI, 60% LCx disease. TTE 01/2020 with LVEF 60-65%.Now with worsening dyspnea on exertion and fatigue. NM PET with mild ischemia in the mid inferolateral wall as well as TID and lack of systolic augmentation with stress. MBFR normal. After discussion with the patient, plan to proceed with cath given degree of symptoms  and known Lcx disease.  -Plan for LHC -Check TTE  -Continue ASA '81mg'$  daily  -Continue irbesartan '300mg'$  daily -Continue coreg 12.'5mg'$  BID -Intolerant to statins; current on nexlizet 180-'10mg'$  daily; LDL well controlled in 50s -Continue lifestyle modifications as detailed below  #HTN: Much better controlled and at goal. -Continue irbesartan '300mg'$  daily -Continue coreg to 12.'5mg'$  BID -Continue amlodipine '10mg'$  daily -Continue spironolactone '25mg'$  daily -Check BMET   #Chronic Diastolic Heart Failure: Euvolemic with NYHA class II symptoms. TTE with LVEF 60-65%, severe LVH. -Not on diuretics -Continue irbesartan '300mg'$  daily -Continue amlodipine '10mg'$  daily -Continue spironolactone '25mg'$  daily -Low Na diet   #HLD: #Statin Intolerance: LDL at goal in 50s.  -Continue nexlizet 180-'10mg'$  daily   #Carotid Artery Disease: LICA with 16-10%, RICA with 1-39% 06/2022. Will continue serial monitoring.  -Continue nexlizet 180-'10mg'$  daily -On ASA '81mg'$  daily  -Repeat carotid ultrasound 06/2023  #Daytime Somnolence: #Snoring: -Check sleep study  #Obesity with BMI 32: -Continue lifestyle modifications as detailed below   Exercise recommendations: Goal of exercising for at least 30 minutes a day, at least 5 times per week.  Please exercise to a moderate exertion.  This means that while exercising it is difficult to speak in full sentences, however you are not so short of breath that you feel you must stop, and not so comfortable that you can carry on a full conversation.  Exertion level should be approximately a 5/10, if 10 is the most exertion you can perform.  Diet recommendations: Recommend a heart healthy diet such as the Mediterranean diet.  This diet consists of plant based foods, healthy fats, lean meats, olive oil.  It suggests limiting the intake of simple carbohydrates such as white breads, pastries, and pastas.  It also limits the amount of red meat, wine, and dairy products such as cheese  that one should consume on a daily basis.  INFORMED CONSENT: I have reviewed the risks, indications, and alternatives to cardiac catheterization, possible angioplasty, and stenting with the patient. Risks include but are not limited to bleeding, infection, vascular injury, stroke, myocardial infection, arrhythmia, kidney injury, radiation-related injury in the case of prolonged fluoroscopy use, emergency cardiac surgery, and death. The patient understands the risks of serious complication is 1-2 in [id number] with diagnostic cardiac cath and 1-2% or less with angioplasty/stenting.    Follow-up:  TBD post catheterization.    Medication Adjustments/Labs and Tests Ordered: Current medicines are reviewed at length with the patient today.  Concerns regarding medicines are outlined above.   Orders Placed This Encounter  Procedures   Basic metabolic panel   CBC w/Diff   EKG 12-Lead  ECHOCARDIOGRAM COMPLETE   [place] Sleep Study   No orders of the defined types were placed in this encounter.  Patient Instructions  Medication Instructions:   Your physician recommends that you continue on your current medications as directed. Please refer to the Current Medication list given to you today.  *If you need a refill on your cardiac medications before your next appointment, please call your pharmacy*   Lab Work:  [facility] 09/23/22--CHECK BMET AND CBC W DIFF  If you have labs (blood work) drawn today and your tests are completely normal, you will receive your results only by: [place] (if you have MyChart) OR A paper copy in the mail If you have any lab test that is abnormal or we need to change your treatment, we will call you to review the results.   Testing/Procedures:   Your physician has requested that you have an echocardiogram. Echocardiography is a painless test that uses sound waves to create images of your heart. It provides your doctor with information about  the size and shape of your heart and how well your heart's chambers and valves are working. This procedure takes approximately one hour. There are no restrictions for this procedure. Please do NOT wear cologne, perfume, aftershave, or lotions (deodorant is allowed). Please arrive 15 minutes prior to your appointment time.]    Your physician has recommended that you have a [facility]AMAR sleep study. This test records several body functions during sleep, including: brain activity, eye movement, oxygen and carbon dioxide blood levels, heart rate and rhythm, breathing rate and rhythm, the flow of air through your mouth and nose, snoring, body muscle movements, and chest and belly movement.  PLEASE FOLLOW ALL DIRECTIONS PROVIDED TO YOU TODAY IN CLINIC ABOUT THIS TEST            Cardiac/Peripheral Catheterization   You are scheduled for a Cardiac Catheterization on Friday, December 1 with Dr. Lauree Chandler.  1. Please arrive at the Main Entrance A at [facility]: [place], [place] [address] on December 1 at 5:30 AM (This time is two hours before your procedure to ensure your preparation). Free valet parking service is available. You will check in at ADMITTING. The support person will be asked to wait in the waiting room.  It is OK to have someone drop you off and come back when you are ready to be discharged.        Special note: Every effort is made to have your procedure done on time. Please understand that emergencies sometimes delay scheduled procedures.   . 2. Diet: Do not eat solid foods after midnight.  You may have clear liquids until 5 AM the day of the procedure.  3. Labs: You will need to have blood drawn on Wednesday, November 29 at [facility] at [facility]. [address]  Open: 7:30am - 5pm    Phone: [phone]. You do not need to be fasting.  4. Medication instructions in preparation for your procedure:   Contrast  Allergy: No   Stop taking, IRBESARTAN Thursday, November 30,, SPIRONOLACTONE Friday, December 1,   On the morning of your procedure, take Aspirin 81 mg and any morning medicines NOT listed above.  You may use sips of water.  5. Plan to go home the same day, you will only stay overnight if medically necessary. 6. You MUST have a responsible adult to drive you home. 7. An adult MUST  be with you the first 24 hours after you arrive home. 8. Bring a current list of your medications, and the last time and date medication taken. 9. Bring ID and current insurance cards. 10.Please wear clothes that are easy to get on and off and wear slip-on shoes.  Thank you for allowing [place] to care for you!   -- [facility]    Follow-Up:  ONE MONTH WITH AN EXTENDER IN THE OFFICE   Important Information About Sugar        I,Mitra Faeizi,acting as a scribe for [name], MD.,have documented all relevant documentation on the behalf of [name], MD,as directed by  [name], MD while in the presence of [name], MD.   I, [name], MD, have reviewed all documentation for this visit. The documentation on 09/15/22 for the exam, diagnosis, procedures, and orders are all accurate and complete.   Signed, [name], MD  09/15/2022 9:20 AM    [facility]

## 2022-09-15 NOTE — Telephone Encounter (Signed)
Pt agreeable to signed waiver and to not open the box until called with PIN#.

## 2022-09-21 DIAGNOSIS — D0511 Intraductal carcinoma in situ of right breast: Secondary | ICD-10-CM | POA: Diagnosis not present

## 2022-09-23 ENCOUNTER — Ambulatory Visit: Payer: Medicare Other | Attending: Cardiology

## 2022-09-23 DIAGNOSIS — Z01812 Encounter for preprocedural laboratory examination: Secondary | ICD-10-CM | POA: Diagnosis not present

## 2022-09-23 DIAGNOSIS — I25119 Atherosclerotic heart disease of native coronary artery with unspecified angina pectoris: Secondary | ICD-10-CM

## 2022-09-23 DIAGNOSIS — R0609 Other forms of dyspnea: Secondary | ICD-10-CM | POA: Diagnosis not present

## 2022-09-23 DIAGNOSIS — R0602 Shortness of breath: Secondary | ICD-10-CM

## 2022-09-23 LAB — CBC WITH DIFFERENTIAL/PLATELET
Basophils Absolute: 0.1 10*3/uL (ref 0.0–0.2)
Basos: 1 %
EOS (ABSOLUTE): 0.3 10*3/uL (ref 0.0–0.4)
Eos: 3 %
Hematocrit: 41.1 % (ref 34.0–46.6)
Hemoglobin: 14 g/dL (ref 11.1–15.9)
Lymphocytes Absolute: 1.3 10*3/uL (ref 0.7–3.1)
Lymphs: 15 %
MCH: 30.1 pg (ref 26.6–33.0)
MCHC: 34.1 g/dL (ref 31.5–35.7)
MCV: 88 fL (ref 79–97)
Monocytes Absolute: 1 10*3/uL — ABNORMAL HIGH (ref 0.1–0.9)
Monocytes: 11 %
Neutrophils Absolute: 6.3 10*3/uL (ref 1.4–7.0)
Neutrophils: 70 %
Platelets: 311 10*3/uL (ref 150–450)
RBC: 4.65 x10E6/uL (ref 3.77–5.28)
RDW: 13.2 % (ref 11.7–15.4)
WBC: 9 10*3/uL (ref 3.4–10.8)

## 2022-09-23 LAB — BASIC METABOLIC PANEL
BUN/Creatinine Ratio: 29 — ABNORMAL HIGH (ref 12–28)
BUN: 30 mg/dL — ABNORMAL HIGH (ref 8–27)
CO2: 26 mmol/L (ref 20–29)
Calcium: 10.1 mg/dL (ref 8.7–10.3)
Chloride: 102 mmol/L (ref 96–106)
Creatinine, Ser: 1.02 mg/dL — ABNORMAL HIGH (ref 0.57–1.00)
Glucose: 128 mg/dL — ABNORMAL HIGH (ref 70–99)
Potassium: 5.1 mmol/L (ref 3.5–5.2)
Sodium: 142 mmol/L (ref 134–144)
eGFR: 57 mL/min/{1.73_m2} — ABNORMAL LOW (ref >59)

## 2022-09-24 ENCOUNTER — Telehealth: Payer: Self-pay | Admitting: *Deleted

## 2022-09-24 NOTE — Telephone Encounter (Signed)
Error

## 2022-09-24 NOTE — Telephone Encounter (Signed)
Cardiac Catheterization scheduled at [facility] for: Friday September 25, 2022 7:30 AM Arrival time and place: [place] Entrance A at: 5:30 AM  Nothing to eat after midnight prior to procedure, clear liquids until 5 AM day of procedure.  Medication instructions: -Hold:  Spironolactone/Avapro day before and day of procedure -per protocol GFR 57 -Except hold medications usual morning medications can be taken with sips of water including aspirin 81 mg.  Confirmed patient has responsible adult to drive home post procedure and be with patient first 24 hours after arriving home.  Patient reports no new symptoms concerning for COVID-19 in the past 10 days.  Reviewed procedure instructions with patient.

## 2022-09-25 ENCOUNTER — Encounter (HOSPITAL_COMMUNITY)
Admission: RE | Disposition: A | Discharge status: Home / Self Care | Payer: Self-pay | Source: Ambulatory Visit | Attending: Cardiovascular Disease

## 2022-09-25 ENCOUNTER — Other Ambulatory Visit: Payer: Self-pay

## 2022-09-25 ENCOUNTER — Encounter (HOSPITAL_COMMUNITY): Payer: Self-pay | Admitting: Cardiovascular Disease

## 2022-09-25 ENCOUNTER — Ambulatory Visit (HOSPITAL_COMMUNITY)
Admission: RE | Admit: 2022-09-25 | Discharge: 2022-09-25 | Disposition: A | Discharge status: Home / Self Care | Payer: Medicare Other | Source: Ambulatory Visit | Attending: Cardiovascular Disease | Admitting: Cardiovascular Disease

## 2022-09-25 DIAGNOSIS — Z7982 Long term (current) use of aspirin: Secondary | ICD-10-CM | POA: Insufficient documentation

## 2022-09-25 DIAGNOSIS — R0683 Snoring: Secondary | ICD-10-CM | POA: Diagnosis not present

## 2022-09-25 DIAGNOSIS — E669 Obesity, unspecified: Secondary | ICD-10-CM | POA: Insufficient documentation

## 2022-09-25 DIAGNOSIS — F419 Anxiety disorder, unspecified: Secondary | ICD-10-CM | POA: Insufficient documentation

## 2022-09-25 DIAGNOSIS — Z955 Presence of coronary angioplasty implant and graft: Secondary | ICD-10-CM | POA: Diagnosis not present

## 2022-09-25 DIAGNOSIS — Z87891 Personal history of nicotine dependence: Secondary | ICD-10-CM | POA: Insufficient documentation

## 2022-09-25 DIAGNOSIS — I25119 Atherosclerotic heart disease of native coronary artery with unspecified angina pectoris: Secondary | ICD-10-CM | POA: Diagnosis not present

## 2022-09-25 DIAGNOSIS — I5032 Chronic diastolic (congestive) heart failure: Secondary | ICD-10-CM | POA: Diagnosis not present

## 2022-09-25 DIAGNOSIS — E782 Mixed hyperlipidemia: Secondary | ICD-10-CM | POA: Diagnosis not present

## 2022-09-25 DIAGNOSIS — R0602 Shortness of breath: Secondary | ICD-10-CM

## 2022-09-25 DIAGNOSIS — R400 Somnolence: Secondary | ICD-10-CM | POA: Insufficient documentation

## 2022-09-25 DIAGNOSIS — Z6832 Body mass index (BMI) 32.0-32.9, adult: Secondary | ICD-10-CM | POA: Diagnosis not present

## 2022-09-25 DIAGNOSIS — I110 Hypertensive heart disease with heart failure: Secondary | ICD-10-CM | POA: Diagnosis not present

## 2022-09-25 DIAGNOSIS — I2510 Atherosclerotic heart disease of native coronary artery without angina pectoris: Secondary | ICD-10-CM | POA: Diagnosis present

## 2022-09-25 DIAGNOSIS — J45909 Unspecified asthma, uncomplicated: Secondary | ICD-10-CM | POA: Insufficient documentation

## 2022-09-25 DIAGNOSIS — I6523 Occlusion and stenosis of bilateral carotid arteries: Secondary | ICD-10-CM | POA: Diagnosis not present

## 2022-09-25 DIAGNOSIS — Z79899 Other long term (current) drug therapy: Secondary | ICD-10-CM | POA: Diagnosis not present

## 2022-09-25 HISTORY — PX: LEFT HEART CATH AND CORONARY ANGIOGRAPHY: CATH118249

## 2022-09-25 SURGERY — LEFT HEART CATH AND CORONARY ANGIOGRAPHY
Anesthesia: LOCAL

## 2022-09-25 MED ORDER — SODIUM CHLORIDE 0.9% FLUSH: 3.0000 mL | INTRAVENOUS | Status: DC | PRN

## 2022-09-25 MED ORDER — SODIUM CHLORIDE 0.9 % IV SOLN: INTRAVENOUS | Status: AC

## 2022-09-25 MED ORDER — HEPARIN SODIUM (PORCINE) 1000 UNIT/ML IJ SOLN
INTRAMUSCULAR | Status: DC | PRN
  Administered 2022-09-25: 4000 [IU] via INTRAVENOUS

## 2022-09-25 MED ORDER — SODIUM CHLORIDE 0.9 % WEIGHT BASED INFUSION
3.0000 mL/kg/h | INTRAVENOUS | Status: AC
  Administered 2022-09-25: 3 mL/kg/h via INTRAVENOUS

## 2022-09-25 MED ORDER — VERAPAMIL HCL 2.5 MG/ML IV SOLN
INTRAVENOUS | Status: AC
  Filled 2022-09-25: qty 2

## 2022-09-25 MED ORDER — HEPARIN (PORCINE) IN NACL 1000-0.9 UT/500ML-% IV SOLN
INTRAVENOUS | Status: AC
  Filled 2022-09-25: qty 500

## 2022-09-25 MED ORDER — SODIUM CHLORIDE 0.9 % IV SOLN: 250.0000 mL | INTRAVENOUS | Status: DC | PRN

## 2022-09-25 MED ORDER — HYDRALAZINE HCL 20 MG/ML IJ SOLN: 10.0000 mg | INTRAMUSCULAR | Status: DC | PRN

## 2022-09-25 MED ORDER — ACETAMINOPHEN 325 MG PO TABS: 650.0000 mg | ORAL_TABLET | ORAL | Status: DC | PRN

## 2022-09-25 MED ORDER — ASPIRIN 81 MG PO CHEW: 81.0000 mg | CHEWABLE_TABLET | ORAL | Status: DC

## 2022-09-25 MED ORDER — SODIUM CHLORIDE 0.9% FLUSH: 3.0000 mL | Freq: Two times a day (BID) | INTRAVENOUS | Status: DC

## 2022-09-25 MED ORDER — IOHEXOL 350 MG/ML SOLN
INTRAVENOUS | Status: DC | PRN
  Administered 2022-09-25: 50 mL

## 2022-09-25 MED ORDER — HEPARIN (PORCINE) IN NACL 1000-0.9 UT/500ML-% IV SOLN
INTRAVENOUS | Status: DC | PRN
  Administered 2022-09-25 (×2): 500 mL

## 2022-09-25 MED ORDER — SODIUM CHLORIDE 0.9 % WEIGHT BASED INFUSION: 1.0000 mL/kg/h | INTRAVENOUS | Status: DC

## 2022-09-25 MED ORDER — HEPARIN SODIUM (PORCINE) 1000 UNIT/ML IJ SOLN
INTRAMUSCULAR | Status: AC
  Filled 2022-09-25: qty 10

## 2022-09-25 MED ORDER — VERAPAMIL HCL 2.5 MG/ML IV SOLN
INTRAVENOUS | Status: DC | PRN
  Administered 2022-09-25: 10 mL via INTRA_ARTERIAL

## 2022-09-25 MED ORDER — MIDAZOLAM HCL 2 MG/2ML IJ SOLN
INTRAMUSCULAR | Status: AC
  Filled 2022-09-25: qty 2

## 2022-09-25 MED ORDER — LABETALOL HCL 5 MG/ML IV SOLN: 10.0000 mg | INTRAVENOUS | Status: DC | PRN

## 2022-09-25 MED ORDER — FENTANYL CITRATE (PF) 100 MCG/2ML IJ SOLN
INTRAMUSCULAR | Status: DC | PRN
  Administered 2022-09-25: 25 ug via INTRAVENOUS

## 2022-09-25 MED ORDER — ONDANSETRON HCL 4 MG/2ML IJ SOLN: 4.0000 mg | Freq: Four times a day (QID) | INTRAMUSCULAR | Status: DC | PRN

## 2022-09-25 MED ORDER — LIDOCAINE HCL (PF) 1 % IJ SOLN
INTRAMUSCULAR | Status: DC | PRN
  Administered 2022-09-25: 2 mL via INTRADERMAL

## 2022-09-25 MED ORDER — FENTANYL CITRATE (PF) 100 MCG/2ML IJ SOLN
INTRAMUSCULAR | Status: AC
  Filled 2022-09-25: qty 2

## 2022-09-25 MED ORDER — MIDAZOLAM HCL 2 MG/2ML IJ SOLN
INTRAMUSCULAR | Status: DC | PRN
  Administered 2022-09-25: 1 mg via INTRAVENOUS

## 2022-09-25 MED ORDER — LIDOCAINE HCL (PF) 1 % IJ SOLN
INTRAMUSCULAR | Status: AC
  Filled 2022-09-25: qty 30

## 2022-09-25 SURGICAL SUPPLY — 10 items
CATH 5FR JL3.5 JR4 ANG PIG MP (CATHETERS) IMPLANT
DEVICE RAD COMP TR BAND LRG (VASCULAR PRODUCTS) IMPLANT
GLIDESHEATH SLEND SS 6F .021 (SHEATH) IMPLANT
GUIDEWIRE INQWIRE 1.5J.035X260 (WIRE) IMPLANT
INQWIRE 1.5J .035X260CM (WIRE) ×1
KIT HEART LEFT (KITS) ×2 IMPLANT
PACK CARDIAC CATHETERIZATION (CUSTOM PROCEDURE TRAY) ×2 IMPLANT
SHEATH PROBE COVER 6X72 (BAG) IMPLANT
TRANSDUCER W/STOPCOCK (MISCELLANEOUS) ×2 IMPLANT
TUBING CIL FLEX 10 FLL-RA (TUBING) ×2 IMPLANT

## 2022-09-25 NOTE — Progress Notes (Signed)
Pt and son received d/c instructions, written and verbal. All questions and concerns addressed. PIVs removed and all intact. Dressing to procedure site intact along w/ arm brace. Denies any acute pain or discomfort. Pt in stable condition

## 2022-09-25 NOTE — Interval H&P Note (Signed)
History and Physical Interval Note:  09/25/2022 7:16 AM  Felicia Perry  has presented today for surgery, with the diagnosis of abnormal pet scan.  The various methods of treatment have been discussed with the patient and family. After consideration of risks, benefits and other options for treatment, the patient has consented to  Procedure(s): LEFT HEART CATH AND CORONARY ANGIOGRAPHY (N/A) as a surgical intervention.  The patient's history has been reviewed, patient examined, no change in status, stable for surgery.  I have reviewed the patient's chart and labs.  Questions were answered to the patient's satisfaction.    Cath Lab Visit (complete for each Cath Lab visit)  Clinical Evaluation Leading to the Procedure:   ACS: No.  Non-ACS:    Anginal Classification: CCS III  Anti-ischemic medical therapy: Maximal Therapy (2 or more classes of medications)  Non-Invasive Test Results: No non-invasive testing performed  Prior CABG: No previous CABG        Lauree Chandler

## 2022-09-25 NOTE — Progress Notes (Signed)
TR band removed, new dressing in place along w/ arm brace. No bleeding or hematoma noted to site. will continue to monitor

## 2022-09-30 NOTE — Telephone Encounter (Signed)
Prior Authorization for ITAMAR sent to [employer] via web portal. Tracking Number . do not require Pre-Authorization by [place] 

## 2022-09-30 NOTE — Telephone Encounter (Signed)
THIS IS A DUPLICATE AS THE SLEEP STUDY HAS BEEN DONE

## 2022-10-01 ENCOUNTER — Telehealth: Payer: Self-pay | Admitting: Pharmacist

## 2022-10-01 NOTE — Progress Notes (Signed)
Chronic Care Management Pharmacy Assistant   Name: Felicia Perry  MRN: [id number] DOB: 1946-10-05   Reason for Encounter: Hypertension Adherence Call    Recent office visits:  None  Recent consult visits:  09/15/2022 OV (Cardiology) [name], MD; no medication changes indicated.  08/06/2022 OV (Oncology) [name], NP; doxycycline for cellulitis  Hospital visits:  09/25/2022 Admission for Left Heart Cath and Coronary Angiography  Medications: Outpatient Encounter Medications as of 10/01/2022  Medication Sig   acetaminophen (TYLENOL) 325 MG tablet Take 650 mg by mouth every 6 (six) hours as needed for mild pain, moderate pain or headache.   albuterol (VENTOLIN HFA) 108 (90 Base) MCG/ACT inhaler Inhale 2 puffs into the lungs every 6 (six) hours as needed for wheezing or shortness of breath.   ALPRAZolam (XANAX) 1 MG tablet TAKE ONE TABLET AT BEDTIME AS NEEDED FOR SLEEP -DO NOT DRIVE FOR 8 HOURS AFTER TAKING (Patient taking differently: Take 1 mg by mouth at bedtime. DO NOT DRIVE FOR 8 HOURS AFTER TAKING)   amLODipine (NORVASC) 10 MG tablet Take 1 tablet (10 mg total) by mouth daily.   anastrozole (ARIMIDEX) 1 MG tablet Take 1 tablet (1 mg total) by mouth daily.   aspirin EC 81 MG tablet Take 1 tablet (81 mg total) by mouth daily. Swallow whole.   Bempedoic Acid-Ezetimibe (NEXLIZET) 180-10 MG TABS Take 180 mg by mouth daily.   budesonide-formoterol (SYMBICORT) 160-4.5 MCG/ACT inhaler Inhale 2 puffs into the lungs 2 (two) times daily.   carvedilol (COREG) 25 MG tablet Take 1 tablet (25 mg total) by mouth 2 (two) times daily with a meal.   famotidine (PEPCID) 20 MG tablet TAKE ONE TABLET TWICE DAILY   irbesartan (AVAPRO) 300 MG tablet Take 1 tablet (300 mg total) by mouth daily.   levocetirizine (XYZAL) 5 MG tablet Take 1 tablet (5 mg total) by mouth every morning.   omega-3 acid ethyl esters (LOVAZA) 1 g capsule TAKE TWO CAPSULES EVERY DAY   ondansetron  (ZOFRAN-ODT) 8 MG disintegrating tablet Take 1 tablet (8 mg total) by mouth every 8 (eight) hours as needed for nausea or vomiting. (Patient not taking: Reported on 09/22/2022)   spironolactone (ALDACTONE) 25 MG tablet Take 0.5 tablets (12.5 mg total) by mouth daily.   venlafaxine XR (EFFEXOR-XR) 75 MG 24 hr capsule TAKE ONE CAPSULE EACH DAY WITH BREAKFAST   No facility-administered encounter medications on file as of 10/01/2022.   Reviewed chart prior to disease state call. Spoke with patient regarding BP  Recent Office Vitals: BP Readings from Last 3 Encounters:  09/25/22 107/60  09/15/22 126/68  09/09/22 (!) 142/88   Pulse Readings from Last 3 Encounters:  09/25/22 62  09/15/22 64  09/09/22 62    Wt Readings from Last 3 Encounters:  09/25/22 165 lb (74.8 kg)  09/15/22 168 lb 12.8 oz (76.6 kg)  09/09/22 167 lb (75.8 kg)     Kidney Function Lab Results  Component Value Date/Time   CREATININE 1.02 (H) 09/23/2022 11:55 AM   CREATININE 0.88 08/05/2022 01:52 PM   CREATININE 0.90 01/07/2022 09:02 AM   CREATININE 0.89 08/07/2020 08:45 AM   CREATININE 0.94 (H) 10/20/2016 09:34 AM   GFR 71.89 04/08/2022 11:07 AM   GFRNONAA >60 01/07/2022 09:02 AM   GFRNONAA 64 08/07/2020 08:45 AM   GFRAA 74 08/07/2020 08:45 AM       Latest Ref Rng & Units 09/23/2022   11:55 AM 08/05/2022    1:52 PM 04/08/2022  11:07 AM  BMP  Glucose 70 - 99 mg/dL 128  144  151   BUN 8 - 27 mg/dL '30  30  19   '$ Creatinine 0.57 - 1.00 mg/dL 1.02  0.88  0.80   BUN/Creat Ratio 12 - 28 29  34    Sodium 134 - 144 mmol/L 142  142  140   Potassium 3.5 - 5.2 mmol/L 5.1  4.4  3.6   Chloride 96 - 106 mmol/L 102  100  100   CO2 20 - 29 mmol/L '26  27  29   '$ Calcium 8.7 - 10.3 mg/dL 10.1  9.7  9.9     Current antihypertensive regimen:  Amlodipine 10 mg  daily Carvedilol 25 mg  two times daily Irbesartan 300 mg daily Spironolactone 25 mg 1/2 tablet daily  How often are you checking your Blood Pressure?  daily  Current home BP readings: 135/76, 129/66, 133/68, 135/67, 135/68  What recent interventions/DTPs have been made by any provider to improve Blood Pressure control since last CPP Visit: No recent interventions DTPs.  Any recent hospitalizations or ED visits since last visit with CPP? No  What diet changes have been made to improve Blood Pressure Control?  No recent diet changes. Patient states she has stopped eating between meals, and has cut back on portion sizes.  What exercise is being done to improve your Blood Pressure Control?  Patient states she is just now getting her strength back from recent heart cath.  Adherence Review: Is the patient currently on ACE/ARB medication? Yes Does the patient have >5 day gap between last estimated fill dates? No  Care Gaps: Medicare Annual Wellness: Scheduled 10/12/2022 Ophthalmology Exam: Next due on 12/26/2022 Foot Exam: Overdue since 08/12/2022 Hemoglobin A1C: 6.4% on 04/08/2022 Colonoscopy: Discontinued Fecal DNA (Cologuard): Next due on 01/15/2025 Dexa Scan: Completed Mammogram: Scheduled 11/18/2022  Future Appointments  Date Time Provider [place]  10/06/2022  1:50 PM [facility] ECHO 2 [id number]  10/12/2022  8:45 AM [facility] HEALTH COACH [facility] PEC  10/13/2022  9:45 AM [name], MD [facility] None  10/13/2022 10:30 AM Elgie Collard, PA-C [facility]  10/14/2022  8:15 AM [name], DO [facility] None  10/21/2022  1:30 PM Marylu Lund., NP [facility]  10/27/2022  3:30 PM [facility] PHARMACIST [facility]  11/12/2022  3:20 PM Marin Olp, MD [facility] PEC  11/18/2022  8:40 AM GI-BCG DIAG TOMO 1 GI-BCGMM GI-BREAST CE  02/05/2023 11:30 AM [facility] CCM PHARMACIST [facility] PEC  06/30/2023  1:30 PM MC-CV NL VASC 3 MC-SECVI [facility]   Star Rating Drugs: Irbesartan 300 mg last filled 08/28/2022 90 DS  April D Calhoun, [place] Pharmacist  Assistant [phone]

## 2022-10-06 ENCOUNTER — Ambulatory Visit: Payer: Medicare Other | Admitting: Hematology and Oncology

## 2022-10-06 ENCOUNTER — Ambulatory Visit (HOSPITAL_COMMUNITY): Payer: Medicare Other | Attending: Cardiovascular Disease

## 2022-10-06 DIAGNOSIS — I2510 Atherosclerotic heart disease of native coronary artery without angina pectoris: Secondary | ICD-10-CM | POA: Diagnosis not present

## 2022-10-06 DIAGNOSIS — I080 Rheumatic disorders of both mitral and aortic valves: Secondary | ICD-10-CM | POA: Diagnosis not present

## 2022-10-06 DIAGNOSIS — R0602 Shortness of breath: Secondary | ICD-10-CM | POA: Insufficient documentation

## 2022-10-06 DIAGNOSIS — J449 Chronic obstructive pulmonary disease, unspecified: Secondary | ICD-10-CM | POA: Insufficient documentation

## 2022-10-06 DIAGNOSIS — E119 Type 2 diabetes mellitus without complications: Secondary | ICD-10-CM | POA: Diagnosis not present

## 2022-10-06 DIAGNOSIS — I110 Hypertensive heart disease with heart failure: Secondary | ICD-10-CM | POA: Insufficient documentation

## 2022-10-06 DIAGNOSIS — I509 Heart failure, unspecified: Secondary | ICD-10-CM | POA: Diagnosis not present

## 2022-10-06 DIAGNOSIS — R0600 Dyspnea, unspecified: Secondary | ICD-10-CM | POA: Insufficient documentation

## 2022-10-06 LAB — ECHOCARDIOGRAM COMPLETE
AR max vel: 1.4 cm2
AV Area VTI: 1.32 cm2
AV Area mean vel: 1.5 cm2
AV Mean grad: 13 mmHg
AV Peak grad: 24.8 mmHg
Ao pk vel: 2.49 m/s
Area-P 1/2: 3.72 cm2
P 1/2 time: 451 msec
S' Lateral: 3.1 cm

## 2022-10-07 ENCOUNTER — Telehealth: Payer: Self-pay | Admitting: *Deleted

## 2022-10-07 DIAGNOSIS — I350 Nonrheumatic aortic (valve) stenosis: Secondary | ICD-10-CM

## 2022-10-07 DIAGNOSIS — I340 Nonrheumatic mitral (valve) insufficiency: Secondary | ICD-10-CM

## 2022-10-07 NOTE — Telephone Encounter (Signed)
-----   Message from [name], MD sent at 10/06/2022  8:58 PM EST ----- Her echo shows normal pumping function. There is mild narrowing of her aortic valve and mild leakiness of her mitral valve. We will watch the valves with repeat echoes every year (mainly to watch her aortic valve).   Her heart is also stiff (diastolic dysfunction) which means we need to continue with blood pressure control going forward.

## 2022-10-07 NOTE — Telephone Encounter (Signed)
The patient has been notified of the result and verbalized understanding.  All questions (if any) were answered.  Pt aware that I will go ahead and place the order for repeat echo in one year in the system and send a message to our [facility] Schedulers to call her back and arrange this appt for that time.   Pt aware to always maintain good BP control, continue her meds, and watch her salt intake.  Pt verbalized understanding and agrees with this plan.

## 2022-10-08 ENCOUNTER — Encounter: Payer: Self-pay | Admitting: *Deleted

## 2022-10-09 NOTE — Progress Notes (Deleted)
[place] Phone: [phone] Subjective:    I'm seeing this patient by the request  of:  Marin Olp, MD  CC: knee pain, back pain   HCW:[id number]  Felicia Perry is a 76 y.o. female coming in with complaint of back and neck pain. OMT 07/03/2022. Also f/u for B knee pain. Patient states   Medications patient has been prescribed: None  Taking:         Reviewed prior external information including notes and imaging from previsou exam, outside providers and external EMR if available.   As well as notes that were available from care everywhere and other healthcare systems.  Past medical history, social, surgical and family history all reviewed in electronic medical record.  No pertanent information unless stated regarding to the chief complaint.   Past Medical History:  Diagnosis Date   Adenomatous polyp 11/23/2005   Anxiety    01/09/22- patient denies anxiety   Asthma 2003   Basal cell carcinoma 2021   right cheek per patient at [place] [facility]   Carotid stenosis    Chronic diastolic CHF (congestive heart failure) ([place]) 2017   Echo 2021 was normal   COPD (chronic obstructive pulmonary disease) ([place])    Coronary artery disease 2017   a. HC on 08/14/16 showed RCA CTO s/p PCI, 60% LCX disease managed medically with recs to consider staged PCI if continued symptoms.    Depression 2008   pt reports MD diagnosed but she does not feel depressed   Diabetes mellitus without complication ([place])    DIVERTICULOSIS, COLON 04/22/2007        GERD (gastroesophageal reflux disease) 2012   History of radiation therapy    Right breast- 03/04/22-04/02/22- Dr. Gery Pray   History of shingles    Hypertension 2008   Hypertensive heart disease    Insomnia 2003   Internal hemorrhoids    Osteoarthritis 2008   "knees, hands, lower back" (08/14/2016)   Pap smear for cervical cancer screening 2002   results  normal   PUD (peptic ulcer disease) 1989   Seasonal allergies 2013   Sleep apnea    a. resolved with weight loss    Allergies  Allergen Reactions   Penicillins Anaphylaxis    REACTION: per patient causes rash,hives   Cephalosporins Swelling    REACTION: tongue swelling   Citalopram Hydrobromide Other (See Comments)    psychosis   Rosuvastatin Other (See Comments)    Pt reports causes bilateral lower extremity muscle aches.    Zolpidem Tartrate     REACTION: difficulty with concentration   Clarithromycin Rash    REACTION: blisters in mouth   Levofloxacin Rash    REACTION: tongue swells     Review of Systems:  No headache, visual changes, nausea, vomiting, diarrhea, constipation, dizziness, abdominal pain, skin rash, fevers, chills, night sweats, weight loss, swollen lymph nodes, body aches, joint swelling, chest pain, shortness of breath, mood changes. POSITIVE muscle aches  Objective  There were no vitals taken for this visit.   General: No apparent distress alert and oriented x3 mood and affect normal, dressed appropriately.  HEENT: Pupils equal, extraocular movements intact  Respiratory: Patient's speak in full sentences and does not appear short of breath  Cardiovascular: No lower extremity edema, non tender, no erythema  Knee exam shows   Back exam shows   Osteopathic findings  C2 flexed rotated and side bent right C6 flexed rotated and  side bent left T3 extended rotated and side bent right inhaled rib T9 extended rotated and side bent left L2 flexed rotated and side bent right Sacrum right on right       Assessment and Plan:  No problem-specific Assessment & Plan notes found for this encounter.    Nonallopathic problems  Decision today to treat with OMT was based on Physical Exam  After verbal consent patient was treated with HVLA, ME, FPR techniques in cervical, rib, thoracic, lumbar, and sacral  areas  Patient tolerated the procedure well with  improvement in symptoms  Patient given exercises, stretches and lifestyle modifications  See medications in patient instructions if given  Patient will follow up in 4-8 weeks    The above documentation has been reviewed and is accurate and complete [name], DO          Note: This dictation was prepared with Dragon dictation along with smaller phrase technology. Any transcriptional errors that result from this process are unintentional.

## 2022-10-12 NOTE — Progress Notes (Unsigned)
Office Visit    Patient Name: Felicia Perry Date of Encounter: 10/13/2022  PCP:  Marin Olp, [place]  Cardiologist:  [name], MD  Advanced Practice Provider:  No care team member to display Electrophysiologist:  None   HPI    Felicia Perry is a 76 y.o. female with a past medical history significant for CAD (Dx 2017 with RCA CTO status post complex PCI, 60% LCx disease, normal LVEF 08/14/2016), chronic diastolic CHF, carotid artery disease, HLD with statin intolerance, HTN, PUD, asthma, anxiety presents today for follow-up appointment.  Per review of her chart, patient was seen by Dr. Meda Coffee on 08/12/2016 for evaluation of chest pain and a positive stress test which was ordered by PCP.  This demonstrated a hypertensive response to exercise and a medium defect of moderate severity present in the basal inferior septal, mid inferior septal and apical septal and mid and apical inferior location that was reversible and consistent with ischemia.  LHC on 08/14/2016 showed RCA CTO status post complex PCI, 60% LCx disease manage medically with recs to consider staged PCI if continued symptoms.  Myoview 01/2017 was a low risk study.  TTE 01/2017 showed LVEF 55 to 60%, elevated filling pressures, mild to moderate AI.  Last Myoview 02/15/2020 with EF 71%, mild defect in the apex with no ischemia.  TTE 02/15/2020 with LVEF 66 5%, severe LVH, mild MR, aortic sclerosis without stenosis.  Carotid ultrasound 06/27/2020 with R ICA 1 to 29% and LICA 40 to 79%.  Was seen in clinic 12/24/2021 where she was doing well from CV standpoint.  Carotid ultrasound 06/2022 with 60 to 89% in the LICA and 1 to 21% in the R ICA.  Was seen in the clinic 07/2022 where she was having episodes of shortness of breath with exertion like walking mailbox.  We obtain a cardiac PET which showed small region of ischemia. Considered intermediate to high risk due to the presence of TID and  lack of augmentation of systolic function with stress.  Discussion held with the patient and given known LCx disease and symptoms, decision was to proceed with cardiac catheterization.  She was seen by Dr. Johney Frame 09/15/2022 and was feeling extremely tired.  Walking in the office was very difficult and left her feeling weak and short of breath.  She was also short winded with short walks such as from her bed to bathroom.  This was concerning as she typically was an active person.  No chest pain but "just feels off".  Sometimes she does lose consciousness if she develops a severe coughing spell.  Usually last for few seconds while she is sitting on the side of the bed.  She denies falling, only blacked out for few seconds before regaining consciousness.  Additionally, she complains of leaning to the right all the time.  Not sure of the etiology of the notes that she still keeps her balance despite leaning over to the right.  She has noticed her blood pressure is better controlled at home.  Denies low blood pressure readings.  During the day she does not have much energy.  If she sits down for 5 minutes, she found herself falling asleep.  Endorses severe snoring.  Sleep study at this time was suggested.  Today, she tells me she is doing everything that she can as far as dietary changes and an increase in exercise.  She has been under a lot of stress because  her ceiling is coming down.  She has been trying to increase physical activity by riding a bike to and from the park.  She has not had a chance to do her sleep study but her daughter-in-law is going to help her set it up in the next 2 weeks.  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  She also seems a little anxious today and very worried about losing her independence.  Although, she feels she is still able to do everything that she wants to do and still works full-time.  We reviewed her recent A1c  which was 6.4.  She has been trying to maintain a low sugar, low-sodium diet.  We reviewed her most recent testing including her echocardiogram, cardiac catheterization, and carotid ultrasound.   Reports no chest pain, pressure, or tightness. No edema, orthopnea, PND. Reports no palpitations.  Past Medical History    Past Medical History:  Diagnosis Date   Adenomatous polyp 11/23/2005   Anxiety    01/09/22- patient denies anxiety   Asthma 2003   Basal cell carcinoma 2021   right cheek per patient at [facility]   Carotid stenosis    Chronic diastolic CHF (congestive heart failure) ([place]) 2017   Echo 2021 was normal   COPD (chronic obstructive pulmonary disease) ([place])    Coronary artery disease 2017   a. HC on 08/14/16 showed RCA CTO s/p PCI, 60% LCX disease managed medically with recs to consider staged PCI if continued symptoms.    Depression 2008   pt reports MD diagnosed but she does not feel depressed   Diabetes mellitus without complication ([place])    DIVERTICULOSIS, COLON 04/22/2007        GERD (gastroesophageal reflux disease) 2012   History of radiation therapy    Right breast- 03/04/22-04/02/22- Dr. Gery Pray   History of shingles    Hypertension 2008   Hypertensive heart disease    Insomnia 2003   Internal hemorrhoids    Osteoarthritis 2008   "knees, hands, lower back" (08/14/2016)   Pap smear for cervical cancer screening 2002   results normal   PUD (peptic ulcer disease) 1989   Seasonal allergies 2013   Sleep apnea    a. resolved with weight loss   Past Surgical History:  Procedure Laterality Date   BREAST BIOPSY Right 12/30/2021   BREAST EXCISIONAL BIOPSY Left    1980's x 3 - all benign   BREAST EXCISIONAL BIOPSY Right    1980's x 1 or 2 - all benign   BREAST LUMPECTOMY WITH RADIOACTIVE SEED LOCALIZATION Right 01/22/2022   Procedure: RIGHT BREAST LUMPECTOMY WITH RADIOACTIVE SEED LOCALIZATION;  Surgeon: Erroll Luna, MD;  Location: [facility];  Service: General;  Laterality: Right;   BREAST SURGERY Left 1980s   Fibrous Tumors removed    CARDIAC CATHETERIZATION N/A 08/14/2016   Procedure: Left Heart Cath and Coronary Angiography;  Surgeon: Sherren Mocha, MD;  Location: [facility];  Service: Cardiovascular;  Laterality: N/A;   CARDIAC CATHETERIZATION N/A 08/14/2016   Procedure: Coronary Stent Intervention;  Surgeon: Sherren Mocha, MD;  Location: [facility];  Service: Cardiovascular;  Laterality: N/A;   CATARACT EXTRACTION W/ INTRAOCULAR LENS  IMPLANT, BILATERAL Bilateral 05/2009   COLONOSCOPY N/A 2016   Cologuard 2019   CORONARY ANGIOPLASTY     detached retina left eye     may 2019   LEFT HEART CATH AND CORONARY ANGIOGRAPHY N/A  09/25/2022   Procedure: LEFT HEART CATH AND CORONARY ANGIOGRAPHY;  Surgeon: Burnell Blanks, MD;  Location: [facility];  Service: Cardiovascular;  Laterality: N/A;   TUBAL LIGATION  1970s    Allergies  Allergies  Allergen Reactions   Penicillins Anaphylaxis    REACTION: per patient causes rash,hives   Cephalosporins Swelling    REACTION: tongue swelling   Citalopram Hydrobromide Other (See Comments)    psychosis   Rosuvastatin Other (See Comments)    Pt reports causes bilateral lower extremity muscle aches.    Zolpidem Tartrate     REACTION: difficulty with concentration   Clarithromycin Rash    REACTION: blisters in mouth   Levofloxacin Rash    REACTION: tongue swells     EKGs/Labs/Other Studies Reviewed:   The following studies were reviewed today:  NM PET 08/2022:  Findings are consistent with a small region of ischemia. The study is intermediate-to-high risk due to the presence of TID and lack of augmentation of systolic function with stress. There is very mild ischemia noted and normal myocardial blood flow reserve.   LV perfusion is abnormal. Defect 1: There is a small defect with mild reduction in uptake present in the mid  inferolateral location(s) that is reversible. There is abnormal wall motion in the defect area. Consistent with ischemia.   Rest left ventricular function is normal. Rest EF: 64 %. Stress left ventricular function is normal, but there was no augmentation in systolic function when compared to rest. Stress EF: 64 %. End diastolic cavity size is normal. End systolic cavity size is normal.   Myocardial blood flow was computed to be 0.37m/g/min at rest and 2.441mg/min at stress. Global myocardial blood flow reserve was 3.00 and was normal.   Coronary calcium was present on the attenuation correction CT images. Severe coronary calcifications were present. Coronary calcifications were present in the left anterior descending artery, left circumflex artery and right coronary artery distribution(s).   Carotids 06/26/22: Summary:  Right Carotid: Velocities in the right ICA are consistent with a 1-39%  stenosis.   Left Carotid: Velocities in the left ICA are consistent with a 60-79%  stenosis.                Non-hemodynamically significant plaque <50% noted in the  CCA. The                ECA appears >50% stenosed.   Vertebrals:  Bilateral vertebral arteries demonstrate antegrade flow.  Subclavians: Normal flow hemodynamics were seen in bilateral subclavian               arteries.    Carotids 06/27/21 Right Carotid: Velocities in the right ICA are consistent with a 1-39%  stenosis.   Left Carotid: Velocities in the left ICA are consistent with a 40-59%  stenosis. Non-hemodynamically significant plaque <50% noted in the  CCA.   Vertebrals:  Bilateral vertebral arteries demonstrate antegrade flow.  Subclavians: Normal flow hemodynamics were seen in bilateral subclavian arteries.   CTA Head/Neck 01/02/21 IMPRESSION: 1. No intracranial arterial occlusion or high-grade stenosis. 2. 60 % stenosis of the proximal left internal carotid artery secondary to predominantly calcified atherosclerosis.    Aortic Atherosclerosis (ICD10-I70.0).   Myoview 02/15/20: 08/12/16 for evaluation of chest pain and a positive stress test which was ordered by PCP. This demonstrated a hypotensive response to exercise and a medium defect of moderate severity present in the basal inferoseptal, mid inferoseptal and apical septal and mid and apical inferior  location that was reversible and consistent with ischemia.  LHC on 08/14/16 showed RCA CTO s/p complex PCI, 60% LCX disease managed medically with recs to consider staged PCI if continued symptoms. Last stress test 01/2017 was low risk study. Echo 01/2017 showed LVEF of 55-60%, elevated filling pressure, mild to moderate AI.   TTE 02/15/20: IMPRESSIONS   1. Left ventricular ejection fraction, by estimation, is 60 to 65%. The  left ventricle has normal function. The left ventricle has no regional  wall motion abnormalities. The left ventricular internal cavity size was  mildly dilated. There is severe left ventricular hypertrophy. Left ventricular diastolic parameters were normal.   2. Right ventricular systolic function is normal. The right ventricular  size is normal. There is mildly elevated pulmonary artery systolic  pressure.   3. The mitral valve is normal in structure. Mild mitral valve  regurgitation. No evidence of mitral stenosis.   4. The aortic valve is tricuspid. Aortic valve regurgitation is mild.  Sclerosis with small gradient but no stenosis.   5. The inferior vena cava is normal in size with greater than 50%  respiratory variability, suggesting right atrial pressure of 3 mmHg.    Carotid 06/27/20: Summary:  Right Carotid: Velocities in the right ICA are consistent with a 1-39%  stenosis.   Left Carotid: Velocities in the left ICA are consistent with a 40-59%  stenosis. Non-hemodynamically significant plaque <50% noted in the  CCA.   Vertebrals:  Bilateral vertebral arteries demonstrate antegrade flow.   Subclavians: Normal flow  hemodynamics were seen in bilateral subclavian arteries.     EKG:  EKG is not ordered today.  Recent Labs: 04/08/2022: ALT 33 09/23/2022: BUN 30; Creatinine, Ser 1.02; Hemoglobin 14.0; Platelets 311; Potassium 5.1; Sodium 142  Recent Lipid Panel    Component Value Date/Time   CHOL 116 12/09/2021 0954   CHOL 100 06/22/2019 0907   TRIG 217.0 (H) 12/09/2021 0954   HDL 32.90 (L) 12/09/2021 0954   HDL 32 (L) 06/22/2019 0907   CHOLHDL 4 12/09/2021 0954   VLDL 43.4 (H) 12/09/2021 0954   LDLCALC 48 08/07/2020 0845   LDLDIRECT 53.0 12/09/2021 0954    Home Medications   Current Meds  Medication Sig   acetaminophen (TYLENOL) 325 MG tablet Take 650 mg by mouth every 6 (six) hours as needed for mild pain, moderate pain or headache.   albuterol (VENTOLIN HFA) 108 (90 Base) MCG/ACT inhaler Inhale 2 puffs into the lungs every 6 (six) hours as needed for wheezing or shortness of breath.   ALPRAZolam (XANAX) 1 MG tablet TAKE ONE TABLET AT BEDTIME AS NEEDED FOR SLEEP -DO NOT DRIVE FOR 8 HOURS AFTER TAKING (Patient taking differently: Take 1 mg by mouth at bedtime. DO NOT DRIVE FOR 8 HOURS AFTER TAKING)   amLODipine (NORVASC) 10 MG tablet Take 1 tablet (10 mg total) by mouth daily.   anastrozole (ARIMIDEX) 1 MG tablet Take 1 tablet (1 mg total) by mouth daily.   aspirin EC 81 MG tablet Take 1 tablet (81 mg total) by mouth daily. Swallow whole.   Bempedoic Acid-Ezetimibe (NEXLIZET) 180-10 MG TABS Take 180 mg by mouth daily.   budesonide-formoterol (SYMBICORT) 160-4.5 MCG/ACT inhaler Inhale 2 puffs into the lungs 2 (two) times daily.   carvedilol (COREG) 25 MG tablet Take 1 tablet (25 mg total) by mouth 2 (two) times daily with a meal.   famotidine (PEPCID) 20 MG tablet TAKE ONE TABLET TWICE DAILY   irbesartan (AVAPRO) 300 MG tablet Take 1  tablet (300 mg total) by mouth daily.   levocetirizine (XYZAL) 5 MG tablet Take 1 tablet (5 mg total) by mouth every morning.   omega-3 acid ethyl esters (LOVAZA) 1  g capsule TAKE TWO CAPSULES EVERY DAY   ondansetron (ZOFRAN-ODT) 8 MG disintegrating tablet Take 1 tablet (8 mg total) by mouth every 8 (eight) hours as needed for nausea or vomiting.   spironolactone (ALDACTONE) 25 MG tablet Take 0.5 tablets (12.5 mg total) by mouth daily.   venlafaxine XR (EFFEXOR-XR) 75 MG 24 hr capsule TAKE ONE CAPSULE EACH DAY WITH BREAKFAST     Review of Systems      All other systems reviewed and are otherwise negative except as noted above.  Physical Exam    VS:  BP (!) 144/78 (BP Location: Left Arm, Patient Position: Sitting, Cuff Size: Normal)   Pulse (!) 50   Ht 5' (1.524 m)   Wt 163 lb 9.6 oz (74.2 kg)   SpO2 96%   BMI 31.95 kg/m  , BMI Body mass index is 31.95 kg/m.  Wt Readings from Last 3 Encounters:  10/13/22 163 lb 9.6 oz (74.2 kg)  09/25/22 165 lb (74.8 kg)  09/15/22 168 lb 12.8 oz (76.6 kg)     GEN: Well nourished, well developed, in no acute distress. HEENT: normal. Neck: Supple, no JVD, carotid bruits, or masses. Cardiac: RRR, no murmurs, rubs, or gallops. No clubbing, cyanosis, edema.  Radials/PT 2+ and equal bilaterally.  Respiratory:  Respirations regular and unlabored, clear to auscultation bilaterally. GI: Soft, nontender, nondistended. MS: No deformity or atrophy. Skin: Warm and dry, no rash. Neuro:  Strength and sensation are intact. Psych: Normal affect.  Assessment & Plan    Coronary artery disease/DOE -No chest pain -She has had SOB, cold makes it worse -Continue GDMT: Norvasc 10 mg daily, ASA 81 mg daily, neck close that 180-10 mg daily, Coreg 25 mg twice daily, Avapro 300 mg daily, a.m. spironolactone 12.5 mg daily -continue risk factor modification  Hypertension -low sodium diet, she does not use a lot of canned good -continue current medications and continue to track BP daily for me -Will arrange a nursing BP check in 2 weeks -limit stressors (she shares her ceiling is caving in and this has been causing her a lot  of stress)  Chronic diastolic CHF -no swelling in legs, euvolemic on exam -weight has been coming down (about 5 lbs since last month) -echo reviewed from 12/12, normal LVEF 60-65%, Grade 2DD, mild MR, mild AS -She will need one 12/24 -consider transition to Entresto at next appointment  HLD (statin intolerance) -Lipid panel at her primary appointment -last LDL 48 which is at goal (<70)  Carotid artery disease -06/2023 will be due for another [place] -no bruit on exam today -continue nexlizet and asa  Daytime somnolence/snoring -if she sits down for 10 minutes and she is asleep -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  Obesity with BMI of 32 -she continues to work full time cleaning houses -she had tried to increase physical activity and pay more attention to a low-sugar and low-sodium diet    Disposition: Follow up 3 months with [name], MD or APP.  Signed, Elgie Collard, PA-C 10/13/2022, 11:15 AM  [facility]

## 2022-10-13 ENCOUNTER — Ambulatory Visit: Payer: Medicare Other | Attending: Physician Assistant | Admitting: Physician Assistant

## 2022-10-13 ENCOUNTER — Inpatient Hospital Stay: Payer: Medicare Other | Attending: Adult Health | Admitting: Hematology and Oncology

## 2022-10-13 ENCOUNTER — Encounter: Payer: Self-pay | Admitting: Physician Assistant

## 2022-10-13 VITALS — BP 144/78 | HR 50 | Ht 60.0 in | Wt 163.6 lb

## 2022-10-13 DIAGNOSIS — I25119 Atherosclerotic heart disease of native coronary artery with unspecified angina pectoris: Secondary | ICD-10-CM

## 2022-10-13 DIAGNOSIS — R0683 Snoring: Secondary | ICD-10-CM | POA: Diagnosis not present

## 2022-10-13 DIAGNOSIS — E669 Obesity, unspecified: Secondary | ICD-10-CM | POA: Diagnosis not present

## 2022-10-13 DIAGNOSIS — R0602 Shortness of breath: Secondary | ICD-10-CM | POA: Diagnosis not present

## 2022-10-13 DIAGNOSIS — R400 Somnolence: Secondary | ICD-10-CM

## 2022-10-13 DIAGNOSIS — I5032 Chronic diastolic (congestive) heart failure: Secondary | ICD-10-CM

## 2022-10-13 DIAGNOSIS — R0609 Other forms of dyspnea: Secondary | ICD-10-CM

## 2022-10-13 NOTE — Patient Instructions (Signed)
Medication Instructions:  Your physician recommends that you continue on your current medications as directed. Please refer to the Current Medication list given to you today.  *If you need a refill on your cardiac medications before your next appointment, please call your pharmacy*   Lab Work: Have your primary care provider check a lipid panel when you see them If you have labs (blood work) drawn today and your tests are completely normal, you will receive your results only by: [place] (if you have MyChart) OR A paper copy in the mail If you have any lab test that is abnormal or we need to change your treatment, we will call you to review the results.   Follow-Up: At [facility], you and your health needs are our priority.  As part of our continuing mission to provide you with exceptional heart care, we have created designated Provider Care Teams.  These Care Teams include your primary Cardiologist (physician) and Advanced Practice Providers (APPs -  Physician Assistants and Nurse Practitioners) who all work together to provide you with the care you need, when you need it.   Your next appointment:  1.Schedule a nurse visit for 2-3 weeks for a blood pressure check. 2.Schedule a 3 month follow up appointment with Dr Johney Frame   Other Instructions 1.Check your blood pressure daily, one hour after taking your morning medications and keep a log to bring with you to your nurse visit.  Important Information About Sugar

## 2022-10-14 ENCOUNTER — Ambulatory Visit: Payer: Medicare Other | Admitting: Family Medicine

## 2022-10-15 ENCOUNTER — Telehealth: Payer: Self-pay | Admitting: Hematology and Oncology

## 2022-10-15 NOTE — Telephone Encounter (Signed)
Scheduled appointment per 12/20 staff message. Left voicemail.

## 2022-10-20 ENCOUNTER — Telehealth: Payer: Self-pay | Admitting: Family Medicine

## 2022-10-20 NOTE — Telephone Encounter (Signed)
Pt requesting refill of Effexor to [facility].  Pharmacy faxed request late 10/15/2022.

## 2022-10-21 ENCOUNTER — Other Ambulatory Visit: Payer: Self-pay

## 2022-10-21 ENCOUNTER — Ambulatory Visit: Payer: Medicare Other | Admitting: Nurse Practitioner

## 2022-10-21 MED ORDER — VENLAFAXINE HCL ER 75 MG PO CP24: ORAL_CAPSULE | ORAL | 2 refills | Status: DC

## 2022-10-21 NOTE — Telephone Encounter (Signed)
Rx filled

## 2022-10-27 ENCOUNTER — Ambulatory Visit: Payer: Medicare Other

## 2022-10-30 ENCOUNTER — Other Ambulatory Visit: Payer: Self-pay

## 2022-10-30 ENCOUNTER — Telehealth: Payer: Self-pay | Admitting: Cardiology

## 2022-10-30 MED ORDER — NEXLIZET 180-10 MG PO TABS: 180.0000 mg | ORAL_TABLET | Freq: Every day | ORAL | 3 refills | Status: DC

## 2022-10-30 NOTE — Telephone Encounter (Signed)
Renewed [employer] grant for another year.  Information given to patient

## 2022-10-30 NOTE — Telephone Encounter (Signed)
Patient stated she is looking to get a discount coupon/grant for her Bempedoic Acid-Ezetimibe (NEXLIZET) 180-10 MG TABS medication.

## 2022-11-04 ENCOUNTER — Other Ambulatory Visit: Payer: Self-pay | Admitting: Family Medicine

## 2022-11-06 ENCOUNTER — Other Ambulatory Visit: Payer: Self-pay

## 2022-11-06 ENCOUNTER — Inpatient Hospital Stay: Payer: Medicare Other | Attending: Adult Health | Admitting: Hematology and Oncology

## 2022-11-06 ENCOUNTER — Encounter: Payer: Self-pay | Admitting: Hematology and Oncology

## 2022-11-06 VITALS — BP 142/82 | HR 66 | Temp 97.9°F | Resp 16 | Ht 60.0 in | Wt 165.4 lb

## 2022-11-06 DIAGNOSIS — D0511 Intraductal carcinoma in situ of right breast: Secondary | ICD-10-CM

## 2022-11-06 DIAGNOSIS — Z79899 Other long term (current) drug therapy: Secondary | ICD-10-CM | POA: Diagnosis not present

## 2022-11-06 DIAGNOSIS — Z87891 Personal history of nicotine dependence: Secondary | ICD-10-CM | POA: Diagnosis not present

## 2022-11-06 DIAGNOSIS — Z79811 Long term (current) use of aromatase inhibitors: Secondary | ICD-10-CM | POA: Diagnosis not present

## 2022-11-06 NOTE — Progress Notes (Signed)
SURVIVORSHIP VISIT:    BRIEF ONCOLOGIC HISTORY:  Oncology History  Ductal carcinoma in situ (DCIS) of right breast  12/18/2021 Cancer Staging   Staging form: Breast, AJCC 8th Edition - Clinical stage from 12/18/2021: Stage 0 (cTis (DCIS), cN0, cM0, ER+, PR+, HER2: Not Assessed) - Signed by [name], MD on 01/07/2022 Stage prefix: Initial diagnosis Nuclear grade: G2 Laterality: Right Stage used in treatment planning: Yes National guidelines used in treatment planning: Yes Type of national guideline used in treatment planning: NCCN   01/05/2022 Initial Diagnosis   Ductal carcinoma in situ (DCIS) of right breast    Genetic Testing   [facility] CancerNext Panel was Negative. Report date is 01/17/2022.  The CancerNext gene panel offered by [employer] includes sequencing, rearrangement analysis, and RNA analysis for the following 36 genes:   APC, ATM, AXIN2, BARD1, BMPR1A, BRCA1, BRCA2, BRIP1, CDH1, CDK4, CDKN2A, CHEK2, DICER1, HOXB13, EPCAM, GREM1, MLH1, MSH2, MSH3, MSH6, MUTYH, NBN, NF1, NTHL1, PALB2, PMS2, POLD1, POLE, PTEN, RAD51C, RAD51D, RECQL, SMAD4, SMARCA4, STK11, and TP53.    01/22/2022 Surgery   She had right breast lumpectomy pathology showed invasive well-differentiated grade 1 ductal carcinoma, extensive intermediate grade DCIS.  Invasive tumor measured 2 mm in greatest dimension, margins free prognostics on the invasive tumor showed ER +85% moderate staining, progesterone receptor negative, KI of less than 5% and HER2 negative. Oncotype not needed since her tumor is less than 5 mm   01/2022 -  Anti-estrogen oral therapy   Anastrozole   03/04/2022 - 04/02/2022 Radiation Therapy   Site Technique Total Dose (Gy) Dose per Fx (Gy) Completed Fx Beam Energies  Breast, Right: Breast_R 3D 40.05/40.05 2.67 15/15 10X, 6XFFF  Breast, Right: Breast_R_Bst 3D 12/12 2 6/6 6X, 10X       INTERVAL HISTORY  Patient is here for follow-up on anastrozole.  She has been taking everything as  recommended.  She denies any new health complaints.  She has her mammogram scheduled.  She had a bone density recently.  She does not take any vitamin D supplementation.  She continues to stay active, cleans homes. Rest of the pertinent 10 point ROS reviewed and negative  REVIEW OF SYSTEMS:  Review of Systems  Constitutional:  Negative for appetite change, chills, fatigue, fever and unexpected weight change.  HENT:   Negative for hearing loss, lump/mass and trouble swallowing.   Eyes:  Negative for eye problems and icterus.  Respiratory:  Negative for chest tightness, cough and shortness of breath.   Cardiovascular:  Negative for chest pain, leg swelling and palpitations.  Gastrointestinal:  Negative for abdominal distention, abdominal pain, constipation, diarrhea, nausea and vomiting.  Endocrine: Negative for hot flashes.  Genitourinary:  Negative for difficulty urinating.   Musculoskeletal:  Negative for arthralgias.  Skin:  Negative for itching and rash.  Neurological:  Negative for dizziness, extremity weakness, headaches and numbness.  Hematological:  Negative for adenopathy. Does not bruise/bleed easily.  Psychiatric/Behavioral:  Negative for depression. The patient is not nervous/anxious.    Breast: Denies any new nodularity, masses, tenderness, nipple changes, or nipple discharge.   ONCOLOGY TREATMENT TEAM:  1. Surgeon:  Dr. Brantley Stage at [facility] 2. Medical Oncologist: Dr. Chryl Heck  3. Radiation Oncologist: Dr. Sondra Come    PAST MEDICAL/SURGICAL HISTORY:  Past Medical History:  Diagnosis Date   Adenomatous polyp 11/23/2005   Anxiety    01/09/22- patient denies anxiety   Asthma 2003   Basal cell carcinoma 2021   right cheek per patient at [facility] dermatology  Carotid stenosis    Chronic diastolic CHF (congestive heart failure) ([place]) 2017   Echo 2021 was normal   COPD (chronic obstructive pulmonary disease) ([place])    Coronary artery disease 2017   a. HC on  08/14/16 showed RCA CTO s/p PCI, 60% LCX disease managed medically with recs to consider staged PCI if continued symptoms.    Depression 2008   pt reports MD diagnosed but she does not feel depressed   Diabetes mellitus without complication ([place])    DIVERTICULOSIS, COLON 04/22/2007        GERD (gastroesophageal reflux disease) 2012   History of radiation therapy    Right breast- 03/04/22-04/02/22- Dr. Gery Pray   History of shingles    Hypertension 2008   Hypertensive heart disease    Insomnia 2003   Internal hemorrhoids    Osteoarthritis 2008   "knees, hands, lower back" (08/14/2016)   Pap smear for cervical cancer screening 2002   results normal   PUD (peptic ulcer disease) 1989   Seasonal allergies 2013   Sleep apnea    a. resolved with weight loss   Past Surgical History:  Procedure Laterality Date   BREAST BIOPSY Right 12/30/2021   BREAST EXCISIONAL BIOPSY Left    1980's x 3 - all benign   BREAST EXCISIONAL BIOPSY Right    1980's x 1 or 2 - all benign   BREAST LUMPECTOMY WITH RADIOACTIVE SEED LOCALIZATION Right 01/22/2022   Procedure: RIGHT BREAST LUMPECTOMY WITH RADIOACTIVE SEED LOCALIZATION;  Surgeon: Erroll Luna, MD;  Location: [facility];  Service: General;  Laterality: Right;   BREAST SURGERY Left 1980s   Fibrous Tumors removed    CARDIAC CATHETERIZATION N/A 08/14/2016   Procedure: Left Heart Cath and Coronary Angiography;  Surgeon: Sherren Mocha, MD;  Location: [facility];  Service: Cardiovascular;  Laterality: N/A;   CARDIAC CATHETERIZATION N/A 08/14/2016   Procedure: Coronary Stent Intervention;  Surgeon: Sherren Mocha, MD;  Location: [facility];  Service: Cardiovascular;  Laterality: N/A;   CATARACT EXTRACTION W/ INTRAOCULAR LENS  IMPLANT, BILATERAL Bilateral 05/2009   COLONOSCOPY N/A 2016   Cologuard 2019   CORONARY ANGIOPLASTY     detached retina left eye     may 2019   LEFT HEART CATH AND CORONARY ANGIOGRAPHY N/A  09/25/2022   Procedure: LEFT HEART CATH AND CORONARY ANGIOGRAPHY;  Surgeon: Burnell Blanks, MD;  Location: [facility];  Service: Cardiovascular;  Laterality: N/A;   TUBAL LIGATION  1970s     ALLERGIES:  Allergies  Allergen Reactions   Penicillins Anaphylaxis    REACTION: per patient causes rash,hives   Cephalosporins Swelling    REACTION: tongue swelling   Citalopram Hydrobromide Other (See Comments)    psychosis   Rosuvastatin Other (See Comments)    Pt reports causes bilateral lower extremity muscle aches.    Zolpidem Tartrate     REACTION: difficulty with concentration   Clarithromycin Rash    REACTION: blisters in mouth   Levofloxacin Rash    REACTION: tongue swells     CURRENT MEDICATIONS:  Outpatient Encounter Medications as of 11/06/2022  Medication Sig   acetaminophen (TYLENOL) 325 MG tablet Take 650 mg by mouth every 6 (six) hours as needed for mild pain, moderate pain or headache.   albuterol (VENTOLIN HFA) 108 (90 Base) MCG/ACT inhaler Inhale 2 puffs into the lungs every 6 (six) hours as needed for wheezing or shortness of breath.   ALPRAZolam (XANAX) 1 MG tablet  Take 1 tablet (1 mg total) by mouth at bedtime. DO NOT DRIVE FOR 8 HOURS AFTER TAKING   amLODipine (NORVASC) 10 MG tablet Take 1 tablet (10 mg total) by mouth daily.   anastrozole (ARIMIDEX) 1 MG tablet Take 1 tablet (1 mg total) by mouth daily.   aspirin EC 81 MG tablet Take 1 tablet (81 mg total) by mouth daily. Swallow whole.   Bempedoic Acid-Ezetimibe (NEXLIZET) 180-10 MG TABS Take 180 mg by mouth daily.   budesonide-formoterol (SYMBICORT) 160-4.5 MCG/ACT inhaler Inhale 2 puffs into the lungs 2 (two) times daily.   carvedilol (COREG) 25 MG tablet Take 1 tablet (25 mg total) by mouth 2 (two) times daily with a meal.   famotidine (PEPCID) 20 MG tablet TAKE ONE TABLET TWICE DAILY   irbesartan (AVAPRO) 300 MG tablet Take 1 tablet (300 mg total) by mouth daily.   levocetirizine (XYZAL) 5 MG  tablet Take 1 tablet (5 mg total) by mouth every morning.   omega-3 acid ethyl esters (LOVAZA) 1 g capsule TAKE TWO CAPSULES EVERY DAY   ondansetron (ZOFRAN-ODT) 8 MG disintegrating tablet Take 1 tablet (8 mg total) by mouth every 8 (eight) hours as needed for nausea or vomiting.   spironolactone (ALDACTONE) 25 MG tablet Take 0.5 tablets (12.5 mg total) by mouth daily.   venlafaxine XR (EFFEXOR-XR) 75 MG 24 hr capsule TAKE ONE CAPSULE EACH DAY WITH BREAKFAST   No facility-administered encounter medications on file as of 11/06/2022.     ONCOLOGIC FAMILY HISTORY:  Family History  Problem Relation Age of Onset   Heart disease Mother    Hypertension Mother    Diabetes Mother    Dementia Mother    COPD Father    Dementia Maternal Grandmother    Colon cancer Neg Hx      SOCIAL HISTORY:  Social History   Socioeconomic History   Marital status: Divorced    Spouse name: Not on file   Number of children: Not on file   Years of education: Not on file   Highest education level: Not on file  Occupational History   Occupation: Cleaning  Tobacco Use   Smoking status: Former    Packs/day: 1.00    Years: 26.00    Total pack years: 26.00    Types: Cigarettes    Quit date: 1989    Years since quitting: 35.0   Smokeless tobacco: Never  Vaping Use   Vaping Use: Never used  Substance and Sexual Activity   Alcohol use: No   Drug use: No   Sexual activity: Not Currently  Other Topics Concern   Not on file  Social History Narrative   Divorced for 12 years in 2016. 2 sons- 1 son had been in prison now living with her in 2019.  2 granddaughters with 1 living close.        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 Best friend [name] patient of Dr. Yong Channel. Enjoys work, time on Teaching laboratory technician.    Social Determinants of Health   Financial Resource Strain: Low  Risk  (01/07/2022)   Overall Financial Resource Strain (CARDIA)    Difficulty of Paying Living Expenses: Not very hard  Food Insecurity: No Food Insecurity (01/07/2022)   Hunger Vital Sign    Worried About Running Out of Food in the Last Year: Never true  Ran Out of Food in the Last Year: Never true  Transportation Needs: No Transportation Needs (01/07/2022)   PRAPARE - Hydrologist (Medical): No    Lack of Transportation (Non-Medical): No  Physical Activity: Sufficiently Active (09/29/2021)   Exercise Vital Sign    Days of Exercise per Week: 2 days    Minutes of Exercise per Session: 120 min  Stress: Stress Concern Present (09/29/2021)   [place]    Feeling of Stress : To some extent  Social Connections: Moderately Isolated (09/29/2021)   Social Connection and Isolation Panel [NHANES]    Frequency of Communication with Friends and Family: More than three times a week    Frequency of Social Gatherings with Friends and Family: More than three times a week    Attends Religious Services: More than 4 times per year    Active Member of [employer] or Organizations: No    Attends Archivist Meetings: Never    Marital Status: Divorced  Human resources officer Violence: Not At Risk (09/29/2021)   Humiliation, Afraid, Rape, and Kick questionnaire    Fear of Current or Ex-Partner: No    Emotionally Abused: No    Physically Abused: No    Sexually Abused: No     OBSERVATIONS/OBJECTIVE:  BP (!) 142/82 (BP Location: Left Arm, Patient Position: Sitting)   Pulse 66   Temp 97.9 F (36.6 C) (Temporal)   Resp 16   Ht 5' (1.524 m)   Wt 165 lb 6.4 oz (75 kg)   SpO2 99%   BMI 32.30 kg/m   GENERAL: Patient is a well appearing female in no acute distress Skin: Scattered ecchymosis on forearms Breast: Bilateral breasts inspected.  Near the right breast lumpectomy scar there is a small seroma.  No other  palpable masses.  No regional adenopathy.  Left breast normal to inspection and palpation.   LABORATORY DATA:  None for this visit.  DIAGNOSTIC IMAGING:  None for this visit.      ASSESSMENT AND PLAN:  Ms.. Peyton is a pleasant 77 y.o. female with Stage 0 right breast DCIS, ER+/PR+, diagnosed in February 2023, treated with lumpectomy, adjuvant radiation therapy, and anti-estrogen therapy with anastrozole beginning in October 2023.  Since last visit she had a bone density scan which showed osteopenia with a T-score of -2.4, very close to being osteoporotic. She denies any new health complaints today. Physical examination today without any concerns for recurrence. Continue anastrozole as recommended, she has been tolerating it extremely well. With regards to osteoporosis and osteopenia, we have discussed about considering bone strengthening exercises such as weight lifting, vitamin D supplementation and calcium as tolerated.  She understands that there are some medications that can also be used but she would like to wait on these. Return to clinic in 6 months or sooner as needed.   Total encounter time:30 minutes*in face-to-face visit time, chart review, lab review, care coordination, order entry, and documentation of the encounter time.  *Total Encounter Time as defined by the Centers for Medicare and Medicaid Services includes, in addition to the face-to-face time of a patient visit (documented in the note above) non-face-to-face time: obtaining and reviewing outside history, ordering and reviewing medications, tests or procedures, care coordination (communications with other health care professionals or caregivers) and documentation in the medical record.

## 2022-11-12 ENCOUNTER — Encounter: Payer: Self-pay | Admitting: Family Medicine

## 2022-11-12 ENCOUNTER — Ambulatory Visit (INDEPENDENT_AMBULATORY_CARE_PROVIDER_SITE_OTHER): Payer: Medicare Other | Admitting: Family Medicine

## 2022-11-12 VITALS — BP 132/78 | HR 71 | Temp 97.6°F | Ht 60.0 in | Wt 163.0 lb

## 2022-11-12 DIAGNOSIS — Z23 Encounter for immunization: Secondary | ICD-10-CM | POA: Diagnosis not present

## 2022-11-12 DIAGNOSIS — I5032 Chronic diastolic (congestive) heart failure: Secondary | ICD-10-CM | POA: Diagnosis not present

## 2022-11-12 DIAGNOSIS — E559 Vitamin D deficiency, unspecified: Secondary | ICD-10-CM

## 2022-11-12 DIAGNOSIS — Z0001 Encounter for general adult medical examination with abnormal findings: Secondary | ICD-10-CM | POA: Diagnosis not present

## 2022-11-12 DIAGNOSIS — D692 Other nonthrombocytopenic purpura: Secondary | ICD-10-CM

## 2022-11-12 DIAGNOSIS — E118 Type 2 diabetes mellitus with unspecified complications: Secondary | ICD-10-CM | POA: Diagnosis not present

## 2022-11-12 DIAGNOSIS — Z1283 Encounter for screening for malignant neoplasm of skin: Secondary | ICD-10-CM

## 2022-11-12 DIAGNOSIS — G720 Drug-induced myopathy: Secondary | ICD-10-CM

## 2022-11-12 NOTE — Progress Notes (Deleted)
[place] Phone: [phone] Subjective:    I'm seeing this patient by the request  of:  Marin Olp, MD  CC:   [id number]  Felicia Perry is a 77 y.o. female coming in with complaint of back and neck pain. OTM 07/03/2022 and B knee pain f/u. Patient states   Medications patient has been prescribed: Effexor  Taking:         Reviewed prior external information including notes and imaging from previsou exam, outside providers and external EMR if available.   As well as notes that were available from care everywhere and other healthcare systems.  Past medical history, social, surgical and family history all reviewed in electronic medical record.  No pertanent information unless stated regarding to the chief complaint.   Past Medical History:  Diagnosis Date   Adenomatous polyp 11/23/2005   Anxiety    01/09/22- patient denies anxiety   Asthma 2003   Basal cell carcinoma 2021   right cheek per patient at [place] [facility]   Carotid stenosis    Chronic diastolic CHF (congestive heart failure) ([place]) 2017   Echo 2021 was normal   COPD (chronic obstructive pulmonary disease) ([place])    Coronary artery disease 2017   a. HC on 08/14/16 showed RCA CTO s/p PCI, 60% LCX disease managed medically with recs to consider staged PCI if continued symptoms.    Depression 2008   pt reports MD diagnosed but she does not feel depressed   Diabetes mellitus without complication ([place])    DIVERTICULOSIS, COLON 04/22/2007        GERD (gastroesophageal reflux disease) 2012   History of radiation therapy    Right breast- 03/04/22-04/02/22- Dr. Gery Pray   History of shingles    Hypertension 2008   Hypertensive heart disease    Insomnia 2003   Internal hemorrhoids    Osteoarthritis 2008   "knees, hands, lower back" (08/14/2016)   Pap smear for cervical cancer screening 2002   results normal   PUD (peptic  ulcer disease) 1989   Seasonal allergies 2013   Sleep apnea    a. resolved with weight loss    Allergies  Allergen Reactions   Penicillins Anaphylaxis    REACTION: per patient causes rash,hives   Cephalosporins Swelling    REACTION: tongue swelling   Citalopram Hydrobromide Other (See Comments)    psychosis   Rosuvastatin Other (See Comments)    Pt reports causes bilateral lower extremity muscle aches.    Zolpidem Tartrate     REACTION: difficulty with concentration   Clarithromycin Rash    REACTION: blisters in mouth   Levofloxacin Rash    REACTION: tongue swells     Review of Systems:  No headache, visual changes, nausea, vomiting, diarrhea, constipation, dizziness, abdominal pain, skin rash, fevers, chills, night sweats, weight loss, swollen lymph nodes, body aches, joint swelling, chest pain, shortness of breath, mood changes. POSITIVE muscle aches  Objective  There were no vitals taken for this visit.   General: No apparent distress alert and oriented x3 mood and affect normal, dressed appropriately.  HEENT: Pupils equal, extraocular movements intact  Respiratory: Patient's speak in full sentences and does not appear short of breath  Cardiovascular: No lower extremity edema, non tender, no erythema  Gait MSK:  Back   Osteopathic findings  C2 flexed rotated and side bent right C6 flexed rotated and side bent left T3 extended rotated and side bent  right inhaled rib T9 extended rotated and side bent left L2 flexed rotated and side bent right Sacrum right on right       Assessment and Plan:  No problem-specific Assessment & Plan notes found for this encounter.    Nonallopathic problems  Decision today to treat with OMT was based on Physical Exam  After verbal consent patient was treated with HVLA, ME, FPR techniques in cervical, rib, thoracic, lumbar, and sacral  areas  Patient tolerated the procedure well with improvement in symptoms  Patient given  exercises, stretches and lifestyle modifications  See medications in patient instructions if given  Patient will follow up in 4-8 weeks             Note: This dictation was prepared with Dragon dictation along with smaller phrase technology. Any transcriptional errors that result from this process are unintentional.

## 2022-11-12 NOTE — Progress Notes (Signed)
Phone [phone]   Subjective:  Patient presents today for their annual physical. Chief complaint-noted.   See problem oriented charting- ROS- full  review of systems was completed and negative except for: cough, shortness of breath, wheezing at times with asthma but generally does well, joint pain- knees and multiple joints, vertigo, easy bruising  The following were reviewed and entered/updated in epic: Past Medical History:  Diagnosis Date   Adenomatous polyp 11/23/2005   Anxiety    01/09/22- patient denies anxiety   Asthma 2003   Basal cell carcinoma 2021   right cheek per patient at [place] [facility]   Carotid stenosis    Chronic diastolic CHF (congestive heart failure) ([place]) 2017   Echo 2021 was normal   COPD (chronic obstructive pulmonary disease) ([place])    Coronary artery disease 2017   a. HC on 08/14/16 showed RCA CTO s/p PCI, 60% LCX disease managed medically with recs to consider staged PCI if continued symptoms.    Depression 2008   pt reports MD diagnosed but she does not feel depressed   Diabetes mellitus without complication ([place])    DIVERTICULOSIS, COLON 04/22/2007        GERD (gastroesophageal reflux disease) 2012   History of radiation therapy    Right breast- 03/04/22-04/02/22- Dr. Gery Pray   History of shingles    Hypertension 2008   Hypertensive heart disease    Insomnia 2003   Internal hemorrhoids    Osteoarthritis 2008   "knees, hands, lower back" (08/14/2016)   Pap smear for cervical cancer screening 2002   results normal   PUD (peptic ulcer disease) 1989   Seasonal allergies 2013   Sleep apnea    a. resolved with weight loss   Patient Active Problem List   Diagnosis Date Noted   Ductal carcinoma in situ (DCIS) of right breast 01/05/2022    Priority: High   Diabetes mellitus type 2 with complications ([place]) 38/75/6433    Priority: High   Carotid stenosis 01/23/2020    Priority: High   Chronic diastolic CHF (congestive heart failure)  ([place]) 01/12/2019    Priority: High   Coronary artery disease involving native coronary artery of native heart without angina pectoris     Priority: High   Syncope 08/04/2016    Priority: High   statin myalgia 06/25/2020    Priority: Medium    Hyperlipidemia, unspecified 07/11/2018    Priority: Medium    H/O hyperparathyroidism 08/18/2016    Priority: Medium    Vitamin D deficiency 08/20/2015    Priority: Medium    Chronic pain syndrome 03/26/2015    Priority: Medium    Insomnia 07/13/2014    Priority: Medium    GERD (gastroesophageal reflux disease) 02/25/2011    Priority: Medium    Depression 04/22/2007    Priority: Medium    Essential hypertension 04/22/2007    Priority: Medium    Asthma 04/22/2007    Priority: Medium    Greater trochanteric bursitis of left hip 08/13/2020    Priority: Low   Senile purpura ([place]) 08/07/2020    Priority: Low   Degenerative arthritis of knee, bilateral 12/08/2018    Priority: Low   Partial hamstring tear, initial encounter 11/16/2018    Priority: Low   Nonallopathic lesion of sacral region 07/05/2018    Priority: Low   Nonallopathic lesion of cervical region 07/05/2018    Priority: Low   DDD (degenerative disc disease), cervical 05/04/2018    Priority: Low   Degenerative arthritis of left  knee 09/15/2017    Priority: Low   PUD (peptic ulcer disease)     Priority: Low   Hyperglycemia 08/06/2015    Priority: Low   Xerosis of skin 08/06/2015    Priority: Low   Hypersomnia with sleep apnea 03/26/2015    Priority: Low   Nonallopathic lesion of lumbosacral region 11/13/2014    Priority: Low   Osteoarthritis of left lower extremity 10/17/2014    Priority: Low   Chronic meniscal tear of knee 10/17/2014    Priority: Low   Nonallopathic lesion of thoracic region 09/14/2014    Priority: Low   Nonallopathic lesion-rib cage 08/21/2014    Priority: Low   Tendinopathy of right rotator cuff 07/30/2014    Priority: Low   Piriformis  syndrome of right side 07/30/2014    Priority: Low   Limited joint range of motion 07/13/2014    Priority: Low   Rash 07/13/2014    Priority: Low   SI (sacroiliac) joint dysfunction 10/10/2007    Priority: Low   LOW BACK PAIN 07/01/2007    Priority: Low   Shortness of breath 09/25/2022   Genetic testing 01/19/2022   Intractable headache 01/02/2021   Past Surgical History:  Procedure Laterality Date   BREAST BIOPSY Right 12/30/2021   BREAST EXCISIONAL BIOPSY Left    1980's x 3 - all benign   BREAST EXCISIONAL BIOPSY Right    1980's x 1 or 2 - all benign   BREAST LUMPECTOMY WITH RADIOACTIVE SEED LOCALIZATION Right 01/22/2022   Procedure: RIGHT BREAST LUMPECTOMY WITH RADIOACTIVE SEED LOCALIZATION;  Surgeon: Erroll Luna, MD;  Location: [facility];  Service: General;  Laterality: Right;   BREAST SURGERY Left 1980s   Fibrous Tumors removed    CARDIAC CATHETERIZATION N/A 08/14/2016   Procedure: Left Heart Cath and Coronary Angiography;  Surgeon: Sherren Mocha, MD;  Location: [facility];  Service: Cardiovascular;  Laterality: N/A;   CARDIAC CATHETERIZATION N/A 08/14/2016   Procedure: Coronary Stent Intervention;  Surgeon: Sherren Mocha, MD;  Location: [facility];  Service: Cardiovascular;  Laterality: N/A;   CATARACT EXTRACTION W/ INTRAOCULAR LENS  IMPLANT, BILATERAL Bilateral 05/2009   COLONOSCOPY N/A 2016   Cologuard 2019   CORONARY ANGIOPLASTY     detached retina left eye     may 2019   LEFT HEART CATH AND CORONARY ANGIOGRAPHY N/A 09/25/2022   Procedure: LEFT HEART CATH AND CORONARY ANGIOGRAPHY;  Surgeon: Burnell Blanks, MD;  Location: [facility];  Service: Cardiovascular;  Laterality: N/A;   TUBAL LIGATION  1970s    Family History  Problem Relation Age of Onset   Heart disease Mother    Hypertension Mother    Diabetes Mother    Dementia Mother    COPD Father    Dementia Maternal Grandmother    Colon cancer Neg Hx      Medications- reviewed and updated Current Outpatient Medications  Medication Sig Dispense Refill   acetaminophen (TYLENOL) 325 MG tablet Take 650 mg by mouth every 6 (six) hours as needed for mild pain, moderate pain or headache.     albuterol (VENTOLIN HFA) 108 (90 Base) MCG/ACT inhaler Inhale 2 puffs into the lungs every 6 (six) hours as needed for wheezing or shortness of breath. 18 g 5   ALPRAZolam (XANAX) 1 MG tablet Take 1 tablet (1 mg total) by mouth at bedtime. DO NOT DRIVE FOR 8 HOURS AFTER TAKING 90 tablet 1   amLODipine (NORVASC) 10 MG tablet  Take 1 tablet (10 mg total) by mouth daily. 90 tablet 2   anastrozole (ARIMIDEX) 1 MG tablet Take 1 tablet (1 mg total) by mouth daily. 30 tablet 3   aspirin EC 81 MG tablet Take 1 tablet (81 mg total) by mouth daily. Swallow whole. 90 tablet 3   Bempedoic Acid-Ezetimibe (NEXLIZET) 180-10 MG TABS Take 180 mg by mouth daily. 90 tablet 3   budesonide-formoterol (SYMBICORT) 160-4.5 MCG/ACT inhaler Inhale 2 puffs into the lungs 2 (two) times daily. 1 each 11   carvedilol (COREG) 25 MG tablet Take 1 tablet (25 mg total) by mouth 2 (two) times daily with a meal. 180 tablet 3   famotidine (PEPCID) 20 MG tablet TAKE ONE TABLET TWICE DAILY 60 tablet 5   irbesartan (AVAPRO) 300 MG tablet Take 1 tablet (300 mg total) by mouth daily. 90 tablet 2   levocetirizine (XYZAL) 5 MG tablet Take 1 tablet (5 mg total) by mouth every morning. 90 tablet 3   omega-3 acid ethyl esters (LOVAZA) 1 g capsule TAKE TWO CAPSULES EVERY DAY 180 capsule 3   ondansetron (ZOFRAN-ODT) 8 MG disintegrating tablet Take 1 tablet (8 mg total) by mouth every 8 (eight) hours as needed for nausea or vomiting. 20 tablet 0   spironolactone (ALDACTONE) 25 MG tablet Take 0.5 tablets (12.5 mg total) by mouth daily. 45 tablet 3   venlafaxine XR (EFFEXOR-XR) 75 MG 24 hr capsule TAKE ONE CAPSULE EACH DAY WITH BREAKFAST 90 capsule 2   No current facility-administered medications for this visit.     Allergies-reviewed and updated Allergies  Allergen Reactions   Penicillins Anaphylaxis    REACTION: per patient causes rash,hives   Cephalosporins Swelling    REACTION: tongue swelling   Citalopram Hydrobromide Other (See Comments)    psychosis   Rosuvastatin Other (See Comments)    Pt reports causes bilateral lower extremity muscle aches.    Zolpidem Tartrate     REACTION: difficulty with concentration   Clarithromycin Rash    REACTION: blisters in mouth   Levofloxacin Rash    REACTION: tongue swells    Social History   Social History Narrative   Divorced for 61 years in 2016. 2 sons- 1 son had been in prison now living with her in 2019.  2 granddaughters with 1 living close.        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 Best friend [name] patient of Dr. Yong Channel. Enjoys work, time on Teaching laboratory technician.    Objective  Objective:  BP 132/78   Pulse 71   Temp 97.6 F (36.4 C)   Ht 5' (1.524 m)   Wt 163 lb (73.9 kg)   SpO2 98%   BMI 31.83 kg/m  Gen: NAD, resting comfortably HEENT: Mucous membranes are moist. Oropharynx normal Neck: no thyromegaly CV: RRR systolic murmur= known mild aortic stenosis Lungs: CTAB no crackles, wheeze, rhonchi Abdomen: soft/nontender/nondistended/normal bowel sounds. No rebound or guarding.  Ext: no edema Skin: warm, dry Neuro: CN II-XII intact, sensation and reflexes normal throughout, 5/5 muscle strength in bilateral upper and lower extremities. Normal finger to nose. Normal rapid alternating movements. No pronator drift. Normal romberg. Normal gait.  -when we laid patient down and sat her up- had vertigo with nystagmus noted  Diabetic Foot Exam - Simple   Simple Foot Form Diabetic Foot exam  was performed with the following findings: Yes 11/12/2022  3:55 PM  Visual Inspection No deformities, no  ulcerations, no other skin breakdown bilaterally: Yes Sensation Testing Intact to touch and monofilament testing bilaterally: Yes Pulse Check Posterior Tibialis and Dorsalis pulse intact bilaterally: Yes Comments       Assessment and Plan   77 y.o. female presenting for annual physical.  Health Maintenance counseling: 1. Anticipatory guidance: Patient counseled regarding regular dental exams -q6 months, eye exams - yearly,  avoiding smoking and second hand smoke- son not smoking anymore- brueggers , limiting alcohol to 1 beverage per day- 1 bottle of wine per year, no illicit drugs .   2. Risk factor reduction:  Advised patient of need for regular exercise and diet rich and fruits and vegetables to reduce risk of heart attack and stroke.  Exercise- active with work still- still riding her stationary bike.  Diet/weight management-Down 12 pounds since last physical.  Wt Readings from Last 3 Encounters:  11/12/22 163 lb (73.9 kg)  11/06/22 165 lb 6.4 oz (75 kg)  10/13/22 163 lb 9.6 oz (74.2 kg)  3. Immunizations/screenings/ancillary studies-declines COVID shot for now- may consider at pharmacy, flu shot today , recommended RSV vaccine at pharmacy Immunization History  Administered Date(s) Administered   Fluad Quad(high Dose 65+) 07/25/2019, 08/07/2020, 08/12/2021, 11/12/2022   Influenza Split 09/21/2011, 07/07/2012   Influenza Whole 08/10/2008, 07/30/2010   Influenza, High Dose Seasonal PF 08/04/2016, 07/05/2018   Influenza,inj,Quad PF,6+ Mos 07/21/2013, 08/06/2015   Influenza-Unspecified 07/10/2014, 10/01/2017   PFIZER(Purple Top)SARS-COV-2 Vaccination 12/26/2019, 01/17/2020   Pneumococcal Conjugate-13 01/05/2014   Pneumococcal Polysaccharide-23 02/08/2015   Td 10/26/2001   Tetanus 01/05/2014  4. Cervical cancer screening- past age based screening recommendations-denies history of abnormal Pap smears. No blood or discharge from vagina.   5. Breast cancer screening- with oncology and  has scheduled mammogram in february 6. Colon cancer screening -Cologuard 01/15/2022 with no further repeat planned  7. Skin cancer screening-sees dermatology- but office closed.  Advised regular sunscreen use. Denies worrisome, changing, or new skin lesions.  8. Birth control/STD check- postmenopausal/dating but not sexually active.  Would use condoms if active- no unprotected sex   16. Osteoporosis screening at 65-last done in 2023-very close to osteoporosis range-oncology has already discussed with her calcium, vitamin D, weightbearing exercises 10. Smoking associated screening - former smoker - 26 pack years quit in 1980s.  No regular screening required  Status of chronic or acute concerns   #social update- going back to school for theology  # Fall with head trauma S: Patient had a fall on Sunday night hit the back of her head.  Since that time she has had a foggy feeling/lack of mental clarity.  Has had vertigo symptoms at times as well with position change particularly getting in and out of bed but doing much better -she was going down the stairs backwards putting laundry basket on each step then with 3-4 steps to go slipped and fell backwards onto her backside and hit her head on metal shoe rack -no blood thinner other than aspirin -no headache - still able to work without issues A/P: fall with head trauma on aspirin. Offered head CT- with her improving she prefers to hold off. I think she at least had a concussion as well as BPPV but symptoms improving. She will follow up immediately with new or worsening symptoms.     #Right breast DCIS on biopsy 12/31/2021-she has had a lumpectomy and has completed adjuvant radiation on  04/02/2022.  She is taking anastrozole as well and plans for 54-monthfollow-up- recently had good report in December with next visit in 6 months  -Follows with Dr. IChryl Heck # Diabetes-diagnosed 08/07/2020 after having second A1c 6.5 or above S: Medication: None, currently diet  controlled Lab Results  Component Value Date   HGBA1C 6.4 04/08/2022   HGBA1C 7.0 (H) 12/09/2021   HGBA1C 7.5 (H) 08/12/2021  A/P: controlled last visit- update a1c with labs  #hypertension #Chronic diastolic CHF S: medication: Amlodipine 10 mg, irbesartan 300 mg, coreg 25 mg BID spironolactone 25 mg -Has required Lasix in the past for fluid overload, cautious with HCTZ due to hercalcemia history- none needed lately BP Readings from Last 3 Encounters:  11/12/22 132/78  11/06/22 (!) 142/82  10/13/22 (!) 144/78  A/P: blood pressure looks good on repeat/improved at least- we opted to continue current meds. Home readings 130s/70s per her report CHF- weight trending down and no increased swelling   #CAD  #hyperlipidemia with statin myalgia #Carotid stenosis-checked September each year by cardiology S: Medication: nexlizet (bempedoic acid-ezetimibe), Lovaza, aspirin 81 mg -History of syncope related to ischemia -Still struggles with elevated triglycerides on this regimen Lab Results  Component Value Date   CHOL 116 12/09/2021   HDL 32.90 (L) 12/09/2021   LDLCALC 48 08/07/2020   LDLDIRECT 53.0 12/09/2021   TRIG 217.0 (H) 12/09/2021   CHOLHDL 4 12/09/2021   A/P: CAD and carotid stenosis- presumed stable. CAD and carotid stenosisAsymptomatic (doubt dizziness from carotid stenosis) Lipids- reasonable at goal- though could push triglycerides now  # Asthma/allergies S: Maintenance Medication:  hasn't used lately- perhaps a yearSymbicort 160-4.52 puffs.  For allergies-on Xyzal As needed medication: albuterol. Patient is using this once a month.  A/P: overall stable x2- continue current medications    #Depression-medication also helps with chronic pain/insomnia S: Medication: Effexor 75 mg extended release alprazolam 1 mg as needed before bed    11/12/2022    3:11 PM 09/29/2021    8:56 AM 08/12/2021   10:44 AM  Depression screen PHQ 2/9  Decreased Interest 0 0 0  Down, Depressed,  Hopeless 1 0 0  PHQ - 2 Score 1 0 0  Altered sleeping 0    Tired, decreased energy 0    Change in appetite 0    Feeling bad or failure about yourself  0    Trouble concentrating 0    Moving slowly or fidgety/restless 0    Suicidal thoughts 0    PHQ-9 Score 1    Difficult doing work/chores Not difficult at all    A/P: reasonable control- continue current medications   #GERD S: Medication: Pepcid 20 mg twice daily A/P: doing well on this as needed- not needing much     #Vitamin D deficiency S: Medication: 1000 units recommended Last vitamin D Lab Results  Component Value Date   VD25OH 45.47 08/12/2021  A/P: ok on last check- not currently vit D- update today   #Chronic pain/multiple orthopedic issues-follows closely with Dr. ZCharlann Boxer has been doing better lately  #senile purpura- noted. Stable. Check cbc at least annually  Recommended follow up: Return in about 6 months (around 05/13/2023) for followup or sooner if needed.Schedule b4 you leave. Future Appointments  Date Time Provider DRiggins 11/18/2022  8:15 AM SLyndal Pulley DO [facility] None  11/18/2022  8:40 AM [facility] DIAG TOMO 1 [facility]  12/02/2022  2:30 PM [facility] PHARMACIST [facility]  01/15/2023  8:20 AM  [name], MD [facility]  02/05/2023 12:00 PM Edythe Clarity, RPH [facility] None  05/07/2023  9:30 AM [name], MD [facility] None  06/30/2023  1:30 PM [facility] NL VASC 3 [facility]SECVI [facility]  10/08/2023  9:35 AM [facility] ECHO 1 [facility]   Lab/Order associations:NOT fasting- boost this AM but otherwise fasting   ICD-10-CM   1. Encounter for general adult medical examination with abnormal findings  Z00.01     2. Need for immunization against influenza  Z23 Flu Vaccine QUAD High Dose(Fluad)    3. Diabetes mellitus type 2 with complications (HCC)  B28.4     4. Chronic diastolic CHF (congestive heart failure) (HCC)  I50.32     5.  Vitamin D deficiency  E55.9     6. Drug-induced myopathy  G72.0       No orders of the defined types were placed in this encounter.   Return precautions advised.  Garret Reddish, MD

## 2022-11-12 NOTE — Patient Instructions (Addendum)
Let us know when you have gotten your [facility] and COVID vaccines at the pharmacy. -recommend RSV at pharmacy  fall with head trauma on aspirin. Offered head CT- with her improving she prefers to hold off. I think she at least had a concussion as well as BPPV but symptoms improving. She will follow up immediately with new or worsening symptoms.    Please stop by lab before you go If you have mychart- we will send your results within 3 business days of [place] receiving them.  If you do not have mychart- we will call you about results within 5 business days of [place] receiving them.  *please also note that you will see labs on mychart as soon as they post. I will later go in and write notes on them- will say "notes from Dr. Yong Channel"   We will call you within two weeks about your referral to dermatology. If you do not hear within 2 weeks, give [place] a call.   Richrd Sox, MD [facility]igo [facility] Oct 11 [email or web address]   Recommended follow up: Return in about 6 months (around 05/13/2023) for followup or sooner if needed.Schedule b4 you leave.

## 2022-11-13 LAB — COMPREHENSIVE METABOLIC PANEL
ALT: 20 U/L (ref 0–35)
AST: 22 U/L (ref 0–37)
Albumin: 4.7 g/dL (ref 3.5–5.2)
Alkaline Phosphatase: 59 U/L (ref 39–117)
BUN: 25 mg/dL — ABNORMAL HIGH (ref 6–23)
CO2: 27 mEq/L (ref 19–32)
Calcium: 10 mg/dL (ref 8.4–10.5)
Chloride: 101 mEq/L (ref 96–112)
Creatinine, Ser: 1.03 mg/dL (ref 0.40–1.20)
GFR: 52.86 mL/min — ABNORMAL LOW (ref >60.00)
Glucose, Bld: 108 mg/dL — ABNORMAL HIGH (ref 70–99)
Potassium: 4.1 mEq/L (ref 3.5–5.1)
Sodium: 142 mEq/L (ref 135–145)
Total Bilirubin: 0.6 mg/dL (ref 0.2–1.2)
Total Protein: 7.7 g/dL (ref 6.0–8.3)

## 2022-11-13 LAB — MICROALBUMIN / CREATININE URINE RATIO
Creatinine,U: 132.2 mg/dL
Microalb Creat Ratio: 1.4 mg/g (ref 0.0–30.0)
Microalb, Ur: 1.9 mg/dL (ref 0.0–1.9)

## 2022-11-13 LAB — CBC WITH DIFFERENTIAL/PLATELET
Basophils Absolute: 0.1 10*3/uL (ref 0.0–0.1)
Basophils Relative: 0.9 % (ref 0.0–3.0)
Eosinophils Absolute: 0.3 10*3/uL (ref 0.0–0.7)
Eosinophils Relative: 2.7 % (ref 0.0–5.0)
HCT: 42 % (ref 36.0–46.0)
Hemoglobin: 14.2 g/dL (ref 12.0–15.0)
Lymphocytes Relative: 17.8 % (ref 12.0–46.0)
Lymphs Abs: 1.9 10*3/uL (ref 0.7–4.0)
MCHC: 33.8 g/dL (ref 30.0–36.0)
MCV: 87.9 fl (ref 78.0–100.0)
Monocytes Absolute: 1 10*3/uL (ref 0.1–1.0)
Monocytes Relative: 9.6 % (ref 3.0–12.0)
Neutro Abs: 7.2 10*3/uL (ref 1.4–7.7)
Neutrophils Relative %: 69 % (ref 43.0–77.0)
Platelets: 334 10*3/uL (ref 150.0–400.0)
RBC: 4.78 Mil/uL (ref 3.87–5.11)
RDW: 12.5 % (ref 11.5–15.5)
WBC: 10.4 10*3/uL (ref 4.0–10.5)

## 2022-11-13 LAB — LDL CHOLESTEROL, DIRECT: Direct LDL: 58 mg/dL

## 2022-11-13 LAB — VITAMIN D 25 HYDROXY (VIT D DEFICIENCY, FRACTURES): VITD: 29.03 ng/mL — ABNORMAL LOW (ref 30.00–100.00)

## 2022-11-13 LAB — LIPID PANEL
Cholesterol: 120 mg/dL (ref 0–200)
HDL: 32.3 mg/dL — ABNORMAL LOW (ref >39.00)
NonHDL: 87.72
Total CHOL/HDL Ratio: 4
Triglycerides: 294 mg/dL — ABNORMAL HIGH (ref 0.0–149.0)
VLDL: 58.8 mg/dL — ABNORMAL HIGH (ref 0.0–40.0)

## 2022-11-13 LAB — HEMOGLOBIN A1C: Hgb A1c MFr Bld: 6.8 % — ABNORMAL HIGH (ref 4.6–6.5)

## 2022-11-16 ENCOUNTER — Telehealth: Payer: Self-pay | Admitting: Family Medicine

## 2022-11-16 NOTE — Telephone Encounter (Signed)
Copied from [place] [phone]. Topic: Medicare AWV >> Nov 16, 2022 11:00 AM Gillis Santa wrote: Reason for CRM: LVM PATIENT TO CALL [phone] [place]

## 2022-11-18 ENCOUNTER — Ambulatory Visit: Payer: Medicare Other | Admitting: Family Medicine

## 2022-11-18 ENCOUNTER — Ambulatory Visit
Admission: RE | Admit: 2022-11-18 | Discharge: 2022-11-18 | Disposition: A | Discharge status: Home / Self Care | Payer: Medicare Other | Source: Ambulatory Visit | Attending: Adult Health | Admitting: Adult Health

## 2022-11-18 DIAGNOSIS — Z853 Personal history of malignant neoplasm of breast: Secondary | ICD-10-CM | POA: Diagnosis not present

## 2022-11-18 DIAGNOSIS — R928 Other abnormal and inconclusive findings on diagnostic imaging of breast: Secondary | ICD-10-CM | POA: Diagnosis not present

## 2022-11-18 DIAGNOSIS — D0511 Intraductal carcinoma in situ of right breast: Secondary | ICD-10-CM

## 2022-11-18 HISTORY — DX: Personal history of irradiation: Z92.3

## 2022-11-19 NOTE — Progress Notes (Deleted)
[place] Phone: [phone] Subjective:    I'm seeing this patient by the request  of:  Marin Olp, MD  CC:   [id number]  07/03/2022 Chronic problem with exacerbation likely secondary to patient's compensating for her knee somewhat. Has responded well to osteopathic manipulation previously and hopefully will respond again well. Follow-up again in 6 to 8 weeks.   Bilateral injections given today and tolerated the procedure well, discussed icing regimen and home exercises.  Increase activity slowly.  Follow-up again in 10 weeks.  Patient wants to avoid any type of surgical intervention at this time     Updated 11/23/2022 Felicia Perry is a 77 y.o. female coming in with complaint of bilateral knee and back pain  Onset-  Location Duration-  Character- Aggravating factors- Reliving factors-  Therapies tried-  Severity-     Past Medical History:  Diagnosis Date   Adenomatous polyp 11/23/2005   Anxiety    01/09/22- patient denies anxiety   Asthma 2003   Basal cell carcinoma 2021   right cheek per patient at [place] [facility]   Carotid stenosis    Chronic diastolic CHF (congestive heart failure) ([place]) 2017   Echo 2021 was normal   COPD (chronic obstructive pulmonary disease) ([place])    Coronary artery disease 2017   a. HC on 08/14/16 showed RCA CTO s/p PCI, 60% LCX disease managed medically with recs to consider staged PCI if continued symptoms.    Depression 2008   pt reports MD diagnosed but she does not feel depressed   Diabetes mellitus without complication ([place])    DIVERTICULOSIS, COLON 04/22/2007        GERD (gastroesophageal reflux disease) 2012   History of radiation therapy    Right breast- 03/04/22-04/02/22- Dr. Gery Pray   History of shingles    Hypertension 2008   Hypertensive heart disease    Insomnia 2003   Internal hemorrhoids    Osteoarthritis 2008   "knees, hands,  lower back" (08/14/2016)   Pap smear for cervical cancer screening 2002   results normal   Personal history of radiation therapy    PUD (peptic ulcer disease) 1989   Seasonal allergies 2013   Sleep apnea    a. resolved with weight loss   Past Surgical History:  Procedure Laterality Date   BREAST BIOPSY Right 12/30/2021   BREAST EXCISIONAL BIOPSY Left    1980's x 3 - all benign   BREAST EXCISIONAL BIOPSY Right    1980's x 1 or 2 - all benign   BREAST LUMPECTOMY Right 01/22/2022   BREAST LUMPECTOMY WITH RADIOACTIVE SEED LOCALIZATION Right 01/22/2022   Procedure: RIGHT BREAST LUMPECTOMY WITH RADIOACTIVE SEED LOCALIZATION;  Surgeon: Erroll Luna, MD;  Location: [facility];  Service: General;  Laterality: Right;   BREAST SURGERY Left 1980s   Fibrous Tumors removed    CARDIAC CATHETERIZATION N/A 08/14/2016   Procedure: Left Heart Cath and Coronary Angiography;  Surgeon: Sherren Mocha, MD;  Location: [facility];  Service: Cardiovascular;  Laterality: N/A;   CARDIAC CATHETERIZATION N/A 08/14/2016   Procedure: Coronary Stent Intervention;  Surgeon: Sherren Mocha, MD;  Location: [facility];  Service: Cardiovascular;  Laterality: N/A;   CATARACT EXTRACTION W/ INTRAOCULAR LENS  IMPLANT, BILATERAL Bilateral 05/2009   COLONOSCOPY N/A 2016   Cologuard 2019   CORONARY ANGIOPLASTY     detached retina left eye     may 2019  LEFT HEART CATH AND CORONARY ANGIOGRAPHY N/A 09/25/2022   Procedure: LEFT HEART CATH AND CORONARY ANGIOGRAPHY;  Surgeon: Burnell Blanks, MD;  Location: [facility];  Service: Cardiovascular;  Laterality: N/A;   TUBAL LIGATION  1970s   Social History   Socioeconomic History   Marital status: Divorced    Spouse name: Not on file   Number of children: Not on file   Years of education: Not on file   Highest education level: Not on file  Occupational History   Occupation: Cleaning  Tobacco Use   Smoking status: Former     Packs/day: 1.00    Years: 26.00    Total pack years: 26.00    Types: Cigarettes    Quit date: 1989    Years since quitting: 35.0   Smokeless tobacco: Never  Vaping Use   Vaping Use: Never used  Substance and Sexual Activity   Alcohol use: No   Drug use: No   Sexual activity: Not Currently  Other Topics Concern   Not on file  Social History Narrative   Divorced for 55 years in 2016. 2 sons- 1 son had been in prison now living with her in 2019.  2 granddaughters with 1 living close.        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 Best friend [name] patient of Dr. Yong Channel. Enjoys work, time on Teaching laboratory technician.    Social Determinants of Health   Financial Resource Strain: Low Risk  (01/07/2022)   Overall Financial Resource Strain (CARDIA)    Difficulty of Paying Living Expenses: Not very hard  Food Insecurity: No Food Insecurity (01/07/2022)   Hunger Vital Sign    Worried About Running Out of Food in the Last Year: Never true    Ran Out of Food in the Last Year: Never true  Transportation Needs: No Transportation Needs (01/07/2022)   PRAPARE - Hydrologist (Medical): No    Lack of Transportation (Non-Medical): No  Physical Activity: Sufficiently Active (09/29/2021)   Exercise Vital Sign    Days of Exercise per Week: 2 days    Minutes of Exercise per Session: 120 min  Stress: Stress Concern Present (09/29/2021)   [place]    Feeling of Stress : To some extent  Social Connections: Moderately Isolated (09/29/2021)   Social Connection and Isolation Panel [NHANES]    Frequency of Communication with Friends and Family: More than three times a week    Frequency of Social Gatherings with Friends and Family: More than three times a week    Attends Religious  Services: More than 4 times per year    Active Member of [employer] or Organizations: No    Attends Archivist Meetings: Never    Marital Status: Divorced   Allergies  Allergen Reactions   Penicillins Anaphylaxis    REACTION: per patient causes rash,hives   Cephalosporins Swelling    REACTION: tongue swelling   Citalopram Hydrobromide Other (See Comments)    psychosis   Rosuvastatin Other (See Comments)    Pt reports causes bilateral lower extremity muscle aches.    Zolpidem Tartrate     REACTION: difficulty with concentration   Clarithromycin Rash    REACTION: blisters in mouth   Levofloxacin Rash  REACTION: tongue swells   Family History  Problem Relation Age of Onset   Heart disease Mother    Hypertension Mother    Diabetes Mother    Dementia Mother    COPD Father    Dementia Maternal Grandmother    Colon cancer Neg Hx      Current Outpatient Medications (Cardiovascular):    amLODipine (NORVASC) 10 MG tablet, Take 1 tablet (10 mg total) by mouth daily.   Bempedoic Acid-Ezetimibe (NEXLIZET) 180-10 MG TABS, Take 180 mg by mouth daily.   carvedilol (COREG) 25 MG tablet, Take 1 tablet (25 mg total) by mouth 2 (two) times daily with a meal.   irbesartan (AVAPRO) 300 MG tablet, Take 1 tablet (300 mg total) by mouth daily.   omega-3 acid ethyl esters (LOVAZA) 1 g capsule, TAKE TWO CAPSULES EVERY DAY   spironolactone (ALDACTONE) 25 MG tablet, Take 0.5 tablets (12.5 mg total) by mouth daily.  Current Outpatient Medications (Respiratory):    albuterol (VENTOLIN HFA) 108 (90 Base) MCG/ACT inhaler, Inhale 2 puffs into the lungs every 6 (six) hours as needed for wheezing or shortness of breath.   budesonide-formoterol (SYMBICORT) 160-4.5 MCG/ACT inhaler, Inhale 2 puffs into the lungs 2 (two) times daily.   levocetirizine (XYZAL) 5 MG tablet, Take 1 tablet (5 mg total) by mouth every morning.  Current Outpatient Medications (Analgesics):    acetaminophen (TYLENOL) 325 MG  tablet, Take 650 mg by mouth every 6 (six) hours as needed for mild pain, moderate pain or headache.   aspirin EC 81 MG tablet, Take 1 tablet (81 mg total) by mouth daily. Swallow whole.   Current Outpatient Medications (Other):    ALPRAZolam (XANAX) 1 MG tablet, Take 1 tablet (1 mg total) by mouth at bedtime. DO NOT DRIVE FOR 8 HOURS AFTER TAKING   anastrozole (ARIMIDEX) 1 MG tablet, Take 1 tablet (1 mg total) by mouth daily.   famotidine (PEPCID) 20 MG tablet, TAKE ONE TABLET TWICE DAILY   ondansetron (ZOFRAN-ODT) 8 MG disintegrating tablet, Take 1 tablet (8 mg total) by mouth every 8 (eight) hours as needed for nausea or vomiting.   venlafaxine XR (EFFEXOR-XR) 75 MG 24 hr capsule, TAKE ONE CAPSULE EACH DAY WITH BREAKFAST   Reviewed prior external information including notes and imaging from  primary care provider As well as notes that were available from care everywhere and other healthcare systems.  Past medical history, social, surgical and family history all reviewed in electronic medical record.  No pertanent information unless stated regarding to the chief complaint.   Review of Systems:  No headache, visual changes, nausea, vomiting, diarrhea, constipation, dizziness, abdominal pain, skin rash, fevers, chills, night sweats, weight loss, swollen lymph nodes, body aches, joint swelling, chest pain, shortness of breath, mood changes. POSITIVE muscle aches  Objective  There were no vitals taken for this visit.   General: No apparent distress alert and oriented x3 mood and affect normal, dressed appropriately.  HEENT: Pupils equal, extraocular movements intact  Respiratory: Patient's speak in full sentences and does not appear short of breath  Cardiovascular: No lower extremity edema, non tender, no erythema      Impression and Recommendations:

## 2022-11-23 ENCOUNTER — Ambulatory Visit: Payer: Medicare Other | Admitting: Family Medicine

## 2022-12-02 ENCOUNTER — Ambulatory Visit: Payer: Medicare Other

## 2022-12-02 NOTE — Progress Notes (Deleted)
Patient ID: Felicia Perry                 DOB: 31-May-1946                      MRN: [id number]      HPI: Felicia Perry is a 77 y.o. female referred by Dr. Marland Kitchen to HTN clinic. PMH is significant for CAD (Dx 2017 with RCA CTO status post complex PCI, 60% LCx disease, normal LVEF 08/14/2016), chronic diastolic CHF, carotid artery disease, HLD with statin intolerance, HTN, PUD, asthma, anxiety and diabetes.   cautious with HCTZ due to hercalcemia history   Current HTN meds: : Amlodipine 10 mg, irbesartan 300 mg, coreg 25 mg BID spironolactone 25 mg  Previously tried:  BP goal:   Family History:   Social History:   Diet:   Exercise:  {types:28256}  Home BP readings:  Date SBP/DBP  HR                              Average      Wt Readings from Last 3 Encounters:  11/12/22 163 lb (73.9 kg)  11/06/22 165 lb 6.4 oz (75 kg)  10/13/22 163 lb 9.6 oz (74.2 kg)   BP Readings from Last 3 Encounters:  11/12/22 132/78  11/06/22 (!) 142/82  10/13/22 (!) 144/78   Pulse Readings from Last 3 Encounters:  11/12/22 71  11/06/22 66  10/13/22 (!) 50    Renal function: CrCl cannot be calculated (Unknown ideal weight.).  Past Medical History:  Diagnosis Date   Adenomatous polyp 11/23/2005   Anxiety    01/09/22- patient denies anxiety   Asthma 2003   Basal cell carcinoma 2021   right cheek per patient at [facility]   Carotid stenosis    Chronic diastolic CHF (congestive heart failure) ([place]) 2017   Echo 2021 was normal   COPD (chronic obstructive pulmonary disease) ([place])    Coronary artery disease 2017   a. HC on 08/14/16 showed RCA CTO s/p PCI, 60% LCX disease managed medically with recs to consider staged PCI if continued symptoms.    Depression 2008   pt reports MD diagnosed but she does not feel depressed   Diabetes mellitus without complication ([place])    DIVERTICULOSIS, COLON 04/22/2007        GERD (gastroesophageal reflux disease) 2012   History of radiation  therapy    Right breast- 03/04/22-04/02/22- Dr. Gery Pray   History of shingles    Hypertension 2008   Hypertensive heart disease    Insomnia 2003   Internal hemorrhoids    Osteoarthritis 2008   "knees, hands, lower back" (08/14/2016)   Pap smear for cervical cancer screening 2002   results normal   Personal history of radiation therapy    PUD (peptic ulcer disease) 1989   Seasonal allergies 2013   Sleep apnea    a. resolved with weight loss    Current Outpatient Medications on File Prior to Visit  Medication Sig Dispense Refill   acetaminophen (TYLENOL) 325 MG tablet Take 650 mg by mouth every 6 (six) hours as needed for mild pain, moderate pain or headache.     albuterol (VENTOLIN HFA) 108 (90 Base) MCG/ACT inhaler Inhale 2 puffs into the lungs every 6 (six) hours as needed for wheezing or shortness of breath. 18 g 5   ALPRAZolam (XANAX) 1 MG tablet Take 1 tablet (1  mg total) by mouth at bedtime. DO NOT DRIVE FOR 8 HOURS AFTER TAKING 90 tablet 1   amLODipine (NORVASC) 10 MG tablet Take 1 tablet (10 mg total) by mouth daily. 90 tablet 2   anastrozole (ARIMIDEX) 1 MG tablet Take 1 tablet (1 mg total) by mouth daily. 30 tablet 3   aspirin EC 81 MG tablet Take 1 tablet (81 mg total) by mouth daily. Swallow whole. 90 tablet 3   Bempedoic Acid-Ezetimibe (NEXLIZET) 180-10 MG TABS Take 180 mg by mouth daily. 90 tablet 3   budesonide-formoterol (SYMBICORT) 160-4.5 MCG/ACT inhaler Inhale 2 puffs into the lungs 2 (two) times daily. 1 each 11   carvedilol (COREG) 25 MG tablet Take 1 tablet (25 mg total) by mouth 2 (two) times daily with a meal. 180 tablet 3   famotidine (PEPCID) 20 MG tablet TAKE ONE TABLET TWICE DAILY 60 tablet 5   irbesartan (AVAPRO) 300 MG tablet Take 1 tablet (300 mg total) by mouth daily. 90 tablet 2   levocetirizine (XYZAL) 5 MG tablet Take 1 tablet (5 mg total) by mouth every morning. 90 tablet 3   omega-3 acid ethyl esters (LOVAZA) 1 g capsule TAKE TWO CAPSULES EVERY  DAY 180 capsule 3   ondansetron (ZOFRAN-ODT) 8 MG disintegrating tablet Take 1 tablet (8 mg total) by mouth every 8 (eight) hours as needed for nausea or vomiting. 20 tablet 0   spironolactone (ALDACTONE) 25 MG tablet Take 0.5 tablets (12.5 mg total) by mouth daily. 45 tablet 3   venlafaxine XR (EFFEXOR-XR) 75 MG 24 hr capsule TAKE ONE CAPSULE EACH DAY WITH BREAKFAST 90 capsule 2   No current facility-administered medications on file prior to visit.    Allergies  Allergen Reactions   Penicillins Anaphylaxis    REACTION: per patient causes rash,hives   Cephalosporins Swelling    REACTION: tongue swelling   Citalopram Hydrobromide Other (See Comments)    psychosis   Rosuvastatin Other (See Comments)    Pt reports causes bilateral lower extremity muscle aches.    Zolpidem Tartrate     REACTION: difficulty with concentration   Clarithromycin Rash    REACTION: blisters in mouth   Levofloxacin Rash    REACTION: tongue swells    There were no vitals taken for this visit.   Assessment/Plan:  1. Hypertension -  No problem-specific Assessment & Plan notes found for this encounter.      Thank you  Ramond Dial, Pharm.D, BCPS, CPP [facility] of [facility] [address], [place], [place] [address]  Phone: [phone]; Fax: [phone]

## 2022-12-23 ENCOUNTER — Other Ambulatory Visit: Payer: Self-pay | Admitting: Hematology and Oncology

## 2023-01-07 ENCOUNTER — Ambulatory Visit: Payer: Medicare Other | Admitting: Physician Assistant

## 2023-01-08 ENCOUNTER — Telehealth: Payer: Self-pay | Admitting: Emergency Medicine

## 2023-01-08 NOTE — Telephone Encounter (Signed)
MTG-015 - Tissue and Bodily Fluids: Translational Medicine: Discovery and Evaluation of Biomarkers/Pharmacogenomics for the Diagnosis and Personalized Management of Patients   MT 3505-A: 12 month f/u call  Attempted to contact patient.  No answer.  ROI on file.  Requested call back and provided contact info.  Will attempt to f/u again next week.  Wells Guiles 'Learta Codding' Neysa Bonito, RN, BSN, [facility] Clinical Research Nurse I 01/08/23 12:59 PM

## 2023-01-11 ENCOUNTER — Ambulatory Visit: Payer: Medicare Other

## 2023-01-12 NOTE — Progress Notes (Signed)
Cardiology Office Note:    Date:  01/15/2023   ID:  Felicia Perry, Felicia Perry 09/05/1946, MRN [id number]  PCP:  Marin Olp, MD   [facility] Providers Cardiologist:  [name], MD {   Referring MD: Marin Olp, MD    History of Present Illness:    Felicia Perry is a 77 y.o. female with a hx of CAD (dx 2017 - RCA CTO s/p complex PCI, 60% LCx disease, normal LVEF 08/14/16),  chronic diastolic CHF, carotid artery disease, HLD with statin intolerance, HTN, PUD, asthma, anxiety who was previously followed by Dr. Meda Coffee who now presents to clinic for follow-up.  Per review of the record, the patient was seen by Dr. Meda Coffee on 08/12/16 for evaluation of chest pain and a positive stress test which was ordered by PCP. This demonstrated a hypotensive response to exercise and a medium defect of moderate severity present in the basal inferoseptal, mid inferoseptal and apical septal and mid and apical inferior location that was reversible and consistent with ischemia.  LHC on 08/14/16 showed RCA CTO s/p complex PCI, 60% LCX disease managed medically with recs to consider staged PCI if continued symptoms. Myoview  01/2017 was low risk study. TTE 01/2017 showed LVEF of 55-60%, elevated filling pressure, mild to moderate AI.  Last myoview 02/15/20 with EF 71%, mild defect in apex but no ischemia. TTE 02/15/20 with LVEF 60-65%, severe LVH, mild MR, aortic sclerosis. Carotid 06/27/20 with RICA [id number] and LICA [id number].  Carotid ultrasound 06/2022 with [id number] in the LICA and [id number] RICA.   Was seen in clinic on 07/2022 where she was having episodes of SOB with exertion. We obtained a cardiac PET which showed mid inferolateral ischemia , TID and lack of augmentation of systolic function with stress. MBFR was normal. Discussion held with the patient and given known Lcx disease and symptoms, decision was made to proceed with cath.  LHC 09/2022 with stable 60% Lcx, 10% RCA, 60% OM2, patent distal  RCA stent, normal LVEDP.   Today, the patient overall feels okay. She continues to have intermittent dsypnea on exertion. 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 Notably, symptoms only occur if she walks outside. She has underlying asthma and thinks this may be contributing. Otherwise, she is doing well from a CV perspective. No chest pain, LE edema, orthopnea, PND or palpitations.    Past Medical History:  Diagnosis Date   Adenomatous polyp 11/23/2005   Anxiety    01/09/22- patient denies anxiety   Asthma 2003   Basal cell carcinoma 2021   right cheek per patient at [place] [facility]   Carotid stenosis    Chronic diastolic CHF (congestive heart failure) ([place]) 2017   Echo 2021 was normal   COPD (chronic obstructive pulmonary disease) ([place])    Coronary artery disease 2017   a. HC on 08/14/16 showed RCA CTO s/p PCI, 60% LCX disease managed medically with recs to consider staged PCI if continued symptoms.    Depression 2008   pt reports MD diagnosed but she does not feel depressed   Diabetes mellitus without complication ([place])    DIVERTICULOSIS, COLON 04/22/2007        GERD (gastroesophageal reflux disease) 2012   History of radiation therapy    Right breast- 03/04/22-04/02/22- Dr. Gery Pray   History of shingles    Hypertension 2008   Hypertensive heart disease  Insomnia 2003   Internal hemorrhoids    Osteoarthritis 2008   "knees, hands, lower back" (08/14/2016)   Pap smear for cervical cancer screening 2002   results normal   Personal history of radiation therapy    PUD (peptic ulcer disease) 1989   Seasonal allergies 2013   Sleep apnea    a. resolved with weight loss    Past Surgical History:  Procedure Laterality Date   BREAST BIOPSY Right 12/30/2021   BREAST EXCISIONAL BIOPSY Left    1980's x 3 - all benign   BREAST EXCISIONAL BIOPSY Right    1980's x 1 or 2 - all benign   BREAST  LUMPECTOMY Right 01/22/2022   BREAST LUMPECTOMY WITH RADIOACTIVE SEED LOCALIZATION Right 01/22/2022   Procedure: RIGHT BREAST LUMPECTOMY WITH RADIOACTIVE SEED LOCALIZATION;  Surgeon: Erroll Luna, MD;  Location: [facility];  Service: General;  Laterality: Right;   BREAST SURGERY Left 1980s   Fibrous Tumors removed    CARDIAC CATHETERIZATION N/A 08/14/2016   Procedure: Left Heart Cath and Coronary Angiography;  Surgeon: Sherren Mocha, MD;  Location: [facility];  Service: Cardiovascular;  Laterality: N/A;   CARDIAC CATHETERIZATION N/A 08/14/2016   Procedure: Coronary Stent Intervention;  Surgeon: Sherren Mocha, MD;  Location: [facility];  Service: Cardiovascular;  Laterality: N/A;   CATARACT EXTRACTION W/ INTRAOCULAR LENS  IMPLANT, BILATERAL Bilateral 05/2009   COLONOSCOPY N/A 2016   Cologuard 2019   CORONARY ANGIOPLASTY     detached retina left eye     may 2019   LEFT HEART CATH AND CORONARY ANGIOGRAPHY N/A 09/25/2022   Procedure: LEFT HEART CATH AND CORONARY ANGIOGRAPHY;  Surgeon: Burnell Blanks, MD;  Location: [facility];  Service: Cardiovascular;  Laterality: N/A;   TUBAL LIGATION  1970s    Current Medications: Current Meds  Medication Sig   acetaminophen (TYLENOL) 325 MG tablet Take 650 mg by mouth every 6 (six) hours as needed for mild pain, moderate pain or headache.   albuterol (VENTOLIN HFA) 108 (90 Base) MCG/ACT inhaler Inhale 2 puffs into the lungs every 6 (six) hours as needed for wheezing or shortness of breath.   ALPRAZolam (XANAX) 1 MG tablet Take 1 tablet (1 mg total) by mouth at bedtime. DO NOT DRIVE FOR 8 HOURS AFTER TAKING   amLODipine (NORVASC) 10 MG tablet Take 1 tablet (10 mg total) by mouth daily.   anastrozole (ARIMIDEX) 1 MG tablet TAKE ONE TABLET DAILY   aspirin EC 81 MG tablet Take 1 tablet (81 mg total) by mouth daily. Swallow whole.   Bempedoic Acid-Ezetimibe (NEXLIZET) 180-10 MG TABS Take 180 mg by mouth  daily.   budesonide-formoterol (SYMBICORT) 160-4.5 MCG/ACT inhaler Inhale 2 puffs into the lungs 2 (two) times daily.   carvedilol (COREG) 25 MG tablet Take 1 tablet (25 mg total) by mouth 2 (two) times daily with a meal.   famotidine (PEPCID) 20 MG tablet TAKE ONE TABLET TWICE DAILY   irbesartan (AVAPRO) 300 MG tablet Take 1 tablet (300 mg total) by mouth daily.   levocetirizine (XYZAL) 5 MG tablet Take 1 tablet (5 mg total) by mouth every morning.   omega-3 acid ethyl esters (LOVAZA) 1 g capsule TAKE TWO CAPSULES EVERY DAY   ondansetron (ZOFRAN-ODT) 8 MG disintegrating tablet Take 1 tablet (8 mg total) by mouth every 8 (eight) hours as needed for nausea or vomiting.   spironolactone (ALDACTONE) 25 MG tablet Take 0.5 tablets (12.5 mg total) by mouth daily.  venlafaxine XR (EFFEXOR-XR) 75 MG 24 hr capsule TAKE ONE CAPSULE EACH DAY WITH BREAKFAST     Allergies:   Penicillins, Cephalosporins, Citalopram hydrobromide, Rosuvastatin, Zolpidem tartrate, Clarithromycin, and Levofloxacin   Social History   Socioeconomic History   Marital status: Divorced    Spouse name: Not on file   Number of children: Not on file   Years of education: Not on file   Highest education level: Not on file  Occupational History   Occupation: Cleaning  Tobacco Use   Smoking status: Former    Packs/day: 1.00    Years: 26.00    Additional pack years: 0.00    Total pack years: 26.00    Types: Cigarettes    Quit date: 1989    Years since quitting: 35.2   Smokeless tobacco: Never  Vaping Use   Vaping Use: Never used  Substance and Sexual Activity   Alcohol use: No   Drug use: No   Sexual activity: Not Currently  Other Topics Concern   Not on file  Social History Narrative   Divorced for 70 years in 2016. 2 sons- 1 son had been in prison now living with her in 2019.  2 granddaughters with 1 living close.        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 Best friend [name] patient of Dr. Yong Channel. Enjoys work, time on Teaching laboratory technician.    Social Determinants of Health   Financial Resource Strain: Low Risk  (01/07/2022)   Overall Financial Resource Strain (CARDIA)    Difficulty of Paying Living Expenses: Not very hard  Food Insecurity: No Food Insecurity (01/07/2022)   Hunger Vital Sign    Worried About Running Out of Food in the Last Year: Never true    Ran Out of Food in the Last Year: Never true  Transportation Needs: No Transportation Needs (01/07/2022)   PRAPARE - Hydrologist (Medical): No    Lack of Transportation (Non-Medical): No  Physical Activity: Sufficiently Active (09/29/2021)   Exercise Vital Sign    Days of Exercise per Week: 2 days    Minutes of Exercise per Session: 120 min  Stress: Stress Concern Present (09/29/2021)   [place]    Feeling of Stress : To some extent  Social Connections: Moderately Isolated (09/29/2021)   Social Connection and Isolation Panel [NHANES]    Frequency of Communication with Friends and Family: More than three times a week    Frequency of Social Gatherings with Friends and Family: More than three times a week    Attends Religious Services: More than 4 times per year    Active Member of [employer] or Organizations: No    Attends Music therapist: Never    Marital Status: Divorced     Family History: The patient's family history includes COPD in her father; Dementia in her maternal grandmother and mother; Diabetes in her mother; Heart disease in her mother; Hypertension in her mother. There is no history of Colon cancer.  ROS:   Please see the history of present illness.    Review of Systems  Constitutional:  Negative for chills, fever, malaise/fatigue and weight loss.  HENT:  Negative for hearing loss.    Eyes:  Negative for blurred vision.  Respiratory:  Positive for shortness of breath. Negative for cough.   Cardiovascular:  Negative for chest pain, palpitations, orthopnea, claudication, leg swelling and PND.  Gastrointestinal:  Negative for nausea and vomiting.  Genitourinary:  Negative for hematuria.  Musculoskeletal:  Positive for joint pain (Bilateral knees). Negative for falls.  Neurological:  Negative for dizziness.  Psychiatric/Behavioral:  The patient is not nervous/anxious and does not have insomnia.    All other systems reviewed and negative  EKGs/Labs/Other Studies Reviewed:    The following studies were reviewed today:  LHC 09/2022:   Ost RCA to Prox RCA lesion is 10% stenosed.   Prox Cx to Mid Cx lesion is 60% stenosed.   Mid Cx to Dist Cx lesion is 40% stenosed.   2nd Mrg lesion is 60% stenosed.   Previously placed Dist RCA stent of unknown type is  widely patent.   The left ventricular systolic function is normal.   LV end diastolic pressure is normal.   The left ventricular ejection fraction is greater than 65% by visual estimate.   There is no mitral valve regurgitation.   The LAD is a moderate to large caliber vessel that does not wrap around the apex. No angiographic evidence of disease in this vessel. Moderate caliber first diagonal branch with mild plaque.  The Circumflex is a large caliber vessel with two moderate caliber obtuse marginal branches. There is moderate disease in the AV groove Circumflex just beyond the takeoff of the first obtuse marginal branch and moderate disease at the takeoff of the second obtuse marginal branch. These lesions do not appear to be flow limiting.  The RCA is a large dominant artery with patent proximal, mid and distal stents with minimal restenosis.  Normal LVEDP Normal LV systolic function   Recommendations: Stable CAD with no flow limiting lesions. Moderate stenosis in the Circumflex should not be contributing to her  dyspnea. Normal LV filling pressure with preserved LV systolic function. Explore other reasons for dyspnea.   NM PET 08/2022:  Findings are consistent with a small region of ischemia. The study is intermediate-to-high risk due to the presence of TID and lack of augmentation of systolic function with stress. There is very mild ischemia noted and normal myocardial blood flow reserve.   LV perfusion is abnormal. Defect 1: There is a small defect with mild reduction in uptake present in the mid inferolateral location(s) that is reversible. There is abnormal wall motion in the defect area. Consistent with ischemia.   Rest left ventricular function is normal. Rest EF: 64 %. Stress left ventricular function is normal, but there was no augmentation in systolic function when compared to rest. Stress EF: 64 %. End diastolic cavity size is normal. End systolic cavity size is normal.   Myocardial blood flow was computed to be 0.81ml/g/min at rest and 2.62ml/g/min at stress. Global myocardial blood flow reserve was 3.00 and was normal.   Coronary calcium was present on the attenuation correction CT images. Severe coronary calcifications were present. Coronary calcifications were present in the left anterior descending artery, left circumflex artery and right coronary artery distribution(s).  Carotids 06/26/22: Summary:  Right Carotid: Velocities in the right ICA are consistent with a 1-39%  stenosis.   Left Carotid: Velocities in the left ICA are consistent with a 60-79%  stenosis.                Non-hemodynamically significant plaque <50% noted in the  CCA. The  ECA appears >50% stenosed.   Vertebrals:  Bilateral vertebral arteries demonstrate antegrade flow.  Subclavians: Normal flow hemodynamics were seen in bilateral subclavian               arteries.   Carotids 06/27/21 Right Carotid: Velocities in the right ICA are consistent with a 1-39%  stenosis.   Left Carotid: Velocities in the  left ICA are consistent with a 40-59%  stenosis. Non-hemodynamically significant plaque <50% noted in the  CCA.   Vertebrals:  Bilateral vertebral arteries demonstrate antegrade flow.  Subclavians: Normal flow hemodynamics were seen in bilateral subclavian arteries.  CTA Head/Neck 01/02/21 IMPRESSION: 1. No intracranial arterial occlusion or high-grade stenosis. 2. 60 % stenosis of the proximal left internal carotid artery secondary to predominantly calcified atherosclerosis.   Aortic Atherosclerosis (ICD10-I70.0).  Myoview 02/15/20: 08/12/16 for evaluation of chest pain and a positive stress test which was ordered by PCP. This demonstrated a hypotensive response to exercise and a medium defect of moderate severity present in the basal inferoseptal, mid inferoseptal and apical septal and mid and apical inferior location that was reversible and consistent with ischemia.  LHC on 08/14/16 showed RCA CTO s/p complex PCI, 60% LCX disease managed medically with recs to consider staged PCI if continued symptoms. Last stress test 01/2017 was low risk study. Echo 01/2017 showed LVEF of 55-60%, elevated filling pressure, mild to moderate AI.  TTE 02/15/20: IMPRESSIONS   1. Left ventricular ejection fraction, by estimation, is 60 to 65%. The  left ventricle has normal function. The left ventricle has no regional  wall motion abnormalities. The left ventricular internal cavity size was  mildly dilated. There is severe left ventricular hypertrophy. Left ventricular diastolic parameters were normal.   2. Right ventricular systolic function is normal. The right ventricular  size is normal. There is mildly elevated pulmonary artery systolic  pressure.   3. The mitral valve is normal in structure. Mild mitral valve  regurgitation. No evidence of mitral stenosis.   4. The aortic valve is tricuspid. Aortic valve regurgitation is mild.  Sclerosis with small gradient but no stenosis.   5. The inferior vena  cava is normal in size with greater than 50%  respiratory variability, suggesting right atrial pressure of 3 mmHg.   Carotid 06/27/20: Summary:  Right Carotid: Velocities in the right ICA are consistent with a 1-39%  stenosis.   Left Carotid: Velocities in the left ICA are consistent with a 40-59%  stenosis. Non-hemodynamically significant plaque <50% noted in the  CCA.   Vertebrals:  Bilateral vertebral arteries demonstrate antegrade flow.   Subclavians: Normal flow hemodynamics were seen in bilateral subclavian arteries.   EKG:  No new ECG today.  Recent Labs: 11/12/2022: ALT 20; BUN 25; Creatinine, Ser 1.03; Hemoglobin 14.2; Platelets 334.0; Potassium 4.1; Sodium 142   Recent Lipid Panel    Component Value Date/Time   CHOL 120 11/12/2022 1637   CHOL 100 06/22/2019 0907   TRIG 294.0 (H) 11/12/2022 1637   HDL 32.30 (L) 11/12/2022 1637   HDL 32 (L) 06/22/2019 0907   CHOLHDL 4 11/12/2022 1637   VLDL 58.8 (H) 11/12/2022 1637   LDLCALC 48 08/07/2020 0845   LDLDIRECT 58.0 11/12/2022 1637     Risk Assessment/Calculations:       Physical Exam:    VS:  BP 126/88   Pulse 60   Ht 5' (1.524 m)   Wt 161 lb 9.6 oz (73.3 kg)   SpO2 95%   BMI 31.56 kg/m  Wt Readings from Last 3 Encounters:  01/15/23 161 lb 9.6 oz (73.3 kg)  11/12/22 163 lb (73.9 kg)  11/06/22 165 lb 6.4 oz (75 kg)     GEN:  Comfortable, NAD HEENT: Normal NECK: No JVD; No carotid bruits CARDIAC: RRR, 2/6 systolic murmur, No rubs, no gallops RESPIRATORY:  Clear to auscultation without rales, wheezing or rhonchi  ABDOMEN: Soft, non-tender, non-distended MUSCULOSKELETAL:  No edema; No deformity  SKIN: Warm and dry NEUROLOGIC:  Alert and oriented x 3 PSYCHIATRIC:  Normal affect   ASSESSMENT:    1. Coronary artery disease involving native coronary artery of native heart with angina pectoris ([place])   2. Chronic heart failure with preserved ejection fraction ([place])   3. SOB (shortness of breath)   4.  Mild aortic stenosis   5. Mild mitral regurgitation   6. Obesity (BMI 30-39.9)   7. Stenosis of carotid artery, unspecified laterality   8. Essential hypertension       PLAN:    In order of problems listed above:  #Coronary Artery Disease: #DOE: LHC in 2017 with RCA CTO s/p complex PCI, 60% LCx disease. TTE 01/2020 with LVEF 60-65%.. NM PET with mild ischemia in the mid inferolateral wall as well as TID and lack of systolic augmentation with stress. MBFR normal. LHC with no obstructive disease as detailed above. Will continue with medical management. -Continue ASA 81mg  daily  -Continue irbesartan 300mg  daily -Continue coreg 12.5mg  BID -Intolerant to statins; current on nexlizet 180-10mg  daily; LDL well controlled in 50s -Continue lifestyle modifications as detailed below  #HTN: Much better controlled and at goal. -Continue irbesartan 300mg  daily -Continue coreg to 12.5mg  BID -Continue amlodipine 10mg  daily -Continue spironolactone 25mg  daily   #Chronic Diastolic Heart Failure: Euvolemic with NYHA class II symptoms. TTE with LVEF 60-65%, severe LVH. -Not on diuretics -Continue irbesartan 300mg  daily -Continue amlodipine 10mg  daily -Continue spironolactone 25mg  daily -Low Na diet   #HLD: #Statin Intolerance: LDL at goal in 50s.  -Continue nexlizet 180-10mg  daily   #Carotid Artery Disease: LICA with [id number], RICA with 1-39% 06/2022. Will continue serial monitoring.  -Continue nexlizet 180-10mg  daily -On ASA 81mg  daily  -Repeat carotid ultrasound 06/2023  #Mild AS: Mean gradient 28mmHg on 09/2022. -Repeat TTE in 09/2024 for monitoring  #Obesity with BMI 32: -Continue lifestyle modifications as detailed below   Exercise recommendations: Goal of exercising for at least 30 minutes a day, at least 5 times per week.  Please exercise to a moderate exertion.  This means that while exercising it is difficult to speak in full sentences, however you are not so short of breath  that you feel you must stop, and not so comfortable that you can carry on a full conversation.  Exertion level should be approximately a 5/10, if 10 is the most exertion you can perform.  Diet recommendations: Recommend a heart healthy diet such as the Mediterranean diet.  This diet consists of plant based foods, healthy fats, lean meats, olive oil.  It suggests limiting the intake of simple carbohydrates such as white breads, pastries, and pastas.  It also limits the amount of red meat, wine, and dairy products such as cheese that one should consume on a daily basis.      Medication Adjustments/Labs and Tests Ordered: Current medicines are reviewed at length with the patient today.  Concerns regarding medicines are outlined above.   Orders Placed This Encounter  Procedures   VAS US CAROTID   No orders of the defined types were  placed in this encounter.  Patient Instructions  Medication Instructions:   Your physician recommends that you continue on your current medications as directed. Please refer to the Current Medication list given to you today.  *If you need a refill on your cardiac medications before your next appointment, please call your pharmacy*    Testing/Procedures:  Your physician has requested that you have a carotid duplex. This test is an ultrasound of the carotid arteries in your neck. It looks at blood flow through these arteries that supply the brain with blood. Allow one hour for this exam. There are no restrictions or special instructions.  SCHEDULE TO BE DONE IN SEPTEMBER 2024 PER DR. Johney Frame    Follow-Up: At [facility], you and your health needs are our priority.  As part of our continuing mission to provide you with exceptional heart care, we have created designated Provider Care Teams.  These Care Teams include your primary Cardiologist (physician) and Advanced Practice Providers (APPs -  Physician Assistants and Nurse Practitioners) who all work  together to provide you with the care you need, when you need it.  We recommend signing up for the patient portal called "MyChart".  Sign up information is provided on this After Visit Summary.  MyChart is used to connect with patients for Virtual Visits (Telemedicine).  Patients are able to view lab/test results, encounter notes, upcoming appointments, etc.  Non-urgent messages can be sent to your provider as well.   To learn more about what you can do with MyChart, go to [email or web address].    Your next appointment:   6 month(s)  Provider:   [name], MD          Signed, [name], MD  01/15/2023 8:56 AM    [place]

## 2023-01-13 IMAGING — MG MM DIGITAL SCREENING BILAT W/ TOMO AND CAD
8 series · 9 of 24 positions shown · non-contrast
Comparison: Previous exam(s).

CLINICAL DATA: Screening.

EXAM:
DIGITAL SCREENING BILATERAL MAMMOGRAM WITH TOMO AND CAD

[L CC synth-2D]
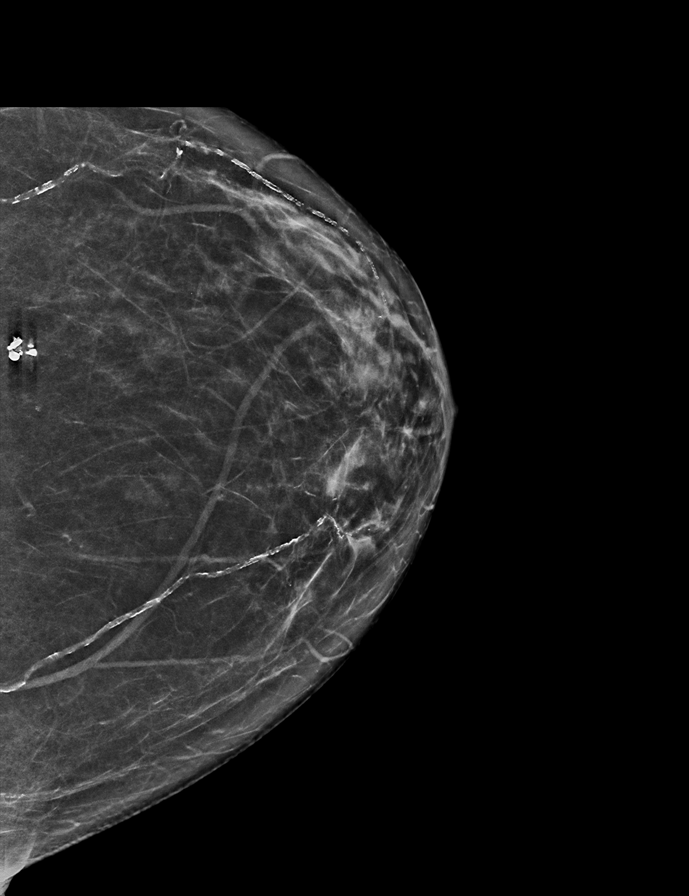

[L MLO synth-2D]
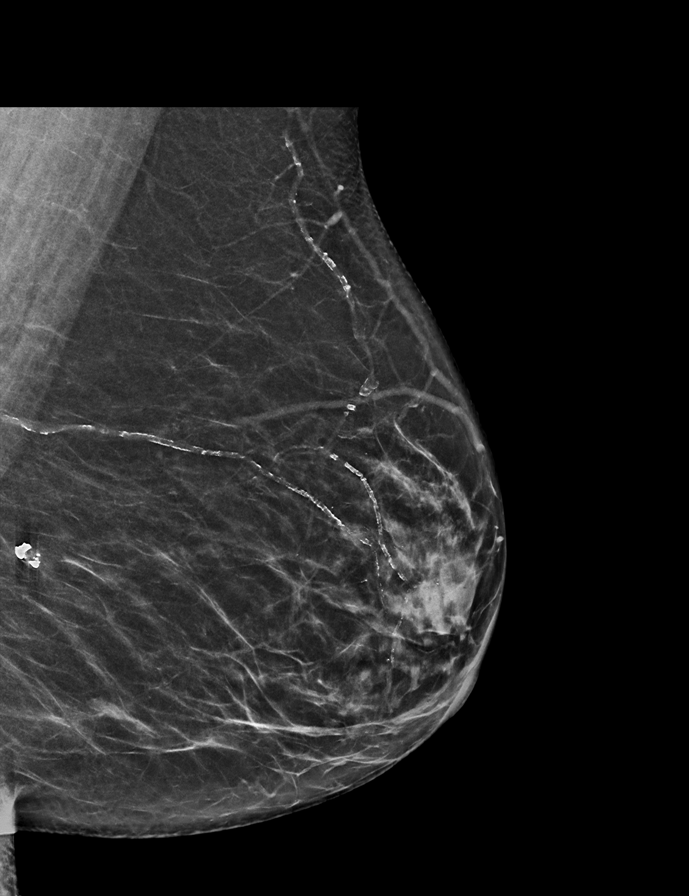

[R MLO synth-2D]
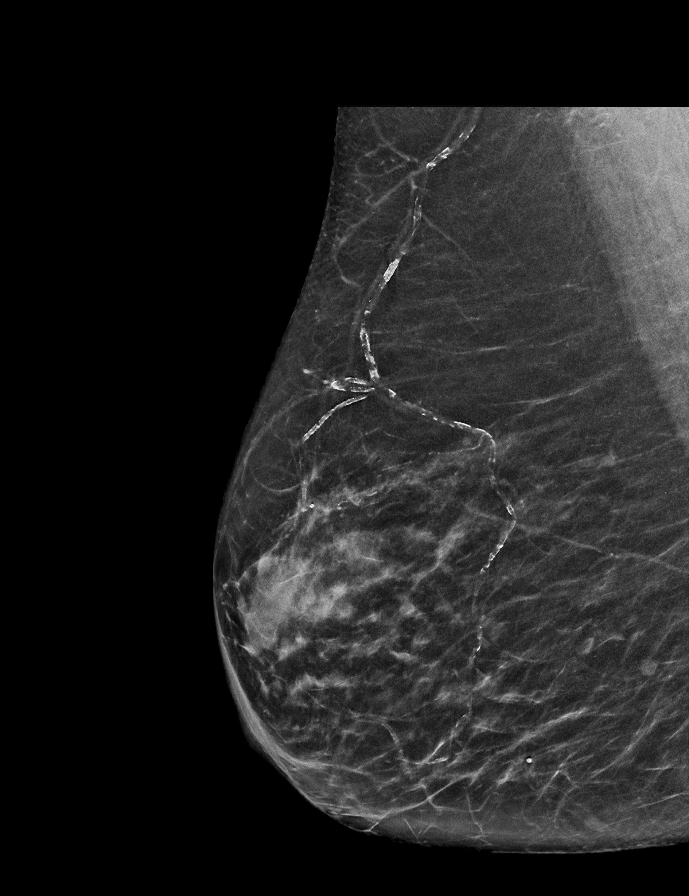

[R CC synth-2D]
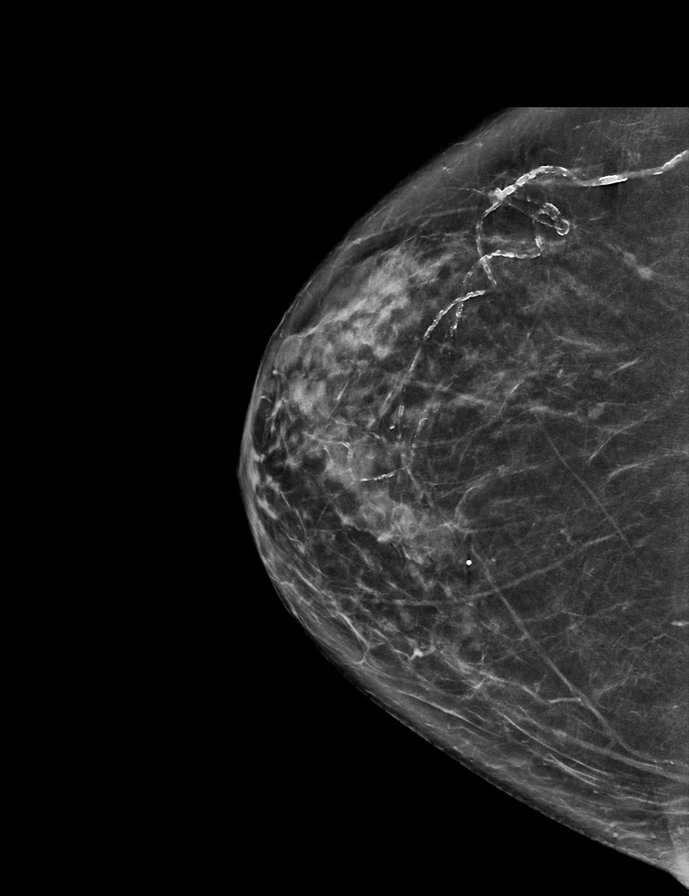

[R CC tomo · 2 of 65 frames shown]
[frame 21/65]
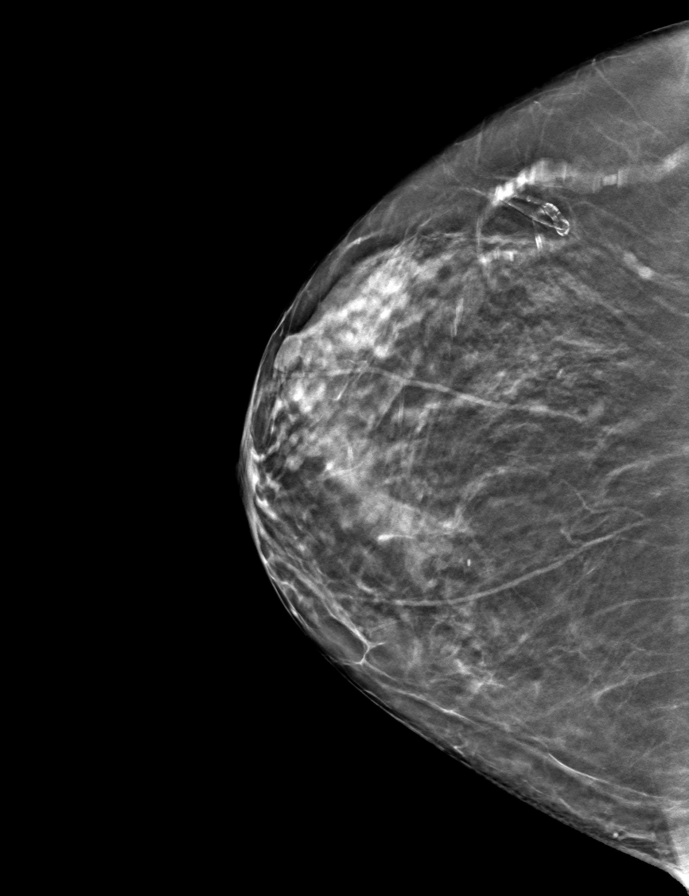
[frame 33/65]
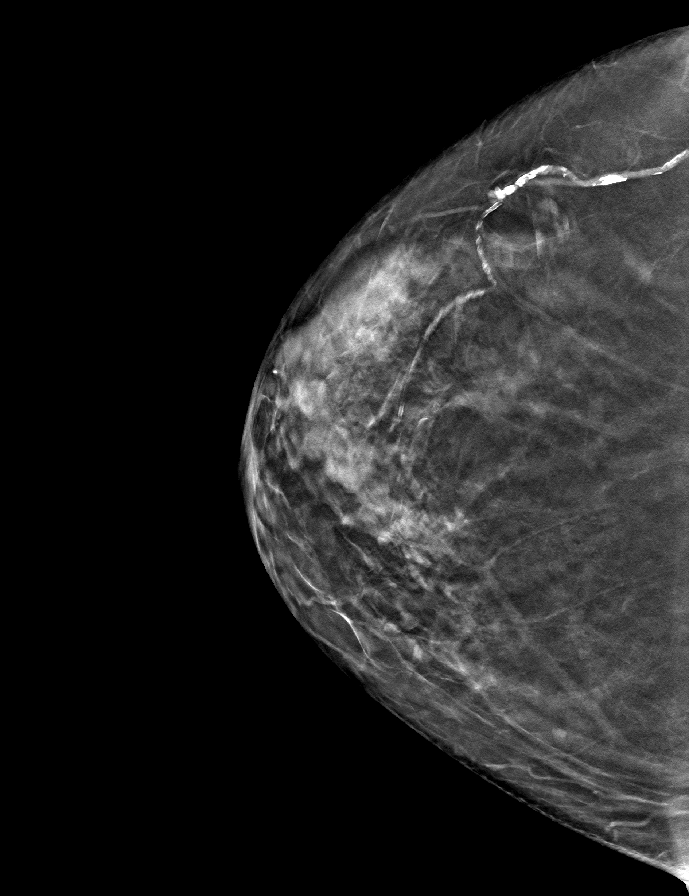

[L MLO tomo · tomo slice 33/65.0]
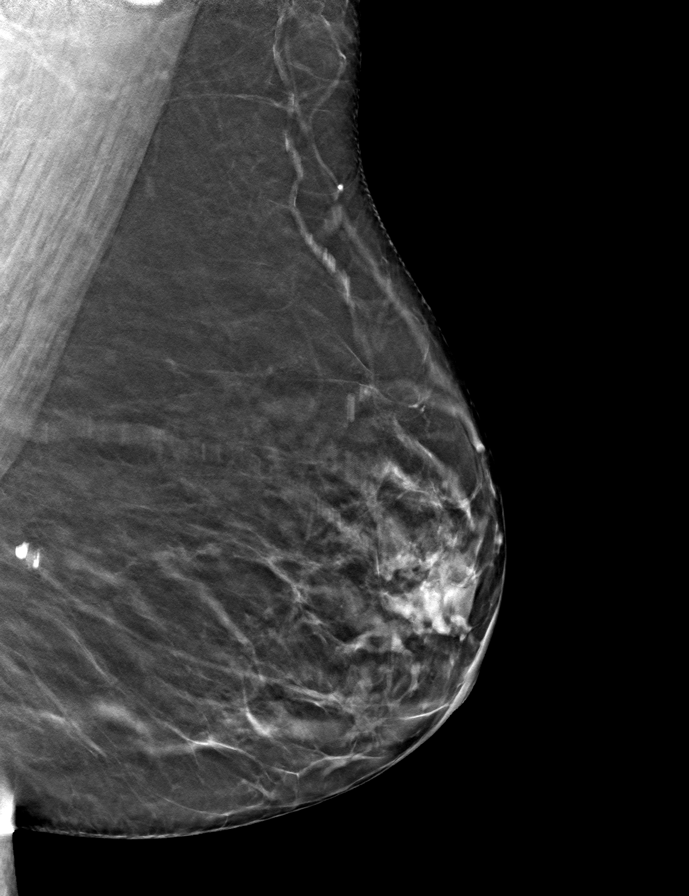

[L CC tomo · tomo slice 31/61.0]
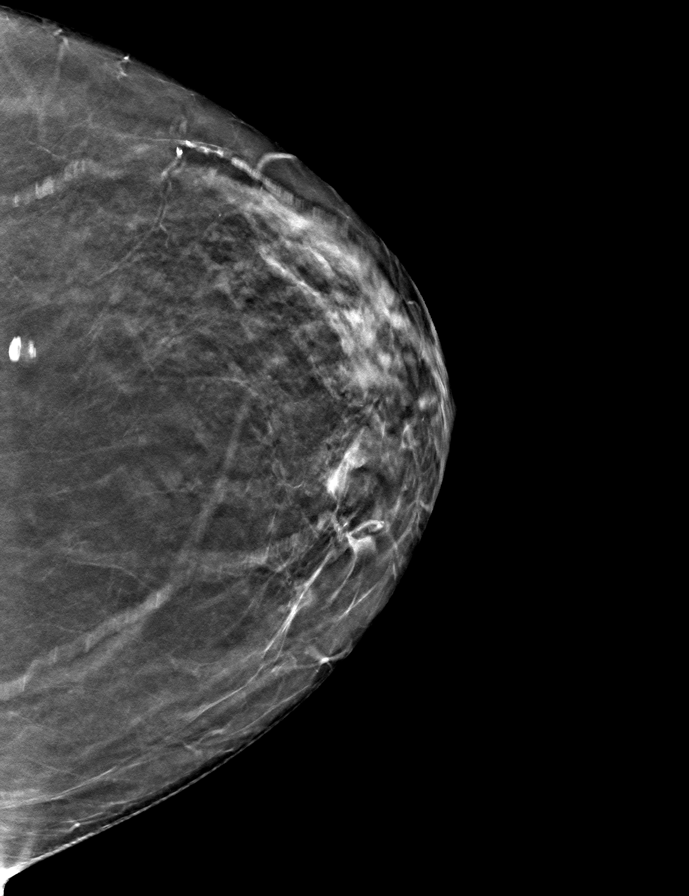

[R MLO tomo · tomo slice 31/61.0]
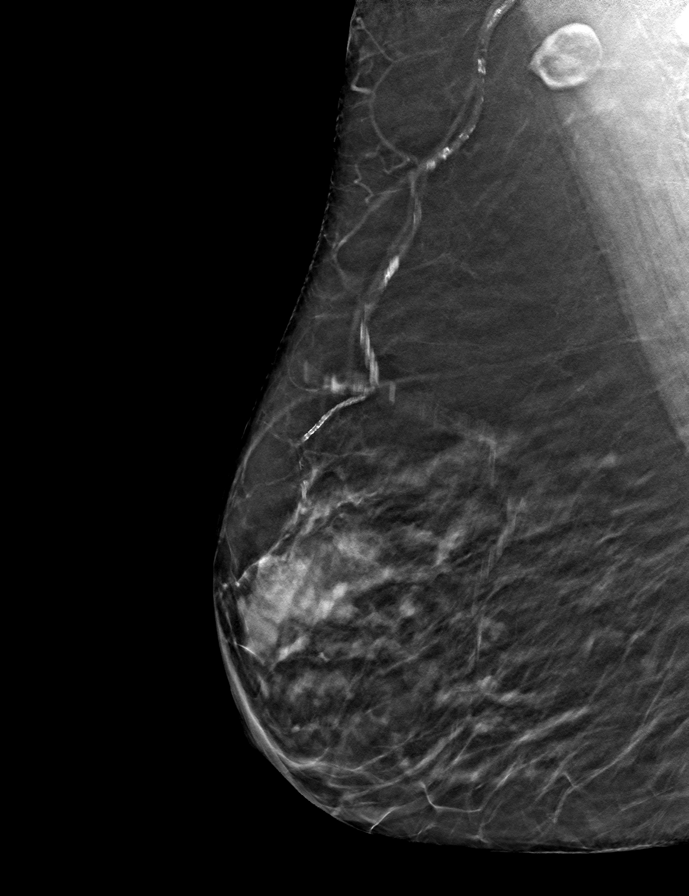

[9 of 24 positions shown; findings below may reference images not displayed]

ACR Breast Density Category c: The breast tissue is heterogeneously
dense, which may obscure small masses.
FINDINGS: There are no findings suspicious for malignancy. The images were
evaluated with computer-aided detection.
IMPRESSION: No mammographic evidence of malignancy. A result letter of this
screening mammogram will be mailed directly to the patient.

RECOMMENDATION:
Screening mammogram in one year. (Code:[id number])

BI-RADS CATEGORY  1: Negative.

## 2023-01-14 ENCOUNTER — Telehealth: Payer: Self-pay | Admitting: Pharmacist

## 2023-01-14 NOTE — Progress Notes (Signed)
Care Management & Coordination Services Pharmacy Team  Reason for Encounter: Hypertension  Contacted patient to discuss hypertension disease state. Spoke with patient on 01/14/2023     Current antihypertensive regimen:  Amlodipine 10 mg daily Carvedilol 25 mg twice daily Irbesartan 300 mg daily Spironolactone 12.5 mg daily  Patient verbally confirms she is taking the above medications as directed. Yes  How often are you checking your Blood Pressure? daily  she checks her blood pressure in the middle of the day after taking her medication.  Current home BP readings: 130-135/60-70   Wrist or arm cuff: arm Salt intake: limits salt intake  Any readings above 180/100? No If yes any symptoms of hypertensive emergency? patient denies any symptoms of high blood pressure  What recent interventions/DTPs have been made by any provider to improve Blood Pressure control since last CPP Visit: No recent interventions or DTPs.  Any recent hospitalizations or ED visits since last visit with CPP? No  What diet changes have been made to improve Blood Pressure Control?  No recent diet changes.  What exercise is being done to improve your Blood Pressure Control?  Patient cleans houses for a living.  Adherence Review: Is the patient currently on ACE/ARB medication? Yes Does the patient have >5 day gap between last estimated fill dates? No  Star Rating Drugs:  Irbesartan 300 mg last filled 12/23/2022 90 DS   Chart Updates: Recent office visits:  11/12/2022 OV (PCP) Marin Olp, MD; no medication changes indicated.  Recent consult visits:  11/06/2022 OV (Oncology) [name], MD; no medication changes indicated.  10/13/2022 OV (Cardiology) Elgie Collard, PA-C; no medication changes indicated.  Hospital visits:  None in previous 6 months  Medications: Outpatient Encounter Medications as of 01/14/2023  Medication Sig   acetaminophen (TYLENOL) 325 MG tablet Take 650 mg by  mouth every 6 (six) hours as needed for mild pain, moderate pain or headache.   albuterol (VENTOLIN HFA) 108 (90 Base) MCG/ACT inhaler Inhale 2 puffs into the lungs every 6 (six) hours as needed for wheezing or shortness of breath.   ALPRAZolam (XANAX) 1 MG tablet Take 1 tablet (1 mg total) by mouth at bedtime. DO NOT DRIVE FOR 8 HOURS AFTER TAKING   amLODipine (NORVASC) 10 MG tablet Take 1 tablet (10 mg total) by mouth daily.   anastrozole (ARIMIDEX) 1 MG tablet TAKE ONE TABLET DAILY   aspirin EC 81 MG tablet Take 1 tablet (81 mg total) by mouth daily. Swallow whole.   Bempedoic Acid-Ezetimibe (NEXLIZET) 180-10 MG TABS Take 180 mg by mouth daily.   budesonide-formoterol (SYMBICORT) 160-4.5 MCG/ACT inhaler Inhale 2 puffs into the lungs 2 (two) times daily.   carvedilol (COREG) 25 MG tablet Take 1 tablet (25 mg total) by mouth 2 (two) times daily with a meal.   famotidine (PEPCID) 20 MG tablet TAKE ONE TABLET TWICE DAILY   irbesartan (AVAPRO) 300 MG tablet Take 1 tablet (300 mg total) by mouth daily.   levocetirizine (XYZAL) 5 MG tablet Take 1 tablet (5 mg total) by mouth every morning.   omega-3 acid ethyl esters (LOVAZA) 1 g capsule TAKE TWO CAPSULES EVERY DAY   ondansetron (ZOFRAN-ODT) 8 MG disintegrating tablet Take 1 tablet (8 mg total) by mouth every 8 (eight) hours as needed for nausea or vomiting.   spironolactone (ALDACTONE) 25 MG tablet Take 0.5 tablets (12.5 mg total) by mouth daily.   venlafaxine XR (EFFEXOR-XR) 75 MG 24 hr capsule TAKE ONE CAPSULE EACH DAY WITH BREAKFAST  No facility-administered encounter medications on file as of 01/14/2023.    Recent Office Vitals: BP Readings from Last 3 Encounters:  11/12/22 132/78  11/06/22 (!) 142/82  10/13/22 (!) 144/78   Pulse Readings from Last 3 Encounters:  11/12/22 71  11/06/22 66  10/13/22 (!) 50    Wt Readings from Last 3 Encounters:  11/12/22 163 lb (73.9 kg)  11/06/22 165 lb 6.4 oz (75 kg)  10/13/22 163 lb 9.6 oz (74.2  kg)     Kidney Function Lab Results  Component Value Date/Time   CREATININE 1.03 11/12/2022 04:37 PM   CREATININE 1.02 (H) 09/23/2022 11:55 AM   CREATININE 0.90 01/07/2022 09:02 AM   CREATININE 0.89 08/07/2020 08:45 AM   CREATININE 0.94 (H) 10/20/2016 09:34 AM   GFR 52.86 (L) 11/12/2022 04:37 PM   GFRNONAA >60 01/07/2022 09:02 AM   GFRNONAA 64 08/07/2020 08:45 AM   GFRAA 74 08/07/2020 08:45 AM       Latest Ref Rng & Units 11/12/2022    4:37 PM 09/23/2022   11:55 AM 08/05/2022    1:52 PM  BMP  Glucose 70 - 99 mg/dL 108  128  144   BUN 6 - 23 mg/dL 25  30  30    Creatinine 0.40 - 1.20 mg/dL 1.03  1.02  0.88   BUN/Creat Ratio 12 - 28  29  34   Sodium 135 - 145 mEq/L 142  142  142   Potassium 3.5 - 5.1 mEq/L 4.1  5.1  4.4   Chloride 96 - 112 mEq/L 101  102  100   CO2 19 - 32 mEq/L 27  26  27    Calcium 8.4 - 10.5 mg/dL 10.0  10.1  9.7     Future Appointments  Date Time Provider [place]  01/15/2023  8:20 AM [name], MD [facility]  01/26/2023 11:00 AM [facility] PHARMACIST [facility]  02/05/2023 12:00 PM Edythe Clarity, [place] [facility] None  05/07/2023  9:30 AM [name], MD [facility] None  05/13/2023  3:20 PM Marin Olp, MD [facility]  06/30/2023  1:30 PM [facility] VASC 3 [facility]  10/08/2023  9:35 AM [facility] [place] ECHO 1 [id number]   April D Calhoun, [place] Pharmacist Assistant [phone]

## 2023-01-15 ENCOUNTER — Ambulatory Visit: Payer: Medicare Other | Attending: Cardiology | Admitting: Cardiology

## 2023-01-15 ENCOUNTER — Encounter: Payer: Self-pay | Admitting: Cardiology

## 2023-01-15 VITALS — BP 126/88 | HR 60 | Ht 60.0 in | Wt 161.6 lb

## 2023-01-15 DIAGNOSIS — I25119 Atherosclerotic heart disease of native coronary artery with unspecified angina pectoris: Secondary | ICD-10-CM | POA: Diagnosis not present

## 2023-01-15 DIAGNOSIS — I340 Nonrheumatic mitral (valve) insufficiency: Secondary | ICD-10-CM

## 2023-01-15 DIAGNOSIS — R0602 Shortness of breath: Secondary | ICD-10-CM

## 2023-01-15 DIAGNOSIS — I5032 Chronic diastolic (congestive) heart failure: Secondary | ICD-10-CM

## 2023-01-15 DIAGNOSIS — I10 Essential (primary) hypertension: Secondary | ICD-10-CM

## 2023-01-15 DIAGNOSIS — I6529 Occlusion and stenosis of unspecified carotid artery: Secondary | ICD-10-CM

## 2023-01-15 DIAGNOSIS — I350 Nonrheumatic aortic (valve) stenosis: Secondary | ICD-10-CM | POA: Diagnosis not present

## 2023-01-15 DIAGNOSIS — E669 Obesity, unspecified: Secondary | ICD-10-CM

## 2023-01-15 NOTE — Patient Instructions (Signed)
Medication Instructions:   Your physician recommends that you continue on your current medications as directed. Please refer to the Current Medication list given to you today.  *If you need a refill on your cardiac medications before your next appointment, please call your pharmacy*    Testing/Procedures:  Your physician has requested that you have a carotid duplex. This test is an ultrasound of the carotid arteries in your neck. It looks at blood flow through these arteries that supply the brain with blood. Allow one hour for this exam. There are no restrictions or special instructions.  SCHEDULE TO BE DONE IN SEPTEMBER 2024 PER DR. Johney Frame    Follow-Up: At [facility], you and your health needs are our priority.  As part of our continuing mission to provide you with exceptional heart care, we have created designated Provider Care Teams.  These Care Teams include your primary Cardiologist (physician) and Advanced Practice Providers (APPs -  Physician Assistants and Nurse Practitioners) who all work together to provide you with the care you need, when you need it.  We recommend signing up for the patient portal called "MyChart".  Sign up information is provided on this After Visit Summary.  MyChart is used to connect with patients for Virtual Visits (Telemedicine).  Patients are able to view lab/test results, encounter notes, upcoming appointments, etc.  Non-urgent messages can be sent to your provider as well.   To learn more about what you can do with MyChart, go to [email or web address].    Your next appointment:   6 month(s)  Provider:   [name], MD

## 2023-01-18 ENCOUNTER — Telehealth: Payer: Self-pay | Admitting: Emergency Medicine

## 2023-01-18 ENCOUNTER — Encounter: Payer: Self-pay | Admitting: Emergency Medicine

## 2023-01-18 NOTE — Telephone Encounter (Signed)
MTG-015 - Tissue and Bodily Fluids: Translational Medicine: Discovery and Evaluation of Biomarkers/Pharmacogenomics for the Diagnosis and Personalized Management of Patients   MT 3505-A: 12 MONTH FOLLOW UP CALL   Contacted patient today per protocol for 12 month follow up call.  Confirmed patient identity using two identifiers, verified privacy.   Patient denies any relapse, progression, or new cancer since last Research visit.   Reviewed medications and allergies.  No changes in medications in last 30 days.  Anastrozole started 02/12/22 for DCIS treatment (anti estrogen oral tx).  Acetaminophen started in 2015 for PRN use for pain management.  Spironolactone started and carvedilol increased from 12.5 mg daily to 25 mg daily on 07/29/22 for CHF management.  Reviewed and clarified medical history with patient: - Hyperlipidemia- started Nexlizet and Lovaza in 2020. - Nausea- Zofran prescribed 08/06/22 for PRN use - Osteopenia diagnosed 08/18/22- no medications/supplements for it at this time - CHF diagnosed 07/29/22- started spironolactone and increased carvedilol  Patient denies any questions or concerns at this time, aware to follow up as needed.  Patient's 50$ gift card for completion of study requirements will be mailed out to address on file.  Wells Guiles 'Learta Codding' Neysa Bonito, RN, BSN, [facility] Clinical Research Nurse I 01/18/23 10:34 AM

## 2023-01-26 ENCOUNTER — Ambulatory Visit: Payer: Medicare Other

## 2023-02-03 ENCOUNTER — Telehealth: Payer: Self-pay | Admitting: Family Medicine

## 2023-02-03 NOTE — Telephone Encounter (Signed)
Copied from CRM #[id number]. Topic: Medicare AWV >> Feb 03, 2023 12:38 PM Christianjames Soule C wrote: Reason for CRM: Called patient to schedule Medicare Annual Wellness Visit (AWV). Left message for patient to call back and schedule Medicare Annual Wellness Visit (AWV).  Last date of AWV: 09/29/2021  Please schedule an appointment at any time with Tina, NHA. Please schedule AWVS with Tina, N[facility] [place].  If any questions, please contact me at [phone].  Thank you ,  Bowyn Mercier E Debbe Crumble [facility] AWV TEAM Direct Dial [phone] 

## 2023-02-03 NOTE — Telephone Encounter (Signed)
Copied from CRM [id number]. Topic: Medicare AWV >> Feb 03, 2023 12:38 PM Gwenith Spitz wrote: Reason for CRM: Called patient to schedule Medicare Annual Wellness Visit (AWV). Left message for patient to call back and schedule Medicare Annual Wellness Visit (AWV).  Last date of AWV: 09/29/2021  Please schedule an appointment at any time with Inetta Fermo, [facility]. Please schedule AWVS with Inetta Fermo, N[facility] [place].  If any questions, please contact me at [phone].  Thank you ,  Gabriel Cirri [facility] AWV TEAM Direct Dial [phone]

## 2023-02-05 ENCOUNTER — Ambulatory Visit: Payer: Medicare Other | Admitting: Pharmacist

## 2023-02-05 NOTE — Progress Notes (Signed)
Care Management & Coordination Services Pharmacy Note  02/05/2023 Name:  Felicia Perry MRN:  [id number] DOB:  [birth year]  Summary: PharmD FU visit.  Patient got DEXA in October which showed very borderling osteopenia with T-score of -2.4.  She had not started any treatment at this time.  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  She feels good overall and medications are up to date and adherence 100%.  Recommendations/Changes made from today's visit: Recommended she start calcium and vitamin D for recent osteopenia result.  Keep scheduled DEXAs every 2 years. Patient agrees to call and schedule her AWV within the next week.  Follow up plan: FU 6 months   Subjective: Felicia Perry is an 77 y.o. year old female who is a primary patient of Durene Cal, Aldine Contes, MD.  The care coordination team was consulted for assistance with disease management and care coordination needs.    Engaged with patient by telephone for follow up visit.  Recent consult visits:  11/06/2022 OV (Oncology) Rachel Moulds, MD; no medication changes indicated.   10/13/2022 OV (Cardiology) Sharlene Dory, PA-C; no medication changes indicated.   Hospital visits:  None in previous 6 months   Objective:  Lab Results  Component Value Date   CREATININE 1.03 11/12/2022   BUN 25 (H) 11/12/2022   GFR 52.86 (L) 11/12/2022   EGFR 57 (L) 09/23/2022   GFRNONAA >60 01/07/2022   GFRAA 74 08/07/2020   NA 142 11/12/2022   K 4.1 11/12/2022   CALCIUM 10.0 11/12/2022   CO2 27 11/12/2022   GLUCOSE 108 (H) 11/12/2022    Lab Results  Component Value Date/Time   HGBA1C 6.8 (H) 11/12/2022 04:37 PM   HGBA1C 6.4 04/08/2022 11:07 AM   GFR 52.86 (L) 11/12/2022 04:37 PM   GFR 71.89 04/08/2022 11:07 AM   MICROALBUR 1.9 11/12/2022 04:37 PM    Last diabetic Eye exam:  Lab Results  Component Value Date/Time   HMDIABEYEEXA No Retinopathy 12/25/2021 12:00 AM    Last diabetic  Foot exam: No results found for: "HMDIABFOOTEX"   Lab Results  Component Value Date   CHOL 120 11/12/2022   HDL 32.30 (L) 11/12/2022   LDLCALC 48 08/07/2020   LDLDIRECT 58.0 11/12/2022   TRIG 294.0 (H) 11/12/2022   CHOLHDL 4 11/12/2022       Latest Ref Rng & Units 11/12/2022    4:37 PM 04/08/2022   11:07 AM 01/07/2022    9:02 AM  Hepatic Function  Total Protein 6.0 - 8.3 g/dL 7.7  7.7  7.5   Albumin 3.5 - 5.2 g/dL 4.7  4.5  4.3   AST 0 - 37 U/L 22  40  13   ALT 0 - 35 U/L 20  33  15   Alk Phosphatase 39 - 117 U/L 59  52  76   Total Bilirubin 0.2 - 1.2 mg/dL 0.6  1.0  0.9     Lab Results  Component Value Date/Time   TSH 1.38 08/07/2020 08:45 AM   TSH 1.37 10/20/2016 09:34 AM       Latest Ref Rng & Units 11/12/2022    4:37 PM 09/23/2022   11:55 AM 04/08/2022   11:07 AM  CBC  WBC 4.0 - 10.5 K/uL 10.4  9.0  8.7   Hemoglobin 12.0 - 15.0 g/dL 78.2  95.6  21.3   Hematocrit 36.0 - 46.0 % 42.0  41.1  42.0   Platelets 150.0 -  400.0 K/uL 334.0  311  313.0     Lab Results  Component Value Date/Time   VD25OH 29.03 (L) 11/12/2022 04:37 PM   VD25OH 45.47 08/12/2021 11:46 AM   VITAMINB12 937 08/07/2020 08:45 AM   VITAMINB12 742 07/25/2019 09:17 AM    Clinical ASCVD: No  The ASCVD Risk score (Arnett DK, et al., 2019) failed to calculate for the following reasons:   The valid total cholesterol range is 130 to 320 mg/dL        7/41/6384    5:36 PM 09/29/2021    8:56 AM 08/12/2021   10:44 AM  Depression screen PHQ 2/9  Decreased Interest 0 0 0  Down, Depressed, Hopeless 1 0 0  PHQ - 2 Score 1 0 0  Altered sleeping 0    Tired, decreased energy 0    Change in appetite 0    Feeling bad or failure about yourself  0    Trouble concentrating 0    Moving slowly or fidgety/restless 0    Suicidal thoughts 0    PHQ-9 Score 1    Difficult doing work/chores Not difficult at all       Social History   Tobacco Use  Smoking Status Former   Packs/day: 1.00   Years: 26.00    Additional pack years: 0.00   Total pack years: 26.00   Types: Cigarettes   Quit date: 1989   Years since quitting: 35.3  Smokeless Tobacco Never   BP Readings from Last 3 Encounters:  01/15/23 126/88  11/12/22 132/78  11/06/22 (!) 142/82   Pulse Readings from Last 3 Encounters:  01/15/23 60  11/12/22 71  11/06/22 66   Wt Readings from Last 3 Encounters:  01/15/23 161 lb 9.6 oz (73.3 kg)  11/12/22 163 lb (73.9 kg)  11/06/22 165 lb 6.4 oz (75 kg)   BMI Readings from Last 3 Encounters:  01/15/23 31.56 kg/m  11/12/22 31.83 kg/m  11/06/22 32.30 kg/m    Allergies  Allergen Reactions   Penicillins Anaphylaxis    REACTION: per patient causes rash,hives   Cephalosporins Swelling    REACTION: tongue swelling   Citalopram Hydrobromide Other (See Comments)    psychosis   Rosuvastatin Other (See Comments)    Pt reports causes bilateral lower extremity muscle aches.    Zolpidem Tartrate     REACTION: difficulty with concentration   Clarithromycin Rash    REACTION: blisters in mouth   Levofloxacin Rash    REACTION: tongue swells    Medications Reviewed Today     Reviewed by Erroll Luna, [facility] (Pharmacist) on 02/05/23 at 1342  Med List Status: <None>   Medication Order Taking? Sig Documenting Provider Last Dose Status Informant  acetaminophen (TYLENOL) 325 MG tablet [id number] No Take 650 mg by mouth every 6 (six) hours as needed for mild pain, moderate pain or headache. [provider] Taking Active Self  albuterol (VENTOLIN HFA) 108 (90 Base) MCG/ACT inhaler [id number] No Inhale 2 puffs into the lungs every 6 (six) hours as needed for wheezing or shortness of breath. Shelva Majestic, MD Taking Active Self  ALPRAZolam Prudy Feeler) 1 MG tablet [id number] No Take 1 tablet (1 mg total) by mouth at bedtime. DO NOT DRIVE FOR 8 HOURS AFTER TAKING Shelva Majestic, MD Taking Active   amLODipine (NORVASC) 10 MG tablet [id number] No Take 1 tablet (10 mg total) by mouth  daily. Meriam Sprague, MD Taking Active Self  anastrozole (ARIMIDEX) 1 MG tablet [id number] No  TAKE ONE TABLET DAILY Rachel Moulds, MD Taking Active   aspirin EC 81 MG tablet [id number] No Take 1 tablet (81 mg total) by mouth daily. Swallow whole. Tereso Newcomer T, PA-C Taking Active Self  Bempedoic Acid-Ezetimibe (NEXLIZET) 180-10 MG TABS [id number] No Take 180 mg by mouth daily. Meriam Sprague, MD Taking Active   budesonide-formoterol [facility]) 160-4.5 MCG/ACT inhaler [id number] No Inhale 2 puffs into the lungs 2 (two) times daily. Shelva Majestic, MD Taking Active Self  carvedilol (COREG) 25 MG tablet [id number] No Take 1 tablet (25 mg total) by mouth 2 (two) times daily with a meal. Meriam Sprague, MD Taking Active Self  famotidine (PEPCID) 20 MG tablet [id number] No TAKE ONE TABLET TWICE DAILY Shelva Majestic, MD Taking Active Self  irbesartan (AVAPRO) 300 MG tablet [id number] No Take 1 tablet (300 mg total) by mouth daily. Meriam Sprague, MD Taking Active Self  levocetirizine (XYZAL) 5 MG tablet [id number] No Take 1 tablet (5 mg total) by mouth every morning. Shelva Majestic, MD Taking Active Self  omega-3 acid ethyl esters (LOVAZA) 1 g capsule [id number] No TAKE TWO CAPSULES EVERY DAY Meriam Sprague, MD Taking Active Self  ondansetron (ZOFRAN-ODT) 8 MG disintegrating tablet [id number] No Take 1 tablet (8 mg total) by mouth every 8 (eight) hours as needed for nausea or vomiting. Loa Socks, NP Taking Active Self  spironolactone (ALDACTONE) 25 MG tablet [id number] No Take 0.5 tablets (12.5 mg total) by mouth daily. Meriam Sprague, MD Taking Active Self  venlafaxine XR (EFFEXOR-XR) 75 MG 24 hr capsule [id number] No TAKE ONE CAPSULE EACH DAY WITH BREAKFAST Judi Saa, DO Taking Active             SDOH:  (Social Determinants of Health) assessments and interventions performed: Yes Financial Resource Strain: Low Risk  (01/07/2022)   Overall  Financial Resource Strain (CARDIA)    Difficulty of Paying Living Expenses: Not very hard   Food Insecurity: No Food Insecurity (01/07/2022)   Hunger Vital Sign    Worried About Running Out of Food in the Last Year: Never true    Ran Out of Food in the Last Year: Never true    SDOH Interventions    Flowsheet Row Office Visit from 11/12/2022 in [facility] Clinical Support from 01[facility]07/2022 in [facility] Office Visit from 08[facility]07/2020 in [facility] Office Visit from 01[facility]23/2020 in [facility] from 04/12/2018 in [facility] from 02/01/2018 in [facility]  SDOH Interventions        Food Insecurity Interventions -- Intervention Not Indicated -- -- -- --  Housing Interventions -- Intervention Not Indicated -- -- -- --  Transportation Interventions -- Intervention Not Indicated -- -- -- --  Depression Interventions/Treatment  Medication -- Medication Medication, Counseling Medication Medication, Currently on Treatment  Financial Strain Interventions -- Intervention Not Indicated -- -- -- --       Medication Assistance: None required.  Patient affirms current coverage meets needs.  Medication Access: Within the past 30 days, how often has patient missed a dose of medication? 0 Is a pillbox or other method used to improve adherence? Yes  Factors that may affect medication adherence? no barriers identified Are meds synced by current pharmacy? No  Are meds delivered by current pharmacy? No  Does patient experience delays in picking up medications due to transportation concerns? No  [facility] Reviewed: Is patient disadvantaged to use [facility]?: Yes  Current Rx insurance plan: [employer] Name and location of Current pharmacy:  [facility] - [place], [place] - [address]  [place] [address] Phone: [phone] Fax: [phone]  [facility] services reviewed with patient today?: Yes  Patient requests to transfer care to [facility]?: No  Reason patient declined to change pharmacies: Disadvantaged due to insurance/mail order  Compliance/Adherence/Medication fill history: Care Gaps: AWV due - patient agrees to call to schedule this ASAP  Star-Rating Drugs: Irbesartan 300 mg last filled 12/23/2022 90 DS    Assessment/Plan                   Hypertension (BP goal <130/80) 08/04/22 -Controlled -Current treatment: Irbesartan 300mg  daily Appropriate, Effective, Safe, Accessible Amlodipine 10mg  daily Appropriate, Effective, Safe, Accessible Carvedilol 12.5mg  BID Appropriate, Effective, Safe, Accessible Spironolactone 25mg  one-half tablet daily Appropriate, Query effective, ,  -Medications previously tried: metoprolol, nadolol, hydralazine -Current home readings: no logs discussed --Remains active, her last few office Bps have been well controlled.   Reports home BP has been normal.  Denies any headaches or dizziness No changes to medications at this time.   History -Denies hypotensive/hypertensive symptoms -Educated on BP goals and benefits of medications for prevention of heart attack, stroke and kidney damage; Exercise goal of 150 minutes per week; Importance of home blood pressure monitoring; Symptoms of hypotension and importance of maintaining adequate hydration; -She was prescribed spironolactone recently after visit with cardiology but she has no been to pick up the medication yet.  Counseled on medication and benefits on BP. -She attributes increase to when she started anastrazole which is certainly a possibility. Plan to start spironolactone and assess efficacy before any changes.   Update 01/27/22 Recently had irbesartan dose cut in half due to bump in Creatinine.  Once she returned for CMP her kidney function  has normalized.  She has continued on 150mg  daily and her BP at home has been all < 130/80. She denies any dizziness or HA.  She was concerned on if she needed to increase back to the 300mg . Will consult with PCP - if BP is where it is at home I would recommend staying at the lower dose for now.  Lifestyle mods and weight loss will continue to improve BP as well.  Hyperlipidemia: (LDL goal < 70) -Not ideally controlled, not assessed -Current treatment: Nexlizet 180-10mg  daily Lovaza 1g two capsules daily -Medications previously tried: statins (myalgais)  -Current dietary patterns: see DM -Current exercise habits: see HTN -Educated on Cholesterol goals;  Importance of limiting foods high in cholesterol; Exercise goal of 150 minutes per week; -Recommended to continue current medication LDL close to goal < 70. Increased exercise and diet changes should improve.  Getting through [employer], was recently just approved.  Will be getting through this for the next year.  Diabetes (A1c goal <7%) -Uncontrolled, not assessed -Current medications: None at this time -Medications previously tried: none noted  -Current home glucose readings fasting glucose: not checking post prandial glucose: not checking -Denies hypoglycemic/hyperglycemic symptoms -Current meal patterns: she was eating meals high in carbohydrates.  Some days she cooks and some days she will get take out depending on how busy she is.  Currently living at her home with son and his fiance.  They will pick up fast food for her occasionally. drinks: water -Current exercise: minimal - [employer] a few times  weekly -Educated on A1c and blood sugar goals; Exercise goal of 150 minutes per week; Benefits of weight loss; Carbohydrate counting and/or plate method -Counseled to check feet daily and get yearly eye exams -Continue current management, lots of time spent today discussing diet (implications of carbs in blood sugar).   She really wants to fix this with diet and exercise.  Encouraged her this was possible with lifestyle mods.  Will follow closely to keep her on track. If meds needed in future would recommend Farxiga due to heart benefits.  Update 01/27/22 Most recent A1c decreased to 7.0!  She has made great lifestyle changes - has not had soda in > 6 months.  She is eating less carbs and sweets, doing more cooking at home instead of eating out and fast food. She reports about 9 lbs of weight loss. She plans to continue these lifestyle mods - she feels better and is starting to enjoy eating this type of food. Continue current management and routine screenings. Will continue to follow and keep on track.  Heart Failure (Goal: manage symptoms and prevent exacerbations) -Controlled, not assessed -Last ejection fraction: >65% -HF type: Diastolic -Current treatment: Carvedilol 12.5mg  BID -Medications previously tried: metoprolol  -Current home BP/HR readings: normal -Current dietary habits: see DM -Current exercise habits: see DM -Educated on Benefits of medications for managing symptoms and prolonging life Importance of weighing daily; if you gain more than 3 pounds in one day or 5 pounds in one week, contact providers Importance of blood pressure control -Counseled on diet and exercise extensively Recommended to continue current medication Denies any swelling  Depression/Anxiety (Goal: Minimize symptoms) -Not ideally controlled, not assessed -Current treatment: Venlafaxine ER 75mg  daily -Medications previously tried/failed: Wellbutrin, Celexa -PHQ9:  PHQ9 SCORE ONLY 08/12/2021 02/05/2021 08/16/2020  PHQ-9 Total Score 0 6 0  -GAD7: No flowsheet data found.  -Educated on Benefits of medication for symptom control Need to be in right mental frame of mind in order to take care of herself and make lifestyle mods.  Patient admits today that she may be a little depressed.  She does not feel like Effexor  is working for her anymore and does not want to increase dose.  She reports stopping this med once and had horrible symptoms but she did not have anything else to replace it.  Based on her history the only ones she has not tried would be Lexapro, Zoloft, and Cymbalta. 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 -Will consult with PCP on next steps to treat depression, I feel if we can get this under control se may be more likely to make lifestyle mods to improve DM.  Breast Cancer (Goal: Continued remission) -Controlled, not assessed -Current treatment  Anastrazole 1 mg daily Appropriate, Effective, Safe, Accessible -Medications previously tried: none noted  -Started medication in December and noticed her BP creeping up ever since.  This certainly could be related to this medication but need to weigh the pros and cons of this medication.She also has a DEXA ordered which I would encourage because this medication comes with increased fracture risk. Order already placed just needs to schedule.  Osteopenia (Goal Maintain bone density/prevent fracture) 02/05/2023 -Controlled -Last DEXA Scan: October 2023    T-Score forearm radius: -2.4  10-year probability of major osteoporotic fracture: 10.4%  10-year probability of hip fracture: 1.8% -Patient is not a candidate for pharmacologic treatment -Current treatment  None -Medications previously tried: none noted  -Recommend [phone] units of vitamin D daily. Recommend 1200 mg  of calcium daily from dietary and supplemental sources. -Recommended patient pick up these OTC meds.  Reminded her to stay up to date on her DEXA scans.  Patient agreeable to every two year plan.  Stay active and do weight bearing exercise to help strengthen bones.   Patient Goals/Self-Care Activities Patient will:  - take medications as prescribed as evidenced by patient report and record review focus on medication adherence by own home system target a minimum of 150 minutes of  moderate intensity exercise weekly Work to limit carbohydrates in diet              Willa Frater, PharmD Clinical Pharmacist  [facility] [phone]

## 2023-03-04 NOTE — Progress Notes (Signed)
[facility] [address] [place] [address] Phone: [phone] Subjective:   Felicia Perry, am serving as a scribe for Dr. Antoine Perry.  I'm seeing this patient by the request  of:  Felicia Majestic, MD  CC: Multiple joint complaints  WGN:[id number]  Felicia Perry is a 77 y.o. female coming in with complaint of B Perry and lumbar spine pain. Last seen Sept 2023. Patient states that she has fallen 2x since last visit. States that her dizziness has gotten worse since last fall. She did suffer a LOC. Eyes to be more blurry as well. Spoke with Felicia Perry in January about fall. Wonders if Effexor is still helping her or not.   Also going through stress with son and girlfriend living in house.   Knees have been doing well.        Past Medical History:  Diagnosis Date   Adenomatous polyp 11/23/2005   Anxiety    01/09/22- patient denies anxiety   Asthma 2003   Basal cell carcinoma 2021   right cheek per patient at [facility]   Carotid stenosis    Chronic diastolic CHF (congestive heart failure) (HCC) 2017   Echo 2021 was normal   COPD (chronic obstructive pulmonary disease) (HCC)    Coronary artery disease 2017   a. HC on 08/14/16 showed RCA CTO s/p PCI, 60% LCX disease managed medically with recs to consider staged PCI if continued symptoms.    Depression 2008   pt reports MD diagnosed but she does not feel depressed   Diabetes mellitus without complication (HCC)    DIVERTICULOSIS, COLON 04/22/2007        GERD (gastroesophageal reflux disease) 2012   History of radiation therapy    Right breast- 03/04/22-04/02/22- Dr. Antony Blackbird   History of shingles    Hypertension 2008   Hypertensive heart disease    Insomnia 2003   Internal hemorrhoids    Osteoarthritis 2008   "knees, hands, lower back" (08/14/2016)   Pap smear for cervical cancer screening 2002   results normal   Personal history of radiation therapy    PUD  (peptic ulcer disease) 1989   Seasonal allergies 2013   Sleep apnea    a. resolved with weight loss   Past Surgical History:  Procedure Laterality Date   BREAST BIOPSY Right 12/30/2021   BREAST EXCISIONAL BIOPSY Left    1980's x 3 - all benign   BREAST EXCISIONAL BIOPSY Right    1980's x 1 or 2 - all benign   BREAST LUMPECTOMY Right 01/22/2022   BREAST LUMPECTOMY WITH RADIOACTIVE SEED LOCALIZATION Right 01/22/2022   Procedure: RIGHT BREAST LUMPECTOMY WITH RADIOACTIVE SEED LOCALIZATION;  Surgeon: Harriette Bouillon, MD;  Location: [facility];  Service: General;  Laterality: Right;   BREAST SURGERY Left 1980s   Fibrous Tumors removed    CARDIAC CATHETERIZATION N/A 08/14/2016   Procedure: Left Heart Cath and Coronary Angiography;  Surgeon: Tonny Bollman, MD;  Location: [facility] INVASIVE CV LAB;  Service: Cardiovascular;  Laterality: N/A;   CARDIAC CATHETERIZATION N/A 08/14/2016   Procedure: Coronary Stent Intervention;  Surgeon: Tonny Bollman, MD;  Location: [facility] INVASIVE CV LAB;  Service: Cardiovascular;  Laterality: N/A;   CATARACT EXTRACTION W/ INTRAOCULAR LENS  IMPLANT, BILATERAL Bilateral 05/2009   COLONOSCOPY N/A 2016   Cologuard 2019   CORONARY ANGIOPLASTY     detached retina left eye     may 2019   LEFT HEART CATH AND  CORONARY ANGIOGRAPHY N/A 09/25/2022   Procedure: LEFT HEART CATH AND CORONARY ANGIOGRAPHY;  Surgeon: Kathleene Hazel, MD;  Location: [facility] INVASIVE CV LAB;  Service: Cardiovascular;  Laterality: N/A;   TUBAL LIGATION  1970s   Social History   Socioeconomic History   Marital status: Divorced    Spouse name: Not on file   Number of children: Not on file   Years of education: Not on file   Highest education level: Not on file  Occupational History   Occupation: Cleaning  Tobacco Use   Smoking status: Former    Packs/day: 1.00    Years: 26.00    Additional pack years: 0.00    Total pack years: 26.00    Types: Cigarettes    Quit date: 28     Years since quitting: 35.3   Smokeless tobacco: Never  Vaping Use   Vaping Use: Never used  Substance and Sexual Activity   Alcohol use: No   Drug use: No   Sexual activity: Not Currently  Other Topics Concern   Not on file  Social History Narrative   Divorced for 38 years in 2016. 2 sons- 1 son had been in prison now living with her in 2019.  2 granddaughters with 1 living close.        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 Best friend Felicia Perry patient of Felicia Perry. Enjoys work, time on Animator.    Social Determinants of Health   Financial Resource Strain: Low Risk  (01/07/2022)   Overall Financial Resource Strain (CARDIA)    Difficulty of Paying Living Expenses: Not very hard  Food Insecurity: No Food Insecurity (01/07/2022)   Hunger Vital Sign    Worried About Running Out of Food in the Last Year: Never true    Ran Out of Food in the Last Year: Never true  Transportation Needs: No Transportation Needs (01/07/2022)   PRAPARE - Administrator, Civil Service (Medical): No    Lack of Transportation (Non-Medical): No  Physical Activity: Sufficiently Active (09/29/2021)   Exercise Vital Sign    Days of Exercise per Week: 2 days    Minutes of Exercise per Session: 120 min  Stress: Stress Concern Present (09/29/2021)   [name] of Occupational Health - Occupational Stress Questionnaire    Feeling of Stress : To some extent  Social Connections: Moderately Isolated (09/29/2021)   Social Connection and Isolation Panel [NHANES]    Frequency of Communication with Friends and Family: More than three times a week    Frequency of Social Gatherings with Friends and Family: More than three times a week    Attends Religious Services: More than 4 times per year    Active Member of [employer] or Organizations: No    Attends Tax inspector Meetings: Never    Marital Status: Divorced   Allergies  Allergen Reactions   Penicillins Anaphylaxis    REACTION: per patient causes rash,hives   Cephalosporins Swelling    REACTION: tongue swelling   Citalopram Hydrobromide Other (See Comments)    psychosis   Rosuvastatin Other (See Comments)    Pt reports causes bilateral lower extremity muscle aches.    Zolpidem Tartrate     REACTION: difficulty with concentration   Clarithromycin Rash    REACTION: blisters in mouth   Levofloxacin  Rash    REACTION: tongue swells   Family History  Problem Relation Age of Onset   Heart disease Mother    Hypertension Mother    Diabetes Mother    Dementia Mother    COPD Father    Dementia Maternal Grandmother    Colon cancer Neg Hx      Current Outpatient Medications (Cardiovascular):    amLODipine (NORVASC) 10 MG tablet, Take 1 tablet (10 mg total) by mouth daily.   Bempedoic Acid-Ezetimibe (NEXLIZET) 180-10 MG TABS, Take 180 mg by mouth daily.   carvedilol (COREG) 25 MG tablet, Take 1 tablet (25 mg total) by mouth 2 (two) times daily with a meal.   irbesartan (AVAPRO) 300 MG tablet, Take 1 tablet (300 mg total) by mouth daily.   omega-3 acid ethyl esters (LOVAZA) 1 g capsule, TAKE TWO CAPSULES EVERY DAY   spironolactone (ALDACTONE) 25 MG tablet, Take 0.5 tablets (12.5 mg total) by mouth daily.  Current Outpatient Medications (Respiratory):    albuterol (VENTOLIN HFA) 108 (90 Base) MCG/ACT inhaler, Inhale 2 puffs into the lungs every 6 (six) hours as needed for wheezing or shortness of breath.   budesonide-formoterol (SYMBICORT) 160-4.5 MCG/ACT inhaler, Inhale 2 puffs into the lungs 2 (two) times daily.   levocetirizine (XYZAL) 5 MG tablet, Take 1 tablet (5 mg total) by mouth every morning.  Current Outpatient Medications (Analgesics):    acetaminophen (TYLENOL) 325 MG tablet, Take 650 mg by mouth every 6 (six) hours as needed for mild pain, moderate pain or headache.    aspirin EC 81 MG tablet, Take 1 tablet (81 mg total) by mouth daily. Swallow whole.   Current Outpatient Medications (Other):    ALPRAZolam (XANAX) 1 MG tablet, Take 1 tablet (1 mg total) by mouth at bedtime. DO NOT DRIVE FOR 8 HOURS AFTER TAKING   anastrozole (ARIMIDEX) 1 MG tablet, TAKE ONE TABLET DAILY   famotidine (PEPCID) 20 MG tablet, TAKE ONE TABLET TWICE DAILY   ondansetron (ZOFRAN-ODT) 8 MG disintegrating tablet, Take 1 tablet (8 mg total) by mouth every 8 (eight) hours as needed for nausea or vomiting.   venlafaxine XR (EFFEXOR-XR) 75 MG 24 hr capsule, TAKE ONE CAPSULE EACH DAY WITH BREAKFAST   Reviewed prior external information including notes and imaging from  primary care provider As well as notes that were available from care everywhere and other healthcare systems.  Past medical history, social, surgical and family history all reviewed in electronic medical record.  No pertanent information unless stated regarding to the chief complaint.   Review of Systems:  No headache, visual changes, nausea, vomiting, diarrhea, constipation, dizziness, abdominal pain, skin rash, fevers, chills, night sweats, weight loss, swollen lymph nodes, body aches, joint swelling, chest pain, shortness of breath, mood changes. POSITIVE muscle aches, body aches  Objective  Blood pressure 118/82, pulse 72, height 5' (1.524 m), weight 160 lb (72.6 kg), SpO2 97 %.   General: alert and oriented x3 mood and affect normal, dressed appropriately.  Patient is highly stressed. HEENT: Pupils equal, extraocular movements intact  Respiratory: Patient's speak in full sentences and does not appear short of breath  Cardiovascular: No lower extremity edema, non tender, no erythema  Patient does have an antalgic gait noted.  Patient does have tenderness to palpation noted.  Seems to be in the paraspinal musculature.  Patient does have some limited sidebending of the neck bilaterally. Neck exam does have some  limited range of motion.  Seems to be near patient's baseline but  does have some difficulty with extension    Impression and Recommendations:    The above documentation has been reviewed and is accurate and complete Judi Saa, DO

## 2023-03-09 ENCOUNTER — Ambulatory Visit: Payer: Medicare Other | Admitting: Family Medicine

## 2023-03-09 VITALS — BP 118/82 | HR 72 | Ht 60.0 in | Wt 160.0 lb

## 2023-03-09 DIAGNOSIS — M5030 Other cervical disc degeneration, unspecified cervical region: Secondary | ICD-10-CM | POA: Diagnosis not present

## 2023-03-09 DIAGNOSIS — E559 Vitamin D deficiency, unspecified: Secondary | ICD-10-CM | POA: Diagnosis not present

## 2023-03-09 DIAGNOSIS — R2689 Other abnormalities of gait and mobility: Secondary | ICD-10-CM

## 2023-03-09 DIAGNOSIS — R42 Dizziness and giddiness: Secondary | ICD-10-CM

## 2023-03-09 NOTE — Assessment & Plan Note (Signed)
We discussed the possibility of osteopathic manipulation but feels like she is more concerned with the balance and coordination difficulties she is having.  Patient has been worked up relatively completely for her vertigo-like symptoms.  We discussed with patient that we will get patient into physical therapy that I think will be helpful for the balance and coordination.  Total time with patient today 31 minutes.  Patient does have significant number of life stressors that is likely playing a role including some difficulties with her family.

## 2023-03-09 NOTE — Assessment & Plan Note (Signed)
Refill vitamin D  We will do the once weekly with patient having difficulty finding it on a regular basis.

## 2023-03-09 NOTE — Patient Instructions (Signed)
PT balance and coordination PT Vestibular neuro I do not think anything will get better until home enviro is fixed See me in 6-8 weeks

## 2023-03-10 ENCOUNTER — Ambulatory Visit: Payer: Medicare Other | Attending: Family Medicine | Admitting: Physical Therapy

## 2023-03-10 ENCOUNTER — Other Ambulatory Visit: Payer: Self-pay

## 2023-03-10 ENCOUNTER — Encounter: Payer: Self-pay | Admitting: Physical Therapy

## 2023-03-10 ENCOUNTER — Telehealth: Payer: Self-pay | Admitting: Physical Therapy

## 2023-03-10 DIAGNOSIS — R2681 Unsteadiness on feet: Secondary | ICD-10-CM | POA: Insufficient documentation

## 2023-03-10 DIAGNOSIS — R2689 Other abnormalities of gait and mobility: Secondary | ICD-10-CM | POA: Diagnosis not present

## 2023-03-10 DIAGNOSIS — R42 Dizziness and giddiness: Secondary | ICD-10-CM

## 2023-03-10 NOTE — Therapy (Signed)
OUTPATIENT PHYSICAL THERAPY VESTIBULAR EVALUATION     Patient Name: Felicia Perry MRN: [id number] DOB:1945/10/31, 77 y.o., female Today's Date: 03/10/2023  END OF SESSION:  PT End of Session - 03/10/23 1714     Visit Number 1    Number of Visits 13    Date for PT Re-Evaluation 04/21/23    Authorization Type BCBS Medicare    PT Start Time 1619    PT Stop Time 1703    PT Time Calculation (min) 44 min    Activity Tolerance Patient tolerated treatment well    Behavior During Therapy [place] for tasks assessed/performed             Past Medical History:  Diagnosis Date   Adenomatous polyp 11/23/2005   Anxiety    01/09/22- patient denies anxiety   Asthma 2003   Basal cell carcinoma 2021   right cheek per patient at [facility] [facility]   Carotid stenosis    Chronic diastolic CHF (congestive heart failure) (HCC) 2017   Echo 2021 was normal   COPD (chronic obstructive pulmonary disease) (HCC)    Coronary artery disease 2017   a. HC on 08/14/16 showed RCA CTO s/p PCI, 60% LCX disease managed medically with recs to consider staged PCI if continued symptoms.    Depression 2008   pt reports MD diagnosed but she does not feel depressed   Diabetes mellitus without complication (HCC)    DIVERTICULOSIS, COLON 04/22/2007        GERD (gastroesophageal reflux disease) 2012   History of radiation therapy    Right breast- 03/04/22-04/02/22- Dr. Antony Blackbird   History of shingles    Hypertension 2008   Hypertensive heart disease    Insomnia 2003   Internal hemorrhoids    Osteoarthritis 2008   "knees, hands, lower back" (08/14/2016)   Pap smear for cervical cancer screening 2002   results normal   Personal history of radiation therapy    PUD (peptic ulcer disease) 1989   Seasonal allergies 2013   Sleep apnea    a. resolved with weight loss   Past Surgical History:  Procedure Laterality Date   BREAST BIOPSY Right 12/30/2021   BREAST EXCISIONAL BIOPSY Left    1980's x 3 - all  benign   BREAST EXCISIONAL BIOPSY Right    1980's x 1 or 2 - all benign   BREAST LUMPECTOMY Right 01/22/2022   BREAST LUMPECTOMY WITH RADIOACTIVE SEED LOCALIZATION Right 01/22/2022   Procedure: RIGHT BREAST LUMPECTOMY WITH RADIOACTIVE SEED LOCALIZATION;  Surgeon: Harriette Bouillon, MD;  Location: [facility];  Service: General;  Laterality: Right;   BREAST SURGERY Left 1980s   Fibrous Tumors removed    CARDIAC CATHETERIZATION N/A 08/14/2016   Procedure: Left Heart Cath and Coronary Angiography;  Surgeon: Tonny Bollman, MD;  Location: [facility] INVASIVE CV LAB;  Service: Cardiovascular;  Laterality: N/A;   CARDIAC CATHETERIZATION N/A 08/14/2016   Procedure: Coronary Stent Intervention;  Surgeon: Tonny Bollman, MD;  Location: [facility] INVASIVE CV LAB;  Service: Cardiovascular;  Laterality: N/A;   CATARACT EXTRACTION W/ INTRAOCULAR LENS  IMPLANT, BILATERAL Bilateral 05/2009   COLONOSCOPY N/A 2016   Cologuard 2019   CORONARY ANGIOPLASTY     detached retina left eye     may 2019   LEFT HEART CATH AND CORONARY ANGIOGRAPHY N/A 09/25/2022   Procedure: LEFT HEART CATH AND CORONARY ANGIOGRAPHY;  Surgeon: Kathleene Hazel, MD;  Location: [facility] INVASIVE CV LAB;  Service: Cardiovascular;  Laterality: N/A;   TUBAL  LIGATION  1970s   Patient Active Problem List   Diagnosis Date Noted   Shortness of breath 09/25/2022   Genetic testing 01/19/2022   Ductal carcinoma in situ (DCIS) of right breast 01/05/2022   Diabetes mellitus type 2 with complications (HCC) 02/05/2021   Intractable headache 01/02/2021   Greater trochanteric bursitis of left hip 08/13/2020   Senile purpura (HCC) 08/07/2020   statin myalgia 06/25/2020   Carotid stenosis 01/23/2020   Chronic diastolic CHF (congestive heart failure) (HCC) 01/12/2019   Degenerative arthritis of knee, bilateral 12/08/2018   Partial hamstring tear, initial encounter 11/16/2018   Hyperlipidemia, unspecified 07/11/2018   Nonallopathic lesion of sacral  region 07/05/2018   Nonallopathic lesion of cervical region 07/05/2018   DDD (degenerative disc disease), cervical 05/04/2018   Degenerative arthritis of left knee 09/15/2017   H/O hyperparathyroidism 08/18/2016   Coronary artery disease involving native coronary artery of native heart without angina pectoris    PUD (peptic ulcer disease)    Syncope 08/04/2016   Vitamin D deficiency 08/20/2015   Hyperglycemia 08/06/2015   Xerosis of skin 08/06/2015   Hypersomnia with sleep apnea 03/26/2015   Chronic pain syndrome 03/26/2015   Nonallopathic lesion of lumbosacral region 11/13/2014   Osteoarthritis of left lower extremity 10/17/2014   Chronic meniscal tear of knee 10/17/2014   Nonallopathic lesion of thoracic region 09/14/2014   Nonallopathic lesion-rib cage 08/21/2014   Tendinopathy of right rotator cuff 07/30/2014   Piriformis syndrome of right side 07/30/2014   Insomnia 07/13/2014   Limited joint range of motion 07/13/2014   Rash 07/13/2014   GERD (gastroesophageal reflux disease) 02/25/2011   SI (sacroiliac) joint dysfunction 10/10/2007   LOW BACK PAIN 07/01/2007   Depression 04/22/2007   Essential hypertension 04/22/2007   Asthma 04/22/2007    PCP: Shelva Majestic, MD REFERRING PROVIDER: Judi Saa, DO  REFERRING DIAG: R42 (ICD-10-CM) - Dizziness R26.89 (ICD-10-CM) - Balance disorder   THERAPY DIAG:  Dizziness and giddiness  Unsteadiness on feet  ONSET DATE: 3-4 weeks   Rationale for Evaluation and Treatment: Rehabilitation  SUBJECTIVE:   SUBJECTIVE STATEMENT: Patient reports that dizziness started after she had falls. First fall occurred when she got up to go to the restroom and did not turn a light on- twisted and hit the back of her neck. Dizziness started after this fall. Also reports HAs for the first few days, but dizziness continued. Episodes are described as spinning and last minutes. Worse with rolling in bed, laying down in bed, sitting out of  bed. Also reports new spells of "I can't stop blinking" and new photophobia. Had a 2nd fall going down the stairs backwards, and hitting head again. 3rd fall occurred 10 days later in a similar scenario but this fall was much harder- reports "I still have lumps on my head." Had nausea in next 24 hours, otherwise symptoms unchanged. Denies infection/illness, vision changes/double vision, hearing loss, tinnitus, migraines. Reports mild soreness over the back of the neck. Denies N/T or radiation.   Pt accompanied by: self  PERTINENT HISTORY: Anxiety, asthma, CHF, CAD, depression, DM, HTN, breast CA  PAIN:  Are you having pain? Yes: NPRS scale: 3/10 Pain location: L frontal headache Pain description: headache, dull Aggravating factors: light Relieving factors: tylenol  PRECAUTIONS: Fall  WEIGHT BEARING RESTRICTIONS: No  FALLS: Has patient fallen in last 6 months? Yes. 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   PATIENT GOALS: improve dizziness  OBJECTIVE:   DIAGNOSTIC FINDINGS: none recent (pt did not have imaging after her falls)  COGNITION: Overall cognitive status: Within functional limits for tasks assessed   SENSATION: Report L lateral 2 toes N/T occasionally, otherwise intact    Cervical ROM:    Active AROM (deg) eval  Flexion 35  Extension 54  Right lateral flexion 25 * pain over L cervical paraspinals   Left lateral flexion 25 * pain over L cervical paraspinals   Right rotation 54  Left rotation 57 *c/o tight  (Blank rows = not tested)  GAIT: Gait pattern: guarded and slightly unsteady  Assistive device utilized: None Level of assistance: SBA   PATIENT SURVEYS:  FOTO 52, 64  VESTIBULAR  ASSESSMENT:  GENERAL OBSERVATION: pt wears reading glasses   OCULOMOTOR EXAM:  Ocular Alignment: normal  Ocular ROM: No Limitations  Spontaneous Nystagmus: absent  Gaze-Induced Nystagmus: absent  Smooth Pursuits:  several saccades vertically, a couple horizontally   Saccades: intact  Convergence/Divergence: 4 cm limited by L eye  VESTIBULAR - OCULAR REFLEX:   Slow VOR: difficulty focusing and c/o feeling unsteady horizontal, c/o neck discomfort vertical  VOR Cancellation: Normal; c/o dizziness  Head-Impulse Test: HIT Right: negative HIT Left: negative *this test caused an episode of retropulsion d/t pt feeling unsteady   POSITIONAL TESTING:  Right Roll Test: negative, dizziness upon sitting up Left Roll Test: negative, dizziness upon sitting up Right Dix-Hallpike: negative Left Dix-Hallpike: negative, dizziness upon sitting up Right Sidelying: negative Left Sidelying: negative, dizziness upon sitting up   VESTIBULAR TREATMENT:                                                                                                   DATE: 03/10/23    PATIENT EDUCATION: Education details: prognosis, POC, edu on BPPV and concussion Person educated: Patient Education method: Explanation, Demonstration, Tactile cues, and Verbal cues Education comprehension: verbalized understanding and returned demonstration  HOME EXERCISE PROGRAM:   GOALS: Goals reviewed with patient? Yes  SHORT TERM GOALS: Target date: 03/31/2023  Patient to be independent with initial HEP. Baseline: HEP initiated Goal status: INITIAL    LONG TERM GOALS: Target date: 04/21/2023  Patient to be independent with advanced HEP. Baseline: Not yet initiated  Goal status: INITIAL  Patient to report 0/10 dizziness with standing vertical and horizontal VOR for 30 seconds. Baseline: Unable Goal status: INITIAL  Patient will report 0/10 dizziness with bed mobility.  Baseline: Symptomatic  Goal status:  INITIAL  Patient to score at least 20/24 on DGI in order to decrease risk of falls. Baseline: NT Goal status: INITIAL  Patient to score at least 52 on FOTO in order to indicate improved functional outcomes.  Baseline: 64 Goal status: INITIAL  Patient to report no falls in the past 4 weeks. Baseline: - Goal status: INITIAL     ASSESSMENT:  CLINICAL IMPRESSION:   Patient is  a 77 y/o F presenting to OPPT with c/o dizziness for the past 3-4 weeks after a fall with head trauma. Reports 2 other falls since then. Reports new spells of "I can't stop blinking" and new photophobia as well as neck pain without N/T or radiation.  Denies infection/illness, vision changes/double vision, hearing loss, tinnitus, migraines. Exam today revealed Limited cervical ROM with pain, gait deviations, saccades with smooth pursuits, dizziness and retropulsion with horizontal VOR and HIT, dizziness with VOR cancellation, and motion sensitivity from L side.  Would benefit from skilled PT services 1-2x/week for 6 weeks to address aforementioned impairments in order to optimize level of function.    OBJECTIVE IMPAIRMENTS: Abnormal gait, decreased activity tolerance, decreased balance, decreased ROM, dizziness, and pain.   ACTIVITY LIMITATIONS: carrying, lifting, bending, sitting, standing, squatting, sleeping, stairs, transfers, bed mobility, bathing, dressing, reach over head, hygiene/grooming, and locomotion level  PARTICIPATION LIMITATIONS: meal prep, cleaning, laundry, driving, shopping, community activity, occupation, and church  PERSONAL FACTORS: Age, Past/current experiences, Time since onset of injury/illness/exacerbation, and 3+ comorbidities: Anxiety, asthma, CHF, CAD, depression, DM, HTN, breast CA  are also affecting patient's functional outcome.   REHAB POTENTIAL: Good  CLINICAL DECISION MAKING: Evolving/moderate complexity  EVALUATION COMPLEXITY: Moderate   PLAN:  PT FREQUENCY: 1-2x/week  PT  DURATION: 6 weeks  PLANNED INTERVENTIONS: Therapeutic exercises, Therapeutic activity, Neuromuscular re-education, Balance training, Gait training, Patient/Family education, Self Care, Joint mobilization, Stair training, Vestibular training, Canalith repositioning, DME instructions, Aquatic Therapy, Dry Needling, Electrical stimulation, Cryotherapy, Moist heat, Taping, Manual therapy, and Re-evaluation  PLAN FOR NEXT SESSION: reassess positional testing, initiate HEP for habituation, balance, DGI    Anette Guarneri, PT, DPT 03/10/23 5:31 PM  [facility] [address] [place], [place] [address] Phone # [phone] Fax # [phone]

## 2023-03-10 NOTE — Telephone Encounter (Signed)
Hi Dr. Katrinka Blazing,  I evaluated [name] in OPPT for dizziness today per your referral. She presented with some abnormal oculomotor testing including: saccades with vertical and horizontal smooth pursuits. Believe she likely sustained a concussion from the 3 falls she reports which resulted in head trauma in the past 4 weeks. She has not had imaging. Wanted to make you aware. Please advise if any changes to POC.   Thanks!  Anette Guarneri, PT, DPT

## 2023-03-11 ENCOUNTER — Other Ambulatory Visit: Payer: Self-pay

## 2023-03-11 DIAGNOSIS — R42 Dizziness and giddiness: Secondary | ICD-10-CM

## 2023-03-11 NOTE — Telephone Encounter (Signed)
MRI ordered. Left message with patient to call back to discuss plan.

## 2023-03-11 NOTE — Telephone Encounter (Signed)
Spoke with patient. She is going to call [facility] to schedule MRI.

## 2023-03-12 NOTE — Therapy (Signed)
OUTPATIENT PHYSICAL THERAPY VESTIBULAR TREATMENT     Patient Name: Felicia Perry MRN: [id number] DOB:11/09/1945, 77 y.o., female Today's Date: 03/17/2023  END OF SESSION:  PT End of Session - 03/17/23 0838     Visit Number 2    Number of Visits 13    Date for PT Re-Evaluation 04/21/23    Authorization Type BCBS Medicare    PT Start Time 0757    PT Stop Time 0841    PT Time Calculation (min) 44 min    Activity Tolerance Patient tolerated treatment well    Behavior During Therapy [facility] for tasks assessed/performed              Past Medical History:  Diagnosis Date   Adenomatous polyp 11/23/2005   Anxiety    01/09/22- patient denies anxiety   Asthma 2003   Basal cell carcinoma 2021   right cheek per patient at [place] [facility]   Carotid stenosis    Chronic diastolic CHF (congestive heart failure) (HCC) 2017   Echo 2021 was normal   COPD (chronic obstructive pulmonary disease) (HCC)    Coronary artery disease 2017   a. HC on 08/14/16 showed RCA CTO s/p PCI, 60% LCX disease managed medically with recs to consider staged PCI if continued symptoms.    Depression 2008   pt reports MD diagnosed but she does not feel depressed   Diabetes mellitus without complication (HCC)    DIVERTICULOSIS, COLON 04/22/2007        GERD (gastroesophageal reflux disease) 2012   History of radiation therapy    Right breast- 03/04/22-04/02/22- Dr. Antony Blackbird   History of shingles    Hypertension 2008   Hypertensive heart disease    Insomnia 2003   Internal hemorrhoids    Osteoarthritis 2008   "knees, hands, lower back" (08/14/2016)   Pap smear for cervical cancer screening 2002   results normal   Personal history of radiation therapy    PUD (peptic ulcer disease) 1989   Seasonal allergies 2013   Sleep apnea    a. resolved with weight loss   Past Surgical History:  Procedure Laterality Date   BREAST BIOPSY Right 12/30/2021   BREAST EXCISIONAL BIOPSY Left    1980's x 3 - all  benign   BREAST EXCISIONAL BIOPSY Right    1980's x 1 or 2 - all benign   BREAST LUMPECTOMY Right 01/22/2022   BREAST LUMPECTOMY WITH RADIOACTIVE SEED LOCALIZATION Right 01/22/2022   Procedure: RIGHT BREAST LUMPECTOMY WITH RADIOACTIVE SEED LOCALIZATION;  Surgeon: Harriette Bouillon, MD;  Location: [facility];  Service: General;  Laterality: Right;   BREAST SURGERY Left 1980s   Fibrous Tumors removed    CARDIAC CATHETERIZATION N/A 08/14/2016   Procedure: Left Heart Cath and Coronary Angiography;  Surgeon: Tonny Bollman, MD;  Location: [facility] INVASIVE CV LAB;  Service: Cardiovascular;  Laterality: N/A;   CARDIAC CATHETERIZATION N/A 08/14/2016   Procedure: Coronary Stent Intervention;  Surgeon: Tonny Bollman, MD;  Location: [facility] INVASIVE CV LAB;  Service: Cardiovascular;  Laterality: N/A;   CATARACT EXTRACTION W/ INTRAOCULAR LENS  IMPLANT, BILATERAL Bilateral 05/2009   COLONOSCOPY N/A 2016   Cologuard 2019   CORONARY ANGIOPLASTY     detached retina left eye     may 2019   LEFT HEART CATH AND CORONARY ANGIOGRAPHY N/A 09/25/2022   Procedure: LEFT HEART CATH AND CORONARY ANGIOGRAPHY;  Surgeon: Kathleene Hazel, MD;  Location: [facility] INVASIVE CV LAB;  Service: Cardiovascular;  Laterality: N/A;  TUBAL LIGATION  1970s   Patient Active Problem List   Diagnosis Date Noted   Shortness of breath 09/25/2022   Genetic testing 01/19/2022   Ductal carcinoma in situ (DCIS) of right breast 01/05/2022   Diabetes mellitus type 2 with complications (HCC) 02/05/2021   Intractable headache 01/02/2021   Greater trochanteric bursitis of left hip 08/13/2020   Senile purpura (HCC) 08/07/2020   statin myalgia 06/25/2020   Carotid stenosis 01/23/2020   Chronic diastolic CHF (congestive heart failure) (HCC) 01/12/2019   Degenerative arthritis of knee, bilateral 12/08/2018   Partial hamstring tear, initial encounter 11/16/2018   Hyperlipidemia, unspecified 07/11/2018   Nonallopathic lesion of sacral  region 07/05/2018   Nonallopathic lesion of cervical region 07/05/2018   DDD (degenerative disc disease), cervical 05/04/2018   Degenerative arthritis of left knee 09/15/2017   H/O hyperparathyroidism 08/18/2016   Coronary artery disease involving native coronary artery of native heart without angina pectoris    PUD (peptic ulcer disease)    Syncope 08/04/2016   Vitamin D deficiency 08/20/2015   Hyperglycemia 08/06/2015   Xerosis of skin 08/06/2015   Hypersomnia with sleep apnea 03/26/2015   Chronic pain syndrome 03/26/2015   Nonallopathic lesion of lumbosacral region 11/13/2014   Osteoarthritis of left lower extremity 10/17/2014   Chronic meniscal tear of knee 10/17/2014   Nonallopathic lesion of thoracic region 09/14/2014   Nonallopathic lesion-rib cage 08/21/2014   Tendinopathy of right rotator cuff 07/30/2014   Piriformis syndrome of right side 07/30/2014   Insomnia 07/13/2014   Limited joint range of motion 07/13/2014   Rash 07/13/2014   GERD (gastroesophageal reflux disease) 02/25/2011   SI (sacroiliac) joint dysfunction 10/10/2007   LOW BACK PAIN 07/01/2007   Depression 04/22/2007   Essential hypertension 04/22/2007   Asthma 04/22/2007    PCP: Shelva Majestic, MD REFERRING PROVIDER: Judi Saa, DO  REFERRING DIAG: R42 (ICD-10-CM) - Dizziness R26.89 (ICD-10-CM) - Balance disorder   THERAPY DIAG:  Dizziness and giddiness  Unsteadiness on feet  ONSET DATE: 3-4 weeks   Rationale for Evaluation and Treatment: Rehabilitation  SUBJECTIVE:   SUBJECTIVE STATEMENT: Doesn't notice the eyes blinking as much. Got an appointment for an MRI.   Pt accompanied by: self  PERTINENT HISTORY: Anxiety, asthma, CHF, CAD, depression, DM, HTN, breast CA  PAIN:  Are you having pain? Yes: NPRS scale: 7/10 Pain location: B posterolateral neck Pain description: stiff Aggravating factors: AM Relieving factors: tylenol  PRECAUTIONS: Fall  WEIGHT BEARING RESTRICTIONS:  No  FALLS: Has patient fallen in last 6 months? Yes. 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 Cues to avoid pushing into pain  sitting VOR cancellation 10x C/o neck pulling, no dizziness   sitting VOR horizontal 30" C/o blurred vision, mild dizziness   sitting VOR vertical 30"  C/o diplopia  HOME EXERCISE PROGRAM Last updated: 03/17/23 Access Code: [email or web address] URL: [email or web address] Date: 03/17/2023 Prepared by: [facility] - Outpatient  Rehab - [facility]  Exercises - Seated Shoulder Rolls  - 1 x daily - 5 x weekly - 2 sets - 10 reps - Seated Scapular Retraction  - 1 x daily - 5 x weekly - 2 sets - 10 reps - 3 sec hold - Seated Cervical Rotation AROM  - 1 x daily - 5 x weekly - 2 sets - 10 reps - Gentle Levator Scapulae Stretch  - 1 x daily - 5 x weekly - 2 sets - 30 sec hold - Seated Gaze Stabilization with Head Rotation  - 1 x daily - 5 x weekly - 2-3 sets - 30 sec hold - Seated Gaze Stabilization with Head Nod  - 1 x daily - 5 x weekly - 2 sets - 30 sec hold   PATIENT EDUCATION: Education details: edu on post-CRM expectations , HEP; edu on concussion recovery  Person educated: Patient Education method: Explanation, Demonstration, Tactile cues, Verbal cues, and  Handouts Education comprehension: verbalized understanding and returned demonstration    Below measures were taken at time of initial evaluation unless otherwise specified:   DIAGNOSTIC FINDINGS: none recent (pt did not have imaging after her falls)  COGNITION: Overall cognitive status: Within functional limits for tasks assessed   SENSATION: Report L lateral 2 toes N/T occasionally, otherwise intact    Cervical ROM:    Active AROM (deg) eval  Flexion 35  Extension 54  Right lateral flexion 25 * pain over L cervical paraspinals   Left lateral flexion 25 * pain over L cervical paraspinals   Right rotation 54  Left rotation 57 *c/o tight  (Blank rows = not tested)  GAIT: Gait pattern: guarded and slightly unsteady  Assistive device utilized: None Level of assistance: SBA   PATIENT SURVEYS:  FOTO 52, 64  VESTIBULAR ASSESSMENT:  GENERAL OBSERVATION: pt wears reading glasses   OCULOMOTOR EXAM:  Ocular Alignment: normal  Ocular ROM: No Limitations  Spontaneous Nystagmus: absent  Gaze-Induced Nystagmus: absent  Smooth Pursuits:  several saccades vertically, a couple horizontally   Saccades: intact  Convergence/Divergence: 4 cm limited by L eye  VESTIBULAR - OCULAR REFLEX:   Slow VOR: difficulty focusing and c/o feeling unsteady horizontal, c/o neck discomfort vertical  VOR Cancellation: Normal; c/o dizziness  Head-Impulse Test: HIT Right: negative HIT Left: negative *this test caused an episode of retropulsion d/t pt feeling unsteady   POSITIONAL TESTING:  Right Roll Test: negative, dizziness upon sitting up Left Roll Test: negative, dizziness upon sitting up Right Dix-Hallpike: negative Left Dix-Hallpike: negative, dizziness upon sitting up Right Sidelying: negative Left Sidelying: negative, dizziness upon sitting up   VESTIBULAR TREATMENT:                                                                                                   DATE:  03/10/23    PATIENT EDUCATION: Education details: prognosis, POC, edu on BPPV and concussion Person educated: Patient Education method: Explanation, Demonstration, Tactile cues, and Verbal cues Education  comprehension: verbalized understanding and returned demonstration  HOME EXERCISE PROGRAM:   GOALS: Goals reviewed with patient? Yes  SHORT TERM GOALS: Target date: 03/31/2023  Patient to be independent with initial HEP. Baseline: HEP initiated Goal status: IN PROGRESS    LONG TERM GOALS: Target date: 04/21/2023  Patient to be independent with advanced HEP. Baseline: Not yet initiated  Goal status: IN PROGRESS  Patient to report 0/10 dizziness with standing vertical and horizontal VOR for 30 seconds. Baseline: Unable Goal status: IN PROGRESS  Patient will report 0/10 dizziness with bed mobility.  Baseline: Symptomatic  Goal status: IN PROGRESS  Patient to score at least 20/24 on DGI in order to decrease risk of falls. Baseline: NT Goal status: IN PROGRESS  Patient to score at least 52 on FOTO in order to indicate improved functional outcomes.  Baseline: 64 Goal status: IN PROGRESS  Patient to report no falls in the past 4 weeks. Baseline: - Goal status: IN PROGRESS     ASSESSMENT:  CLINICAL IMPRESSION:  Patient arrived to session with report of some neck pain. Positional re-testing revealed R posterior canalithiasis, treated with R Epley which was tolerated well. Initiated gentle cervical ROM to address patient's pain complaints. Initiated HEP including gentle cervical stretches as well as VOR activities which was tolerated today. Educated provided on concussion recovery and balance between rest and activity and listening to her symptoms to maximize recovery. Patient tolerated session well.    OBJECTIVE IMPAIRMENTS: Abnormal gait, decreased activity tolerance, decreased balance, decreased ROM, dizziness, and pain.   ACTIVITY LIMITATIONS: carrying, lifting,  bending, sitting, standing, squatting, sleeping, stairs, transfers, bed mobility, bathing, dressing, reach over head, hygiene/grooming, and locomotion level  PARTICIPATION LIMITATIONS: meal prep, cleaning, laundry, driving, shopping, community activity, occupation, and church  PERSONAL FACTORS: Age, Past/current experiences, Time since onset of injury/illness/exacerbation, and 3+ comorbidities: Anxiety, asthma, CHF, CAD, depression, DM, HTN, breast CA  are also affecting patient's functional outcome.   REHAB POTENTIAL: Good  CLINICAL DECISION MAKING: Evolving/moderate complexity  EVALUATION COMPLEXITY: Moderate   PLAN:  PT FREQUENCY: 1-2x/week  PT DURATION: 6 weeks  PLANNED INTERVENTIONS: Therapeutic exercises, Therapeutic activity, Neuromuscular re-education, Balance training, Gait training, Patient/Family education, Self Care, Joint mobilization, Stair training, Vestibular training, Canalith repositioning, DME instructions, Aquatic Therapy, Dry Needling, Electrical stimulation, Cryotherapy, Moist heat, Taping, Manual therapy, and Re-evaluation  PLAN FOR NEXT SESSION: reassess R DH, review HEP for habituation, balance, DGI    Anette Guarneri, PT, DPT 03/17/23 8:43 AM  [facility] [address] [place], [place] [address] Phone # [phone] Fax # [phone]

## 2023-03-17 ENCOUNTER — Ambulatory Visit: Payer: Medicare Other | Admitting: Physical Therapy

## 2023-03-17 ENCOUNTER — Encounter: Payer: Self-pay | Admitting: Physical Therapy

## 2023-03-17 DIAGNOSIS — R42 Dizziness and giddiness: Secondary | ICD-10-CM

## 2023-03-17 DIAGNOSIS — R2681 Unsteadiness on feet: Secondary | ICD-10-CM | POA: Diagnosis not present

## 2023-03-17 DIAGNOSIS — R2689 Other abnormalities of gait and mobility: Secondary | ICD-10-CM | POA: Diagnosis not present

## 2023-03-18 ENCOUNTER — Other Ambulatory Visit: Payer: Self-pay | Admitting: Family Medicine

## 2023-03-23 ENCOUNTER — Ambulatory Visit: Payer: Medicare Other

## 2023-03-23 ENCOUNTER — Ambulatory Visit: Payer: Medicare Other | Admitting: Family Medicine

## 2023-03-23 DIAGNOSIS — R2681 Unsteadiness on feet: Secondary | ICD-10-CM | POA: Diagnosis not present

## 2023-03-23 DIAGNOSIS — R2689 Other abnormalities of gait and mobility: Secondary | ICD-10-CM | POA: Diagnosis not present

## 2023-03-23 DIAGNOSIS — R42 Dizziness and giddiness: Secondary | ICD-10-CM | POA: Diagnosis not present

## 2023-03-23 NOTE — Therapy (Signed)
OUTPATIENT PHYSICAL THERAPY VESTIBULAR TREATMENT     Patient Name: Felicia Perry MRN: [id number] DOB:02/12/1946, 77 y.o., female Today's Date: 03/23/2023  END OF SESSION:  PT End of Session - 03/23/23 1134     Visit Number 3    Number of Visits 13    Date for PT Re-Evaluation 04/21/23    Authorization Type BCBS Medicare    PT Start Time 1145    PT Stop Time 1230    PT Time Calculation (min) 45 min    Activity Tolerance Patient tolerated treatment well    Behavior During Therapy [facility] for tasks assessed/performed              Past Medical History:  Diagnosis Date   Adenomatous polyp 11/23/2005   Anxiety    01/09/22- patient denies anxiety   Asthma 2003   Basal cell carcinoma 2021   right cheek per patient at [place] [facility]   Carotid stenosis    Chronic diastolic CHF (congestive heart failure) (HCC) 2017   Echo 2021 was normal   COPD (chronic obstructive pulmonary disease) (HCC)    Coronary artery disease 2017   a. HC on 08/14/16 showed RCA CTO s/p PCI, 60% LCX disease managed medically with recs to consider staged PCI if continued symptoms.    Depression 2008   pt reports MD diagnosed but she does not feel depressed   Diabetes mellitus without complication (HCC)    DIVERTICULOSIS, COLON 04/22/2007        GERD (gastroesophageal reflux disease) 2012   History of radiation therapy    Right breast- 03/04/22-04/02/22- Dr. Antony Blackbird   History of shingles    Hypertension 2008   Hypertensive heart disease    Insomnia 2003   Internal hemorrhoids    Osteoarthritis 2008   "knees, hands, lower back" (08/14/2016)   Pap smear for cervical cancer screening 2002   results normal   Personal history of radiation therapy    PUD (peptic ulcer disease) 1989   Seasonal allergies 2013   Sleep apnea    a. resolved with weight loss   Past Surgical History:  Procedure Laterality Date   BREAST BIOPSY Right 12/30/2021   BREAST EXCISIONAL BIOPSY Left    1980's x 3 - all  benign   BREAST EXCISIONAL BIOPSY Right    1980's x 1 or 2 - all benign   BREAST LUMPECTOMY Right 01/22/2022   BREAST LUMPECTOMY WITH RADIOACTIVE SEED LOCALIZATION Right 01/22/2022   Procedure: RIGHT BREAST LUMPECTOMY WITH RADIOACTIVE SEED LOCALIZATION;  Surgeon: Harriette Bouillon, MD;  Location: [facility];  Service: General;  Laterality: Right;   BREAST SURGERY Left 1980s   Fibrous Tumors removed    CARDIAC CATHETERIZATION N/A 08/14/2016   Procedure: Left Heart Cath and Coronary Angiography;  Surgeon: Tonny Bollman, MD;  Location: [facility] INVASIVE CV LAB;  Service: Cardiovascular;  Laterality: N/A;   CARDIAC CATHETERIZATION N/A 08/14/2016   Procedure: Coronary Stent Intervention;  Surgeon: Tonny Bollman, MD;  Location: [facility] INVASIVE CV LAB;  Service: Cardiovascular;  Laterality: N/A;   CATARACT EXTRACTION W/ INTRAOCULAR LENS  IMPLANT, BILATERAL Bilateral 05/2009   COLONOSCOPY N/A 2016   Cologuard 2019   CORONARY ANGIOPLASTY     detached retina left eye     may 2019   LEFT HEART CATH AND CORONARY ANGIOGRAPHY N/A 09/25/2022   Procedure: LEFT HEART CATH AND CORONARY ANGIOGRAPHY;  Surgeon: Kathleene Hazel, MD;  Location: [facility] INVASIVE CV LAB;  Service: Cardiovascular;  Laterality: N/A;  TUBAL LIGATION  1970s   Patient Active Problem List   Diagnosis Date Noted   Shortness of breath 09/25/2022   Genetic testing 01/19/2022   Ductal carcinoma in situ (DCIS) of right breast 01/05/2022   Diabetes mellitus type 2 with complications (HCC) 02/05/2021   Intractable headache 01/02/2021   Greater trochanteric bursitis of left hip 08/13/2020   Senile purpura (HCC) 08/07/2020   statin myalgia 06/25/2020   Carotid stenosis 01/23/2020   Chronic diastolic CHF (congestive heart failure) (HCC) 01/12/2019   Degenerative arthritis of knee, bilateral 12/08/2018   Partial hamstring tear, initial encounter 11/16/2018   Hyperlipidemia, unspecified 07/11/2018   Nonallopathic lesion of sacral  region 07/05/2018   Nonallopathic lesion of cervical region 07/05/2018   DDD (degenerative disc disease), cervical 05/04/2018   Degenerative arthritis of left knee 09/15/2017   H/O hyperparathyroidism 08/18/2016   Coronary artery disease involving native coronary artery of native heart without angina pectoris    PUD (peptic ulcer disease)    Syncope 08/04/2016   Vitamin D deficiency 08/20/2015   Hyperglycemia 08/06/2015   Xerosis of skin 08/06/2015   Hypersomnia with sleep apnea 03/26/2015   Chronic pain syndrome 03/26/2015   Nonallopathic lesion of lumbosacral region 11/13/2014   Osteoarthritis of left lower extremity 10/17/2014   Chronic meniscal tear of knee 10/17/2014   Nonallopathic lesion of thoracic region 09/14/2014   Nonallopathic lesion-rib cage 08/21/2014   Tendinopathy of right rotator cuff 07/30/2014   Piriformis syndrome of right side 07/30/2014   Insomnia 07/13/2014   Limited joint range of motion 07/13/2014   Rash 07/13/2014   GERD (gastroesophageal reflux disease) 02/25/2011   SI (sacroiliac) joint dysfunction 10/10/2007   LOW BACK PAIN 07/01/2007   Depression 04/22/2007   Essential hypertension 04/22/2007   Asthma 04/22/2007    PCP: Shelva Majestic, MD REFERRING PROVIDER: Judi Saa, DO  REFERRING DIAG: R42 (ICD-10-CM) - Dizziness R26.89 (ICD-10-CM) - Balance disorder   THERAPY DIAG:  Dizziness and giddiness  Unsteadiness on feet  ONSET DATE: 3-4 weeks   Rationale for Evaluation and Treatment: Rehabilitation  SUBJECTIVE:   SUBJECTIVE STATEMENT: Notice feeling of woozy with position changes. Positional dizziness.  Pt accompanied by: self  PERTINENT HISTORY: Anxiety, asthma, CHF, CAD, depression, DM, HTN, breast CA  PAIN:  Are you having pain? Yes: NPRS scale: 7/10 Pain location: B posterolateral neck Pain description: stiff Aggravating factors: AM Relieving factors: tylenol  PRECAUTIONS: Fall  WEIGHT BEARING RESTRICTIONS:  No  FALLS: Has patient fallen in last 6 months? Yes. 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   PATIENT GOALS: improve dizziness  OBJECTIVE:   TODAY'S TREATMENT: 03/23/23 Activity Comments  R/L Roll test No nystagmus, no dizziness  Right Dix-Hallpike No dizziness/no nystagmus  Manual therapy Soft tissue mobilization cervical column to address subocciptal tenderness and levator trigger point left > right -joint mobilization to improve cervical rotation and lateral flexion  orthostatics Supine: 155/89 mmHg, 71 bpm Standing x 1 min: 154/43 mmHg, 80 bpm Standing x 3 min: 167/88 mmHg, 78 bpm  Seated gaze stabilization  Cues for decreased ROM and incr speed        TODAY'S TREATMENT: 03/17/23 Activity Comments  R/L roll test  negative  R DH C/o dizziness at initial  positioning, a few beats R upbeating torsional nystagmus   L DH negative  R Epley Tolerated well, noted some neck stiffness   Shoulder rolls forward/back 10x Scapular retraction 10x3" Cervical rotation to tolerance 10x R/L LS stretch 20" Demo and verbal cues for form. Cues to avoid pushing into pain  sitting VOR cancellation 10x C/o neck pulling, no dizziness   sitting VOR horizontal 30" C/o blurred vision, mild dizziness   sitting VOR vertical 30"  C/o diplopia     HOME EXERCISE PROGRAM Last updated: 03/17/23 Access Code: [email or web address] URL: [email or web address] Date: 03/17/2023 Prepared by: [facility] - Outpatient  Rehab - [facility]  Exercises - Seated Shoulder Rolls  - 1 x daily - 5 x weekly - 2 sets - 10 reps - Seated Scapular Retraction  - 1 x daily - 5 x weekly - 2 sets - 10 reps - 3 sec hold - Seated Cervical Rotation AROM  - 1 x  daily - 5 x weekly - 2 sets - 10 reps - Gentle Levator Scapulae Stretch  - 1 x daily - 5 x weekly - 2 sets - 30 sec hold - Seated Gaze Stabilization with Head Rotation  - 1 x daily - 5 x weekly - 2-3 sets - 30 sec hold - Seated Gaze Stabilization with Head Nod  - 1 x daily - 5 x weekly - 2 sets - 30 sec hold   PATIENT EDUCATION: Education details: edu on post-CRM expectations , HEP; edu on concussion recovery  Person educated: Patient Education method: Explanation, Demonstration, Tactile cues, Verbal cues, and Handouts Education comprehension: verbalized understanding and returned demonstration    Below measures were taken at time of initial evaluation unless otherwise specified:   DIAGNOSTIC FINDINGS: none recent (pt did not have imaging after her falls)  COGNITION: Overall cognitive status: Within functional limits for tasks assessed   SENSATION: Report L lateral 2 toes N/T occasionally, otherwise intact    Cervical ROM:    Active AROM (deg) eval  Flexion 35  Extension 54  Right lateral flexion 25 * pain over L cervical paraspinals   Left lateral flexion 25 * pain over L cervical paraspinals   Right rotation 54  Left rotation 57 *c/o tight  (Blank rows = not tested)  GAIT: Gait pattern: guarded and slightly unsteady  Assistive device utilized: None Level of assistance: SBA   PATIENT SURVEYS:  FOTO 52, 64  VESTIBULAR ASSESSMENT:  GENERAL OBSERVATION: pt wears reading glasses   OCULOMOTOR EXAM:  Ocular Alignment: normal  Ocular ROM: No Limitations  Spontaneous Nystagmus: absent  Gaze-Induced Nystagmus: absent  Smooth Pursuits:  several saccades vertically, a couple horizontally   Saccades: intact  Convergence/Divergence: 4 cm limited by L eye  VESTIBULAR - OCULAR REFLEX:   Slow VOR: difficulty focusing and c/o feeling unsteady horizontal, c/o neck discomfort vertical  VOR Cancellation: Normal; c/o dizziness  Head-Impulse Test: HIT Right: negative HIT  Left: negative *this test caused an episode of retropulsion d/t pt feeling unsteady   POSITIONAL TESTING:  Right Roll Test: negative, dizziness upon sitting up Left Roll Test: negative, dizziness upon sitting up Right Dix-Hallpike: negative Left Dix-Hallpike: negative, dizziness upon sitting up Right Sidelying: negative Left Sidelying: negative, dizziness upon sitting up   VESTIBULAR TREATMENT:  DATE: 03/10/23    PATIENT EDUCATION: Education details: prognosis, POC, edu on BPPV and concussion Person educated: Patient Education method: Explanation, Demonstration, Tactile cues, and Verbal cues Education comprehension: verbalized understanding and returned demonstration  HOME EXERCISE PROGRAM:   GOALS: Goals reviewed with patient? Yes  SHORT TERM GOALS: Target date: 03/31/2023  Patient to be independent with initial HEP. Baseline: HEP initiated Goal status: IN PROGRESS    LONG TERM GOALS: Target date: 04/21/2023  Patient to be independent with advanced HEP. Baseline: Not yet initiated  Goal status: IN PROGRESS  Patient to report 0/10 dizziness with standing vertical and horizontal VOR for 30 seconds. Baseline: Unable Goal status: IN PROGRESS  Patient will report 0/10 dizziness with bed mobility.  Baseline: Symptomatic  Goal status: IN PROGRESS  Patient to score at least 20/24 on DGI in order to decrease risk of falls. Baseline: NT Goal status: IN PROGRESS  Patient to score at least 52 on FOTO in order to indicate improved functional outcomes.  Baseline: 64 Goal status: IN PROGRESS  Patient to report no falls in the past 4 weeks. Baseline: - Goal status: IN PROGRESS     ASSESSMENT:  CLINICAL IMPRESSION: Pt reports feeling her eye symptoms have improved to a degree and notices decrease in blurred vision.  Pt reports feeling neck pain and limitation in ROM causing  discomfort with some activities and therapeutic interventions. Addressed with manual therapy to good effect.  Pt notes feeling lightheaded with position changes and this prompted investigation for orthostatic hypotension which was unremarkable.  Training in gaze stabilization and reinforcement of increased speed of movement vs amplitude and use of her symptoms as guide to limit degree of guarding. Continued sessions to progress HEP development and intervention strategies.   OBJECTIVE IMPAIRMENTS: Abnormal gait, decreased activity tolerance, decreased balance, decreased ROM, dizziness, and pain.   ACTIVITY LIMITATIONS: carrying, lifting, bending, sitting, standing, squatting, sleeping, stairs, transfers, bed mobility, bathing, dressing, reach over head, hygiene/grooming, and locomotion level  PARTICIPATION LIMITATIONS: meal prep, cleaning, laundry, driving, shopping, community activity, occupation, and church  PERSONAL FACTORS: Age, Past/current experiences, Time since onset of injury/illness/exacerbation, and 3+ comorbidities: Anxiety, asthma, CHF, CAD, depression, DM, HTN, breast CA  are also affecting patient's functional outcome.   REHAB POTENTIAL: Good  CLINICAL DECISION MAKING: Evolving/moderate complexity  EVALUATION COMPLEXITY: Moderate   PLAN:  PT FREQUENCY: 1-2x/week  PT DURATION: 6 weeks  PLANNED INTERVENTIONS: Therapeutic exercises, Therapeutic activity, Neuromuscular re-education, Balance training, Gait training, Patient/Family education, Self Care, Joint mobilization, Stair training, Vestibular training, Canalith repositioning, DME instructions, Aquatic Therapy, Dry Needling, Electrical stimulation, Cryotherapy, Moist heat, Taping, Manual therapy, and Re-evaluation  PLAN FOR NEXT SESSION: review HEP for habituation, balance, DGI    12:39 PM, 03/23/23 M. Shary Decamp, PT, DPT Physical Therapist- [place] Office Number: [phone]

## 2023-03-25 ENCOUNTER — Ambulatory Visit: Payer: Medicare Other

## 2023-03-25 ENCOUNTER — Ambulatory Visit: Payer: Medicare Other | Admitting: Family Medicine

## 2023-03-25 DIAGNOSIS — R42 Dizziness and giddiness: Secondary | ICD-10-CM | POA: Diagnosis not present

## 2023-03-25 DIAGNOSIS — R2681 Unsteadiness on feet: Secondary | ICD-10-CM

## 2023-03-25 DIAGNOSIS — R2689 Other abnormalities of gait and mobility: Secondary | ICD-10-CM | POA: Diagnosis not present

## 2023-03-25 NOTE — Therapy (Signed)
OUTPATIENT PHYSICAL THERAPY VESTIBULAR TREATMENT     Patient Name: Felicia Perry MRN: [id number] DOB:1946-09-18, 77 y.o., female Today's Date: 03/25/2023  END OF SESSION:  PT End of Session - 03/25/23 1452     Visit Number 4    Number of Visits 13    Date for PT Re-Evaluation 04/21/23    Authorization Type BCBS Medicare    PT Start Time 1445    PT Stop Time 1530    PT Time Calculation (min) 45 min    Activity Tolerance Patient tolerated treatment well    Behavior During Therapy [facility] for tasks assessed/performed              Past Medical History:  Diagnosis Date   Adenomatous polyp 11/23/2005   Anxiety    01/09/22- patient denies anxiety   Asthma 2003   Basal cell carcinoma 2021   right cheek per patient at [place] [facility]   Carotid stenosis    Chronic diastolic CHF (congestive heart failure) (HCC) 2017   Echo 2021 was normal   COPD (chronic obstructive pulmonary disease) (HCC)    Coronary artery disease 2017   a. HC on 08/14/16 showed RCA CTO s/p PCI, 60% LCX disease managed medically with recs to consider staged PCI if continued symptoms.    Depression 2008   pt reports MD diagnosed but she does not feel depressed   Diabetes mellitus without complication (HCC)    DIVERTICULOSIS, COLON 04/22/2007        GERD (gastroesophageal reflux disease) 2012   History of radiation therapy    Right breast- 03/04/22-04/02/22- Dr. Antony Blackbird   History of shingles    Hypertension 2008   Hypertensive heart disease    Insomnia 2003   Internal hemorrhoids    Osteoarthritis 2008   "knees, hands, lower back" (08/14/2016)   Pap smear for cervical cancer screening 2002   results normal   Personal history of radiation therapy    PUD (peptic ulcer disease) 1989   Seasonal allergies 2013   Sleep apnea    a. resolved with weight loss   Past Surgical History:  Procedure Laterality Date   BREAST BIOPSY Right 12/30/2021   BREAST EXCISIONAL BIOPSY Left    1980's x 3 - all  benign   BREAST EXCISIONAL BIOPSY Right    1980's x 1 or 2 - all benign   BREAST LUMPECTOMY Right 01/22/2022   BREAST LUMPECTOMY WITH RADIOACTIVE SEED LOCALIZATION Right 01/22/2022   Procedure: RIGHT BREAST LUMPECTOMY WITH RADIOACTIVE SEED LOCALIZATION;  Surgeon: Harriette Bouillon, MD;  Location: [facility];  Service: General;  Laterality: Right;   BREAST SURGERY Left 1980s   Fibrous Tumors removed    CARDIAC CATHETERIZATION N/A 08/14/2016   Procedure: Left Heart Cath and Coronary Angiography;  Surgeon: Tonny Bollman, MD;  Location: [facility] INVASIVE CV LAB;  Service: Cardiovascular;  Laterality: N/A;   CARDIAC CATHETERIZATION N/A 08/14/2016   Procedure: Coronary Stent Intervention;  Surgeon: Tonny Bollman, MD;  Location: [facility] INVASIVE CV LAB;  Service: Cardiovascular;  Laterality: N/A;   CATARACT EXTRACTION W/ INTRAOCULAR LENS  IMPLANT, BILATERAL Bilateral 05/2009   COLONOSCOPY N/A 2016   Cologuard 2019   CORONARY ANGIOPLASTY     detached retina left eye     may 2019   LEFT HEART CATH AND CORONARY ANGIOGRAPHY N/A 09/25/2022   Procedure: LEFT HEART CATH AND CORONARY ANGIOGRAPHY;  Surgeon: Kathleene Hazel, MD;  Location: [facility] INVASIVE CV LAB;  Service: Cardiovascular;  Laterality: N/A;  TUBAL LIGATION  1970s   Patient Active Problem List   Diagnosis Date Noted   Shortness of breath 09/25/2022   Genetic testing 01/19/2022   Ductal carcinoma in situ (DCIS) of right breast 01/05/2022   Diabetes mellitus type 2 with complications (HCC) 02/05/2021   Intractable headache 01/02/2021   Greater trochanteric bursitis of left hip 08/13/2020   Senile purpura (HCC) 08/07/2020   statin myalgia 06/25/2020   Carotid stenosis 01/23/2020   Chronic diastolic CHF (congestive heart failure) (HCC) 01/12/2019   Degenerative arthritis of knee, bilateral 12/08/2018   Partial hamstring tear, initial encounter 11/16/2018   Hyperlipidemia, unspecified 07/11/2018   Nonallopathic lesion of sacral  region 07/05/2018   Nonallopathic lesion of cervical region 07/05/2018   DDD (degenerative disc disease), cervical 05/04/2018   Degenerative arthritis of left knee 09/15/2017   H/O hyperparathyroidism 08/18/2016   Coronary artery disease involving native coronary artery of native heart without angina pectoris    PUD (peptic ulcer disease)    Syncope 08/04/2016   Vitamin D deficiency 08/20/2015   Hyperglycemia 08/06/2015   Xerosis of skin 08/06/2015   Hypersomnia with sleep apnea 03/26/2015   Chronic pain syndrome 03/26/2015   Nonallopathic lesion of lumbosacral region 11/13/2014   Osteoarthritis of left lower extremity 10/17/2014   Chronic meniscal tear of knee 10/17/2014   Nonallopathic lesion of thoracic region 09/14/2014   Nonallopathic lesion-rib cage 08/21/2014   Tendinopathy of right rotator cuff 07/30/2014   Piriformis syndrome of right side 07/30/2014   Insomnia 07/13/2014   Limited joint range of motion 07/13/2014   Rash 07/13/2014   GERD (gastroesophageal reflux disease) 02/25/2011   SI (sacroiliac) joint dysfunction 10/10/2007   LOW BACK PAIN 07/01/2007   Depression 04/22/2007   Essential hypertension 04/22/2007   Asthma 04/22/2007    PCP: Shelva Majestic, MD REFERRING PROVIDER: Judi Saa, DO  REFERRING DIAG: R42 (ICD-10-CM) - Dizziness R26.89 (ICD-10-CM) - Balance disorder   THERAPY DIAG:  Dizziness and giddiness  Unsteadiness on feet  ONSET DATE: 3-4 weeks   Rationale for Evaluation and Treatment: Rehabilitation  SUBJECTIVE:   SUBJECTIVE STATEMENT: Just got done cleaning house x 4 hours.  No positional dizziness, not feeling any bad symptoms with bending over such as getting things in/out of washer.  Noticing some numbness along left lateral thigh, leg and 4th-5th toes  Pt accompanied by: self  PERTINENT HISTORY: Anxiety, asthma, CHF, CAD, depression, DM, HTN, breast CA  PAIN:  Are you having pain? Yes: NPRS scale: 7/10 Pain location: B  posterolateral neck Pain description: stiff Aggravating factors: AM Relieving factors: tylenol  PRECAUTIONS: Fall  WEIGHT BEARING RESTRICTIONS: No  FALLS: Has patient fallen in last 6 months? Yes. 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 Cues for sequence  Manual therapy -STM to left cervical column to address trigger points -manual traction  -joint mobilization left > right column to improve extension, lateral flexion, rotation and reduce pain/stiffness  TODAY'S TREATMENT: 03/23/23 Activity Comments  R/L Roll test No nystagmus, no dizziness  Right Dix-Hallpike No dizziness/no nystagmus  Manual therapy Soft tissue mobilization cervical column to address subocciptal tenderness and levator trigger point left > right -joint mobilization to improve cervical rotation and lateral flexion  orthostatics Supine: 155/89 mmHg, 71 bpm Standing x 1 min: 154/43 mmHg, 80 bpm Standing x 3 min: 167/88 mmHg, 78 bpm  Seated gaze stabilization  Cues for decreased ROM and incr speed            HOME EXERCISE PROGRAM Last updated: 03/17/23 Access Code: [email or web address] URL: [email or web address] Date: 03/17/2023 Prepared by: [facility] - Outpatient  Rehab - [facility]  Exercises - Seated Shoulder Rolls  - 1 x daily - 5 x weekly - 2 sets - 10 reps - Seated Scapular Retraction  - 1 x daily - 5 x weekly - 2 sets - 10 reps - 3 sec hold - Seated  Cervical Rotation AROM  - 1 x daily - 5 x weekly - 2 sets - 10 reps - Gentle Levator Scapulae Stretch  - 1 x daily - 5 x weekly - 2 sets - 30 sec hold - Seated Gaze Stabilization with Head Rotation  - 1 x daily - 5 x weekly - 2-3 sets - 30 sec hold - Seated Gaze Stabilization with Head Nod  - 1 x daily - 5 x weekly - 2 sets - 30 sec hold -Brandt-Daroff 5 reps   PATIENT EDUCATION: Education details: HEP progression Person educated: Patient Education method: Explanation, Demonstration, Tactile cues, Verbal cues, and Handouts Education comprehension: verbalized understanding and returned demonstration    Below measures were taken at time of initial evaluation unless otherwise specified:   DIAGNOSTIC FINDINGS: none recent (pt did not have imaging after her falls)  COGNITION: Overall cognitive status: Within functional limits for tasks assessed   SENSATION: Report L lateral 2 toes N/T occasionally, otherwise intact    Cervical ROM:    Active AROM (deg) eval  Flexion 35  Extension 54  Right lateral flexion 25 * pain over L cervical paraspinals   Left lateral flexion 25 * pain over L cervical paraspinals   Right rotation 54  Left rotation 57 *c/o tight  (Blank rows = not tested)  GAIT: Gait pattern: guarded and slightly unsteady  Assistive device utilized: None Level of assistance: SBA   PATIENT SURVEYS:  FOTO 52, 64  VESTIBULAR ASSESSMENT:  GENERAL OBSERVATION: pt wears reading glasses   OCULOMOTOR EXAM:  Ocular Alignment: normal  Ocular ROM: No Limitations  Spontaneous Nystagmus: absent  Gaze-Induced Nystagmus: absent  Smooth Pursuits:  several saccades vertically, a couple horizontally   Saccades: intact  Convergence/Divergence: 4 cm limited by L eye  VESTIBULAR - OCULAR REFLEX:   Slow VOR: difficulty focusing and c/o feeling unsteady horizontal, c/o neck discomfort vertical  VOR Cancellation: Normal; c/o dizziness  Head-Impulse Test: HIT Right:  negative HIT Left: negative *this test caused an episode of retropulsion d/t pt feeling unsteady   POSITIONAL TESTING:  Right Roll Test: negative, dizziness upon sitting up Left Roll Test: negative, dizziness upon sitting up Right Dix-Hallpike: negative Left Dix-Hallpike: negative, dizziness upon sitting up Right Sidelying: negative Left Sidelying: negative, dizziness upon sitting up   VESTIBULAR TREATMENT:  DATE: 03/10/23    PATIENT EDUCATION: Education details: prognosis, POC, edu on BPPV and concussion Person educated: Patient Education method: Explanation, Demonstration, Tactile cues, and Verbal cues Education comprehension: verbalized understanding and returned demonstration  HOME EXERCISE PROGRAM:   GOALS: Goals reviewed with patient? Yes  SHORT TERM GOALS: Target date: 03/31/2023  Patient to be independent with initial HEP. Baseline: HEP initiated Goal status: IN PROGRESS    LONG TERM GOALS: Target date: 04/21/2023  Patient to be independent with advanced HEP. Baseline: Not yet initiated  Goal status: IN PROGRESS  Patient to report 0/10 dizziness with standing vertical and horizontal VOR for 30 seconds. Baseline: Unable Goal status: IN PROGRESS  Patient will report 0/10 dizziness with bed mobility.  Baseline: Symptomatic  Goal status: IN PROGRESS  Patient to score at least 20/24 on DGI in order to decrease risk of falls. Baseline: 22/24 Goal status: MET  Patient to score at least 52 on FOTO in order to indicate improved functional outcomes.  Baseline: 64 Goal status: IN PROGRESS  Patient to report no falls in the past 4 weeks. Baseline: - Goal status: IN PROGRESS     ASSESSMENT:  CLINICAL IMPRESSION: Performance of DGI with low risk for falls per score 22/24.  No return of positional dizziness but notes some motion sensitivity with arising from  supine. Initiated Brandt-Daroff for habituation with pt reporting decr intensity with subsequent reps. Ended with manual therapy to improve neck ROM and reduce pain. Continued sessions to progress POC details  OBJECTIVE IMPAIRMENTS: Abnormal gait, decreased activity tolerance, decreased balance, decreased ROM, dizziness, and pain.   ACTIVITY LIMITATIONS: carrying, lifting, bending, sitting, standing, squatting, sleeping, stairs, transfers, bed mobility, bathing, dressing, reach over head, hygiene/grooming, and locomotion level  PARTICIPATION LIMITATIONS: meal prep, cleaning, laundry, driving, shopping, community activity, occupation, and church  PERSONAL FACTORS: Age, Past/current experiences, Time since onset of injury/illness/exacerbation, and 3+ comorbidities: Anxiety, asthma, CHF, CAD, depression, DM, HTN, breast CA  are also affecting patient's functional outcome.   REHAB POTENTIAL: Good  CLINICAL DECISION MAKING: Evolving/moderate complexity  EVALUATION COMPLEXITY: Moderate   PLAN:  PT FREQUENCY: 1-2x/week  PT DURATION: 6 weeks  PLANNED INTERVENTIONS: Therapeutic exercises, Therapeutic activity, Neuromuscular re-education, Balance training, Gait training, Patient/Family education, Self Care, Joint mobilization, Stair training, Vestibular training, Canalith repositioning, DME instructions, Aquatic Therapy, Dry Needling, Electrical stimulation, Cryotherapy, Moist heat, Taping, Manual therapy, and Re-evaluation  PLAN FOR NEXT SESSION: review HEP for habituation, balance, DGI    2:52 PM, 03/25/23 M. Shary Decamp, PT, DPT Physical Therapist- [place] Office Number: [phone]

## 2023-03-29 ENCOUNTER — Ambulatory Visit: Payer: Medicare Other | Attending: Family Medicine

## 2023-03-29 DIAGNOSIS — R42 Dizziness and giddiness: Secondary | ICD-10-CM | POA: Diagnosis not present

## 2023-03-29 DIAGNOSIS — R2681 Unsteadiness on feet: Secondary | ICD-10-CM | POA: Insufficient documentation

## 2023-03-29 NOTE — Therapy (Signed)
OUTPATIENT PHYSICAL THERAPY VESTIBULAR TREATMENT     Patient Name: Felicia Perry MRN: [id number] DOB:1946-07-08, 77 y.o., female Today's Date: 03/29/2023  END OF SESSION:  PT End of Session - 03/29/23 1444     Visit Number 5    Number of Visits 13    Date for PT Re-Evaluation 04/21/23    Authorization Type BCBS Medicare    PT Start Time 1445    PT Stop Time 1530    PT Time Calculation (min) 45 min    Activity Tolerance Patient tolerated treatment well    Behavior During Therapy [facility] for tasks assessed/performed              Past Medical History:  Diagnosis Date   Adenomatous polyp 11/23/2005   Anxiety    01/09/22- patient denies anxiety   Asthma 2003   Basal cell carcinoma 2021   right cheek per patient at [place] [facility]   Carotid stenosis    Chronic diastolic CHF (congestive heart failure) (HCC) 2017   Echo 2021 was normal   COPD (chronic obstructive pulmonary disease) (HCC)    Coronary artery disease 2017   a. HC on 08/14/16 showed RCA CTO s/p PCI, 60% LCX disease managed medically with recs to consider staged PCI if continued symptoms.    Depression 2008   pt reports MD diagnosed but she does not feel depressed   Diabetes mellitus without complication (HCC)    DIVERTICULOSIS, COLON 04/22/2007        GERD (gastroesophageal reflux disease) 2012   History of radiation therapy    Right breast- 03/04/22-04/02/22- Dr. Antony Blackbird   History of shingles    Hypertension 2008   Hypertensive heart disease    Insomnia 2003   Internal hemorrhoids    Osteoarthritis 2008   "knees, hands, lower back" (08/14/2016)   Pap smear for cervical cancer screening 2002   results normal   Personal history of radiation therapy    PUD (peptic ulcer disease) 1989   Seasonal allergies 2013   Sleep apnea    a. resolved with weight loss   Past Surgical History:  Procedure Laterality Date   BREAST BIOPSY Right 12/30/2021   BREAST EXCISIONAL BIOPSY Left    1980's x 3 - all  benign   BREAST EXCISIONAL BIOPSY Right    1980's x 1 or 2 - all benign   BREAST LUMPECTOMY Right 01/22/2022   BREAST LUMPECTOMY WITH RADIOACTIVE SEED LOCALIZATION Right 01/22/2022   Procedure: RIGHT BREAST LUMPECTOMY WITH RADIOACTIVE SEED LOCALIZATION;  Surgeon: Harriette Bouillon, MD;  Location: [facility];  Service: General;  Laterality: Right;   BREAST SURGERY Left 1980s   Fibrous Tumors removed    CARDIAC CATHETERIZATION N/A 08/14/2016   Procedure: Left Heart Cath and Coronary Angiography;  Surgeon: Tonny Bollman, MD;  Location: [facility] INVASIVE CV LAB;  Service: Cardiovascular;  Laterality: N/A;   CARDIAC CATHETERIZATION N/A 08/14/2016   Procedure: Coronary Stent Intervention;  Surgeon: Tonny Bollman, MD;  Location: [facility] INVASIVE CV LAB;  Service: Cardiovascular;  Laterality: N/A;   CATARACT EXTRACTION W/ INTRAOCULAR LENS  IMPLANT, BILATERAL Bilateral 05/2009   COLONOSCOPY N/A 2016   Cologuard 2019   CORONARY ANGIOPLASTY     detached retina left eye     may 2019   LEFT HEART CATH AND CORONARY ANGIOGRAPHY N/A 09/25/2022   Procedure: LEFT HEART CATH AND CORONARY ANGIOGRAPHY;  Surgeon: Kathleene Hazel, MD;  Location: [facility] INVASIVE CV LAB;  Service: Cardiovascular;  Laterality: N/A;  TUBAL LIGATION  1970s   Patient Active Problem List   Diagnosis Date Noted   Shortness of breath 09/25/2022   Genetic testing 01/19/2022   Ductal carcinoma in situ (DCIS) of right breast 01/05/2022   Diabetes mellitus type 2 with complications (HCC) 02/05/2021   Intractable headache 01/02/2021   Greater trochanteric bursitis of left hip 08/13/2020   Senile purpura (HCC) 08/07/2020   statin myalgia 06/25/2020   Carotid stenosis 01/23/2020   Chronic diastolic CHF (congestive heart failure) (HCC) 01/12/2019   Degenerative arthritis of knee, bilateral 12/08/2018   Partial hamstring tear, initial encounter 11/16/2018   Hyperlipidemia, unspecified 07/11/2018   Nonallopathic lesion of sacral  region 07/05/2018   Nonallopathic lesion of cervical region 07/05/2018   DDD (degenerative disc disease), cervical 05/04/2018   Degenerative arthritis of left knee 09/15/2017   H/O hyperparathyroidism 08/18/2016   Coronary artery disease involving native coronary artery of native heart without angina pectoris    PUD (peptic ulcer disease)    Syncope 08/04/2016   Vitamin D deficiency 08/20/2015   Hyperglycemia 08/06/2015   Xerosis of skin 08/06/2015   Hypersomnia with sleep apnea 03/26/2015   Chronic pain syndrome 03/26/2015   Nonallopathic lesion of lumbosacral region 11/13/2014   Osteoarthritis of left lower extremity 10/17/2014   Chronic meniscal tear of knee 10/17/2014   Nonallopathic lesion of thoracic region 09/14/2014   Nonallopathic lesion-rib cage 08/21/2014   Tendinopathy of right rotator cuff 07/30/2014   Piriformis syndrome of right side 07/30/2014   Insomnia 07/13/2014   Limited joint range of motion 07/13/2014   Rash 07/13/2014   GERD (gastroesophageal reflux disease) 02/25/2011   SI (sacroiliac) joint dysfunction 10/10/2007   LOW BACK PAIN 07/01/2007   Depression 04/22/2007   Essential hypertension 04/22/2007   Asthma 04/22/2007    PCP: Shelva Majestic, MD REFERRING PROVIDER: Judi Saa, DO  REFERRING DIAG: R42 (ICD-10-CM) - Dizziness R26.89 (ICD-10-CM) - Balance disorder   THERAPY DIAG:  Dizziness and giddiness  Unsteadiness on feet  ONSET DATE: 3-4 weeks   Rationale for Evaluation and Treatment: Rehabilitation  SUBJECTIVE:   SUBJECTIVE STATEMENT: No "dizzy" spells.  Some unsteadiness when arising from rest.  Note that when working/activity overall helps and improve.  Able to ride bike a little bit over the weekend   Pt accompanied by: self  PERTINENT HISTORY: Anxiety, asthma, CHF, CAD, depression, DM, HTN, breast CA  PAIN:  Are you having pain? Yes: NPRS scale: 7/10 Pain location: B posterolateral neck Pain description:  stiff Aggravating factors: AM Relieving factors: tylenol  PRECAUTIONS: Fall  WEIGHT BEARING RESTRICTIONS: No  FALLS: Has patient fallen in last 6 months? Yes. 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   PATIENT GOALS: improve dizziness  OBJECTIVE:   TODAY'S TREATMENT: 03/29/23 Activity Comments  Shoulder shrugs/neck rolls   VOR x 1 -seated x 30 sec, no symptoms -standing x 30 sec--no symptoms  Forward-T w/ counter support 2x5 Fixate on target on floor as tip forward, fixate on target on wall as arise--feels "woozy"  Manual therapy -joint mobilization grade 2-3 bilateral cervical column to address neck pain and ROM restrirctions for lateral flexion and extension -soft tissue mobilization left trapezial ridge, scalenes, and levator scap to address trigger points and pain  to improve head/neck movement           TODAY'S TREATMENT: 03/25/23 Activity Comments  Dynamic Gait Index 22/24  Bed mobility Sit to supine: no dizziness Supine to sit: woozy  Brandt-Daroff 5 reps No symptoms with sit to sidelying, woozy with arising. Cues for sequence  Manual therapy -STM to left cervical column to address trigger points -manual traction  -joint mobilization left > right column to improve extension, lateral flexion, rotation and reduce pain/stiffness               HOME EXERCISE PROGRAM Last updated: 03/17/23 Access Code: [email or web address] URL: [email or web address] Date: 03/17/2023 Prepared by: [facility] - Outpatient  Rehab - [facility]  Exercises - Seated Shoulder Rolls  - 1 x daily - 5 x weekly - 2 sets - 10 reps - Seated Scapular Retraction  - 1 x daily - 5 x weekly - 2 sets - 10 reps - 3 sec hold -  Seated Cervical Rotation AROM  - 1 x daily - 5 x weekly - 2 sets - 10 reps - Gentle Levator Scapulae Stretch  - 1 x daily - 5 x weekly - 2 sets - 30 sec hold - Seated Gaze Stabilization with Head Rotation  - 1 x daily - 5 x weekly - 2-3 sets - 30 sec hold - Seated Gaze Stabilization with Head Nod  - 1 x daily - 5 x weekly - 2 sets - 30 sec hold -Brandt-Daroff 5 reps - Forward T with Counter Support  - 1 x daily - 7 x weekly - 2-3 sets - 5 reps   PATIENT EDUCATION: Education details: HEP progression Person educated: Patient Education method: Explanation, Demonstration, Tactile cues, Verbal cues, and Handouts Education comprehension: verbalized understanding and returned demonstration    Below measures were taken at time of initial evaluation unless otherwise specified:   DIAGNOSTIC FINDINGS: none recent (pt did not have imaging after her falls)  COGNITION: Overall cognitive status: Within functional limits for tasks assessed   SENSATION: Report L lateral 2 toes N/T occasionally, otherwise intact    Cervical ROM:    Active AROM (deg) eval  Flexion 35  Extension 54  Right lateral flexion 25 * pain over L cervical paraspinals   Left lateral flexion 25 * pain over L cervical paraspinals   Right rotation 54  Left rotation 57 *c/o tight  (Blank rows = not tested)  GAIT: Gait pattern: guarded and slightly unsteady  Assistive device utilized: None Level of assistance: SBA   PATIENT SURVEYS:  FOTO 52, 64  VESTIBULAR ASSESSMENT:  GENERAL OBSERVATION: pt wears reading glasses   OCULOMOTOR EXAM:  Ocular Alignment: normal  Ocular ROM: No Limitations  Spontaneous Nystagmus: absent  Gaze-Induced Nystagmus: absent  Smooth Pursuits:  several saccades vertically, a couple horizontally   Saccades: intact  Convergence/Divergence: 4 cm limited by L eye  VESTIBULAR - OCULAR REFLEX:   Slow VOR: difficulty focusing and c/o feeling unsteady horizontal, c/o neck discomfort  vertical  VOR Cancellation: Normal; c/o dizziness  Head-Impulse Test: HIT Right: negative HIT Left: negative *this test caused an episode of retropulsion d/t pt feeling unsteady   POSITIONAL TESTING:  Right Roll Test: negative, dizziness upon sitting up Left Roll Test: negative, dizziness upon sitting up Right Dix-Hallpike: negative Left Dix-Hallpike: negative, dizziness upon sitting up Right Sidelying: negative Left Sidelying: negative, dizziness upon sitting up      GOALS: Goals reviewed with patient? Yes  SHORT TERM GOALS: Target date: 03/31/2023  Patient  to be independent with initial HEP. Baseline: HEP initiated Goal status: IN PROGRESS    LONG TERM GOALS: Target date: 04/21/2023  Patient to be independent with advanced HEP. Baseline: Not yet initiated  Goal status: IN PROGRESS  Patient to report 0/10 dizziness with standing vertical and horizontal VOR for 30 seconds. Baseline: Unable Goal status: IN PROGRESS  Patient will report 0/10 dizziness with bed mobility.  Baseline: Symptomatic  Goal status: IN PROGRESS  Patient to score at least 20/24 on DGI in order to decrease risk of falls. Baseline: 22/24 Goal status: MET  Patient to score at least 52 on FOTO in order to indicate improved functional outcomes.  Baseline: 64 Goal status: IN PROGRESS  Patient to report no falls in the past 4 weeks. Baseline: - Goal status: IN PROGRESS     ASSESSMENT:  CLINICAL IMPRESSION: Patient reports overall improvement in symptoms and status and has not experienced any additional episodes of positional vertigo.  Continues to experience some instances of motion sensitivity to large and fast body movements such as forward bending/extension.  Tolerating habituation activities quite well and reports that performing Brandt-Daroff for repetition seems to diminish the intensity with subsequent repetitions.  No disturbance to VOR x 1 in sitting or standing noted/reported.   Appreciate large, palpable and irritable trigger points along left trapezial ridge, scalenes and levator scap which was addressed with manual therapy to good effect per pt report with decreased sensitivity post-tx.  Provided with additional habituation activity for HEP today  OBJECTIVE IMPAIRMENTS: Abnormal gait, decreased activity tolerance, decreased balance, decreased ROM, dizziness, and pain.   ACTIVITY LIMITATIONS: carrying, lifting, bending, sitting, standing, squatting, sleeping, stairs, transfers, bed mobility, bathing, dressing, reach over head, hygiene/grooming, and locomotion level  PARTICIPATION LIMITATIONS: meal prep, cleaning, laundry, driving, shopping, community activity, occupation, and church  PERSONAL FACTORS: Age, Past/current experiences, Time since onset of injury/illness/exacerbation, and 3+ comorbidities: Anxiety, asthma, CHF, CAD, depression, DM, HTN, breast CA  are also affecting patient's functional outcome.   REHAB POTENTIAL: Good  CLINICAL DECISION MAKING: Evolving/moderate complexity  EVALUATION COMPLEXITY: Moderate   PLAN:  PT FREQUENCY: 1-2x/week  PT DURATION: 6 weeks  PLANNED INTERVENTIONS: Therapeutic exercises, Therapeutic activity, Neuromuscular re-education, Balance training, Gait training, Patient/Family education, Self Care, Joint mobilization, Stair training, Vestibular training, Canalith repositioning, DME instructions, Aquatic Therapy, Dry Needling, Electrical stimulation, Cryotherapy, Moist heat, Taping, Manual therapy, and Re-evaluation  PLAN FOR NEXT SESSION: VOR x 1, walking VOR, assess soft tissue restrictions in c-spine    2:44 PM, 03/29/23 M. Shary Decamp, PT, DPT Physical Therapist- [place] Office Number: [phone]

## 2023-03-30 NOTE — Therapy (Signed)
OUTPATIENT PHYSICAL THERAPY VESTIBULAR TREATMENT     Patient Name: Felicia Perry MRN: [id number] DOB:1946-07-06, 77 y.o., female Today's Date: 03/31/2023  END OF SESSION:  PT End of Session - 03/31/23 1611     Visit Number 6    Number of Visits 13    Date for PT Re-Evaluation 04/21/23    Authorization Type BCBS Medicare    PT Start Time 1535    PT Stop Time 1613    PT Time Calculation (min) 38 min    Equipment Utilized During Treatment Gait belt    Activity Tolerance Patient tolerated treatment well    Behavior During Therapy WFL for tasks assessed/performed               Past Medical History:  Diagnosis Date   Adenomatous polyp 11/23/2005   Anxiety    01/09/22- patient denies anxiety   Asthma 2003   Basal cell carcinoma 2021   right cheek per patient at [facility] [facility]   Carotid stenosis    Chronic diastolic CHF (congestive heart failure) (HCC) 2017   Echo 2021 was normal   COPD (chronic obstructive pulmonary disease) (HCC)    Coronary artery disease 2017   a. HC on 08/14/16 showed RCA CTO s/p PCI, 60% LCX disease managed medically with recs to consider staged PCI if continued symptoms.    Depression 2008   pt reports MD diagnosed but she does not feel depressed   Diabetes mellitus without complication (HCC)    DIVERTICULOSIS, COLON 04/22/2007        GERD (gastroesophageal reflux disease) 2012   History of radiation therapy    Right breast- 03/04/22-04/02/22- Dr. Antony Blackbird   History of shingles    Hypertension 2008   Hypertensive heart disease    Insomnia 2003   Internal hemorrhoids    Osteoarthritis 2008   "knees, hands, lower back" (08/14/2016)   Pap smear for cervical cancer screening 2002   results normal   Personal history of radiation therapy    PUD (peptic ulcer disease) 1989   Seasonal allergies 2013   Sleep apnea    a. resolved with weight loss   Past Surgical History:  Procedure Laterality Date   BREAST BIOPSY Right 12/30/2021    BREAST EXCISIONAL BIOPSY Left    1980's x 3 - all benign   BREAST EXCISIONAL BIOPSY Right    1980's x 1 or 2 - all benign   BREAST LUMPECTOMY Right 01/22/2022   BREAST LUMPECTOMY WITH RADIOACTIVE SEED LOCALIZATION Right 01/22/2022   Procedure: RIGHT BREAST LUMPECTOMY WITH RADIOACTIVE SEED LOCALIZATION;  Surgeon: Harriette Bouillon, MD;  Location:  SURGERY CENTER;  Service: General;  Laterality: Right;   BREAST SURGERY Left 1980s   Fibrous Tumors removed    CARDIAC CATHETERIZATION N/A 08/14/2016   Procedure: Left Heart Cath and Coronary Angiography;  Surgeon: Tonny Bollman, MD;  Location: [facility] INVASIVE CV LAB;  Service: Cardiovascular;  Laterality: N/A;   CARDIAC CATHETERIZATION N/A 08/14/2016   Procedure: Coronary Stent Intervention;  Surgeon: Tonny Bollman, MD;  Location: [facility] INVASIVE CV LAB;  Service: Cardiovascular;  Laterality: N/A;   CATARACT EXTRACTION W/ INTRAOCULAR LENS  IMPLANT, BILATERAL Bilateral 05/2009   COLONOSCOPY N/A 2016   Cologuard 2019   CORONARY ANGIOPLASTY     detached retina left eye     may 2019   LEFT HEART CATH AND CORONARY ANGIOGRAPHY N/A 09/25/2022   Procedure: LEFT HEART CATH AND CORONARY ANGIOGRAPHY;  Surgeon: Kathleene Hazel, MD;  Location: [facility] INVASIVE  CV LAB;  Service: Cardiovascular;  Laterality: N/A;   TUBAL LIGATION  1970s   Patient Active Problem List   Diagnosis Date Noted   Shortness of breath 09/25/2022   Genetic testing 01/19/2022   Ductal carcinoma in situ (DCIS) of right breast 01/05/2022   Diabetes mellitus type 2 with complications (HCC) 02/05/2021   Intractable headache 01/02/2021   Greater trochanteric bursitis of left hip 08/13/2020   Senile purpura (HCC) 08/07/2020   statin myalgia 06/25/2020   Carotid stenosis 01/23/2020   Chronic diastolic CHF (congestive heart failure) (HCC) 01/12/2019   Degenerative arthritis of knee, bilateral 12/08/2018   Partial hamstring tear, initial encounter 11/16/2018   Hyperlipidemia,  unspecified 07/11/2018   Nonallopathic lesion of sacral region 07/05/2018   Nonallopathic lesion of cervical region 07/05/2018   DDD (degenerative disc disease), cervical 05/04/2018   Degenerative arthritis of left knee 09/15/2017   H/O hyperparathyroidism 08/18/2016   Coronary artery disease involving native coronary artery of native heart without angina pectoris    PUD (peptic ulcer disease)    Syncope 08/04/2016   Vitamin D deficiency 08/20/2015   Hyperglycemia 08/06/2015   Xerosis of skin 08/06/2015   Hypersomnia with sleep apnea 03/26/2015   Chronic pain syndrome 03/26/2015   Nonallopathic lesion of lumbosacral region 11/13/2014   Osteoarthritis of left lower extremity 10/17/2014   Chronic meniscal tear of knee 10/17/2014   Nonallopathic lesion of thoracic region 09/14/2014   Nonallopathic lesion-rib cage 08/21/2014   Tendinopathy of right rotator cuff 07/30/2014   Piriformis syndrome of right side 07/30/2014   Insomnia 07/13/2014   Limited joint range of motion 07/13/2014   Rash 07/13/2014   GERD (gastroesophageal reflux disease) 02/25/2011   SI (sacroiliac) joint dysfunction 10/10/2007   LOW BACK PAIN 07/01/2007   Depression 04/22/2007   Essential hypertension 04/22/2007   Asthma 04/22/2007    PCP: Shelva Majestic, MD REFERRING PROVIDER: Judi Saa, DO  REFERRING DIAG: R42 (ICD-10-CM) - Dizziness R26.89 (ICD-10-CM) - Balance disorder   THERAPY DIAG:  Dizziness and giddiness  Unsteadiness on feet  ONSET DATE: 3-4 weeks   Rationale for Evaluation and Treatment: Rehabilitation  SUBJECTIVE:   SUBJECTIVE STATEMENT: No dizzy spells, just a little woozy sometimes. Balance is better but neck is still sore. Has an appointment with eye MD as the "eyes don't feel right."   Pt accompanied by: self  PERTINENT HISTORY: Anxiety, asthma, CHF, CAD, depression, DM, HTN, breast CA  PAIN:  Are you having pain? Yes: NPRS scale: <5/10 Pain location: B  posterolateral neck Pain description: stiff Aggravating factors: AM Relieving factors: tylenol  PRECAUTIONS: Fall  WEIGHT BEARING RESTRICTIONS: No  FALLS: Has patient fallen in last 6 months? Yes. 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   PATIENT GOALS: improve dizziness  OBJECTIVE:     TODAY'S TREATMENT: 03/31/23 Activity Comments  brandt daroff 1x each EO and EC No dizziness, some LOB upon sitting up  standing vertical/horizontal VOR 30" each Some posterior LOB, moderate wooziness upon stopping horizontal, mild wooziness vertical   gait + head turns/nods 2x79ft each CGA; imbalance upon stopping   1/2 turns to targets  Cueing for larger intentional steps; 1 LOB requiring mod A  bending to set down cone, forward step over  it  "My eyes feel crazy" occasional min A required for balance   Sitting belly breathing with self feedback (1 hand on belly, 1 on chest)        HOME EXERCISE PROGRAM Last updated: 03/31/23 Access Code: [email or web address] URL: [email or web address] Date: 03/31/2023 Prepared by: [facility] - Outpatient  Rehab - [facility]  Exercises - Seated Shoulder Rolls  - 1 x daily - 5 x weekly - 2 sets - 10 reps - Seated Scapular Retraction  - 1 x daily - 5 x weekly - 2 sets - 10 reps - 3 sec hold - Seated Cervical Rotation AROM  - 1 x daily - 5 x weekly - 2 sets - 10 reps - Gentle Levator Scapulae Stretch  - 1 x daily - 5 x weekly - 2 sets - 30 sec hold - Forward T with Counter Support  - 1 x daily - 7 x weekly - 2-3 sets - 5 reps - Standing Gaze Stabilization with Head Rotation  - 1 x daily - 5 x weekly - 2-3 sets - 30 sec hold - Standing Gaze Stabilization with Head Nod  - 1 x daily - 5 x  weekly - 2-3 sets - 30 sec hold   PATIENT EDUCATION: Education details: HEP update with HEP for safety Person educated: Patient Education method: Explanation, Demonstration, Tactile cues, Verbal cues, and Handouts Education comprehension: verbalized understanding and returned demonstration    Below measures were taken at time of initial evaluation unless otherwise specified:   DIAGNOSTIC FINDINGS: none recent (pt did not have imaging after her falls)  COGNITION: Overall cognitive status: Within functional limits for tasks assessed   SENSATION: Report L lateral 2 toes N/T occasionally, otherwise intact    Cervical ROM:    Active AROM (deg) eval  Flexion 35  Extension 54  Right lateral flexion 25 * pain over L cervical paraspinals   Left lateral flexion 25 * pain over L cervical paraspinals   Right rotation 54  Left rotation 57 *c/o tight  (Blank rows = not tested)  GAIT: Gait pattern: guarded and slightly unsteady  Assistive device utilized: None Level of assistance: SBA   PATIENT SURVEYS:  FOTO 52, 64  VESTIBULAR ASSESSMENT:  GENERAL OBSERVATION: pt wears reading glasses   OCULOMOTOR EXAM:  Ocular Alignment: normal  Ocular ROM: No Limitations  Spontaneous Nystagmus: absent  Gaze-Induced Nystagmus: absent  Smooth Pursuits:  several saccades vertically, a couple horizontally   Saccades: intact  Convergence/Divergence: 4 cm limited by L eye  VESTIBULAR - OCULAR REFLEX:   Slow VOR: difficulty focusing and c/o feeling unsteady horizontal, c/o neck discomfort vertical  VOR Cancellation: Normal; c/o dizziness  Head-Impulse Test: HIT Right: negative HIT Left: negative *this test caused an episode of retropulsion d/t pt feeling unsteady   POSITIONAL TESTING:  Right Roll Test: negative, dizziness upon sitting up Left Roll Test: negative, dizziness upon sitting up Right Dix-Hallpike: negative Left Dix-Hallpike: negative, dizziness upon sitting up Right  Sidelying: negative Left Sidelying: negative, dizziness upon sitting up      GOALS: Goals reviewed with patient? Yes  SHORT TERM GOALS: Target date: 03/31/2023  Patient to be independent with initial HEP. Baseline: HEP initiated Goal status: IN PROGRESS    LONG TERM GOALS: Target date: 04/21/2023  Patient to be independent with advanced HEP. Baseline: Not yet initiated  Goal status: IN PROGRESS  Patient to report 0/10 dizziness with standing vertical and horizontal VOR for 30 seconds. Baseline: Unable Goal status: IN PROGRESS  Patient will report 0/10 dizziness with bed mobility.  Baseline: Symptomatic  Goal status: IN PROGRESS  Patient to score at least 20/24 on DGI in order to decrease risk of falls. Baseline: 22/24 Goal status: MET  Patient to score at least 52 on FOTO in order to indicate improved functional outcomes.  Baseline: 64 Goal status: IN PROGRESS  Patient to report no falls in the past 4 weeks. Baseline: - Goal status: IN PROGRESS     ASSESSMENT:  CLINICAL IMPRESSION: Patient arrived to session with report of improved balance, dizziness but remaining neck soreness and eye issues. Patient no longer had dizziness with habituation activities today, removed this some HEP. Still demonstrating some imbalance and wooziness with VOR activities. Patient performed body turns, bending, and head turns with gait as progression of habituation. Remaining imbalance evident with these activities, thus opted not to update into HEP quite yet.  No complaints at end of session.   OBJECTIVE IMPAIRMENTS: Abnormal gait, decreased activity tolerance, decreased balance, decreased ROM, dizziness, and pain.   ACTIVITY LIMITATIONS: carrying, lifting, bending, sitting, standing, squatting, sleeping, stairs, transfers, bed mobility, bathing, dressing, reach over head, hygiene/grooming, and locomotion level  PARTICIPATION LIMITATIONS: meal prep, cleaning, laundry, driving, shopping,  community activity, occupation, and church  PERSONAL FACTORS: Age, Past/current experiences, Time since onset of injury/illness/exacerbation, and 3+ comorbidities: Anxiety, asthma, CHF, CAD, depression, DM, HTN, breast CA  are also affecting patient's functional outcome.   REHAB POTENTIAL: Good  CLINICAL DECISION MAKING: Evolving/moderate complexity  EVALUATION COMPLEXITY: Moderate   PLAN:  PT FREQUENCY: 1-2x/week  PT DURATION: 6 weeks  PLANNED INTERVENTIONS: Therapeutic exercises, Therapeutic activity, Neuromuscular re-education, Balance training, Gait training, Patient/Family education, Self Care, Joint mobilization, Stair training, Vestibular training, Canalith repositioning, DME instructions, Aquatic Therapy, Dry Needling, Electrical stimulation, Cryotherapy, Moist heat, Taping, Manual therapy, and Re-evaluation  PLAN FOR NEXT SESSION: VOR x 1, walking VOR, assess soft tissue restrictions in c-spine  Anette Guarneri, PT, DPT 03/31/23 4:13 PM  [facility] [address] [place], [place] [address] Phone # [phone] Fax # [phone]

## 2023-03-31 ENCOUNTER — Ambulatory Visit: Payer: Medicare Other | Admitting: Physical Therapy

## 2023-03-31 ENCOUNTER — Encounter: Payer: Self-pay | Admitting: Physical Therapy

## 2023-03-31 DIAGNOSIS — R2681 Unsteadiness on feet: Secondary | ICD-10-CM | POA: Diagnosis not present

## 2023-03-31 DIAGNOSIS — R42 Dizziness and giddiness: Secondary | ICD-10-CM | POA: Diagnosis not present

## 2023-04-05 ENCOUNTER — Ambulatory Visit: Payer: Medicare Other

## 2023-04-05 ENCOUNTER — Ambulatory Visit (INDEPENDENT_AMBULATORY_CARE_PROVIDER_SITE_OTHER): Payer: Medicare Other

## 2023-04-05 VITALS — BP 128/64 | HR 71 | Temp 97.7°F | Wt 158.8 lb

## 2023-04-05 DIAGNOSIS — R2681 Unsteadiness on feet: Secondary | ICD-10-CM | POA: Diagnosis not present

## 2023-04-05 DIAGNOSIS — R42 Dizziness and giddiness: Secondary | ICD-10-CM | POA: Diagnosis not present

## 2023-04-05 DIAGNOSIS — Z0000 Encounter for general adult medical examination without abnormal findings: Secondary | ICD-10-CM | POA: Diagnosis not present

## 2023-04-05 NOTE — Therapy (Signed)
OUTPATIENT PHYSICAL THERAPY VESTIBULAR TREATMENT     Patient Name: Felicia Perry MRN: [id number] DOB:29-Jul-1946, 77 y.o., female Today's Date: 04/05/2023  END OF SESSION:  PT End of Session - 04/05/23 1443     Visit Number 7    Number of Visits 13    Date for PT Re-Evaluation 04/21/23    Authorization Type BCBS Medicare    PT Start Time 1445    PT Stop Time 1530    PT Time Calculation (min) 45 min    Equipment Utilized During Treatment Gait belt    Activity Tolerance Patient tolerated treatment well    Behavior During Therapy WFL for tasks assessed/performed               Past Medical History:  Diagnosis Date   Adenomatous polyp 11/23/2005   Anxiety    01/09/22- patient denies anxiety   Asthma 2003   Basal cell carcinoma 2021   right cheek per patient at [place] [facility]   Carotid stenosis    Chronic diastolic CHF (congestive heart failure) (HCC) 2017   Echo 2021 was normal   COPD (chronic obstructive pulmonary disease) (HCC)    Coronary artery disease 2017   a. HC on 08/14/16 showed RCA CTO s/p PCI, 60% LCX disease managed medically with recs to consider staged PCI if continued symptoms.    Depression 2008   pt reports MD diagnosed but she does not feel depressed   Diabetes mellitus without complication (HCC)    DIVERTICULOSIS, COLON 04/22/2007        GERD (gastroesophageal reflux disease) 2012   History of radiation therapy    Right breast- 03/04/22-04/02/22- Dr. Antony Blackbird   History of shingles    Hypertension 2008   Hypertensive heart disease    Insomnia 2003   Internal hemorrhoids    Osteoarthritis 2008   "knees, hands, lower back" (08/14/2016)   Pap smear for cervical cancer screening 2002   results normal   Personal history of radiation therapy    PUD (peptic ulcer disease) 1989   Seasonal allergies 2013   Sleep apnea    a. resolved with weight loss   Past Surgical History:  Procedure Laterality Date   BREAST BIOPSY Right 12/30/2021    BREAST EXCISIONAL BIOPSY Left    1980's x 3 - all benign   BREAST EXCISIONAL BIOPSY Right    1980's x 1 or 2 - all benign   BREAST LUMPECTOMY Right 01/22/2022   BREAST LUMPECTOMY WITH RADIOACTIVE SEED LOCALIZATION Right 01/22/2022   Procedure: RIGHT BREAST LUMPECTOMY WITH RADIOACTIVE SEED LOCALIZATION;  Surgeon: Harriette Bouillon, MD;  Location: [facility];  Service: General;  Laterality: Right;   BREAST SURGERY Left 1980s   Fibrous Tumors removed    CARDIAC CATHETERIZATION N/A 08/14/2016   Procedure: Left Heart Cath and Coronary Angiography;  Surgeon: Tonny Bollman, MD;  Location: [facility] INVASIVE CV LAB;  Service: Cardiovascular;  Laterality: N/A;   CARDIAC CATHETERIZATION N/A 08/14/2016   Procedure: Coronary Stent Intervention;  Surgeon: Tonny Bollman, MD;  Location: [facility] INVASIVE CV LAB;  Service: Cardiovascular;  Laterality: N/A;   CATARACT EXTRACTION W/ INTRAOCULAR LENS  IMPLANT, BILATERAL Bilateral 05/2009   COLONOSCOPY N/A 2016   Cologuard 2019   CORONARY ANGIOPLASTY     detached retina left eye     may 2019   LEFT HEART CATH AND CORONARY ANGIOGRAPHY N/A 09/25/2022   Procedure: LEFT HEART CATH AND CORONARY ANGIOGRAPHY;  Surgeon: Kathleene Hazel, MD;  Location: [facility] INVASIVE  CV LAB;  Service: Cardiovascular;  Laterality: N/A;   TUBAL LIGATION  1970s   Patient Active Problem List   Diagnosis Date Noted   Shortness of breath 09/25/2022   Genetic testing 01/19/2022   Ductal carcinoma in situ (DCIS) of right breast 01/05/2022   Diabetes mellitus type 2 with complications (HCC) 02/05/2021   Intractable headache 01/02/2021   Greater trochanteric bursitis of left hip 08/13/2020   Senile purpura (HCC) 08/07/2020   statin myalgia 06/25/2020   Carotid stenosis 01/23/2020   Chronic diastolic CHF (congestive heart failure) (HCC) 01/12/2019   Degenerative arthritis of knee, bilateral 12/08/2018   Partial hamstring tear, initial encounter 11/16/2018   Hyperlipidemia,  unspecified 07/11/2018   Nonallopathic lesion of sacral region 07/05/2018   Nonallopathic lesion of cervical region 07/05/2018   DDD (degenerative disc disease), cervical 05/04/2018   Degenerative arthritis of left knee 09/15/2017   H/O hyperparathyroidism 08/18/2016   Coronary artery disease involving native coronary artery of native heart without angina pectoris    PUD (peptic ulcer disease)    Syncope 08/04/2016   Vitamin D deficiency 08/20/2015   Hyperglycemia 08/06/2015   Xerosis of skin 08/06/2015   Hypersomnia with sleep apnea 03/26/2015   Chronic pain syndrome 03/26/2015   Nonallopathic lesion of lumbosacral region 11/13/2014   Osteoarthritis of left lower extremity 10/17/2014   Chronic meniscal tear of knee 10/17/2014   Nonallopathic lesion of thoracic region 09/14/2014   Nonallopathic lesion-rib cage 08/21/2014   Tendinopathy of right rotator cuff 07/30/2014   Piriformis syndrome of right side 07/30/2014   Insomnia 07/13/2014   Limited joint range of motion 07/13/2014   Rash 07/13/2014   GERD (gastroesophageal reflux disease) 02/25/2011   SI (sacroiliac) joint dysfunction 10/10/2007   LOW BACK PAIN 07/01/2007   Depression 04/22/2007   Essential hypertension 04/22/2007   Asthma 04/22/2007    PCP: Shelva Majestic, MD REFERRING PROVIDER: Judi Saa, DO  REFERRING DIAG: R42 (ICD-10-CM) - Dizziness R26.89 (ICD-10-CM) - Balance disorder   THERAPY DIAG:  Dizziness and giddiness  Unsteadiness on feet  ONSET DATE: 3-4 weeks   Rationale for Evaluation and Treatment: Rehabilitation  SUBJECTIVE:   SUBJECTIVE STATEMENT: No dizzy spells experienced.  Still having "woozy" feelings when transferring to stand which lasts just a few moments.  Pt accompanied by: self  PERTINENT HISTORY: Anxiety, asthma, CHF, CAD, depression, DM, HTN, breast CA  PAIN:  Are you having pain? Yes: NPRS scale: <5/10 Pain location: B posterolateral neck Pain description:  stiff Aggravating factors: AM Relieving factors: tylenol  PRECAUTIONS: Fall  WEIGHT BEARING RESTRICTIONS: No  FALLS: Has patient fallen in last 6 months? Yes. 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   PATIENT GOALS: improve dizziness  OBJECTIVE:   TODAY'S TREATMENT: 04/05/23 Activity Comments  Standing VOR x 1   Walking VOR x 1 -near target -far target -alternating near/far   Quick turns and placing card on target -180 deg turns left/right  Standing on foam -EO/EC x 30 sec - head turns 3x EO/EC Two rounds  HEP review          TODAY'S TREATMENT: 03/31/23 Activity Comments  brandt daroff 1x each EO and EC No dizziness, some LOB upon sitting up  standing vertical/horizontal VOR 30"  each Some posterior LOB, moderate wooziness upon stopping horizontal, mild wooziness vertical   gait + head turns/nods 2x53ft each CGA; imbalance upon stopping   1/2 turns to targets  Cueing for larger intentional steps; 1 LOB requiring mod A  bending to set down cone, forward step over it  "My eyes feel crazy" occasional min A required for balance   Sitting belly breathing with self feedback (1 hand on belly, 1 on chest)        HOME EXERCISE PROGRAM Last updated: 03/31/23 Access Code: [email or web address] URL: [email or web address] Date: 03/31/2023 Prepared by: [facility] - Outpatient  Rehab - [facility]  Exercises - Seated Shoulder Rolls  - 1 x daily - 5 x weekly - 2 sets - 10 reps - Seated Scapular Retraction  - 1 x daily - 5 x weekly - 2 sets - 10 reps - 3 sec hold - Seated Cervical Rotation AROM  - 1 x daily - 5 x weekly - 2 sets - 10 reps - Gentle Levator Scapulae Stretch  - 1 x daily - 5 x weekly - 2 sets - 30  sec hold - Forward T with Counter Support  - 1 x daily - 7 x weekly - 2-3 sets - 5 reps - Standing Gaze Stabilization with Head Rotation  - 1 x daily - 5 x weekly - 2-3 sets - 30 sec hold - Standing Gaze Stabilization with Head Nod  - 1 x daily - 5 x weekly - 2-3 sets - 30 sec hold - Corner Balance Feet Together With Eyes Open  - 1 x daily - 7 x weekly - 3 sets - 30 sec hold - Corner Balance Feet Together With Eyes Closed  - 1 x daily - 7 x weekly - 3 sets - 30 sec hold - Corner Balance Feet Together: Eyes Open With Head Turns  - 1 x daily - 7 x weekly - 3 sets - 3 reps - Corner Balance Feet Together: Eyes Closed With Head Turns  - 1 x daily - 7 x weekly - 3 sets - 3 reps   PATIENT EDUCATION: Education details: HEP update with HEP for safety Person educated: Patient Education method: Explanation, Demonstration, Tactile cues, Verbal cues, and Handouts Education comprehension: verbalized understanding and returned demonstration    Below measures were taken at time of initial evaluation unless otherwise specified:   DIAGNOSTIC FINDINGS: none recent (pt did not have imaging after her falls)  COGNITION: Overall cognitive status: Within functional limits for tasks assessed   SENSATION: Report L lateral 2 toes N/T occasionally, otherwise intact    Cervical ROM:    Active AROM (deg) eval  Flexion 35  Extension 54  Right lateral flexion 25 * pain over L cervical paraspinals   Left lateral flexion 25 * pain over L cervical paraspinals   Right rotation 54  Left rotation 57 *c/o tight  (Blank rows = not tested)  GAIT: Gait pattern: guarded and slightly unsteady  Assistive device utilized: None Level of assistance: SBA   PATIENT SURVEYS:  FOTO 52, 64  VESTIBULAR ASSESSMENT:  GENERAL OBSERVATION: pt wears reading glasses   OCULOMOTOR EXAM:  Ocular Alignment: normal  Ocular ROM: No Limitations  Spontaneous Nystagmus: absent  Gaze-Induced Nystagmus: absent  Smooth  Pursuits:  several saccades vertically, a couple horizontally   Saccades: intact  Convergence/Divergence: 4 cm limited by L eye  VESTIBULAR - OCULAR REFLEX:   Slow VOR: difficulty focusing and c/o feeling unsteady horizontal, c/o neck discomfort vertical  VOR Cancellation: Normal; c/o dizziness  Head-Impulse Test: HIT Right: negative HIT Left: negative *this test caused an episode of retropulsion d/t pt feeling unsteady   POSITIONAL TESTING:  Right Roll Test: negative, dizziness upon sitting up Left Roll Test: negative, dizziness upon sitting up Right Dix-Hallpike: negative Left Dix-Hallpike: negative, dizziness upon sitting up Right Sidelying: negative Left Sidelying: negative, dizziness upon sitting up      GOALS: Goals reviewed with patient? Yes  SHORT TERM GOALS: Target date: 03/31/2023  Patient to be independent with initial HEP. Baseline: HEP initiated Goal status: MET    LONG TERM GOALS: Target date: 04/21/2023  Patient to be independent with advanced HEP. Baseline: Not yet initiated  Goal status: IN PROGRESS  Patient to report 0/10 dizziness with standing vertical and horizontal VOR for 30 seconds. Baseline: Unable Goal status: IN PROGRESS  Patient will report 0/10 dizziness with bed mobility.  Baseline: Symptomatic  Goal status: IN PROGRESS  Patient to score at least 20/24 on DGI in order to decrease risk of falls. Baseline: 22/24 Goal status: MET  Patient to score at least 52 on FOTO in order to indicate improved functional outcomes.  Baseline: 64 Goal status: IN PROGRESS  Patient to report no falls in the past 4 weeks. Baseline: - Goal status: IN PROGRESS     ASSESSMENT:  CLINICAL IMPRESSION: Pt notes positional dizziness has not returned and is mostly experiencing motion sensitivity and feeling of "wooziness" with arising from sitting/supine and notes momentary disruption in balance.  Large/fast head/trunk movements also seem to perturb.   Difficulty with coordination in walking VOR requiring auditory cues to control sequence and notes symptoms provoked to 4/10.  No issue with fast 180 degree turns experienced.  Severe sway/LOB with condition 4 M-CTSIB and especially with head turns. Able to meet initial STG of HEP independence  OBJECTIVE IMPAIRMENTS: Abnormal gait, decreased activity tolerance, decreased balance, decreased ROM, dizziness, and pain.   ACTIVITY LIMITATIONS: carrying, lifting, bending, sitting, standing, squatting, sleeping, stairs, transfers, bed mobility, bathing, dressing, reach over head, hygiene/grooming, and locomotion level  PARTICIPATION LIMITATIONS: meal prep, cleaning, laundry, driving, shopping, community activity, occupation, and church  PERSONAL FACTORS: Age, Past/current experiences, Time since onset of injury/illness/exacerbation, and 3+ comorbidities: Anxiety, asthma, CHF, CAD, depression, DM, HTN, breast CA  are also affecting patient's functional outcome.   REHAB POTENTIAL: Good  CLINICAL DECISION MAKING: Evolving/moderate complexity  EVALUATION COMPLEXITY: Moderate   PLAN:  PT FREQUENCY: 1-2x/week  PT DURATION: 6 weeks  PLANNED INTERVENTIONS: Therapeutic exercises, Therapeutic activity, Neuromuscular re-education, Balance training, Gait training, Patient/Family education, Self Care, Joint mobilization, Stair training, Vestibular training, Canalith repositioning, DME instructions, Aquatic Therapy, Dry Needling, Electrical stimulation, Cryotherapy, Moist heat, Taping, Manual therapy, and Re-evaluation  PLAN FOR NEXT SESSION: walking VOR, corner balance review, continue with multisensory balance activities and habituation  3:27 PM, 04/05/23 M. Shary Decamp, PT, DPT Physical Therapist- [place] Office Number: [phone]

## 2023-04-05 NOTE — Progress Notes (Signed)
Subjective:   Felicia Perry is a 78 y.o. female who presents for Medicare Annual (Subsequent) preventive examination.  Patient Medicare AWV questionnaire was completed by the patient on 04/04/23; I have confirmed that all information answered by patient is correct and no changes since this date.     Review of Systems     Cardiac Risk Factors include: advanced age (>65men, >77 women);diabetes mellitus;hypertension;obesity (BMI >30kg/m2);dyslipidemia     Objective:    Today's Vitals   04/05/23 1555 04/05/23 1612  BP: (!) 146/64 128/64  Pulse: 71   Temp: 97.7 F (36.5 C)   SpO2: 97%   Weight: 158 lb 12.8 oz (72 kg)    Body mass index is 31.01 kg/m.     04/05/2023    4:06 PM 03/10/2023    4:20 PM 09/25/2022    5:58 AM 05/04/2022   11:13 AM 02/23/2022    7:50 AM 01/14/2022    1:08 PM 09/29/2021    8:59 AM  Advanced Directives  Does Patient Have a Medical Advance Directive? Yes Yes Yes No No No No  Type of Estate agent of [place];Living will  Healthcare Power of Attorney      Does patient want to make changes to medical advance directive?   No - Patient declined      Copy of Healthcare Power of Attorney in Chart? No - copy requested        Would patient like information on creating a medical advance directive?    No - Patient declined No - Patient declined  Yes (MAU/Ambulatory/Procedural Areas - Information given)    Current Medications (verified) Outpatient Encounter Medications as of 04/05/2023  Medication Sig   acetaminophen (TYLENOL) 325 MG tablet Take 650 mg by mouth every 6 (six) hours as needed for mild pain, moderate pain or headache.   albuterol (VENTOLIN HFA) 108 (90 Base) MCG/ACT inhaler Inhale 2 puffs into the lungs every 6 (six) hours as needed for wheezing or shortness of breath.   ALPRAZolam (XANAX) 1 MG tablet Take 1 tablet (1 mg total) by mouth at bedtime. DO NOT DRIVE FOR 8 HOURS AFTER TAKING   amLODipine (NORVASC) 10 MG tablet Take 1  tablet (10 mg total) by mouth daily.   anastrozole (ARIMIDEX) 1 MG tablet TAKE ONE TABLET DAILY   aspirin EC 81 MG tablet Take 1 tablet (81 mg total) by mouth daily. Swallow whole.   Bempedoic Acid-Ezetimibe (NEXLIZET) 180-10 MG TABS Take 180 mg by mouth daily.   budesonide-formoterol (SYMBICORT) 160-4.5 MCG/ACT inhaler Inhale 2 puffs into the lungs 2 (two) times daily.   carvedilol (COREG) 25 MG tablet Take 1 tablet (25 mg total) by mouth 2 (two) times daily with a meal.   famotidine (PEPCID) 20 MG tablet TAKE ONE TABLET TWICE DAILY   irbesartan (AVAPRO) 300 MG tablet Take 1 tablet (300 mg total) by mouth daily.   levocetirizine (XYZAL) 5 MG tablet Take 1 tablet (5 mg total) by mouth every morning.   omega-3 acid ethyl esters (LOVAZA) 1 g capsule TAKE TWO CAPSULES EVERY DAY   spironolactone (ALDACTONE) 25 MG tablet Take 0.5 tablets (12.5 mg total) by mouth daily.   venlafaxine XR (EFFEXOR-XR) 75 MG 24 hr capsule TAKE ONE CAPSULE EACH DAY WITH BREAKFAST   [DISCONTINUED] ondansetron (ZOFRAN-ODT) 8 MG disintegrating tablet Take 1 tablet (8 mg total) by mouth every 8 (eight) hours as needed for nausea or vomiting. (Patient not taking: Reported on 03/10/2023)   No facility-administered encounter medications  on file as of 04/05/2023.    Allergies (verified) Penicillins, Cephalosporins, Citalopram hydrobromide, Rosuvastatin, Zolpidem tartrate, Clarithromycin, and Levofloxacin   History: Past Medical History:  Diagnosis Date   Adenomatous polyp 11/23/2005   Anxiety    01/09/22- patient denies anxiety   Asthma 2003   Basal cell carcinoma 2021   right cheek per patient at [place] [facility]   Carotid stenosis    Chronic diastolic CHF (congestive heart failure) (HCC) 2017   Echo 2021 was normal   COPD (chronic obstructive pulmonary disease) (HCC)    Coronary artery disease 2017   a. HC on 08/14/16 showed RCA CTO s/p PCI, 60% LCX disease managed medically with recs to consider staged PCI if  continued symptoms.    Depression 2008   pt reports MD diagnosed but she does not feel depressed   Diabetes mellitus without complication (HCC)    DIVERTICULOSIS, COLON 04/22/2007        GERD (gastroesophageal reflux disease) 2012   History of radiation therapy    Right breast- 03/04/22-04/02/22- Dr. Antony Blackbird   History of shingles    Hypertension 2008   Hypertensive heart disease    Insomnia 2003   Internal hemorrhoids    Osteoarthritis 2008   "knees, hands, lower back" (08/14/2016)   Pap smear for cervical cancer screening 2002   results normal   Personal history of radiation therapy    PUD (peptic ulcer disease) 1989   Seasonal allergies 2013   Sleep apnea    a. resolved with weight loss   Past Surgical History:  Procedure Laterality Date   BREAST BIOPSY Right 12/30/2021   BREAST EXCISIONAL BIOPSY Left    1980's x 3 - all benign   BREAST EXCISIONAL BIOPSY Right    1980's x 1 or 2 - all benign   BREAST LUMPECTOMY Right 01/22/2022   BREAST LUMPECTOMY WITH RADIOACTIVE SEED LOCALIZATION Right 01/22/2022   Procedure: RIGHT BREAST LUMPECTOMY WITH RADIOACTIVE SEED LOCALIZATION;  Surgeon: Harriette Bouillon, MD;  Location: [facility];  Service: General;  Laterality: Right;   BREAST SURGERY Left 1980s   Fibrous Tumors removed    CARDIAC CATHETERIZATION N/A 08/14/2016   Procedure: Left Heart Cath and Coronary Angiography;  Surgeon: Tonny Bollman, MD;  Location: [facility] INVASIVE CV LAB;  Service: Cardiovascular;  Laterality: N/A;   CARDIAC CATHETERIZATION N/A 08/14/2016   Procedure: Coronary Stent Intervention;  Surgeon: Tonny Bollman, MD;  Location: [facility] INVASIVE CV LAB;  Service: Cardiovascular;  Laterality: N/A;   CATARACT EXTRACTION W/ INTRAOCULAR LENS  IMPLANT, BILATERAL Bilateral 05/2009   COLONOSCOPY N/A 2016   Cologuard 2019   CORONARY ANGIOPLASTY     detached retina left eye     may 2019   LEFT HEART CATH AND CORONARY ANGIOGRAPHY N/A 09/25/2022   Procedure:  LEFT HEART CATH AND CORONARY ANGIOGRAPHY;  Surgeon: Kathleene Hazel, MD;  Location: [facility] INVASIVE CV LAB;  Service: Cardiovascular;  Laterality: N/A;   TUBAL LIGATION  1970s   Family History  Problem Relation Age of Onset   Heart disease Mother    Hypertension Mother    Diabetes Mother    Dementia Mother    COPD Father    Dementia Maternal Grandmother    Colon cancer Neg Hx    Social History   Socioeconomic History   Marital status: Divorced    Spouse name: Not on file   Number of children: Not on file   Years of education: Not on file   Highest education level:  Not on file  Occupational History   Occupation: Cleaning  Tobacco Use   Smoking status: Former    Packs/day: 1.00    Years: 26.00    Additional pack years: 0.00    Total pack years: 26.00    Types: Cigarettes    Quit date: 1989    Years since quitting: 35.4   Smokeless tobacco: Never  Vaping Use   Vaping Use: Never used  Substance and Sexual Activity   Alcohol use: No   Drug use: No   Sexual activity: Not Currently  Other Topics Concern   Not on file  Social History Narrative   Divorced for 38 years in 2016. 2 sons- 1 son had been in prison now living with her in 2019.  2 granddaughters with 1 living close.        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 Best friend Fannie Knee patient of Dr. Durene Cal. Enjoys work, time on Animator.    Social Determinants of Health   Financial Resource Strain: Medium Risk (04/04/2023)   Overall Financial Resource Strain (CARDIA)    Difficulty of Paying Living Expenses: Somewhat hard  Food Insecurity: No Food Insecurity (04/04/2023)   Hunger Vital Sign    Worried About Running Out of Food in the Last Year: Never true    Ran Out of Food in the Last Year: Never true  Transportation Needs: No Transportation Needs (04/04/2023)   PRAPARE -  Administrator, Civil Service (Medical): No    Lack of Transportation (Non-Medical): No  Physical Activity: Insufficiently Active (04/04/2023)   Exercise Vital Sign    Days of Exercise per Week: 2 days    Minutes of Exercise per Session: 50 min  Stress: Patient Declined (04/04/2023)   [name] of Occupational Health - Occupational Stress Questionnaire    Feeling of Stress : Patient declined  Social Connections: Moderately Integrated (04/04/2023)   Social Connection and Isolation Panel [NHANES]    Frequency of Communication with Friends and Family: More than three times a week    Frequency of Social Gatherings with Friends and Family: Three times a week    Attends Religious Services: More than 4 times per year    Active Member of Clubs or Organizations: Yes    Attends Engineer, structural: More than 4 times per year    Marital Status: Divorced    Tobacco Counseling Counseling given: Not Answered   Clinical Intake:  Pre-visit preparation completed: Yes  Pain : No/denies pain     BMI - recorded: 31.01 Nutritional Status: BMI > 30  Obese Nutritional Risks: None Diabetes: Yes CBG done?: No CBG resulted in Enter/ Edit results?: No Did pt. bring in CBG monitor from home?: No  How often do you need to have someone help you when you read instructions, pamphlets, or other written materials from your doctor or pharmacy?: 1 - Never  Diabetic?Nutrition Risk Assessment:  Has the patient had any N/V/D within the last 2 months?  Yes  with head injury  Does the patient have any non-healing wounds?  No  Has the patient had any unintentional weight loss or weight gain?  No   Diabetes:  Is the patient diabetic?  Yes  If diabetic, was a CBG obtained today?  No  Did the patient bring in their glucometer  from home?  No  How often do you monitor your CBG's? N/a.   Financial Strains and Diabetes Management:  Are you having any financial strains with the device,  your supplies or your medication? No .  Does the patient want to be seen by Chronic Care Management for management of their diabetes?  No  Would the patient like to be referred to a Nutritionist or for Diabetic Management?  No   Diabetic Exams:  Diabetic Eye Exam: Overdue for diabetic eye exam. Pt has been advised about the importance in completing this exam. Patient advised to call and schedule an eye exam. Diabetic Foot Exam: Completed 11/12/22  Interpreter Needed?: No  Information entered by :: Lanier Ensign, LPN   Activities of Daily Living    04/04/2023    7:36 PM  In your present state of health, do you have any difficulty performing the following activities:  Hearing? 0  Vision? 1  Difficulty concentrating or making decisions? 0  Walking or climbing stairs? 0  Dressing or bathing? 0  Doing errands, shopping? 0  Preparing Food and eating ? N  Using the Toilet? N  In the past six months, have you accidently leaked urine? N  Do you have problems with loss of bowel control? N  Managing your Medications? N  Managing your Finances? N  Housekeeping or managing your Housekeeping? N    Patient Care Team: Shelva Majestic, MD as PCP - General (Family Medicine) Meriam Sprague, MD as PCP - Cardiology (Cardiology) Judi Saa, DO as Consulting Physician (Sports Medicine) Suzi Roots as Physician Assistant (Dermatology) Erroll Luna, [facility] as Pharmacist (Pharmacist) Harriette Bouillon, MD as Consulting Physician (General Surgery) Rachel Moulds, MD as Consulting Physician (Hematology and Oncology) Antony Blackbird, MD as Consulting Physician (Radiation Oncology)  Indicate any recent Medical Services you may have received from other than [facility] providers in the past year (date may be approximate).     Assessment:   This is a routine wellness examination for Felicia Perry.  Hearing/Vision screen Hearing Screening - Comments:: Pt denies any hearing issues   Vision Screening - Comments:: Pt follows up with dr Genia Del for annual eye exams   Dietary issues and exercise activities discussed: Current Exercise Habits: Structured exercise class, Type of exercise: Other - see comments (PT), Time (Minutes): 45, Frequency (Times/Week): 2, Weekly Exercise (Minutes/Week): 90   Goals Addressed             This Visit's Progress    Patient Stated       Get blood pressure down and no falls        Depression Screen    04/05/2023    4:04 PM 11/12/2022    3:11 PM 09/29/2021    8:56 AM 08/12/2021   10:44 AM 02/05/2021    7:58 AM 08/16/2020    8:54 AM 08/07/2020    8:20 AM  PHQ 2/9 Scores  PHQ - 2 Score 0 1 0 0 0 0 0  PHQ- 9 Score  1   6  6     Fall Risk    04/04/2023    7:36 PM 11/12/2022    3:10 PM 09/29/2021    9:00 AM 08/12/2021   10:44 AM 02/05/2021    7:57 AM  Fall Risk   Falls in the past year? 1 0 0 0 0  Number falls in past yr: 1 0 0 0 0  Injury with Fall? 1 1 0 0 0  Comment head  Risk for fall due to : History of fall(s);Impaired vision;Impaired balance/gait No Fall Risks Impaired vision No Fall Risks   Follow up Falls prevention discussed Falls evaluation completed Falls prevention discussed Falls evaluation completed     FALL RISK PREVENTION PERTAINING TO THE HOME:  Any stairs in or around the home? Yes  If so, are there any without handrails? No  Home free of loose throw rugs in walkways, pet beds, electrical cords, etc? Yes  Adequate lighting in your home to reduce risk of falls? Yes   ASSISTIVE DEVICES UTILIZED TO PREVENT FALLS:  Life alert? No  Use of a cane, walker or w/c? No  Grab bars in the bathroom? Yes  Shower chair or bench in shower? Yes  Elevated toilet seat or a handicapped toilet? Yes   TIMED UP AND GO:  Was the test performed? Yes .  Length of time to ambulate 10 feet: 10 sec.   Gait steady and fast without use of assistive device  Cognitive Function:    07/25/2019   10:12 AM  MMSE - Mini  Mental State Exam  Orientation to time 5  Orientation to Place 5  Registration 3  Attention/ Calculation 5  Recall 3  Language- name 2 objects 2  Language- repeat 1  Language- follow 3 step command 3  Language- read & follow direction 1  Write a sentence 1  Copy design 1  Total score 30        04/05/2023    4:10 PM 09/29/2021    9:02 AM 08/16/2020    9:06 AM  6CIT Screen  What Year? 0 points 0 points 0 points  What month? 0 points 0 points 0 points  What time? 0 points 0 points   Count back from 20 0 points 0 points 0 points  Months in reverse 0 points 0 points 0 points  Repeat phrase 0 points 0 points 0 points  Total Score 0 points 0 points     Immunizations Immunization History  Administered Date(s) Administered   Fluad Quad(high Dose 65+) 07/25/2019, 08/07/2020, 08/12/2021, 11/12/2022   Influenza Split 09/21/2011, 07/07/2012   Influenza Whole 08/10/2008, 07/30/2010   Influenza, High Dose Seasonal PF 08/04/2016, 07/05/2018   Influenza,inj,Quad PF,6+ Mos 07/21/2013, 08/06/2015   Influenza-Unspecified 07/10/2014, 10/01/2017   PFIZER(Purple Top)SARS-COV-2 Vaccination 12/26/2019, 01/17/2020   Pneumococcal Conjugate-13 01/05/2014   Pneumococcal Polysaccharide-23 02/08/2015   Td 10/26/2001   Tetanus 01/05/2014    TDAP status: Up to date  Flu Vaccine status: Up to date  Pneumococcal vaccine status: Up to date  Covid-19 vaccine status: Completed vaccines  Qualifies for Shingles Vaccine? Yes   Zostavax completed No   Shingrix Completed?: No.    Education has been provided regarding the importance of this vaccine. Patient has been advised to call insurance company to determine out of pocket expense if they have not yet received this vaccine. Advised may also receive vaccine at local pharmacy or Health Dept. Verbalized acceptance and understanding.  Screening Tests Health Maintenance  Topic Date Due   Zoster Vaccines- Shingrix (1 of 2) Never done   COVID-19 Vaccine  (3 - Pfizer risk series) 02/14/2020   OPHTHALMOLOGY EXAM  12/26/2022   HEMOGLOBIN A1C  05/13/2023   INFLUENZA VACCINE  05/27/2023   Diabetic kidney evaluation - eGFR measurement  11/13/2023   Diabetic kidney evaluation - Urine ACR  11/13/2023   FOOT EXAM  11/13/2023   MAMMOGRAM  11/19/2023   DTaP/Tdap/Td (3 - Tdap) 01/06/2024   Medicare Annual  Wellness (AWV)  04/04/2024   DEXA SCAN  08/18/2024   Pneumonia Vaccine 61+ Years old  Completed   Hepatitis C Screening  Completed   HPV VACCINES  Aged Out   Colonoscopy  Discontinued   Fecal DNA (Cologuard)  Discontinued    Health Maintenance  Health Maintenance Due  Topic Date Due   Zoster Vaccines- Shingrix (1 of 2) Never done   COVID-19 Vaccine (3 - Pfizer risk series) 02/14/2020   OPHTHALMOLOGY EXAM  12/26/2022    Colorectal cancer screening: Type of screening: Cologuard. Completed 11/23/05. Repeat every 3 years  Mammogram status: Completed 11/18/22. Repeat every year  Bone Density status: Completed 08/18/22. Results reflect: Bone density results: OSTEOPENIA. Repeat every 3 years.    Additional Screening:  Hepatitis C Screening:  Completed 07/1016  Vision Screening: Recommended annual ophthalmology exams for early detection of glaucoma and other disorders of the eye. Is the patient up to date with their annual eye exam?  No  Who is the provider or what is the name of the office in which the patient attends annual eye exams? Dr Genia Del  If pt is not established with a provider, would they like to be referred to a provider to establish care? No .   Dental Screening: Recommended annual dental exams for proper oral hygiene  Community Resource Referral / Chronic Care Management: CRR required this visit?  No   CCM required this visit?  No      Plan:     I have personally reviewed and noted the following in the patient's chart:   Medical and social history Use of alcohol, tobacco or illicit drugs  Current medications and  supplements including opioid prescriptions. Patient is not currently taking opioid prescriptions. Functional ability and status Nutritional status Physical activity Advanced directives List of other physicians Hospitalizations, surgeries, and ER visits in previous 12 months Vitals Screenings to include cognitive, depression, and falls Referrals and appointments  In addition, I have reviewed and discussed with patient certain preventive protocols, quality metrics, and best practice recommendations. A written personalized care plan for preventive services as well as general preventive health recommendations were provided to patient.     Marzella Schlein, LPN   [id number]/61/0960   Nurse Notes: none

## 2023-04-05 NOTE — Patient Instructions (Signed)
Felicia Perry , Thank you for taking time to come for your Medicare Wellness Visit. I appreciate your ongoing commitment to your health goals. Please review the following plan we discussed and let me know if I can assist you in the future.   These are the goals we discussed:  Goals      Patient Stated     Will work another year Take time off and plan to take a week off q 4 months      Patient Stated     Lose 20lbs      Patient Stated     Lose 40 lbs     Set My Target A1C-Diabetes Type 2 < 7     Timeframe:  Long-Range Goal Priority:  High Start Date:  09/16/21                           Expected End Date: 03/16/22                      Follow Up Date 12/17/21    - set target A1C < 7    Why is this important?   Your target A1C is decided together by you and your doctor.  It is based on several things like your age and other health issues.    Notes:         This is a list of the screening recommended for you and due dates:  Health Maintenance  Topic Date Due   Zoster (Shingles) Vaccine (1 of 2) Never done   COVID-19 Vaccine (3 - Pfizer risk series) 02/14/2020   Eye exam for diabetics  12/26/2022   Hemoglobin A1C  05/13/2023   Flu Shot  05/27/2023   Yearly kidney function blood test for diabetes  11/13/2023   Yearly kidney health urinalysis for diabetes  11/13/2023   Complete foot exam   11/13/2023   Mammogram  11/19/2023   DTaP/Tdap/Td vaccine (3 - Tdap) 01/06/2024   Medicare Annual Wellness Visit  04/04/2024   DEXA scan (bone density measurement)  08/18/2024   Pneumonia Vaccine  Completed   Hepatitis C Screening  Completed   HPV Vaccine  Aged Out   Colon Cancer Screening  Discontinued   Cologuard (Stool DNA test)  Discontinued    Advanced directives: Please bring a copy of your health care power of attorney and living will to the office at your convenience.  Conditions/risks identified: blood pressure down and no falls   Next appointment: Follow up in one year  for your annual wellness visit    Preventive Care 65 Years and Older, Female Preventive care refers to lifestyle choices and visits with your health care provider that can promote health and wellness. What does preventive care include? A yearly physical exam. This is also called an annual well check. Dental exams once or twice a year. Routine eye exams. Ask your health care provider how often you should have your eyes checked. Personal lifestyle choices, including: Daily care of your teeth and gums. Regular physical activity. Eating a healthy diet. Avoiding tobacco and drug use. Limiting alcohol use. Practicing safe sex. Taking low-dose aspirin every day. Taking vitamin and mineral supplements as recommended by your health care provider. What happens during an annual well check? The services and screenings done by your health care provider during your annual well check will depend on your age, overall health, lifestyle risk factors, and family history of disease.  Counseling  Your health care provider may ask you questions about your: Alcohol use. Tobacco use. Drug use. Emotional well-being. Home and relationship well-being. Sexual activity. Eating habits. History of falls. Memory and ability to understand (cognition). Work and work Astronomer. Reproductive health. Screening  You may have the following tests or measurements: Height, weight, and BMI. Blood pressure. Lipid and cholesterol levels. These may be checked every 5 years, or more frequently if you are over 52 years old. Skin check. Lung cancer screening. You may have this screening every year starting at age 60 if you have a 30-pack-year history of smoking and currently smoke or have quit within the past 15 years. Fecal occult blood test (FOBT) of the stool. You may have this test every year starting at age 51. Flexible sigmoidoscopy or colonoscopy. You may have a sigmoidoscopy every 5 years or a colonoscopy every 10  years starting at age 51. Hepatitis C blood test. Hepatitis B blood test. Sexually transmitted disease (STD) testing. Diabetes screening. This is done by checking your blood sugar (glucose) after you have not eaten for a while (fasting). You may have this done every 1-3 years. Bone density scan. This is done to screen for osteoporosis. You may have this done starting at age 50. Mammogram. This may be done every 1-2 years. Talk to your health care provider about how often you should have regular mammograms. Talk with your health care provider about your test results, treatment options, and if necessary, the need for more tests. Vaccines  Your health care provider may recommend certain vaccines, such as: Influenza vaccine. This is recommended every year. Tetanus, diphtheria, and acellular pertussis (Tdap, Td) vaccine. You may need a Td booster every 10 years. Zoster vaccine. You may need this after age 51. Pneumococcal 13-valent conjugate (PCV13) vaccine. One dose is recommended after age 70. Pneumococcal polysaccharide (PPSV23) vaccine. One dose is recommended after age 73. Talk to your health care provider about which screenings and vaccines you need and how often you need them. This information is not intended to replace advice given to you by your health care provider. Make sure you discuss any questions you have with your health care provider. Document Released: 11/08/2015 Document Revised: 07/01/2016 Document Reviewed: 08/13/2015 Elsevier Interactive Patient Education  2017 [employer].  Fall Prevention in the Home Falls can cause injuries. They can happen to people of all ages. There are many things you can do to make your home safe and to help prevent falls. What can I do on the outside of my home? Regularly fix the edges of walkways and driveways and fix any cracks. Remove anything that might make you trip as you walk through a door, such as a raised step or threshold. Trim any  bushes or trees on the path to your home. Use bright outdoor lighting. Clear any walking paths of anything that might make someone trip, such as rocks or tools. Regularly check to see if handrails are loose or broken. Make sure that both sides of any steps have handrails. Any raised decks and porches should have guardrails on the edges. Have any leaves, snow, or ice cleared regularly. Use sand or salt on walking paths during winter. Clean up any spills in your garage right away. This includes oil or grease spills. What can I do in the bathroom? Use night lights. Install grab bars by the toilet and in the tub and shower. Do not use towel bars as grab bars. Use non-skid mats or decals in  the tub or shower. If you need to sit down in the shower, use a plastic, non-slip stool. Keep the floor dry. Clean up any water that spills on the floor as soon as it happens. Remove soap buildup in the tub or shower regularly. Attach bath mats securely with double-sided non-slip rug tape. Do not have throw rugs and other things on the floor that can make you trip. What can I do in the bedroom? Use night lights. Make sure that you have a light by your bed that is easy to reach. Do not use any sheets or blankets that are too big for your bed. They should not hang down onto the floor. Have a firm chair that has side arms. You can use this for support while you get dressed. Do not have throw rugs and other things on the floor that can make you trip. What can I do in the kitchen? Clean up any spills right away. Avoid walking on wet floors. Keep items that you use a lot in easy-to-reach places. If you need to reach something above you, use a strong step stool that has a grab bar. Keep electrical cords out of the way. Do not use floor polish or wax that makes floors slippery. If you must use wax, use non-skid floor wax. Do not have throw rugs and other things on the floor that can make you trip. What can I do  with my stairs? Do not leave any items on the stairs. Make sure that there are handrails on both sides of the stairs and use them. Fix handrails that are broken or loose. Make sure that handrails are as long as the stairways. Check any carpeting to make sure that it is firmly attached to the stairs. Fix any carpet that is loose or worn. Avoid having throw rugs at the top or bottom of the stairs. If you do have throw rugs, attach them to the floor with carpet tape. Make sure that you have a light switch at the top of the stairs and the bottom of the stairs. If you do not have them, ask someone to add them for you. What else can I do to help prevent falls? Wear shoes that: Do not have high heels. Have rubber bottoms. Are comfortable and fit you well. Are closed at the toe. Do not wear sandals. If you use a stepladder: Make sure that it is fully opened. Do not climb a closed stepladder. Make sure that both sides of the stepladder are locked into place. Ask someone to hold it for you, if possible. Clearly mark and make sure that you can see: Any grab bars or handrails. First and last steps. Where the edge of each step is. Use tools that help you move around (mobility aids) if they are needed. These include: Canes. Walkers. Scooters. Crutches. Turn on the lights when you go into a dark area. Replace any light bulbs as soon as they burn out. Set up your furniture so you have a clear path. Avoid moving your furniture around. If any of your floors are uneven, fix them. If there are any pets around you, be aware of where they are. Review your medicines with your doctor. Some medicines can make you feel dizzy. This can increase your chance of falling. Ask your doctor what other things that you can do to help prevent falls. This information is not intended to replace advice given to you by your health care provider. Make sure you discuss any  questions you have with your health care  provider. Document Released: 08/08/2009 Document Revised: 03/19/2016 Document Reviewed: 11/16/2014 Elsevier Interactive Patient Education  2017 [employer].

## 2023-04-07 ENCOUNTER — Ambulatory Visit: Payer: Medicare Other | Admitting: Physical Therapy

## 2023-04-09 NOTE — Therapy (Signed)
OUTPATIENT PHYSICAL THERAPY VESTIBULAR TREATMENT     Patient Name: Felicia Perry MRN: [id number] DOB:[birth year], 77 y.o., female Today's Date: 04/12/2023  END OF SESSION:  PT End of Session - 04/12/23 1519     Visit Number 8    Number of Visits 13    Date for PT Re-Evaluation 04/21/23    Authorization Type BCBS Medicare    PT Start Time 1440    PT Stop Time 1518    PT Time Calculation (min) 38 min    Equipment Utilized During Treatment Gait belt    Activity Tolerance Patient tolerated treatment well    Behavior During Therapy WFL for tasks assessed/performed                Past Medical History:  Diagnosis Date   Adenomatous polyp 11/23/2005   Anxiety    01/09/22- patient denies anxiety   Asthma 2003   Basal cell carcinoma 2021   right cheek per patient at [facility] [facility]   Carotid stenosis    Chronic diastolic CHF (congestive heart failure) (HCC) 2017   Echo 2021 was normal   COPD (chronic obstructive pulmonary disease) (HCC)    Coronary artery disease 2017   a. HC on 08/14/16 showed RCA CTO s/p PCI, 60% LCX disease managed medically with recs to consider staged PCI if continued symptoms.    Depression 2008   pt reports MD diagnosed but she does not feel depressed   Diabetes mellitus without complication (HCC)    DIVERTICULOSIS, COLON 04/22/2007        GERD (gastroesophageal reflux disease) 2012   History of radiation therapy    Right breast- 03/04/22-04/02/22- Dr. Antony Blackbird   History of shingles    Hypertension 2008   Hypertensive heart disease    Insomnia 2003   Internal hemorrhoids    Osteoarthritis 2008   "knees, hands, lower back" (08/14/2016)   Pap smear for cervical cancer screening 2002   results normal   Personal history of radiation therapy    PUD (peptic ulcer disease) 1989   Seasonal allergies 2013   Sleep apnea    a. resolved with weight loss   Past Surgical History:  Procedure Laterality Date   BREAST BIOPSY Right 12/30/2021    BREAST EXCISIONAL BIOPSY Left    1980's x 3 - all benign   BREAST EXCISIONAL BIOPSY Right    1980's x 1 or 2 - all benign   BREAST LUMPECTOMY Right 01/22/2022   BREAST LUMPECTOMY WITH RADIOACTIVE SEED LOCALIZATION Right 01/22/2022   Procedure: RIGHT BREAST LUMPECTOMY WITH RADIOACTIVE SEED LOCALIZATION;  Surgeon: Harriette Bouillon, MD;  Location: [facility];  Service: General;  Laterality: Right;   BREAST SURGERY Left 1980s   Fibrous Tumors removed    CARDIAC CATHETERIZATION N/A 08/14/2016   Procedure: Left Heart Cath and Coronary Angiography;  Surgeon: Tonny Bollman, MD;  Location: [facility] INVASIVE CV LAB;  Service: Cardiovascular;  Laterality: N/A;   CARDIAC CATHETERIZATION N/A 08/14/2016   Procedure: Coronary Stent Intervention;  Surgeon: Tonny Bollman, MD;  Location: [facility] INVASIVE CV LAB;  Service: Cardiovascular;  Laterality: N/A;   CATARACT EXTRACTION W/ INTRAOCULAR LENS  IMPLANT, BILATERAL Bilateral 05/2009   COLONOSCOPY N/A 2016   Cologuard 2019   CORONARY ANGIOPLASTY     detached retina left eye     may 2019   LEFT HEART CATH AND CORONARY ANGIOGRAPHY N/A 09/25/2022   Procedure: LEFT HEART CATH AND CORONARY ANGIOGRAPHY;  Surgeon: Kathleene Hazel, MD;  Location: [facility]  INVASIVE CV LAB;  Service: Cardiovascular;  Laterality: N/A;   TUBAL LIGATION  1970s   Patient Active Problem List   Diagnosis Date Noted   Shortness of breath 09/25/2022   Genetic testing 01/19/2022   Ductal carcinoma in situ (DCIS) of right breast 01/05/2022   Diabetes mellitus type 2 with complications (HCC) 02/05/2021   Intractable headache 01/02/2021   Greater trochanteric bursitis of left hip 08/13/2020   Senile purpura (HCC) 08/07/2020   statin myalgia 06/25/2020   Carotid stenosis 01/23/2020   Chronic diastolic CHF (congestive heart failure) (HCC) 01/12/2019   Degenerative arthritis of knee, bilateral 12/08/2018   Partial hamstring tear, initial encounter 11/16/2018   Hyperlipidemia,  unspecified 07/11/2018   Nonallopathic lesion of sacral region 07/05/2018   Nonallopathic lesion of cervical region 07/05/2018   DDD (degenerative disc disease), cervical 05/04/2018   Degenerative arthritis of left knee 09/15/2017   H/O hyperparathyroidism 08/18/2016   Coronary artery disease involving native coronary artery of native heart without angina pectoris    PUD (peptic ulcer disease)    Syncope 08/04/2016   Vitamin D deficiency 08/20/2015   Hyperglycemia 08/06/2015   Xerosis of skin 08/06/2015   Hypersomnia with sleep apnea 03/26/2015   Chronic pain syndrome 03/26/2015   Nonallopathic lesion of lumbosacral region 11/13/2014   Osteoarthritis of left lower extremity 10/17/2014   Chronic meniscal tear of knee 10/17/2014   Nonallopathic lesion of thoracic region 09/14/2014   Nonallopathic lesion-rib cage 08/21/2014   Tendinopathy of right rotator cuff 07/30/2014   Piriformis syndrome of right side 07/30/2014   Insomnia 07/13/2014   Limited joint range of motion 07/13/2014   Rash 07/13/2014   GERD (gastroesophageal reflux disease) 02/25/2011   SI (sacroiliac) joint dysfunction 10/10/2007   LOW BACK PAIN 07/01/2007   Depression 04/22/2007   Essential hypertension 04/22/2007   Asthma 04/22/2007    PCP: Shelva Majestic, MD REFERRING PROVIDER: Judi Saa, DO  REFERRING DIAG: R42 (ICD-10-CM) - Dizziness R26.89 (ICD-10-CM) - Balance disorder   THERAPY DIAG:  Dizziness and giddiness  Unsteadiness on feet  ONSET DATE: 3-4 weeks   Rationale for Evaluation and Treatment: Rehabilitation  SUBJECTIVE:   SUBJECTIVE STATEMENT: "Not too bad. I worked really hard this AM."   Pt accompanied by: self  PERTINENT HISTORY: Anxiety, asthma, CHF, CAD, depression, DM, HTN, breast CA  PAIN:  Are you having pain? Yes: NPRS scale: 0/10 Pain location: B posterolateral neck Pain description: stiff Aggravating factors: AM Relieving factors: tylenol  PRECAUTIONS:  Fall  WEIGHT BEARING RESTRICTIONS: No  FALLS: Has patient fallen in last 6 months? Yes. 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 No dizziness or imbalance   1/2 turns to targets  Turning en bloc but good stability and tolerance  PATIENT EDUCATION: Education details: review and consolidation of HEP for max benefit/challenge; edu on POC Person educated: Patient Education method: Explanation, Demonstration, Tactile cues, Verbal cues, and Handouts Education comprehension: verbalized understanding and returned demonstration    HOME EXERCISE PROGRAM Last updated: 04/12/23 Access Code: [email or web address] URL: [email or web address] Date: 04/12/2023 Prepared by: [facility] - Outpatient  Rehab - [facility]  Exercises - Seated Shoulder Rolls  - 1 x daily - 5 x weekly - 2 sets - 10 reps - Seated Scapular Retraction  - 1 x daily - 5 x weekly - 2 sets - 10 reps - 3 sec hold - Seated Cervical Rotation AROM  - 1 x daily - 5 x weekly - 2 sets - 10 reps - Gentle Levator Scapulae Stretch  - 1 x daily - 5 x weekly - 2 sets - 30 sec hold - Corner Balance Feet Together: Eyes Open With Head Turns  - 1 x daily - 7 x weekly - 3 sets - 3 reps - Corner Balance Feet Together: Eyes Closed With Head  Turns  - 1 x daily - 7 x weekly - 3 sets - 3 reps - Gait with Head Nods and Turns on Grass  - 1 x daily - 5 x weekly - 2 sets - 5 reps   Below measures were taken at time of initial evaluation unless otherwise specified:   DIAGNOSTIC FINDINGS: none recent (pt did not have imaging after her falls)  COGNITION: Overall cognitive status: Within functional limits for tasks assessed   SENSATION: Report L lateral 2 toes N/T occasionally, otherwise intact    Cervical ROM:    Active AROM (deg) eval  Flexion 35  Extension 54  Right lateral flexion 25 * pain over L cervical paraspinals   Left lateral flexion 25 * pain over L cervical paraspinals   Right rotation 54  Left rotation 57 *c/o tight  (Blank rows = not tested)  GAIT: Gait pattern: guarded and slightly unsteady  Assistive device utilized: None Level of assistance: SBA   PATIENT SURVEYS:  FOTO 52, 64  VESTIBULAR ASSESSMENT:  GENERAL OBSERVATION: pt wears reading glasses   OCULOMOTOR EXAM:  Ocular Alignment: normal  Ocular ROM: No Limitations  Spontaneous Nystagmus: absent  Gaze-Induced Nystagmus: absent  Smooth Pursuits:  several saccades vertically, a couple horizontally   Saccades: intact  Convergence/Divergence: 4 cm limited by L eye  VESTIBULAR - OCULAR REFLEX:   Slow VOR: difficulty focusing and c/o feeling unsteady horizontal, c/o neck discomfort vertical  VOR Cancellation: Normal; c/o dizziness  Head-Impulse Test: HIT Right: negative HIT Left: negative *this test caused an episode of retropulsion d/t pt feeling unsteady   POSITIONAL TESTING:  Right Roll Test: negative, dizziness upon sitting up Left Roll Test: negative, dizziness upon sitting up Right Dix-Hallpike: negative Left Dix-Hallpike: negative, dizziness upon sitting up Right Sidelying: negative Left Sidelying: negative, dizziness upon sitting up      GOALS: Goals reviewed with patient? Yes  SHORT TERM GOALS: Target date:  03/31/2023  Patient to be independent with initial HEP. Baseline: HEP initiated Goal status: MET    LONG TERM GOALS: Target date: 04/21/2023  Patient to be independent with advanced HEP. Baseline: Not yet initiated  Goal status: IN PROGRESS  Patient to report 0/10 dizziness with standing vertical and horizontal VOR for 30 seconds. Baseline: Unable Goal status: IN PROGRESS  Patient will report 0/10 dizziness with bed mobility.  Baseline: Symptomatic  Goal status: IN PROGRESS  Patient to score at least 20/24  on DGI in order to decrease risk of falls. Baseline: 22/24 Goal status: MET  Patient to score at least 52 on FOTO in order to indicate improved functional outcomes.  Baseline: 64 Goal status: IN PROGRESS  Patient to report no falls in the past 4 weeks. Baseline: - Goal status: IN PROGRESS     ASSESSMENT:  CLINICAL IMPRESSION: Patient arrived to session without complaints. Worked on balance activities with head movements- patient without dizziness but occasional sway/LOB. However, stability overall is improved and patient is tolerating more challenging activities. 1 episode of dizziness and LOB was evident with gait with head nods, however this resolved in seconds. HEP was update and consolidated for max challenge. No complaints at end of session.   OBJECTIVE IMPAIRMENTS: Abnormal gait, decreased activity tolerance, decreased balance, decreased ROM, dizziness, and pain.   ACTIVITY LIMITATIONS: carrying, lifting, bending, sitting, standing, squatting, sleeping, stairs, transfers, bed mobility, bathing, dressing, reach over head, hygiene/grooming, and locomotion level  PARTICIPATION LIMITATIONS: meal prep, cleaning, laundry, driving, shopping, community activity, occupation, and church  PERSONAL FACTORS: Age, Past/current experiences, Time since onset of injury/illness/exacerbation, and 3+ comorbidities: Anxiety, asthma, CHF, CAD, depression, DM, HTN, breast CA  are also  affecting patient's functional outcome.   REHAB POTENTIAL: Good  CLINICAL DECISION MAKING: Evolving/moderate complexity  EVALUATION COMPLEXITY: Moderate   PLAN:  PT FREQUENCY: 1-2x/week  PT DURATION: 6 weeks  PLANNED INTERVENTIONS: Therapeutic exercises, Therapeutic activity, Neuromuscular re-education, Balance training, Gait training, Patient/Family education, Self Care, Joint mobilization, Stair training, Vestibular training, Canalith repositioning, DME instructions, Aquatic Therapy, Dry Needling, Electrical stimulation, Cryotherapy, Moist heat, Taping, Manual therapy, and Re-evaluation  PLAN FOR NEXT SESSION: walking VOR, corner balance review, continue with multisensory balance activities and habituation   Anette Guarneri, PT, DPT 04/12/23 3:21 PM  [facility] [address], [address] [place], [place] [address] Phone # [phone] Fax # [phone]

## 2023-04-12 ENCOUNTER — Ambulatory Visit: Payer: Medicare Other | Admitting: Physical Therapy

## 2023-04-12 ENCOUNTER — Encounter: Payer: Self-pay | Admitting: Physical Therapy

## 2023-04-12 DIAGNOSIS — R42 Dizziness and giddiness: Secondary | ICD-10-CM | POA: Diagnosis not present

## 2023-04-12 DIAGNOSIS — R2681 Unsteadiness on feet: Secondary | ICD-10-CM

## 2023-04-13 ENCOUNTER — Other Ambulatory Visit: Payer: Self-pay | Admitting: *Deleted

## 2023-04-13 DIAGNOSIS — I10 Essential (primary) hypertension: Secondary | ICD-10-CM

## 2023-04-13 DIAGNOSIS — Z789 Other specified health status: Secondary | ICD-10-CM

## 2023-04-13 DIAGNOSIS — I25110 Atherosclerotic heart disease of native coronary artery with unstable angina pectoris: Secondary | ICD-10-CM

## 2023-04-13 DIAGNOSIS — E782 Mixed hyperlipidemia: Secondary | ICD-10-CM

## 2023-04-13 MED ORDER — IRBESARTAN 300 MG PO TABS: 300.0000 mg | ORAL_TABLET | Freq: Every day | ORAL | 3 refills | Status: DC

## 2023-04-13 NOTE — Therapy (Signed)
OUTPATIENT PHYSICAL THERAPY VESTIBULAR TREATMENT     Patient Name: Felicia Perry MRN: [id number] DOB:[birth year], 77 y.o., female Today's Date: 04/13/2023  END OF SESSION:       Past Medical History:  Diagnosis Date   Adenomatous polyp 11/23/2005   Anxiety    01/09/22- patient denies anxiety   Asthma 2003   Basal cell carcinoma 2021   right cheek per patient at [place] [facility]   Carotid stenosis    Chronic diastolic CHF (congestive heart failure) (HCC) 2017   Echo 2021 was normal   COPD (chronic obstructive pulmonary disease) (HCC)    Coronary artery disease 2017   a. HC on 08/14/16 showed RCA CTO s/p PCI, 60% LCX disease managed medically with recs to consider staged PCI if continued symptoms.    Depression 2008   pt reports MD diagnosed but she does not feel depressed   Diabetes mellitus without complication (HCC)    DIVERTICULOSIS, COLON 04/22/2007        GERD (gastroesophageal reflux disease) 2012   History of radiation therapy    Right breast- 03/04/22-04/02/22- Dr. Antony Blackbird   History of shingles    Hypertension 2008   Hypertensive heart disease    Insomnia 2003   Internal hemorrhoids    Osteoarthritis 2008   "knees, hands, lower back" (08/14/2016)   Pap smear for cervical cancer screening 2002   results normal   Personal history of radiation therapy    PUD (peptic ulcer disease) 1989   Seasonal allergies 2013   Sleep apnea    a. resolved with weight loss   Past Surgical History:  Procedure Laterality Date   BREAST BIOPSY Right 12/30/2021   BREAST EXCISIONAL BIOPSY Left    1980's x 3 - all benign   BREAST EXCISIONAL BIOPSY Right    1980's x 1 or 2 - all benign   BREAST LUMPECTOMY Right 01/22/2022   BREAST LUMPECTOMY WITH RADIOACTIVE SEED LOCALIZATION Right 01/22/2022   Procedure: RIGHT BREAST LUMPECTOMY WITH RADIOACTIVE SEED LOCALIZATION;  Surgeon: Harriette Bouillon, MD;  Location: [facility];  Service: General;  Laterality:  Right;   BREAST SURGERY Left 1980s   Fibrous Tumors removed    CARDIAC CATHETERIZATION N/A 08/14/2016   Procedure: Left Heart Cath and Coronary Angiography;  Surgeon: Tonny Bollman, MD;  Location: [facility] INVASIVE CV LAB;  Service: Cardiovascular;  Laterality: N/A;   CARDIAC CATHETERIZATION N/A 08/14/2016   Procedure: Coronary Stent Intervention;  Surgeon: Tonny Bollman, MD;  Location: [facility] INVASIVE CV LAB;  Service: Cardiovascular;  Laterality: N/A;   CATARACT EXTRACTION W/ INTRAOCULAR LENS  IMPLANT, BILATERAL Bilateral 05/2009   COLONOSCOPY N/A 2016   Cologuard 2019   CORONARY ANGIOPLASTY     detached retina left eye     may 2019   LEFT HEART CATH AND CORONARY ANGIOGRAPHY N/A 09/25/2022   Procedure: LEFT HEART CATH AND CORONARY ANGIOGRAPHY;  Surgeon: Kathleene Hazel, MD;  Location: [facility] INVASIVE CV LAB;  Service: Cardiovascular;  Laterality: N/A;   TUBAL LIGATION  1970s   Patient Active Problem List   Diagnosis Date Noted   Shortness of breath 09/25/2022   Genetic testing 01/19/2022   Ductal carcinoma in situ (DCIS) of right breast 01/05/2022   Diabetes mellitus type 2 with complications (HCC) 02/05/2021   Intractable headache 01/02/2021   Greater trochanteric bursitis of left hip 08/13/2020   Senile purpura (HCC) 08/07/2020   statin myalgia 06/25/2020   Carotid stenosis 01/23/2020   Chronic diastolic CHF (congestive heart failure) (HCC)  01/12/2019   Degenerative arthritis of knee, bilateral 12/08/2018   Partial hamstring tear, initial encounter 11/16/2018   Hyperlipidemia, unspecified 07/11/2018   Nonallopathic lesion of sacral region 07/05/2018   Nonallopathic lesion of cervical region 07/05/2018   DDD (degenerative disc disease), cervical 05/04/2018   Degenerative arthritis of left knee 09/15/2017   H/O hyperparathyroidism 08/18/2016   Coronary artery disease involving native coronary artery of native heart without angina pectoris    PUD (peptic ulcer disease)    Syncope  08/04/2016   Vitamin D deficiency 08/20/2015   Hyperglycemia 08/06/2015   Xerosis of skin 08/06/2015   Hypersomnia with sleep apnea 03/26/2015   Chronic pain syndrome 03/26/2015   Nonallopathic lesion of lumbosacral region 11/13/2014   Osteoarthritis of left lower extremity 10/17/2014   Chronic meniscal tear of knee 10/17/2014   Nonallopathic lesion of thoracic region 09/14/2014   Nonallopathic lesion-rib cage 08/21/2014   Tendinopathy of right rotator cuff 07/30/2014   Piriformis syndrome of right side 07/30/2014   Insomnia 07/13/2014   Limited joint range of motion 07/13/2014   Rash 07/13/2014   GERD (gastroesophageal reflux disease) 02/25/2011   SI (sacroiliac) joint dysfunction 10/10/2007   LOW BACK PAIN 07/01/2007   Depression 04/22/2007   Essential hypertension 04/22/2007   Asthma 04/22/2007    PCP: Shelva Majestic, MD REFERRING PROVIDER: Judi Saa, DO  REFERRING DIAG: [name] (ICD-10-CM) - Dizziness R26.89 (ICD-10-CM) - Balance disorder   THERAPY DIAG:  No diagnosis found.  ONSET DATE: 3-4 weeks   Rationale for Evaluation and Treatment: Rehabilitation  SUBJECTIVE:   SUBJECTIVE STATEMENT: "Not too bad. I worked really hard this AM."   Pt accompanied by: self  PERTINENT HISTORY: Anxiety, asthma, CHF, CAD, depression, DM, HTN, breast CA  PAIN:  Are you having pain? Yes: NPRS scale: 0/10 Pain location: B posterolateral neck Pain description: stiff Aggravating factors: AM Relieving factors: tylenol  PRECAUTIONS: Fall  WEIGHT BEARING RESTRICTIONS: No  FALLS: Has patient fallen in last 6 months? Yes. 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 No dizziness or imbalance   1/2 turns to targets  Turning en bloc but good stability and tolerance         PATIENT EDUCATION: Education details: review and consolidation of HEP for max benefit/challenge; edu on POC Person educated: Patient Education method: Explanation, Demonstration, Tactile cues, Verbal cues, and Handouts Education comprehension: verbalized understanding and returned demonstration    HOME EXERCISE PROGRAM Last updated: 04/12/23 Access Code: [email or web address] URL: [email or web address] Date: 04/12/2023 Prepared by: [facility] - Outpatient  Rehab - [facility]  Exercises - Seated Shoulder Rolls  - 1 x  daily - 5 x weekly - 2 sets - 10 reps - Seated Scapular Retraction  - 1 x daily - 5 x weekly - 2 sets - 10 reps - 3 sec hold - Seated Cervical Rotation AROM  - 1 x daily - 5 x weekly - 2 sets - 10 reps - Gentle Levator Scapulae Stretch  - 1 x daily - 5 x weekly - 2 sets - 30 sec hold - Corner Balance Feet Together: Eyes Open With Head Turns  - 1 x daily - 7 x weekly - 3 sets - 3 reps - Corner Balance Feet Together: Eyes Closed With Head Turns  - 1 x daily - 7 x weekly - 3 sets - 3 reps - Gait with Head Nods and Turns on Grass  - 1 x daily - 5 x weekly - 2 sets - 5 reps   Below measures were taken at time of initial evaluation unless otherwise specified:   DIAGNOSTIC FINDINGS: none recent (pt did not have imaging after her falls)  COGNITION: Overall cognitive status: Within  functional limits for tasks assessed   SENSATION: Report L lateral 2 toes N/T occasionally, otherwise intact    Cervical ROM:    Active AROM (deg) eval  Flexion 35  Extension 54  Right lateral flexion 25 * pain over L cervical paraspinals   Left lateral flexion 25 * pain over L cervical paraspinals   Right rotation 54  Left rotation 57 *c/o tight  (Blank rows = not tested)  GAIT: Gait pattern: guarded and slightly unsteady  Assistive device utilized: None Level of assistance: SBA   PATIENT SURVEYS:  FOTO 52, 64  VESTIBULAR ASSESSMENT:  GENERAL OBSERVATION: pt wears reading glasses   OCULOMOTOR EXAM:  Ocular Alignment: normal  Ocular ROM: No Limitations  Spontaneous Nystagmus: absent  Gaze-Induced Nystagmus: absent  Smooth Pursuits:  several saccades vertically, a couple horizontally   Saccades: intact  Convergence/Divergence: 4 cm limited by L eye  VESTIBULAR - OCULAR REFLEX:   Slow VOR: difficulty focusing and c/o feeling unsteady horizontal, c/o neck discomfort vertical  VOR Cancellation: Normal; c/o dizziness  Head-Impulse Test: HIT Right: negative HIT Left: negative *this test caused an episode of retropulsion d/t pt feeling unsteady   POSITIONAL TESTING:  Right Roll Test: negative, dizziness upon sitting up Left Roll Test: negative, dizziness upon sitting up Right Dix-Hallpike: negative Left Dix-Hallpike: negative, dizziness upon sitting up Right Sidelying: negative Left Sidelying: negative, dizziness upon sitting up      GOALS: Goals reviewed with patient? Yes  SHORT TERM GOALS: Target date: 03/31/2023  Patient to be independent with initial HEP. Baseline: HEP initiated Goal status: MET    LONG TERM GOALS: Target date: 04/21/2023  Patient to be independent with advanced HEP. Baseline: Not yet initiated  Goal status: IN PROGRESS  Patient to report 0/10 dizziness with standing vertical and horizontal VOR for 30 seconds. Baseline:  Unable Goal status: IN PROGRESS  Patient will report 0/10 dizziness with bed mobility.  Baseline: Symptomatic  Goal status: IN PROGRESS  Patient to score at least 20/24 on DGI in order to decrease risk of falls. Baseline: 22/24 Goal status: MET  Patient to score at least 52 on FOTO in order to indicate improved functional outcomes.  Baseline: 64 Goal status: IN PROGRESS  Patient to report no falls in the past 4 weeks. Baseline: - Goal status: IN PROGRESS     ASSESSMENT:  CLINICAL IMPRESSION: Patient arrived to session without complaints. Worked on balance activities  with head movements- patient without dizziness but occasional sway/LOB. However, stability overall is improved and patient is tolerating more challenging activities. 1 episode of dizziness and LOB was evident with gait with head nods, however this resolved in seconds. HEP was update and consolidated for max challenge. No complaints at end of session.   OBJECTIVE IMPAIRMENTS: Abnormal gait, decreased activity tolerance, decreased balance, decreased ROM, dizziness, and pain.   ACTIVITY LIMITATIONS: carrying, lifting, bending, sitting, standing, squatting, sleeping, stairs, transfers, bed mobility, bathing, dressing, reach over head, hygiene/grooming, and locomotion level  PARTICIPATION LIMITATIONS: meal prep, cleaning, laundry, driving, shopping, community activity, occupation, and church  PERSONAL FACTORS: Age, Past/current experiences, Time since onset of injury/illness/exacerbation, and 3+ comorbidities: Anxiety, asthma, CHF, CAD, depression, DM, HTN, breast CA  are also affecting patient's functional outcome.   REHAB POTENTIAL: Good  CLINICAL DECISION MAKING: Evolving/moderate complexity  EVALUATION COMPLEXITY: Moderate   PLAN:  PT FREQUENCY: 1-2x/week  PT DURATION: 6 weeks  PLANNED INTERVENTIONS: Therapeutic exercises, Therapeutic activity, Neuromuscular re-education, Balance training, Gait training,  Patient/Family education, Self Care, Joint mobilization, Stair training, Vestibular training, Canalith repositioning, DME instructions, Aquatic Therapy, Dry Needling, Electrical stimulation, Cryotherapy, Moist heat, Taping, Manual therapy, and Re-evaluation  PLAN FOR NEXT SESSION: walking VOR, corner balance review, continue with multisensory balance activities and habituation   Anette Guarneri, PT, DPT 04/13/23 12:40 PM  [facility] [address], [address] [place], [place] [address] Phone # [phone] Fax # [phone]

## 2023-04-14 ENCOUNTER — Encounter: Payer: Self-pay | Admitting: Physical Therapy

## 2023-04-14 ENCOUNTER — Ambulatory Visit: Payer: Medicare Other | Admitting: Physical Therapy

## 2023-04-14 DIAGNOSIS — R2681 Unsteadiness on feet: Secondary | ICD-10-CM | POA: Diagnosis not present

## 2023-04-14 DIAGNOSIS — R42 Dizziness and giddiness: Secondary | ICD-10-CM | POA: Diagnosis not present

## 2023-04-19 ENCOUNTER — Other Ambulatory Visit: Payer: Medicare Other

## 2023-04-26 NOTE — Progress Notes (Deleted)
[name] [address] [place] [address] Phone: [phone] Subjective:    I'm seeing this patient by the request  of:  Felicia Majestic, MD  CC:   [id number]  03/09/2023 We discussed the possibility of osteopathic manipulation but feels like she is more concerned with the balance and coordination difficulties she is having.  Patient has been worked up relatively completely for her vertigo-like symptoms.  We discussed with patient that we will get patient into physical therapy that I think will be helpful for the balance and coordination.  Total time with patient today 31 minutes.  Patient does have significant number of life stressors that is likely playing a role including some difficulties with her family.      Update 04/27/2023 Felicia Perry is a 77 y.o. female coming in with complaint of neck pain and dizziness. Patient has been doing PT. Patient states        Past Medical History:  Diagnosis Date   Adenomatous polyp 11/23/2005   Anxiety    01/09/22- patient denies anxiety   Asthma 2003   Basal cell carcinoma 2021   right cheek per patient at [place] [facility]   Carotid stenosis    Chronic diastolic CHF (congestive heart failure) (HCC) 2017   Echo 2021 was normal   COPD (chronic obstructive pulmonary disease) (HCC)    Coronary artery disease 2017   a. HC on 08/14/16 showed RCA CTO s/p PCI, 60% LCX disease managed medically with recs to consider staged PCI if continued symptoms.    Depression 2008   pt reports MD diagnosed but she does not feel depressed   Diabetes mellitus without complication (HCC)    DIVERTICULOSIS, COLON 04/22/2007        GERD (gastroesophageal reflux disease) 2012   History of radiation therapy    Right breast- 03/04/22-04/02/22- Dr. Antony Blackbird   History of shingles    Hypertension 2008   Hypertensive heart disease    Insomnia 2003   Internal hemorrhoids    Osteoarthritis 2008   "knees, hands,  lower back" (08/14/2016)   Pap smear for cervical cancer screening 2002   results normal   Personal history of radiation therapy    PUD (peptic ulcer disease) 1989   Seasonal allergies 2013   Sleep apnea    a. resolved with weight loss   Past Surgical History:  Procedure Laterality Date   BREAST BIOPSY Right 12/30/2021   BREAST EXCISIONAL BIOPSY Left    1980's x 3 - all benign   BREAST EXCISIONAL BIOPSY Right    1980's x 1 or 2 - all benign   BREAST LUMPECTOMY Right 01/22/2022   BREAST LUMPECTOMY WITH RADIOACTIVE SEED LOCALIZATION Right 01/22/2022   Procedure: RIGHT BREAST LUMPECTOMY WITH RADIOACTIVE SEED LOCALIZATION;  Surgeon: Harriette Bouillon, MD;  Location: [facility];  Service: General;  Laterality: Right;   BREAST SURGERY Left 1980s   Fibrous Tumors removed    CARDIAC CATHETERIZATION N/A 08/14/2016   Procedure: Left Heart Cath and Coronary Angiography;  Surgeon: Tonny Bollman, MD;  Location: [facility] INVASIVE CV LAB;  Service: Cardiovascular;  Laterality: N/A;   CARDIAC CATHETERIZATION N/A 08/14/2016   Procedure: Coronary Stent Intervention;  Surgeon: Tonny Bollman, MD;  Location: [facility] INVASIVE CV LAB;  Service: Cardiovascular;  Laterality: N/A;   CATARACT EXTRACTION W/ INTRAOCULAR LENS  IMPLANT, BILATERAL Bilateral 05/2009   COLONOSCOPY N/A 2016   Cologuard 2019   CORONARY ANGIOPLASTY     detached retina  left eye     may 2019   LEFT HEART CATH AND CORONARY ANGIOGRAPHY N/A 09/25/2022   Procedure: LEFT HEART CATH AND CORONARY ANGIOGRAPHY;  Surgeon: Kathleene Hazel, MD;  Location: [facility] INVASIVE CV LAB;  Service: Cardiovascular;  Laterality: N/A;   TUBAL LIGATION  1970s   Social History   Socioeconomic History   Marital status: Divorced    Spouse name: Not on file   Number of children: Not on file   Years of education: Not on file   Highest education level: Not on file  Occupational History   Occupation: Cleaning  Tobacco Use   Smoking status: Former     Packs/day: 1.00    Years: 26.00    Additional pack years: 0.00    Total pack years: 26.00    Types: Cigarettes    Quit date: 1989    Years since quitting: 35.5   Smokeless tobacco: Never  Vaping Use   Vaping Use: Never used  Substance and Sexual Activity   Alcohol use: No   Drug use: No   Sexual activity: Not Currently  Other Topics Concern   Not on file  Social History Narrative   Divorced for 38 years in 2016. 2 sons- 1 son had been in prison now living with her in 2019.  2 granddaughters with 1 living close.        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 Best friend Fannie Knee patient of Dr. Durene Cal. Enjoys work, time on Animator.    Social Determinants of Health   Financial Resource Strain: Medium Risk (04/04/2023)   Overall Financial Resource Strain (CARDIA)    Difficulty of Paying Living Expenses: Somewhat hard  Food Insecurity: No Food Insecurity (04/04/2023)   Hunger Vital Sign    Worried About Running Out of Food in the Last Year: Never true    Ran Out of Food in the Last Year: Never true  Transportation Needs: No Transportation Needs (04/04/2023)   PRAPARE - Administrator, Civil Service (Medical): No    Lack of Transportation (Non-Medical): No  Physical Activity: Insufficiently Active (04/04/2023)   Exercise Vital Sign    Days of Exercise per Week: 2 days    Minutes of Exercise per Session: 50 min  Stress: Patient Declined (04/04/2023)   [name] of Occupational Health - Occupational Stress Questionnaire    Feeling of Stress : Patient declined  Social Connections: Moderately Integrated (04/04/2023)   Social Connection and Isolation Panel [NHANES]    Frequency of Communication with Friends and Family: More than three times a week    Frequency of Social Gatherings with Friends and Family: Three times a week     Attends Religious Services: More than 4 times per year    Active Member of Clubs or Organizations: Yes    Attends Engineer, structural: More than 4 times per year    Marital Status: Divorced   Allergies  Allergen Reactions   Penicillins Anaphylaxis    REACTION: per patient causes rash,hives   Cephalosporins Swelling    REACTION: tongue swelling   Citalopram Hydrobromide Other (See Comments)    psychosis   Rosuvastatin Other (See Comments)    Pt reports causes bilateral lower extremity muscle aches.    Zolpidem Tartrate     REACTION: difficulty with concentration  Clarithromycin Rash    REACTION: blisters in mouth   Levofloxacin Rash    REACTION: tongue swells   Family History  Problem Relation Age of Onset   Heart disease Mother    Hypertension Mother    Diabetes Mother    Dementia Mother    COPD Father    Dementia Maternal Grandmother    Colon cancer Neg Hx      Current Outpatient Medications (Cardiovascular):    amLODipine (NORVASC) 10 MG tablet, Take 1 tablet (10 mg total) by mouth daily.   Bempedoic Acid-Ezetimibe (NEXLIZET) 180-10 MG TABS, Take 180 mg by mouth daily.   carvedilol (COREG) 25 MG tablet, Take 1 tablet (25 mg total) by mouth 2 (two) times daily with a meal.   irbesartan (AVAPRO) 300 MG tablet, Take 1 tablet (300 mg total) by mouth daily.   omega-3 acid ethyl esters (LOVAZA) 1 g capsule, TAKE TWO CAPSULES EVERY DAY   spironolactone (ALDACTONE) 25 MG tablet, Take 0.5 tablets (12.5 mg total) by mouth daily.  Current Outpatient Medications (Respiratory):    albuterol (VENTOLIN HFA) 108 (90 Base) MCG/ACT inhaler, Inhale 2 puffs into the lungs every 6 (six) hours as needed for wheezing or shortness of breath.   budesonide-formoterol (SYMBICORT) 160-4.5 MCG/ACT inhaler, Inhale 2 puffs into the lungs 2 (two) times daily.   levocetirizine (XYZAL) 5 MG tablet, Take 1 tablet (5 mg total) by mouth every morning.  Current Outpatient Medications  (Analgesics):    acetaminophen (TYLENOL) 325 MG tablet, Take 650 mg by mouth every 6 (six) hours as needed for mild pain, moderate pain or headache.   aspirin EC 81 MG tablet, Take 1 tablet (81 mg total) by mouth daily. Swallow whole.   Current Outpatient Medications (Other):    ALPRAZolam (XANAX) 1 MG tablet, Take 1 tablet (1 mg total) by mouth at bedtime. DO NOT DRIVE FOR 8 HOURS AFTER TAKING   anastrozole (ARIMIDEX) 1 MG tablet, TAKE ONE TABLET DAILY   famotidine (PEPCID) 20 MG tablet, TAKE ONE TABLET TWICE DAILY   venlafaxine XR (EFFEXOR-XR) 75 MG 24 hr capsule, TAKE ONE CAPSULE EACH DAY WITH BREAKFAST   Reviewed prior external information including notes and imaging from  primary care provider As well as notes that were available from care everywhere and other healthcare systems.  Past medical history, social, surgical and family history all reviewed in electronic medical record.  No pertanent information unless stated regarding to the chief complaint.   Review of Systems:  No headache, visual changes, nausea, vomiting, diarrhea, constipation, dizziness, abdominal pain, skin rash, fevers, chills, night sweats, weight loss, swollen lymph nodes, body aches, joint swelling, chest pain, shortness of breath, mood changes. POSITIVE muscle aches  Objective  There were no vitals taken for this visit.   General: No apparent distress alert and oriented x3 mood and affect normal, dressed appropriately.  HEENT: Pupils equal, extraocular movements intact  Respiratory: Patient's speak in full sentences and does not appear short of breath  Cardiovascular: No lower extremity edema, non tender, no erythema      Impression and Recommendations:

## 2023-04-28 ENCOUNTER — Ambulatory Visit: Payer: Medicare Other | Admitting: Family Medicine

## 2023-05-05 ENCOUNTER — Other Ambulatory Visit: Payer: Self-pay | Admitting: Family Medicine

## 2023-05-05 ENCOUNTER — Other Ambulatory Visit: Payer: Self-pay

## 2023-05-05 MED ORDER — AMLODIPINE BESYLATE 10 MG PO TABS: 10.0000 mg | ORAL_TABLET | Freq: Every day | ORAL | 2 refills | Status: DC

## 2023-05-07 ENCOUNTER — Telehealth: Payer: Self-pay | Admitting: Hematology and Oncology

## 2023-05-07 ENCOUNTER — Ambulatory Visit: Payer: Medicare Other | Admitting: Hematology and Oncology

## 2023-05-08 ENCOUNTER — Other Ambulatory Visit: Payer: Self-pay | Admitting: Family Medicine

## 2023-05-13 ENCOUNTER — Ambulatory Visit (INDEPENDENT_AMBULATORY_CARE_PROVIDER_SITE_OTHER): Payer: Medicare Other | Admitting: Family Medicine

## 2023-05-13 ENCOUNTER — Encounter: Payer: Self-pay | Admitting: Family Medicine

## 2023-05-13 VITALS — BP 112/78 | HR 70 | Temp 98.3°F | Ht 60.0 in | Wt 159.6 lb

## 2023-05-13 DIAGNOSIS — E118 Type 2 diabetes mellitus with unspecified complications: Secondary | ICD-10-CM

## 2023-05-13 DIAGNOSIS — E1169 Type 2 diabetes mellitus with other specified complication: Secondary | ICD-10-CM

## 2023-05-13 DIAGNOSIS — G720 Drug-induced myopathy: Secondary | ICD-10-CM

## 2023-05-13 DIAGNOSIS — F32A Depression, unspecified: Secondary | ICD-10-CM | POA: Diagnosis not present

## 2023-05-13 DIAGNOSIS — E559 Vitamin D deficiency, unspecified: Secondary | ICD-10-CM | POA: Diagnosis not present

## 2023-05-13 DIAGNOSIS — I2510 Atherosclerotic heart disease of native coronary artery without angina pectoris: Secondary | ICD-10-CM

## 2023-05-13 DIAGNOSIS — E785 Hyperlipidemia, unspecified: Secondary | ICD-10-CM

## 2023-05-13 DIAGNOSIS — I10 Essential (primary) hypertension: Secondary | ICD-10-CM | POA: Diagnosis not present

## 2023-05-13 NOTE — Patient Instructions (Addendum)
Get diabetic eye exam scheduled  Let us know when you get your [facility] and COVID vaccines at your pharmacy.  Please stop by lab before you go If you have mychart- we will send your results within 3 business days of [place] receiving them.  If you do not have mychart- we will call you about results within 5 business days of [place] receiving them.  *please also note that you will see labs on mychart as soon as they post. I will later go in and write notes on them- will say "notes from Dr. Durene Cal"    Recommended follow up: Return in about 6 months (around 11/13/2023) for physical or sooner if needed.Schedule b4 you leave.

## 2023-05-13 NOTE — Progress Notes (Signed)
Phone [phone] In person visit   Subjective:   Felicia Perry is a 77 y.o. year old very pleasant female patient who presents for/with See problem oriented charting Chief Complaint  Patient presents with   Medical Management of Chronic Issues   Hyperlipidemia   Hypertension   Diabetes   Past Medical History-  Patient Active Problem List   Diagnosis Date Noted   Ductal carcinoma in situ (DCIS) of right breast 01/05/2022    Priority: High   Diabetes mellitus type 2 with complications (HCC) 02/05/2021    Priority: High   Carotid stenosis 01/23/2020    Priority: High   Chronic diastolic CHF (congestive heart failure) (HCC) 01/12/2019    Priority: High   Coronary artery disease involving native coronary artery of native heart without angina pectoris     Priority: High   Syncope 08/04/2016    Priority: High   statin myalgia 06/25/2020    Priority: Medium    Hyperlipidemia associated with type 2 diabetes mellitus (HCC) 07/11/2018    Priority: Medium    H/O hyperparathyroidism 08/18/2016    Priority: Medium    Vitamin D deficiency 08/20/2015    Priority: Medium    Chronic pain syndrome 03/26/2015    Priority: Medium    Insomnia 07/13/2014    Priority: Medium    GERD (gastroesophageal reflux disease) 02/25/2011    Priority: Medium    Depression 04/22/2007    Priority: Medium    Essential hypertension 04/22/2007    Priority: Medium    Asthma 04/22/2007    Priority: Medium    Greater trochanteric bursitis of left hip 08/13/2020    Priority: Low   Senile purpura (HCC) 08/07/2020    Priority: Low   Degenerative arthritis of knee, bilateral 12/08/2018    Priority: Low   Partial hamstring tear, initial encounter 11/16/2018    Priority: Low   Nonallopathic lesion of sacral region 07/05/2018    Priority: Low   Nonallopathic lesion of cervical region 07/05/2018    Priority: Low   DDD (degenerative disc disease), cervical 05/04/2018    Priority: Low   Degenerative  arthritis of left knee 09/15/2017    Priority: Low   PUD (peptic ulcer disease)     Priority: Low   Hyperglycemia 08/06/2015    Priority: Low   Xerosis of skin 08/06/2015    Priority: Low   Hypersomnia with sleep apnea 03/26/2015    Priority: Low   Nonallopathic lesion of lumbosacral region 11/13/2014    Priority: Low   Osteoarthritis of left lower extremity 10/17/2014    Priority: Low   Chronic meniscal tear of knee 10/17/2014    Priority: Low   Nonallopathic lesion of thoracic region 09/14/2014    Priority: Low   Nonallopathic lesion-rib cage 08/21/2014    Priority: Low   Tendinopathy of right rotator cuff 07/30/2014    Priority: Low   Piriformis syndrome of right side 07/30/2014    Priority: Low   Limited joint range of motion 07/13/2014    Priority: Low   Rash 07/13/2014    Priority: Low   SI (sacroiliac) joint dysfunction 10/10/2007    Priority: Low   LOW BACK PAIN 07/01/2007    Priority: Low   Shortness of breath 09/25/2022   Genetic testing 01/19/2022   Intractable headache 01/02/2021    Medications- reviewed and updated Current Outpatient Medications  Medication Sig Dispense Refill   acetaminophen (TYLENOL) 325 MG tablet Take 650 mg by mouth every 6 (six) hours as  needed for mild pain, moderate pain or headache.     albuterol (VENTOLIN HFA) 108 (90 Base) MCG/ACT inhaler Inhale 2 puffs into the lungs every 6 (six) hours as needed for wheezing or shortness of breath. 18 g 5   amLODipine (NORVASC) 10 MG tablet Take 1 tablet (10 mg total) by mouth daily. 90 tablet 2   anastrozole (ARIMIDEX) 1 MG tablet TAKE ONE TABLET DAILY 30 tablet 3   aspirin EC 81 MG tablet Take 1 tablet (81 mg total) by mouth daily. Swallow whole. 90 tablet 3   Bempedoic Acid-Ezetimibe (NEXLIZET) 180-10 MG TABS Take 180 mg by mouth daily. 90 tablet 3   budesonide-formoterol (SYMBICORT) 160-4.5 MCG/ACT inhaler Inhale 2 puffs into the lungs 2 (two) times daily. 1 each 11   carvedilol (COREG) 25  MG tablet Take 1 tablet (25 mg total) by mouth 2 (two) times daily with a meal. 180 tablet 3   famotidine (PEPCID) 20 MG tablet TAKE ONE TABLET TWICE DAILY 60 tablet 5   irbesartan (AVAPRO) 300 MG tablet Take 1 tablet (300 mg total) by mouth daily. 90 tablet 3   levocetirizine (XYZAL) 5 MG tablet TAKE ONE TABLET BY MOUTH EVERY MORNING 90 tablet 3   omega-3 acid ethyl esters (LOVAZA) 1 g capsule TAKE TWO CAPSULES EVERY DAY 180 capsule 3   spironolactone (ALDACTONE) 25 MG tablet Take 0.5 tablets (12.5 mg total) by mouth daily. 45 tablet 3   venlafaxine XR (EFFEXOR-XR) 75 MG 24 hr capsule TAKE ONE CAPSULE EACH DAY WITH BREAKFAST 90 capsule 2   ALPRAZolam (XANAX) 1 MG tablet TAKE ONE TABLET BY MOUTH AT BEDTIME AS NEEDED FOR SLEEP -do not drive FOR EIGHT hours AFTER taking (Patient not taking: Reported on 05/13/2023) 90 tablet 1   No current facility-administered medications for this visit.     Objective:  BP 112/78 Comment: most recent home reading this morning  Pulse 70   Temp 98.3 F (36.8 C)   Ht 5' (1.524 m)   Wt 159 lb 9.6 oz (72.4 kg)   SpO2 96%   BMI 31.17 kg/m  Gen: NAD, resting comfortably CV: RRR no murmurs rubs or gallops Lungs: CTAB no crackles, wheeze, rhonchi Abdomen: soft/nontender/nondistended/normal bowel sounds. No rebound or guarding.  Ext: no edema Skin: warm, dry    Assessment and Plan   #social update- a lot of stress- has lost a big client and son brought a woman into their home that she is not comfortable with. She does not feel safe. She has talked to magistrate about having moved out  #Concussion- had this back in may- ended up seeing eye doctor as caused a lot of blinking. Dr. Katrinka Blazing helped her with physical therapy. She is feeling much more mobile and stable at this point. Had hit her head in tub twice within 2 weeks at her house- didn't turn light on going into the bathroom- feet got tangled- both times actually was with light off- she now keeps a light on all  the time there. Seh also had vertigo issues- she is doing exercises and much better now  # Diabetes-diagnosed 08/07/2020 after having second A1c 6.5 or above S: Medication: None, currently diet controlled- tries to be intentional about eating well and remaining active with work- outside of recent injury Lab Results  Component Value Date   HGBA1C 6.8 (H) 11/12/2022   HGBA1C 6.4 04/08/2022   HGBA1C 7.0 (H) 12/09/2021  A/P: hopefully stable- update a1c today. Continue without meds for now   #  hypertension #Chronic diastolic CHF S: medication: Amlodipine 10 mg, irbesartan 300 mg, coreg 25 mg BID spironolactone 25 mg - no edema or weight gain. Hasn't needed lasix in long time  BP Readings from Last 3 Encounters:  05/13/23 112/78  04/05/23 128/64  03/09/23 118/82  A/P: hypertension stable- continue current medicines on home readings- continue current medications- initial blood pressure up with stress of recounting concussion experience CHF euvolemic - continue current medications    #CAD  #hyperlipidemia with statin myalgia S: Medication: nexlizet (bempedoic acid-ezetimibe), Lovaza, aspirin 81 mg - no chest pain and shortness of breath - sad that Dr. Shari Prows is moving  -Still struggles with elevated triglycerides on this regimen Lab Results  Component Value Date   CHOL 120 11/12/2022   HDL 32.30 (L) 11/12/2022   LDLCALC 48 08/07/2020   LDLDIRECT 58.0 11/12/2022   TRIG 294.0 (H) 11/12/2022   CHOLHDL 4 11/12/2022   A/P: coronary artery disease asymptomatic - continue current medications  Cholesterol overall has looked good- triglyceride(s) still somewhat high- recheck next visit  # Asthma/allergies S: Maintenance Medication: Symbicort 160-4.52 puffs.  Mainly uses as needed and has done well lately  A/P: stable- continue current medicines    #Depression-medication also helps with chronic pain/insomnia S: Medication: Effexor 75 mg extended release alprazolam 1 mg as needed before  bed    05/13/2023    3:05 PM 04/05/2023    4:04 PM 11/12/2022    3:11 PM  Depression screen PHQ 2/9  Decreased Interest 3 0 0  Down, Depressed, Hopeless 0 0 1  PHQ - 2 Score 3 0 1  Altered sleeping 3  0  Tired, decreased energy 3  0  Change in appetite 2  0  Feeling bad or failure about yourself  0  0  Trouble concentrating 0  0  Moving slowly or fidgety/restless 0  0  Suicidal thoughts 0  0  PHQ-9 Score 11  1  Difficult doing work/chores Somewhat difficult  Not difficult at all  A/P: depression scores are up today but has had some intense acute stressors recently- she wants to stay stable on medicine and let situation work itself back down.  -discussed if any more falls would need to stop alprazolam- the fact that they occurred with lights off and she is using light at all times now suggests to me that falls are not likely but we discussed if recurs would need to reduce or stop medicine and look for other options    #Vitamin D deficiency S: Medication: none Last vitamin D Lab Results  Component Value Date   VD25OH 29.03 (L) 11/12/2022  A/P: vitamin D was slightly low and has been off recently- we will check today- plan had been 2000 units a day for a month then 1000 units but I'm concerned may have dropped lower    Recommended follow up: Return in about 6 months (around 11/13/2023) for physical or sooner if needed.Schedule b4 you leave. Future Appointments  Date Time Provider Department Center  06/04/2023 10:45 AM Judi Saa, DO [facility] None  06/30/2023  1:30 PM [facility] VASC 3 [facility]  07/01/2023  3:00 PM Rachel Moulds, MD [facility] None  07/16/2023  8:00 AM Sharlene Dory, PA-C [facility]  10/08/2023  9:35 AM [facility] ECHO 1 [facility]  04/10/2024  4:00 PM [facility] ANNUAL WELLNESS VISIT 1 [facility] PEC    Lab/Order associations:   ICD-10-CM   1. Diabetes mellitus type 2 with complications (HCC)  E11.8 Hemoglobin A1c    CBC with  Differential/Platelet    Comprehensive metabolic panel    2. Essential hypertension  I10     3. Hyperlipidemia associated with type 2 diabetes mellitus (HCC)  E11.69    E78.5     4. Drug-induced myopathy  G72.0     5. Coronary artery disease involving native coronary artery of native heart without angina pectoris  I25.10     6. Vitamin D deficiency  E55.9 VITAMIN D 25 Hydroxy (Vit-D Deficiency, Fractures)      Return precautions advised.  Tana Conch, MD

## 2023-05-14 LAB — COMPREHENSIVE METABOLIC PANEL
ALT: 22 U/L (ref 0–35)
AST: 25 U/L (ref 0–37)
Albumin: 4.4 g/dL (ref 3.5–5.2)
Alkaline Phosphatase: 60 U/L (ref 39–117)
BUN: 22 mg/dL (ref 6–23)
CO2: 29 mEq/L (ref 19–32)
Calcium: 10.4 mg/dL (ref 8.4–10.5)
Chloride: 101 mEq/L (ref 96–112)
Creatinine, Ser: 1 mg/dL (ref 0.40–1.20)
GFR: 54.58 mL/min — ABNORMAL LOW (ref >60.00)
Glucose, Bld: 102 mg/dL — ABNORMAL HIGH (ref 70–99)
Potassium: 4.6 mEq/L (ref 3.5–5.1)
Sodium: 140 mEq/L (ref 135–145)
Total Bilirubin: 0.7 mg/dL (ref 0.2–1.2)
Total Protein: 7.8 g/dL (ref 6.0–8.3)

## 2023-05-14 LAB — CBC WITH DIFFERENTIAL/PLATELET
Basophils Absolute: 0.1 10*3/uL (ref 0.0–0.1)
Basophils Relative: 1.4 % (ref 0.0–3.0)
Eosinophils Absolute: 0.3 10*3/uL (ref 0.0–0.7)
Eosinophils Relative: 3.5 % (ref 0.0–5.0)
HCT: 42.3 % (ref 36.0–46.0)
Hemoglobin: 13.9 g/dL (ref 12.0–15.0)
Lymphocytes Relative: 22.5 % (ref 12.0–46.0)
Lymphs Abs: 1.9 10*3/uL (ref 0.7–4.0)
MCHC: 32.8 g/dL (ref 30.0–36.0)
MCV: 89.9 fl (ref 78.0–100.0)
Monocytes Absolute: 0.9 10*3/uL (ref 0.1–1.0)
Monocytes Relative: 10.6 % (ref 3.0–12.0)
Neutro Abs: 5.4 10*3/uL (ref 1.4–7.7)
Neutrophils Relative %: 62 % (ref 43.0–77.0)
Platelets: 374 10*3/uL (ref 150.0–400.0)
RBC: 4.7 Mil/uL (ref 3.87–5.11)
RDW: 13.1 % (ref 11.5–15.5)
WBC: 8.7 10*3/uL (ref 4.0–10.5)

## 2023-05-14 LAB — VITAMIN D 25 HYDROXY (VIT D DEFICIENCY, FRACTURES): VITD: 30.31 ng/mL (ref 30.00–100.00)

## 2023-05-14 LAB — HEMOGLOBIN A1C: Hgb A1c MFr Bld: 6.5 % (ref 4.6–6.5)

## 2023-06-03 NOTE — Progress Notes (Deleted)
[name] [address] [place] [address] Phone: [phone] Subjective:    I'm seeing this patient by the request  of:  Shelva Majestic, MD  CC:   [id number]  03/09/2023 We discussed the possibility of osteopathic manipulation but feels like she is more concerned with the balance and coordination difficulties she is having.  Patient has been worked up relatively completely for her vertigo-like symptoms.  We discussed with patient that we will get patient into physical therapy that I think will be helpful for the balance and coordination.  Total time with patient today 31 minutes.  Patient does have significant number of life stressors that is likely playing a role including some difficulties with her family.     Update 06/04/2023 Felicia Perry is a 77 y.o. female coming in with complaint of DDD cervical. Patient states       Past Medical History:  Diagnosis Date   Adenomatous polyp 11/23/2005   Anxiety    01/09/22- patient denies anxiety   Asthma 2003   Basal cell carcinoma 2021   right cheek per patient at [facility]   Carotid stenosis    Chronic diastolic CHF (congestive heart failure) (HCC) 2017   Echo 2021 was normal   COPD (chronic obstructive pulmonary disease) (HCC)    Coronary artery disease 2017   a. HC on 08/14/16 showed RCA CTO s/p PCI, 60% LCX disease managed medically with recs to consider staged PCI if continued symptoms.    Depression 2008   pt reports MD diagnosed but she does not feel depressed   Diabetes mellitus without complication (HCC)    DIVERTICULOSIS, COLON 04/22/2007        GERD (gastroesophageal reflux disease) 2012   History of radiation therapy    Right breast- 03/04/22-04/02/22- Dr. Antony Blackbird   History of shingles    Hypertension 2008   Hypertensive heart disease    Insomnia 2003   Internal hemorrhoids    Osteoarthritis 2008   "knees, hands, lower back" (08/14/2016)   Pap smear for  cervical cancer screening 2002   results normal   Personal history of radiation therapy    PUD (peptic ulcer disease) 1989   Seasonal allergies 2013   Sleep apnea    a. resolved with weight loss   Past Surgical History:  Procedure Laterality Date   BREAST BIOPSY Right 12/30/2021   BREAST EXCISIONAL BIOPSY Left    1980's x 3 - all benign   BREAST EXCISIONAL BIOPSY Right    1980's x 1 or 2 - all benign   BREAST LUMPECTOMY Right 01/22/2022   BREAST LUMPECTOMY WITH RADIOACTIVE SEED LOCALIZATION Right 01/22/2022   Procedure: RIGHT BREAST LUMPECTOMY WITH RADIOACTIVE SEED LOCALIZATION;  Surgeon: Harriette Bouillon, MD;  Location: [facility];  Service: General;  Laterality: Right;   BREAST SURGERY Left 1980s   Fibrous Tumors removed    CARDIAC CATHETERIZATION N/A 08/14/2016   Procedure: Left Heart Cath and Coronary Angiography;  Surgeon: Tonny Bollman, MD;  Location: [facility] INVASIVE CV LAB;  Service: Cardiovascular;  Laterality: N/A;   CARDIAC CATHETERIZATION N/A 08/14/2016   Procedure: Coronary Stent Intervention;  Surgeon: Tonny Bollman, MD;  Location: [facility] INVASIVE CV LAB;  Service: Cardiovascular;  Laterality: N/A;   CATARACT EXTRACTION W/ INTRAOCULAR LENS  IMPLANT, BILATERAL Bilateral 05/2009   COLONOSCOPY N/A 2016   Cologuard 2019   CORONARY ANGIOPLASTY     detached retina left eye     may 2019  LEFT HEART CATH AND CORONARY ANGIOGRAPHY N/A 09/25/2022   Procedure: LEFT HEART CATH AND CORONARY ANGIOGRAPHY;  Surgeon: Kathleene Hazel, MD;  Location: [facility] INVASIVE CV LAB;  Service: Cardiovascular;  Laterality: N/A;   TUBAL LIGATION  1970s   Social History   Socioeconomic History   Marital status: Divorced    Spouse name: Not on file   Number of children: Not on file   Years of education: Not on file   Highest education level: Not on file  Occupational History   Occupation: Cleaning  Tobacco Use   Smoking status: Former    Current packs/day: 0.00    Average  packs/day: 1 pack/day for 26.0 years (26.0 ttl pk-yrs)    Types: Cigarettes    Start date: 41    Quit date: 1989    Years since quitting: 35.6   Smokeless tobacco: Never  Vaping Use   Vaping status: Never Used  Substance and Sexual Activity   Alcohol use: No   Drug use: No   Sexual activity: Not Currently  Other Topics Concern   Not on file  Social History Narrative   Divorced for 38 years in 2016. 2 sons- 1 son had been in prison now living with her in 2019.  2 granddaughters with 1 living close.        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 Best friend Fannie Knee patient of Dr. Durene Cal. Enjoys work, time on Animator.    Social Determinants of Health   Financial Resource Strain: Medium Risk (04/04/2023)   Overall Financial Resource Strain (CARDIA)    Difficulty of Paying Living Expenses: Somewhat hard  Food Insecurity: No Food Insecurity (04/04/2023)   Hunger Vital Sign    Worried About Running Out of Food in the Last Year: Never true    Ran Out of Food in the Last Year: Never true  Transportation Needs: No Transportation Needs (04/04/2023)   PRAPARE - Administrator, Civil Service (Medical): No    Lack of Transportation (Non-Medical): No  Physical Activity: Insufficiently Active (04/04/2023)   Exercise Vital Sign    Days of Exercise per Week: 2 days    Minutes of Exercise per Session: 50 min  Stress: Patient Declined (04/04/2023)   [name] of Occupational Health - Occupational Stress Questionnaire    Feeling of Stress : Patient declined  Social Connections: Moderately Integrated (04/04/2023)   Social Connection and Isolation Panel [NHANES]    Frequency of Communication with Friends and Family: More than three times a week    Frequency of Social Gatherings with Friends and Family: Three times a week    Attends Religious  Services: More than 4 times per year    Active Member of Clubs or Organizations: Yes    Attends Engineer, structural: More than 4 times per year    Marital Status: Divorced   Allergies  Allergen Reactions   Penicillins Anaphylaxis    REACTION: per patient causes rash,hives   Cephalosporins Swelling    REACTION: tongue swelling   Citalopram Hydrobromide Other (See Comments)    psychosis   Rosuvastatin Other (See Comments)    Pt reports causes bilateral lower extremity muscle aches.    Zolpidem Tartrate     REACTION: difficulty with concentration   Clarithromycin Rash    REACTION: blisters in  mouth   Levofloxacin Rash    REACTION: tongue swells   Family History  Problem Relation Age of Onset   Heart disease Mother    Hypertension Mother    Diabetes Mother    Dementia Mother    COPD Father    Dementia Maternal Grandmother    Colon cancer Neg Hx      Current Outpatient Medications (Cardiovascular):    amLODipine (NORVASC) 10 MG tablet, Take 1 tablet (10 mg total) by mouth daily.   Bempedoic Acid-Ezetimibe (NEXLIZET) 180-10 MG TABS, Take 180 mg by mouth daily.   carvedilol (COREG) 25 MG tablet, Take 1 tablet (25 mg total) by mouth 2 (two) times daily with a meal.   irbesartan (AVAPRO) 300 MG tablet, Take 1 tablet (300 mg total) by mouth daily.   omega-3 acid ethyl esters (LOVAZA) 1 g capsule, TAKE TWO CAPSULES EVERY DAY   spironolactone (ALDACTONE) 25 MG tablet, Take 0.5 tablets (12.5 mg total) by mouth daily.  Current Outpatient Medications (Respiratory):    albuterol (VENTOLIN HFA) 108 (90 Base) MCG/ACT inhaler, Inhale 2 puffs into the lungs every 6 (six) hours as needed for wheezing or shortness of breath.   budesonide-formoterol (SYMBICORT) 160-4.5 MCG/ACT inhaler, Inhale 2 puffs into the lungs 2 (two) times daily.   levocetirizine (XYZAL) 5 MG tablet, TAKE ONE TABLET BY MOUTH EVERY MORNING  Current Outpatient Medications (Analgesics):    acetaminophen  (TYLENOL) 325 MG tablet, Take 650 mg by mouth every 6 (six) hours as needed for mild pain, moderate pain or headache.   aspirin EC 81 MG tablet, Take 1 tablet (81 mg total) by mouth daily. Swallow whole.   Current Outpatient Medications (Other):    ALPRAZolam (XANAX) 1 MG tablet, TAKE ONE TABLET BY MOUTH AT BEDTIME AS NEEDED FOR SLEEP -do not drive FOR EIGHT hours AFTER taking (Patient not taking: Reported on 05/13/2023)   anastrozole (ARIMIDEX) 1 MG tablet, TAKE ONE TABLET DAILY   famotidine (PEPCID) 20 MG tablet, TAKE ONE TABLET TWICE DAILY   venlafaxine XR (EFFEXOR-XR) 75 MG 24 hr capsule, TAKE ONE CAPSULE EACH DAY WITH BREAKFAST   Reviewed prior external information including notes and imaging from  primary care provider As well as notes that were available from care everywhere and other healthcare systems.  Past medical history, social, surgical and family history all reviewed in electronic medical record.  No pertanent information unless stated regarding to the chief complaint.   Review of Systems:  No headache, visual changes, nausea, vomiting, diarrhea, constipation, dizziness, abdominal pain, skin rash, fevers, chills, night sweats, weight loss, swollen lymph nodes, body aches, joint swelling, chest pain, shortness of breath, mood changes. POSITIVE muscle aches  Objective  There were no vitals taken for this visit.   General: No apparent distress alert and oriented x3 mood and affect normal, dressed appropriately.  HEENT: Pupils equal, extraocular movements intact  Respiratory: Patient's speak in full sentences and does not appear short of breath  Cardiovascular: No lower extremity edema, non tender, no erythema      Impression and Recommendations:

## 2023-06-04 ENCOUNTER — Ambulatory Visit: Payer: Medicare Other | Admitting: Family Medicine

## 2023-06-16 ENCOUNTER — Ambulatory Visit: Payer: Medicare Other | Admitting: Family Medicine

## 2023-06-22 ENCOUNTER — Ambulatory Visit (INDEPENDENT_AMBULATORY_CARE_PROVIDER_SITE_OTHER): Payer: Medicare Other | Admitting: Family

## 2023-06-22 ENCOUNTER — Telehealth: Payer: Self-pay

## 2023-06-22 ENCOUNTER — Encounter: Payer: Self-pay | Admitting: Family

## 2023-06-22 ENCOUNTER — Other Ambulatory Visit (HOSPITAL_COMMUNITY): Payer: Self-pay

## 2023-06-22 VITALS — BP 121/72 | HR 62 | Temp 97.5°F | Ht 60.0 in | Wt 162.1 lb

## 2023-06-22 DIAGNOSIS — J0110 Acute frontal sinusitis, unspecified: Secondary | ICD-10-CM | POA: Diagnosis not present

## 2023-06-22 DIAGNOSIS — R112 Nausea with vomiting, unspecified: Secondary | ICD-10-CM

## 2023-06-22 DIAGNOSIS — T50905A Adverse effect of unspecified drugs, medicaments and biological substances, initial encounter: Secondary | ICD-10-CM | POA: Diagnosis not present

## 2023-06-22 MED ORDER — ONDANSETRON HCL 4 MG PO TABS: 4.0000 mg | ORAL_TABLET | Freq: Three times a day (TID) | ORAL | 0 refills | Status: DC | PRN

## 2023-06-22 MED ORDER — DOXYCYCLINE HYCLATE 100 MG PO TABS: 100.0000 mg | ORAL_TABLET | Freq: Two times a day (BID) | ORAL | 0 refills | Status: AC

## 2023-06-22 NOTE — Telephone Encounter (Signed)
I called pt in regards, pt gave a verbalized understanding.

## 2023-06-22 NOTE — Telephone Encounter (Signed)
-----   Message from [place] sent at 06/22/2023 11:10 AM EDT ----- Regarding: visit today I sent a MyChart msg, but concerned pt may not see today - want her to use her Symbicort inhaler 1-2 puffs in morning and evening for next 3-4 days - this will help her Shortness of breath as well as taking the antibiotic, thx.

## 2023-06-22 NOTE — Progress Notes (Signed)
Patient ID: Felicia Perry, female    DOB: 1946/04/25, 77 y.o.   MRN: [id number]  Chief Complaint  Patient presents with   Sinus Problem    sx for 10d    HPI:      Viral sx:  Pt c/o headache, SOB, Nasal congestion, hoarseness and bilateral ear pain and fullness for 10 days. Also eye drainage & irritation.   Pt is unable to sleep for 2 nights. Has tried tylenol and Nyquil which did not help sx. Last night she had many trips to bathroom, did not take Nyquil last night, denies drinking a lot of water. Reports only minimal cough, dry.   Assessment & Plan:  1. Acute non-recurrent frontal sinusitis sending DOXY, pt has taken in past & tolerated along with zofran to prevent nausea. advised on use & SE of both meds. Advised on using nasal saline spray tid prn and increasing water intake to 2L daily. Also can take Tylenol for headache tid prn. Encourage using Symbicort 1-2 puff bid for next few days to help with SOB and Albuterol as needed. Call back if sx are still not better after finishing abt.  - doxycycline (VIBRA-TABS) 100 MG tablet; Take 1 tablet (100 mg total) by mouth 2 (two) times daily for 7 days.  Dispense: 14 tablet; Refill: 0  2. Drug-induced nausea and vomiting  - ondansetron (ZOFRAN) 4 MG tablet; Take 1 tablet (4 mg total) by mouth every 8 (eight) hours as needed for nausea or vomiting. Take prior to antibiotic.  Dispense: 20 tablet; Refill: 0  Subjective:    Outpatient Medications Prior to Visit  Medication Sig Dispense Refill   acetaminophen (TYLENOL) 325 MG tablet Take 650 mg by mouth every 6 (six) hours as needed for mild pain, moderate pain or headache.     albuterol (VENTOLIN HFA) 108 (90 Base) MCG/ACT inhaler Inhale 2 puffs into the lungs every 6 (six) hours as needed for wheezing or shortness of breath. 18 g 5   ALPRAZolam (XANAX) 1 MG tablet TAKE ONE TABLET BY MOUTH AT BEDTIME AS NEEDED FOR SLEEP -do not drive FOR EIGHT hours AFTER taking 90 tablet 1   amLODipine  (NORVASC) 10 MG tablet Take 1 tablet (10 mg total) by mouth daily. 90 tablet 2   anastrozole (ARIMIDEX) 1 MG tablet TAKE ONE TABLET DAILY 30 tablet 3   aspirin EC 81 MG tablet Take 1 tablet (81 mg total) by mouth daily. Swallow whole. 90 tablet 3   Bempedoic Acid-Ezetimibe (NEXLIZET) 180-10 MG TABS Take 180 mg by mouth daily. 90 tablet 3   budesonide-formoterol (SYMBICORT) 160-4.5 MCG/ACT inhaler Inhale 2 puffs into the lungs 2 (two) times daily. 1 each 11   carvedilol (COREG) 25 MG tablet Take 1 tablet (25 mg total) by mouth 2 (two) times daily with a meal. 180 tablet 3   famotidine (PEPCID) 20 MG tablet TAKE ONE TABLET TWICE DAILY 60 tablet 5   irbesartan (AVAPRO) 300 MG tablet Take 1 tablet (300 mg total) by mouth daily. 90 tablet 3   levocetirizine (XYZAL) 5 MG tablet TAKE ONE TABLET BY MOUTH EVERY MORNING 90 tablet 3   omega-3 acid ethyl esters (LOVAZA) 1 g capsule TAKE TWO CAPSULES EVERY DAY 180 capsule 3   spironolactone (ALDACTONE) 25 MG tablet Take 0.5 tablets (12.5 mg total) by mouth daily. 45 tablet 3   venlafaxine XR (EFFEXOR-XR) 75 MG 24 hr capsule TAKE ONE CAPSULE EACH DAY WITH BREAKFAST 90 capsule 2   No facility-administered medications  prior to visit.   Past Medical History:  Diagnosis Date   Adenomatous polyp 11/23/2005   Anxiety    01/09/22- patient denies anxiety   Asthma 2003   Basal cell carcinoma 2021   right cheek per patient at [place] [facility]   Carotid stenosis    Chronic diastolic CHF (congestive heart failure) (HCC) 2017   Echo 2021 was normal   COPD (chronic obstructive pulmonary disease) (HCC)    Coronary artery disease 2017   a. HC on 08/14/16 showed RCA CTO s/p PCI, 60% LCX disease managed medically with recs to consider staged PCI if continued symptoms.    Depression 2008   pt reports MD diagnosed but she does not feel depressed   Diabetes mellitus without complication (HCC)    DIVERTICULOSIS, COLON 04/22/2007        GERD (gastroesophageal reflux  disease) 2012   History of radiation therapy    Right breast- 03/04/22-04/02/22- Dr. Antony Blackbird   History of shingles    Hypertension 2008   Hypertensive heart disease    Insomnia 2003   Internal hemorrhoids    Osteoarthritis 2008   "knees, hands, lower back" (08/14/2016)   Pap smear for cervical cancer screening 2002   results normal   Personal history of radiation therapy    PUD (peptic ulcer disease) 1989   Seasonal allergies 2013   Sleep apnea    a. resolved with weight loss   Past Surgical History:  Procedure Laterality Date   BREAST BIOPSY Right 12/30/2021   BREAST EXCISIONAL BIOPSY Left    1980's x 3 - all benign   BREAST EXCISIONAL BIOPSY Right    1980's x 1 or 2 - all benign   BREAST LUMPECTOMY Right 01/22/2022   BREAST LUMPECTOMY WITH RADIOACTIVE SEED LOCALIZATION Right 01/22/2022   Procedure: RIGHT BREAST LUMPECTOMY WITH RADIOACTIVE SEED LOCALIZATION;  Surgeon: Harriette Bouillon, MD;  Location: [facility];  Service: General;  Laterality: Right;   BREAST SURGERY Left 1980s   Fibrous Tumors removed    CARDIAC CATHETERIZATION N/A 08/14/2016   Procedure: Left Heart Cath and Coronary Angiography;  Surgeon: Tonny Bollman, MD;  Location[facility] INVASIVE CV LAB;  Service: Cardiovascular;  Laterality: N/A;   CARDIAC CATHETERIZATION N/A 08/14/2016   Procedure: Coronary Stent Intervention;  Surgeon: Tonny Bollman, MD;  Location: [facility] INVASIVE CV LAB;  Service: Cardiovascular;  Laterality: N/A;   CATARACT EXTRACTION W/ INTRAOCULAR LENS  IMPLANT, BILATERAL Bilateral 05/2009   COLONOSCOPY N/A 2016   Cologuard 2019   CORONARY ANGIOPLASTY     detached retina left eye     may 2019   LEFT HEART CATH AND CORONARY ANGIOGRAPHY N/A 09/25/2022   Procedure: LEFT HEART CATH AND CORONARY ANGIOGRAPHY;  Surgeon: Kathleene Hazel, MD;  Location: [facility] INVASIVE CV LAB;  Service: Cardiovascular;  Laterality: N/A;   TUBAL LIGATION  1970s   Allergies  Allergen Reactions    Penicillins Anaphylaxis    REACTION: per patient causes rash,hives   Cephalosporins Swelling    REACTION: tongue swelling   Citalopram Hydrobromide Other (See Comments)    psychosis   Rosuvastatin Other (See Comments)    Pt reports causes bilateral lower extremity muscle aches.    Zolpidem Tartrate     REACTION: difficulty with concentration   Clarithromycin Rash    REACTION: blisters in mouth   Levofloxacin Rash    REACTION: tongue swells      Objective:    Physical Exam Vitals and nursing note reviewed.  Constitutional:  Appearance: Normal appearance.  Cardiovascular:     Rate and Rhythm: Normal rate and regular rhythm.  Pulmonary:     Effort: Pulmonary effort is normal.     Breath sounds: Examination of the right-upper field reveals rhonchi. Examination of the left-upper field reveals rhonchi. Rhonchi (mild) present.  Musculoskeletal:        General: Normal range of motion.  Skin:    General: Skin is warm and dry.  Neurological:     Mental Status: She is alert.  Psychiatric:        Mood and Affect: Mood normal.        Behavior: Behavior normal.    BP 121/72   Pulse 62   Temp (!) 97.5 F (36.4 C) (Temporal)   Ht 5' (1.524 m)   Wt 162 lb 2 oz (73.5 kg)   SpO2 97%   BMI 31.66 kg/m  Wt Readings from Last 3 Encounters:  06/22/23 162 lb 2 oz (73.5 kg)  05/13/23 159 lb 9.6 oz (72.4 kg)  04/05/23 158 lb 12.8 oz (72 kg)      Dulce Sellar, NP

## 2023-06-22 NOTE — Telephone Encounter (Signed)
Pharmacy Patient Advocate Encounter   Received notification from CoverMyMeds that prior authorization for NEXLIZET is required/requested.   Insurance verification completed.   The patient is insured through [facility]  .   Per test claim: PA required; PA submitted to [employer] [place] MEDIC[employer] via CoverMyMeds Key/confirmation #/EOC [id number] Status is pending

## 2023-06-23 ENCOUNTER — Other Ambulatory Visit (HOSPITAL_COMMUNITY): Payer: Self-pay

## 2023-06-23 NOTE — Telephone Encounter (Signed)
Pharmacy Patient Advocate Encounter  Received notification from [facility] MEDICARE  that Prior Authorization for NEXLIZET has been APPROVED from 06/22/23 to 06/21/24

## 2023-06-25 ENCOUNTER — Other Ambulatory Visit: Payer: Self-pay | Admitting: Hematology and Oncology

## 2023-06-25 ENCOUNTER — Telehealth: Payer: Self-pay | Admitting: Family Medicine

## 2023-06-25 MED ORDER — FLUCONAZOLE 150 MG PO TABS: ORAL_TABLET | ORAL | 0 refills | Status: DC

## 2023-06-25 NOTE — Telephone Encounter (Signed)
ok to send fluconazole, 150mg  x 2 doses 3 days apart = 2 pills, no refill -thanks

## 2023-06-25 NOTE — Telephone Encounter (Signed)
Pt came to our office on 06/22/23 and was RXed an antibiotic and now she has a yeast infection. She would like an RX for the issue. Please advise.

## 2023-06-29 ENCOUNTER — Encounter (HOSPITAL_COMMUNITY): Payer: Medicare Other

## 2023-06-29 NOTE — Telephone Encounter (Signed)
Noted  

## 2023-06-30 ENCOUNTER — Ambulatory Visit (HOSPITAL_COMMUNITY)
Admission: RE | Admit: 2023-06-30 | Discharge: 2023-06-30 | Disposition: A | Discharge status: Home / Self Care | Payer: Medicare Other | Source: Ambulatory Visit | Attending: Cardiovascular Disease | Admitting: Cardiovascular Disease

## 2023-06-30 DIAGNOSIS — I6529 Occlusion and stenosis of unspecified carotid artery: Secondary | ICD-10-CM | POA: Diagnosis not present

## 2023-06-30 DIAGNOSIS — I6523 Occlusion and stenosis of bilateral carotid arteries: Secondary | ICD-10-CM | POA: Insufficient documentation

## 2023-07-01 ENCOUNTER — Inpatient Hospital Stay: Payer: Medicare Other | Attending: Hematology and Oncology | Admitting: Hematology and Oncology

## 2023-07-01 VITALS — BP 121/60 | HR 72 | Temp 98.1°F | Resp 16 | Wt 161.5 lb

## 2023-07-01 DIAGNOSIS — D0511 Intraductal carcinoma in situ of right breast: Secondary | ICD-10-CM | POA: Insufficient documentation

## 2023-07-01 DIAGNOSIS — Z79811 Long term (current) use of aromatase inhibitors: Secondary | ICD-10-CM | POA: Diagnosis not present

## 2023-07-01 DIAGNOSIS — Z79899 Other long term (current) drug therapy: Secondary | ICD-10-CM | POA: Insufficient documentation

## 2023-07-01 DIAGNOSIS — Z13820 Encounter for screening for osteoporosis: Secondary | ICD-10-CM

## 2023-07-01 DIAGNOSIS — Z87891 Personal history of nicotine dependence: Secondary | ICD-10-CM | POA: Insufficient documentation

## 2023-07-01 DIAGNOSIS — M8580 Other specified disorders of bone density and structure, unspecified site: Secondary | ICD-10-CM | POA: Insufficient documentation

## 2023-07-01 DIAGNOSIS — Z923 Personal history of irradiation: Secondary | ICD-10-CM | POA: Diagnosis not present

## 2023-07-01 NOTE — Progress Notes (Signed)
BRIEF ONCOLOGIC HISTORY:  Oncology History  Ductal carcinoma in situ (DCIS) of right breast  12/18/2021 Cancer Staging   Staging form: Breast, AJCC 8th Edition - Clinical stage from 12/18/2021: Stage 0 (cTis (DCIS), cN0, cM0, ER+, PR+, HER2: Not Assessed) - Signed by Rachel Moulds, MD on 01/07/2022 Stage prefix: Initial diagnosis Nuclear grade: G2 Laterality: Right Stage used in treatment planning: Yes National guidelines used in treatment planning: Yes Type of national guideline used in treatment planning: NCCN   01/05/2022 Initial Diagnosis   Ductal carcinoma in situ (DCIS) of right breast    Genetic Testing   [facility] CancerNext Panel was Negative. Report date is 01/17/2022.  The CancerNext gene panel offered by [employer] includes sequencing, rearrangement analysis, and RNA analysis for the following 36 genes:   APC, ATM, AXIN2, BARD1, BMPR1A, BRCA1, BRCA2, BRIP1, CDH1, CDK4, CDKN2A, CHEK2, DICER1, HOXB13, EPCAM, GREM1, MLH1, MSH2, MSH3, MSH6, MUTYH, NBN, NF1, NTHL1, PALB2, PMS2, POLD1, POLE, PTEN, RAD51C, RAD51D, RECQL, SMAD4, SMARCA4, STK11, and TP53.    01/22/2022 Surgery   She had right breast lumpectomy pathology showed invasive well-differentiated grade 1 ductal carcinoma, extensive intermediate grade DCIS.  Invasive tumor measured 2 mm in greatest dimension, margins free prognostics on the invasive tumor showed ER +85% moderate staining, progesterone receptor negative, KI of less than 5% and HER2 negative. Oncotype not needed since her tumor is less than 5 mm   01/2022 -  Anti-estrogen oral therapy   Anastrozole   03/04/2022 - 04/02/2022 Radiation Therapy   Site Technique Total Dose (Gy) Dose per Fx (Gy) Completed Fx Beam Energies  Breast, Right: Breast_R 3D 40.05/40.05 2.67 15/15 10X, 6XFFF  Breast, Right: Breast_R_Bst 3D 12/12 2 6/6 6X, 10X       INTERVAL HISTORY  This is a very pleasant 77 year old female patient who is here for follow-up while on tamoxifen.  She has  been taking tamoxifen as instructed.  She denies any adverse effects at all.  She is very compliant with it.  She denies any major health changes.  No asymmetrical lower extremity swelling, vision changes or chest pain or shortness of breath Rest of the pertinent 10 point ROS reviewed and negative  REVIEW OF SYSTEMS:  Review of Systems  Constitutional:  Negative for appetite change, chills, fatigue, fever and unexpected weight change.  HENT:   Negative for hearing loss, lump/mass and trouble swallowing.   Eyes:  Negative for eye problems and icterus.  Respiratory:  Negative for chest tightness, cough and shortness of breath.   Cardiovascular:  Negative for chest pain, leg swelling and palpitations.  Gastrointestinal:  Negative for abdominal distention, abdominal pain, constipation, diarrhea, nausea and vomiting.  Endocrine: Negative for hot flashes.  Genitourinary:  Negative for difficulty urinating.   Musculoskeletal:  Negative for arthralgias.  Skin:  Negative for itching and rash.  Neurological:  Negative for dizziness, extremity weakness, headaches and numbness.  Hematological:  Negative for adenopathy. Does not bruise/bleed easily.  Psychiatric/Behavioral:  Negative for depression. The patient is not nervous/anxious.    Breast: Denies any new nodularity, masses, tenderness, nipple changes, or nipple discharge.   ONCOLOGY TREATMENT TEAM:  1. Surgeon:  Dr. Luisa Hart at [facility] 2. Medical Oncologist: Dr. Al Pimple  3. Radiation Oncologist: Dr. Roselind Messier    PAST MEDICAL/SURGICAL HISTORY:  Past Medical History:  Diagnosis Date   Adenomatous polyp 11/23/2005   Anxiety    01/09/22- patient denies anxiety   Asthma 2003   Basal cell carcinoma 2021   right cheek per  patient at [place] [facility]   Carotid stenosis    Chronic diastolic CHF (congestive heart failure) (HCC) 2017   Echo 2021 was normal   COPD (chronic obstructive pulmonary disease) (HCC)    Coronary artery  disease 2017   a. HC on 08/14/16 showed RCA CTO s/p PCI, 60% LCX disease managed medically with recs to consider staged PCI if continued symptoms.    Depression 2008   pt reports MD diagnosed but she does not feel depressed   Diabetes mellitus without complication (HCC)    DIVERTICULOSIS, COLON 04/22/2007        GERD (gastroesophageal reflux disease) 2012   History of radiation therapy    Right breast- 03/04/22-04/02/22- Dr. Antony Blackbird   History of shingles    Hypertension 2008   Hypertensive heart disease    Insomnia 2003   Internal hemorrhoids    Osteoarthritis 2008   "knees, hands, lower back" (08/14/2016)   Pap smear for cervical cancer screening 2002   results normal   Personal history of radiation therapy    PUD (peptic ulcer disease) 1989   Seasonal allergies 2013   Sleep apnea    a. resolved with weight loss   Past Surgical History:  Procedure Laterality Date   BREAST BIOPSY Right 12/30/2021   BREAST EXCISIONAL BIOPSY Left    1980's x 3 - all benign   BREAST EXCISIONAL BIOPSY Right    1980's x 1 or 2 - all benign   BREAST LUMPECTOMY Right 01/22/2022   BREAST LUMPECTOMY WITH RADIOACTIVE SEED LOCALIZATION Right 01/22/2022   Procedure: RIGHT BREAST LUMPECTOMY WITH RADIOACTIVE SEED LOCALIZATION;  Surgeon: Harriette Bouillon, MD;  Location: [facility];  Service: General;  Laterality: Right;   BREAST SURGERY Left 1980s   Fibrous Tumors removed    CARDIAC CATHETERIZATION N/A 08/14/2016   Procedure: Left Heart Cath and Coronary Angiography;  Surgeon: Tonny Bollman, MD;  Location[facility] INVASIVE CV LAB;  Service: Cardiovascular;  Laterality: N/A;   CARDIAC CATHETERIZATION N/A 08/14/2016   Procedure: Coronary Stent Intervention;  Surgeon: Tonny Bollman, MD;  Location: [facility] INVASIVE CV LAB;  Service: Cardiovascular;  Laterality: N/A;   CATARACT EXTRACTION W/ INTRAOCULAR LENS  IMPLANT, BILATERAL Bilateral 05/2009   COLONOSCOPY N/A 2016   Cologuard 2019   CORONARY  ANGIOPLASTY     detached retina left eye     may 2019   LEFT HEART CATH AND CORONARY ANGIOGRAPHY N/A 09/25/2022   Procedure: LEFT HEART CATH AND CORONARY ANGIOGRAPHY;  Surgeon: Kathleene Hazel, MD;  Location: [facility] INVASIVE CV LAB;  Service: Cardiovascular;  Laterality: N/A;   TUBAL LIGATION  1970s     ALLERGIES:  Allergies  Allergen Reactions   Penicillins Anaphylaxis    REACTION: per patient causes rash,hives   Cephalosporins Swelling    REACTION: tongue swelling   Citalopram Hydrobromide Other (See Comments)    psychosis   Rosuvastatin Other (See Comments)    Pt reports causes bilateral lower extremity muscle aches.    Zolpidem Tartrate     REACTION: difficulty with concentration   Clarithromycin Rash    REACTION: blisters in mouth   Levofloxacin Rash    REACTION: tongue swells     CURRENT MEDICATIONS:  Outpatient Encounter Medications as of 07/01/2023  Medication Sig   acetaminophen (TYLENOL) 325 MG tablet Take 650 mg by mouth every 6 (six) hours as needed for mild pain, moderate pain or headache.   albuterol (VENTOLIN HFA) 108 (90 Base) MCG/ACT inhaler Inhale 2 puffs into the  lungs every 6 (six) hours as needed for wheezing or shortness of breath.   ALPRAZolam (XANAX) 1 MG tablet TAKE ONE TABLET BY MOUTH AT BEDTIME AS NEEDED FOR SLEEP -do not drive FOR EIGHT hours AFTER taking   amLODipine (NORVASC) 10 MG tablet Take 1 tablet (10 mg total) by mouth daily.   anastrozole (ARIMIDEX) 1 MG tablet TAKE ONE TABLET DAILY   aspirin EC 81 MG tablet Take 1 tablet (81 mg total) by mouth daily. Swallow whole.   Bempedoic Acid-Ezetimibe (NEXLIZET) 180-10 MG TABS Take 180 mg by mouth daily.   budesonide-formoterol (SYMBICORT) 160-4.5 MCG/ACT inhaler Inhale 2 puffs into the lungs 2 (two) times daily.   carvedilol (COREG) 25 MG tablet Take 1 tablet (25 mg total) by mouth 2 (two) times daily with a meal.   famotidine (PEPCID) 20 MG tablet TAKE ONE TABLET TWICE DAILY   fluconazole  (DIFLUCAN) 150 MG tablet Take 1 pill today and the 2nd pill in 3 days.   irbesartan (AVAPRO) 300 MG tablet Take 1 tablet (300 mg total) by mouth daily.   levocetirizine (XYZAL) 5 MG tablet TAKE ONE TABLET BY MOUTH EVERY MORNING   omega-3 acid ethyl esters (LOVAZA) 1 g capsule TAKE TWO CAPSULES EVERY DAY   ondansetron (ZOFRAN) 4 MG tablet Take 1 tablet (4 mg total) by mouth every 8 (eight) hours as needed for nausea or vomiting. Take prior to antibiotic.   spironolactone (ALDACTONE) 25 MG tablet Take 0.5 tablets (12.5 mg total) by mouth daily.   venlafaxine XR (EFFEXOR-XR) 75 MG 24 hr capsule TAKE ONE CAPSULE EACH DAY WITH BREAKFAST   No facility-administered encounter medications on file as of 07/01/2023.     ONCOLOGIC FAMILY HISTORY:  Family History  Problem Relation Age of Onset   Heart disease Mother    Hypertension Mother    Diabetes Mother    Dementia Mother    COPD Father    Dementia Maternal Grandmother    Colon cancer Neg Hx      SOCIAL HISTORY:  Social History   Socioeconomic History   Marital status: Divorced    Spouse name: Not on file   Number of children: Not on file   Years of education: Not on file   Highest education level: Bachelor's degree (e.g., BA, AB, BS)  Occupational History   Occupation: Cleaning  Tobacco Use   Smoking status: Former    Current packs/day: 0.00    Average packs/day: 1 pack/day for 26.0 years (26.0 ttl pk-yrs)    Types: Cigarettes    Start date: 1963    Quit date: 1989    Years since quitting: 35.7   Smokeless tobacco: Never  Vaping Use   Vaping status: Never Used  Substance and Sexual Activity   Alcohol use: No   Drug use: No   Sexual activity: Not Currently  Other Topics Concern   Not on file  Social History Narrative   Divorced for 38 years in 2016. 2 sons- 1 son had been in prison now living with her in 2019.  2 granddaughters with 1 living close.        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 Best friend Fannie Knee patient of Dr. Durene Cal. Enjoys work, time on Animator.    Social Determinants of Health   Financial Resource Strain: Medium Risk (06/21/2023)  Overall Financial Resource Strain (CARDIA)    Difficulty of Paying Living Expenses: Somewhat hard  Food Insecurity: No Food Insecurity (06/21/2023)   Hunger Vital Sign    Worried About Running Out of Food in the Last Year: Never true    Ran Out of Food in the Last Year: Never true  Transportation Needs: No Transportation Needs (06/21/2023)   PRAPARE - Administrator, Civil Service (Medical): No    Lack of Transportation (Non-Medical): No  Physical Activity: Insufficiently Active (06/21/2023)   Exercise Vital Sign    Days of Exercise per Week: 3 days    Minutes of Exercise per Session: 30 min  Stress: Stress Concern Present (06/21/2023)   [name] of Occupational Health - Occupational Stress Questionnaire    Feeling of Stress : To some extent  Social Connections: Moderately Integrated (06/21/2023)   Social Connection and Isolation Panel [NHANES]    Frequency of Communication with Friends and Family: More than three times a week    Frequency of Social Gatherings with Friends and Family: Three times a week    Attends Religious Services: More than 4 times per year    Active Member of Clubs or Organizations: Yes    Attends Banker Meetings: More than 4 times per year    Marital Status: Divorced  Intimate Partner Violence: Not At Risk (04/05/2023)   Humiliation, Afraid, Rape, and Kick questionnaire    Fear of Current or Ex-Partner: No    Emotionally Abused: No    Physically Abused: No    Sexually Abused: No     OBSERVATIONS/OBJECTIVE:  BP 121/60 (BP Location: Right Arm, Patient Position: Sitting)   Pulse 72   Temp 98.1 F (36.7 C) (Oral)   Resp 16   Wt 161 lb 8 oz (73.3 kg)    SpO2 98%   BMI 31.54 kg/m   GENERAL: Patient is a well appearing female in no acute distress Skin: Scattered ecchymosis on forearms Breast: Bilateral breasts inspected.  Near the right breast lumpectomy scar there is a small seroma.  No other palpable masses.  No regional adenopathy.  Left breast normal to inspection and palpation.  LABORATORY DATA:  None for this visit.  DIAGNOSTIC IMAGING:  None for this visit.   ASSESSMENT AND PLAN:   Ms.. Blumenshine is a pleasant 77 y.o. female with Stage 0 right breast DCIS, ER+/PR+, diagnosed in February 2023, treated with lumpectomy, adjuvant radiation therapy, and anti-estrogen therapy with anastrozole beginning in October 2023.  Last mammogram in January 2024 with no evidence of recurrence, no evidence of malignancy in either breast. Last bone density scan which showed osteopenia with a T-score of -2.4, very close to being osteoporotic. She denies any new health complaints today. Physical examination today without any concerns for recurrence. Continue anastrozole as recommended, she has been tolerating it extremely well. With regards to osteoporosis and osteopenia, we have discussed about considering bone strengthening exercises such as weight lifting, vitamin D supplementation and calcium as tolerated.  We have today once again discussed about role of bisphosphonates, adverse effects including osteonecrosis of the jaw and the need for dental clearance.  Return to clinic in 6 months or sooner as needed.   Total encounter time:30 minutes*in face-to-face visit time, chart review, lab review, care coordination, order entry, and documentation of the encounter time.  *Total Encounter Time as defined by the Centers for Medicare and Medicaid Services includes, in addition to the face-to-face time of a patient visit (documented in  the note above) non-face-to-face time: obtaining and reviewing outside history, ordering and reviewing medications, tests or  procedures, care coordination (communications with other health care professionals or caregivers) and documentation in the medical record.

## 2023-07-08 ENCOUNTER — Telehealth: Payer: Self-pay | Admitting: *Deleted

## 2023-07-08 ENCOUNTER — Encounter: Payer: Self-pay | Admitting: *Deleted

## 2023-07-08 NOTE — Telephone Encounter (Addendum)
-----   Message from Naknek Iruku sent at 07/01/2023  3:21 PM EDT ----- Can we request dental clearance from Dr Eula Flax, [place] [address]  Thanks,  Dental clearance faxed

## 2023-07-08 NOTE — Telephone Encounter (Signed)
-----   Message from Arimo Iruku sent at 07/01/2023  3:21 PM EDT ----- Can we request dental clearance from Dr Eula Flax, dentist [place] [address].  Thanks,

## 2023-07-12 NOTE — Progress Notes (Unsigned)
[name] [address] [place] [address] Phone: [phone] Subjective:   [name], am serving as a scribe for Dr. Antoine Primas.  I'm seeing this patient by the request  of:  Shelva Majestic, MD  CC: neck pain follow up   [id number]  03/09/2023 We discussed the possibility of osteopathic manipulation but feels like she is more concerned with the balance and coordination difficulties she is having. Patient has been worked up relatively completely for her vertigo-like symptoms. We discussed with patient that we will get patient into physical therapy that I think will be helpful for the balance and coordination. Total time with patient today 31 minutes. Patient does have significant number of life stressors that is likely playing a role including some difficulties with her family.   Updated 07/13/2023 Felicia Perry is a 77 y.o. female coming in with complaint of neck pain. Neck is doing ok. Dizziness has come back after a recent illness. Rehab helped to diminish those symptoms. Dizziness occurring when she turns over in bed and when she gets up to go to the bathroom.   B knee stiffness with change in weather.   We have not seen patient in quite some time.  Since we have seen patient she has been diagnosed with breast cancer.  Undergone a lumpectomy as well as antiestrogen therapy in October 2023.  Last mammogram was in January 2024 that showed no reoccurrence.   Bone density does show patient is nearly osteoporotic  Past Medical History:  Diagnosis Date   Adenomatous polyp 11/23/2005   Anxiety    01/09/22- patient denies anxiety   Asthma 2003   Basal cell carcinoma 2021   right cheek per patient at [facility]   Carotid stenosis    Chronic diastolic CHF (congestive heart failure) (HCC) 2017   Echo 2021 was normal   COPD (chronic obstructive pulmonary disease) (HCC)    Coronary artery disease 2017   a. HC on 08/14/16 showed  RCA CTO s/p PCI, 60% LCX disease managed medically with recs to consider staged PCI if continued symptoms.    Depression 2008   pt reports MD diagnosed but she does not feel depressed   Diabetes mellitus without complication (HCC)    DIVERTICULOSIS, COLON 04/22/2007        GERD (gastroesophageal reflux disease) 2012   History of radiation therapy    Right breast- 03/04/22-04/02/22- Dr. Antony Blackbird   History of shingles    Hypertension 2008   Hypertensive heart disease    Insomnia 2003   Internal hemorrhoids    Osteoarthritis 2008   "knees, hands, lower back" (08/14/2016)   Pap smear for cervical cancer screening 2002   results normal   Personal history of radiation therapy    PUD (peptic ulcer disease) 1989   Seasonal allergies 2013   Sleep apnea    a. resolved with weight loss   Past Surgical History:  Procedure Laterality Date   BREAST BIOPSY Right 12/30/2021   BREAST EXCISIONAL BIOPSY Left    1980's x 3 - all benign   BREAST EXCISIONAL BIOPSY Right    1980's x 1 or 2 - all benign   BREAST LUMPECTOMY Right 01/22/2022   BREAST LUMPECTOMY WITH RADIOACTIVE SEED LOCALIZATION Right 01/22/2022   Procedure: RIGHT BREAST LUMPECTOMY WITH RADIOACTIVE SEED LOCALIZATION;  Surgeon: Harriette Bouillon, MD;  Location: [facility];  Service: General;  Laterality: Right;   BREAST SURGERY Left 1980s  Fibrous Tumors removed    CARDIAC CATHETERIZATION N/A 08/14/2016   Procedure: Left Heart Cath and Coronary Angiography;  Surgeon: Tonny Bollman, MD;  Location: [facility] INVASIVE CV LAB;  Service: Cardiovascular;  Laterality: N/A;   CARDIAC CATHETERIZATION N/A 08/14/2016   Procedure: Coronary Stent Intervention;  Surgeon: Tonny Bollman, MD;  Location: [facility] INVASIVE CV LAB;  Service: Cardiovascular;  Laterality: N/A;   CATARACT EXTRACTION W/ INTRAOCULAR LENS  IMPLANT, BILATERAL Bilateral 05/2009   COLONOSCOPY N/A 2016   Cologuard 2019   CORONARY ANGIOPLASTY     detached retina left eye      may 2019   LEFT HEART CATH AND CORONARY ANGIOGRAPHY N/A 09/25/2022   Procedure: LEFT HEART CATH AND CORONARY ANGIOGRAPHY;  Surgeon: Kathleene Hazel, MD;  Location: [facility] INVASIVE CV LAB;  Service: Cardiovascular;  Laterality: N/A;   TUBAL LIGATION  1970s   Social History   Socioeconomic History   Marital status: Divorced    Spouse name: Not on file   Number of children: Not on file   Years of education: Not on file   Highest education level: Bachelor's degree (e.g., BA, AB, BS)  Occupational History   Occupation: Cleaning  Tobacco Use   Smoking status: Former    Current packs/day: 0.00    Average packs/day: 1 pack/day for 26.0 years (26.0 ttl pk-yrs)    Types: Cigarettes    Start date: 1963    Quit date: 1989    Years since quitting: 35.7   Smokeless tobacco: Never  Vaping Use   Vaping status: Never Used  Substance and Sexual Activity   Alcohol use: No   Drug use: No   Sexual activity: Not Currently  Other Topics Concern   Not on file  Social History Narrative   Divorced for 38 years in 2016. 2 sons- 1 son had been in prison now living with her in 2019.  2 granddaughters with 1 living close.        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 Best friend Fannie Knee patient of Dr. Durene Cal. Enjoys work, time on Animator.    Social Determinants of Health   Financial Resource Strain: Medium Risk (06/21/2023)   Overall Financial Resource Strain (CARDIA)    Difficulty of Paying Living Expenses: Somewhat hard  Food Insecurity: No Food Insecurity (06/21/2023)   Hunger Vital Sign    Worried About Running Out of Food in the Last Year: Never true    Ran Out of Food in the Last Year: Never true  Transportation Needs: No Transportation Needs (06/21/2023)   PRAPARE - Administrator, Civil Service (Medical): No    Lack of  Transportation (Non-Medical): No  Physical Activity: Insufficiently Active (06/21/2023)   Exercise Vital Sign    Days of Exercise per Week: 3 days    Minutes of Exercise per Session: 30 min  Stress: Stress Concern Present (06/21/2023)   [name] of Occupational Health - Occupational Stress Questionnaire    Feeling of Stress : To some extent  Social Connections: Moderately Integrated (06/21/2023)   Social Connection and Isolation Panel [NHANES]    Frequency of Communication with Friends and Family: More than three times a week    Frequency of Social Gatherings with Friends and Family: Three times a week    Attends Religious Services: More than 4 times  per year    Active Member of Clubs or Organizations: Yes    Attends Banker Meetings: More than 4 times per year    Marital Status: Divorced   Allergies  Allergen Reactions   Penicillins Anaphylaxis    REACTION: per patient causes rash,hives   Cephalosporins Swelling    REACTION: tongue swelling   Citalopram Hydrobromide Other (See Comments)    psychosis   Rosuvastatin Other (See Comments)    Pt reports causes bilateral lower extremity muscle aches.    Zolpidem Tartrate     REACTION: difficulty with concentration   Clarithromycin Rash    REACTION: blisters in mouth   Levofloxacin Rash    REACTION: tongue swells   Family History  Problem Relation Age of Onset   Heart disease Mother    Hypertension Mother    Diabetes Mother    Dementia Mother    COPD Father    Dementia Maternal Grandmother    Colon cancer Neg Hx     Current Outpatient Medications (Endocrine & Metabolic):    predniSONE (DELTASONE) 20 MG tablet, Take 1 tablet (20 mg total) by mouth daily with breakfast.  Current Outpatient Medications (Cardiovascular):    amLODipine (NORVASC) 10 MG tablet, Take 1 tablet (10 mg total) by mouth daily.   Bempedoic Acid-Ezetimibe (NEXLIZET) 180-10 MG TABS, Take 180 mg by mouth daily.   carvedilol (COREG) 25  MG tablet, Take 1 tablet (25 mg total) by mouth 2 (two) times daily with a meal.   irbesartan (AVAPRO) 300 MG tablet, Take 1 tablet (300 mg total) by mouth daily.   omega-3 acid ethyl esters (LOVAZA) 1 g capsule, TAKE TWO CAPSULES EVERY DAY   spironolactone (ALDACTONE) 25 MG tablet, Take 0.5 tablets (12.5 mg total) by mouth daily.  Current Outpatient Medications (Respiratory):    albuterol (VENTOLIN HFA) 108 (90 Base) MCG/ACT inhaler, Inhale 2 puffs into the lungs every 6 (six) hours as needed for wheezing or shortness of breath.   budesonide-formoterol (SYMBICORT) 160-4.5 MCG/ACT inhaler, Inhale 2 puffs into the lungs 2 (two) times daily.   levocetirizine (XYZAL) 5 MG tablet, TAKE ONE TABLET BY MOUTH EVERY MORNING  Current Outpatient Medications (Analgesics):    acetaminophen (TYLENOL) 325 MG tablet, Take 650 mg by mouth every 6 (six) hours as needed for mild pain, moderate pain or headache.   aspirin EC 81 MG tablet, Take 1 tablet (81 mg total) by mouth daily. Swallow whole.   Current Outpatient Medications (Other):    ALPRAZolam (XANAX) 1 MG tablet, TAKE ONE TABLET BY MOUTH AT BEDTIME AS NEEDED FOR SLEEP -do not drive FOR EIGHT hours AFTER taking   anastrozole (ARIMIDEX) 1 MG tablet, TAKE ONE TABLET DAILY   famotidine (PEPCID) 20 MG tablet, TAKE ONE TABLET TWICE DAILY   fluconazole (DIFLUCAN) 150 MG tablet, Take 1 pill today and the 2nd pill in 3 days.   ondansetron (ZOFRAN) 4 MG tablet, Take 1 tablet (4 mg total) by mouth every 8 (eight) hours as needed for nausea or vomiting. Take prior to antibiotic.   venlafaxine XR (EFFEXOR-XR) 75 MG 24 hr capsule, TAKE ONE CAPSULE EACH DAY WITH BREAKFAST   Reviewed prior external information including notes and imaging from  primary care provider As well as notes that were available from care everywhere and other healthcare systems.  Past medical history, social, surgical and family history all reviewed in electronic medical record.  No pertanent  information unless stated regarding to the chief complaint.   Review of Systems:  No headache, visual changes, nausea, vomiting, diarrhea, constipation, abdominal pain, skin rash, fevers, chills, night sweats, weight loss, swollen lymph nodes,  chest pain, shortness of breath, mood changes. POSITIVE muscle aches, body aches, joint swelling and dizziness  Objective  Blood pressure (!) 142/82, pulse 64, height 5' (1.524 m), weight 164 lb (74.4 kg), SpO2 98%.   General: No apparent distress alert and oriented x3 mood and affect normal, dressed appropriately.  HEENT: Pupils equal, extraocular movements intact  Respiratory: Patient's speak in full sentences and does not appear short of breath  Cardiovascular: No lower extremity edema, non tender, no erythema  Patient back exam does have some loss of lordosis noted.  Neck exam does have some arthritic changes noted as well. Some tenderness over the sacroiliac joint.   Impression and Recommendations:    The above documentation has been reviewed and is accurate and complete Judi Saa, DO

## 2023-07-13 ENCOUNTER — Ambulatory Visit: Payer: Medicare Other | Admitting: Family Medicine

## 2023-07-13 ENCOUNTER — Encounter: Payer: Self-pay | Admitting: Family Medicine

## 2023-07-13 VITALS — BP 142/82 | HR 64 | Ht 60.0 in | Wt 164.0 lb

## 2023-07-13 DIAGNOSIS — M5441 Lumbago with sciatica, right side: Secondary | ICD-10-CM | POA: Diagnosis not present

## 2023-07-13 DIAGNOSIS — M5030 Other cervical disc degeneration, unspecified cervical region: Secondary | ICD-10-CM

## 2023-07-13 DIAGNOSIS — G8929 Other chronic pain: Secondary | ICD-10-CM

## 2023-07-13 DIAGNOSIS — R42 Dizziness and giddiness: Secondary | ICD-10-CM

## 2023-07-13 DIAGNOSIS — M533 Sacrococcygeal disorders, not elsewhere classified: Secondary | ICD-10-CM | POA: Diagnosis not present

## 2023-07-13 MED ORDER — PREDNISONE 20 MG PO TABS: 20.0000 mg | ORAL_TABLET | Freq: Every day | ORAL | 0 refills | Status: DC

## 2023-07-13 NOTE — Assessment & Plan Note (Signed)
Degenerative disc disease of the cervical spine, knees as well as the lower back.  Patient has had some dizziness as well and I do think that this is still more of a postviral and not secondary to the concussion.  Patient knows if worsening symptoms to seek medical attention but I do feel patient will do well with formal physical therapy for the other ailments.  We will refer her today.  She is looking to become more active.  Discussed icing regimen and home exercises, which activities to do and which ones to avoid.  Follow-up again in 6 to 8 weeks

## 2023-07-13 NOTE — Patient Instructions (Addendum)
PT referral again to [facility]  Prednisone 20mg  for 7 days See you again in 2-3 months

## 2023-07-14 ENCOUNTER — Telehealth: Payer: Self-pay | Admitting: *Deleted

## 2023-07-14 NOTE — Telephone Encounter (Signed)
None

## 2023-07-15 NOTE — Progress Notes (Deleted)
Cardiology Office Note:  .   Date:  07/15/2023  ID:  Felicia Perry, DOB [birth year], MRN [id number] PCP: Shelva Majestic, MD  [facility] Providers Cardiologist:  Meriam Sprague, MD { Click to update primary MD,subspecialty MD or APP then REFRESH:1}   History of Present Illness: .   Felicia Perry is a 77 y.o. female with a past medical history of CAD (diagnosis 2017-RCA CTO status post complex PCI, 60% LCx disease, normal LVEF 10/17), chronic diastolic CHF, carotid artery disease, HLD with statin intolerance, HTN, PUD, asthma, anxiety who is here for follow-up.  Per review of records patient was seen by Dr. Delton See October 2017 for evaluation of chest pain and a positive stress test which was ordered by her PCP.  This demonstrated a hypotensive response to exercise and medium defect of moderate severity present in the basal inferior septal, mid inferior septal and apical septal and mid to apical inferior location that was reversible and consistent with ischemia.  LHC on 08/14/2016 showed RCA with CTO status post complex PCI, 60% LCx disease managed medically with recs to consider staged PCI continued symptoms.  Myoview 01/2017 was a low restudy.  TTE 01/2017 showed LVEF 55 to 60%, elevated filling pressures, mild to moderate AI.  Last Myoview 02/15/2020 with EF 71%, mild defect in apex with no ischemia.  TTE 02/15/2020 with LVEF 65%, severe LVH, mild MR, aortic sclerosis.  Carotids 06/27/2020 with R ICA 1 to 49% and LICA 40 to 59%.  Carotid ultrasound 06/2022 with 60 to 78% in the LICA and 1 to 39% in the R ICA.  Seen in the clinic October 2023 I was having episodes of SOB with exertion.  Obtained a cardiac PET which showed mid inferior lateral ischemia, 3 times daily and lack of augmentation of systolic function with stress.  MV FFR was normal.  Discussion held with the patient and given known LCx disease and symptoms, decision was made to proceed with cardiac cath.  LHC 09/2022 with  stable 60% LCx, 10% RCA, 60% OM 2, patent distal RCA stent, normal LVEDP.  1 she was last seen by Dr. Shari Prows 12/2022 she continued to have intermittent dyspnea on exertion.  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  She does feel like she gets winded if she walks across a parking lot.  Notably, symptoms only occur walking outside.  She has underlying asthma and thinks it could be contributing.  Otherwise doing well from a CV standpoint.  Today, she***  ROS: ***  Studies Reviewed: Marland Kitchen        LHC 09/2022:   Ost RCA to Prox RCA lesion is 10% stenosed.   Prox Cx to Mid Cx lesion is 60% stenosed.   Mid Cx to Dist Cx lesion is 40% stenosed.   2nd Mrg lesion is 60% stenosed.   Previously placed Dist RCA stent of unknown type is  widely patent.   The left ventricular systolic function is normal.   LV end diastolic pressure is normal.   The left ventricular ejection fraction is greater than 65% by visual estimate.   There is no mitral valve regurgitation.   The LAD is a moderate to large caliber vessel that does not wrap around the apex. No angiographic evidence of disease in this vessel. Moderate caliber first diagonal branch with mild plaque.  The Circumflex is a large caliber vessel with two moderate caliber obtuse marginal branches. There is moderate disease in the AV groove Circumflex just beyond  the takeoff of the first obtuse marginal branch and moderate disease at the takeoff of the second obtuse marginal branch. These lesions do not appear to be flow limiting.  The RCA is a large dominant artery with patent proximal, mid and distal stents with minimal restenosis.  Normal LVEDP Normal LV systolic function   Recommendations: Stable CAD with no flow limiting lesions. Moderate stenosis in the Circumflex should not be contributing to her dyspnea. Normal LV filling pressure with preserved LV systolic function. Explore other reasons for dyspnea.    NM PET 08/2022:  Findings are consistent  with a small region of ischemia. The study is intermediate-to-high risk due to the presence of TID and lack of augmentation of systolic function with stress. There is very mild ischemia noted and normal myocardial blood flow reserve.   LV perfusion is abnormal. Defect 1: There is a small defect with mild reduction in uptake present in the mid inferolateral location(s) that is reversible. There is abnormal wall motion in the defect area. Consistent with ischemia.   Rest left ventricular function is normal. Rest EF: 64 %. Stress left ventricular function is normal, but there was no augmentation in systolic function when compared to rest. Stress EF: 64 %. End diastolic cavity size is normal. End systolic cavity size is normal.   Myocardial blood flow was computed to be 0.62ml/g/min at rest and 2.82ml/g/min at stress. Global myocardial blood flow reserve was 3.00 and was normal.   Coronary calcium was present on the attenuation correction CT images. Severe coronary calcifications were present. Coronary calcifications were present in the left anterior descending artery, left circumflex artery and right coronary artery distribution(s).   Carotids 06/26/22: Summary:  Right Carotid: Velocities in the right ICA are consistent with a 1-39%  stenosis.   Left Carotid: Velocities in the left ICA are consistent with a 60-79%  stenosis.                Non-hemodynamically significant plaque <50% noted in the  CCA. The                ECA appears >50% stenosed.   Vertebrals:  Bilateral vertebral arteries demonstrate antegrade flow.  Subclavians: Normal flow hemodynamics were seen in bilateral subclavian               arteries.    Carotids 06/27/21 Right Carotid: Velocities in the right ICA are consistent with a 1-39%  stenosis.   Left Carotid: Velocities in the left ICA are consistent with a 40-59%  stenosis. Non-hemodynamically significant plaque <50% noted in the  CCA.   Vertebrals:  Bilateral vertebral  arteries demonstrate antegrade flow.  Subclavians: Normal flow hemodynamics were seen in bilateral subclavian arteries.   CTA Head/Neck 01/02/21 IMPRESSION: 1. No intracranial arterial occlusion or high-grade stenosis. 2. 60 % stenosis of the proximal left internal carotid artery secondary to predominantly calcified atherosclerosis.   Aortic Atherosclerosis (ICD10-I70.0).   Myoview 02/15/20: 08/12/16 for evaluation of chest pain and a positive stress test which was ordered by PCP. This demonstrated a hypotensive response to exercise and a medium defect of moderate severity present in the basal inferoseptal, mid inferoseptal and apical septal and mid and apical inferior location that was reversible and consistent with ischemia.  LHC on 08/14/16 showed RCA CTO s/p complex PCI, 60% LCX disease managed medically with recs to consider staged PCI if continued symptoms. Last stress test 01/2017 was low risk study. Echo 01/2017 showed LVEF of 55-60%, elevated filling pressure, mild  to moderate AI.   TTE 02/15/20: IMPRESSIONS   1. Left ventricular ejection fraction, by estimation, is 60 to 65%. The  left ventricle has normal function. The left ventricle has no regional  wall motion abnormalities. The left ventricular internal cavity size was  mildly dilated. There is severe left ventricular hypertrophy. Left ventricular diastolic parameters were normal.   2. Right ventricular systolic function is normal. The right ventricular  size is normal. There is mildly elevated pulmonary artery systolic  pressure.   3. The mitral valve is normal in structure. Mild mitral valve  regurgitation. No evidence of mitral stenosis.   4. The aortic valve is tricuspid. Aortic valve regurgitation is mild.  Sclerosis with small gradient but no stenosis.   5. The inferior vena cava is normal in size with greater than 50%  respiratory variability, suggesting right atrial pressure of 3 mmHg.    Carotid 06/27/20: Summary:   Right Carotid: Velocities in the right ICA are consistent with a 1-39%  stenosis.   Left Carotid: Velocities in the left ICA are consistent with a 40-59%  stenosis. Non-hemodynamically significant plaque <50% noted in the  CCA.   Vertebrals:  Bilateral vertebral arteries demonstrate antegrade flow.   Subclavians: Normal flow hemodynamics were seen in bilateral subclavian arteries.    Risk Assessment/Calculations:   {Does this patient have ATRIAL FIBRILLATION?:[phone]} No BP recorded.  {Refresh Note OR Click here to enter BP  :1}***   STOP-Bang Score:  4  { Consider Dx Sleep Disordered Breathing or Sleep Apnea  ICD G47.33          :1}    Physical Exam:   VS:  There were no vitals taken for this visit.   Wt Readings from Last 3 Encounters:  07/13/23 164 lb (74.4 kg)  07/01/23 161 lb 8 oz (73.3 kg)  06/22/23 162 lb 2 oz (73.5 kg)    GEN: Well nourished, well developed in no acute distress NECK: No JVD; No carotid bruits CARDIAC: ***RRR, no murmurs, rubs, gallops RESPIRATORY:  Clear to auscultation without rales, wheezing or rhonchi  ABDOMEN: Soft, non-tender, non-distended EXTREMITIES:  No edema; No deformity   ASSESSMENT AND PLAN: .   1 CAD 2 HFpEF 3 SOB 4 mild aortic stenosis 5 mild MR 6 stenosis of carotid artery 7 hypertension 8 obesity    {Are you ordering a CV Procedure (e.g. stress test, cath, DCCV, TEE, etc)?   Press F2        :[id number]}  Dispo: ***  Signed, Sharlene Dory, PA-C

## 2023-07-16 ENCOUNTER — Ambulatory Visit: Payer: Medicare Other | Admitting: Physician Assistant

## 2023-07-16 DIAGNOSIS — I25119 Atherosclerotic heart disease of native coronary artery with unspecified angina pectoris: Secondary | ICD-10-CM

## 2023-07-16 DIAGNOSIS — E669 Obesity, unspecified: Secondary | ICD-10-CM

## 2023-07-16 DIAGNOSIS — I5032 Chronic diastolic (congestive) heart failure: Secondary | ICD-10-CM

## 2023-07-16 DIAGNOSIS — I340 Nonrheumatic mitral (valve) insufficiency: Secondary | ICD-10-CM

## 2023-07-16 DIAGNOSIS — I350 Nonrheumatic aortic (valve) stenosis: Secondary | ICD-10-CM

## 2023-07-16 DIAGNOSIS — I10 Essential (primary) hypertension: Secondary | ICD-10-CM

## 2023-07-16 DIAGNOSIS — I6529 Occlusion and stenosis of unspecified carotid artery: Secondary | ICD-10-CM

## 2023-07-16 DIAGNOSIS — R0602 Shortness of breath: Secondary | ICD-10-CM

## 2023-07-20 ENCOUNTER — Ambulatory Visit: Payer: Medicare Other | Attending: Family Medicine | Admitting: Physical Therapy

## 2023-07-20 NOTE — Therapy (Deleted)
OUTPATIENT PHYSICAL THERAPY THORACOLUMBAR EVALUATION   Patient Name: Felicia Perry MRN: [id number] DOB:09-28-1946, 77 y.o., female Today's Date: 07/20/2023  END OF SESSION:   Past Medical History:  Diagnosis Date   Adenomatous polyp 11/23/2005   Anxiety    01/09/22- patient denies anxiety   Asthma 2003   Basal cell carcinoma 2021   right cheek per patient at [place] [facility]   Carotid stenosis    Chronic diastolic CHF (congestive heart failure) (HCC) 2017   Echo 2021 was normal   COPD (chronic obstructive pulmonary disease) (HCC)    Coronary artery disease 2017   a. HC on 08/14/16 showed RCA CTO s/p PCI, 60% LCX disease managed medically with recs to consider staged PCI if continued symptoms.    Depression 2008   pt reports MD diagnosed but she does not feel depressed   Diabetes mellitus without complication (HCC)    DIVERTICULOSIS, COLON 04/22/2007        GERD (gastroesophageal reflux disease) 2012   History of radiation therapy    Right breast- 03/04/22-04/02/22- Dr. Antony Blackbird   History of shingles    Hypertension 2008   Hypertensive heart disease    Insomnia 2003   Internal hemorrhoids    Osteoarthritis 2008   "knees, hands, lower back" (08/14/2016)   Pap smear for cervical cancer screening 2002   results normal   Personal history of radiation therapy    PUD (peptic ulcer disease) 1989   Seasonal allergies 2013   Sleep apnea    a. resolved with weight loss   Past Surgical History:  Procedure Laterality Date   BREAST BIOPSY Right 12/30/2021   BREAST EXCISIONAL BIOPSY Left    1980's x 3 - all benign   BREAST EXCISIONAL BIOPSY Right    1980's x 1 or 2 - all benign   BREAST LUMPECTOMY Right 01/22/2022   BREAST LUMPECTOMY WITH RADIOACTIVE SEED LOCALIZATION Right 01/22/2022   Procedure: RIGHT BREAST LUMPECTOMY WITH RADIOACTIVE SEED LOCALIZATION;  Surgeon: Harriette Bouillon, MD;  Location: [facility];  Service: General;  Laterality: Right;    BREAST SURGERY Left 1980s   Fibrous Tumors removed    CARDIAC CATHETERIZATION N/A 08/14/2016   Procedure: Left Heart Cath and Coronary Angiography;  Surgeon: Tonny Bollman, MD;  Location: [facility] INVASIVE CV LAB;  Service: Cardiovascular;  Laterality: N/A;   CARDIAC CATHETERIZATION N/A 08/14/2016   Procedure: Coronary Stent Intervention;  Surgeon: Tonny Bollman, MD;  Location: [facility] INVASIVE CV LAB;  Service: Cardiovascular;  Laterality: N/A;   CATARACT EXTRACTION W/ INTRAOCULAR LENS  IMPLANT, BILATERAL Bilateral 05/2009   COLONOSCOPY N/A 2016   Cologuard 2019   CORONARY ANGIOPLASTY     detached retina left eye     may 2019   LEFT HEART CATH AND CORONARY ANGIOGRAPHY N/A 09/25/2022   Procedure: LEFT HEART CATH AND CORONARY ANGIOGRAPHY;  Surgeon: Kathleene Hazel, MD;  Location: [facility] INVASIVE CV LAB;  Service: Cardiovascular;  Laterality: N/A;   TUBAL LIGATION  1970s   Patient Active Problem List   Diagnosis Date Noted   Shortness of breath 09/25/2022   Genetic testing 01/19/2022   Ductal carcinoma in situ (DCIS) of right breast 01/05/2022   Diabetes mellitus type 2 with complications (HCC) 02/05/2021   Intractable headache 01/02/2021   Greater trochanteric bursitis of left hip 08/13/2020   Senile purpura (HCC) 08/07/2020   statin myalgia 06/25/2020   Carotid stenosis 01/23/2020   Chronic diastolic CHF (congestive heart failure) (HCC) 01/12/2019   Degenerative arthritis of  knee, bilateral 12/08/2018   Partial hamstring tear, initial encounter 11/16/2018   Hyperlipidemia associated with type 2 diabetes mellitus (HCC) 07/11/2018   Nonallopathic lesion of sacral region 07/05/2018   Nonallopathic lesion of cervical region 07/05/2018   DDD (degenerative disc disease), cervical 05/04/2018   Degenerative arthritis of left knee 09/15/2017   H/O hyperparathyroidism 08/18/2016   Coronary artery disease involving native coronary artery of native heart without angina pectoris    PUD (peptic  ulcer disease)    Syncope 08/04/2016   Vitamin D deficiency 08/20/2015   Hyperglycemia 08/06/2015   Xerosis of skin 08/06/2015   Hypersomnia with sleep apnea 03/26/2015   Chronic pain syndrome 03/26/2015   Nonallopathic lesion of lumbosacral region 11/13/2014   Osteoarthritis of left lower extremity 10/17/2014   Chronic meniscal tear of knee 10/17/2014   Nonallopathic lesion of thoracic region 09/14/2014   Nonallopathic lesion-rib cage 08/21/2014   Tendinopathy of right rotator cuff 07/30/2014   Piriformis syndrome of right side 07/30/2014   Insomnia 07/13/2014   Limited joint range of motion 07/13/2014   Rash 07/13/2014   GERD (gastroesophageal reflux disease) 02/25/2011   SI (sacroiliac) joint dysfunction 10/10/2007   LOW BACK PAIN 07/01/2007   Depression 04/22/2007   Essential hypertension 04/22/2007   Asthma 04/22/2007    PCP: Tana Conch  REFERRING PROVIDER: Judi Saa, DO  REFERRING DIAG: R42 (ICD-10-CM) - Dizziness M54.41,G89.29 (ICD-10-CM) - Chronic bilateral low back pain with right-sided sciatica  Rationale for Evaluation and Treatment: Rehabilitation  THERAPY DIAG:  No diagnosis found.  ONSET DATE: ***  SUBJECTIVE:                                                                                                                                                                                           SUBJECTIVE STATEMENT: ***  PERTINENT HISTORY:  ***  PAIN:  Are you having pain? Yes: NPRS scale: ***/10 Pain location: *** Pain description: *** Aggravating factors: *** Relieving factors: ***  PRECAUTIONS: {Therapy precautions:24002}  RED FLAGS: {PT Red Flags:29287}   WEIGHT BEARING RESTRICTIONS: {Yes ***/No:24003}  FALLS:  Has patient fallen in last 6 months? {fallsyesno:27318}  LIVING ENVIRONMENT: Lives with: {OPRC lives with:25569::"lives with their family"} Lives in: {Lives in:25570} Stairs: {opstairs:27293} Has following  equipment at home: {Assistive devices:23999}  OCCUPATION: ***  PLOF: Leisure: ***  PATIENT GOALS: ***  NEXT MD VISIT: ***  OBJECTIVE:   DIAGNOSTIC FINDINGS:  ***  PATIENT SURVEYS:  {rehab surveys:24030}  SCREENING FOR RED FLAGS: Bowel or bladder incontinence: {Yes/No:304960894} Spinal tumors: {Yes/No:304960894} Cauda equina syndrome: {Yes/No:304960894} Compression fracture: {Yes/No:304960894} Abdominal aneurysm: {Yes/No:304960894}  COGNITION: Overall cognitive status: {cognition:24006}  SENSATION: {sensation:27233}  MUSCLE LENGTH: Hamstrings: Right *** deg; Left *** deg Maisie Fus test: Right *** deg; Left *** deg  POSTURE: {posture:25561}  PALPATION: ***  LUMBAR ROM:   AROM eval  Flexion   Extension   Right lateral flexion   Left lateral flexion   Right rotation   Left rotation    (Blank rows = not tested)  LOWER EXTREMITY ROM:     {AROM/PROM:27142}  Right eval Left eval  Hip flexion    Hip extension    Hip abduction    Hip adduction    Hip internal rotation    Hip external rotation    Knee flexion    Knee extension    Ankle dorsiflexion    Ankle plantarflexion    Ankle inversion    Ankle eversion     (Blank rows = not tested)  LOWER EXTREMITY MMT:    MMT Right eval Left eval  Hip flexion    Hip extension    Hip abduction    Hip adduction    Hip internal rotation    Hip external rotation    Knee flexion    Knee extension    Ankle dorsiflexion    Ankle plantarflexion    Ankle inversion    Ankle eversion     (Blank rows = not tested)  LUMBAR SPECIAL TESTS:  {lumbar special test:25242}  FUNCTIONAL TESTS:  {Functional tests:24029}  GAIT: Distance walked: *** Assistive device utilized: {Assistive devices:23999} Level of assistance: {Levels of assistance:24026} Comments: ***  TODAY'S TREATMENT:                                                                                                                               DATE: 07/20/2023: HEP established    PATIENT EDUCATION:  Education details: *** Person educated: Patient Education method: Programmer, multimedia, Facilities manager, and Handouts Education comprehension: verbalized understanding, returned demonstration, and needs further education  HOME EXERCISE PROGRAM: ***  ASSESSMENT:  CLINICAL IMPRESSION: Patient is a 77 y.o. female who was seen today for physical therapy evaluation and treatment for chronic bilateral back pain and dizziness. ***   OBJECTIVE IMPAIRMENTS: {opptimpairments:25111}.   ACTIVITY LIMITATIONS: {activitylimitations:27494}  PARTICIPATION LIMITATIONS: {participationrestrictions:25113}  PERSONAL FACTORS: {Personal factors:25162} are also affecting patient's functional outcome.   REHAB POTENTIAL: {rehabpotential:25112}  CLINICAL DECISION MAKING: {clinical decision making:25114}  EVALUATION COMPLEXITY: {Evaluation complexity:25115}   GOALS: Goals reviewed with patient? Yes  SHORT TERM GOALS: Target date: 08/17/2023 Patient will be independent with initial HEP. Baseline:  Goal status: INITIAL  2.  Patient will report > or = to *** improvement in ***. Baseline:  Goal status: INITIAL   3.  *** Baseline:  Goal status: INITIAL  4.  *** Baseline:  Goal status: INITIAL  5.  *** Baseline:  Goal status: INITIAL  6.  *** Baseline:  Goal status: INITIAL  LONG TERM GOALS: Target date: 09/14/2023 Patient will be independent with advanced HEP. Baseline:  Goal status: INITIAL  2.  Patient will report > or = to *** improvement in ***. Baseline:  Goal status: INITIAL   3.  *** Baseline:  Goal status: INITIAL  4.  *** Baseline:  Goal status: INITIAL  5.  *** Baseline:  Goal status: INITIAL  6.  *** Baseline:  Goal status: INITIAL  PLAN:  PT FREQUENCY: {rehab frequency:25116}  PT DURATION: {rehab duration:25117}  PLANNED INTERVENTIONS: Therapeutic exercises, Therapeutic activity, Neuromuscular  re-education, Balance training, Gait training, Patient/Family education, Self Care, Joint mobilization, Joint manipulation, Stair training, Vestibular training, Canalith repositioning, Aquatic Therapy, Dry Needling, Electrical stimulation, Spinal manipulation, Spinal mobilization, Cryotherapy, Moist heat, scar mobilization, Taping, Vasopneumatic device, Traction, Ultrasound, Ionotophoresis 4mg /ml Dexamethasone, and Manual therapy.  PLAN FOR NEXT SESSION: ***   Montel Clock 07/20/2023, 7:45 AM

## 2023-07-27 ENCOUNTER — Ambulatory Visit: Payer: Medicare Other | Admitting: Physical Therapy

## 2023-08-09 ENCOUNTER — Encounter: Payer: Medicare Other | Admitting: Pharmacist

## 2023-08-30 ENCOUNTER — Other Ambulatory Visit: Payer: Self-pay | Admitting: Family Medicine

## 2023-09-13 NOTE — Progress Notes (Unsigned)
[name] [address] [place] [address] Phone: [phone] Subjective:   Felicia Perry, am serving as a scribe for Dr. Antoine Primas.  I'm seeing this patient by the request  of:  Shelva Majestic, MD  CC: Neck and back pain follow-up  FAO:[id number]  07/13/2023 Degenerative disc disease of the cervical spine, knees as well as the lower back.  Patient has had some dizziness as well and I do think that this is still more of a postviral and not secondary to the concussion.  Patient knows if worsening symptoms to seek medical attention but I do feel patient will do well with formal physical therapy for the other ailments.  We will refer her today.  She is looking to become more active.  Discussed icing regimen and home exercises, which activities to do and which ones to avoid.  Follow-up again in 6 to 8 weeks      Update 09/14/2023 Felicia Perry is a 77 y.o. female coming in with complaint of cervical spine pain. Patient states that her neck pain is manageable when she stretches.   Does not feel like Effexor is working. Does not feel motivated and is sleeping a lot.        Past Medical History:  Diagnosis Date   Adenomatous polyp 11/23/2005   Anxiety    01/09/22- patient denies anxiety   Asthma 2003   Basal cell carcinoma 2021   right cheek per patient at [place] [facility]   Carotid stenosis    Chronic diastolic CHF (congestive heart failure) (HCC) 2017   Echo 2021 was normal   COPD (chronic obstructive pulmonary disease) (HCC)    Coronary artery disease 2017   a. HC on 08/14/16 showed RCA CTO s/p PCI, 60% LCX disease managed medically with recs to consider staged PCI if continued symptoms.    Depression 2008   pt reports MD diagnosed but she does not feel depressed   Diabetes mellitus without complication (HCC)    DIVERTICULOSIS, COLON 04/22/2007        GERD (gastroesophageal reflux disease) 2012   History of radiation therapy     Right breast- 03/04/22-04/02/22- Dr. Antony Blackbird   History of shingles    Hypertension 2008   Hypertensive heart disease    Insomnia 2003   Internal hemorrhoids    Osteoarthritis 2008   "knees, hands, lower back" (08/14/2016)   Pap smear for cervical cancer screening 2002   results normal   Personal history of radiation therapy    PUD (peptic ulcer disease) 1989   Seasonal allergies 2013   Sleep apnea    a. resolved with weight loss   Past Surgical History:  Procedure Laterality Date   BREAST BIOPSY Right 12/30/2021   BREAST EXCISIONAL BIOPSY Left    1980's x 3 - all benign   BREAST EXCISIONAL BIOPSY Right    1980's x 1 or 2 - all benign   BREAST LUMPECTOMY Right 01/22/2022   BREAST LUMPECTOMY WITH RADIOACTIVE SEED LOCALIZATION Right 01/22/2022   Procedure: RIGHT BREAST LUMPECTOMY WITH RADIOACTIVE SEED LOCALIZATION;  Surgeon: Harriette Bouillon, MD;  Location: [facility];  Service: General;  Laterality: Right;   BREAST SURGERY Left 1980s   Fibrous Tumors removed    CARDIAC CATHETERIZATION N/A 08/14/2016   Procedure: Left Heart Cath and Coronary Angiography;  Surgeon: Tonny Bollman, MD;  [facility] INVASIVE CV LAB;  Service: Cardiovascular;  Laterality: N/A;   CARDIAC CATHETERIZATION N/A 08/14/2016  Procedure: Coronary Stent Intervention;  Surgeon: Tonny Bollman, MD;  Location: [facility] INVASIVE CV LAB;  Service: Cardiovascular;  Laterality: N/A;   CATARACT EXTRACTION W/ INTRAOCULAR LENS  IMPLANT, BILATERAL Bilateral 05/2009   COLONOSCOPY N/A 2016   Cologuard 2019   CORONARY ANGIOPLASTY     detached retina left eye     may 2019   LEFT HEART CATH AND CORONARY ANGIOGRAPHY N/A 09/25/2022   Procedure: LEFT HEART CATH AND CORONARY ANGIOGRAPHY;  Surgeon: Kathleene Hazel, MD;  Location: [facility] INVASIVE CV LAB;  Service: Cardiovascular;  Laterality: N/A;   TUBAL LIGATION  1970s   Social History   Socioeconomic History   Marital status: Divorced    Spouse name:  Not on file   Number of children: Not on file   Years of education: Not on file   Highest education level: Bachelor's degree (e.g., BA, AB, BS)  Occupational History   Occupation: Cleaning  Tobacco Use   Smoking status: Former    Current packs/day: 0.00    Average packs/day: 1 pack/day for 26.0 years (26.0 ttl pk-yrs)    Types: Cigarettes    Start date: 1963    Quit date: 1989    Years since quitting: 35.9   Smokeless tobacco: Never  Vaping Use   Vaping status: Never Used  Substance and Sexual Activity   Alcohol use: No   Drug use: No   Sexual activity: Not Currently  Other Topics Concern   Not on file  Social History Narrative   Divorced for 38 years in 2016. 2 sons- 1 son had been in prison now living with her in 2019.  2 granddaughters with 1 living close.        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 Best friend Felicia Perry patient of Dr. Durene Cal. Enjoys work, time on Animator.    Social Determinants of Health   Financial Resource Strain: Medium Risk (06/21/2023)   Overall Financial Resource Strain (CARDIA)    Difficulty of Paying Living Expenses: Somewhat hard  Food Insecurity: No Food Insecurity (06/21/2023)   Hunger Vital Sign    Worried About Running Out of Food in the Last Year: Never true    Ran Out of Food in the Last Year: Never true  Transportation Needs: No Transportation Needs (06/21/2023)   PRAPARE - Administrator, Civil Service (Medical): No    Lack of Transportation (Non-Medical): No  Physical Activity: Insufficiently Active (06/21/2023)   Exercise Vital Sign    Days of Exercise per Week: 3 days    Minutes of Exercise per Session: 30 min  Stress: Stress Concern Present (06/21/2023)   [name] of Occupational Health - Occupational Stress Questionnaire    Feeling of Stress : To some extent  Social  Connections: Moderately Integrated (06/21/2023)   Social Connection and Isolation Panel [NHANES]    Frequency of Communication with Friends and Family: More than three times a week    Frequency of Social Gatherings with Friends and Family: Three times a week    Attends Religious Services: More than 4 times per year    Active Member of Clubs or Organizations: Yes    Attends Banker Meetings: More than 4 times per year    Marital Status: Divorced   Allergies  Allergen Reactions   Penicillins Anaphylaxis  REACTION: per patient causes rash,hives   Cephalosporins Swelling    REACTION: tongue swelling   Citalopram Hydrobromide Other (See Comments)    psychosis   Rosuvastatin Other (See Comments)    Pt reports causes bilateral lower extremity muscle aches.    Zolpidem Tartrate     REACTION: difficulty with concentration   Clarithromycin Rash    REACTION: blisters in mouth   Levofloxacin Rash    REACTION: tongue swells   Family History  Problem Relation Age of Onset   Heart disease Mother    Hypertension Mother    Diabetes Mother    Dementia Mother    COPD Father    Dementia Maternal Grandmother    Colon cancer Neg Hx     Current Outpatient Medications (Endocrine & Metabolic):    predniSONE (DELTASONE) 20 MG tablet, Take 1 tablet (20 mg total) by mouth daily with breakfast.  Current Outpatient Medications (Cardiovascular):    amLODipine (NORVASC) 10 MG tablet, Take 1 tablet (10 mg total) by mouth daily.   Bempedoic Acid-Ezetimibe (NEXLIZET) 180-10 MG TABS, Take 180 mg by mouth daily.   carvedilol (COREG) 25 MG tablet, Take 1 tablet (25 mg total) by mouth 2 (two) times daily with a meal.   irbesartan (AVAPRO) 300 MG tablet, Take 1 tablet (300 mg total) by mouth daily.   omega-3 acid ethyl esters (LOVAZA) 1 g capsule, TAKE TWO CAPSULES EVERY DAY   spironolactone (ALDACTONE) 25 MG tablet, Take 0.5 tablets (12.5 mg total) by mouth daily.  Current Outpatient  Medications (Respiratory):    albuterol (VENTOLIN HFA) 108 (90 Base) MCG/ACT inhaler, Inhale 2 puffs into the lungs every 6 (six) hours as needed for wheezing or shortness of breath.   budesonide-formoterol (SYMBICORT) 160-4.5 MCG/ACT inhaler, Inhale 2 puffs into the lungs 2 (two) times daily.   levocetirizine (XYZAL) 5 MG tablet, TAKE ONE TABLET BY MOUTH EVERY MORNING  Current Outpatient Medications (Analgesics):    acetaminophen (TYLENOL) 325 MG tablet, Take 650 mg by mouth every 6 (six) hours as needed for mild pain, moderate pain or headache.   aspirin EC 81 MG tablet, Take 1 tablet (81 mg total) by mouth daily. Swallow whole.   Current Outpatient Medications (Other):    ALPRAZolam (XANAX) 1 MG tablet, TAKE ONE TABLET BY MOUTH AT BEDTIME AS NEEDED FOR SLEEP -do not drive FOR EIGHT hours AFTER taking   anastrozole (ARIMIDEX) 1 MG tablet, TAKE ONE TABLET DAILY   famotidine (PEPCID) 20 MG tablet, TAKE ONE TABLET TWICE DAILY   fluconazole (DIFLUCAN) 150 MG tablet, Take 1 pill today and the 2nd pill in 3 days.   ondansetron (ZOFRAN) 4 MG tablet, Take 1 tablet (4 mg total) by mouth every 8 (eight) hours as needed for nausea or vomiting. Take prior to antibiotic.   venlafaxine XR (EFFEXOR-XR) 75 MG 24 hr capsule, TAKE ONE CAPSULE DAILY WITH BREAKFAST   Reviewed prior external information including notes and imaging from  primary care provider As well as notes that were available from care everywhere and other healthcare systems.  Past medical history, social, surgical and family history all reviewed in electronic medical record.  No pertanent information unless stated regarding to the chief complaint.   Review of Systems:  No headache, visual changes, nausea, vomiting, diarrhea, constipation, dizziness, abdominal pain, skin rash, fevers, chills, night sweats, weight loss, swollen lymph nodes, body aches, joint swelling, chest pain, shortness of breath, mood changes. POSITIVE muscle  aches  Objective  Blood pressure 124/82, pulse 64, height  5' (1.524 m), weight 166 lb (75.3 kg), SpO2 98%.   General: No apparent distress alert and oriented x3 mood and affect normal, dressed appropriately.  HEENT: Pupils equal, extraocular movements intact  Respiratory: Patient's speak in full sentences and does not appear short of breath  Cardiovascular: No lower extremity edema, non tender, no erythema  Of the low back exam does have some loss lordosis noted.  Some increasing kyphosis of the upper and lower back.  Osteopathic findings  T3 extended rotated and side bent right inhaled third rib T9 extended rotated and side bent left L2 flexed rotated and side bent right Sacrum right on right     Impression and Recommendations:     SI (sacroiliac) joint dysfunction Sacroiliac dysfunction.  The patient wanted to see if potentially changing from the Effexor would be beneficial.  At this point we will hold on making any other significant changes though.  Discussed that this could be not a good timing with the holidays which can be a rough time a year for this individual.  We will keep the Effexor at the 75 mg for now and will reassess in January.  Continue to work on core strengthening.  Discussed which activities to do and which ones to avoid otherwise.  Follow-up again in 6 to 8 weeks.    Decision today to treat with OMT was based on Physical Exam  After verbal consent patient was treated with HVLA, ME, FPR techniques in  thoracic, rib, lumbar and sacral areas, all areas are chronic   Patient tolerated the procedure well with improvement in symptoms  Patient given exercises, stretches and lifestyle modifications  See medications in patient instructions if given  Patient will follow up in 4-8 weeks   The above documentation has been reviewed and is accurate and complete Judi Saa, DO

## 2023-09-14 ENCOUNTER — Ambulatory Visit: Payer: Medicare Other | Admitting: Family Medicine

## 2023-09-14 ENCOUNTER — Encounter: Payer: Self-pay | Admitting: Family Medicine

## 2023-09-14 VITALS — BP 124/82 | HR 64 | Ht 60.0 in | Wt 166.0 lb

## 2023-09-14 DIAGNOSIS — M9903 Segmental and somatic dysfunction of lumbar region: Secondary | ICD-10-CM | POA: Diagnosis not present

## 2023-09-14 DIAGNOSIS — M9904 Segmental and somatic dysfunction of sacral region: Secondary | ICD-10-CM

## 2023-09-14 DIAGNOSIS — M9902 Segmental and somatic dysfunction of thoracic region: Secondary | ICD-10-CM

## 2023-09-14 DIAGNOSIS — M533 Sacrococcygeal disorders, not elsewhere classified: Secondary | ICD-10-CM | POA: Diagnosis not present

## 2023-09-14 NOTE — Assessment & Plan Note (Signed)
Sacroiliac dysfunction.  The patient wanted to see if potentially changing from the Effexor would be beneficial.  At this point we will hold on making any other significant changes though.  Discussed that this could be not a good timing with the holidays which can be a rough time a year for this individual.  We will keep the Effexor at the 75 mg for now and will reassess in January.  Continue to work on core strengthening.  Discussed which activities to do and which ones to avoid otherwise.  Follow-up again in 6 to 8 weeks.

## 2023-09-14 NOTE — Patient Instructions (Addendum)
[facility] [address], [place] [address] Directions  [phone]  Hours of Operation Mondays to Thursdays: 8 am to 8 pm Fridays: 9 am to 8 pm Saturdays: 9 am to 1 pm Sundays: Closed  See you again in 2 months and we'll discuss medications

## 2023-09-20 ENCOUNTER — Other Ambulatory Visit: Payer: Self-pay

## 2023-09-20 DIAGNOSIS — Z79899 Other long term (current) drug therapy: Secondary | ICD-10-CM

## 2023-09-20 DIAGNOSIS — I10 Essential (primary) hypertension: Secondary | ICD-10-CM

## 2023-09-20 DIAGNOSIS — I5032 Chronic diastolic (congestive) heart failure: Secondary | ICD-10-CM

## 2023-09-20 DIAGNOSIS — R0602 Shortness of breath: Secondary | ICD-10-CM

## 2023-09-20 DIAGNOSIS — R0609 Other forms of dyspnea: Secondary | ICD-10-CM

## 2023-09-20 DIAGNOSIS — I25119 Atherosclerotic heart disease of native coronary artery with unspecified angina pectoris: Secondary | ICD-10-CM

## 2023-09-20 MED ORDER — CARVEDILOL 25 MG PO TABS: 25.0000 mg | ORAL_TABLET | Freq: Two times a day (BID) | ORAL | 1 refills | Status: DC

## 2023-09-20 MED ORDER — SPIRONOLACTONE 25 MG PO TABS: 12.5000 mg | ORAL_TABLET | Freq: Every day | ORAL | 1 refills | Status: DC
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
## 2023-10-04 ENCOUNTER — Encounter: Payer: Self-pay | Admitting: Physician Assistant

## 2023-10-04 ENCOUNTER — Ambulatory Visit: Payer: Medicare Other | Attending: Physician Assistant | Admitting: Physician Assistant

## 2023-10-04 VITALS — BP 116/60 | HR 68 | Ht 60.0 in | Wt 167.4 lb

## 2023-10-04 DIAGNOSIS — E6609 Other obesity due to excess calories: Secondary | ICD-10-CM

## 2023-10-04 DIAGNOSIS — R0609 Other forms of dyspnea: Secondary | ICD-10-CM

## 2023-10-04 DIAGNOSIS — I10 Essential (primary) hypertension: Secondary | ICD-10-CM | POA: Diagnosis not present

## 2023-10-04 DIAGNOSIS — I25119 Atherosclerotic heart disease of native coronary artery with unspecified angina pectoris: Secondary | ICD-10-CM | POA: Diagnosis not present

## 2023-10-04 DIAGNOSIS — R0602 Shortness of breath: Secondary | ICD-10-CM

## 2023-10-04 DIAGNOSIS — I6523 Occlusion and stenosis of bilateral carotid arteries: Secondary | ICD-10-CM

## 2023-10-04 DIAGNOSIS — R400 Somnolence: Secondary | ICD-10-CM

## 2023-10-04 DIAGNOSIS — E785 Hyperlipidemia, unspecified: Secondary | ICD-10-CM

## 2023-10-04 DIAGNOSIS — I5032 Chronic diastolic (congestive) heart failure: Secondary | ICD-10-CM

## 2023-10-04 DIAGNOSIS — Z6832 Body mass index (BMI) 32.0-32.9, adult: Secondary | ICD-10-CM

## 2023-10-04 DIAGNOSIS — Z79899 Other long term (current) drug therapy: Secondary | ICD-10-CM

## 2023-10-04 DIAGNOSIS — E66811 Obesity, class 1: Secondary | ICD-10-CM

## 2023-10-04 NOTE — Telephone Encounter (Addendum)
I s/w the pt and she tells me that she completely forgot about doing her sleep study. This was ordered back on 09/15/22. I confirmed with Sleep coordinator Coralee North if authorization was still good. Coralee North confirmed yes. I called the pt and gave her the PIN #[id number]. I asked the pt if the app was still oner phone. Pt said she did not know how to find that. She will have her daughter-in-law help her. I gave the name of the app watchpatone to download on her phone if not still on her phone. I also gave the pt the [phone] if she has any questions. Pt thanked me for the call and the help.

## 2023-10-04 NOTE — Progress Notes (Unsigned)
[facility] [place]  ASSESSMENT / PLAN: 77 y.o. female with *** - ***  CHIEF COMPLAINT: ***  HISTORY OF PRESENT ILLNESS: Felicia Perry is a 77 y.o. female ***  VASCULAR SURGICAL HISTORY: ***  VASCULAR RISK FACTORS: {FINDINGS; POSITIVE NEGATIVE:[phone]} history of stroke / transient ischemic attack. {FINDINGS; POSITIVE NEGATIVE:[phone]} history of coronary artery disease. *** history of PCI. *** history of CABG.  {FINDINGS; POSITIVE NEGATIVE:[phone]} history of diabetes mellitus. Last A1c ***. {FINDINGS; POSITIVE NEGATIVE:[phone]} history of smoking. *** actively smoking. {FINDINGS; POSITIVE NEGATIVE:[phone]} history of hypertension. *** drug regimen with *** control. {FINDINGS; POSITIVE NEGATIVE:[phone]} history of chronic kidney disease.  Last GFR ***. CKD {stage:30421363}. {FINDINGS; POSITIVE NEGATIVE:[phone]} history of chronic obstructive pulmonary disease, treated with ***.  FUNCTIONAL STATUS: ECOG performance status: {findings; ecog performance status:31780} Ambulatory status: {TNHAmbulation:25868}  CAREY 1 AND 3 YEAR INDEX Female (2pts) 75-79 or 80-84 (2pts) >84 (3pts) Dependence in toileting (1pt) Partial or full dependence in dressing (1pt) History of malignant neoplasm (2pts) CHF (3pts) COPD (1pts) CKD (3pts)  0-3 pts 6% 1 year mortality ; 21% 3 year mortality 4-5 pts 12% 1 year mortality ; 36% 3 year mortality >5 pts 21% 1 year mortality; 54% 3 year mortality   Past Medical History:  Diagnosis Date   Adenomatous polyp 11/23/2005   Anxiety    01/09/22- patient denies anxiety   Asthma 2003   Basal cell carcinoma 2021   right cheek per patient at [facility]   Carotid stenosis    Chronic diastolic CHF (congestive heart failure) (HCC) 2017   Echo 2021 was normal   COPD (chronic obstructive pulmonary disease) (HCC)    Coronary artery disease 2017   a. HC on 08/14/16 showed RCA CTO s/p PCI, 60% LCX disease  managed medically with recs to consider staged PCI if continued symptoms.    Depression 2008   pt reports MD diagnosed but she does not feel depressed   Diabetes mellitus without complication (HCC)    DIVERTICULOSIS, COLON 04/22/2007        GERD (gastroesophageal reflux disease) 2012   History of radiation therapy    Right breast- 03/04/22-04/02/22- Dr. Antony Blackbird   History of shingles    Hypertension 2008   Hypertensive heart disease    Insomnia 2003   Internal hemorrhoids    Osteoarthritis 2008   "knees, hands, lower back" (08/14/2016)   Pap smear for cervical cancer screening 2002   results normal   Personal history of radiation therapy    PUD (peptic ulcer disease) 1989   Seasonal allergies 2013   Sleep apnea    a. resolved with weight loss    Past Surgical History:  Procedure Laterality Date   BREAST BIOPSY Right 12/30/2021   BREAST EXCISIONAL BIOPSY Left    1980's x 3 - all benign   BREAST EXCISIONAL BIOPSY Right    1980's x 1 or 2 - all benign   BREAST LUMPECTOMY Right 01/22/2022   BREAST LUMPECTOMY WITH RADIOACTIVE SEED LOCALIZATION Right 01/22/2022   Procedure: RIGHT BREAST LUMPECTOMY WITH RADIOACTIVE SEED LOCALIZATION;  Surgeon: Harriette Bouillon, MD;  Location: [facility];  Service: General;  Laterality: Right;   BREAST SURGERY Left 1980s   Fibrous Tumors removed    CARDIAC CATHETERIZATION N/A 08/14/2016   Procedure: Left Heart Cath and Coronary Angiography;  Surgeon: Tonny Bollman, MD;  Location: [facility] INVASIVE CV LAB;  Service: Cardiovascular;  Laterality: N/A;   CARDIAC CATHETERIZATION N/A 08/14/2016   Procedure: Coronary Stent Intervention;  Surgeon: Tonny Bollman, MD;  Location: [facility] INVAS[facility] CV LAB;  Service: Cardiovascular;  Laterality: N/A;   CATARACT EXTRACTION W/ INTRAOCULAR LENS  IMPLANT, BILATERAL Bilateral 05/2009   COLONOSCOPY N/A 2016   Cologuard 2019   CORONARY ANGIOPLASTY     detached retina left eye     may 2019   LEFT HEART CATH  AND CORONARY ANGIOGRAPHY N/A 09/25/2022   Procedure: LEFT HEART CATH AND CORONARY ANGIOGRAPHY;  Surgeon: Kathleene Hazel, MD;  Location: [facility] INVASIVE CV LAB;  Service: Cardiovascular;  Laterality: N/A;   TUBAL LIGATION  1970s    Family History  Problem Relation Age of Onset   Heart disease Mother    Hypertension Mother    Diabetes Mother    Dementia Mother    COPD Father    Dementia Maternal Grandmother    Colon cancer Neg Hx     Social History   Socioeconomic History   Marital status: Divorced    Spouse name: Not on file   Number of children: Not on file   Years of education: Not on file   Highest education level: Bachelor's degree (e.g., BA, AB, BS)  Occupational History   Occupation: Cleaning  Tobacco Use   Smoking status: Former    Current packs/day: 0.00    Average packs/day: 1 pack/day for 26.0 years (26.0 ttl pk-yrs)    Types: Cigarettes    Start date: 1963    Quit date: 1989    Years since quitting: 35.9   Smokeless tobacco: Never  Vaping Use   Vaping status: Never Used  Substance and Sexual Activity   Alcohol use: No   Drug use: No   Sexual activity: Not Currently  Other Topics Concern   Not on file  Social History Narrative   Divorced for 38 years in 2016. 2 sons- 1 son had been in prison now living with her in 2019.  2 granddaughters with 1 living close.        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 Best friend Fannie Knee patient of Dr. Durene Cal. Enjoys work, time on Animator.    Social Determinants of Health   Financial Resource Strain: Medium Risk (06/21/2023)   Overall Financial Resource Strain (CARDIA)    Difficulty of Paying Living Expenses: Somewhat hard  Food Insecurity: No Food Insecurity (06/21/2023)   Hunger Vital Sign    Worried About Running Out of Food in the Last Year: Never true    Ran Out of  Food in the Last Year: Never true  Transportation Needs: No Transportation Needs (06/21/2023)   PRAPARE - Administrator, Civil Service (Medical): No    Lack of Transportation (Non-Medical): No  Physical Activity: Insufficiently Active (06/21/2023)   Exercise Vital Sign    Days of Exercise per Week: 3 days    Minutes of Exercise per Session: 30 min  Stress: Stress Concern Present (06/21/2023)   [name] of Occupational Health - Occupational Stress Questionnaire    Feeling of Stress : To some extent  Social Connections: Moderately Integrated (06/21/2023)   Social Connection and Isolation Panel [NHANES]    Frequency of Communication with Friends and Family: More than three times a week    Frequency of Social Gatherings with Friends and Family: Three times a week    Attends Religious Services: More than  4 times per year    Active Member of Clubs or Organizations: Yes    Attends Banker Meetings: More than 4 times per year    Marital Status: Divorced  Intimate Partner Violence: Not At Risk (04/05/2023)   Humiliation, Afraid, Rape, and Kick questionnaire    Fear of Current or Ex-Partner: No    Emotionally Abused: No    Physically Abused: No    Sexually Abused: No    Allergies  Allergen Reactions   Penicillins Anaphylaxis    REACTION: per patient causes rash,hives   Cephalosporins Swelling    REACTION: tongue swelling   Citalopram Hydrobromide Other (See Comments)    psychosis   Rosuvastatin Other (See Comments)    Pt reports causes bilateral lower extremity muscle aches.    Zolpidem Tartrate     REACTION: difficulty with concentration   Clarithromycin Rash    REACTION: blisters in mouth   Levofloxacin Rash    REACTION: tongue swells    Current Outpatient Medications  Medication Sig Dispense Refill   acetaminophen (TYLENOL) 325 MG tablet Take 650 mg by mouth every 6 (six) hours as needed for mild pain, moderate pain or headache.     albuterol  (VENTOLIN HFA) 108 (90 Base) MCG/ACT inhaler Inhale 2 puffs into the lungs every 6 (six) hours as needed for wheezing or shortness of breath. 18 g 5   ALPRAZolam (XANAX) 1 MG tablet TAKE ONE TABLET BY MOUTH AT BEDTIME AS NEEDED FOR SLEEP -do not drive FOR EIGHT hours AFTER taking 90 tablet 1   amLODipine (NORVASC) 10 MG tablet Take 1 tablet (10 mg total) by mouth daily. 90 tablet 2   anastrozole (ARIMIDEX) 1 MG tablet TAKE ONE TABLET DAILY 30 tablet 3   aspirin EC 81 MG tablet Take 1 tablet (81 mg total) by mouth daily. Swallow whole. 90 tablet 3   Bempedoic Acid-Ezetimibe (NEXLIZET) 180-10 MG TABS Take 180 mg by mouth daily. 90 tablet 3   budesonide-formoterol (SYMBICORT) 160-4.5 MCG/ACT inhaler Inhale 2 puffs into the lungs 2 (two) times daily. 1 each 11   carvedilol (COREG) 25 MG tablet Take 1 tablet (25 mg total) by mouth 2 (two) times daily with a meal. 180 tablet 1   famotidine (PEPCID) 20 MG tablet TAKE ONE TABLET TWICE DAILY 60 tablet 5   fluconazole (DIFLUCAN) 150 MG tablet Take 1 pill today and the 2nd pill in 3 days. 2 tablet 0   irbesartan (AVAPRO) 300 MG tablet Take 1 tablet (300 mg total) by mouth daily. 90 tablet 3   levocetirizine (XYZAL) 5 MG tablet TAKE ONE TABLET BY MOUTH EVERY MORNING 90 tablet 3   omega-3 acid ethyl esters (LOVAZA) 1 g capsule TAKE TWO CAPSULES EVERY DAY 180 capsule 3   ondansetron (ZOFRAN) 4 MG tablet Take 1 tablet (4 mg total) by mouth every 8 (eight) hours as needed for nausea or vomiting. Take prior to antibiotic. 20 tablet 0   predniSONE (DELTASONE) 20 MG tablet Take 1 tablet (20 mg total) by mouth daily with breakfast. 7 tablet 0   spironolactone (ALDACTONE) 25 MG tablet Take 0.5 tablets (12.5 mg total) by mouth daily. 45 tablet 1   venlafaxine XR (EFFEXOR-XR) 75 MG 24 hr capsule TAKE ONE CAPSULE DAILY WITH BREAKFAST 90 capsule 2   No current facility-administered medications for this visit.    PHYSICAL EXAM There were no vitals filed for this  visit.  Constitutional: *** appearing. *** distress. Appears *** nourished.  Neurologic: CN ***. ***  focal findings. *** sensory loss. Psychiatric: *** Mood and affect symmetric and appropriate. Eyes: *** No icterus. No conjunctival pallor. Ears, nose, throat: *** mucous membranes moist. Midline trachea.  Cardiac: *** rate and rhythm.  Respiratory: *** unlabored. Abdominal: *** soft, non-tender, non-distended.  Peripheral vascular: *** Extremity: *** edema. *** cyanosis. *** pallor.  Skin: *** gangrene. *** ulceration.  Lymphatic: *** Stemmer's sign. *** palpable lymphadenopathy.    PERTINENT LABORATORY AND RADIOLOGIC DATA  Most recent CBC    Latest Ref Rng & Units 05/13/2023    3:57 PM 11/12/2022    4:37 PM 09/23/2022   11:55 AM  CBC  WBC 4.0 - 10.5 K/uL 8.7  10.4  9.0   Hemoglobin 12.0 - 15.0 g/dL 40.9  81.1  91.4   Hematocrit 36.0 - 46.0 % 42.3  42.0  41.1   Platelets 150.0 - 400.0 K/uL 374.0  334.0  311      Most recent CMP    Latest Ref Rng & Units 05/13/2023    3:57 PM 11/12/2022    4:37 PM 09/23/2022   11:55 AM  CMP  Glucose 70 - 99 mg/dL 782  956  213   BUN 6 - 23 mg/dL 22  25  30    Creatinine 0.40 - 1.20 mg/dL 0.86  5.78  4.69   Sodium 135 - 145 mEq/L 140  142  142   Potassium 3.5 - 5.1 mEq/L 4.6  4.1  5.1   Chloride 96 - 112 mEq/L 101  101  102   CO2 19 - 32 mEq/L 29  27  26    Calcium 8.4 - 10.5 mg/dL 62.9  52.8  41.3   Total Protein 6.0 - 8.3 g/dL 7.8  7.7    Total Bilirubin 0.2 - 1.2 mg/dL 0.7  0.6    Alkaline Phos 39 - 117 U/L 60  59    AST 0 - 37 U/L 25  22    ALT 0 - 35 U/L 22  20      Renal function CrCl cannot be calculated (Patient's most recent lab result is older than the maximum 21 days allowed.).  Hgb A1c MFr Bld (%)  Date Value  05/13/2023 6.5    LDL Cholesterol (Calc)  Date Value Ref Range Status  08/07/2020 48 mg/dL (calc) Final    Comment:    Reference range: <100 . Desirable range <100 mg/dL for primary prevention;   <70  mg/dL for patients with CHD or diabetic patients  with > or = 2 CHD risk factors. Marland Kitchen LDL-C is now calculated using the Martin-Hopkins  calculation, which is a validated novel method providing  better accuracy than the Friedewald equation in the  estimation of LDL-C.  Horald Pollen et al. Lenox Ahr. 2440;102(72): 2061-2068  (http://education.[facility].com/faq/FAQ164)    Direct LDL  Date Value Ref Range Status  11/12/2022 58.0 mg/dL Final    Comment:    Optimal:  <100 mg/dLNear or Above Optimal:  100-129 mg/dLBorderline High:  130-159 mg/dLHigh:  160-189 mg/dLVery High:  >190 mg/dL      Right Carotid: Velocities in the right ICA are consistent with a 40-59%                 stenosis.   Left Carotid: Velocities in the left ICA are consistent with a 60-79%  stenosis.               Non-hemodynamically significant plaque <50% noted in the  CCA.   Vertebrals: Bilateral vertebral arteries demonstrate antegrade  flow.  Subclavians: Normal flow hemodynamics were seen in bilateral subclavian               arteries.   Rande Brunt. Lenell Antu, MD FACS [facility] Phone Number: [phone] 10/04/2023 9:08 PM   Total time spent on preparing this encounter including chart review, data review, collecting history, examining the patient, coordinating care for this {tnhtimebilling:26202}  Portions of this report may have been transcribed using voice recognition software.  Every effort has been made to ensure accuracy; however, inadvertent computerized transcription errors may still be present.

## 2023-10-04 NOTE — Patient Instructions (Signed)
Medication Instructions:  Your physician recommends that you continue on your current medications as directed. Please refer to the Current Medication list given to you today.  *If you need a refill on your cardiac medications before your next appointment, please call your pharmacy*   Lab Work: None ordered  If you have labs (blood work) drawn today and your tests are completely normal, you will receive your results only by: MyChart Message (if you have MyChart) OR A paper copy in the mail If you have any lab test that is abnormal or we need to change your treatment, we will call you to review the results.   Testing/Procedures: None ordered  You have been referred to VVS (Vascular Surgery)  they will call you with an appointment.    Follow-Up: At [facility], you and your health needs are our priority.  As part of our continuing mission to provide you with exceptional heart care, we have created designated Provider Care Teams.  These Care Teams include your primary Cardiologist (physician) and Advanced Practice Providers (APPs -  Physician Assistants and Nurse Practitioners) who all work together to provide you with the care you need, when you need it.  We recommend signing up for the patient portal called "MyChart".  Sign up information is provided on this After Visit Summary.  MyChart is used to connect with patients for Virtual Visits (Telemedicine).  Patients are able to view lab/test results, encounter notes, upcoming appointments, etc.  Non-urgent messages can be sent to your provider as well.   To learn more about what you can do with MyChart, go to [email or web address].    Your next appointment:   6 month(s)  Provider:   Elder Negus, MD     Other Instructions

## 2023-10-04 NOTE — Telephone Encounter (Signed)
Ordering provider: Jari Favre Associated diagnoses: R06.83--R40.0 WatchPAT PA obtained on 10/04/2023 by Latrelle Dodrill, CMA. Authorization: do not require Pre-Authorization by [employer] Patient notified of PIN (1234) on 10/04/2023 via Notification Method: phone.

## 2023-10-05 ENCOUNTER — Ambulatory Visit: Payer: Medicare Other | Admitting: Family Medicine

## 2023-10-05 ENCOUNTER — Encounter: Payer: Medicare Other | Admitting: Vascular Surgery

## 2023-10-06 ENCOUNTER — Encounter: Payer: Medicare Other | Admitting: Vascular Surgery

## 2023-10-08 ENCOUNTER — Ambulatory Visit (HOSPITAL_COMMUNITY): Payer: Medicare Other | Attending: Cardiology

## 2023-10-08 DIAGNOSIS — I340 Nonrheumatic mitral (valve) insufficiency: Secondary | ICD-10-CM

## 2023-10-08 DIAGNOSIS — I350 Nonrheumatic aortic (valve) stenosis: Secondary | ICD-10-CM | POA: Diagnosis not present

## 2023-10-08 LAB — ECHOCARDIOGRAM COMPLETE
AR max vel: 1.3 cm2
AV Area VTI: 1.44 cm2
AV Area mean vel: 1.28 cm2
AV Mean grad: 18 mm[Hg]
AV Peak grad: 25.2 mm[Hg]
Ao pk vel: 2.51 m/s
Area-P 1/2: 3.53 cm2
P 1/2 time: 440 ms
S' Lateral: 2.3 cm

## 2023-10-21 ENCOUNTER — Encounter: Payer: Self-pay | Admitting: Family Medicine

## 2023-10-21 ENCOUNTER — Other Ambulatory Visit: Payer: Self-pay | Admitting: Family Medicine

## 2023-10-21 DIAGNOSIS — Z9889 Other specified postprocedural states: Secondary | ICD-10-CM

## 2023-11-08 DIAGNOSIS — D0511 Intraductal carcinoma in situ of right breast: Secondary | ICD-10-CM | POA: Diagnosis not present

## 2023-11-08 DIAGNOSIS — L739 Follicular disorder, unspecified: Secondary | ICD-10-CM | POA: Diagnosis not present

## 2023-11-09 ENCOUNTER — Encounter: Payer: Self-pay | Admitting: Family Medicine

## 2023-11-09 ENCOUNTER — Ambulatory Visit (INDEPENDENT_AMBULATORY_CARE_PROVIDER_SITE_OTHER): Payer: Medicare Other | Admitting: Family Medicine

## 2023-11-09 ENCOUNTER — Other Ambulatory Visit: Payer: Self-pay | Admitting: Family Medicine

## 2023-11-09 VITALS — BP 132/74 | HR 75 | Temp 97.3°F | Ht 60.0 in | Wt 164.6 lb

## 2023-11-09 DIAGNOSIS — R21 Rash and other nonspecific skin eruption: Secondary | ICD-10-CM | POA: Diagnosis not present

## 2023-11-09 MED ORDER — KETOCONAZOLE-HYDROCORTISONE 2-2.5 % EX CREA: TOPICAL_CREAM | CUTANEOUS | 0 refills | Status: AC

## 2023-11-09 NOTE — Progress Notes (Signed)
   Felicia Perry is a 78 y.o. female who presents today for an office visit.  Assessment/Plan:  Rash  Reassured patient that her presentation is not consistent with parasite infestation.  She does have dermatitis likely due to her recent use of several over-the-counter products.  May have a small amount of seborrheic dermatitis given anatomic location as well.  She was started on Bactrim by her oncologist.  Will start ketoconazole  hydrocortisone  cream twice daily for the next 1 to 2 weeks.  She will let me or her PCP know if not improving.    Subjective:  HPI:  Patient here with facial rash. This started a couple of months ago. Thinks that she was bit by a bug on the right side of her face. She was sitting outside at night when she was bitten. The next morning she found a couple of worms underneath her pillow. Since then she has had persistent pain and irritation on the right side of her face. Gets some redness on the right.  Symptoms come and go.  Worse at night.  Worse in the morning.  She has tried several over-the-counter products with have not helped.  She did see her oncologist yesterday and mentioned this to them.  They were concerned about folliculitis and started her on Bactrim.  She has not yet started this.        Objective:  Physical Exam: BP (!) 161/80   Pulse 75   Temp (!) 97.3 F (36.3 C) (Temporal)   Ht 5' (1.524 m)   Wt 164 lb 9.6 oz (74.7 kg)   SpO2 97%   BMI 32.15 kg/m   Gen: No acute distress, resting comfortably Skin: Faint erythema and flaking noted to right forehead.  Few scattered abrasions noted. Neuro: Grossly normal, moves all extremities Psych: Normal affect and thought content      Yotam Rhine M. Kennyth, MD 11/09/2023 2:23 PM

## 2023-11-09 NOTE — Patient Instructions (Signed)
 It was very nice to see you today!  Please start cream.  Let us  know if not improving in the next 1 to 2 weeks.  No follow-ups on file.   Take care, Dr Kennyth  PLEASE NOTE:  If you had any lab tests, please let us  know if you have not heard back within a few days. You may see your results on mychart before we have a chance to review them but we will give you a call once they are reviewed by us .   If we ordered any referrals today, please let us  know if you have not heard from their office within the next week.   If you had any urgent prescriptions sent in today, please check with the pharmacy within an hour of our visit to make sure the prescription was transmitted appropriately.   Please try these tips to maintain a healthy lifestyle:  Eat at least 3 REAL meals and 1-2 snacks per day.  Aim for no more than 5 hours between eating.  If you eat breakfast, please do so within one hour of getting up.   Each meal should contain half fruits/vegetables, one quarter protein, and one quarter carbs (no bigger than a computer mouse)  Cut down on sweet beverages. This includes juice, soda, and sweet tea.   Drink at least 1 glass of water with each meal and aim for at least 8 glasses per day  Exercise at least 150 minutes every week.

## 2023-11-10 ENCOUNTER — Ambulatory Visit: Payer: Self-pay | Admitting: Family Medicine

## 2023-11-10 NOTE — Telephone Encounter (Signed)
**Note De-identified  [place] Obfuscation** Please advise 

## 2023-11-10 NOTE — Telephone Encounter (Signed)
 See below, pt saw Daneil Dunker yesterday and this med was sent in by him.

## 2023-11-10 NOTE — Telephone Encounter (Signed)
 Copied from CRM [id number]. Topic: Clinical - Prescription Issue >> Nov 10, 2023 12:27 PM Jennings Mohr D wrote: Reason for CRM: Felicia Perry From [facility] received medicationKetoconazole-Hydrocortisone  2-2.5 % CREA  script for patient .Caller wanted to know if the medication was something the provider would like the pharmacy to compound . Pharmacy tech stated they dont carry the medications mixed together but they can if preferred . Pharmacy would like a call back for clarification.  [facility], [place], [place] - [address] [place], [place] [address] Phone: [phone]  Fax: [phone]

## 2023-11-10 NOTE — Telephone Encounter (Signed)
 I am ok with either compounding or splitting- whichever is cheaper for patient and covered by the last prescription

## 2023-11-10 NOTE — Telephone Encounter (Signed)
 Call pharmacy with VO  Rx will be ready for patient today

## 2023-11-11 NOTE — Progress Notes (Signed)
[facility] [address] [place] [address] Phone: [phone] Subjective:   Felicia Perry, am serving as a scribe for Dr. Antoine Primas.  I'm seeing this patient by the request  of:  Shelva Majestic, MD  CC: Neck pain, headache and low back pain.  [id number]  Felicia Perry is a 78 y.o. female coming in with complaint of back and neck pain. OMT 09/14/2023. Patient states that she is doing better. Only cleaning one house.  Feels that the decrease in the amount of work has been beneficial for her.  Patient states it is not having as much severe pain otherwise.  Feels medications is doing very well.  Patient is finding more times for herself.  No longer having family live with her as well which was another big stressor at the moment.  Medications patient has been prescribed: Effexor  Taking:         Reviewed prior external information including notes and imaging from previsou exam, outside providers and external EMR if available.   As well as notes that were available from care everywhere and other healthcare systems.  Past medical history, social, surgical and family history all reviewed in electronic medical record.  No pertanent information unless stated regarding to the chief complaint.   Past Medical History:  Diagnosis Date   Adenomatous polyp 11/23/2005   Anxiety    01/09/22- patient denies anxiety   Asthma 2003   Basal cell carcinoma 2021   right cheek per patient at [facility]   Carotid stenosis    Chronic diastolic CHF (congestive heart failure) (HCC) 2017   Echo 2021 was normal   COPD (chronic obstructive pulmonary disease) (HCC)    Coronary artery disease 2017   a. HC on 08/14/16 showed RCA CTO s/p PCI, 60% LCX disease managed medically with recs to consider staged PCI if continued symptoms.    Depression 2008   pt reports MD diagnosed but she does not feel depressed   Diabetes mellitus without complication  (HCC)    DIVERTICULOSIS, COLON 04/22/2007        GERD (gastroesophageal reflux disease) 2012   History of radiation therapy    Right breast- 03/04/22-04/02/22- Dr. Antony Blackbird   History of shingles    Hypertension 2008   Hypertensive heart disease    Insomnia 2003   Internal hemorrhoids    Osteoarthritis 2008   "knees, hands, lower back" (08/14/2016)   Pap smear for cervical cancer screening 2002   results normal   Personal history of radiation therapy    PUD (peptic ulcer disease) 1989   Seasonal allergies 2013   Sleep apnea    a. resolved with weight loss    Allergies  Allergen Reactions   Penicillins Anaphylaxis    REACTION: per patient causes rash,hives   Cephalosporins Swelling    REACTION: tongue swelling   Citalopram Hydrobromide Other (See Comments)    psychosis   Rosuvastatin Other (See Comments)    Pt reports causes bilateral lower extremity muscle aches.    Zolpidem Tartrate     REACTION: difficulty with concentration   Clarithromycin Rash    REACTION: blisters in mouth   Levofloxacin Rash    REACTION: tongue swells     Review of Systems:  No headache, visual changes, nausea, vomiting, diarrhea, constipation, dizziness, abdominal pain, skin rash, fevers, chills, night sweats, weight loss, swollen lymph nodes, body aches, joint swelling, chest pain, shortness of breath, mood changes. POSITIVE muscle  aches  Objective  Blood pressure 112/72, pulse 65, height 5' (1.524 m), weight 166 lb (75.3 kg), SpO2 97%.   General: No apparent distress alert and oriented x3 mood and affect normal, dressed appropriately.  HEENT: Pupils equal, extraocular movements intact  Respiratory: Patient's speak in full sentences and does not appear short of breath  Cardiovascular: No lower extremity edema, non tender, no erythema  Low back does have a loss lordosis noted.  Tightness noted in the paraspinal musculature.  Patient's neck does have some limited sidebending  bilaterally.  Osteopathic findings  C2 flexed rotated and side bent right C5 flexed rotated and side bent left T3 extended rotated and side bent right inhaled rib L2 flexed rotated and side bent right Sacrum right on right       Assessment and Plan:  SI (sacroiliac) joint dysfunction Chronic problem with patient is doing the exercises more in her bed and not doing the exacerbations that she was having previously.  Hopeful that this will continue to make improvement.  Discussed icing regimen and home exercises.  Discussed which activities to do and which ones to avoid.  Increase activity slowly over the course of next several weeks.  Follow-up with me again in 6 to 8 weeks otherwise.    Nonallopathic problems  Decision today to treat with OMT was based on Physical Exam  After verbal consent patient was treated with HVLA, ME, FPR techniques in cervical, rib, thoracic, lumbar, and sacral  areas  Patient tolerated the procedure well with improvement in symptoms  Patient given exercises, stretches and lifestyle modifications  See medications in patient instructions if given  Patient will follow up in 4-8 weeks     The above documentation has been reviewed and is accurate and complete Judi Saa, DO         Note: This dictation was prepared with Dragon dictation along with smaller phrase technology. Any transcriptional errors that result from this process are unintentional.

## 2023-11-16 ENCOUNTER — Ambulatory Visit: Payer: Medicare Other | Admitting: Family Medicine

## 2023-11-16 ENCOUNTER — Encounter: Payer: Self-pay | Admitting: Family Medicine

## 2023-11-16 VITALS — BP 112/72 | HR 65 | Ht 60.0 in | Wt 166.0 lb

## 2023-11-16 DIAGNOSIS — M9902 Segmental and somatic dysfunction of thoracic region: Secondary | ICD-10-CM

## 2023-11-16 DIAGNOSIS — M9908 Segmental and somatic dysfunction of rib cage: Secondary | ICD-10-CM

## 2023-11-16 DIAGNOSIS — M533 Sacrococcygeal disorders, not elsewhere classified: Secondary | ICD-10-CM

## 2023-11-16 DIAGNOSIS — M9904 Segmental and somatic dysfunction of sacral region: Secondary | ICD-10-CM | POA: Diagnosis not present

## 2023-11-16 DIAGNOSIS — M9901 Segmental and somatic dysfunction of cervical region: Secondary | ICD-10-CM

## 2023-11-16 DIAGNOSIS — M9903 Segmental and somatic dysfunction of lumbar region: Secondary | ICD-10-CM | POA: Diagnosis not present

## 2023-11-16 NOTE — Assessment & Plan Note (Signed)
Chronic problem with patient is doing the exercises more in her bed and not doing the exacerbations that she was having previously.  Hopeful that this will continue to make improvement.  Discussed icing regimen and home exercises.  Discussed which activities to do and which ones to avoid.  Increase activity slowly over the course of next several weeks.  Follow-up with me again in 6 to 8 weeks otherwise.

## 2023-11-16 NOTE — Patient Instructions (Signed)
So so happy where you are Be proud of yourself See you again in 3 months

## 2023-11-22 ENCOUNTER — Ambulatory Visit
Admission: RE | Admit: 2023-11-22 | Discharge: 2023-11-22 | Disposition: A | Discharge status: Home / Self Care | Payer: Medicare Other | Source: Ambulatory Visit | Attending: Family Medicine | Admitting: Family Medicine

## 2023-11-22 DIAGNOSIS — Z9889 Other specified postprocedural states: Secondary | ICD-10-CM

## 2023-11-22 DIAGNOSIS — Z853 Personal history of malignant neoplasm of breast: Secondary | ICD-10-CM | POA: Diagnosis not present

## 2023-11-22 DIAGNOSIS — R92323 Mammographic fibroglandular density, bilateral breasts: Secondary | ICD-10-CM | POA: Diagnosis not present

## 2023-11-22 NOTE — Progress Notes (Unsigned)
[facility]  ASSESSMENT / PLAN: Felicia Perry is a 78 y.o. female with asymptomatic bilateral carotid artery stenosis (R 40-59%; L 60-79%).   Recommend:  Abstinence from all tobacco products. Blood glucose control with goal A1c < 7%. Blood pressure control with goal blood pressure < 140/90 mmHg. Lipid reduction therapy with goal LDL-C <100 mg/dL  Aspirin 81mg  PO QD.  Atorvastatin 40-80mg  PO QD (or other "high intensity" statin therapy).  Follow-up in 1 year with repeat carotid duplex  CHIEF COMPLAINT: Carotid artery stenosis on duplex  HISTORY OF PRESENT ILLNESS: Felicia Perry is a 78 y.o. female referred to clinic for evaluation of bilateral carotid artery stenosis.  Patient is asymptomatic from a carotid artery standpoint.  She specifically denies symptoms of amaurosis fugax, facial droop, unilateral weakness or numbness, difficulty speaking.  Patient reports no strokes or mini strokes that she is aware of, and none in the last 6 months.  We reviewed her noninvasive testing in detail.  I reviewed the natural history of carotid artery stenosis.  I explained the rationale for surveillance.  Past Medical History:  Diagnosis Date   Adenomatous polyp 11/23/2005   Anxiety    01/09/22- patient denies anxiety   Asthma 2003   Basal cell carcinoma 2021   right cheek per patient at [facility] [facility]   Carotid stenosis    Chronic diastolic CHF (congestive heart failure) (HCC) 2017   Echo 2021 was normal   COPD (chronic obstructive pulmonary disease) (HCC)    Coronary artery disease 2017   a. HC on 08/14/16 showed RCA CTO s/p PCI, 60% LCX disease managed medically with recs to consider staged PCI if continued symptoms.    Depression 2008   pt reports MD diagnosed but she does not feel depressed   Diabetes mellitus without complication (HCC)    DIVERTICULOSIS, COLON 04/22/2007        GERD (gastroesophageal reflux disease) 2012   History of radiation  therapy    Right breast- 03/04/22-04/02/22- Dr. Antony Blackbird   History of shingles    Hypertension 2008   Hypertensive heart disease    Insomnia 2003   Internal hemorrhoids    Osteoarthritis 2008   "knees, hands, lower back" (08/14/2016)   Pap smear for cervical cancer screening 2002   results normal   Personal history of radiation therapy    PUD (peptic ulcer disease) 1989   Seasonal allergies 2013   Sleep apnea    a. resolved with weight loss    Past Surgical History:  Procedure Laterality Date   BREAST BIOPSY Right 12/30/2021   BREAST EXCISIONAL BIOPSY Left    1980's x 3 - all benign   BREAST EXCISIONAL BIOPSY Right    1980's x 1 or 2 - all benign   BREAST LUMPECTOMY Right 01/22/2022   BREAST LUMPECTOMY WITH RADIOACTIVE SEED LOCALIZATION Right 01/22/2022   Procedure: RIGHT BREAST LUMPECTOMY WITH RADIOACTIVE SEED LOCALIZATION;  Surgeon: Harriette Bouillon, MD;  Location: [facility];  Service: General;  Laterality: Right;   BREAST SURGERY Left 1980s   Fibrous Tumors removed    CARDIAC CATHETERIZATION N/A 08/14/2016   Procedure: Left Heart Cath and Coronary Angiography;  Surgeon: Tonny Bollman, MD;  Location: [facility] INVASIVE CV LAB;  Service: Cardiovascular;  Laterality: N/A;   CARDIAC CATHETERIZATION N/A 08/14/2016   Procedure: Coronary Stent Intervention;  Surgeon: Tonny Bollman, MD;  Location: [facility] INVASIVE CV LAB;  Service: Cardiovascular;  Laterality: N/A;   CATARACT EXTRACTION W/ INTRAOCULAR  LENS  IMPLANT, BILATERAL Bilateral 05/2009   COLONOSCOPY N/A 2016   Cologuard 2019   CORONARY ANGIOPLASTY     detached retina left eye     may 2019   LEFT HEART CATH AND CORONARY ANGIOGRAPHY N/A 09/25/2022   Procedure: LEFT HEART CATH AND CORONARY ANGIOGRAPHY;  Surgeon: Kathleene Hazel, MD;  Location: [facility] INVASIVE CV LAB;  Service: Cardiovascular;  Laterality: N/A;   TUBAL LIGATION  1970s    Family History  Problem Relation Age of Onset   Heart disease Mother     Hypertension Mother    Diabetes Mother    Dementia Mother    COPD Father    Dementia Maternal Grandmother    Colon cancer Neg Hx     Social History   Socioeconomic History   Marital status: Divorced    Spouse name: Not on file   Number of children: Not on file   Years of education: Not on file   Highest education level: Associate degree: occupational, Scientist, product/process development, or vocational program  Occupational History   Occupation: Cleaning  Tobacco Use   Smoking status: Former    Current packs/day: 0.00    Average packs/day: 1 pack/day for 26.0 years (26.0 ttl pk-yrs)    Types: Cigarettes    Start date: 1963    Quit date: 1989    Years since quitting: 36.0   Smokeless tobacco: Never  Vaping Use   Vaping status: Never Used  Substance and Sexual Activity   Alcohol use: No   Drug use: No   Sexual activity: Not Currently  Other Topics Concern   Not on file  Social History Narrative   Divorced for 38 years in 2016. 2 sons- 1 son had been in prison now living with her in 2019.  2 granddaughters with 1 living close.        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 Best friend Fannie Knee patient of Dr. Durene Cal. Enjoys work, time on Animator.    Social Drivers of Health   Financial Resource Strain: Medium Risk (11/08/2023)   Overall Financial Resource Strain (CARDIA)    Difficulty of Paying Living Expenses: Somewhat hard  Food Insecurity: No Food Insecurity (11/08/2023)   Hunger Vital Sign    Worried About Running Out of Food in the Last Year: Never true    Ran Out of Food in the Last Year: Never true  Transportation Needs: No Transportation Needs (11/08/2023)   PRAPARE - Administrator, Civil Service (Medical): No    Lack of Transportation (Non-Medical): No  Physical Activity: Insufficiently Active (11/08/2023)   Exercise Vital Sign     Days of Exercise per Week: 2 days    Minutes of Exercise per Session: 20 min  Stress: Stress Concern Present (11/08/2023)   [name] of Occupational Health - Occupational Stress Questionnaire    Feeling of Stress : To some extent  Social Connections: Moderately Integrated (11/08/2023)   Social Connection and Isolation Panel [NHANES]    Frequency of Communication with Friends and Family: Three times a week    Frequency of Social Gatherings with Friends and Family: Twice a week    Attends Religious Services: More than 4 times per year    Active Member of [employer] or Organizations: Yes    Attends Banker Meetings: More than  4 times per year    Marital Status: Divorced  Intimate Partner Violence: Not At Risk (04/05/2023)   Humiliation, Afraid, Rape, and Kick questionnaire    Fear of Current or Ex-Partner: No    Emotionally Abused: No    Physically Abused: No    Sexually Abused: No    Allergies  Allergen Reactions   Penicillins Anaphylaxis    REACTION: per patient causes rash,hives   Cephalosporins Swelling    REACTION: tongue swelling   Citalopram Hydrobromide Other (See Comments)    psychosis   Rosuvastatin Other (See Comments)    Pt reports causes bilateral lower extremity muscle aches.    Zolpidem Tartrate     REACTION: difficulty with concentration   Clarithromycin Rash    REACTION: blisters in mouth   Levofloxacin Rash    REACTION: tongue swells    Current Outpatient Medications  Medication Sig Dispense Refill   acetaminophen (TYLENOL) 325 MG tablet Take 650 mg by mouth every 6 (six) hours as needed for mild pain, moderate pain or headache.     albuterol (VENTOLIN HFA) 108 (90 Base) MCG/ACT inhaler Inhale 2 puffs into the lungs every 6 (six) hours as needed for wheezing or shortness of breath. 18 g 5   ALPRAZolam (XANAX) 1 MG tablet TAKE ONE TABLET BY MOUTH AT BEDTIME AS NEEDED FOR SLEEP -do not drive FOR EIGHT hours AFTER taking 90 tablet 1   amLODipine  (NORVASC) 10 MG tablet Take 1 tablet (10 mg total) by mouth daily. 90 tablet 2   anastrozole (ARIMIDEX) 1 MG tablet TAKE ONE TABLET DAILY 30 tablet 3   aspirin EC 81 MG tablet Take 1 tablet (81 mg total) by mouth daily. Swallow whole. 90 tablet 3   Bempedoic Acid-Ezetimibe (NEXLIZET) 180-10 MG TABS Take 180 mg by mouth daily. 90 tablet 3   budesonide-formoterol (SYMBICORT) 160-4.5 MCG/ACT inhaler Inhale 2 puffs into the lungs 2 (two) times daily. 1 each 11   carvedilol (COREG) 25 MG tablet Take 1 tablet (25 mg total) by mouth 2 (two) times daily with a meal. 180 tablet 1   clindamycin (CLEOCIN) 300 MG capsule Take 300 mg by mouth 3 (three) times daily.     famotidine (PEPCID) 20 MG tablet TAKE ONE TABLET TWICE DAILY 60 tablet 5   fluconazole (DIFLUCAN) 150 MG tablet Take 1 pill today and the 2nd pill in 3 days. 2 tablet 0   irbesartan (AVAPRO) 300 MG tablet Take 1 tablet (300 mg total) by mouth daily. 90 tablet 3   Ketoconazole-Hydrocortisone 2-2.5 % CREA Apply twice daily. 30 g 0   levocetirizine (XYZAL) 5 MG tablet TAKE ONE TABLET BY MOUTH EVERY MORNING 90 tablet 3   omega-3 acid ethyl esters (LOVAZA) 1 g capsule TAKE TWO CAPSULES EVERY DAY 180 capsule 3   ondansetron (ZOFRAN) 4 MG tablet Take 1 tablet (4 mg total) by mouth every 8 (eight) hours as needed for nausea or vomiting. Take prior to antibiotic. 20 tablet 0   predniSONE (DELTASONE) 20 MG tablet Take 1 tablet (20 mg total) by mouth daily with breakfast. 7 tablet 0   spironolactone (ALDACTONE) 25 MG tablet Take 0.5 tablets (12.5 mg total) by mouth daily. 45 tablet 1   venlafaxine XR (EFFEXOR-XR) 75 MG 24 hr capsule TAKE ONE CAPSULE DAILY WITH BREAKFAST 90 capsule 2   No current facility-administered medications for this visit.    PHYSICAL EXAM Vitals:   11/23/23 1255  BP: (!) 187/83  Pulse: 86  Resp: 20  Temp: 98.5 F (36.9 C)  SpO2: 95%  Weight: 168 lb (76.2 kg)  Height: 5' (1.524 m)    Elderly woman in no  distress Regular rate and rhythm Unlabored breathing Palpable radial pulses No focal neurodeficits Normal gait and station  PERTINENT LABORATORY AND RADIOLOGIC DATA  Most recent CBC    Latest Ref Rng & Units 05/13/2023    3:57 PM 11/12/2022    4:37 PM 09/23/2022   11:55 AM  CBC  WBC 4.0 - 10.5 K/uL 8.7  10.4  9.0   Hemoglobin 12.0 - 15.0 g/dL 63.8  75.6  43.3   Hematocrit 36.0 - 46.0 % 42.3  42.0  41.1   Platelets 150.0 - 400.0 K/uL 374.0  334.0  311      Most recent CMP    Latest Ref Rng & Units 05/13/2023    3:57 PM 11/12/2022    4:37 PM 09/23/2022   11:55 AM  CMP  Glucose 70 - 99 mg/dL 295  188  416   BUN 6 - 23 mg/dL 22  25  30    Creatinine 0.40 - 1.20 mg/dL 6.06  3.01  6.01   Sodium 135 - 145 mEq/L 140  142  142   Potassium 3.5 - 5.1 mEq/L 4.6  4.1  5.1   Chloride 96 - 112 mEq/L 101  101  102   CO2 19 - 32 mEq/L 29  27  26    Calcium 8.4 - 10.5 mg/dL 09.3  23.5  57.3   Total Protein 6.0 - 8.3 g/dL 7.8  7.7    Total Bilirubin 0.2 - 1.2 mg/dL 0.7  0.6    Alkaline Phos 39 - 117 U/L 60  59    AST 0 - 37 U/L 25  22    ALT 0 - 35 U/L 22  20      Renal function CrCl cannot be calculated (Patient's most recent lab result is older than the maximum 21 days allowed.).  Hgb A1c MFr Bld (%)  Date Value  05/13/2023 6.5    LDL Cholesterol (Calc)  Date Value Ref Range Status  08/07/2020 48 mg/dL (calc) Final    Comment:    Reference range: <100 . Desirable range <100 mg/dL for primary prevention;   <70 mg/dL for patients with CHD or diabetic patients  with > or = 2 CHD risk factors. Marland Kitchen LDL-C is now calculated using the Martin-Hopkins  calculation, which is a validated novel method providing  better accuracy than the Friedewald equation in the  estimation of LDL-C.  Horald Pollen et al. Lenox Ahr. 2202;542(70): 2061-2068  (http://education.[facility].com/faq/FAQ164)    Direct LDL  Date Value Ref Range Status  11/12/2022 58.0 mg/dL Final    Comment:    Optimal:   <100 mg/dLNear or Above Optimal:  100-129 mg/dLBorderline High:  130-159 mg/dLHigh:  160-189 mg/dLVery High:  >190 mg/dL    Carotid Duplex 03/27/36:  Right Carotid: Velocities in the right ICA are consistent with a 40-59%                 stenosis.   Left Carotid: Velocities in the left ICA are consistent with a 60-79%  stenosis.               Non-hemodynamically significant plaque <50% noted in the  CCA.   Vertebrals: Bilateral vertebral arteries demonstrate antegrade flow.  Subclavians: Normal flow hemodynamics were seen in bilateral subclavian  arteries.   Rande Brunt. Lenell Antu, MD FACS [facility] Phone Number: [phone] 11/22/2023 10:03 AM   Total time spent on preparing this encounter including chart review, data review, collecting history, examining the patient, coordinating care for this new patient, 60 minutes.  Portions of this report may have been transcribed using voice recognition software.  Every effort has been made to ensure accuracy; however, inadvertent computerized transcription errors may still be present.

## 2023-11-23 ENCOUNTER — Encounter: Payer: Self-pay | Admitting: Vascular Surgery

## 2023-11-23 ENCOUNTER — Encounter: Payer: Self-pay | Admitting: Family Medicine

## 2023-11-23 ENCOUNTER — Ambulatory Visit: Payer: Medicare Other | Admitting: Vascular Surgery

## 2023-11-23 ENCOUNTER — Other Ambulatory Visit: Payer: Self-pay | Admitting: Family Medicine

## 2023-11-23 ENCOUNTER — Ambulatory Visit (INDEPENDENT_AMBULATORY_CARE_PROVIDER_SITE_OTHER): Payer: Medicare Other | Admitting: Family Medicine

## 2023-11-23 VITALS — BP 187/83 | HR 86 | Temp 98.5°F | Resp 20 | Ht 60.0 in | Wt 168.0 lb

## 2023-11-23 VITALS — BP 110/72 | HR 87 | Temp 98.1°F | Ht 60.0 in | Wt 168.0 lb

## 2023-11-23 DIAGNOSIS — I6523 Occlusion and stenosis of bilateral carotid arteries: Secondary | ICD-10-CM | POA: Diagnosis not present

## 2023-11-23 DIAGNOSIS — Z0000 Encounter for general adult medical examination without abnormal findings: Secondary | ICD-10-CM | POA: Diagnosis not present

## 2023-11-23 DIAGNOSIS — E118 Type 2 diabetes mellitus with unspecified complications: Secondary | ICD-10-CM | POA: Diagnosis not present

## 2023-11-23 DIAGNOSIS — I5032 Chronic diastolic (congestive) heart failure: Secondary | ICD-10-CM | POA: Diagnosis not present

## 2023-11-23 DIAGNOSIS — I10 Essential (primary) hypertension: Secondary | ICD-10-CM

## 2023-11-23 DIAGNOSIS — E559 Vitamin D deficiency, unspecified: Secondary | ICD-10-CM | POA: Diagnosis not present

## 2023-11-23 DIAGNOSIS — E785 Hyperlipidemia, unspecified: Secondary | ICD-10-CM

## 2023-11-23 DIAGNOSIS — E1169 Type 2 diabetes mellitus with other specified complication: Secondary | ICD-10-CM | POA: Diagnosis not present

## 2023-11-23 DIAGNOSIS — Z23 Encounter for immunization: Secondary | ICD-10-CM

## 2023-11-23 DIAGNOSIS — G720 Drug-induced myopathy: Secondary | ICD-10-CM

## 2023-11-23 LAB — LIPID PANEL
Cholesterol: 126 mg/dL (ref 0–200)
HDL: 30.3 mg/dL — ABNORMAL LOW (ref >39.00)
NonHDL: 95.92
Total CHOL/HDL Ratio: 4
Triglycerides: 495 mg/dL — ABNORMAL HIGH (ref 0.0–149.0)
VLDL: 99 mg/dL — ABNORMAL HIGH (ref 0.0–40.0)

## 2023-11-23 LAB — CBC WITH DIFFERENTIAL/PLATELET
Basophils Absolute: 0.2 10*3/uL — ABNORMAL HIGH (ref 0.0–0.1)
Basophils Relative: 2.1 % (ref 0.0–3.0)
Eosinophils Absolute: 0.4 10*3/uL (ref 0.0–0.7)
Eosinophils Relative: 4.1 % (ref 0.0–5.0)
HCT: 41.9 % (ref 36.0–46.0)
Hemoglobin: 14.3 g/dL (ref 12.0–15.0)
Lymphocytes Relative: 17.2 % (ref 12.0–46.0)
Lymphs Abs: 1.5 10*3/uL (ref 0.7–4.0)
MCHC: 34 g/dL (ref 30.0–36.0)
MCV: 90.2 fL (ref 78.0–100.0)
Monocytes Absolute: 0.9 10*3/uL (ref 0.1–1.0)
Monocytes Relative: 9.9 % (ref 3.0–12.0)
Neutro Abs: 5.8 10*3/uL (ref 1.4–7.7)
Neutrophils Relative %: 66.7 % (ref 43.0–77.0)
Platelets: 324 10*3/uL (ref 150.0–400.0)
RBC: 4.64 Mil/uL (ref 3.87–5.11)
RDW: 12.4 % (ref 11.5–15.5)
WBC: 8.6 10*3/uL (ref 4.0–10.5)

## 2023-11-23 LAB — HEMOGLOBIN A1C: Hgb A1c MFr Bld: 8.1 % — ABNORMAL HIGH (ref 4.6–6.5)

## 2023-11-23 LAB — COMPREHENSIVE METABOLIC PANEL
ALT: 32 U/L (ref 0–35)
AST: 28 U/L (ref 0–37)
Albumin: 4.5 g/dL (ref 3.5–5.2)
Alkaline Phosphatase: 121 U/L — ABNORMAL HIGH (ref 39–117)
BUN: 20 mg/dL (ref 6–23)
CO2: 26 meq/L (ref 19–32)
Calcium: 9.4 mg/dL (ref 8.4–10.5)
Chloride: 102 meq/L (ref 96–112)
Creatinine, Ser: 0.8 mg/dL (ref 0.40–1.20)
GFR: 71.07 mL/min (ref >60.00)
Glucose, Bld: 213 mg/dL — ABNORMAL HIGH (ref 70–99)
Potassium: 4 meq/L (ref 3.5–5.1)
Sodium: 138 meq/L (ref 135–145)
Total Bilirubin: 0.4 mg/dL (ref 0.2–1.2)
Total Protein: 7.2 g/dL (ref 6.0–8.3)

## 2023-11-23 LAB — MICROALBUMIN / CREATININE URINE RATIO
Creatinine,U: 92.8 mg/dL
Microalb Creat Ratio: 1.8 mg/g (ref 0.0–30.0)
Microalb, Ur: 1.7 mg/dL (ref 0.0–1.9)

## 2023-11-23 LAB — VITAMIN D 25 HYDROXY (VIT D DEFICIENCY, FRACTURES): VITD: 25.26 ng/mL — ABNORMAL LOW (ref 30.00–100.00)

## 2023-11-23 LAB — LDL CHOLESTEROL, DIRECT: Direct LDL: 53 mg/dL

## 2023-11-23 MED ORDER — BUDESONIDE-FORMOTEROL FUMARATE 160-4.5 MCG/ACT IN AERO: 2.0000 | INHALATION_SPRAY | Freq: Two times a day (BID) | RESPIRATORY_TRACT | 11 refills | Status: DC

## 2023-11-23 MED ORDER — VITAMIN D (ERGOCALCIFEROL) 1.25 MG (50000 UNIT) PO CAPS: 50000.0000 [IU] | ORAL_CAPSULE | ORAL | 1 refills | Status: DC

## 2023-11-23 NOTE — Progress Notes (Addendum)
Phone: [phone]   Subjective:  Patient presents today for their annual physical. Chief complaint-noted.   See problem oriented charting- ROS- full  review of systems was completed and negative  Per full ROS sheet completed by patient- stress is better, also prior bite finally went back down with antibiotic that Dr. Luisa Hart ended up giving her- never used cream from Dr. Jimmey Ralph  The following were reviewed and entered/updated in epic: Past Medical History:  Diagnosis Date   Adenomatous polyp 11/23/2005   Anxiety    01/09/22- patient denies anxiety   Asthma 2003   Basal cell carcinoma 2021   right cheek per patient at [facility] [facility]   Carotid stenosis    Chronic diastolic CHF (congestive heart failure) (HCC) 2017   Echo 2021 was normal   COPD (chronic obstructive pulmonary disease) (HCC)    Coronary artery disease 2017   a. HC on 08/14/16 showed RCA CTO s/p PCI, 60% LCX disease managed medically with recs to consider staged PCI if continued symptoms.    Depression 2008   pt reports MD diagnosed but she does not feel depressed   Diabetes mellitus without complication (HCC)    DIVERTICULOSIS, COLON 04/22/2007        GERD (gastroesophageal reflux disease) 2012   History of radiation therapy    Right breast- 03/04/22-04/02/22- Dr. Antony Blackbird   History of shingles    Hypertension 2008   Hypertensive heart disease    Insomnia 2003   Internal hemorrhoids    Osteoarthritis 2008   "knees, hands, lower back" (08/14/2016)   Pap smear for cervical cancer screening 2002   results normal   Personal history of radiation therapy    PUD (peptic ulcer disease) 1989   Seasonal allergies 2013   Sleep apnea    a. resolved with weight loss   Patient Active Problem List   Diagnosis Date Noted   Ductal carcinoma in situ (DCIS) of right breast 01/05/2022    Priority: High   Diabetes mellitus type 2 with complications (HCC) 02/05/2021    Priority: High   Carotid stenosis 01/23/2020     Priority: High   Chronic diastolic CHF (congestive heart failure) (HCC) 01/12/2019    Priority: High   Coronary artery disease involving native coronary artery of native heart without angina pectoris     Priority: High   Syncope 08/04/2016    Priority: High   statin myalgia 06/25/2020    Priority: Medium    Hyperlipidemia associated with type 2 diabetes mellitus (HCC) 07/11/2018    Priority: Medium    H/O hyperparathyroidism 08/18/2016    Priority: Medium    Vitamin D deficiency 08/20/2015    Priority: Medium    Chronic pain syndrome 03/26/2015    Priority: Medium    Insomnia 07/13/2014    Priority: Medium    GERD (gastroesophageal reflux disease) 02/25/2011    Priority: Medium    Depression 04/22/2007    Priority: Medium    Essential hypertension 04/22/2007    Priority: Medium    Asthma 04/22/2007    Priority: Medium    Greater trochanteric bursitis of left hip 08/13/2020    Priority: Low   Senile purpura (HCC) 08/07/2020    Priority: Low   Degenerative arthritis of knee, bilateral 12/08/2018    Priority: Low   Partial hamstring tear, initial encounter 11/16/2018    Priority: Low   Nonallopathic lesion of sacral region 07/05/2018    Priority: Low   Nonallopathic lesion of cervical region 07/05/2018  Priority: Low   DDD (degenerative disc disease), cervical 05/04/2018    Priority: Low   Degenerative arthritis of left knee 09/15/2017    Priority: Low   PUD (peptic ulcer disease)     Priority: Low   Hyperglycemia 08/06/2015    Priority: Low   Xerosis of skin 08/06/2015    Priority: Low   Hypersomnia with sleep apnea 03/26/2015    Priority: Low   Nonallopathic lesion of lumbosacral region 11/13/2014    Priority: Low   Osteoarthritis of left lower extremity 10/17/2014    Priority: Low   Chronic meniscal tear of knee 10/17/2014    Priority: Low   Nonallopathic lesion of thoracic region 09/14/2014    Priority: Low   Nonallopathic lesion-rib cage 08/21/2014     Priority: Low   Tendinopathy of right rotator cuff 07/30/2014    Priority: Low   Piriformis syndrome of right side 07/30/2014    Priority: Low   Limited joint range of motion 07/13/2014    Priority: Low   Rash 07/13/2014    Priority: Low   SI (sacroiliac) joint dysfunction 10/10/2007    Priority: Low   LOW BACK PAIN 07/01/2007    Priority: Low   Shortness of breath 09/25/2022   Genetic testing 01/19/2022   Intractable headache 01/02/2021   Past Surgical History:  Procedure Laterality Date   BREAST BIOPSY Right 12/30/2021   BREAST EXCISIONAL BIOPSY Left    1980's x 3 - all benign   BREAST EXCISIONAL BIOPSY Right    1980's x 1 or 2 - all benign   BREAST LUMPECTOMY Right 01/22/2022   BREAST LUMPECTOMY WITH RADIOACTIVE SEED LOCALIZATION Right 01/22/2022   Procedure: RIGHT BREAST LUMPECTOMY WITH RADIOACTIVE SEED LOCALIZATION;  Surgeon: Harriette Bouillon, MD;  Location: [facility];  Service: General;  Laterality: Right;   BREAST SURGERY Left 1980s   Fibrous Tumors removed    CARDIAC CATHETERIZATION N/A 08/14/2016   Procedure: Left Heart Cath and Coronary Angiography;  Surgeon: Tonny Bollman, MD;  Location: [facility] INVASIVE CV LAB;  Service: Cardiovascular;  Laterality: N/A;   CARDIAC CATHETERIZATION N/A 08/14/2016   Procedure: Coronary Stent Intervention;  Surgeon: Tonny Bollman, MD;  Location[facility] INVASIVE CV LAB;  Service: Cardiovascular;  Laterality: N/A;   CATARACT EXTRACTION W/ INTRAOCULAR LENS  IMPLANT, BILATERAL Bilateral 05/2009   COLONOSCOPY N/A 2016   Cologuard 2019   CORONARY ANGIOPLASTY     detached retina left eye     may 2019   LEFT HEART CATH AND CORONARY ANGIOGRAPHY N/A 09/25/2022   Procedure: LEFT HEART CATH AND CORONARY ANGIOGRAPHY;  Surgeon: Kathleene Hazel, MD;  Location: [facility] INVASIVE CV LAB;  Service: Cardiovascular;  Laterality: N/A;   TUBAL LIGATION  1970s    Family History  Problem Relation Age of Onset   Heart disease Mother     Hypertension Mother    Diabetes Mother    Dementia Mother    COPD Father    Dementia Maternal Grandmother    Colon cancer Neg Hx     Medications- reviewed and updated Current Outpatient Medications  Medication Sig Dispense Refill   acetaminophen (TYLENOL) 325 MG tablet Take 650 mg by mouth every 6 (six) hours as needed for mild pain, moderate pain or headache.     albuterol (VENTOLIN HFA) 108 (90 Base) MCG/ACT inhaler Inhale 2 puffs into the lungs every 6 (six) hours as needed for wheezing or shortness of breath. 18 g 5   amLODipine (NORVASC) 10 MG tablet Take  1 tablet (10 mg total) by mouth daily. 90 tablet 2   anastrozole (ARIMIDEX) 1 MG tablet TAKE ONE TABLET DAILY 30 tablet 3   aspirin EC 81 MG tablet Take 1 tablet (81 mg total) by mouth daily. Swallow whole. 90 tablet 3   Bempedoic Acid-Ezetimibe (NEXLIZET) 180-10 MG TABS Take 180 mg by mouth daily. 90 tablet 3   carvedilol (COREG) 25 MG tablet Take 1 tablet (25 mg total) by mouth 2 (two) times daily with a meal. 180 tablet 1   famotidine (PEPCID) 20 MG tablet TAKE ONE TABLET TWICE DAILY 60 tablet 5   fluconazole (DIFLUCAN) 150 MG tablet Take 1 pill today and the 2nd pill in 3 days. 2 tablet 0   irbesartan (AVAPRO) 300 MG tablet Take 1 tablet (300 mg total) by mouth daily. 90 tablet 3   Ketoconazole-Hydrocortisone 2-2.5 % CREA Apply twice daily. 30 g 0   levocetirizine (XYZAL) 5 MG tablet TAKE ONE TABLET BY MOUTH EVERY MORNING 90 tablet 3   omega-3 acid ethyl esters (LOVAZA) 1 g capsule TAKE TWO CAPSULES EVERY DAY 180 capsule 3   spironolactone (ALDACTONE) 25 MG tablet Take 0.5 tablets (12.5 mg total) by mouth daily. 45 tablet 1   venlafaxine XR (EFFEXOR-XR) 75 MG 24 hr capsule TAKE ONE CAPSULE DAILY WITH BREAKFAST 90 capsule 2   ALPRAZolam (XANAX) 1 MG tablet TAKE ONE TABLET BY MOUTH AT BEDTIME AS NEEDED FOR SLEEP -do not drive FOR EIGHT hours AFTER taking (Patient not taking: Reported on 11/23/2023) 90 tablet 1    budesonide-formoterol (SYMBICORT) 160-4.5 MCG/ACT inhaler Inhale 2 puffs into the lungs 2 (two) times daily. 1 each 11   ondansetron (ZOFRAN) 4 MG tablet Take 1 tablet (4 mg total) by mouth every 8 (eight) hours as needed for nausea or vomiting. Take prior to antibiotic. (Patient not taking: Reported on 11/23/2023) 20 tablet 0   No current facility-administered medications for this visit.    Allergies-reviewed and updated Allergies  Allergen Reactions   Penicillins Anaphylaxis    REACTION: per patient causes rash,hives   Cephalosporins Swelling    REACTION: tongue swelling   Citalopram Hydrobromide Other (See Comments)    psychosis   Rosuvastatin Other (See Comments)    Pt reports causes bilateral lower extremity muscle aches.    Zolpidem Tartrate     REACTION: difficulty with concentration   Clarithromycin Rash    REACTION: blisters in mouth   Levofloxacin Rash    REACTION: tongue swells    Social History   Social History Narrative   Divorced for 38 years in 2016. 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    Objective  Objective:  BP 110/72 Comment: most recent home reading yesterday  Pulse 87   Temp 98.1 F (36.7 C)   Ht 5' (1.524 m)   Wt 168 lb (76.2 kg)   SpO2 95%   BMI 32.81 kg/m  Gen: NAD, resting comfortably HEENT: Mucous membranes are moist. Oropharynx normal Neck: no thyromegaly CV: RRR stable murmur but no rubs or gallops Lungs: CTAB no crackles, wheeze, rhonchi Abdomen: soft/nontender/nondistended/normal bowel sounds. No rebound or guarding.  Ext: no edema Skin: warm, dry Neuro: grossly normal, moves all extremities, PERRLA   Diabetic Foot  Exam - Simple   Simple Foot Form Diabetic Foot exam was performed with the following findings: Yes 11/23/2023  9:15 AM  Visual Inspection No deformities, no ulcerations, no other skin breakdown bilaterally: Yes Sensation Testing Intact to touch and monofilament testing bilaterally: Yes Pulse Check Posterior Tibialis and Dorsalis pulse intact bilaterally: Yes Comments        Assessment and Plan  78 y.o. female presenting for annual physical.  Health Maintenance counseling: 1. Anticipatory guidance: Patient counseled regarding regular dental exams -advised q6 months- costly but encouraged her to check with her advantage plan,  eye exams -yearly but needs new eye doctor,  avoiding smoking and second hand smoke , limiting alcohol to 2 beverages per day-still very minimal perhaps 1 bottle of wine total per year, no illicit drugs .   2. Risk factor reduction:  Advised patient of need for regular exercise and diet rich and fruits and vegetables to reduce risk of heart attack and stroke.  Exercise-  gets some activity with cleaning one house and walking dogs 1 mile a day most days Diet/weight management-weight up 5 pounds from last year but have been down 12 pounds-encouraged gradual weight loss- trying to watch her intake- saw weight ticking up with stopping cleaning as many houses.  Wt Readings from Last 3 Encounters:  11/23/23 168 lb (76.2 kg)  11/16/23 166 lb (75.3 kg)  11/09/23 164 lb 9.6 oz (74.7 kg)  3. Immunizations/screenings/ancillary studies-declines COVID-19 vaccination. flu shot today. recommended RSV at pharmacy  Immunization History  Administered Date(s) Administered   Fluad Quad(high Dose 65+) 07/25/2019, 08/07/2020, 08/12/2021, 11/12/2022   Fluad Trivalent(High Dose 65+) 11/23/2023   Influenza Split 09/21/2011, 07/07/2012   Influenza Whole 08/10/2008, 07/30/2010   Influenza, High Dose Seasonal PF 08/04/2016, 07/05/2018   Influenza,inj,Quad PF,6+ Mos 07/21/2013, 08/06/2015    Influenza-Unspecified 07/10/2014, 10/01/2017   PFIZER(Purple Top)SARS-COV-2 Vaccination 12/26/2019, 01/17/2020, 09/30/2020   Pneumococcal Conjugate-13 01/05/2014   Pneumococcal Polysaccharide-23 02/08/2015   Td 10/26/2001   Tetanus 01/05/2014  4. Cervical cancer screening- past age based screening recommendations-denies history of abnormal Pap smears. No blood or discharge from vagina.    5. Breast cancer screening- with oncology and Dr. Luisa Hart and mammogram 11/22/23 6. Colon cancer screening -Cologuard 01/15/2022 with no further repeat planned   7. Skin cancer screening-seen dermatology- but office closed-will refer to new dermatologist last year- they ended up canceling her scheduled appointment- she plans to call back .  Advised regular sunscreen use. Denies worrisome, changing, or new skin lesions.  8. Birth control/STD check- postmenopausal/dating still but not sexually active.  Would use condoms if active- no unprotected sex    59. Osteoporosis screening at 65-last done in 2023-very close to osteoporosis range-oncology has already discussed with her calcium, vitamin D, weightbearing exercises- encouraged at minimum vitamin D 1000 units- she has at home  10. Smoking associated screening - former smoker - 26 pack years quit in 1980s.  No regular screening required   Status of chronic or acute concerns   # Social-a lot of stress last visit after she lost a bit client and her son brought a woman into her home that she did not feel comfortable with- she was able to get both of them out of the home  - she decided to retire other than one home that she is still doing and house sitting and dog sitting  #Right breast DCIS  on biopsy 12/31/2021-she has had a lumpectomy and has completed adjuvant radiation on 04/02/2022.  She is taking anastrozole as well and plans for 79-month follow-up  -Follows with Dr. Al Pimple and Dr. Luisa Hart  # Diabetes-diagnosed 08/07/2020 after having second A1c 6.5 or above S:  Medication: None, currently diet controlled Lab Results  Component Value Date   HGBA1C 6.5 05/13/2023   HGBA1C 6.8 (H) 11/12/2022   HGBA1C 6.4 04/08/2022  A/P: hopefully stable- update a1c today. Continue without meds for now   #hypertension #Chronic diastolic CHF S: medication: Amlodipine 10 mg, irbesartan 300 mg, coreg 25 mg twice daily, spironolactone 25 mg. No lasix in sometime. No edema. Weight up some but thinks food choices Home readings #s: never over 130/70s BP Readings from Last 3 Encounters:  11/23/23 110/72  11/16/23 112/72  11/09/23 132/74  A/P: hypertension stable- continue current medicines  CHF stable/euvolemic- continue current medications    #CAD  #hyperlipidemia with statin myalgia #Carotid stenosis-checked September each year by cardiology- last done 06/30/23 with plan for yearly with worst stenosis on left 60-79% S: Medication: nexlizet (bempedoic acid-ezetimibe), Lovaza, aspirin 81 mg -History of syncope related to ischemia -Still struggles with elevated triglycerides on this regimen - ongoing shortness of breath but no change. No chest pain- actually does better with dancing Lab Results  Component Value Date   CHOL 120 11/12/2022   HDL 32.30 (L) 11/12/2022   LDLCALC 48 08/07/2020   LDLDIRECT 58.0 11/12/2022   TRIG 294.0 (H) 11/12/2022   CHOLHDL 4 11/12/2022   A/P: CAD largely asymptomatic as far as chest pain but stable shortness of breath which could be asthma or deconditioning- but stable- continue current medications  Lipids above goal for triglyceride(s) - update with labs today -will get baseline lipoprotein a  # Asthma/allergies S: Maintenance Medication: Symbicort 160-4.52 puffs in off.  For allergies-on Xyzal As needed medication:  albuterol not needing lately A/P: I wonder if her baseline shortness of breath may improve back on Symbicort- she has stopped for sometime   #Depression-medication also helps with chronic pain/insomnia S: Medication:  Effexor 75 mg extended release alprazolam 1 mg as needed before bed    11/23/2023    9:00 AM 11/09/2023    2:45 PM 05/13/2023    3:05 PM  Depression screen PHQ 2/9  Decreased Interest 0 1 3  Down, Depressed, Hopeless 0 0 0  PHQ - 2 Score 0 1 3  Altered sleeping 0 1 3  Tired, decreased energy 1 1 3   Change in appetite 1 1 2   Feeling bad or failure about yourself  0 0 0  Trouble concentrating 0 0 0  Moving slowly or fidgety/restless 0 0 0  Suicidal thoughts 0 0 0  PHQ-9 Score 2 4 11   Difficult doing work/chores Not difficult at all Somewhat difficult Somewhat difficult  A/P: depression in full remission- continue current medications     #GERD S: Medication: Pepcid 20 mg twice daily as needed- but eating better A/P: reasonable control- continue current medications      #Vitamin D deficiency S: Medication: stopped taking her vitamin D Last vitamin D Lab Results  Component Value Date   VD25OH 30.31 05/13/2023  A/P: recheck vitamin D next visit when back on vitamin D   #Chronic pain/multiple orthopedic issues-follows closely with Dr. Terrilee Files- thankfully joints better- could be from retiring  Recommended follow up: Return in about 6 months (around 05/22/2024) for followup or sooner if needed.Schedule b4 you leave. Future Appointments  Date  Time Provider Department Center  11/23/2023  1:00 PM Leonie Douglas, MD [facility]  01/04/2024  2:00 PM Rachel Moulds, MD [facility] None  02/15/2024  9:00 AM Judi Saa, DO [facility] None  04/10/2024  4:00 PM [facility] ANNUAL WELLNESS VISIT 1 [facility]  05/29/2024 10:00 AM Shelva Majestic, MD [facility]   Lab/Order associations: fasting   ICD-10-CM   1. Preventative health care  Z00.00     2. Chronic heart failure with preserved ejection fraction (HCC) Chronic I50.32     3. Hyperlipidemia associated with type 2 diabetes mellitus (HCC) Chronic E11.69 Comprehensive metabolic panel   N62.9 CBC with Differential/Platelet    Lipid  panel    Lipoprotein A (LPA)    4. Diabetes mellitus type 2 with complications (HCC)  E11.8 Hemoglobin A1c    Urine Microalbumin w/creat. ratio    Comprehensive metabolic panel    CBC with Differential/Platelet    Lipid panel    5. Essential hypertension  I10     6. Vitamin D deficiency  E55.9 VITAMIN D 25 Hydroxy (Vit-D Deficiency, Fractures)    7. Drug-induced myopathy  G72.0     8. Need for influenza vaccination  Z23 Flu Vaccine Trivalent High Dose (Fluad)      Meds ordered this encounter  Medications   budesonide-formoterol (SYMBICORT) 160-4.5 MCG/ACT inhaler    Sig: Inhale 2 puffs into the lungs 2 (two) times daily.    Dispense:  1 each    Refill:  11    Return precautions advised.  Tana Conch, MD

## 2023-11-23 NOTE — Addendum Note (Signed)
Addended by: Samara Deist on: 11/23/2023 09:39 AM   Modules accepted: Orders

## 2023-11-23 NOTE — Patient Instructions (Addendum)
Please let new eye doctor know that your primary care doctor thinks you have diabetes and you need a diabetic eye exam  Flu shot before you leave  RSV at pharmacy  Please stop by lab before you go If you have mychart- we will send your results within 3 business days of [place] receiving them.  If you do not have mychart- we will call you about results within 5 business days of [place] receiving them.  *please also note that you will see labs on mychart as soon as they post. I will later go in and write notes on them- will say "notes from Dr. Durene Cal"    Recommended follow up: Return in about 6 months (around 05/22/2024) for followup or sooner if needed.Schedule b4 you leave.

## 2023-11-24 ENCOUNTER — Other Ambulatory Visit: Payer: Self-pay

## 2023-11-24 ENCOUNTER — Telehealth: Payer: Self-pay

## 2023-11-24 MED ORDER — METFORMIN HCL 500 MG PO TABS: 500.0000 mg | ORAL_TABLET | Freq: Two times a day (BID) | ORAL | 3 refills | Status: DC

## 2023-11-24 NOTE — Telephone Encounter (Signed)
I have responded to pt via mychart.  Copied from CRM [phone]. Topic: General - Other >> Nov 24, 2023  2:26 PM Florestine Avers wrote: Reason for CRM: Patient called in requesting "Monica Martinez" call her back.

## 2023-11-27 LAB — LIPOPROTEIN A (LPA): Lipoprotein (a): <10 nmol/L (ref <75)

## 2023-11-28 ENCOUNTER — Encounter: Payer: Self-pay | Admitting: Family Medicine

## 2023-12-01 ENCOUNTER — Emergency Department (HOSPITAL_BASED_OUTPATIENT_CLINIC_OR_DEPARTMENT_OTHER): Payer: Medicare Other | Admitting: Radiology

## 2023-12-01 ENCOUNTER — Encounter: Payer: Self-pay | Admitting: Physician Assistant

## 2023-12-01 ENCOUNTER — Other Ambulatory Visit: Payer: Self-pay

## 2023-12-01 ENCOUNTER — Encounter (HOSPITAL_BASED_OUTPATIENT_CLINIC_OR_DEPARTMENT_OTHER): Payer: Self-pay

## 2023-12-01 ENCOUNTER — Ambulatory Visit: Payer: Medicare Other | Admitting: Physician Assistant

## 2023-12-01 ENCOUNTER — Emergency Department (HOSPITAL_BASED_OUTPATIENT_CLINIC_OR_DEPARTMENT_OTHER)
Admission: EM | Admit: 2023-12-01 | Discharge: 2023-12-01 | Disposition: A | Discharge status: Home / Self Care | Payer: Medicare Other | Attending: Emergency Medicine | Admitting: Emergency Medicine

## 2023-12-01 VITALS — BP 90/54 | HR 69 | Temp 97.3°F | Ht 60.0 in | Wt 160.5 lb

## 2023-12-01 DIAGNOSIS — J441 Chronic obstructive pulmonary disease with (acute) exacerbation: Secondary | ICD-10-CM | POA: Insufficient documentation

## 2023-12-01 DIAGNOSIS — R6889 Other general symptoms and signs: Secondary | ICD-10-CM

## 2023-12-01 DIAGNOSIS — I700 Atherosclerosis of aorta: Secondary | ICD-10-CM | POA: Diagnosis not present

## 2023-12-01 DIAGNOSIS — I959 Hypotension, unspecified: Secondary | ICD-10-CM | POA: Diagnosis not present

## 2023-12-01 DIAGNOSIS — R059 Cough, unspecified: Secondary | ICD-10-CM | POA: Diagnosis not present

## 2023-12-01 DIAGNOSIS — Z7984 Long term (current) use of oral hypoglycemic drugs: Secondary | ICD-10-CM | POA: Diagnosis not present

## 2023-12-01 DIAGNOSIS — U071 COVID-19: Secondary | ICD-10-CM | POA: Diagnosis not present

## 2023-12-01 DIAGNOSIS — I509 Heart failure, unspecified: Secondary | ICD-10-CM | POA: Insufficient documentation

## 2023-12-01 DIAGNOSIS — I10 Essential (primary) hypertension: Secondary | ICD-10-CM | POA: Insufficient documentation

## 2023-12-01 DIAGNOSIS — E119 Type 2 diabetes mellitus without complications: Secondary | ICD-10-CM | POA: Diagnosis not present

## 2023-12-01 DIAGNOSIS — Z7982 Long term (current) use of aspirin: Secondary | ICD-10-CM | POA: Diagnosis not present

## 2023-12-01 DIAGNOSIS — Z79899 Other long term (current) drug therapy: Secondary | ICD-10-CM | POA: Diagnosis not present

## 2023-12-01 DIAGNOSIS — J45909 Unspecified asthma, uncomplicated: Secondary | ICD-10-CM | POA: Diagnosis not present

## 2023-12-01 DIAGNOSIS — I110 Hypertensive heart disease with heart failure: Secondary | ICD-10-CM | POA: Insufficient documentation

## 2023-12-01 DIAGNOSIS — I2510 Atherosclerotic heart disease of native coronary artery without angina pectoris: Secondary | ICD-10-CM | POA: Diagnosis not present

## 2023-12-01 DIAGNOSIS — R0602 Shortness of breath: Secondary | ICD-10-CM | POA: Diagnosis not present

## 2023-12-01 LAB — CBC
HCT: 41.7 % (ref 36.0–46.0)
Hemoglobin: 14.4 g/dL (ref 12.0–15.0)
MCH: 30.4 pg (ref 26.0–34.0)
MCHC: 34.5 g/dL (ref 30.0–36.0)
MCV: 88.2 fL (ref 80.0–100.0)
Platelets: 342 10*3/uL (ref 150–400)
RBC: 4.73 MIL/uL (ref 3.87–5.11)
RDW: 12 % (ref 11.5–15.5)
WBC: 6.7 10*3/uL (ref 4.0–10.5)
nRBC: 0 % (ref 0.0–0.2)

## 2023-12-01 LAB — POC INFLUENZA A&B (BINAX/QUICKVUE)
Influenza A, POC: NEGATIVE
Influenza B, POC: NEGATIVE

## 2023-12-01 LAB — BASIC METABOLIC PANEL
Anion gap: 12 (ref 5–15)
BUN: 31 mg/dL — ABNORMAL HIGH (ref 8–23)
CO2: 24 mmol/L (ref 22–32)
Calcium: 10 mg/dL (ref 8.9–10.3)
Chloride: 98 mmol/L (ref 98–111)
Creatinine, Ser: 1.22 mg/dL — ABNORMAL HIGH (ref 0.44–1.00)
GFR, Estimated: 46 mL/min — ABNORMAL LOW (ref >60)
Glucose, Bld: 161 mg/dL — ABNORMAL HIGH (ref 70–99)
Potassium: 4.5 mmol/L (ref 3.5–5.1)
Sodium: 134 mmol/L — ABNORMAL LOW (ref 135–145)

## 2023-12-01 LAB — RESP PANEL BY RT-PCR (RSV, FLU A&B, COVID)  RVPGX2
Influenza A by PCR: NEGATIVE
Influenza B by PCR: NEGATIVE
Resp Syncytial Virus by PCR: NEGATIVE
SARS Coronavirus 2 by RT PCR: POSITIVE — AB

## 2023-12-01 LAB — BRAIN NATRIURETIC PEPTIDE: B Natriuretic Peptide: 30.4 pg/mL (ref 0.0–100.0)

## 2023-12-01 MED ORDER — LACTATED RINGERS IV BOLUS
500.0000 mL | Freq: Once | INTRAVENOUS | Status: AC
  Administered 2023-12-01: 500 mL via INTRAVENOUS

## 2023-12-01 MED ORDER — ALBUTEROL SULFATE (2.5 MG/3ML) 0.083% IN NEBU: 5.0000 mg | INHALATION_SOLUTION | Freq: Once | RESPIRATORY_TRACT | Status: AC

## 2023-12-01 MED ORDER — ALBUTEROL SULFATE (2.5 MG/3ML) 0.083% IN NEBU
INHALATION_SOLUTION | RESPIRATORY_TRACT | Status: AC
  Administered 2023-12-01: 5 mg via RESPIRATORY_TRACT
  Filled 2023-12-01: qty 6

## 2023-12-01 MED ORDER — ALBUTEROL SULFATE HFA 108 (90 BASE) MCG/ACT IN AERS: 2.0000 | INHALATION_SPRAY | RESPIRATORY_TRACT | Status: DC | PRN

## 2023-12-01 MED ORDER — LACTATED RINGERS IV BOLUS: 1000.0000 mL | Freq: Once | INTRAVENOUS | Status: DC

## 2023-12-01 MED ORDER — ALBUTEROL SULFATE HFA 108 (90 BASE) MCG/ACT IN AERS
INHALATION_SPRAY | RESPIRATORY_TRACT | Status: AC
  Administered 2023-12-01: 2 via RESPIRATORY_TRACT
  Filled 2023-12-01: qty 6.7

## 2023-12-01 NOTE — ED Triage Notes (Signed)
Sent from doctors office due to low blood pressure and short of breath  Labored breathing Diminished throughout.  Productive cough.

## 2023-12-01 NOTE — Patient Instructions (Signed)
 It was great to see you!  Please go to the emergency room --> [address], [place], [place] [address]  Take care,  Alexander Iba PA-C

## 2023-12-01 NOTE — Discharge Instructions (Addendum)
 Felicia Perry,  You presented to the ED with hypotension, shortness of breath, and dehydration. This was likely from your dehydration and diarrhea. Your covid test was positive. You received fluids and breathing treatments which improved your symptoms. At this time, we feel you are safe to return home.   Acute covid viral symptoms can last 5-7 days on average.  If your breathing does not improve or worsens in the coming days, then please return to follow-up. Also please continue to ensure that you are adequately hydrated.

## 2023-12-01 NOTE — ED Provider Notes (Signed)
 [place] [facility] DEPARTMENT AT [facility] Provider Note   CSN: [id number] Arrival date & time: 12/01/23  1140  History Past Medical History:  Diagnosis Date   Adenomatous polyp 11/23/2005   Anxiety    01/09/22- patient denies anxiety   Asthma 2003   Basal cell carcinoma 2021   right cheek per patient at [place] [facility]   Carotid stenosis    Chronic diastolic CHF (congestive heart failure) (HCC) 2017   Echo 2021 was normal   COPD (chronic obstructive pulmonary disease) (HCC)    Coronary artery disease 2017   a. HC on 08/14/16 showed RCA CTO s/p PCI, 60% LCX disease managed medically with recs to consider staged PCI if continued symptoms.    Depression 2008   pt reports MD diagnosed but she does not feel depressed   Diabetes mellitus without complication (HCC)    DIVERTICULOSIS, COLON 04/22/2007        GERD (gastroesophageal reflux disease) 2012   History of radiation therapy    Right breast- 03/04/22-04/02/22- Dr. Lynwood Nasuti   History of shingles    Hypertension 2008   Hypertensive heart disease    Insomnia 2003   Internal hemorrhoids    Osteoarthritis 2008   knees, hands, lower back (08/14/2016)   Pap smear for cervical cancer screening 2002   results normal   Personal history of radiation therapy    PUD (peptic ulcer disease) 1989   Seasonal allergies 2013   Sleep apnea    a. resolved with weight loss   Chief Complaint  Patient presents with   Shortness of Breath   Hypotension   Felicia Perry is a 78 year old female with a history of asthma, type 2 diabetes, coronary artery disease, diastolic heart failure, and hypertension presenting to the ED with a 1 day history of shortness of breath and hypotension.  Patient notes that she began feeling ill a few days ago.  Of note, her son who lives with her recently has been sick with a viral infection.  Patient was initially okay but yesterday, she began to develop diarrhea and shortness of breath.  She  endorses passing 5 stools yesterday that were clear in appearance.  Her shortness of breath is temporarily relieved by albuterol  but returns.  She notes good adherence with her medication.  She also endorses a concomitant productive cough and nasal congestion but denies any chest pain or edema.  She does also note generalized abdominal pain.   Shortness of Breath Severity:  Moderate Onset quality:  Gradual Duration:  1 day Timing:  Constant Chronicity:  New Context: activity   Relieved by:  Inhaler Worsened by:  Exertion Associated symptoms: abdominal pain and sputum production    Home Medications Prior to Admission medications   Medication Sig Start Date End Date Taking? Authorizing Provider  acetaminophen  (TYLENOL ) 325 MG tablet Take 650 mg by mouth every 6 (six) hours as needed for mild pain, moderate pain or headache.    [provider]  albuterol  (VENTOLIN  HFA) 108 (90 Base) MCG/ACT inhaler Inhale 2 puffs into the lungs every 6 (six) hours as needed for wheezing or shortness of breath. 01/23/20   Katrinka Garnette KIDD, MD  ALPRAZolam  (XANAX ) 1 MG tablet TAKE ONE TABLET BY MOUTH AT BEDTIME AS NEEDED FOR SLEEP -do not drive FOR EIGHT hours AFTER taking 11/09/23   Katrinka Garnette KIDD, MD  amLODipine  (NORVASC ) 10 MG tablet Take 1 tablet (10 mg total) by mouth daily. 05/05/23   Hobart Powell BRAVO, MD  anastrozole  (ARIMIDEX ) 1 MG tablet TAKE ONE TABLET DAILY 06/25/23   Iruku, Praveena, MD  aspirin  EC 81 MG tablet Take 1 tablet (81 mg total) by mouth daily. Swallow whole. 07/01/21   Lelon Hamilton T, PA-C  Bempedoic Acid -Ezetimibe  (NEXLIZET ) 180-10 MG TABS Take 180 mg by mouth daily. 10/30/22   Hobart Powell BRAVO, MD  budesonide -formoterol  (SYMBICORT ) 160-4.5 MCG/ACT inhaler Inhale 2 puffs into the lungs 2 (two) times daily. 11/23/23   Katrinka Garnette KIDD, MD  carvedilol  (COREG ) 25 MG tablet Take 1 tablet (25 mg total) by mouth 2 (two) times daily with a meal. 09/20/23   Lucien Orren SAILOR, PA-C   famotidine  (PEPCID ) 20 MG tablet TAKE ONE TABLET TWICE DAILY 03/18/23   Katrinka Garnette KIDD, MD  irbesartan  (AVAPRO ) 300 MG tablet Take 1 tablet (300 mg total) by mouth daily. 04/13/23   Hobart Powell BRAVO, MD  Ketoconazole -Hydrocortisone  2-2.5 % CREA Apply twice daily. 11/09/23   Kennyth Worth HERO, MD  levocetirizine (XYZAL ) 5 MG tablet TAKE ONE TABLET BY MOUTH EVERY MORNING 05/05/23   Katrinka Garnette KIDD, MD  metFORMIN  (GLUCOPHAGE ) 500 MG tablet Take 1 tablet (500 mg total) by mouth 2 (two) times daily with a meal. 11/24/23   Katrinka Garnette KIDD, MD  omega-3 acid ethyl esters (LOVAZA ) 1 g capsule TAKE TWO CAPSULES EVERY DAY 08/20/21   Hobart Powell BRAVO, MD  ondansetron  (ZOFRAN ) 4 MG tablet Take 1 tablet (4 mg total) by mouth every 8 (eight) hours as needed for nausea or vomiting. Take prior to antibiotic. 06/22/23   Lucius Krabbe, NP  spironolactone  (ALDACTONE ) 25 MG tablet Take 0.5 tablets (12.5 mg total) by mouth daily. 09/20/23   Lucien Orren SAILOR, PA-C  venlafaxine  XR (EFFEXOR -XR) 75 MG 24 hr capsule TAKE ONE CAPSULE DAILY WITH BREAKFAST 08/31/23   Smith, Zachary M, DO  Vitamin D , Ergocalciferol , (DRISDOL ) 1.25 MG (50000 UNIT) CAPS capsule Take 1 capsule (50,000 Units total) by mouth every 7 (seven) days. 11/23/23   Katrinka Garnette KIDD, MD      Allergies    Penicillins, Cephalosporins, Citalopram  hydrobromide, Rosuvastatin , Zolpidem tartrate, Clarithromycin, and Levofloxacin    Review of Systems   Review of Systems  Respiratory:  Positive for sputum production and shortness of breath.   Gastrointestinal:  Positive for abdominal pain.    Physical Exam Updated Vital Signs BP (!) 97/54 (BP Location: Right Arm)   Pulse 71   Temp 97.9 F (36.6 C)   Resp 19   Ht 5' (1.524 m)   Wt 72.8 kg   SpO2 96%   BMI 31.34 kg/m  Physical Exam HENT:     Head: Normocephalic and atraumatic.  Pulmonary:     Effort: Accessory muscle usage and respiratory distress present.     Breath sounds: Normal breath  sounds.  Abdominal:     General: Abdomen is flat.     Palpations: Abdomen is soft.     Tenderness: There is generalized abdominal tenderness.  Musculoskeletal:     Right lower leg: No edema.     Left lower leg: No edema.  Skin:    General: Skin is warm and dry.  Neurological:     General: No focal deficit present.  Psychiatric:        Mood and Affect: Mood normal.        Behavior: Behavior normal.    ED Results / Procedures / Treatments   Labs Labs Reviewed  BASIC METABOLIC PANEL - Abnormal; Notable for the following components:  Result Value   Sodium 134 (*)    Glucose, Bld 161 (*)    BUN 31 (*)    Creatinine, Ser 1.22 (*)    GFR, Estimated 46 (*)    All other components within normal limits  RESP PANEL BY RT-PCR (RSV, FLU A&B, COVID)  RVPGX2  CBC  BRAIN NATRIURETIC PEPTIDE   EKG NSR  Radiology No results found.  Medications Ordered in ED Medications  albuterol  (VENTOLIN  HFA) 108 (90 Base) MCG/ACT inhaler 2 puff (2 puffs Inhalation Given 12/01/23 1222)  albuterol  (PROVENTIL ) (2.5 MG/3ML) 0.083% nebulizer solution 5 mg (5 mg Nebulization Given 12/01/23 1240)   ED Course/ Medical Decision Making/ A&P Medical Decision Making 78 year old female presenting with 1 day history of shortness of breath, hypertension, and diarrhea.  Differential diagnosis includes but not limited to pneumonia, viral infection, COPD exacerbation, asthma exacerbation, or heart failure exacerbation.  Amount and/or Complexity of Data Reviewed Labs: ordered.    Details: CBC largely unremarkable.  BMP notable for mild hyponatremia of 134 and AKI with creatinine of 1.22 (baseline approximately 0.8-0.9).  BNP WNL.  Respiratory panel positive for COVID. Radiology: ordered.    Details: Chest x-ray unremarkable. ECG/medicine tests: ordered.    Details: Normal sinus rhythm Discussion of management or test interpretation with external provider(s): Patient has history of prosthetic heart failure, type 2  diabetes, hypertension, coronary artery disease, and asthma.  She also notes to have a history of COPD but has no formal diagnosis per chart review.  Her current presentation is likely secondary to COVID after her sick exposure to her son.  This likely exacerbated the patient's asthma and possible COPD leading to her shortness of breath.  Her diarrhea and current illness also likely precipitated her AKI.  She was given 500 mL of fluids. She noted improvement in her shortness of breath after ner duonebs. Her lightheadedness and hypotension will continue to be monitored after fluids. Her fluid status will also be appropriately monitored with concerns to her heart failure. The patient continued to improve after fluids and was stable for discharge.   Risk Prescription drug management.   Final Clinical Impression(s) / ED Diagnoses Final diagnoses:  None   Rx / DC Orders ED Discharge Orders     None        Stephanie Freund, MD 12/02/23 [id number]    Tonia Chew, MD 12/22/23 1212

## 2023-12-01 NOTE — Progress Notes (Signed)
 Felicia Perry is a 78 y.o. female here for a new problem.  History of Present Illness:   Chief Complaint  Patient presents with   Headache    Pt c/o headache x 2 days, ears stopped up and diarrhea started today.Also cough    Headaches She complains of headaches that started several days ago  Associated symptoms include ear pressure, fever, body aches, and SOB. Reports epigastric pain due to coughing and diarrhea that started earlier today. She states her son is also sick not sure with what. Has been taking tylenol  with minimal relief. Blood pressure is 98/50 today. She doesn't feel lightheaded at this time.  She's currently 8 pounds lighter than last office visit. Reports compliance with her Symbicort . Has not needed to use her albuterol .  Denies any chest pain, leg swelling, or chills.   Past Medical History:  Diagnosis Date   Adenomatous polyp 11/23/2005   Anxiety    01/09/22- patient denies anxiety   Asthma 2003   Basal cell carcinoma 2021   right cheek per patient at [place] [facility]   Carotid stenosis    Chronic diastolic CHF (congestive heart failure) (HCC) 2017   Echo 2021 was normal   COPD (chronic obstructive pulmonary disease) (HCC)    Coronary artery disease 2017   a. HC on 08/14/16 showed RCA CTO s/p PCI, 60% LCX disease managed medically with recs to consider staged PCI if continued symptoms.    Depression 2008   pt reports MD diagnosed but she does not feel depressed   Diabetes mellitus without complication (HCC)    DIVERTICULOSIS, COLON 04/22/2007        GERD (gastroesophageal reflux disease) 2012   History of radiation therapy    Right breast- 03/04/22-04/02/22- Dr. Lynwood Nasuti   History of shingles    Hypertension 2008   Hypertensive heart disease    Insomnia 2003   Internal hemorrhoids    Osteoarthritis 2008   knees, hands, lower back (08/14/2016)   Pap smear for cervical cancer screening 2002   results normal   Personal history of  radiation therapy    PUD (peptic ulcer disease) 1989   Seasonal allergies 2013   Sleep apnea    a. resolved with weight loss     Social History   Tobacco Use   Smoking status: Former    Current packs/day: 0.00    Average packs/day: 1 pack/day for 26.0 years (26.0 ttl pk-yrs)    Types: Cigarettes    Start date: 1963    Quit date: 1989    Years since quitting: 36.1   Smokeless tobacco: Never  Vaping Use   Vaping status: Never Used  Substance Use Topics   Alcohol use: No   Drug use: No    Past Surgical History:  Procedure Laterality Date   BREAST BIOPSY Right 12/30/2021   BREAST EXCISIONAL BIOPSY Left    1980's x 3 - all benign   BREAST EXCISIONAL BIOPSY Right    1980's x 1 or 2 - all benign   BREAST LUMPECTOMY Right 01/22/2022   BREAST LUMPECTOMY WITH RADIOACTIVE SEED LOCALIZATION Right 01/22/2022   Procedure: RIGHT BREAST LUMPECTOMY WITH RADIOACTIVE SEED LOCALIZATION;  Surgeon: Vanderbilt Ned, MD;  Location: [facility];  Service: General;  Laterality: Right;   BREAST SURGERY Left 1980s   Fibrous Tumors removed    CARDIAC CATHETERIZATION N/A 08/14/2016   Procedure: Left Heart Cath and Coronary Angiography;  Surgeon: Ozell Fell, MD;  Location: [facility] INVASIVE CV LAB;  Service: Cardiovascular;  Laterality: N/A;   CARDIAC CATHETERIZATION N/A 08/14/2016   Procedure: Coronary Stent Intervention;  Surgeon: Ozell Fell, MD;  Location: [facility] INVASIVE CV LAB;  Service: Cardiovascular;  Laterality: N/A;   CATARACT EXTRACTION W/ INTRAOCULAR LENS  IMPLANT, BILATERAL Bilateral 05/2009   COLONOSCOPY N/A 2016   Cologuard 2019   CORONARY ANGIOPLASTY     detached retina left eye     may 2019   LEFT HEART CATH AND CORONARY ANGIOGRAPHY N/A 09/25/2022   Procedure: LEFT HEART CATH AND CORONARY ANGIOGRAPHY;  Surgeon: Verlin Lonni BIRCH, MD;  Location: [facility] INVASIVE CV LAB;  Service: Cardiovascular;  Laterality: N/A;   TUBAL LIGATION  1970s    Family History  Problem  Relation Age of Onset   Heart disease Mother    Hypertension Mother    Diabetes Mother    Dementia Mother    COPD Father    Dementia Maternal Grandmother    Colon cancer Neg Hx     Allergies  Allergen Reactions   Penicillins Anaphylaxis    REACTION: per patient causes rash,hives   Cephalosporins Swelling    REACTION: tongue swelling   Citalopram  Hydrobromide Other (See Comments)    psychosis   Rosuvastatin  Other (See Comments)    Pt reports causes bilateral lower extremity muscle aches.    Zolpidem Tartrate     REACTION: difficulty with concentration   Clarithromycin Rash    REACTION: blisters in mouth   Levofloxacin Rash    REACTION: tongue swells    Current Medications:   Current Outpatient Medications:    acetaminophen  (TYLENOL ) 325 MG tablet, Take 650 mg by mouth every 6 (six) hours as needed for mild pain, moderate pain or headache., Disp: , Rfl:    albuterol  (VENTOLIN  HFA) 108 (90 Base) MCG/ACT inhaler, Inhale 2 puffs into the lungs every 6 (six) hours as needed for wheezing or shortness of breath., Disp: 18 g, Rfl: 5   ALPRAZolam  (XANAX ) 1 MG tablet, TAKE ONE TABLET BY MOUTH AT BEDTIME AS NEEDED FOR SLEEP -do not drive FOR EIGHT hours AFTER taking, Disp: 90 tablet, Rfl: 1   amLODipine  (NORVASC ) 10 MG tablet, Take 1 tablet (10 mg total) by mouth daily., Disp: 90 tablet, Rfl: 2   anastrozole  (ARIMIDEX ) 1 MG tablet, TAKE ONE TABLET DAILY, Disp: 30 tablet, Rfl: 3   aspirin  EC 81 MG tablet, Take 1 tablet (81 mg total) by mouth daily. Swallow whole., Disp: 90 tablet, Rfl: 3   Bempedoic Acid -Ezetimibe  (NEXLIZET ) 180-10 MG TABS, Take 180 mg by mouth daily., Disp: 90 tablet, Rfl: 3   budesonide -formoterol  (SYMBICORT ) 160-4.5 MCG/ACT inhaler, Inhale 2 puffs into the lungs 2 (two) times daily., Disp: 1 each, Rfl: 11   carvedilol  (COREG ) 25 MG tablet, Take 1 tablet (25 mg total) by mouth 2 (two) times daily with a meal., Disp: 180 tablet, Rfl: 1   famotidine  (PEPCID ) 20 MG tablet,  TAKE ONE TABLET TWICE DAILY, Disp: 60 tablet, Rfl: 5   irbesartan  (AVAPRO ) 300 MG tablet, Take 1 tablet (300 mg total) by mouth daily., Disp: 90 tablet, Rfl: 3   Ketoconazole -Hydrocortisone  2-2.5 % CREA, Apply twice daily., Disp: 30 g, Rfl: 0   levocetirizine (XYZAL ) 5 MG tablet, TAKE ONE TABLET BY MOUTH EVERY MORNING, Disp: 90 tablet, Rfl: 3   metFORMIN  (GLUCOPHAGE ) 500 MG tablet, Take 1 tablet (500 mg total) by mouth 2 (two) times daily with a meal., Disp: 180 tablet, Rfl: 3   omega-3 acid ethyl esters (LOVAZA ) 1 g capsule, TAKE  TWO CAPSULES EVERY DAY, Disp: 180 capsule, Rfl: 3   ondansetron  (ZOFRAN ) 4 MG tablet, Take 1 tablet (4 mg total) by mouth every 8 (eight) hours as needed for nausea or vomiting. Take prior to antibiotic., Disp: 20 tablet, Rfl: 0   spironolactone  (ALDACTONE ) 25 MG tablet, Take 0.5 tablets (12.5 mg total) by mouth daily., Disp: 45 tablet, Rfl: 1   venlafaxine  XR (EFFEXOR -XR) 75 MG 24 hr capsule, TAKE ONE CAPSULE DAILY WITH BREAKFAST, Disp: 90 capsule, Rfl: 2   Vitamin D , Ergocalciferol , (DRISDOL ) 1.25 MG (50000 UNIT) CAPS capsule, Take 1 capsule (50,000 Units total) by mouth every 7 (seven) days., Disp: 13 capsule, Rfl: 1   Review of Systems:   Review of Systems  Constitutional:  Positive for fever. Negative for chills.  HENT:  Positive for ear pain.   Respiratory:  Positive for shortness of breath.   Cardiovascular:  Negative for chest pain and leg swelling.  Gastrointestinal:  Positive for diarrhea.  Musculoskeletal:        +body aches  Neurological:  Positive for headaches.    Vitals:   Vitals:   12/01/23 1100 12/01/23 1143  BP: (!) 98/50 (!) 90/54  Pulse: 69   Temp: (!) 97.3 F (36.3 C)   TempSrc: Temporal   SpO2: 99%   Weight: 160 lb 8 oz (72.8 kg)   Height: 5' (1.524 m)      Body mass index is 31.35 kg/m.  Physical Exam:   Physical Exam Vitals and nursing note reviewed.  Constitutional:      General: She is not in acute distress.     Appearance: She is well-developed. She is ill-appearing. She is not toxic-appearing.  Cardiovascular:     Rate and Rhythm: Normal rate and regular rhythm.     Pulses: Normal pulses.     Heart sounds: Normal heart sounds, S1 normal and S2 normal.  Pulmonary:     Effort: Pulmonary effort is normal.     Breath sounds: Normal breath sounds.  Skin:    General: Skin is warm and dry.  Neurological:     Mental Status: She is alert.     GCS: GCS eye subscore is 4. GCS verbal subscore is 5. GCS motor subscore is 6.  Psychiatric:        Speech: Speech normal.        Behavior: Behavior normal. Behavior is cooperative.    Results for orders placed or performed in visit on 12/01/23  POC Influenza A&B(BINAX/QUICKVUE)  Result Value Ref Range   Influenza A, POC Negative Negative   Influenza B, POC Negative Negative     Assessment and Plan:   Flu-like symptoms; Hypotension, unspecified hypotension type Appears quite ill Due to cardiac history and significant hypotension, recommend ER evaluation -- she is agreeable and her son Felicia Perry) was present and able to take her to ER   Lucie Buttner, PA-C  I,Safa M Kadhim,acting as a scribe for Lucie Buttner, PA.,have documented all relevant documentation on the behalf of Lucie Buttner, PA,as directed by  Lucie Buttner, PA while in the presence of Lucie Buttner, [place].   I, Lucie Buttner, [place], have reviewed all documentation for this visit. The documentation on 12/01/23 for the exam, diagnosis, procedures, and orders are all accurate and complete.

## 2023-12-06 ENCOUNTER — Encounter: Payer: Self-pay | Admitting: Family Medicine

## 2023-12-06 ENCOUNTER — Ambulatory Visit (INDEPENDENT_AMBULATORY_CARE_PROVIDER_SITE_OTHER): Payer: Medicare Other | Admitting: Family Medicine

## 2023-12-06 VITALS — BP 126/68 | HR 72 | Temp 97.5°F | Ht 60.0 in | Wt 161.8 lb

## 2023-12-06 DIAGNOSIS — J4540 Moderate persistent asthma, uncomplicated: Secondary | ICD-10-CM

## 2023-12-06 DIAGNOSIS — E118 Type 2 diabetes mellitus with unspecified complications: Secondary | ICD-10-CM

## 2023-12-06 DIAGNOSIS — Z7984 Long term (current) use of oral hypoglycemic drugs: Secondary | ICD-10-CM

## 2023-12-06 DIAGNOSIS — U071 COVID-19: Secondary | ICD-10-CM

## 2023-12-06 DIAGNOSIS — I10 Essential (primary) hypertension: Secondary | ICD-10-CM

## 2023-12-06 MED ORDER — PREDNISONE 20 MG PO TABS: ORAL_TABLET | ORAL | 0 refills | Status: DC

## 2023-12-06 NOTE — Progress Notes (Signed)
 Phone [phone] In person visit   Subjective:   Felicia Perry is a 78 y.o. year old very pleasant female patient who presents for/with See problem oriented charting Chief Complaint  Patient presents with   Hospital Follow up     Hospital follow up recent Dx of COIVD     Past Medical History-  Patient Active Problem List   Diagnosis Date Noted   Ductal carcinoma in situ (DCIS) of right breast 01/05/2022    Priority: High   Diabetes mellitus type 2 with complications (HCC) 02/05/2021    Priority: High   Carotid stenosis 01/23/2020    Priority: High   Chronic diastolic CHF (congestive heart failure) (HCC) 01/12/2019    Priority: High   Coronary artery disease involving native coronary artery of native heart without angina pectoris     Priority: High   Syncope 08/04/2016    Priority: High   statin myalgia 06/25/2020    Priority: Medium    Hyperlipidemia associated with type 2 diabetes mellitus (HCC) 07/11/2018    Priority: Medium    H/O hyperparathyroidism 08/18/2016    Priority: Medium    Vitamin D  deficiency 08/20/2015    Priority: Medium    Chronic pain syndrome 03/26/2015    Priority: Medium    Insomnia 07/13/2014    Priority: Medium    GERD (gastroesophageal reflux disease) 02/25/2011    Priority: Medium    Depression 04/22/2007    Priority: Medium    Essential hypertension 04/22/2007    Priority: Medium    Asthma 04/22/2007    Priority: Medium    Greater trochanteric bursitis of left hip 08/13/2020    Priority: Low   Senile purpura (HCC) 08/07/2020    Priority: Low   Degenerative arthritis of knee, bilateral 12/08/2018    Priority: Low   Partial hamstring tear, initial encounter 11/16/2018    Priority: Low   Nonallopathic lesion of sacral region 07/05/2018    Priority: Low   Nonallopathic lesion of cervical region 07/05/2018    Priority: Low   DDD (degenerative disc disease), cervical 05/04/2018    Priority: Low   Degenerative arthritis of left  knee 09/15/2017    Priority: Low   PUD (peptic ulcer disease)     Priority: Low   Hyperglycemia 08/06/2015    Priority: Low   Xerosis of skin 08/06/2015    Priority: Low   Hypersomnia with sleep apnea 03/26/2015    Priority: Low   Nonallopathic lesion of lumbosacral region 11/13/2014    Priority: Low   Osteoarthritis of left lower extremity 10/17/2014    Priority: Low   Chronic meniscal tear of knee 10/17/2014    Priority: Low   Nonallopathic lesion of thoracic region 09/14/2014    Priority: Low   Nonallopathic lesion-rib cage 08/21/2014    Priority: Low   Tendinopathy of right rotator cuff 07/30/2014    Priority: Low   Piriformis syndrome of right side 07/30/2014    Priority: Low   Limited joint range of motion 07/13/2014    Priority: Low   Rash 07/13/2014    Priority: Low   SI (sacroiliac) joint dysfunction 10/10/2007    Priority: Low   LOW BACK PAIN 07/01/2007    Priority: Low   Shortness of breath 09/25/2022   Genetic testing 01/19/2022   Intractable headache 01/02/2021    Medications- reviewed and updated Current Outpatient Medications  Medication Sig Dispense Refill   acetaminophen  (TYLENOL ) 325 MG tablet Take 650 mg by mouth every 6 (six)  hours as needed for mild pain, moderate pain or headache.     albuterol  (VENTOLIN  HFA) 108 (90 Base) MCG/ACT inhaler Inhale 2 puffs into the lungs every 6 (six) hours as needed for wheezing or shortness of breath. 18 g 5   ALPRAZolam  (XANAX ) 1 MG tablet TAKE ONE TABLET BY MOUTH AT BEDTIME AS NEEDED FOR SLEEP -do not drive FOR EIGHT hours AFTER taking 90 tablet 1   amLODipine  (NORVASC ) 10 MG tablet Take 1 tablet (10 mg total) by mouth daily. 90 tablet 2   anastrozole  (ARIMIDEX ) 1 MG tablet TAKE ONE TABLET DAILY 30 tablet 3   aspirin  EC 81 MG tablet Take 1 tablet (81 mg total) by mouth daily. Swallow whole. 90 tablet 3   Bempedoic Acid -Ezetimibe  (NEXLIZET ) 180-10 MG TABS Take 180 mg by mouth daily. 90 tablet 3    budesonide -formoterol  (SYMBICORT ) 160-4.5 MCG/ACT inhaler Inhale 2 puffs into the lungs 2 (two) times daily. 1 each 11   carvedilol  (COREG ) 25 MG tablet Take 1 tablet (25 mg total) by mouth 2 (two) times daily with a meal. 180 tablet 1   famotidine  (PEPCID ) 20 MG tablet TAKE ONE TABLET TWICE DAILY 60 tablet 5   irbesartan  (AVAPRO ) 300 MG tablet Take 1 tablet (300 mg total) by mouth daily. 90 tablet 3   Ketoconazole -Hydrocortisone  2-2.5 % CREA Apply twice daily. 30 g 0   levocetirizine (XYZAL ) 5 MG tablet TAKE ONE TABLET BY MOUTH EVERY MORNING 90 tablet 3   metFORMIN  (GLUCOPHAGE ) 500 MG tablet Take 1 tablet (500 mg total) by mouth 2 (two) times daily with a meal. 180 tablet 3   omega-3 acid ethyl esters (LOVAZA ) 1 g capsule TAKE TWO CAPSULES EVERY DAY 180 capsule 3   ondansetron  (ZOFRAN ) 4 MG tablet Take 1 tablet (4 mg total) by mouth every 8 (eight) hours as needed for nausea or vomiting. Take prior to antibiotic. 20 tablet 0   predniSONE  (DELTASONE ) 20 MG tablet Take 1 tablet by mouth daily for 5 days, then 1/2 tablet daily for 2 days 6 tablet 0   spironolactone  (ALDACTONE ) 25 MG tablet Take 0.5 tablets (12.5 mg total) by mouth daily. 45 tablet 1   venlafaxine  XR (EFFEXOR -XR) 75 MG 24 hr capsule TAKE ONE CAPSULE DAILY WITH BREAKFAST 90 capsule 2   Vitamin D , Ergocalciferol , (DRISDOL ) 1.25 MG (50000 UNIT) CAPS capsule Take 1 capsule (50,000 Units total) by mouth every 7 (seven) days. 13 capsule 1   No current facility-administered medications for this visit.     Objective:  BP 126/68 (BP Location: Left Arm, Patient Position: Sitting, Cuff Size: Normal)   Pulse 72   Temp (!) 97.5 F (36.4 C) (Temporal)   Ht 5' (1.524 m)   Wt 161 lb 12.8 oz (73.4 kg)   SpO2 96%   BMI 31.60 kg/m  Gen: NAD, resting comfortably CV: Stable murmur Lungs: CTAB no crackles, wheeze, rhonchi Abdomen: soft/nontender/nondistended Ext: Minimal edema Skin: warm, dry     Assessment and Plan   # COVID-19 ED  follow-up S: Patient was seen in our office 12/01/2023 with negative flu test but complaining of headache for 2 days, ear stopped up, diarrhea and she appeared quite ill with hypotension so she was recommended to go to the emergency department-her son Lavonia Powers took her there  Her CBC was largely reassuring.  BMP with mild hyponatremia and had mild increase in her creatinine.  Respiratory panel positive for COVID.  BNP was not elevated.  Chest x-ray unremarkable.  There was concern  that COVID was exacerbating her asthma and that her diarrhea was causing her acute kidney injury.  She was given a fluid bolus and she noted improvement in her breathing after DuoNebs and ultimately was discharged home with improvement -having headache with this- mainly frontal. Tylenol  was taken before but she's trying to pull back - does not appear was offered Paxlovid and I don't see a clear contraindication  Today reports: -finally able to eat some this morning- has some Zofran  if needed -feels winded with this with some wheeze but getting better- using Symbicort . Albuterol  helpful as well -some right ear pressure noted as well -sleeping ok -mild memory loss - diarrhea at 3-4 episodes per day- improving- plus adding solids in today may help. She's getting lots of fluids in.  A/P:  Patient with testing confirming covid 19 with first day of covid 19 symptoms in ED Vaccination status: last vaccine 2021 -Ongoing headache and seems to flare her asthma so we are going to trial low-dose prednisone .  With diabetes do not feel strongly about high-dose prednisone  so using a lower dose  Therefore: - recommended patient watch closely for shortness of breath or confusion or worsening symptoms and if those occur patient should contact us  immediately or seek care in the emergency department -recommended patient consider purchasing pulse oximeter and if levels 94% or below persistently- seek care at the hospital (very unlikely for this  to happen without significant underlying respiratory disease) - Isolation guidelines per CDC as of march 2024- "The recommendations suggest returning to normal activities when, for at least 24 hours, symptoms are improving overall, and if a fever was present, it has been gone without use of a fever-reducing medication. Once people resume normal activities, they are encouraged to take additional prevention strategies for the next 5 days to curb disease spread, such as taking more steps for cleaner air, enhancing hygiene practices, wearing a well-fitting mask, keeping a distance from others, and/or getting tested for respiratory viruses." -Patient should inform close contacts about exposure (anyone patient been around unmasked for more than 15 minutes)  - too late for paxlovid at this point-not entirely clear what she was not treated in the emergency department with this as she has multiple risk factors  # Diabetes-diagnosed 08/07/2020 after having second A1c 6.5 or above S: Medication: Metformin  500 mg twice daily Lab Results  Component Value Date   HGBA1C 8.1 (H) 11/23/2023   HGBA1C 6.5 05/13/2023   HGBA1C 6.8 (H) 11/12/2022  A/P: A1c has been poorly controlled-hopefully improving on metformin  now.  Sugar could go up on prednisone  so using lower dose but I do think she could benefit from this from asthma and headache perspective  #hypertension #Chronic diastolic CHF S: medication: Amlodipine  10 mg, irbesartan  300 mg, coreg  25 mg BID spironolactone  25 mg A/P: Blood pressure well-controlled-continue current medication.  CHF appears euvolemic    # Asthma/allergies S: Maintenance Medication: Reports compliance with Symbicort  160-4.52 puffs.  For allergies-on Xyzal  As needed medication: Albuterol -some increasing usage with illness.  A/P: Asthma with mild poor control-prednisone  trial as above  Recommended follow up: Return for next already scheduled visit or sooner if needed. Future Appointments   Date Time Provider Department Center  01/04/2024  2:00 PM Iruku, Praveena, MD [facility] None  02/14/2024 10:20 AM Almira Jaeger, MD [facility]  02/15/2024  9:00 AM Isidro Margo, DO [facility] None  04/10/2024  4:00 PM [facility] ANNUAL WELLNESS VISIT 1 [facility]  05/23/2024  2:00 PM [facility]  V[facility] 4 [facility]  05/23/2024  2:40 PM Carlene Che, MD [facility]  05/29/2024 10:00 AM Almira Jaeger, MD [facility]    Lab/Order associations:   ICD-10-CM   1. COVID-19  U07.1     2. Essential hypertension  I10 Comprehensive metabolic panel    3. Diabetes mellitus type 2 with complications (HCC)  E11.8     4. Moderate persistent asthma without complication  J45.40      Meds ordered this encounter  Medications   predniSONE  (DELTASONE ) 20 MG tablet    Sig: Take 1 tablet by mouth daily for 5 days, then 1/2 tablet daily for 2 days    Dispense:  6 tablet    Refill:  0    Return precautions advised.  Clarisa Crooked, MD

## 2023-12-06 NOTE — Patient Instructions (Addendum)
 Lab in room If you have mychart- we will send your results within 3 business days of us  receiving them.  If you do not have mychart- we will call you about results within 5 business days of us  receiving them.  *please also note that you will see labs on mychart as soon as they post. I will later go in and write notes on them- will say "notes from Dr. Arlene Ben"   You do have COVID 19 Prednisone  low dose to try to calm down asthma flared by covid and see if it helps headaches. If you have new or worsening symptoms see us  back as soon as possible   Therefore: - recommended patient watch closely for shortness of breath or confusion or worsening symptoms and if those occur patient should contact us  immediately or seek care in the emergency department -recommended patient consider purchasing pulse oximeter and if levels 94% or below persistently- seek care at the hospital (very unlikely for this to happen without significant underlying respiratory disease) - Isolation guidelines per CDC as of march 2024- "The recommendations suggest returning to normal activities when, for at least 24 hours, symptoms are improving overall, and if a fever was present, it has been gone without use of a fever-reducing medication. Once people resume normal activities, they are encouraged to take additional prevention strategies for the next 5 days to curb disease spread, such as taking more steps for cleaner air, enhancing hygiene practices, wearing a well-fitting mask, keeping a distance from others, and/or getting tested for respiratory viruses." -Patient should inform close contacts about exposure (anyone patient been around unmasked for more than 15 minutes)  - too late for paxlovid   Recommended follow up: Return for next already scheduled visit or sooner if needed.

## 2023-12-07 ENCOUNTER — Encounter: Payer: Self-pay | Admitting: Family Medicine

## 2023-12-07 LAB — COMPREHENSIVE METABOLIC PANEL
ALT: 15 U/L (ref 0–35)
AST: 21 U/L (ref 0–37)
Albumin: 4.4 g/dL (ref 3.5–5.2)
Alkaline Phosphatase: 65 U/L (ref 39–117)
BUN: 21 mg/dL (ref 6–23)
CO2: 28 meq/L (ref 19–32)
Calcium: 9.8 mg/dL (ref 8.4–10.5)
Chloride: 99 meq/L (ref 96–112)
Creatinine, Ser: 0.85 mg/dL (ref 0.40–1.20)
GFR: 66.07 mL/min (ref >60.00)
Glucose, Bld: 120 mg/dL — ABNORMAL HIGH (ref 70–99)
Potassium: 4.4 meq/L (ref 3.5–5.1)
Sodium: 138 meq/L (ref 135–145)
Total Bilirubin: 0.7 mg/dL (ref 0.2–1.2)
Total Protein: 7.5 g/dL (ref 6.0–8.3)

## 2023-12-09 ENCOUNTER — Other Ambulatory Visit: Payer: Self-pay

## 2023-12-09 DIAGNOSIS — I6523 Occlusion and stenosis of bilateral carotid arteries: Secondary | ICD-10-CM

## 2023-12-14 ENCOUNTER — Other Ambulatory Visit: Payer: Self-pay | Admitting: Hematology and Oncology

## 2024-01-04 ENCOUNTER — Ambulatory Visit: Payer: Medicare Other | Admitting: Hematology and Oncology

## 2024-01-04 ENCOUNTER — Telehealth: Payer: Self-pay | Admitting: Hematology and Oncology

## 2024-01-04 NOTE — Telephone Encounter (Signed)
 Received voicemail on 01/04/2024 regarding patient needing to reschedule appointment due to transportation; left patient a voicemail with scheduled appointment times/dates left callback number for patient if needing to change or cancel appointment

## 2024-01-17 ENCOUNTER — Telehealth: Payer: Self-pay

## 2024-01-17 NOTE — Telephone Encounter (Signed)
 Spoke with patient and confirmed appointment on 3/25

## 2024-01-17 NOTE — Progress Notes (Unsigned)
 BRIEF ONCOLOGIC HISTORY:  Oncology History  Ductal carcinoma in situ (DCIS) of right breast  12/18/2021 Cancer Staging   Staging form: Breast, AJCC 8th Edition - Clinical stage from 12/18/2021: Stage 0 (cTis (DCIS), cN0, cM0, ER+, PR+, HER2: Not Assessed) - Signed by Rachel Moulds, MD on 01/07/2022 Stage prefix: Initial diagnosis Nuclear grade: G2 Laterality: Right Stage used in treatment planning: Yes National guidelines used in treatment planning: Yes Type of national guideline used in treatment planning: NCCN   01/05/2022 Initial Diagnosis   Ductal carcinoma in situ (DCIS) of right breast    Genetic Testing   [facility] CancerNext Panel was Negative. Report date is 01/17/2022.  The CancerNext gene panel offered by [employer] includes sequencing, rearrangement analysis, and RNA analysis for the following 36 genes:   APC, ATM, AXIN2, BARD1, BMPR1A, BRCA1, BRCA2, BRIP1, CDH1, CDK4, CDKN2A, CHEK2, DICER1, HOXB13, EPCAM, GREM1, MLH1, MSH2, MSH3, MSH6, MUTYH, NBN, NF1, NTHL1, PALB2, PMS2, POLD1, POLE, PTEN, RAD51C, RAD51D, RECQL, SMAD4, SMARCA4, STK11, and TP53.    01/22/2022 Surgery   She had right breast lumpectomy pathology showed invasive well-differentiated grade 1 ductal carcinoma, extensive intermediate grade DCIS.  Invasive tumor measured 2 mm in greatest dimension, margins free prognostics on the invasive tumor showed ER +85% moderate staining, progesterone receptor negative, KI of less than 5% and HER2 negative. Oncotype not needed since her tumor is less than 5 mm   01/2022 -  Anti-estrogen oral therapy   Anastrozole   03/04/2022 - 04/02/2022 Radiation Therapy   Site Technique Total Dose (Gy) Dose per Fx (Gy) Completed Fx Beam Energies  Breast, Right: Breast_R 3D 40.05/40.05 2.67 15/15 10X, 6XFFF  Breast, Right: Breast_R_Bst 3D 12/12 2 6/6 6X, 10X       INTERVAL HISTORY  Discussed the use of AI scribe software for clinical note transcription with the patient, who gave verbal  consent to proceed.  History of Present Illness    Felicia Perry "Felicia Perry" is a 78 year old female with history of breast cancer and osteopenia who presents for evaluation of bone strengthening treatment options.  She was informed by a staff member about the need for an infusion related to bone health but lacks specific details. She recalls a previous discussion about bone strengthening agents and the requirement for dental clearance due to potential side effects such as osteonecrosis of the jaw. She has not yet visited a dentist for this clearance.  She is currently taking anastrozole for breast cancer, which she tolerates well without any issues. She had a mammogram in January, which was normal, and she is scheduled to return in a year. She experiences occasional shooting pain, which she attributes to post-surgical effects, but otherwise reports no problems.  She reports a new onset of itching and rash behind her ears, which started about a week ago. She has tried using hydrocortisone cream without relief. No recent changes in products or detergents that could have triggered the rash.  She takes vitamin D once a week in a large dose and consumes dairy products regularly, providing her with dietary calcium. She is not on any calcium supplements. 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   Rest of the pertinent 10 point ROS reviewed and negative  REVIEW OF SYSTEMS:  Review of Systems  Constitutional:  Negative for appetite change, chills, fatigue, fever and unexpected weight change.  HENT:   Negative for hearing loss, lump/mass and trouble swallowing.   Eyes:  Negative for eye problems and icterus.  Respiratory:  Negative for chest tightness, cough and shortness of breath.   Cardiovascular:  Negative for chest pain, leg swelling and palpitations.  Gastrointestinal:  Negative for abdominal distention, abdominal pain, constipation,  diarrhea, nausea and vomiting.  Endocrine: Negative for hot flashes.  Genitourinary:  Negative for difficulty urinating.   Musculoskeletal:  Negative for arthralgias.  Skin:  Negative for itching and rash.  Neurological:  Negative for dizziness, extremity weakness, headaches and numbness.  Hematological:  Negative for adenopathy. Does not bruise/bleed easily.  Psychiatric/Behavioral:  Negative for depression. The patient is not nervous/anxious.    Breast: Denies any new nodularity, masses, tenderness, nipple changes, or nipple discharge.   ONCOLOGY TREATMENT TEAM:  1. Surgeon:  Dr. Luisa Hart at [facility] 2. Medical Oncologist: Dr. Al Pimple  3. Radiation Oncologist: Dr. Roselind Messier    PAST MEDICAL/SURGICAL HISTORY:  Past Medical History:  Diagnosis Date   Adenomatous polyp 11/23/2005   Anxiety    01/09/22- patient denies anxiety   Asthma 2003   Basal cell carcinoma 2021   right cheek per patient at [place] [facility]   Carotid stenosis    Chronic diastolic CHF (congestive heart failure) (HCC) 2017   Echo 2021 was normal   COPD (chronic obstructive pulmonary disease) (HCC)    Coronary artery disease 2017   a. HC on 08/14/16 showed RCA CTO s/p PCI, 60% LCX disease managed medically with recs to consider staged PCI if continued symptoms.    Depression 2008   pt reports MD diagnosed but she does not feel depressed   Diabetes mellitus without complication (HCC)    DIVERTICULOSIS, COLON 04/22/2007        GERD (gastroesophageal reflux disease) 2012   History of radiation therapy    Right breast- 03/04/22-04/02/22- Dr. Antony Blackbird   History of shingles    Hypertension 2008   Hypertensive heart disease    Insomnia 2003   Internal hemorrhoids    Osteoarthritis 2008   "knees, hands, lower back" (08/14/2016)   Pap smear for cervical cancer screening 2002   results normal   Personal history of radiation therapy    PUD (peptic ulcer disease) 1989   Seasonal allergies 2013    Sleep apnea    a. resolved with weight loss   Past Surgical History:  Procedure Laterality Date   BREAST BIOPSY Right 12/30/2021   BREAST EXCISIONAL BIOPSY Left    1980's x 3 - all benign   BREAST EXCISIONAL BIOPSY Right    1980's x 1 or 2 - all benign   BREAST LUMPECTOMY Right 01/22/2022   BREAST LUMPECTOMY WITH RADIOACTIVE SEED LOCALIZATION Right 01/22/2022   Procedure: RIGHT BREAST LUMPECTOMY WITH RADIOACTIVE SEED LOCALIZATION;  Surgeon: Harriette Bouillon, MD;  Location:  SURGERY CENTER;  Service: General;  Laterality: Right;   BREAST SURGERY Left 1980s   Fibrous Tumors removed    CARDIAC CATHETERIZATION N/A 08/14/2016   Procedure: Left Heart Cath and Coronary Angiography;  Surgeon: Tonny Bollman, MD;  Location: [facility] INVASIVE CV LAB;  Service: Cardiovascular;  Laterality: N/A;   CARDIAC CATHETERIZATION N/A 08/14/2016   Procedure: Coronary Stent Intervention;  Surgeon: Tonny Bollman, MD;  Location: [facility] INVASIVE CV LAB;  Service: Cardiovascular;  Laterality: N/A;   CATARACT EXTRACTION W/ INTRAOCULAR LENS  IMPLANT, BILATERAL Bilateral 05/2009   COLONOSCOPY N/A 2016   Cologuard 2019   CORONARY ANGIOPLASTY     detached retina left eye     may 2019   LEFT HEART CATH  AND CORONARY ANGIOGRAPHY N/A 09/25/2022   Procedure: LEFT HEART CATH AND CORONARY ANGIOGRAPHY;  Surgeon: Kathleene Hazel, MD;  Location: [facility] INVASIVE CV LAB;  Service: Cardiovascular;  Laterality: N/A;   TUBAL LIGATION  1970s     ALLERGIES:  Allergies  Allergen Reactions   Penicillins Anaphylaxis    REACTION: per patient causes rash,hives   Cephalosporins Swelling    REACTION: tongue swelling   Citalopram Hydrobromide Other (See Comments)    psychosis   Rosuvastatin Other (See Comments)    Pt reports causes bilateral lower extremity muscle aches.    Zolpidem Tartrate     REACTION: difficulty with concentration   Clarithromycin Rash    REACTION: blisters in mouth   Levofloxacin Rash    REACTION:  tongue swells     CURRENT MEDICATIONS:  Outpatient Encounter Medications as of 01/18/2024  Medication Sig   acetaminophen (TYLENOL) 325 MG tablet Take 650 mg by mouth every 6 (six) hours as needed for mild pain, moderate pain or headache.   albuterol (VENTOLIN HFA) 108 (90 Base) MCG/ACT inhaler Inhale 2 puffs into the lungs every 6 (six) hours as needed for wheezing or shortness of breath.   ALPRAZolam (XANAX) 1 MG tablet TAKE ONE TABLET BY MOUTH AT BEDTIME AS NEEDED FOR SLEEP -do not drive FOR EIGHT hours AFTER taking   amLODipine (NORVASC) 10 MG tablet Take 1 tablet (10 mg total) by mouth daily.   anastrozole (ARIMIDEX) 1 MG tablet TAKE ONE TABLET DAILY   aspirin EC 81 MG tablet Take 1 tablet (81 mg total) by mouth daily. Swallow whole.   Bempedoic Acid-Ezetimibe (NEXLIZET) 180-10 MG TABS Take 180 mg by mouth daily.   budesonide-formoterol (SYMBICORT) 160-4.5 MCG/ACT inhaler Inhale 2 puffs into the lungs 2 (two) times daily.   carvedilol (COREG) 25 MG tablet Take 1 tablet (25 mg total) by mouth 2 (two) times daily with a meal.   famotidine (PEPCID) 20 MG tablet TAKE ONE TABLET TWICE DAILY   irbesartan (AVAPRO) 300 MG tablet Take 1 tablet (300 mg total) by mouth daily.   Ketoconazole-Hydrocortisone 2-2.5 % CREA Apply twice daily.   levocetirizine (XYZAL) 5 MG tablet TAKE ONE TABLET BY MOUTH EVERY MORNING   metFORMIN (GLUCOPHAGE) 500 MG tablet Take 1 tablet (500 mg total) by mouth 2 (two) times daily with a meal.   omega-3 acid ethyl esters (LOVAZA) 1 g capsule TAKE TWO CAPSULES EVERY DAY   ondansetron (ZOFRAN) 4 MG tablet Take 1 tablet (4 mg total) by mouth every 8 (eight) hours as needed for nausea or vomiting. Take prior to antibiotic.   predniSONE (DELTASONE) 20 MG tablet Take 1 tablet by mouth daily for 5 days, then 1/2 tablet daily for 2 days   spironolactone (ALDACTONE) 25 MG tablet Take 0.5 tablets (12.5 mg total) by mouth daily.   venlafaxine XR (EFFEXOR-XR) 75 MG 24 hr capsule TAKE  ONE CAPSULE DAILY WITH BREAKFAST   Vitamin D, Ergocalciferol, (DRISDOL) 1.25 MG (50000 UNIT) CAPS capsule Take 1 capsule (50,000 Units total) by mouth every 7 (seven) days.   No facility-administered encounter medications on file as of 01/18/2024.     ONCOLOGIC FAMILY HISTORY:  Family History  Problem Relation Age of Onset   Heart disease Mother    Hypertension Mother    Diabetes Mother    Dementia Mother    COPD Father    Dementia Maternal Grandmother    Colon cancer Neg Hx      SOCIAL HISTORY:  Social History  Socioeconomic History   Marital status: Divorced    Spouse name: Not on file   Number of children: Not on file   Years of education: Not on file   Highest education level: Associate degree: occupational, Scientist, product/process development, or vocational program  Occupational History   Occupation: Cleaning  Tobacco Use   Smoking status: Former    Current packs/day: 0.00    Average packs/day: 1 pack/day for 26.0 years (26.0 ttl pk-yrs)    Types: Cigarettes    Start date: 40    Quit date: 1989    Years since quitting: 36.2   Smokeless tobacco: Never  Vaping Use   Vaping status: Never Used  Substance and Sexual Activity   Alcohol use: No   Drug use: No   Sexual activity: Not Currently  Other Topics Concern   Not on file  Social History Narrative   Divorced for 38 years in 2016. 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    Social Drivers of Health   Financial Resource Strain: Medium Risk (11/08/2023)   Overall Financial Resource Strain (CARDIA)    Difficulty of Paying Living Expenses: Somewhat hard  Food Insecurity: No Food Insecurity (11/08/2023)   Hunger Vital  Sign    Worried About Running Out of Food in the Last Year: Never true    Ran Out of Food in the Last Year: Never true  Transportation Needs: No Transportation Needs (11/08/2023)   PRAPARE - Administrator, Civil Service (Medical): No    Lack of Transportation (Non-Medical): No  Physical Activity: Insufficiently Active (11/08/2023)   Exercise Vital Sign    Days of Exercise per Week: 2 days    Minutes of Exercise per Session: 20 min  Stress: Stress Concern Present (11/08/2023)   [name] of Occupational Health - Occupational Stress Questionnaire    Feeling of Stress : To some extent  Social Connections: Moderately Integrated (11/08/2023)   Social Connection and Isolation Panel [NHANES]    Frequency of Communication with Friends and Family: Three times a week    Frequency of Social Gatherings with Friends and Family: Twice a week    Attends Religious Services: More than 4 times per year    Active Member of [employer] or Organizations: Yes    Attends Engineer, structural: More than 4 times per year    Marital Status: Divorced  Intimate Partner Violence: Not At Risk (04/05/2023)   Humiliation, Afraid, Rape, and Kick questionnaire    Fear of Current or Ex-Partner: No    Emotionally Abused: No    Physically Abused: No    Sexually Abused: No     OBSERVATIONS/OBJECTIVE:  There were no vitals taken for this visit.  GENERAL: Patient is a well appearing female in no acute distress Skin: Scattered ecchymosis on forearms Breast: Bilateral breasts inspected.  No palpable masses or regional adenopathy.  LABORATORY DATA:  None for this visit.  DIAGNOSTIC IMAGING:  None for this visit.   ASSESSMENT AND PLAN:   Ms.. Sheets is a pleasant 78 y.o. female with Stage 0 right breast DCIS, ER+/PR+, diagnosed in February 2023, treated  with lumpectomy, adjuvant radiation therapy, and anti-estrogen therapy with anastrozole beginning in October 2023.  Last mammogram in January  2024 with no evidence of recurrence, no evidence of malignancy in either breast. Last bone density scan which showed osteopenia with a T-score of -2.4, very close to being osteoporotic. She is now on adjuvant anastrozole.  Breast cancer On anastrozole, tolerating well. January mammogram normal. - Continue anastrozole therapy. - Schedule annual mammogram in January.  Osteopenia Bone density score -2.4. Discussed dental clearance necessity before bone-strengthening agents due to osteonecrosis risk. - Provide dental clearance form. - Advise scheduling a dental appointment for clearance. - Ensure intake of vitamin D and calcium-rich foods.  Rash behind ears Rash likely due to unknown irritant or allergen. Hydrocortisone ineffective. - Prescribe triamcinolone ointment. - Monitor response to treatment.  Follow-up Routine evaluation and treatment plan adherence check. - Schedule follow-up appointment in six months.  Return to clinic in 6 months or sooner as needed.   Total encounter time:30 minutes*in face-to-face visit time, chart review, lab review, care coordination, order entry, and documentation of the encounter time.  *Total Encounter Time as defined by the Centers for Medicare and Medicaid Services includes, in addition to the face-to-face time of a patient visit (documented in the note above) non-face-to-face time: obtaining and reviewing outside history, ordering and reviewing medications, tests or procedures, care coordination (communications with other health care professionals or caregivers) and documentation in the medical record.

## 2024-01-18 ENCOUNTER — Inpatient Hospital Stay: Attending: Hematology and Oncology | Admitting: Hematology and Oncology

## 2024-01-18 VITALS — BP 134/57 | HR 66 | Temp 97.8°F | Resp 17 | Wt 166.5 lb

## 2024-01-18 DIAGNOSIS — Z79811 Long term (current) use of aromatase inhibitors: Secondary | ICD-10-CM | POA: Insufficient documentation

## 2024-01-18 DIAGNOSIS — Z85828 Personal history of other malignant neoplasm of skin: Secondary | ICD-10-CM | POA: Insufficient documentation

## 2024-01-18 DIAGNOSIS — I110 Hypertensive heart disease with heart failure: Secondary | ICD-10-CM | POA: Insufficient documentation

## 2024-01-18 DIAGNOSIS — M8580 Other specified disorders of bone density and structure, unspecified site: Secondary | ICD-10-CM | POA: Insufficient documentation

## 2024-01-18 DIAGNOSIS — Z87891 Personal history of nicotine dependence: Secondary | ICD-10-CM | POA: Diagnosis not present

## 2024-01-18 DIAGNOSIS — I5032 Chronic diastolic (congestive) heart failure: Secondary | ICD-10-CM | POA: Insufficient documentation

## 2024-01-18 DIAGNOSIS — D0511 Intraductal carcinoma in situ of right breast: Secondary | ICD-10-CM | POA: Diagnosis not present

## 2024-01-18 DIAGNOSIS — Z923 Personal history of irradiation: Secondary | ICD-10-CM | POA: Diagnosis not present

## 2024-01-18 DIAGNOSIS — Z79899 Other long term (current) drug therapy: Secondary | ICD-10-CM | POA: Insufficient documentation

## 2024-01-18 DIAGNOSIS — R21 Rash and other nonspecific skin eruption: Secondary | ICD-10-CM | POA: Diagnosis not present

## 2024-01-18 MED ORDER — TRIAMCINOLONE ACETONIDE 0.025 % EX OINT: 1.0000 | TOPICAL_OINTMENT | Freq: Two times a day (BID) | CUTANEOUS | 0 refills | Status: DC

## 2024-01-25 ENCOUNTER — Other Ambulatory Visit: Payer: Self-pay

## 2024-01-25 ENCOUNTER — Telehealth: Payer: Self-pay | Admitting: Cardiology

## 2024-01-25 MED ORDER — NEXLIZET 180-10 MG PO TABS: ORAL_TABLET | ORAL | 2 refills | Status: DC

## 2024-01-25 NOTE — Telephone Encounter (Signed)
 Will route this message to our PharmD team and Haze Rushing, for further management and assistance with pts Nexlizet refill/grant request.

## 2024-01-25 NOTE — Telephone Encounter (Signed)
 Pt c/o medication issue:  1. Name of Medication: Bempedoic Acid-Ezetimibe (NEXLIZET) 180-10 MG TABS   2. How are you currently taking this medication (dosage and times per day)? TAKE ONE CAPSULE DAILY WITH BREAKFAST   3. Are you having a reaction (difficulty breathing--STAT)? No  4. What is your medication issue? Patient is needing a refill, but she gets the prescription from a grant. Patient is requesting to speak with our pharmacist Megan in regard to this. Patient stated she has enough medication to last for two more days. Please advise.

## 2024-01-26 ENCOUNTER — Other Ambulatory Visit (HOSPITAL_COMMUNITY): Payer: Self-pay

## 2024-01-26 ENCOUNTER — Telehealth: Payer: Self-pay

## 2024-01-26 NOTE — Telephone Encounter (Signed)
 Patient Advocate Encounter   The patient was approved for a [employer] grant that will help cover the cost of NEXLIZET Total amount awarded, $2,500.  Effective: 12/27/23 - 12/25/24   [id number]   Pharmacy provided with approval and processing information. Patient informed via Dorcas Carrow, CPhT  Pharmacy Patient Advocate Specialist  Direct Number: [phone] Fax: [phone]

## 2024-01-26 NOTE — Telephone Encounter (Signed)
 Kennedy Bucker renewed, see separate encounter

## 2024-02-10 NOTE — Progress Notes (Deleted)
 [facility] [address] [place] [address] Phone: [phone] Subjective:    I'm seeing this patient by the request  of:  Almira Jaeger, MD  CC:   [id number]  Felicia Perry is a 78 y.o. female coming in with complaint of back and neck pain. OMT 11/16/2023. Patient states   Medications patient has been prescribed: Effexor  Taking:         Reviewed prior external information including notes and imaging from previsou exam, outside providers and external EMR if available.   As well as notes that were available from care everywhere and other healthcare systems.  Past medical history, social, surgical and family history all reviewed in electronic medical record.  No pertanent information unless stated regarding to the chief complaint.   Past Medical History:  Diagnosis Date   Adenomatous polyp 11/23/2005   Anxiety    01/09/22- patient denies anxiety   Asthma 2003   Basal cell carcinoma 2021   right cheek per patient at [place] [facility]   Carotid stenosis    Chronic diastolic CHF (congestive heart failure) (HCC) 2017   Echo 2021 was normal   COPD (chronic obstructive pulmonary disease) (HCC)    Coronary artery disease 2017   a. HC on 08/14/16 showed RCA CTO s/p PCI, 60% LCX disease managed medically with recs to consider staged PCI if continued symptoms.    Depression 2008   pt reports MD diagnosed but she does not feel depressed   Diabetes mellitus without complication (HCC)    DIVERTICULOSIS, COLON 04/22/2007        GERD (gastroesophageal reflux disease) 2012   History of radiation therapy    Right breast- 03/04/22-04/02/22- Dr. Retta Caster   History of shingles    Hypertension 2008   Hypertensive heart disease    Insomnia 2003   Internal hemorrhoids    Osteoarthritis 2008   "knees, hands, lower back" (08/14/2016)   Pap smear for cervical cancer screening 2002   results normal   Personal history of radiation  therapy    PUD (peptic ulcer disease) 1989   Seasonal allergies 2013   Sleep apnea    a. resolved with weight loss    Allergies  Allergen Reactions   Penicillins Anaphylaxis    REACTION: per patient causes rash,hives   Cephalosporins Swelling    REACTION: tongue swelling   Citalopram Hydrobromide Other (See Comments)    psychosis   Rosuvastatin Other (See Comments)    Pt reports causes bilateral lower extremity muscle aches.    Zolpidem Tartrate     REACTION: difficulty with concentration   Clarithromycin Rash    REACTION: blisters in mouth   Levofloxacin Rash    REACTION: tongue swells     Review of Systems:  No headache, visual changes, nausea, vomiting, diarrhea, constipation, dizziness, abdominal pain, skin rash, fevers, chills, night sweats, weight loss, swollen lymph nodes, body aches, joint swelling, chest pain, shortness of breath, mood changes. POSITIVE muscle aches  Objective  There were no vitals taken for this visit.   General: No apparent distress alert and oriented x3 mood and affect normal, dressed appropriately.  HEENT: Pupils equal, extraocular movements intact  Respiratory: Patient's speak in full sentences and does not appear short of breath  Cardiovascular: No lower extremity edema, non tender, no erythema  Gait MSK:  Back   Osteopathic findings  C2 flexed rotated and side bent right C6 flexed rotated and side bent left T3 extended rotated  and side bent right inhaled rib T9 extended rotated and side bent left L2 flexed rotated and side bent right Sacrum right on right       Assessment and Plan:  No problem-specific Assessment & Plan notes found for this encounter.    Nonallopathic problems  Decision today to treat with OMT was based on Physical Exam  After verbal consent patient was treated with HVLA, ME, FPR techniques in cervical, rib, thoracic, lumbar, and sacral  areas  Patient tolerated the procedure well with improvement in  symptoms  Patient given exercises, stretches and lifestyle modifications  See medications in patient instructions if given  Patient will follow up in 4-8 weeks             Note: This dictation was prepared with Dragon dictation along with smaller phrase technology. Any transcriptional errors that result from this process are unintentional.

## 2024-02-14 ENCOUNTER — Ambulatory Visit: Payer: Medicare Other | Admitting: Family Medicine

## 2024-02-15 ENCOUNTER — Ambulatory Visit: Payer: Medicare Other | Admitting: Family Medicine

## 2024-02-21 ENCOUNTER — Ambulatory Visit: Admitting: Family Medicine

## 2024-02-25 ENCOUNTER — Encounter: Payer: Self-pay | Admitting: Family Medicine

## 2024-02-25 ENCOUNTER — Ambulatory Visit (INDEPENDENT_AMBULATORY_CARE_PROVIDER_SITE_OTHER): Admitting: Family Medicine

## 2024-02-25 VITALS — BP 124/62 | HR 71 | Temp 97.2°F | Ht 60.0 in | Wt 165.6 lb

## 2024-02-25 DIAGNOSIS — I10 Essential (primary) hypertension: Secondary | ICD-10-CM

## 2024-02-25 DIAGNOSIS — Z7984 Long term (current) use of oral hypoglycemic drugs: Secondary | ICD-10-CM

## 2024-02-25 DIAGNOSIS — E118 Type 2 diabetes mellitus with unspecified complications: Secondary | ICD-10-CM | POA: Diagnosis not present

## 2024-02-25 DIAGNOSIS — G720 Drug-induced myopathy: Secondary | ICD-10-CM

## 2024-02-25 DIAGNOSIS — E1169 Type 2 diabetes mellitus with other specified complication: Secondary | ICD-10-CM | POA: Diagnosis not present

## 2024-02-25 DIAGNOSIS — I2510 Atherosclerotic heart disease of native coronary artery without angina pectoris: Secondary | ICD-10-CM

## 2024-02-25 DIAGNOSIS — E785 Hyperlipidemia, unspecified: Secondary | ICD-10-CM

## 2024-02-25 LAB — HEMOGLOBIN A1C: Hgb A1c MFr Bld: 7.5 % — ABNORMAL HIGH (ref 4.6–6.5)

## 2024-02-25 LAB — COMPREHENSIVE METABOLIC PANEL WITH GFR
ALT: 28 U/L (ref 0–35)
AST: 34 U/L (ref 0–37)
Albumin: 4.7 g/dL (ref 3.5–5.2)
Alkaline Phosphatase: 73 U/L (ref 39–117)
BUN: 24 mg/dL — ABNORMAL HIGH (ref 6–23)
CO2: 27 meq/L (ref 19–32)
Calcium: 10.3 mg/dL (ref 8.4–10.5)
Chloride: 98 meq/L (ref 96–112)
Creatinine, Ser: 0.93 mg/dL (ref 0.40–1.20)
GFR: 59.22 mL/min — ABNORMAL LOW (ref >60.00)
Glucose, Bld: 141 mg/dL — ABNORMAL HIGH (ref 70–99)
Potassium: 4.6 meq/L (ref 3.5–5.1)
Sodium: 136 meq/L (ref 135–145)
Total Bilirubin: 0.4 mg/dL (ref 0.2–1.2)
Total Protein: 7.8 g/dL (ref 6.0–8.3)

## 2024-02-25 NOTE — Progress Notes (Signed)
 Phone [phone] In person visit   Subjective:   Felicia Perry is a 78 y.o. year old very pleasant female patient who presents for/with See problem oriented charting Chief Complaint  Patient presents with   Medical Management of Chronic Issues   Diabetes   Hyperlipidemia   Past Medical History-  Patient Active Problem List   Diagnosis Date Noted   Ductal carcinoma in situ (DCIS) of right breast 01/05/2022    Priority: High   Diabetes mellitus type 2 with complications (HCC) 02/05/2021    Priority: High   Carotid stenosis 01/23/2020    Priority: High   Chronic diastolic CHF (congestive heart failure) (HCC) 01/12/2019    Priority: High   Coronary artery disease involving native coronary artery of native heart without angina pectoris     Priority: High   Syncope 08/04/2016    Priority: High   statin myalgia 06/25/2020    Priority: Medium    Hyperlipidemia associated with type 2 diabetes mellitus (HCC) 07/11/2018    Priority: Medium    H/O hyperparathyroidism 08/18/2016    Priority: Medium    Vitamin D  deficiency 08/20/2015    Priority: Medium    Chronic pain syndrome 03/26/2015    Priority: Medium    Insomnia 07/13/2014    Priority: Medium    GERD (gastroesophageal reflux disease) 02/25/2011    Priority: Medium    Depression 04/22/2007    Priority: Medium    Essential hypertension 04/22/2007    Priority: Medium    Asthma 04/22/2007    Priority: Medium    Greater trochanteric bursitis of left hip 08/13/2020    Priority: Low   Senile purpura (HCC) 08/07/2020    Priority: Low   Degenerative arthritis of knee, bilateral 12/08/2018    Priority: Low   Partial hamstring tear, initial encounter 11/16/2018    Priority: Low   Nonallopathic lesion of sacral region 07/05/2018    Priority: Low   Nonallopathic lesion of cervical region 07/05/2018    Priority: Low   DDD (degenerative disc disease), cervical 05/04/2018    Priority: Low   Degenerative arthritis of left  knee 09/15/2017    Priority: Low   PUD (peptic ulcer disease)     Priority: Low   Hyperglycemia 08/06/2015    Priority: Low   Xerosis of skin 08/06/2015    Priority: Low   Hypersomnia with sleep apnea 03/26/2015    Priority: Low   Nonallopathic lesion of lumbosacral region 11/13/2014    Priority: Low   Osteoarthritis of left lower extremity 10/17/2014    Priority: Low   Chronic meniscal tear of knee 10/17/2014    Priority: Low   Nonallopathic lesion of thoracic region 09/14/2014    Priority: Low   Nonallopathic lesion-rib cage 08/21/2014    Priority: Low   Tendinopathy of right rotator cuff 07/30/2014    Priority: Low   Piriformis syndrome of right side 07/30/2014    Priority: Low   Limited joint range of motion 07/13/2014    Priority: Low   Rash 07/13/2014    Priority: Low   SI (sacroiliac) joint dysfunction 10/10/2007    Priority: Low   LOW BACK PAIN 07/01/2007    Priority: Low   Shortness of breath 09/25/2022   Genetic testing 01/19/2022   Intractable headache 01/02/2021    Medications- reviewed and updated Current Outpatient Medications  Medication Sig Dispense Refill   acetaminophen  (TYLENOL ) 325 MG tablet Take 650 mg by mouth every 6 (six) hours as needed for mild  pain, moderate pain or headache.     albuterol  (VENTOLIN  HFA) 108 (90 Base) MCG/ACT inhaler Inhale 2 puffs into the lungs every 6 (six) hours as needed for wheezing or shortness of breath. 18 g 5   ALPRAZolam  (XANAX ) 1 MG tablet TAKE ONE TABLET BY MOUTH AT BEDTIME AS NEEDED FOR SLEEP -do not drive FOR EIGHT hours AFTER taking 90 tablet 1   amLODipine  (NORVASC ) 10 MG tablet Take 1 tablet (10 mg total) by mouth daily. 90 tablet 2   anastrozole  (ARIMIDEX ) 1 MG tablet TAKE ONE TABLET DAILY 30 tablet 3   aspirin  EC 81 MG tablet Take 1 tablet (81 mg total) by mouth daily. Swallow whole. 90 tablet 3   Bempedoic Acid -Ezetimibe  (NEXLIZET ) 180-10 MG TABS Take 180 mg by mouth daily. 90 tablet 2    budesonide -formoterol  (SYMBICORT ) 160-4.5 MCG/ACT inhaler Inhale 2 puffs into the lungs 2 (two) times daily. 1 each 11   carvedilol  (COREG ) 25 MG tablet Take 1 tablet (25 mg total) by mouth 2 (two) times daily with a meal. 180 tablet 1   famotidine  (PEPCID ) 20 MG tablet TAKE ONE TABLET TWICE DAILY 60 tablet 5   irbesartan  (AVAPRO ) 300 MG tablet Take 1 tablet (300 mg total) by mouth daily. 90 tablet 3   Ketoconazole -Hydrocortisone  2-2.5 % CREA Apply twice daily. 30 g 0   levocetirizine (XYZAL ) 5 MG tablet TAKE ONE TABLET BY MOUTH EVERY MORNING 90 tablet 3   metFORMIN  (GLUCOPHAGE ) 500 MG tablet Take 1 tablet (500 mg total) by mouth 2 (two) times daily with a meal. 180 tablet 3   omega-3 acid ethyl esters (LOVAZA ) 1 g capsule TAKE TWO CAPSULES EVERY DAY 180 capsule 3   ondansetron  (ZOFRAN ) 4 MG tablet Take 1 tablet (4 mg total) by mouth every 8 (eight) hours as needed for nausea or vomiting. Take prior to antibiotic. 20 tablet 0   spironolactone  (ALDACTONE ) 25 MG tablet Take 0.5 tablets (12.5 mg total) by mouth daily. 45 tablet 1   triamcinolone  (KENALOG ) 0.025 % ointment Apply 1 Application topically 2 (two) times daily. 30 g 0   venlafaxine  XR (EFFEXOR -XR) 75 MG 24 hr capsule TAKE ONE CAPSULE DAILY WITH BREAKFAST 90 capsule 2   Vitamin D , Ergocalciferol , (DRISDOL ) 1.25 MG (50000 UNIT) CAPS capsule Take 1 capsule (50,000 Units total) by mouth every 7 (seven) days. 13 capsule 1   No current facility-administered medications for this visit.     Objective:  BP 124/62   Pulse 71   Temp (!) 97.2 F (36.2 C)   Ht 5' (1.524 m)   Wt 165 lb 9.6 oz (75.1 kg)   SpO2 96%   BMI 32.34 kg/m  Gen: NAD, resting comfortably CV: RRR no murmurs rubs or gallops Lungs: CTAB no crackles, wheeze, rhonchi Ext: no edema Skin: warm, dry     Assessment and Plan   #COVID in February- had a lot of fatigue including lingering. Is only doing one house now and feels needs to get out and do more to help with  deconditioning.   #Right breast DCIS on biopsy 12/31/2021-she has had a lumpectomy and has completed adjuvant radiation on 04/02/2022.  She is taking anastrozole  as well and plans for 43-month follow-up  with Dr. Arno Bibles- had this recently  # Diabetes-diagnosed 08/07/2020 after having second A1c 6.5 or above S: Medication: Metformin  500 mg twice daily started at last visit  Exercise and diet- has tried to improve diet with more fruits/veggies Lab Results  Component Value Date  HGBA1C 8.1 (H) 11/23/2023   HGBA1C 6.5 05/13/2023   HGBA1C 6.8 (H) 11/12/2022  A/P: hopefully improved- update a1c today. Continue current meds for now - could increase metformin  if needed but hopefully better -shed like to come off metformin - discussed if 5.9 or below to reduce to once daily  #hypertension #Chronic diastolic CHF S: medication: Amlodipine  10 mg, irbesartan  300 mg, coreg  25 mg twice daily,  spironolactone  25 mg - blood pressure also looks good at home - no recent weight gain or swelling BP Readings from Last 3 Encounters:  02/25/24 124/62  01/18/24 (!) 134/57  12/06/23 126/68  A/P: hypertension stable- continue current medicines  CHF euvolemic - continue current medications    #CAD  #hyperlipidemia with statin myalgia-lpa not elevated S: Medication: nexlizet  (bempedoic acid -ezetimibe ), Lovaza , aspirin  81 mg -History of syncope related to ischemia -Still struggles with elevated triglycerides on this regimen -ongoing shortness of breath - but no worse and inhaler helps- asthma likely contributes (on Symbicort ). No chest pain  Lab Results  Component Value Date   CHOL 126 11/23/2023   HDL 30.30 (L) 11/23/2023   LDLCALC 48 08/07/2020   LDLDIRECT 53.0 11/23/2023   TRIG (H) 11/23/2023    495.0 Triglyceride is over 400; calculations on Lipids are invalid.   CHOLHDL 4 11/23/2023   A/P: cholesterol as far as LDL looks good but triglyceride(s) high- this also could have been form high a1c so hopefully  improving- perhaps recheck next visit  #Depression-medication also helps with chronic pain/insomnia S: Medication: Effexor  75 mg extended release alprazolam  1 mg as needed before bed A/P: reports overall stable- continue current medications     #GERD S: Medication: Pepcid  20 mg twice daily as needed  A/P: doing well just as needed- better with healthier diet   #Vitamin D  deficiency S: Medication: high dose d A/P: likely recheck next visit   Recommended follow up: Return in about 4 months (around 06/27/2024) for followup or sooner if needed.Schedule b4 you leave. Future Appointments  Date Time Provider Department Center  02/25/2024  9:20 AM Almira Jaeger, MD [facility]  03/24/2024  9:15 AM Isidro Margo, DO [facility] None  04/04/2024  9:20 AM Cody Das, MD [facility] H&V  04/12/2024  3:40 PM [facility] ANNUAL WELLNESS VISIT 1 [facility]  05/23/2024  2:00 PM [facility]  05/23/2024  2:40 PM Carlene Che, MD [facility] H[facility]V  05/29/2024 10:00 AM Almira Jaeger, MD [facility]  07/21/2024 11:30 AM Murleen Arms, MD [facility] None    Lab/Order associations:   ICD-10-CM   1. Diabetes mellitus type 2 with complications (HCC)  E11.8 Hemoglobin A1c    Comprehensive metabolic panel with GFR    2. Essential hypertension  I10     3. Coronary artery disease involving native coronary artery of native heart without angina pectoris  I25.10     4. Hyperlipidemia associated with type 2 diabetes mellitus (HCC)  E11.69    E78.5     5. Drug-induced myopathy  G72.0       No orders of the defined types were placed in this encounter.   Return precautions advised.  Clarisa Crooked, MD

## 2024-02-25 NOTE — Patient Instructions (Addendum)
 Tetanus, Diphtheria, and Pertussis (Tdap) at pharmacy- consider as last one done march 2015  Please stop by lab before you go If you have mychart- we will send your results within 3 business days of us  receiving them.  If you do not have mychart- we will call you about results within 5 business days of us  receiving them.  *please also note that you will see labs on mychart as soon as they post. I will later go in and write notes on them- will say "notes from Dr. Arlene Ben"   Recommended follow up: Return in about 4 months (around 06/27/2024) for followup or sooner if needed.Schedule b4 you leave. Could also schedule physical year out from last one

## 2024-02-28 ENCOUNTER — Other Ambulatory Visit: Payer: Self-pay

## 2024-02-28 MED ORDER — METFORMIN HCL 1000 MG PO TABS: 1000.0000 mg | ORAL_TABLET | Freq: Two times a day (BID) | ORAL | 3 refills | Status: DC

## 2024-02-28 NOTE — Telephone Encounter (Signed)
 Patient returned call. Requests to be called asap.

## 2024-02-28 NOTE — Telephone Encounter (Signed)
 Called and spoke with pt and new rx sent to pharmacy.

## 2024-03-23 NOTE — Progress Notes (Deleted)
 [facility] [address] [place] [address] Phone: [phone] Subjective:    I'm seeing this patient by the request  of:  Almira Jaeger, MD  CC:   [id number]  Felicia Perry is a 78 y.o. female coming in with complaint of back and neck pain. OMT 11/20/2023. Patient states   Medications patient has been prescribed: None  Taking:         Reviewed prior external information including notes and imaging from previsou exam, outside providers and external EMR if available.   As well as notes that were available from care everywhere and other healthcare systems.  Past medical history, social, surgical and family history all reviewed in electronic medical record.  No pertanent information unless stated regarding to the chief complaint.   Past Medical History:  Diagnosis Date   Adenomatous polyp 11/23/2005   Anxiety    01/09/22- patient denies anxiety   Asthma 2003   Basal cell carcinoma 2021   right cheek per patient at [facility] [facility]   Carotid stenosis    Chronic diastolic CHF (congestive heart failure) (HCC) 2017   Echo 2021 was normal   COPD (chronic obstructive pulmonary disease) (HCC)    Coronary artery disease 2017   a. HC on 08/14/16 showed RCA CTO s/p PCI, 60% LCX disease managed medically with recs to consider staged PCI if continued symptoms.    Depression 2008   pt reports MD diagnosed but she does not feel depressed   Diabetes mellitus without complication (HCC)    DIVERTICULOSIS, COLON 04/22/2007        GERD (gastroesophageal reflux disease) 2012   History of radiation therapy    Right breast- 03/04/22-04/02/22- Dr. Retta Caster   History of shingles    Hypertension 2008   Hypertensive heart disease    Insomnia 2003   Internal hemorrhoids    Osteoarthritis 2008   "knees, hands, lower back" (08/14/2016)   Pap smear for cervical cancer screening 2002   results normal   Personal history of radiation therapy     PUD (peptic ulcer disease) 1989   Seasonal allergies 2013   Sleep apnea    a. resolved with weight loss    Allergies  Allergen Reactions   Penicillins Anaphylaxis    REACTION: per patient causes rash,hives   Cephalosporins Swelling    REACTION: tongue swelling   Citalopram  Hydrobromide Other (See Comments)    psychosis   Rosuvastatin  Other (See Comments)    Pt reports causes bilateral lower extremity muscle aches.    Zolpidem Tartrate     REACTION: difficulty with concentration   Clarithromycin Rash    REACTION: blisters in mouth   Levofloxacin Rash    REACTION: tongue swells     Review of Systems:  No headache, visual changes, nausea, vomiting, diarrhea, constipation, dizziness, abdominal pain, skin rash, fevers, chills, night sweats, weight loss, swollen lymph nodes, body aches, joint swelling, chest pain, shortness of breath, mood changes. POSITIVE muscle aches  Objective  There were no vitals taken for this visit.   General: No apparent distress alert and oriented x3 mood and affect normal, dressed appropriately.  HEENT: Pupils equal, extraocular movements intact  Respiratory: Patient's speak in full sentences and does not appear short of breath  Cardiovascular: No lower extremity edema, non tender, no erythema  Gait MSK:  Back   Osteopathic findings  C2 flexed rotated and side bent right C6 flexed rotated and side bent left T3 extended rotated  and side bent right inhaled rib T9 extended rotated and side bent left L2 flexed rotated and side bent right Sacrum right on right       Assessment and Plan:  No problem-specific Assessment & Plan notes found for this encounter.    Nonallopathic problems  Decision today to treat with OMT was based on Physical Exam  After verbal consent patient was treated with HVLA, ME, FPR techniques in cervical, rib, thoracic, lumbar, and sacral  areas  Patient tolerated the procedure well with improvement in  symptoms  Patient given exercises, stretches and lifestyle modifications  See medications in patient instructions if given  Patient will follow up in 4-8 weeks             Note: This dictation was prepared with Dragon dictation along with smaller phrase technology. Any transcriptional errors that result from this process are unintentional.

## 2024-03-24 ENCOUNTER — Ambulatory Visit: Admitting: Family Medicine

## 2024-03-27 ENCOUNTER — Encounter: Payer: Self-pay | Admitting: Family Medicine

## 2024-03-27 ENCOUNTER — Ambulatory Visit (INDEPENDENT_AMBULATORY_CARE_PROVIDER_SITE_OTHER): Admitting: Family Medicine

## 2024-03-27 VITALS — BP 122/70 | HR 84 | Ht 60.0 in | Wt 165.4 lb

## 2024-03-27 DIAGNOSIS — M9903 Segmental and somatic dysfunction of lumbar region: Secondary | ICD-10-CM | POA: Diagnosis not present

## 2024-03-27 DIAGNOSIS — M9908 Segmental and somatic dysfunction of rib cage: Secondary | ICD-10-CM | POA: Diagnosis not present

## 2024-03-27 DIAGNOSIS — M9904 Segmental and somatic dysfunction of sacral region: Secondary | ICD-10-CM | POA: Diagnosis not present

## 2024-03-27 DIAGNOSIS — M9902 Segmental and somatic dysfunction of thoracic region: Secondary | ICD-10-CM | POA: Diagnosis not present

## 2024-03-27 DIAGNOSIS — M5030 Other cervical disc degeneration, unspecified cervical region: Secondary | ICD-10-CM

## 2024-03-27 DIAGNOSIS — M9901 Segmental and somatic dysfunction of cervical region: Secondary | ICD-10-CM

## 2024-03-27 NOTE — Assessment & Plan Note (Signed)
 Known arthritic changes, continue to be active, patient is still going to work on the weight loss.  Discussed icing regimen of home exercises, discussed which activities to do and which ones to avoid.  Increase activity slowly, discussed icing regimen.  Follow-up again in 6 to 8 weeks otherwise.

## 2024-03-27 NOTE — Progress Notes (Signed)
 [facility] [address] [place] [address] Phone: [phone] Subjective:   Felicia Perry am a scribe for Dr. Felipe Horton.   I'm seeing this patient by the request  of:  Almira Jaeger, MD  CC: Back and neck pain follow-up  [id number]  Felicia Perry is a 78 y.o. female coming in with complaint of back and neck pain. OMT 11/20/2023. Patient states back is fine. Neck is giving her a fit.  Has had some more tightness, is doing more crafts that she thinks may be contributing.  Medications patient has been prescribed: None  Taking:         Reviewed prior external information including notes and imaging from previsou exam, outside providers and external EMR if available.   As well as notes that were available from care everywhere and other healthcare systems.  Past medical history, social, surgical and family history all reviewed in electronic medical record.  No pertanent information unless stated regarding to the chief complaint.   Past Medical History:  Diagnosis Date   Adenomatous polyp 11/23/2005   Anxiety    01/09/22- patient denies anxiety   Asthma 2003   Basal cell carcinoma 2021   right cheek per patient at [facility] [facility]   Carotid stenosis    Chronic diastolic CHF (congestive heart failure) (HCC) 2017   Echo 2021 was normal   COPD (chronic obstructive pulmonary disease) (HCC)    Coronary artery disease 2017   a. HC on 08/14/16 showed RCA CTO s/p PCI, 60% LCX disease managed medically with recs to consider staged PCI if continued symptoms.    Depression 2008   pt reports MD diagnosed but she does not feel depressed   Diabetes mellitus without complication (HCC)    DIVERTICULOSIS, COLON 04/22/2007        GERD (gastroesophageal reflux disease) 2012   History of radiation therapy    Right breast- 03/04/22-04/02/22- Dr. Retta Caster   History of shingles    Hypertension 2008   Hypertensive heart disease    Insomnia  2003   Internal hemorrhoids    Osteoarthritis 2008   "knees, hands, lower back" (08/14/2016)   Pap smear for cervical cancer screening 2002   results normal   Personal history of radiation therapy    PUD (peptic ulcer disease) 1989   Seasonal allergies 2013   Sleep apnea    a. resolved with weight loss    Allergies  Allergen Reactions   Penicillins Anaphylaxis    REACTION: per patient causes rash,hives   Cephalosporins Swelling    REACTION: tongue swelling   Citalopram  Hydrobromide Other (See Comments)    psychosis   Rosuvastatin  Other (See Comments)    Pt reports causes bilateral lower extremity muscle aches.    Zolpidem Tartrate     REACTION: difficulty with concentration   Clarithromycin Rash    REACTION: blisters in mouth   Levofloxacin Rash    REACTION: tongue swells     Review of Systems:  No headache, visual changes, nausea, vomiting, diarrhea, constipation, dizziness, abdominal pain, skin rash, fevers, chills, night sweats, weight loss, swollen lymph nodes, body aches, joint swelling, chest pain, shortness of breath, mood changes. POSITIVE muscle aches  Objective  Blood pressure 122/70, pulse 84, height 5' (1.524 m), weight 165 lb 6.4 oz (75 kg), SpO2 98%.   General: No apparent distress alert and oriented x3 mood and affect normal, dressed appropriately.  HEENT: Pupils equal, extraocular movements intact  Respiratory: Patient's  speak in full sentences and does not appear short of breath  Cardiovascular: No lower extremity edema, non tender, no erythema  Gait MSK:  Back   Osteopathic findings  C2 flexed rotated and side bent right C6 flexed rotated and side bent left T5 extended rotated and side bent left inhaled rib L1 flexed rotated and side bent right Sacrum right on right       Assessment and Plan:  DDD (degenerative disc disease), cervical Known arthritic changes, continue to be active, patient is still going to work on the weight loss.   Discussed icing regimen of home exercises, discussed which activities to do and which ones to avoid.  Increase activity slowly, discussed icing regimen.  Follow-up again in 6 to 8 weeks otherwise.    Nonallopathic problems  Decision today to treat with OMT was based on Physical Exam  After verbal consent patient was treated with HVLA, ME, FPR techniques in cervical, rib, thoracic, lumbar, and sacral  areas  Patient tolerated the procedure well with improvement in symptoms  Patient given exercises, stretches and lifestyle modifications  See medications in patient instructions if given  Patient will follow up in 4-8 weeks     The above documentation has been reviewed and is accurate and complete Japhet Morgenthaler M Mireyah Chervenak, DO         Note: This dictation was prepared with Dragon dictation along with smaller phrase technology. Any transcriptional errors that result from this process are unintentional.

## 2024-03-27 NOTE — Patient Instructions (Signed)
 Good to see you. Try to make your table higher with a rotating seat. See you in 8 weeks.

## 2024-04-04 ENCOUNTER — Ambulatory Visit: Admitting: Cardiology

## 2024-04-12 ENCOUNTER — Ambulatory Visit

## 2024-04-12 VITALS — Ht 60.0 in | Wt 161.0 lb

## 2024-04-12 DIAGNOSIS — Z0000 Encounter for general adult medical examination without abnormal findings: Secondary | ICD-10-CM

## 2024-04-12 NOTE — Patient Instructions (Signed)
 Ms. Pinnix , Thank you for taking time out of your busy schedule to complete your Annual Wellness Visit with me. I enjoyed our conversation and look forward to speaking with you again next year. I, as well as your care team,  appreciate your ongoing commitment to your health goals. Please review the following plan we discussed and let me know if I can assist you in the future. Your Game plan/ To Do List    Referrals: If you haven't heard from the office you've been referred to, please reach out to them at the phone provided.   Follow up Visits: Next Medicare AWV with our clinical staff: 04/17/25   Have you seen your provider in the last 6 months (3 months if uncontrolled diabetes)? Yes Next Office Visit with your provider: 06/2024  Clinician Recommendations:  Aim for 30 minutes of exercise or brisk walking, 6-8 glasses of water, and 5 servings of fruits and vegetables each day.       This is a list of the screening recommended for you and due dates:  Health Maintenance  Topic Date Due   Eye exam for diabetics  12/26/2022   COVID-19 Vaccine (4 - 2024-25 season) 06/27/2023   DTaP/Tdap/Td vaccine (3 - Tdap) 01/06/2024   Medicare Annual Wellness Visit  04/04/2024   Zoster (Shingles) Vaccine (1 of 2) 05/27/2024*   Flu Shot  05/26/2024   DEXA scan (bone density measurement)  08/18/2024   Hemoglobin A1C  08/27/2024   Mammogram  11/21/2024   Yearly kidney health urinalysis for diabetes  11/22/2024   Complete foot exam   11/22/2024   Yearly kidney function blood test for diabetes  02/24/2025   Pneumococcal Vaccine for age over 110  Completed   Hepatitis C Screening  Completed   HPV Vaccine  Aged Out   Meningitis B Vaccine  Aged Out   Colon Cancer Screening  Discontinued   Cologuard (Stool DNA test)  Discontinued  *Topic was postponed. The date shown is not the original due date.    Advanced directives: (Copy Requested) Please bring a copy of your health care power of attorney and living  will to the office to be added to your chart at your convenience. You can mail to [facility] [address] [name], [place] [address] or email to [email or web address] Advance Care Planning is important because it:  [x]  Makes sure you receive the medical care that is consistent with your values, goals, and preferences  [x]  It provides guidance to your family and loved ones and reduces their decisional burden about whether or not they are making the right decisions based on your wishes.  Follow the link provided in your after visit summary or read over the paperwork we have mailed to you to help you started getting your Advance Directives in place. If you need assistance in completing these, please reach out to us  so that we can help you!  See attachments for Preventive Care and Fall Prevention Tips.

## 2024-04-12 NOTE — Progress Notes (Signed)
 Subjective:   Felicia Perry is a 78 y.o. who presents for a Medicare Wellness preventive visit.  As a reminder, Annual Wellness Visits don't include a physical exam, and some assessments may be limited, especially if this visit is performed virtually. We may recommend an in-person follow-up visit with your provider if needed.  Visit Complete: Virtual I connected with  Felicia Perry on 04/12/24 by a audio enabled telemedicine application and verified that I am speaking with the correct person using two identifiers.  Patient Location: Home  Provider Location: Home Office  I discussed the limitations of evaluation and management by telemedicine. The patient expressed understanding and agreed to proceed.  Vital Signs: Because this visit was a virtual/telehealth visit, some criteria may be missing or patient reported. Any vitals not documented were not able to be obtained and vitals that have been documented are patient reported.  VideoDeclined- This patient declined Librarian, academic. Therefore the visit was completed with audio only.  Persons Participating in Visit: Patient.  AWV Questionnaire: No: Patient Medicare AWV questionnaire was not completed prior to this visit.  Cardiac Risk Factors include: advanced age (>13men, >53 women);dyslipidemia;diabetes mellitus;hypertension;obesity (BMI >30kg/m2)     Objective:    Today's Vitals   04/12/24 1541  Weight: 161 lb (73 kg)  Height: 5' (1.524 m)   Body mass index is 31.44 kg/m.     04/12/2024    3:51 PM 04/05/2023    4:06 PM 03/10/2023    4:20 PM 09/25/2022    5:58 AM 05/04/2022   11:13 AM 02/23/2022    7:50 AM 01/14/2022    1:08 PM  Advanced Directives  Does Patient Have a Medical Advance Directive? Yes Yes Yes Yes No No No  Type of Estate agent of [place];Living will Healthcare Power of [place];Living will  Healthcare Power of Attorney     Does patient want to make changes  to medical advance directive?    No - Patient declined     Copy of Healthcare Power of Attorney in Chart? No - copy requested No - copy requested       Would patient like information on creating a medical advance directive?     No - Patient declined No - Patient declined     Current Medications (verified) Outpatient Encounter Medications as of 04/12/2024  Medication Sig   acetaminophen  (TYLENOL ) 325 MG tablet Take 650 mg by mouth every 6 (six) hours as needed for mild pain, moderate pain or headache.   albuterol  (VENTOLIN  HFA) 108 (90 Base) MCG/ACT inhaler Inhale 2 puffs into the lungs every 6 (six) hours as needed for wheezing or shortness of breath.   ALPRAZolam  (XANAX ) 1 MG tablet TAKE ONE TABLET BY MOUTH AT BEDTIME AS NEEDED FOR SLEEP -do not drive FOR EIGHT hours AFTER taking   amLODipine  (NORVASC ) 10 MG tablet Take 1 tablet (10 mg total) by mouth daily.   anastrozole  (ARIMIDEX ) 1 MG tablet TAKE ONE TABLET DAILY   aspirin  EC 81 MG tablet Take 1 tablet (81 mg total) by mouth daily. Swallow whole.   Bempedoic Acid -Ezetimibe  (NEXLIZET ) 180-10 MG TABS Take 180 mg by mouth daily.   budesonide -formoterol  (SYMBICORT ) 160-4.5 MCG/ACT inhaler Inhale 2 puffs into the lungs 2 (two) times daily.   carvedilol  (COREG ) 25 MG tablet Take 1 tablet (25 mg total) by mouth 2 (two) times daily with a meal.   famotidine  (PEPCID ) 20 MG tablet TAKE ONE TABLET TWICE DAILY   irbesartan  (AVAPRO )  300 MG tablet Take 1 tablet (300 mg total) by mouth daily.   Ketoconazole -Hydrocortisone  2-2.5 % CREA Apply twice daily.   levocetirizine (XYZAL ) 5 MG tablet TAKE ONE TABLET BY MOUTH EVERY MORNING   metFORMIN  (GLUCOPHAGE ) 1000 MG tablet Take 1 tablet (1,000 mg total) by mouth 2 (two) times daily with a meal.   omega-3 acid ethyl esters (LOVAZA ) 1 g capsule TAKE TWO CAPSULES EVERY DAY   ondansetron  (ZOFRAN ) 4 MG tablet Take 1 tablet (4 mg total) by mouth every 8 (eight) hours as needed for nausea or vomiting. Take prior to  antibiotic.   spironolactone  (ALDACTONE ) 25 MG tablet Take 0.5 tablets (12.5 mg total) by mouth daily.   triamcinolone  (KENALOG ) 0.025 % ointment Apply 1 Application topically 2 (two) times daily.   venlafaxine  XR (EFFEXOR -XR) 75 MG 24 hr capsule TAKE ONE CAPSULE DAILY WITH BREAKFAST   Vitamin D , Ergocalciferol , (DRISDOL ) 1.25 MG (50000 UNIT) CAPS capsule Take 1 capsule (50,000 Units total) by mouth every 7 (seven) days.   No facility-administered encounter medications on file as of 04/12/2024.    Allergies (verified) Penicillins, Cephalosporins, Citalopram  hydrobromide, Rosuvastatin , Zolpidem tartrate, Clarithromycin, and Levofloxacin   History: Past Medical History:  Diagnosis Date   Adenomatous polyp 11/23/2005   Anxiety    01/09/22- patient denies anxiety   Asthma 2003   Basal cell carcinoma 2021   right cheek per patient at [place] [facility]   Carotid stenosis    Chronic diastolic CHF (congestive heart failure) (HCC) 2017   Echo 2021 was normal   COPD (chronic obstructive pulmonary disease) (HCC)    Coronary artery disease 2017   a. HC on 08/14/16 showed RCA CTO s/p PCI, 60% LCX disease managed medically with recs to consider staged PCI if continued symptoms.    Depression 2008   pt reports MD diagnosed but she does not feel depressed   Diabetes mellitus without complication (HCC)    DIVERTICULOSIS, COLON 04/22/2007        GERD (gastroesophageal reflux disease) 2012   History of radiation therapy    Right breast- 03/04/22-04/02/22- Dr. Retta Caster   History of shingles    Hypertension 2008   Hypertensive heart disease    Insomnia 2003   Internal hemorrhoids    Osteoarthritis 2008   knees, hands, lower back (08/14/2016)   Pap smear for cervical cancer screening 2002   results normal   Personal history of radiation therapy    PUD (peptic ulcer disease) 1989   Seasonal allergies 2013   Sleep apnea    a. resolved with weight loss   Past Surgical History:   Procedure Laterality Date   BREAST BIOPSY Right 12/30/2021   BREAST EXCISIONAL BIOPSY Left    1980's x 3 - all benign   BREAST EXCISIONAL BIOPSY Right    1980's x 1 or 2 - all benign   BREAST LUMPECTOMY Right 01/22/2022   BREAST LUMPECTOMY WITH RADIOACTIVE SEED LOCALIZATION Right 01/22/2022   Procedure: RIGHT BREAST LUMPECTOMY WITH RADIOACTIVE SEED LOCALIZATION;  Surgeon: Sim Dryer, MD;  Location: [facility];  Service: General;  Laterality: Right;   BREAST SURGERY Left 1980s   Fibrous Tumors removed    CARDIAC CATHETERIZATION N/A 08/14/2016   Procedure: Left Heart Cath and Coronary Angiography;  Surgeon: Arnoldo Lapping, MD;  Location: [facility] INVASIVE CV LAB;  Service: Cardiovascular;  Laterality: N/A;   CARDIAC CATHETERIZATION N/A 08/14/2016   Procedure: Coronary Stent Intervention;  Surgeon: Arnoldo Lapping, MD;  Location[facility] INVASIVE CV LAB;  Service: Cardiovascular;  Laterality: N/A;   CATARACT EXTRACTION W/ INTRAOCULAR LENS  IMPLANT, BILATERAL Bilateral 05/2009   COLONOSCOPY N/A 2016   Cologuard 2019   CORONARY ANGIOPLASTY     detached retina left eye     may 2019   LEFT HEART CATH AND CORONARY ANGIOGRAPHY N/A 09/25/2022   Procedure: LEFT HEART CATH AND CORONARY ANGIOGRAPHY;  Surgeon: Odie Benne, MD;  Location: [facility] INVASIVE CV LAB;  Service: Cardiovascular;  Laterality: N/A;   TUBAL LIGATION  1970s   Family History  Problem Relation Age of Onset   Heart disease Mother    Hypertension Mother    Diabetes Mother    Dementia Mother    COPD Father    Dementia Maternal Grandmother    Colon cancer Neg Hx    Social History   Socioeconomic History   Marital status: Divorced    Spouse name: Not on file   Number of children: Not on file   Years of education: Not on file   Highest education level: Associate degree: occupational, Scientist, product/process development, or vocational program  Occupational History   Occupation: Cleaning  Tobacco Use   Smoking status: Former     Current packs/day: 0.00    Average packs/day: 1 pack/day for 26.0 years (26.0 ttl pk-yrs)    Types: Cigarettes    Start date: 1963    Quit date: 1989    Years since quitting: 36.4   Smokeless tobacco: Never  Vaping Use   Vaping status: Never Used  Substance and Sexual Activity   Alcohol use: No   Drug use: No   Sexual activity: Not Currently  Other Topics Concern   Not on file  Social History Narrative   Divorced for 38 years in 2016. 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    Social Drivers of Corporate investment banker Strain: Low Risk  (04/12/2024)   Overall Financial Resource Strain (CARDIA)    Difficulty of Paying Living Expenses: Not hard at all  Food Insecurity: No Food Insecurity (04/12/2024)   Hunger Vital Sign    Worried About Running Out of Food in the Last Year: Never true    Ran Out of Food in the Last Year: Never true  Transportation Needs: No Transportation Needs (04/12/2024)   PRAPARE - Administrator, Civil Service (Medical): No    Lack of Transportation (Non-Medical): No  Physical Activity: Sufficiently Active (04/12/2024)   Exercise Vital Sign    Days of Exercise per Week: 3 days    Minutes of Exercise per Session: 60 min  Stress: No Stress Concern Present (04/12/2024)   [name] of Occupational Health - Occupational Stress Questionnaire    Feeling of Stress: Only a little  Social Connections: Moderately Integrated (04/12/2024)   Social Connection and Isolation Panel    Frequency of Communication with Friends and Family: More than three times a week    Frequency of Social Gatherings with Friends and Family: More than three  times a week    Attends Religious Services: More than 4 times per year    Active Member of Clubs or  Organizations: Yes    Attends Engineer, structural: 1 to 4 times per year    Marital Status: Divorced    Tobacco Counseling Counseling given: Not Answered    Clinical Intake:  Pre-visit preparation completed: Yes  Pain : No/denies pain     BMI - recorded: 31.44 Nutritional Status: BMI > 30  Obese Nutritional Risks: None Diabetes: Yes CBG done?: No Did pt. bring in CBG monitor from home?: No  Lab Results  Component Value Date   HGBA1C 7.5 (H) 02/25/2024   HGBA1C 8.1 (H) 11/23/2023   HGBA1C 6.5 05/13/2023     How often do you need to have someone help you when you read instructions, pamphlets, or other written materials from your doctor or pharmacy?: 1 - Never  Interpreter Needed?: No  Information entered by :: Lamont Pilsner, LPN   Activities of Daily Living     04/12/2024    3:43 PM  In your present state of health, do you have any difficulty performing the following activities:  Hearing? 0  Vision? 0  Difficulty concentrating or making decisions? 0  Walking or climbing stairs? 0  Dressing or bathing? 0  Doing errands, shopping? 0  Preparing Food and eating ? N  Using the Toilet? N  In the past six months, have you accidently leaked urine? N  Do you have problems with loss of bowel control? N  Managing your Medications? N  Managing your Finances? N  Housekeeping or managing your Housekeeping? N    Patient Care Team: Almira Jaeger, MD as PCP - General (Family Medicine) Cody Das, MD as PCP - Cardiology (Cardiology) Isidro Margo, DO as Consulting Physician (Sports Medicine) Sheffield, Kelli R, PA-C as Physician Assistant (Dermatology) Myrle Aspen, [facility] (Inactive) as Pharmacist (Pharmacist) Sim Dryer, MD as Consulting Physician (General Surgery) Iruku, Praveena, MD as Consulting Physician (Hematology and  Oncology) Retta Caster, MD as Consulting Physician (Radiation Oncology)  I have updated your Care Teams any recent Medical Services you may have received from other providers in the past year.     Assessment:   This is a routine wellness examination for Felicia Perry.  Hearing/Vision screen Hearing Screening - Comments:: Pt denies any hearing issues  Vision Screening - Comments:: Pt follow up with eye Dr unsure of providers name    Goals Addressed             This Visit's Progress    Patient Stated       Working our in the gym an losing weight        Depression Screen     04/12/2024    3:45 PM 11/23/2023    9:00 AM 11/09/2023    2:45 PM 05/13/2023    3:05 PM 04/05/2023    4:04 PM 11/12/2022    3:11 PM 09/29/2021    8:56 AM  PHQ 2/9 Scores  PHQ - 2 Score 0 0 1 3 0 1 0  PHQ- 9 Score  2 4 11  1      Fall Risk     04/12/2024    3:53 PM 11/09/2023    2:45 PM 05/13/2023    3:05 PM 04/04/2023    7:36 PM 11/12/2022    3:10 PM  Fall Risk   Falls in the past year? 0 0 1 1 0  Number falls in past yr: 0 0 1 1 0  Injury with Fall? 0 0 1 1 1   Comment    head  Risk for fall due to : No Fall Risks No Fall Risks No Fall Risks History of fall(s);Impaired vision;Impaired balance/gait No Fall Risks  Follow up Falls evaluation completed;Falls prevention discussed  Falls evaluation completed Falls prevention discussed Falls evaluation completed      Data saved with a previous flowsheet row definition    MEDICARE RISK AT HOME:  Medicare Risk at Home Any stairs in or around the home?: Yes If so, are there any without handrails?: No Home free of loose throw rugs in walkways, pet beds, electrical cords, etc?: Yes Adequate lighting in your home to reduce risk of falls?: Yes Life alert?: No Use of a cane, walker or w/c?: No Grab bars in the bathroom?: Yes Shower chair or bench in shower?: Yes Elevated toilet seat or a handicapped toilet?: No  TIMED UP AND GO:  Was the test performed?   No  Cognitive Function: 6CIT completed    07/25/2019   10:12 AM 04/21/2018   10:23 AM 04/21/2018    9:31 AM  MMSE - Mini Mental State Exam  Not completed:  -- --  Orientation to time 5    Orientation to Place 5    Registration 3    Attention/ Calculation 5    Recall 3    Language- name 2 objects 2    Language- repeat 1    Language- follow 3 step command 3    Language- read & follow direction 1    Write a sentence 1    Copy design 1    Total score 30          04/12/2024    3:54 PM 04/05/2023    4:10 PM 09/29/2021    9:02 AM 08/16/2020    9:06 AM  6CIT Screen  What Year? 0 points 0 points 0 points 0 points  What month? 0 points 0 points 0 points 0 points  What time? 0 points 0 points 0 points   Count back from 20 0 points 0 points 0 points 0 points  Months in reverse 0 points 0 points 0 points 0 points  Repeat phrase 0 points 0 points 0 points 0 points  Total Score 0 points 0 points 0 points     Immunizations Immunization History  Administered Date(s) Administered   Fluad Quad(high Dose 65+) 07/25/2019, 08/07/2020, 08/12/2021, 11/12/2022   Fluad Trivalent(High Dose 65+) 11/23/2023   Influenza Split 09/21/2011, 07/07/2012   Influenza Whole 08/10/2008, 07/30/2010   Influenza, High Dose Seasonal PF 08/04/2016, 07/05/2018   Influenza,inj,Quad PF,6+ Mos 07/21/2013, 08/06/2015   Influenza-Unspecified 07/10/2014, 10/01/2017   PFIZER(Purple Top)SARS-COV-2 Vaccination 12/26/2019, 01/17/2020, 09/30/2020   Pneumococcal Conjugate-13 01/05/2014   Pneumococcal Polysaccharide-23 02/08/2015   Td 10/26/2001   Tetanus 01/05/2014    Screening Tests Health Maintenance  Topic Date Due   OPHTHALMOLOGY EXAM  12/26/2022   COVID-19 Vaccine (4 - 2024-25 season) 06/27/2023   DTaP/Tdap/Td (3 - Tdap) 01/06/2024   Zoster Vaccines- Shingrix (1 of 2) 05/27/2024 (Originally 06/22/1965)   INFLUENZA VACCINE  05/26/2024   DEXA SCAN  08/18/2024   HEMOGLOBIN A1C  08/27/2024   MAMMOGRAM   11/21/2024   Diabetic kidney evaluation - Urine ACR  11/22/2024   FOOT EXAM  11/22/2024   Diabetic kidney evaluation - eGFR measurement  02/24/2025   Medicare Annual Wellness (AWV)  04/12/2025   Pneumococcal Vaccine: 50+ Years  Completed   Hepatitis C Screening  Completed   HPV VACCINES  Aged Out   Meningococcal B Vaccine  Aged Out  Colonoscopy  Discontinued   Fecal DNA (Cologuard)  Discontinued    Health Maintenance  Health Maintenance Due  Topic Date Due   OPHTHALMOLOGY EXAM  12/26/2022   COVID-19 Vaccine (4 - 2024-25 season) 06/27/2023   DTaP/Tdap/Td (3 - Tdap) 01/06/2024   Health Maintenance Items Addressed: See Nurse Notes at the end of this note  Additional Screening:  Vision Screening: Recommended annual ophthalmology exams for early detection of glaucoma and other disorders of the eye. Would you like a referral to an eye doctor? No    Dental Screening: Recommended annual dental exams for proper oral hygiene  Community Resource Referral / Chronic Care Management: CRR required this visit?  No   CCM required this visit?  No   Plan:    I have personally reviewed and noted the following in the patient's chart:   Medical and social history Use of alcohol, tobacco or illicit drugs  Current medications and supplements including opioid prescriptions. Patient is not currently taking opioid prescriptions. Functional ability and status Nutritional status Physical activity Advanced directives List of other physicians Hospitalizations, surgeries, and ER visits in previous 12 months Vitals Screenings to include cognitive, depression, and falls Referrals and appointments  In addition, I have reviewed and discussed with patient certain preventive protocols, quality metrics, and best practice recommendations. A written personalized care plan for preventive services as well as general preventive health recommendations were provided to patient.   Bruno Capri,  LPN   [id number]   After Visit Summary: (MyChart) Due to this being a telephonic visit, the after visit summary with patients personalized plan was offered to patient via MyChart   Notes: Nothing significant to report at this time.

## 2024-04-14 ENCOUNTER — Other Ambulatory Visit: Payer: Self-pay

## 2024-04-14 DIAGNOSIS — Z789 Other specified health status: Secondary | ICD-10-CM

## 2024-04-14 DIAGNOSIS — I25110 Atherosclerotic heart disease of native coronary artery with unstable angina pectoris: Secondary | ICD-10-CM

## 2024-04-14 DIAGNOSIS — I10 Essential (primary) hypertension: Secondary | ICD-10-CM

## 2024-04-14 DIAGNOSIS — E782 Mixed hyperlipidemia: Secondary | ICD-10-CM

## 2024-04-14 MED ORDER — IRBESARTAN 300 MG PO TABS: 300.0000 mg | ORAL_TABLET | Freq: Every day | ORAL | 1 refills | Status: AC

## 2024-04-20 NOTE — Progress Notes (Signed)
 Document error. No visit on this day.

## 2024-04-21 ENCOUNTER — Ambulatory Visit (INDEPENDENT_AMBULATORY_CARE_PROVIDER_SITE_OTHER): Payer: Self-pay | Admitting: Family Medicine

## 2024-04-21 ENCOUNTER — Other Ambulatory Visit: Payer: Self-pay | Admitting: Family Medicine

## 2024-04-21 DIAGNOSIS — M25532 Pain in left wrist: Secondary | ICD-10-CM

## 2024-04-21 NOTE — Progress Notes (Deleted)
   LILLETTE Ileana Collet, PhD, LAT, ATC acting as a scribe for Artist Lloyd, MD.  Felicia Perry is a 78 y.o. female who presents to [facility] today for L wrist pain. Pt was previously seen by Dr. Claudene on 03/27/24 for DDD and OMT.  Today, pt c/o L wrist pain x ***. Pt suffered a fall ***. Pt locates pain to ***  Swelling: Treatments tried:  Dx testing: 08/18/22 DEXA scan  Pertinent review of systems: ***  Relevant historical information: ***   Exam:  There were no vitals taken for this visit. General: Well Developed, well nourished, and in no acute distress.   MSK: ***    Lab and Radiology Results No results found for this or any previous visit (from the past 72 hours). No results found.     Assessment and Plan: 78 y.o. female with ***   PDMP not reviewed this encounter. No orders of the defined types were placed in this encounter.  No orders of the defined types were placed in this encounter.    Discussed warning signs or symptoms. Please see discharge instructions. Patient expresses understanding.   ***

## 2024-04-24 ENCOUNTER — Ambulatory Visit: Admitting: Family Medicine

## 2024-04-24 ENCOUNTER — Telehealth: Payer: Self-pay | Admitting: Family Medicine

## 2024-04-24 ENCOUNTER — Ambulatory Visit (INDEPENDENT_AMBULATORY_CARE_PROVIDER_SITE_OTHER)

## 2024-04-24 DIAGNOSIS — M25532 Pain in left wrist: Secondary | ICD-10-CM | POA: Diagnosis not present

## 2024-04-24 DIAGNOSIS — M19042 Primary osteoarthritis, left hand: Secondary | ICD-10-CM | POA: Diagnosis not present

## 2024-04-24 DIAGNOSIS — M1812 Unilateral primary osteoarthritis of first carpometacarpal joint, left hand: Secondary | ICD-10-CM | POA: Diagnosis not present

## 2024-04-24 NOTE — Telephone Encounter (Signed)
 Pt was scheduled to see Dr. Joane today for wrist injury after fall, Dr. Joane had to cancel clinic today.  Xray order in, pt to come by for xray today to determine future scheduling needs. Has her regular appt with Dr. Claudene in late July. Just FYI

## 2024-04-25 NOTE — Telephone Encounter (Signed)
 My apologies for having to abruptly cancel my clinic today.  I looked at the x-ray myself and do not see a newly broken bone.  You do have a lot of arthritis at the base of the thumb and some calcifications around the base of the thumb which could certainly be the source of pain.  If she would like to schedule a visit with me sooner I am happy to accommodate her.  If her wrist brace and a bit of time is sufficient to get her to Dr. Claudene that is okay as well.

## 2024-04-26 ENCOUNTER — Ambulatory Visit: Payer: Self-pay | Admitting: Family Medicine

## 2024-04-26 NOTE — Progress Notes (Signed)
 Left wrist x-ray does show severe arthritis at the base of the thumb and into the wrist.

## 2024-05-08 ENCOUNTER — Other Ambulatory Visit: Payer: Self-pay | Admitting: Family Medicine

## 2024-05-11 ENCOUNTER — Other Ambulatory Visit: Payer: Self-pay

## 2024-05-11 ENCOUNTER — Other Ambulatory Visit: Payer: Self-pay | Admitting: Family Medicine

## 2024-05-11 MED ORDER — AMLODIPINE BESYLATE 10 MG PO TABS: 10.0000 mg | ORAL_TABLET | Freq: Every day | ORAL | 1 refills | Status: AC

## 2024-05-15 ENCOUNTER — Encounter: Payer: Self-pay | Admitting: Pharmacist

## 2024-05-15 NOTE — Progress Notes (Signed)
 Pharmacy Quality Measure Review  This patient is appearing on a report for being at risk of failing the adherence measure for diabetes and hypertension (ACEi/ARB) medications this calendar year.   Medication: irbesartan  300mg  Last fill date: 12/14/2023 for 90 day supply  Medication: metformin  1000mg  Last fill date: 02/28/2024 for 90 day supply (previous fill was 11/24/2023 for metformin  500mg )  Reviewed refill records - irbesartan  was actually last filled 04/14/2024  Insurance report was not up to date. No action needed at this time.  Metformin  due in August 2025.   Madelin Ray, PharmD Clinical Pharmacist [facility] Primary Care  Population Health [phone]

## 2024-05-17 ENCOUNTER — Ambulatory Visit: Attending: Cardiovascular Disease | Admitting: Cardiology

## 2024-05-19 NOTE — Progress Notes (Signed)
 [facility] [address] [place] [address] Phone: [phone] Subjective:   Felicia Perry, am serving as a scribe for Dr. Arthea Claudene.  I'm seeing this patient by the request  of:  Felicia Garnette KIDD, MD  CC: Left wrist pain  YEP:Dlagzrupcz  Felicia Perry is a 78 y.o. female coming in with complaint of back and neck pain. OMT on 03/27/2024. Patient states that she has been doing ok. Back is fine.   L CMC joint is painful after getting it pinched between her body and a armenia hutch. Xray in chart from 04/24/2024.  X-rays show severe arthritic changes of the CMC joints bilaterally.  Medications patient has been prescribed:   Taking:         Reviewed prior external information including notes and imaging from previsou exam, outside providers and external EMR if available.   As well as notes that were available from care everywhere and other healthcare systems.  Past medical history, social, surgical and family history all reviewed in electronic medical record.  No pertanent information unless stated regarding to the chief complaint.   Past Medical History:  Diagnosis Date   Adenomatous polyp 11/23/2005   Anxiety    01/09/22- patient denies anxiety   Asthma 2003   Basal cell carcinoma 2021   right cheek per patient at [facility]   Carotid stenosis    Chronic diastolic CHF (congestive heart failure) (HCC) 2017   Echo 2021 was normal   COPD (chronic obstructive pulmonary disease) (HCC)    Coronary artery disease 2017   a. HC on 08/14/16 showed RCA CTO s/p PCI, 60% LCX disease managed medically with recs to consider staged PCI if continued symptoms.    Depression 2008   pt reports MD diagnosed but she does not feel depressed   Diabetes mellitus without complication (HCC)    DIVERTICULOSIS, COLON 04/22/2007        GERD (gastroesophageal reflux disease) 2012   History of radiation therapy    Right breast- 03/04/22-04/02/22-  Dr. Lynwood Nasuti   History of shingles    Hypertension 2008   Hypertensive heart disease    Insomnia 2003   Internal hemorrhoids    Osteoarthritis 2008   knees, hands, lower back (08/14/2016)   Pap smear for cervical cancer screening 2002   results normal   Personal history of radiation therapy    PUD (peptic ulcer disease) 1989   Seasonal allergies 2013   Sleep apnea    a. resolved with weight loss    Allergies  Allergen Reactions   Penicillins Anaphylaxis    REACTION: per patient causes rash,hives   Cephalosporins Swelling    REACTION: tongue swelling   Citalopram  Hydrobromide Other (See Comments)    psychosis   Rosuvastatin  Other (See Comments)    Pt reports causes bilateral lower extremity muscle aches.    Zolpidem Tartrate     REACTION: difficulty with concentration   Clarithromycin Rash    REACTION: blisters in mouth   Levofloxacin Rash    REACTION: tongue swells     Review of Systems:  No headache, visual changes, nausea, vomiting, diarrhea, constipation, dizziness, abdominal pain, skin rash, fevers, chills, night sweats, weight loss, swollen lymph nodes, body aches, joint swelling, chest pain, shortness of breath, mood changes. POSITIVE muscle aches  Objective  Blood pressure 132/68, pulse 66, height 5' (1.524 m), weight 163 lb (73.9 kg), SpO2 97%.   General: No apparent distress alert and oriented  x3 mood and affect normal, dressed appropriately.  HEENT: Pupils equal, extraocular movements intact  Respiratory: Patient's speak in full sentences and does not appear short of breath  Cardiovascular: No lower extremity edema, non tender, no erythema  Arthritic changes of multiple joints.  Left wrist shows a positive Finkelstein's more than  Procedure: Real-time Ultrasound Guided Injection of left Abductor pollicis longs tendon sheath Device: GE Logiq Q7  Ultrasound guided injection is preferred based studies that show increased duration, increased effect,  greater accuracy, decreased procedural pain, increased response rate with ultrasound guided versus blind injection.  Verbal informed consent obtained.  Time-out conducted.  Noted no overlying erythema, induration, or other signs of local infection.  Skin prepped in a sterile fashion.  Local anesthesia: Topical Ethyl chloride.  With sterile technique and under real time ultrasound guidance:  tendon visualized.  23g 5/8 inch needle inserted distal to proximal approach into tendon sheath. Pictures taken  for needle placement. Patient did have injection of 0.5 cc of of 0.5% Marcaine , and 0.5 cc of Kenalog  40 mg/dL. Completed without difficulty  Pain immediately resolved suggesting accurate placement of the medication.  Advised to call if fevers/chills, erythema, induration, drainage, or persistent bleeding.  Images permanently stored Impression: Technically successful ultrasound guided injection.      Assessment and Plan:  De Quervain's tenosynovitis, left Patient given injection and tolerated the procedure well, discussed icing regimen of home exercises, discussed which activities to do in which 1 to avoid.  Thumb spica splint given.  Does have underlying arthritis that we will need to monitor.  Discussed icing regimen.  Follow-up again in 6 to 8 weeks.       The above documentation has been reviewed and is accurate and complete Tremayne Sheldon M Cesiah Westley, DO          Note: This dictation was prepared with Dragon dictation along with smaller phrase technology. Any transcriptional errors that result from this process are unintentional.

## 2024-05-22 ENCOUNTER — Encounter: Payer: Self-pay | Admitting: Family Medicine

## 2024-05-22 ENCOUNTER — Ambulatory Visit: Admitting: Family Medicine

## 2024-05-22 ENCOUNTER — Other Ambulatory Visit: Payer: Self-pay

## 2024-05-22 VITALS — BP 132/68 | HR 66 | Ht 60.0 in | Wt 163.0 lb

## 2024-05-22 DIAGNOSIS — M654 Radial styloid tenosynovitis [de Quervain]: Secondary | ICD-10-CM | POA: Diagnosis not present

## 2024-05-22 DIAGNOSIS — M79645 Pain in left finger(s): Secondary | ICD-10-CM

## 2024-05-22 NOTE — Assessment & Plan Note (Signed)
 Patient given injection and tolerated the procedure well, discussed icing regimen of home exercises, discussed which activities to do in which 1 to avoid.  Thumb spica splint given.  Does have underlying arthritis that we will need to monitor.  Discussed icing regimen.  Follow-up again in 6 to 8 weeks.

## 2024-05-22 NOTE — Progress Notes (Unsigned)
 [facility] [place]  ASSESSMENT / PLAN: Felicia Perry is a 78 y.o. female with asymptomatic bilateral carotid artery stenosis (R 60-79%; L 60-79%).   Recommend:  Abstinence from all tobacco products. Blood glucose control with goal A1c < 7%. Blood pressure control with goal blood pressure < 140/90 mmHg. Lipid reduction therapy with goal LDL-C <100 mg/dL  Aspirin  81mg  PO QD.  Atorvastatin  40-80mg  PO QD (or other high intensity statin therapy).  Follow-up in 6 months with repeat carotid duplex  CHIEF COMPLAINT: Carotid artery stenosis on duplex  HISTORY OF PRESENT ILLNESS: Felicia Perry is a 78 y.o. female referred to clinic for evaluation of bilateral carotid artery stenosis.  Patient is asymptomatic from a carotid artery standpoint.  She specifically denies symptoms of amaurosis fugax, facial droop, unilateral weakness or numbness, difficulty speaking.  Patient reports no strokes or mini strokes that she is aware of, and none in the last 6 months.  We reviewed her noninvasive testing in detail.  I reviewed the natural history of carotid artery stenosis.  I explained the rationale for surveillance.  05/23/24: Returns for surveillance.  Doing very well overall.  Injured her wrist recently doing cart wheels with her grandchildren (!).  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  No neurologic symptoms.  We reviewed her duplex in detail.  Past Medical History:  Diagnosis Date   Adenomatous polyp 11/23/2005   Anxiety    01/09/22- patient denies anxiety   Asthma 2003   Basal cell carcinoma 2021   right cheek per patient at [facility] [facility]   Carotid stenosis    Chronic diastolic CHF (congestive heart failure) (HCC) 2017   Echo 2021 was normal   COPD (chronic obstructive pulmonary disease) (HCC)    Coronary artery disease 2017   a. HC on 08/14/16 showed RCA CTO s/p PCI, 60% LCX disease managed medically with recs to consider staged PCI if continued  symptoms.    Depression 2008   pt reports MD diagnosed but she does not feel depressed   Diabetes mellitus without complication (HCC)    DIVERTICULOSIS, COLON 04/22/2007        GERD (gastroesophageal reflux disease) 2012   History of radiation therapy    Right breast- 03/04/22-04/02/22- Dr. Lynwood Nasuti   History of shingles    Hypertension 2008   Hypertensive heart disease    Insomnia 2003   Internal hemorrhoids    Osteoarthritis 2008   knees, hands, lower back (08/14/2016)   Pap smear for cervical cancer screening 2002   results normal   Personal history of radiation therapy    PUD (peptic ulcer disease) 1989   Seasonal allergies 2013   Sleep apnea    a. resolved with weight loss    Past Surgical History:  Procedure Laterality Date   BREAST BIOPSY Right 12/30/2021   BREAST EXCISIONAL BIOPSY Left    1980's x 3 - all benign   BREAST EXCISIONAL BIOPSY Right    1980's x 1 or 2 - all benign   BREAST LUMPECTOMY Right 01/22/2022   BREAST LUMPECTOMY WITH RADIOACTIVE SEED LOCALIZATION Right 01/22/2022   Procedure: RIGHT BREAST LUMPECTOMY WITH RADIOACTIVE SEED LOCALIZATION;  Surgeon: Vanderbilt Ned, MD;  Location: [facility];  Service: General;  Laterality: Right;   BREAST SURGERY Left 1980s   Fibrous Tumors removed    CARDIAC CATHETERIZATION N/A 08/14/2016   Procedure: Left Heart Cath and Coronary Angiography;  Surgeon: Ozell Fell, MD;  Location: [facility] INVASIVE CV  LAB;  Service: Cardiovascular;  Laterality: N/A;   CARDIAC CATHETERIZATION N/A 08/14/2016   Procedure: Coronary Stent Intervention;  Surgeon: Ozell Fell, MD;  Location: [facility] INVASIVE CV LAB;  Service: Cardiovascular;  Laterality: N/A;   CATARACT EXTRACTION W/ INTRAOCULAR LENS  IMPLANT, BILATERAL Bilateral 05/2009   COLONOSCOPY N/A 2016   Cologuard 2019   CORONARY ANGIOPLASTY     detached retina left eye     may 2019   LEFT HEART CATH AND CORONARY ANGIOGRAPHY N/A 09/25/2022   Procedure: LEFT  HEART CATH AND CORONARY ANGIOGRAPHY;  Surgeon: Verlin Lonni BIRCH, MD;  Location: [facility] INVASIVE CV LAB;  Service: Cardiovascular;  Laterality: N/A;   TUBAL LIGATION  1970s    Family History  Problem Relation Age of Onset   Heart disease Mother    Hypertension Mother    Diabetes Mother    Dementia Mother    COPD Father    Dementia Maternal Grandmother    Colon cancer Neg Hx     Social History   Socioeconomic History   Marital status: Divorced    Spouse name: Not on file   Number of children: Not on file   Years of education: Not on file   Highest education level: Associate degree: occupational, Scientist, product/process development, or vocational program  Occupational History   Occupation: Cleaning  Tobacco Use   Smoking status: Former    Current packs/day: 0.00    Average packs/day: 1 pack/day for 26.0 years (26.0 ttl pk-yrs)    Types: Cigarettes    Start date: 1963    Quit date: 1989    Years since quitting: 36.5   Smokeless tobacco: Never  Vaping Use   Vaping status: Never Used  Substance and Sexual Activity   Alcohol use: No   Drug use: No   Sexual activity: Not Currently  Other Topics Concern   Not on file  Social History Narrative   Divorced for 38 years in 2016. 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    Social Drivers of Corporate investment banker Strain: Low Risk  (04/12/2024)   Overall Financial Resource Strain (CARDIA)    Difficulty of Paying Living Expenses: Not hard at all  Food Insecurity: No Food Insecurity (04/12/2024)   Hunger Vital Sign    Worried About Running Out of Food in the Last Year: Never true    Ran Out of Food in the Last Year: Never true   Transportation Needs: No Transportation Needs (04/12/2024)   PRAPARE - Administrator, Civil Service (Medical): No    Lack of Transportation (Non-Medical): No  Physical Activity: Sufficiently Active (04/12/2024)   Exercise Vital Sign    Days of Exercise per Week: 3 days    Minutes of Exercise per Session: 60 min  Stress: No Stress Concern Present (04/12/2024)   [name] of Occupational Health - Occupational Stress Questionnaire    Feeling of Stress: Only a little  Social Connections: Moderately Integrated (04/12/2024)   Social Connection and Isolation Panel    Frequency of Communication with Friends and Family: More than three times a week    Frequency  of Social Gatherings with Friends and Family: More than three times a week    Attends Religious Services: More than 4 times per year    Active Member of [employer] or Organizations: Yes    Attends Banker Meetings: 1 to 4 times per year    Marital Status: Divorced  Catering manager Violence: Not At Risk (04/12/2024)   Humiliation, Afraid, Rape, and Kick questionnaire    Fear of Current or Ex-Partner: No    Emotionally Abused: No    Physically Abused: No    Sexually Abused: No    Allergies  Allergen Reactions   Penicillins Anaphylaxis    REACTION: per patient causes rash,hives   Cephalosporins Swelling    REACTION: tongue swelling   Citalopram  Hydrobromide Other (See Comments)    psychosis   Rosuvastatin  Other (See Comments)    Pt reports causes bilateral lower extremity muscle aches.    Zolpidem Tartrate     REACTION: difficulty with concentration   Clarithromycin Rash    REACTION: blisters in mouth   Levofloxacin Rash    REACTION: tongue swells    Current Outpatient Medications  Medication Sig Dispense Refill   acetaminophen  (TYLENOL ) 325 MG tablet Take 650 mg by mouth every 6 (six) hours as needed for mild pain, moderate pain or headache.     albuterol  (VENTOLIN  HFA) 108 (90 Base) MCG/ACT  inhaler Inhale 2 puffs into the lungs every 6 (six) hours as needed for wheezing or shortness of breath. 18 g 5   ALPRAZolam  (XANAX ) 1 MG tablet TAKE ONE TABLET BY MOUTH AT BEDTIME AS NEEDED FOR SLEEP -do not drive FOR EIGHT hours AFTER taking 90 tablet 1   amLODipine  (NORVASC ) 10 MG tablet Take 1 tablet (10 mg total) by mouth daily. 90 tablet 1   anastrozole  (ARIMIDEX ) 1 MG tablet TAKE ONE TABLET DAILY 30 tablet 3   aspirin  EC 81 MG tablet Take 1 tablet (81 mg total) by mouth daily. Swallow whole. 90 tablet 3   Bempedoic Acid -Ezetimibe  (NEXLIZET ) 180-10 MG TABS Take 180 mg by mouth daily. 90 tablet 2   budesonide -formoterol  (SYMBICORT ) 160-4.5 MCG/ACT inhaler Inhale 2 puffs into the lungs 2 (two) times daily. 1 each 11   carvedilol  (COREG ) 25 MG tablet Take 1 tablet (25 mg total) by mouth 2 (two) times daily with a meal. 180 tablet 1   famotidine  (PEPCID ) 20 MG tablet TAKE ONE TABLET TWICE DAILY 60 tablet 5   irbesartan  (AVAPRO ) 300 MG tablet Take 1 tablet (300 mg total) by mouth daily. 90 tablet 1   Ketoconazole -Hydrocortisone  2-2.5 % CREA Apply twice daily. 30 g 0   levocetirizine (XYZAL ) 5 MG tablet TAKE ONE TABLET BY MOUTH EVERY MORNING 90 tablet 3   metFORMIN  (GLUCOPHAGE ) 1000 MG tablet Take 1 tablet (1,000 mg total) by mouth 2 (two) times daily with a meal. 180 tablet 3   omega-3 acid ethyl esters (LOVAZA ) 1 g capsule TAKE TWO CAPSULES EVERY DAY 180 capsule 3   ondansetron  (ZOFRAN ) 4 MG tablet Take 1 tablet (4 mg total) by mouth every 8 (eight) hours as needed for nausea or vomiting. Take prior to antibiotic. 20 tablet 0   spironolactone  (ALDACTONE ) 25 MG tablet Take 0.5 tablets (12.5 mg total) by mouth daily. 45 tablet 1   triamcinolone  (KENALOG ) 0.025 % ointment Apply 1 Application topically 2 (two) times daily. 30 g 0   venlafaxine  XR (EFFEXOR -XR) 75 MG 24 hr capsule TAKE ONE CAPSULE DAILY WITH BREAKFAST 90 capsule 2  Vitamin D , Ergocalciferol , (DRISDOL ) 1.25 MG (50000 UNIT) CAPS capsule  Take 1 capsule (50,000 Units total) by mouth every 7 (seven) days. 13 capsule 1   No current facility-administered medications for this visit.    PHYSICAL EXAM There were no vitals filed for this visit.   Elderly woman in no distress Regular rate and rhythm Unlabored breathing Palpable radial pulses No focal neurodeficits Normal gait and station  PERTINENT LABORATORY AND RADIOLOGIC DATA  Most recent CBC    Latest Ref Rng & Units 12/01/2023   12:05 PM 11/23/2023    9:42 AM 05/13/2023    3:57 PM  CBC  WBC 4.0 - 10.5 K/uL 6.7  8.6  8.7   Hemoglobin 12.0 - 15.0 g/dL 85.5  85.6  86.0   Hematocrit 36.0 - 46.0 % 41.7  41.9  42.3   Platelets 150 - 400 K/uL 342  324.0  374.0      Most recent CMP    Latest Ref Rng & Units 02/25/2024    9:24 AM 12/06/2023    2:49 PM 12/01/2023   12:05 PM  CMP  Glucose 70 - 99 mg/dL 858  879  838   BUN 6 - 23 mg/dL 24  21  31    Creatinine 0.40 - 1.20 mg/dL 9.06  9.14  8.77   Sodium 135 - 145 mEq/L 136  138  134   Potassium 3.5 - 5.1 mEq/L 4.6  4.4  4.5   Chloride 96 - 112 mEq/L 98  99  98   CO2 19 - 32 mEq/L 27  28  24    Calcium  8.4 - 10.5 mg/dL 89.6  9.8  89.9   Total Protein 6.0 - 8.3 g/dL 7.8  7.5    Total Bilirubin 0.2 - 1.2 mg/dL 0.4  0.7    Alkaline Phos 39 - 117 U/L 73  65    AST 0 - 37 U/L 34  21    ALT 0 - 35 U/L 28  15      Renal function CrCl cannot be calculated (Patient's most recent lab result is older than the maximum 21 days allowed.).  Hgb A1c MFr Bld (%)  Date Value  02/25/2024 7.5 (H)    LDL Cholesterol (Calc)  Date Value Ref Range Status  08/07/2020 48 mg/dL (calc) Final    Comment:    Reference range: <100 . Desirable range <100 mg/dL for primary prevention;   <70 mg/dL for patients with CHD or diabetic patients  with > or = 2 CHD risk factors. SABRA LDL-C is now calculated using the Martin-Hopkins  calculation, which is a validated novel method providing  better accuracy than the Friedewald equation in the   estimation of LDL-C.  Gladis APPLETHWAITE et al. SANDREA. 7986;689(80): 2061-2068  (http://education.[facility].com/faq/FAQ164)    Direct LDL  Date Value Ref Range Status  11/23/2023 53.0 mg/dL Final    Comment:    Optimal:  <100 mg/dLNear or Above Optimal:  100-129 mg/dLBorderline High:  130-159 mg/dLHigh:  160-189 mg/dLVery High:  >190 mg/dL    Carotid Duplex 2/70/7974   Right Carotid: Velocities in the right ICA are consistent with a 60-79%                 stenosis.   Left Carotid: Velocities in the left ICA are consistent with a 60-79%  stenosis.   Vertebrals: Bilateral vertebral arteries demonstrate antegrade flow.  Subclavians: Normal flow hemodynamics were seen in bilateral subclavian  arteries.   Debby SAILOR. Magda, MD FACS [facility] Phone Number: [phone] 05/22/2024 4:16 PM   Total time spent on preparing this encounter including chart review, data review, collecting history, examining the patient, coordinating care for this established patient, 20 minutes  Portions of this report may have been transcribed using voice recognition software.  Every effort has been made to ensure accuracy; however, inadvertent computerized transcription errors may still be present.

## 2024-05-22 NOTE — Patient Instructions (Signed)
 Injected L thumb today Wrist brace day and night for 1 week Then at night for 2 weeks See me in 3 months

## 2024-05-23 ENCOUNTER — Encounter: Payer: Self-pay | Admitting: Vascular Surgery

## 2024-05-23 ENCOUNTER — Ambulatory Visit (HOSPITAL_COMMUNITY)
Admission: RE | Admit: 2024-05-23 | Discharge: 2024-05-23 | Disposition: A | Discharge status: Home / Self Care | Payer: Medicare Other | Source: Ambulatory Visit | Attending: Vascular Surgery | Admitting: Vascular Surgery

## 2024-05-23 ENCOUNTER — Ambulatory Visit: Payer: Medicare Other | Admitting: Vascular Surgery

## 2024-05-23 VITALS — BP 131/78 | HR 61 | Temp 97.9°F | Ht 60.0 in | Wt 162.0 lb

## 2024-05-23 DIAGNOSIS — I6523 Occlusion and stenosis of bilateral carotid arteries: Secondary | ICD-10-CM | POA: Insufficient documentation

## 2024-05-24 ENCOUNTER — Other Ambulatory Visit: Payer: Self-pay | Admitting: *Deleted

## 2024-05-24 DIAGNOSIS — I6523 Occlusion and stenosis of bilateral carotid arteries: Secondary | ICD-10-CM

## 2024-05-29 ENCOUNTER — Ambulatory Visit: Payer: Medicare Other | Admitting: Family Medicine

## 2024-05-30 ENCOUNTER — Encounter: Payer: Self-pay | Admitting: Pharmacist

## 2024-05-30 NOTE — Progress Notes (Signed)
 Pharmacy Quality Measure Review  This patient is appearing on a report for being at risk of failing the adherence measure for diabetes medications this calendar year.   Medication: metformin   Last fill date: 11/24/2023 for 90 day supply per insurance report.   Patient has actually filled at [facility] 02/28/2024 for 90 DS per Dr Annemarie database. Spoke with pharmacist at [facility] to verify pick up - was not picked up until 03/11/2024 so likely that patient has not needed refill yet.   Will continue to follow metformin  adherence.  Also checked to see if stain exclusion diagnosis has been used yet this year since patient has CAD and type 2 DM. She is taking Nexlizet  daily. Dr Katrinka did use diagnosis of statin myalgias at visit in January 2025. Patient should be excluded from SUPD measure.   Madelin Ray, PharmD Clinical Pharmacist [facility]  Population Health [phone]

## 2024-06-05 DIAGNOSIS — E119 Type 2 diabetes mellitus without complications: Secondary | ICD-10-CM | POA: Diagnosis not present

## 2024-06-05 LAB — HM DIABETES EYE EXAM

## 2024-06-07 ENCOUNTER — Other Ambulatory Visit: Payer: Self-pay | Admitting: Physician Assistant

## 2024-06-07 DIAGNOSIS — Z79899 Other long term (current) drug therapy: Secondary | ICD-10-CM

## 2024-06-07 DIAGNOSIS — I5032 Chronic diastolic (congestive) heart failure: Secondary | ICD-10-CM

## 2024-06-07 DIAGNOSIS — I25119 Atherosclerotic heart disease of native coronary artery with unspecified angina pectoris: Secondary | ICD-10-CM

## 2024-06-07 DIAGNOSIS — I10 Essential (primary) hypertension: Secondary | ICD-10-CM

## 2024-06-07 DIAGNOSIS — R0602 Shortness of breath: Secondary | ICD-10-CM

## 2024-06-07 DIAGNOSIS — R0609 Other forms of dyspnea: Secondary | ICD-10-CM

## 2024-06-09 ENCOUNTER — Other Ambulatory Visit (HOSPITAL_COMMUNITY): Payer: Self-pay

## 2024-06-12 ENCOUNTER — Other Ambulatory Visit: Payer: Self-pay | Admitting: Physician Assistant

## 2024-06-12 DIAGNOSIS — R0602 Shortness of breath: Secondary | ICD-10-CM

## 2024-06-12 DIAGNOSIS — I5032 Chronic diastolic (congestive) heart failure: Secondary | ICD-10-CM

## 2024-06-12 DIAGNOSIS — Z79899 Other long term (current) drug therapy: Secondary | ICD-10-CM

## 2024-06-12 DIAGNOSIS — R0609 Other forms of dyspnea: Secondary | ICD-10-CM

## 2024-06-12 DIAGNOSIS — I10 Essential (primary) hypertension: Secondary | ICD-10-CM

## 2024-06-12 DIAGNOSIS — I25119 Atherosclerotic heart disease of native coronary artery with unspecified angina pectoris: Secondary | ICD-10-CM

## 2024-06-13 ENCOUNTER — Other Ambulatory Visit: Payer: Self-pay

## 2024-06-15 ENCOUNTER — Ambulatory Visit: Attending: Cardiology | Admitting: Cardiology

## 2024-06-15 ENCOUNTER — Encounter: Payer: Self-pay | Admitting: Cardiology

## 2024-06-15 VITALS — BP 130/56 | HR 62 | Ht 60.0 in | Wt 156.8 lb

## 2024-06-15 DIAGNOSIS — E118 Type 2 diabetes mellitus with unspecified complications: Secondary | ICD-10-CM | POA: Diagnosis not present

## 2024-06-15 DIAGNOSIS — I25119 Atherosclerotic heart disease of native coronary artery with unspecified angina pectoris: Secondary | ICD-10-CM | POA: Diagnosis not present

## 2024-06-15 DIAGNOSIS — I5032 Chronic diastolic (congestive) heart failure: Secondary | ICD-10-CM

## 2024-06-15 DIAGNOSIS — I10 Essential (primary) hypertension: Secondary | ICD-10-CM

## 2024-06-15 DIAGNOSIS — R0609 Other forms of dyspnea: Secondary | ICD-10-CM | POA: Diagnosis not present

## 2024-06-15 DIAGNOSIS — E782 Mixed hyperlipidemia: Secondary | ICD-10-CM

## 2024-06-15 DIAGNOSIS — I350 Nonrheumatic aortic (valve) stenosis: Secondary | ICD-10-CM | POA: Insufficient documentation

## 2024-06-15 DIAGNOSIS — Z79899 Other long term (current) drug therapy: Secondary | ICD-10-CM

## 2024-06-15 MED ORDER — SPIRONOLACTONE 25 MG PO TABS: 25.0000 mg | ORAL_TABLET | Freq: Every day | ORAL | 1 refills | Status: AC

## 2024-06-15 MED ORDER — CARVEDILOL 12.5 MG PO TABS: 12.5000 mg | ORAL_TABLET | Freq: Two times a day (BID) | ORAL | 3 refills | Status: AC

## 2024-06-15 NOTE — Patient Instructions (Signed)
 Medication Instructions:  INCREASE Spironolactone  to 25 mg dailt  DECREASE Carvedilol  to 12.5 mg twice daily   *If you need a refill on your cardiac medications before your next appointment, please call your pharmacy*  Lab Work ON MONDAY (06/19/2024): proBNP Fasting lipid panel  BMP A1C  If you have labs (blood work) drawn today and your tests are completely normal, you will receive your results only by: MyChart Message (if you have MyChart) OR A paper copy in the mail If you have any lab test that is abnormal or we need to change your treatment, we will call you to review the results.  Testing/Procedures: ECHO (BEFORE NEXT APPOINTMENT)  Your physician has requested that you have an echocardiogram. Echocardiography is a painless test that uses sound waves to create images of your heart. It provides your doctor with information about the size and shape of your heart and how well your heart's chambers and valves are working. This procedure takes approximately one hour. There are no restrictions for this procedure. Please do NOT wear cologne, perfume, aftershave, or lotions (deodorant is allowed). Please arrive 15 minutes prior to your appointment time.  Please note: We ask at that you not bring children with you during ultrasound (echo/ vascular) testing. Due to room size and safety concerns, children are not allowed in the ultrasound rooms during exams. Our front office staff cannot provide observation of children in our lobby area while testing is being conducted. An adult accompanying a patient to their appointment will only be allowed in the ultrasound room at the discretion of the ultrasound technician under special circumstances. We apologize for any inconvenience.   Follow-Up: At [facility], you and your health needs are our priority.  As part of our continuing mission to provide you with exceptional heart care, our providers are all part of one team.  This team includes  your primary Cardiologist (physician) and Advanced Practice Providers or APPs (Physician Assistants and Nurse Practitioners) who all work together to provide you with the care you need, when you need it.  Your next appointment:   3 month(s)  Provider:   Newman JINNY Lawrence, MD

## 2024-06-15 NOTE — Progress Notes (Signed)
 Cardiology Office Note:  .   Date:  06/15/2024  ID:  Kazi, Montoro February 10, 1946, MRN [id number] PCP: Katrinka Garnette KIDD, MD  [facility] Providers Cardiologist:  Newman Lawrence, MD PCP: Katrinka Garnette KIDD, MD  Chief Complaint  Patient presents with   Shortness of Breath     Felicia Perry is a 78 y.o. female with hypertension, hyperlipidemia, type 2 diabetes mellitus, statin intolerance, CAD, HFpEF, carotid artery disease  Discussed the use of AI scribe software for clinical note transcription with the patient, who gave verbal consent to proceed.  History of Present Illness  Patient was previously established with Dr. Hobart, last seen by her in 2020 for.  Subsequently, she was seen by Orren Fabry in 09/2023.  Patient has CAD for which she underwent CTO PCI to RCA in 2017, rest of the disease was moderate.  Cath in 2023 showed stable unchanged CAD with patent RCA stent and moderate left circumflex disease again.  Filling pressures were normal.  Patient is also seeing Dr. Magda for asymptomatic bilateral carotid artery stenosis, for which medical management is recommended.  Patient is here for follow-up, her first visit with me.  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  However, she does report exertional dyspnea symptoms from usual walking.  She denies any chest pain.  She reports occasional lightheadedness, but denies any syncope.  She denies any leg edema symptoms.  Reviewed previous echocardiogram from 09/2023 that showed calcified aortic valve with moderate aortic stenosis and mild aortic regurgitation.  Normal EF, with grade 1 diastolic dysfunction was also noted.    Vitals:   06/15/24 0832  BP: (!) 130/56  Pulse: 62  SpO2: 97%      Review of Systems  Cardiovascular:  Positive for dyspnea on exertion. Negative for chest pain, leg swelling, palpitations and syncope.  Neurological:  Positive for light-headedness.        Studies  Reviewed: SABRA        EKG 12/01/2023: Sinus rhythm 71 bpm Normal QTc interval Normal EKG  Labs 1-02/2024: Chol 126, TG 294, HDL 30, LDL 31 HbA1C 7.5% Hb 14.4 Cr 0.9  Coronary angiogram 2023: The LAD is a moderate to large caliber vessel that does not wrap around the apex. No angiographic evidence of disease in this vessel. Moderate caliber first diagonal branch with mild plaque.  The Circumflex is a large caliber vessel with two moderate caliber obtuse marginal branches. There is moderate disease in the AV groove Circumflex just beyond the takeoff of the first obtuse marginal branch and moderate disease at the takeoff of the second obtuse marginal branch. These lesions do not appear to be flow limiting.  The RCA is a large dominant artery with patent proximal, mid and distal stents with minimal restenosis.  Normal LVEDP Normal LV systolic function   Recommendations: Stable CAD with no flow limiting lesions. Moderate stenosis in the Circumflex should not be contributing to her dyspnea. Normal LV filling pressure with preserved LV systolic function. Explore other reasons for dyspnea.    Echocardiogram 09/2023:  1. Left ventricular ejection fraction, by estimation, is 60 to 65%. The  left ventricle has normal function. The left ventricle has no regional  wall motion abnormalities. There is moderate concentric left ventricular  hypertrophy. Left ventricular  diastolic parameters are consistent with Grade I diastolic dysfunction  (impaired relaxation).   2. Right ventricular systolic function is normal. The right ventricular  size is normal. Tricuspid regurgitation signal is inadequate  for assessing  PA pressure.   3. Left atrial size was mildly dilated.   4. The mitral valve is normal in structure. Trivial mitral valve  regurgitation. No evidence of mitral stenosis.   5. The aortic valve is tricuspid. There is moderate calcification of the  aortic valve. Aortic valve regurgitation is  mild. Moderate aortic valve  stenosis. Aortic valve area, by VTI measures 1.44 cm. Aortic valve mean  gradient measures 18.0 mmHg.   6. The inferior vena cava is normal in size with greater than 50%  respiratory variability, suggesting right atrial pressure of 3 mmHg.     Physical Exam Vitals and nursing note reviewed.  Constitutional:      General: She is not in acute distress. Neck:     Vascular: No JVD.  Cardiovascular:     Rate and Rhythm: Normal rate and regular rhythm.     Heart sounds: Murmur heard.     Harsh midsystolic murmur is present with a grade of 3/6 at the upper right sternal border radiating to the neck.  Pulmonary:     Effort: Pulmonary effort is normal.     Breath sounds: Normal breath sounds. No wheezing or rales.  Musculoskeletal:     Right lower leg: No tenderness.     Left lower leg: No tenderness.      VISIT DIAGNOSES:   ICD-10-CM   1. Type 2 diabetes mellitus with complication, without long-term current use of insulin (HCC)  E11.8 HgB A1c    2. Coronary artery disease involving native coronary artery of native heart with angina pectoris (HCC)  I25.119 Pro b natriuretic peptide (BNP)    spironolactone  (ALDACTONE ) 25 MG tablet    3. DOE (dyspnea on exertion)  R06.09 Pro b natriuretic peptide (BNP)    ECHOCARDIOGRAM COMPLETE    spironolactone  (ALDACTONE ) 25 MG tablet    4. Essential hypertension  I10 Basic metabolic panel with GFR    spironolactone  (ALDACTONE ) 25 MG tablet    5. Chronic heart failure with preserved ejection fraction (HCC)  I50.32 spironolactone  (ALDACTONE ) 25 MG tablet    6. Medication management  Z79.899 spironolactone  (ALDACTONE ) 25 MG tablet    7. Mixed hyperlipidemia  E78.2 Lipid panel    8. Nonrheumatic aortic valve stenosis  I35.0        Felicia Perry is a 78 y.o. female with hypertension, hyperlipidemia, type 2 diabetes mellitus, statin intolerance, CAD, HFpEF, carotid artery disease Assessment & Plan  Dyspnea on  exertion: NYHA class II. Differential from cardiac standpoint include progressive aortic stenosis, as well as HFpEF. Physical exam shows grade 3 aortic stenosis murmur, no signs of heart failure at rest. Recommend echocardiogram, proBNP. If aortic stenosis has progressed to severe, will need to consider aortic valve replacement.  If aortic stenosis remains moderate, it would be interesting to see her proBNP and diastolic dysfunction grade that could suggest HFpEF. Regardless, I would recommend adjusting her medical management to suit that of HFpEF.  I have reduced her carvedilol  from 25 mg twice daily to 12.5 mg twice daily.  If blood pressure stays well-controlled with this change, either Dr. Katrinka or I could  refill her next carvedilol  prescription at 12.5 mg twice daily.  Instead, I have increased her spironolactone  from 12.5 mg daily to 25 mg daily. Check BMP, proBNP in 3 days.  If aortic stenosis is moderate, could consider participation in clinical trial call [employer] looking at reducing progression of aortic stenosis severity.  If no significant diastolic  dysfunction, but possibly moderate and symptomatic aortic stenosis, could consider participation in clinical trial PROGRESS or TAVR for symptomatic moderate aortic stenosis.  Coronary artery disease: Stable moderate coronary artery disease s/p RCA CTO PCI in 2017. Continue aspirin  81 mg daily. Known statin intolerance.  LDL well-controlled on next visit, but triglycerides were elevated earlier this year. Repeat fasting lipid panel.  If triglyceride remains elevated, will consider addition of Vascepa.  Carotid artery disease: Moderate asymptomatic carotid artery disease, followed by vascular surgery. Continue medical management including aspirin , statin, irbesartan .  Hypertension: Well-controlled.  Carvedilol  reduced to 12.5 mg twice daily, spironolactone  increased to 25 mg daily.  She is also on amlodipine  10 mg daily and irbesartan   300 mg daily.  Type 2 diabetes mellitus: Historically prediabetic, A1c has ranged in the sevens and eights this year.  Currently on metformin .  She is optimistic that diabetes control would have improved with dietary changes.  A1c is stable.  I will forward the results and notes to her PCP Dr. Garnette Lukes whom she is seeing in 2 weeks.     Meds ordered this encounter  Medications   carvedilol  (COREG ) 12.5 MG tablet    Sig: Take 1 tablet (12.5 mg total) by mouth 2 (two) times daily with a meal.    Dispense:  180 tablet    Refill:  3   spironolactone  (ALDACTONE ) 25 MG tablet    Sig: Take 1 tablet (25 mg total) by mouth daily.    Dispense:  90 tablet    Refill:  1     F/u in 3 months  I spent 45 minutes in the care of [place] H Stecher today including comprehensive review of prior cardiac catheterization, echocardiogram, carotid ultrasound, discussing diagnostic tests to differentiate between worsening aortic stenosis versus HFpEF, medication adjustments as described above, coordinating care with PCP Dr. Garnette Lukes, and documenting in the encounter.   Signed, Newman JINNY Lawrence, MD

## 2024-06-19 ENCOUNTER — Other Ambulatory Visit: Payer: Self-pay | Admitting: Hematology and Oncology

## 2024-06-21 ENCOUNTER — Encounter: Payer: Self-pay | Admitting: Pharmacist

## 2024-06-21 NOTE — Progress Notes (Signed)
 Pharmacy Quality Measure Review  This patient is appearing on a report for being at risk of failing the adherence measure for diabetes medications this calendar year.   Medication: metformin   Last fill date: 02/28/2024 for 90 day supply per insurance report.   Spoke with pharmacist at [facility] to verify pick up - was not picked up until 03/11/2024 so she should be due to refill.   Left a message on patient's VM and then she called me back quickly.  Patient admits that she did not start taking metformin  immediately after it was prescribed. She states  I was in denial that I had diabetes but she did start taking metformin  twice a day as instructed about 1 or 2 months ago. She does not have  a meter to check her blood glucose but she has plans to discuss this with Dr Katrinka at her appointment next week.  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   Patient states she does not need refill yet but will likely need one after she sees Dr Katrinka. Her Rx at [facility] has refill left.   Madelin Ray, PharmD Clinical Pharmacist [facility]  Population Health [phone]

## 2024-06-30 ENCOUNTER — Ambulatory Visit: Admitting: Family Medicine

## 2024-07-04 ENCOUNTER — Telehealth: Payer: Self-pay | Admitting: Cardiology

## 2024-07-04 MED ORDER — NEXLIZET 180-10 MG PO TABS: ORAL_TABLET | ORAL | 3 refills | Status: AC

## 2024-07-04 NOTE — Telephone Encounter (Signed)
*  STAT* If patient is at the pharmacy, call can be transferred to refill team.   1. Which medications need to be refilled? (please list name of each medication and dose if known)   Bempedoic Acid -Ezetimibe  (NEXLIZET ) 180-10 MG TABS     2. Would you like to learn more about the convenience, safety, & potential cost savings by using the [facility]? No   3. Are you open to using the [facility] ([facility]. No   4. Which pharmacy/location (including street and city if local pharmacy) is medication to be sent to? [facility] - [place], [place] - [address]     5. Do they need a 30 day or 90 day supply? 30 day    Pt is out

## 2024-07-04 NOTE — Telephone Encounter (Signed)
 RX sent in

## 2024-07-05 ENCOUNTER — Telehealth: Payer: Self-pay | Admitting: Pharmacy Technician

## 2024-07-05 ENCOUNTER — Telehealth: Payer: Self-pay | Admitting: Cardiology

## 2024-07-05 ENCOUNTER — Other Ambulatory Visit (HOSPITAL_COMMUNITY): Payer: Self-pay

## 2024-07-05 NOTE — Telephone Encounter (Signed)
 Pt called in stating she needs PA for Nexlizet  medication.

## 2024-07-05 NOTE — Telephone Encounter (Signed)
*  STAT* If patient is at the pharmacy, call can be transferred to refill team.   1. Which medications need to be refilled? (please list name of each medication and dose if known)   Bempedoic Acid -Ezetimibe  (NEXLIZET ) 180-10 MG TABS   2. Would you like to learn more about the convenience, safety, & potential cost savings by using the [facility]?   3. Are you open to using the [facility] ([facility]. ).  4. Which pharmacy/location (including street and city if local pharmacy) is medication to be sent to?  [facility] - [place], [place] - [address]   5. Do they need a 30 day or 90 day supply?   Patient stated she is completely out of this medication.  Patient stated her pharmacy told her they have not received the refill and wants the prescription re-sent.

## 2024-07-05 NOTE — Telephone Encounter (Signed)
 Pharmacy Patient Advocate Encounter   Received notification from Pt Calls Messages that prior authorization for Nexlizet  is required/requested.   Insurance verification completed.   The patient is insured through [employer] .   Per test claim: PA required; PA submitted to above mentioned insurance via Latent Key/confirmation #/EOC [id number] Status is pending

## 2024-07-06 ENCOUNTER — Other Ambulatory Visit (HOSPITAL_COMMUNITY): Payer: Self-pay

## 2024-07-06 NOTE — Telephone Encounter (Signed)
 Pharmacy Patient Advocate Encounter  Received notification from [facility] that Prior Authorization for nexlizet   has been APPROVED from 07/06/24 to 07/05/25. Unable to obtain price due to refill too soon rejection, last fill date 07/06/24 next available fill date10/04/25   PA #/Case ID/Reference #: [id number]

## 2024-07-13 ENCOUNTER — Other Ambulatory Visit: Payer: Self-pay | Admitting: Surgery

## 2024-07-13 DIAGNOSIS — N6311 Unspecified lump in the right breast, upper outer quadrant: Secondary | ICD-10-CM

## 2024-07-13 DIAGNOSIS — N6310 Unspecified lump in the right breast, unspecified quadrant: Secondary | ICD-10-CM

## 2024-07-18 ENCOUNTER — Ambulatory Visit (HOSPITAL_COMMUNITY)
Admission: RE | Admit: 2024-07-18 | Discharge: 2024-07-18 | Disposition: A | Discharge status: Home / Self Care | Source: Ambulatory Visit | Attending: Internal Medicine | Admitting: Internal Medicine

## 2024-07-18 DIAGNOSIS — R0609 Other forms of dyspnea: Secondary | ICD-10-CM | POA: Insufficient documentation

## 2024-07-18 LAB — ECHOCARDIOGRAM COMPLETE
AR max vel: 1.52 cm2
AV Area VTI: 1.63 cm2
AV Area mean vel: 1.48 cm2
AV Mean grad: 16.2 mmHg
AV Peak grad: 28.1 mmHg
Ao pk vel: 2.65 m/s
Area-P 1/2: 3.45 cm2
P 1/2 time: 421 ms
S' Lateral: 2.4 cm

## 2024-07-20 ENCOUNTER — Ambulatory Visit: Payer: Self-pay | Admitting: Cardiology

## 2024-07-21 ENCOUNTER — Inpatient Hospital Stay: Attending: Hematology and Oncology | Admitting: Hematology and Oncology

## 2024-07-21 ENCOUNTER — Ambulatory Visit

## 2024-07-21 VITALS — BP 117/69 | HR 78 | Temp 97.9°F | Resp 18 | Wt 155.0 lb

## 2024-07-21 DIAGNOSIS — Z79811 Long term (current) use of aromatase inhibitors: Secondary | ICD-10-CM | POA: Diagnosis not present

## 2024-07-21 DIAGNOSIS — M810 Age-related osteoporosis without current pathological fracture: Secondary | ICD-10-CM | POA: Insufficient documentation

## 2024-07-21 DIAGNOSIS — D0511 Intraductal carcinoma in situ of right breast: Secondary | ICD-10-CM | POA: Insufficient documentation

## 2024-07-21 DIAGNOSIS — Z923 Personal history of irradiation: Secondary | ICD-10-CM | POA: Insufficient documentation

## 2024-07-21 NOTE — Progress Notes (Signed)
 BRIEF ONCOLOGIC HISTORY:  Oncology History  Ductal carcinoma in situ (DCIS) of right breast  12/18/2021 Cancer Staging   Staging form: Breast, AJCC 8th Edition - Clinical stage from 12/18/2021: Stage 0 (cTis (DCIS), cN0, cM0, ER+, PR+, HER2: Not Assessed) - Signed by Loretha Ash, MD on 01/07/2022 Stage prefix: Initial diagnosis Nuclear grade: G2 Laterality: Right Stage used in treatment planning: Yes National guidelines used in treatment planning: Yes Type of national guideline used in treatment planning: NCCN   01/05/2022 Initial Diagnosis   Ductal carcinoma in situ (DCIS) of right breast    Genetic Testing   [facility] CancerNext Panel was Negative. Report date is 01/17/2022.  The CancerNext gene panel offered by [employer] includes sequencing, rearrangement analysis, and RNA analysis for the following 36 genes:   APC, ATM, AXIN2, BARD1, BMPR1A, BRCA1, BRCA2, BRIP1, CDH1, CDK4, CDKN2A, CHEK2, DICER1, HOXB13, EPCAM, GREM1, MLH1, MSH2, MSH3, MSH6, MUTYH, NBN, NF1, NTHL1, PALB2, PMS2, POLD1, POLE, PTEN, RAD51C, RAD51D, RECQL, SMAD4, SMARCA4, STK11, and TP53.    01/22/2022 Surgery   She had right breast lumpectomy pathology showed invasive well-differentiated grade 1 ductal carcinoma, extensive intermediate grade DCIS.  Invasive tumor measured 2 mm in greatest dimension, margins free prognostics on the invasive tumor showed ER +85% moderate staining, progesterone receptor negative, KI of less than 5% and HER2 negative. Oncotype not needed since her tumor is less than 5 mm   01/2022 -  Anti-estrogen oral therapy   Anastrozole    03/04/2022 - 04/02/2022 Radiation Therapy   Site Technique Total Dose (Gy) Dose per Fx (Gy) Completed Fx Beam Energies  Breast, Right: Breast_R 3D 40.05/40.05 2.67 15/15 10X, 6XFFF  Breast, Right: Breast_R_Bst 3D 12/12 2 6/6 6X, 10X       INTERVAL HISTORY History of Present Illness    Discussed the use of AI scribe software for clinical note transcription with  the patient, who gave verbal consent to proceed.  History of Present Illness Felicia Perry is a 78 year old female with diabetes and breast cancer who presents with concerns about a new breast lump.  She discovered a new tender lump in her breast last week, located near the site of a previous lump. The lump is tender. She is scheduled for a diagnostic mammogram and ultrasound.  She has recently started on metformin  for diabetes, which has caused significant gastrointestinal side effects, specifically diarrhea. She is frustrated about her diabetes diagnosis, noting that she eats healthily and has been trying to manage her condition. Her hemoglobin A1c levels have been elevated for a couple of years, with a reading of 7.5% in October 2022 and 6.7% in April 2022. In July 2024, her levels improved to 6.5%, which she attributes to dietary changes.  She continues to take vitamin D  and maintains an active lifestyle by walking regularly. She is still working and generally feels well, aside from the issues with metformin .   Rest of the pertinent 10 point ROS reviewed and negative    ONCOLOGY TREATMENT TEAM:  1. Surgeon:  Dr. Vanderbilt at [facility] 2. Medical Oncologist: Dr. Loretha  3. Radiation Oncologist: Dr. Shannon    PAST MEDICAL/SURGICAL HISTORY:  Past Medical History:  Diagnosis Date   Adenomatous polyp 11/23/2005   Anxiety    01/09/22- patient denies anxiety   Asthma 2003   Basal cell carcinoma 2021   right cheek per patient at [place] [facility]   Carotid stenosis    Chronic diastolic CHF (congestive heart failure) (HCC) 2017  Echo 2021 was normal   COPD (chronic obstructive pulmonary disease) (HCC)    Coronary artery disease 2017   a. HC on 08/14/16 showed RCA CTO s/p PCI, 60% LCX disease managed medically with recs to consider staged PCI if continued symptoms.    Depression 2008   pt reports MD diagnosed but she does not feel depressed   Diabetes mellitus  without complication (HCC)    DIVERTICULOSIS, COLON 04/22/2007        GERD (gastroesophageal reflux disease) 2012   History of radiation therapy    Right breast- 03/04/22-04/02/22- Dr. Lynwood Nasuti   History of shingles    Hypertension 2008   Hypertensive heart disease    Insomnia 2003   Internal hemorrhoids    Osteoarthritis 2008   knees, hands, lower back (08/14/2016)   Pap smear for cervical cancer screening 2002   results normal   Personal history of radiation therapy    PUD (peptic ulcer disease) 1989   Seasonal allergies 2013   Sleep apnea    a. resolved with weight loss   Past Surgical History:  Procedure Laterality Date   BREAST BIOPSY Right 12/30/2021   BREAST EXCISIONAL BIOPSY Left    1980's x 3 - all benign   BREAST EXCISIONAL BIOPSY Right    1980's x 1 or 2 - all benign   BREAST LUMPECTOMY Right 01/22/2022   BREAST LUMPECTOMY WITH RADIOACTIVE SEED LOCALIZATION Right 01/22/2022   Procedure: RIGHT BREAST LUMPECTOMY WITH RADIOACTIVE SEED LOCALIZATION;  Surgeon: Vanderbilt Ned, MD;  Location: [facility];  Service: General;  Laterality: Right;   BREAST SURGERY Left 1980s   Fibrous Tumors removed    CARDIAC CATHETERIZATION N/A 08/14/2016   Procedure: Left Heart Cath and Coronary Angiography;  Surgeon: Ozell Fell, MD;  Location[facility] INVASIVE CV LAB;  Service: Cardiovascular;  Laterality: N/A;   CARDIAC CATHETERIZATION N/A 08/14/2016   Procedure: Coronary Stent Intervention;  Surgeon: Ozell Fell, MD;  Location: [facility] INVASIVE CV LAB;  Service: Cardiovascular;  Laterality: N/A;   CATARACT EXTRACTION W/ INTRAOCULAR LENS  IMPLANT, BILATERAL Bilateral 05/2009   COLONOSCOPY N/A 2016   Cologuard 2019   CORONARY ANGIOPLASTY     detached retina left eye     may 2019   LEFT HEART CATH AND CORONARY ANGIOGRAPHY N/A 09/25/2022   Procedure: LEFT HEART CATH AND CORONARY ANGIOGRAPHY;  Surgeon: Verlin Lonni BIRCH, MD;  Location: [facility] INVASIVE CV LAB;  Service:  Cardiovascular;  Laterality: N/A;   TUBAL LIGATION  1970s     ALLERGIES:  Allergies  Allergen Reactions   Penicillins Anaphylaxis    REACTION: per patient causes rash,hives   Cephalosporins Swelling    REACTION: tongue swelling   Citalopram  Hydrobromide Other (See Comments)    psychosis   Rosuvastatin  Other (See Comments)    Pt reports causes bilateral lower extremity muscle aches.    Zolpidem Tartrate     REACTION: difficulty with concentration   Clarithromycin Rash    REACTION: blisters in mouth   Levofloxacin Rash    REACTION: tongue swells     CURRENT MEDICATIONS:  Outpatient Encounter Medications as of 07/21/2024  Medication Sig   acetaminophen  (TYLENOL ) 325 MG tablet Take 650 mg by mouth every 6 (six) hours as needed for mild pain, moderate pain or headache.   albuterol  (VENTOLIN  HFA) 108 (90 Base) MCG/ACT inhaler Inhale 2 puffs into the lungs every 6 (six) hours as needed for wheezing or shortness of breath.   ALPRAZolam  (XANAX ) 1 MG tablet TAKE  ONE TABLET BY MOUTH AT BEDTIME AS NEEDED FOR SLEEP -do not drive FOR EIGHT hours AFTER taking   amLODipine  (NORVASC ) 10 MG tablet Take 1 tablet (10 mg total) by mouth daily.   anastrozole  (ARIMIDEX ) 1 MG tablet TAKE ONE TABLET DAILY   aspirin  EC 81 MG tablet Take 1 tablet (81 mg total) by mouth daily. Swallow whole.   Bempedoic Acid -Ezetimibe  (NEXLIZET ) 180-10 MG TABS Take 180 mg by mouth daily.   budesonide -formoterol  (SYMBICORT ) 160-4.5 MCG/ACT inhaler Inhale 2 puffs into the lungs 2 (two) times daily.   carvedilol  (COREG ) 12.5 MG tablet Take 1 tablet (12.5 mg total) by mouth 2 (two) times daily with a meal.   famotidine  (PEPCID ) 20 MG tablet TAKE ONE TABLET TWICE DAILY   irbesartan  (AVAPRO ) 300 MG tablet Take 1 tablet (300 mg total) by mouth daily.   Ketoconazole -Hydrocortisone  2-2.5 % CREA Apply twice daily.   levocetirizine (XYZAL ) 5 MG tablet TAKE ONE TABLET BY MOUTH EVERY MORNING   metFORMIN  (GLUCOPHAGE ) 1000 MG tablet  Take 1 tablet (1,000 mg total) by mouth 2 (two) times daily with a meal.   omega-3 acid ethyl esters (LOVAZA ) 1 g capsule TAKE TWO CAPSULES EVERY DAY   spironolactone  (ALDACTONE ) 25 MG tablet Take 1 tablet (25 mg total) by mouth daily.   venlafaxine  XR (EFFEXOR -XR) 75 MG 24 hr capsule TAKE ONE CAPSULE DAILY WITH BREAKFAST   Vitamin D , Ergocalciferol , (DRISDOL ) 1.25 MG (50000 UNIT) CAPS capsule Take 1 capsule (50,000 Units total) by mouth every 7 (seven) days.   No facility-administered encounter medications on file as of 07/21/2024.     ONCOLOGIC FAMILY HISTORY:  Family History  Problem Relation Age of Onset   Heart disease Mother    Hypertension Mother    Diabetes Mother    Dementia Mother    COPD Father    Dementia Maternal Grandmother    Colon cancer Neg Hx      SOCIAL HISTORY:  Social History   Socioeconomic History   Marital status: Divorced    Spouse name: Not on file   Number of children: Not on file   Years of education: Not on file   Highest education level: Associate degree: occupational, Scientist, product/process development, or vocational program  Occupational History   Occupation: Cleaning  Tobacco Use   Smoking status: Former    Current packs/day: 0.00    Average packs/day: 1 pack/day for 26.0 years (26.0 ttl pk-yrs)    Types: Cigarettes    Start date: 1963    Quit date: 1989    Years since quitting: 36.7   Smokeless tobacco: Never  Vaping Use   Vaping status: Never Used  Substance and Sexual Activity   Alcohol use: No   Drug use: No   Sexual activity: Not Currently  Other Topics Concern   Not on file  Social History Narrative   Divorced for 38 years in 2016. 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    Social Drivers of Health   Financial Resource Strain: Low Risk  (04/12/2024)   Overall Financial Resource Strain (CARDIA)    Difficulty of Paying Living Expenses: Not  hard at all  Food Insecurity: No Food Insecurity (04/12/2024)   Hunger Vital Sign    Worried About Running Out of Food in the Last Year: Never true    Ran Out of Food in the Last Year: Never true  Transportation Needs: No Transportation Needs (04/12/2024)   PRAPARE - Administrator, Civil Service (Medical): No    Lack of Transportation (Non-Medical): No  Physical Activity: Sufficiently Active (04/12/2024)   Exercise Vital Sign    Days of Exercise per Week: 3 days    Minutes of Exercise per Session: 60 min  Stress: No Stress Concern Present (04/12/2024)   [name] of Occupational Health - Occupational Stress Questionnaire    Feeling of Stress: Only a little  Social Connections: Moderately Integrated (04/12/2024)   Social Connection and Isolation Panel    Frequency of Communication with Friends and Family: More than three times a week    Frequency of Social Gatherings with Friends and Family: More than three times a week    Attends Religious Services: More than 4 times per year    Active Member of [employer] or Organizations: Yes    Attends Banker Meetings: 1 to 4 times per year    Marital Status: Divorced  Catering manager Violence: Not At Risk (04/12/2024)   Humiliation, Afraid, Rape, and Kick questionnaire    Fear of Current or Ex-Partner: No    Emotionally Abused: No    Physically Abused: No    Sexually Abused: No     OBSERVATIONS/OBJECTIVE:  BP 117/69 (BP Location: Left Arm, Patient Position: Sitting)   Pulse 78   Temp 97.9 F (36.6 C) (Oral)   Resp 18   Wt 155 lb (70.3 kg)   SpO2 98%   BMI 30.27 kg/m   GENERAL: Patient is a well appearing female in no acute distress Skin: Scattered ecchymosis on forearms Breast: Bilateral breasts  inspected and palpated. Right below the breast surgical scar, there is a nodule which measured just about 1 cm. No other palpable masses No regional adenopathy  LABORATORY DATA:  None for this visit.  DIAGNOSTIC IMAGING:  None for this visit.   ASSESSMENT AND PLAN:   Ms.. Ahart is a pleasant 78 y.o. female with Stage 0 right breast DCIS, ER+/PR+, diagnosed in February 2023, treated with lumpectomy, adjuvant radiation therapy, and anti-estrogen therapy with anastrozole  beginning in October 2023.  Last mammogram in January 2024 with no evidence of recurrence, no evidence of malignancy in either breast. Last bone density scan which showed osteopenia with a T-score of -2.4, very close to being osteoporotic. She is now on adjuvant anastrozole .  Assessment and Plan Assessment & Plan Right breast lump New tender lump at previous site, likely scar tissue or benign changes.  - Diagnostic mammogram and ultrasound on October 3rd. - Call with results on October 6th or earlier if available. - Continue anastrozole  in the interim.  Type 2 diabetes mellitus Managed with metformin  causing diarrhea. Hemoglobin A1c fluctuates, indicating variable glycemic control. - Continue metformin . - Encourage dietary management to improve glycemic control. - Discuss alternative medications if metformin  side effects persist.   Return to clinic in 6 months or sooner as needed.   Total encounter time:30 minutes*in face-to-face visit time, chart review, lab review, care coordination, order entry, and documentation of the encounter time.  *Total Encounter Time as defined by the Centers for Medicare and Medicaid Services includes, in addition to the face-to-face time of a patient visit (documented in  the note above) non-face-to-face time: obtaining and reviewing outside history, ordering and reviewing medications, tests or procedures, care coordination (communications with other health care professionals or  caregivers) and documentation in the medical record.

## 2024-07-23 NOTE — Progress Notes (Signed)
 Normal heart function, mild to moderate narrowing of mitral valve. Will discuss more during office visit in a few weeks.  Thanks MJP

## 2024-07-25 ENCOUNTER — Ambulatory Visit: Admitting: Family Medicine

## 2024-07-28 ENCOUNTER — Encounter

## 2024-07-28 ENCOUNTER — Other Ambulatory Visit

## 2024-07-28 ENCOUNTER — Telehealth: Payer: Self-pay

## 2024-07-28 DIAGNOSIS — D4861 Neoplasm of uncertain behavior of right breast: Secondary | ICD-10-CM | POA: Diagnosis not present

## 2024-07-28 NOTE — Progress Notes (Signed)
 PROVIDER:  DEBBY CURTISTINE SHIPPER, MD  MRN: [id number] DOB: July 31, 1946 DATE OF ENCOUNTER: 07/28/2024 Subjective     Chief Complaint: Follow-up     History of Present Illness: Felicia Perry is a 78 y.o. female who is seen today for right breast mass. Felicia Perry is a 78 y.o. female who is seen today for long-term follow-up after history of right breast DCIS treated with radiation therapy and surgery in March 2023.  Her most recent mammogram was last January .It was BIRADS 2  The patient has developed a mass on the right lateral breast.  It is tender.  This has been over the last 3 to 4 months    Review of Systems: A complete review of systems was obtained from the patient.  I have reviewed this information and discussed as appropriate with the patient.  See HPI as well for other ROS.      Medical History: Past Medical History:  Diagnosis Date  . Arthritis   . Asthma, unspecified asthma severity, unspecified whether complicated, unspecified whether persistent (HHS-HCC)   . COPD (chronic obstructive pulmonary disease) (CMS/HHS-HCC)   . GERD (gastroesophageal reflux disease)   . Glaucoma (increased eye pressure)   . Hypertension     Patient Active Problem List  Diagnosis  . Ductal carcinoma in situ (DCIS) of right breast  . Essential hypertension  . GERD (gastroesophageal reflux disease)  . Diabetes mellitus type 2 with complications (CMS/HHS-HCC)    Past Surgical History:  Procedure Laterality Date  . Cardiac Catheterization N/A    08/14/2016  . MASTECTOMY PARTIAL / LUMPECTOMY       Allergies  Allergen Reactions  . Penicillins Anaphylaxis    REACTION: per patient causes rash,hives   . Cephalosporins Swelling    REACTION: tongue swelling  . Citalopram  Hydrobromide Other (See Comments)    psychosis  . Doxycycline  Hyclate Nausea    REACTION: nausea, vomiting  . Rosuvastatin  Other (See Comments)    Pt reports causes bilateral lower extremity muscle aches.   .  Zolpidem Tartrate Other (See Comments)    REACTION: difficulty with concentration  . Clarithromycin Rash    REACTION: blisters in mouth   . Levofloxacin Rash    REACTION: tongue swells     Current Outpatient Medications on File Prior to Visit  Medication Sig Dispense Refill  . acetaminophen  (TYLENOL ) 325 MG tablet Take 650 mg by mouth every 6 (six) hours as needed    . albuterol  90 mcg/actuation inhaler Inhale into the lungs    . ALPRAZolam  (XANAX ) 1 MG tablet TAKE ONE TABLET AT BEDTIME AS NEEDED FORSLEEP -DO NOT DRIVE FOR 8 HOURS AFTER TAKING    . amLODIPine  (NORVASC ) 10 MG tablet Take 1 tablet by mouth once daily    . anastrozole  (ARIMIDEX ) 1 mg tablet Take 1 tablet by mouth once daily    . aspirin  81 MG EC tablet Take by mouth    . bempedoic acid -ezetimibe  (NEXLIZET ) 180-10 mg Tab Take by mouth    . brimonidine (ALPHAGAN) 0.2 % ophthalmic solution Apply 1 drop to eye 2 (two) times daily    . budesonide -formoteroL  (SYMBICORT ) 160-4.5 mcg/actuation inhaler Inhale into the lungs    . carvediloL  (COREG ) 12.5 MG tablet Take 6.25 mg by mouth 2 (two) times daily    . famotidine  (PEPCID ) 20 MG tablet Take 1 tablet by mouth 2 (two) times daily    . irbesartan  (AVAPRO ) 300 MG tablet Take 1 tablet by mouth once daily    .  levocetirizine (XYZAL ) 5 MG tablet Take 1 tablet by mouth every morning    . NEXLIZET  180-10 mg Tab TAKE ONE TABLET EACH DAY    . omega-3 acid ethyl esters (LOVAZA ) 1 gram capsule TAKE TWO CAPSULES EVERY DAY    . oxyCODONE -acetaminophen  (PERCOCET) 5-325 mg tablet Take by mouth    . prednisoLONE acetate (PRED FORTE) 1 % ophthalmic suspension Apply 1 drop to eye 4 (four) times daily    . spironolactone  (ALDACTONE ) 25 MG tablet Take 12.5 mg by mouth once daily    . venlafaxine  (EFFEXOR -XR) 75 MG XR capsule TAKE ONE CAPSULE EACH DAY WITH BREAKFAST     No current facility-administered medications on file prior to visit.    Family History  Problem Relation Age of Onset  .  Obesity Father   . High blood pressure (Hypertension) Father   . Hyperlipidemia (Elevated cholesterol) Father   . Diabetes Father      Social History   Tobacco Use  Smoking Status Former  . Current packs/day: 0.00  . Types: Cigarettes  . Quit date: 110  . Years since quitting: 36.7  Smokeless Tobacco Never     Social History   Socioeconomic History  . Marital status: Unknown  Tobacco Use  . Smoking status: Former    Current packs/day: 0.00    Types: Cigarettes    Quit date: 1989    Years since quitting: 36.7  . Smokeless tobacco: Never  Vaping Use  . Vaping status: Never Used  Substance and Sexual Activity  . Alcohol use: Not Currently  . Drug use: Never   Social Drivers of Corporate investment banker Strain: Low Risk  (04/12/2024)   Received from [facility]   Overall Financial Resource Strain (CARDIA)   . How hard is it for you to pay for the very basics like food, housing, medical care, and heating?: Not hard at all  Food Insecurity: No Food Insecurity (04/12/2024)   Received from [facility]   Hunger Vital Sign   . Within the past 12 months, you worried that your food would run out before you got the money to buy more.: Never true   . Within the past 12 months, the food you bought just didn't last and you didn't have money to get more.: Never true  Transportation Needs: No Transportation Needs (04/12/2024)   Received from [facility] - Transportation   . In the past 12 months, has lack of transportation kept you from medical appointments or from getting medications?: No   . In the past 12 months, has lack of transportation kept you from meetings, work, or from getting things needed for daily living?: No  Physical Activity: Sufficiently Active (04/12/2024)   Received from [facility]   Exercise Vital Sign   . On average, how many days per week do you engage in moderate to strenuous exercise (like a brisk walk)?: 3 days   . On average, how many minutes  do you engage in exercise at this level?: 60 min  Stress: No Stress Concern Present (04/12/2024)   Received from [facility] - Occupational Stress Questionnaire   . Do you feel stress - tense, restless, nervous, or anxious, or unable to sleep at night because your mind is troubled all the time - these days?: Only a little  Social Connections: Moderately Integrated (04/12/2024)   Received from [facility]   Social Connection and Isolation Panel   .  In a typical week, how many times do you talk on the phone with family, friends, or neighbors?: More than three times a week   . How often do you get together with friends or relatives?: More than three times a week   . How often do you attend church or religious services?: More than 4 times per year   . Do you belong to any clubs or organizations such as church groups, unions, fraternal or athletic groups, or school groups?: Yes   . How often do you attend meetings of the clubs or organizations you belong to?: 1 to 4 times per year   . Are you married, widowed, divorced, separated, never married, or living with a partner?: Divorced  Housing Stability: Unknown (11/08/2023)   Housing Stability Vital Sign   . Homeless in the Last Year: No    Objective:    Vitals:   07/28/24 1031  PainSc: 0-No pain  PainLoc: Breast    There is no height or weight on file to calculate BMI.  Physical Exam Exam conducted with a chaperone present (brooks).  Cardiovascular:     Rate and Rhythm: Normal rate.  Pulmonary:     Effort: Pulmonary effort is normal.  Chest:  Breasts:    Left: Normal.       Comments: Mobile tender 2 cm right breast mass near scar. Lymphadenopathy:     Upper Body:     Right upper body: No axillary adenopathy.     Left upper body: No axillary adenopathy.         Labs, Imaging and Diagnostic Testing:   CLINICAL DATA:  Status post right lumpectomy for breast carcinoma, performed in  March 2023, with adjuvant radiation therapy. Annual mammographic surveillance.  EXAM: DIGITAL DIAGNOSTIC BILATERAL MAMMOGRAM WITH TOMOSYNTHESIS AND CAD  TECHNIQUE: Bilateral digital diagnostic mammography and breast tomosynthesis was performed. The images were evaluated with computer-aided detection.  COMPARISON:  Previous exam(s).  ACR Breast Density Category b: There are scattered areas of fibroglandular density.  FINDINGS: Stable post lumpectomy changes in the posterior, lateral right breast.  There are no masses, areas of significant asymmetry, areas of nonsurgical architectural distortion or suspicious calcifications.  IMPRESSION: 1. No evidence of new or recurrent breast carcinoma. 2. Benign post lumpectomy changes on the right.  RECOMMENDATION: Screening mammogram in one year.(Code:SM-B-01Y)  I have discussed the findings and recommendations with the patient. If applicable, a reminder letter will be sent to the patient regarding the next appointment.  BI-RADS CATEGORY  2: Benign.   Electronically Signed   By: Alm Parkins M.D.   On: 11/22/2023 08:03 Procedure Note  Parkins Alm, MD - 11/22/2023 Formatting of this note might be different from the original. CLINICAL DATA:  Status post right lumpectomy for breast carcinoma, performed in March 2023, with adjuvant radiation therapy. Annual mammographic surveillance.  EXAM: DIGITAL DIAGNOSTIC BILATERAL MAMMOGRAM WITH TOMOSYNTHESIS AND CAD  TECHNIQUE: Bilateral digital diagnostic mammography and breast tomosynthesis was performed. The images were evaluated with computer-aided detection.  COMPARISON:  Previous exam(s).  ACR Breast Density Category b: There are scattered areas of fibroglandular density.  FINDINGS: Stable post lumpectomy changes in the posterior, lateral right breast.  There are no masses, areas of significant asymmetry, areas of nonsurgical architectural distortion or suspicious  calcifications.  IMPRESSION: 1. No evidence of new or recurrent breast carcinoma. 2. Benign post lumpectomy changes on the right.  RECOMMENDATION: Screening mammogram in one year.(Code:SM-B-01Y)  I have discussed the findings and recommendations with the patient.  If applicable, a reminder letter will be sent to the patient regarding the next appointment.  BI-RADS CATEGORY  2: Benign.  Assessment and Plan:     Diagnoses and all orders for this visit:  Mass of right breast, unspecified quadrant     History of DCIS status post right breast lumpectomy 2023  Obtain right breast ultrasound to evaluate right breast mass.  Further plan of care after imaging complete.  No follow-ups on file.   DEBBY CURTISTINE SHIPPER, MD    I had direct face-to-face contact with the patient for a total of 22  minutes and greater than 50% of that time was spent providing counseling and/or coordination of care for the patient regardingright breast mass .

## 2024-07-28 NOTE — Telephone Encounter (Signed)
 Left message for patient about upcoming appointment on 10/6

## 2024-07-31 ENCOUNTER — Inpatient Hospital Stay: Attending: Hematology and Oncology | Admitting: Hematology and Oncology

## 2024-07-31 VITALS — BP 108/76 | HR 72 | Temp 97.6°F | Resp 16 | Wt 155.5 lb

## 2024-07-31 DIAGNOSIS — N6489 Other specified disorders of breast: Secondary | ICD-10-CM | POA: Diagnosis not present

## 2024-07-31 DIAGNOSIS — Z923 Personal history of irradiation: Secondary | ICD-10-CM | POA: Insufficient documentation

## 2024-07-31 DIAGNOSIS — D0511 Intraductal carcinoma in situ of right breast: Secondary | ICD-10-CM | POA: Diagnosis not present

## 2024-07-31 DIAGNOSIS — Z87891 Personal history of nicotine dependence: Secondary | ICD-10-CM | POA: Diagnosis not present

## 2024-07-31 DIAGNOSIS — M810 Age-related osteoporosis without current pathological fracture: Secondary | ICD-10-CM | POA: Diagnosis not present

## 2024-07-31 DIAGNOSIS — Z79811 Long term (current) use of aromatase inhibitors: Secondary | ICD-10-CM | POA: Insufficient documentation

## 2024-07-31 NOTE — Progress Notes (Signed)
 BRIEF ONCOLOGIC HISTORY:  Oncology History  Ductal carcinoma in situ (DCIS) of right breast  12/18/2021 Cancer Staging   Staging form: Breast, AJCC 8th Edition - Clinical stage from 12/18/2021: Stage 0 (cTis (DCIS), cN0, cM0, ER+, PR+, HER2: Not Assessed) - Signed by Loretha Ash, MD on 01/07/2022 Stage prefix: Initial diagnosis Nuclear grade: G2 Laterality: Right Stage used in treatment planning: Yes National guidelines used in treatment planning: Yes Type of national guideline used in treatment planning: NCCN   01/05/2022 Initial Diagnosis   Ductal carcinoma in situ (DCIS) of right breast    Genetic Testing   [facility] CancerNext Panel was Negative. Report date is 01/17/2022.  The CancerNext gene panel offered by [employer] includes sequencing, rearrangement analysis, and RNA analysis for the following 36 genes:   APC, ATM, AXIN2, BARD1, BMPR1A, BRCA1, BRCA2, BRIP1, CDH1, CDK4, CDKN2A, CHEK2, DICER1, HOXB13, EPCAM, GREM1, MLH1, MSH2, MSH3, MSH6, MUTYH, NBN, NF1, NTHL1, PALB2, PMS2, POLD1, POLE, PTEN, RAD51C, RAD51D, RECQL, SMAD4, SMARCA4, STK11, and TP53.    01/22/2022 Surgery   She had right breast lumpectomy pathology showed invasive well-differentiated grade 1 ductal carcinoma, extensive intermediate grade DCIS.  Invasive tumor measured 2 mm in greatest dimension, margins free prognostics on the invasive tumor showed ER +85% moderate staining, progesterone receptor negative, KI of less than 5% and HER2 negative. Oncotype not needed since her tumor is less than 5 mm   01/2022 -  Anti-estrogen oral therapy   Anastrozole    03/04/2022 - 04/02/2022 Radiation Therapy   Site Technique Total Dose (Gy) Dose per Fx (Gy) Completed Fx Beam Energies  Breast, Right: Breast_R 3D 40.05/40.05 2.67 15/15 10X, 6XFFF  Breast, Right: Breast_R_Bst 3D 12/12 2 6/6 6X, 10X       INTERVAL HISTORY History of Present Illness    Discussed the use of AI scribe software for clinical note transcription with  the patient, who gave verbal consent to proceed.  History of Present Illness  Felicia Perry is a 78 year old female with breast cancer who presents with a right breast mass. She has a tender, mobile lump in her right breast, first noted on July 15, 2024. The mass is approximately two centimeters in size and located near a surgical scar. Initially, the area was sore, and the patient reports she had been touching it frequently, but she has since left it alone and reports no current pain or problems associated with it.  She is currently taking anastrozole  and confirms adherence to this medication regimen. She has not experienced any new symptoms or changes in the mass since her last visit.  She is concerned about the possibility of recurrence but has not noticed any significant changes in the mass.   Rest of the pertinent 10 point ROS reviewed and negative  ONCOLOGY TREATMENT TEAM:  1. Surgeon:  Dr. Vanderbilt at [facility] 2. Medical Oncologist: Dr. Loretha  3. Radiation Oncologist: Dr. Shannon    PAST MEDICAL/SURGICAL HISTORY:  Past Medical History:  Diagnosis Date   Adenomatous polyp 11/23/2005   Anxiety    01/09/22- patient denies anxiety   Asthma 2003   Basal cell carcinoma 2021   right cheek per patient at [place] [facility]   Carotid stenosis    Chronic diastolic CHF (congestive heart failure) (HCC) 2017   Echo 2021 was normal   COPD (chronic obstructive pulmonary disease) (HCC)    Coronary artery disease 2017   a. HC on 08/14/16 showed RCA CTO s/p PCI, 60% LCX disease managed  medically with recs to consider staged PCI if continued symptoms.    Depression 2008   pt reports MD diagnosed but she does not feel depressed   Diabetes mellitus without complication (HCC)    DIVERTICULOSIS, COLON 04/22/2007        GERD (gastroesophageal reflux disease) 2012   History of radiation therapy    Right breast- 03/04/22-04/02/22- Dr. Lynwood Nasuti   History of  shingles    Hypertension 2008   Hypertensive heart disease    Insomnia 2003   Internal hemorrhoids    Osteoarthritis 2008   knees, hands, lower back (08/14/2016)   Pap smear for cervical cancer screening 2002   results normal   Personal history of radiation therapy    PUD (peptic ulcer disease) 1989   Seasonal allergies 2013   Sleep apnea    a. resolved with weight loss   Past Surgical History:  Procedure Laterality Date   BREAST BIOPSY Right 12/30/2021   BREAST EXCISIONAL BIOPSY Left    1980's x 3 - all benign   BREAST EXCISIONAL BIOPSY Right    1980's x 1 or 2 - all benign   BREAST LUMPECTOMY Right 01/22/2022   BREAST LUMPECTOMY WITH RADIOACTIVE SEED LOCALIZATION Right 01/22/2022   Procedure: RIGHT BREAST LUMPECTOMY WITH RADIOACTIVE SEED LOCALIZATION;  Surgeon: Vanderbilt Ned, MD;  Location: [facility];  Service: General;  Laterality: Right;   BREAST SURGERY Left 1980s   Fibrous Tumors removed    CARDIAC CATHETERIZATION N/A 08/14/2016   Procedure: Left Heart Cath and Coronary Angiography;  Surgeon: Ozell Fell, MD;  Location: [facility] INVASIVE CV LAB;  Service: Cardiovascular;  Laterality: N/A;   CARDIAC CATHETERIZATION N/A 08/14/2016   Procedure: Coronary Stent Intervention;  Surgeon: Ozell Fell, MD;  Location: [facility] INVASIVE CV LAB;  Service: Cardiovascular;  Laterality: N/A;   CATARACT EXTRACTION W/ INTRAOCULAR LENS  IMPLANT, BILATERAL Bilateral 05/2009   COLONOSCOPY N/A 2016   Cologuard 2019   CORONARY ANGIOPLASTY     detached retina left eye     may 2019   LEFT HEART CATH AND CORONARY ANGIOGRAPHY N/A 09/25/2022   Procedure: LEFT HEART CATH AND CORONARY ANGIOGRAPHY;  Surgeon: Verlin Lonni BIRCH, MD;  Location: [facility] INVASIVE CV LAB;  Service: Cardiovascular;  Laterality: N/A;   TUBAL LIGATION  1970s     ALLERGIES:  Allergies  Allergen Reactions   Penicillins Anaphylaxis    REACTION: per patient causes rash,hives   Cephalosporins Swelling     REACTION: tongue swelling   Citalopram  Hydrobromide Other (See Comments)    psychosis   Rosuvastatin  Other (See Comments)    Pt reports causes bilateral lower extremity muscle aches.    Zolpidem Tartrate     REACTION: difficulty with concentration   Clarithromycin Rash    REACTION: blisters in mouth   Levofloxacin Rash    REACTION: tongue swells     CURRENT MEDICATIONS:  Outpatient Encounter Medications as of 07/31/2024  Medication Sig   acetaminophen  (TYLENOL ) 325 MG tablet Take 650 mg by mouth every 6 (six) hours as needed for mild pain, moderate pain or headache.   albuterol  (VENTOLIN  HFA) 108 (90 Base) MCG/ACT inhaler Inhale 2 puffs into the lungs every 6 (six) hours as needed for wheezing or shortness of breath.   ALPRAZolam  (XANAX ) 1 MG tablet TAKE ONE TABLET BY MOUTH AT BEDTIME AS NEEDED FOR SLEEP -do not drive FOR EIGHT hours AFTER taking   amLODipine  (NORVASC ) 10 MG tablet Take 1 tablet (10 mg total) by mouth daily.  anastrozole  (ARIMIDEX ) 1 MG tablet TAKE ONE TABLET DAILY   aspirin  EC 81 MG tablet Take 1 tablet (81 mg total) by mouth daily. Swallow whole.   Bempedoic Acid -Ezetimibe  (NEXLIZET ) 180-10 MG TABS Take 180 mg by mouth daily.   budesonide -formoterol  (SYMBICORT ) 160-4.5 MCG/ACT inhaler Inhale 2 puffs into the lungs 2 (two) times daily.   carvedilol  (COREG ) 12.5 MG tablet Take 1 tablet (12.5 mg total) by mouth 2 (two) times daily with a meal.   famotidine  (PEPCID ) 20 MG tablet TAKE ONE TABLET TWICE DAILY   irbesartan  (AVAPRO ) 300 MG tablet Take 1 tablet (300 mg total) by mouth daily.   Ketoconazole -Hydrocortisone  2-2.5 % CREA Apply twice daily.   levocetirizine (XYZAL ) 5 MG tablet TAKE ONE TABLET BY MOUTH EVERY MORNING   metFORMIN  (GLUCOPHAGE ) 1000 MG tablet Take 1 tablet (1,000 mg total) by mouth 2 (two) times daily with a meal.   omega-3 acid ethyl esters (LOVAZA ) 1 g capsule TAKE TWO CAPSULES EVERY DAY   spironolactone  (ALDACTONE ) 25 MG tablet Take 1 tablet (25 mg  total) by mouth daily.   venlafaxine  XR (EFFEXOR -XR) 75 MG 24 hr capsule TAKE ONE CAPSULE DAILY WITH BREAKFAST   Vitamin D , Ergocalciferol , (DRISDOL ) 1.25 MG (50000 UNIT) CAPS capsule Take 1 capsule (50,000 Units total) by mouth every 7 (seven) days.   No facility-administered encounter medications on file as of 07/31/2024.     ONCOLOGIC FAMILY HISTORY:  Family History  Problem Relation Age of Onset   Heart disease Mother    Hypertension Mother    Diabetes Mother    Dementia Mother    COPD Father    Dementia Maternal Grandmother    Colon cancer Neg Hx      SOCIAL HISTORY:  Social History   Socioeconomic History   Marital status: Divorced    Spouse name: Not on file   Number of children: Not on file   Years of education: Not on file   Highest education level: Associate degree: occupational, Scientist, product/process development, or vocational program  Occupational History   Occupation: Cleaning  Tobacco Use   Smoking status: Former    Current packs/day: 0.00    Average packs/day: 1 pack/day for 26.0 years (26.0 ttl pk-yrs)    Types: Cigarettes    Start date: 1963    Quit date: 1989    Years since quitting: 36.7   Smokeless tobacco: Never  Vaping Use   Vaping status: Never Used  Substance and Sexual Activity   Alcohol use: No   Drug use: No   Sexual activity: Not Currently  Other Topics Concern   Not on file  Social History Narrative   Divorced for 38 years in 2016. 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    Social Drivers of Corporate investment banker Strain: Low Risk  (04/12/2024)   Overall Financial Resource Strain (CARDIA)    Difficulty of Paying Living Expenses: Not  hard at all  Food Insecurity: No Food Insecurity (04/12/2024)   Hunger Vital Sign    Worried About Running Out of Food in the Last Year: Never true    Ran Out of  Food in the Last Year: Never true  Transportation Needs: No Transportation Needs (04/12/2024)   PRAPARE - Administrator, Civil Service (Medical): No    Lack of Transportation (Non-Medical): No  Physical Activity: Sufficiently Active (04/12/2024)   Exercise Vital Sign    Days of Exercise per Week: 3 days    Minutes of Exercise per Session: 60 min  Stress: No Stress Concern Present (04/12/2024)   [name] of Occupational Health - Occupational Stress Questionnaire    Feeling of Stress: Only a little  Social Connections: Moderately Integrated (04/12/2024)   Social Connection and Isolation Panel    Frequency of Communication with Friends and Family: More than three times a week    Frequency of Social Gatherings with Friends and Family: More than three times a week    Attends Religious Services: More than 4 times per year    Active Member of [employer] or Organizations: Yes    Attends Banker Meetings: 1 to 4 times per year    Marital Status: Divorced  Catering manager Violence: Not At Risk (04/12/2024)   Humiliation, Afraid, Rape, and Kick questionnaire    Fear of Current or Ex-Partner: No    Emotionally Abused: No    Physically Abused: No    Sexually Abused: No     OBSERVATIONS/OBJECTIVE:  BP 108/76 (BP Location: Left Arm, Patient Position: Sitting)   Pulse 72   Temp 97.6 F (36.4 C) (Temporal)   Resp 16   Wt 155 lb 8 oz (70.5 kg)   SpO2 96%   BMI 30.37 kg/m   GENERAL: Patient is a well appearing female in no acute distress Skin: Scattered ecchymosis on forearms Breast: Bilateral breasts inspected and palpated. Right below the breast surgical scar, there is a nodule which measured just about 1 cm. This is fluctuant and is more than likely post op seroma. No regional adenopathy  LABORATORY  DATA:  None for this visit.  DIAGNOSTIC IMAGING:  None for this visit.   ASSESSMENT AND PLAN:   Felicia Perry is a pleasant 78 y.o. female with Stage 0 right breast DCIS, ER+/PR+, diagnosed in February 2023, treated with lumpectomy, adjuvant radiation therapy, and anti-estrogen therapy with anastrozole  beginning in October 2023.  Last mammogram in January 2024 with no evidence of recurrence, no evidence of malignancy in either breast. Last bone density scan which showed osteopenia with a T-score of -2.4, very close to being osteoporotic. She is now on adjuvant anastrozole .  Assessment and Plan Assessment & Plan  Breast cancer On anastrozole  therapy with no recurrence or new malignancy. - Continue anastrozole  therapy. - Schedule follow-up every six months, alternating between oncologist and breast surgeon.  Right breast seroma [place], tender, two-centimeter mass near surgical scar, assessed as seroma, non-cancerous. - Cancel ultrasound and mammogram orders. - Schedule diagnostic mammogram in January. - Monitor seroma for size or symptom changes.  Contact provider if it grows or becomes bothersome.   Return to clinic for follow up in January.   Total encounter time:30 minutes*in face-to-face visit time, chart review, lab review, care coordination, order entry, and documentation of the encounter time.  *Total Encounter Time as defined by the Centers for Medicare and Medicaid Services includes, in addition to the face-to-face time of a patient visit (documented in the note above) non-face-to-face time: obtaining and reviewing outside history, ordering and reviewing medications, tests or procedures, care coordination (communications with other health care professionals or caregivers) and documentation in the medical record.

## 2024-08-04 NOTE — Progress Notes (Signed)
 Felicia Perry                                          MRN: [id number]   08/04/2024   The [facility] Quality Team Specialist reviewed this patient medical record for the purposes of chart review for care gap closure. The following were reviewed: chart review for care gap closure-kidney health evaluation for diabetes:eGFR  and uACR.    [facility] Quality Team

## 2024-08-07 ENCOUNTER — Other Ambulatory Visit: Payer: Self-pay | Admitting: Family Medicine

## 2024-08-08 ENCOUNTER — Ambulatory Visit: Payer: Self-pay | Admitting: Family Medicine

## 2024-08-08 ENCOUNTER — Encounter: Payer: Self-pay | Admitting: Family Medicine

## 2024-08-08 ENCOUNTER — Ambulatory Visit (INDEPENDENT_AMBULATORY_CARE_PROVIDER_SITE_OTHER): Admitting: Family Medicine

## 2024-08-08 VITALS — BP 130/72 | HR 80 | Temp 97.4°F | Ht 60.0 in | Wt 155.6 lb

## 2024-08-08 DIAGNOSIS — Z23 Encounter for immunization: Secondary | ICD-10-CM | POA: Diagnosis not present

## 2024-08-08 DIAGNOSIS — E118 Type 2 diabetes mellitus with unspecified complications: Secondary | ICD-10-CM | POA: Diagnosis not present

## 2024-08-08 DIAGNOSIS — E559 Vitamin D deficiency, unspecified: Secondary | ICD-10-CM

## 2024-08-08 DIAGNOSIS — I10 Essential (primary) hypertension: Secondary | ICD-10-CM

## 2024-08-08 DIAGNOSIS — C50411 Malignant neoplasm of upper-outer quadrant of right female breast: Secondary | ICD-10-CM

## 2024-08-08 DIAGNOSIS — E1169 Type 2 diabetes mellitus with other specified complication: Secondary | ICD-10-CM | POA: Diagnosis not present

## 2024-08-08 DIAGNOSIS — Z7984 Long term (current) use of oral hypoglycemic drugs: Secondary | ICD-10-CM

## 2024-08-08 DIAGNOSIS — E785 Hyperlipidemia, unspecified: Secondary | ICD-10-CM

## 2024-08-08 LAB — CBC WITH DIFFERENTIAL/PLATELET
Basophils Absolute: 0.1 K/uL (ref 0.0–0.1)
Basophils Relative: 1 % (ref 0.0–3.0)
Eosinophils Absolute: 0.2 K/uL (ref 0.0–0.7)
Eosinophils Relative: 2.6 % (ref 0.0–5.0)
HCT: 41.2 % (ref 36.0–46.0)
Hemoglobin: 13.8 g/dL (ref 12.0–15.0)
Lymphocytes Relative: 14.1 % (ref 12.0–46.0)
Lymphs Abs: 1.2 K/uL (ref 0.7–4.0)
MCHC: 33.6 g/dL (ref 30.0–36.0)
MCV: 87.7 fl (ref 78.0–100.0)
Monocytes Absolute: 0.7 K/uL (ref 0.1–1.0)
Monocytes Relative: 8.4 % (ref 3.0–12.0)
Neutro Abs: 6.2 K/uL (ref 1.4–7.7)
Neutrophils Relative %: 73.9 % (ref 43.0–77.0)
Platelets: 327 K/uL (ref 150.0–400.0)
RBC: 4.7 Mil/uL (ref 3.87–5.11)
RDW: 12.7 % (ref 11.5–15.5)
WBC: 8.3 K/uL (ref 4.0–10.5)

## 2024-08-08 LAB — COMPREHENSIVE METABOLIC PANEL WITH GFR
ALT: 16 U/L (ref 0–35)
AST: 19 U/L (ref 0–37)
Albumin: 4.6 g/dL (ref 3.5–5.2)
Alkaline Phosphatase: 57 U/L (ref 39–117)
BUN: 26 mg/dL — ABNORMAL HIGH (ref 6–23)
CO2: 27 meq/L (ref 19–32)
Calcium: 9.9 mg/dL (ref 8.4–10.5)
Chloride: 102 meq/L (ref 96–112)
Creatinine, Ser: 1.01 mg/dL (ref 0.40–1.20)
GFR: 53.46 mL/min — ABNORMAL LOW (ref >60.00)
Glucose, Bld: 162 mg/dL — ABNORMAL HIGH (ref 70–99)
Potassium: 4.6 meq/L (ref 3.5–5.1)
Sodium: 140 meq/L (ref 135–145)
Total Bilirubin: 0.4 mg/dL (ref 0.2–1.2)
Total Protein: 7.5 g/dL (ref 6.0–8.3)

## 2024-08-08 LAB — MICROALBUMIN / CREATININE URINE RATIO
Creatinine,U: 134.2 mg/dL
Microalb Creat Ratio: 32 mg/g — ABNORMAL HIGH (ref 0.0–30.0)
Microalb, Ur: 4.3 mg/dL — ABNORMAL HIGH (ref 0.0–1.9)

## 2024-08-08 LAB — VITAMIN D 25 HYDROXY (VIT D DEFICIENCY, FRACTURES): VITD: 48.96 ng/mL (ref 30.00–100.00)

## 2024-08-08 LAB — HEMOGLOBIN A1C: Hgb A1c MFr Bld: 6.4 % (ref 4.6–6.5)

## 2024-08-08 MED ORDER — METFORMIN HCL ER 500 MG PO TB24: 500.0000 mg | ORAL_TABLET | Freq: Two times a day (BID) | ORAL | 11 refills | Status: AC

## 2024-08-08 NOTE — Patient Instructions (Addendum)
 Flu shot before you leave  hopefully a1c improved with weight loss and improved diet. With daily loose stools we opted to hold metformin  for 5 days and then restart just 500 mg but of the extended release which is less likely to cause loose stools. Let me know if stools don't improve  check vitamin D - just finished high dose- we want her to take at least 2000 units per day ongoing   Please stop by lab before you go If you have mychart- we will send your results within 3 business days of us  receiving them.  If you do not have mychart- we will call you about results within 5 business days of us  receiving them.  *please also note that you will see labs on mychart as soon as they post. I will later go in and write notes on them- will say notes from Dr. Katrinka   Recommended follow up: Return for next already scheduled visit or sooner if needed.

## 2024-08-08 NOTE — Progress Notes (Signed)
 Phone [phone] In person visit   Subjective:   Felicia Perry is a 78 y.o. year old very pleasant female patient who presents for/with See problem oriented charting Chief Complaint  Patient presents with   Diabetes    Pt would like to speak about diarrhea from metformin ; will be getting tdap at pharmacy; flu shot today;    Past Medical History-  Patient Active Problem List   Diagnosis Date Noted   Ductal carcinoma in situ (DCIS) of right breast 01/05/2022    Priority: High   Type 2 diabetes mellitus with complication, without long-term current use of insulin (HCC) 02/05/2021    Priority: High   Carotid stenosis 01/23/2020    Priority: High   Chronic heart failure with preserved ejection fraction (HCC) 01/12/2019    Priority: High   Coronary artery disease involving native coronary artery of native heart without angina pectoris     Priority: High   Syncope 08/04/2016    Priority: High   statin myalgia 06/25/2020    Priority: Medium    Mixed hyperlipidemia 07/11/2018    Priority: Medium    H/O hyperparathyroidism 08/18/2016    Priority: Medium    Vitamin D  deficiency 08/20/2015    Priority: Medium    Chronic pain syndrome 03/26/2015    Priority: Medium    Insomnia 07/13/2014    Priority: Medium    GERD (gastroesophageal reflux disease) 02/25/2011    Priority: Medium    Depression 04/22/2007    Priority: Medium    Essential hypertension 04/22/2007    Priority: Medium    Asthma 04/22/2007    Priority: Medium    Greater trochanteric bursitis of left hip 08/13/2020    Priority: Low   Senile purpura 08/07/2020    Priority: Low   Degenerative arthritis of knee, bilateral 12/08/2018    Priority: Low   Partial hamstring tear, initial encounter 11/16/2018    Priority: Low   Nonallopathic lesion of sacral region 07/05/2018    Priority: Low   Nonallopathic lesion of cervical region 07/05/2018    Priority: Low   DDD (degenerative disc disease), cervical 05/04/2018     Priority: Low   Degenerative arthritis of left knee 09/15/2017    Priority: Low   PUD (peptic ulcer disease)     Priority: Low   Hyperglycemia 08/06/2015    Priority: Low   Xerosis of skin 08/06/2015    Priority: Low   Hypersomnia with sleep apnea 03/26/2015    Priority: Low   Nonallopathic lesion of lumbosacral region 11/13/2014    Priority: Low   Osteoarthritis of left lower extremity 10/17/2014    Priority: Low   Chronic meniscal tear of knee 10/17/2014    Priority: Low   Nonallopathic lesion of thoracic region 09/14/2014    Priority: Low   Nonallopathic lesion-rib cage 08/21/2014    Priority: Low   Tendinopathy of right rotator cuff 07/30/2014    Priority: Low   Piriformis syndrome of right side 07/30/2014    Priority: Low   Limited joint range of motion 07/13/2014    Priority: Low   Rash 07/13/2014    Priority: Low   SI (sacroiliac) joint dysfunction 10/10/2007    Priority: Low   LOW BACK PAIN 07/01/2007    Priority: Low   Malignant neoplasm of upper-outer quadrant of right female breast, unspecified estrogen receptor status (HCC) 08/08/2024   Nonrheumatic aortic valve stenosis 06/15/2024   De Quervain's tenosynovitis, left 05/22/2024   Shortness of breath 09/25/2022  Medication management 01/19/2022   Intractable headache 01/02/2021   DOE (dyspnea on exertion) 09/02/2016    Medications- reviewed and updated Current Outpatient Medications  Medication Sig Dispense Refill   acetaminophen  (TYLENOL ) 325 MG tablet Take 650 mg by mouth every 6 (six) hours as needed for mild pain, moderate pain or headache.     albuterol  (VENTOLIN  HFA) 108 (90 Base) MCG/ACT inhaler Inhale 2 puffs into the lungs every 6 (six) hours as needed for wheezing or shortness of breath. 18 g 5   ALPRAZolam  (XANAX ) 1 MG tablet TAKE ONE TABLET BY MOUTH AT BEDTIME AS NEEDED FOR SLEEP -do not drive FOR EIGHT hours AFTER taking 90 tablet 1   amLODipine  (NORVASC ) 10 MG tablet Take 1 tablet (10 mg  total) by mouth daily. 90 tablet 1   anastrozole  (ARIMIDEX ) 1 MG tablet TAKE ONE TABLET DAILY 30 tablet 3   aspirin  EC 81 MG tablet Take 1 tablet (81 mg total) by mouth daily. Swallow whole. 90 tablet 3   Bempedoic Acid -Ezetimibe  (NEXLIZET ) 180-10 MG TABS Take 180 mg by mouth daily. 90 tablet 3   budesonide -formoterol  (SYMBICORT ) 160-4.5 MCG/ACT inhaler Inhale 2 puffs into the lungs 2 (two) times daily. 1 each 11   carvedilol  (COREG ) 12.5 MG tablet Take 1 tablet (12.5 mg total) by mouth 2 (two) times daily with a meal. 180 tablet 3   Cholecalciferol 50 MCG (2000 UT) CAPS Take 2,000 Units by mouth daily.     famotidine  (PEPCID ) 20 MG tablet TAKE ONE TABLET TWICE DAILY 60 tablet 5   irbesartan  (AVAPRO ) 300 MG tablet Take 1 tablet (300 mg total) by mouth daily. 90 tablet 1   Ketoconazole -Hydrocortisone  2-2.5 % CREA Apply twice daily. 30 g 0   levocetirizine (XYZAL ) 5 MG tablet TAKE ONE TABLET BY MOUTH EVERY MORNING 90 tablet 3   metFORMIN  (GLUCOPHAGE -XR) 500 MG 24 hr tablet Take 1 tablet (500 mg total) by mouth 2 (two) times daily with a meal. 60 tablet 11   omega-3 acid ethyl esters (LOVAZA ) 1 g capsule TAKE TWO CAPSULES EVERY DAY 180 capsule 3   spironolactone  (ALDACTONE ) 25 MG tablet Take 1 tablet (25 mg total) by mouth daily. 90 tablet 1   venlafaxine  XR (EFFEXOR -XR) 75 MG 24 hr capsule TAKE ONE CAPSULE DAILY WITH BREAKFAST 90 capsule 2   No current facility-administered medications for this visit.     Objective:  BP 130/72 (BP Location: Left Arm, Patient Position: Sitting, Cuff Size: Normal)   Pulse 80   Temp (!) 97.4 F (36.3 C) (Temporal)   Ht 5' (1.524 m)   Wt 155 lb 9.6 oz (70.6 kg)   SpO2 96%   BMI 30.39 kg/m  Gen: NAD, resting comfortably CV: RRR no murmurs rubs or gallops Lungs: CTAB no crackles, wheeze, rhonchi Ext: no edema Skin: warm, dry     Assessment and Plan    #Right breast DCIS on biopsy 12/31/2021-she has had a lumpectomy and has completed adjuvant radiation  on 04/02/2022.  She is taking anastrozole  as well and plans for 1-month follow-up  with Dr. Loretha. They are doing a diagnostic mammogram on one spot just to be cautious- thought related scar tissue but want to be safe. Appears to be stable  # Diabetes-diagnosed 08/07/2020 after having second A1c 6.5 or above S: Medication: Metformin  500 mg twice daily --> 1000mg  twice daily. Daily loose stools.  CBGs- doesn't check- declines meter Exercise and diet- down 10 lbs- working hard on diet and biking or walking Lab  Results  Component Value Date   HGBA1C 7.5 (H) 02/25/2024   HGBA1C 8.1 (H) 11/23/2023   HGBA1C 6.5 05/13/2023  A/P: hopefully a1c improved with weight loss and improved diet. With daily loose stools we opted to hold metformin  for 5 days and then restart just 500 mg but of the extended release which is less likely to cause loose stools.  -she's nervous about urinary tract infection risk with jardiance. ? Januvia if needed.    #hypertension #Chronic diastolic CHF S: medication: Amlodipine  10 mg, irbesartan  300 mg, coreg  12.5 mg twice daily,  spironolactone  25 mg -Has required Lasix  in the past for fluid overload, cautious with HCTZ due to hercalcemia history- none needed lately. Going up on spironolactone  was helpful -riding bike daily outsie! Prior shortness of breath improved BP Readings from Last 3 Encounters:  08/08/24 130/72  07/31/24 108/76  07/21/24 117/69  A/P: blood pressure well controlled continue current medications  Diastolic CHF improved with higher spironolactone - prior shortness of breath resolved   #CAD  #hyperlipidemia with statin myalgia-lpa not elevated #Carotid stenosis-checked September or so  each year by cardiology or vascular most recently S: Medication: nexlizet  (bempedoic acid -ezetimibe ), Lovaza , aspirin  81 mg -History of syncope related to ischemia -Still struggles with elevated triglycerides on this regimen -no chest pain or shortness of breath (prior  shortness of breath resolved)  Lab Results  Component Value Date   CHOL 126 11/23/2023   HDL 30.30 (L) 11/23/2023   LDLCALC 48 08/07/2020   LDLDIRECT 53.0 11/23/2023   TRIG (H) 11/23/2023    495.0 Triglyceride is over 400; calculations on Lipids are invalid.   CHOLHDL 4 11/23/2023    A/P: coronary artery disease asymptomatic continue current medications  Lipids with LDL at goal- triglyceride(s) were high but a1c is improving so suspect hat will improve on next check  # Asthma/allergies S: Maintenance Medication: Symbicort  160-4.52 puffs.  For allergies-on Xyzal  As needed medication: albuterol . Patient is using this almost never.  A/P: well controlled continue current medications     #Depression-medication also helps with chronic pain/insomnia S: Medication: Effexor  75 mg extended release alprazolam  1 mg as needed before bed -worsening pain off effexor     08/08/2024    8:08 AM 04/12/2024    3:45 PM 11/23/2023    9:00 AM  Depression screen PHQ 2/9  Decreased Interest 0 0 0  Down, Depressed, Hopeless 0 0 0  PHQ - 2 Score 0 0 0  Altered sleeping 0  0  Tired, decreased energy 0  1  Change in appetite 0  1  Feeling bad or failure about yourself  0  0  Trouble concentrating 0  0  Moving slowly or fidgety/restless 0  0  Suicidal thoughts 0  0  PHQ-9 Score 0  2  Difficult doing work/chores Not difficult at all  Not difficult at all  A/P: depression full remission continue current medications     #Vitamin D  deficiency S: Medication: finished high dose Last vitamin D  Lab Results  Component Value Date   VD25OH 25.26 (L) 11/23/2023  A/P: check vitamin D - just finished high dose- we want her to take at least 2000 units per day ongoing    Recommended follow up: Return for next already scheduled visit or sooner if needed. Future Appointments  Date Time Provider Department Center  08/22/2024  9:45 AM Claudene Arthea HERO, DO [facility] None  09/11/2024  8:00 AM Elmira Newman PARAS, MD  [facility]  11/21/2024  8:00 AM [facility] 1  [facility]  11/21/2024  9:00 AM Magda Debby SAILOR, MD [facility]  11/22/2024  8:30 AM Loretha Ash, MD [facility] None  11/24/2024 11:00 AM Katrinka Garnette KIDD, MD [facility]  04/17/2025  3:00 PM [facility] ANNUAL WELLNESS VISIT 1 [facility]  07/24/2025 11:45 AM Loretha Ash, MD [facility] None    Lab/Order associations:   ICD-10-CM   1. Essential hypertension  I10     2. Type 2 diabetes mellitus with complication, without long-term current use of insulin (HCC)  E11.8 Urine Albumin/Creatinine with ratio (send out) [LAB689]    Comprehensive metabolic panel with GFR    Hemoglobin A1c    CBC with Differential/Platelet    3. Hyperlipidemia associated with type 2 diabetes mellitus (HCC)  E11.69    E78.5     4. Malignant neoplasm of upper-outer quadrant of right female breast, unspecified estrogen receptor status (HCC)  C50.411     5. Immunization due  Z23 Flu vaccine HIGH DOSE PF(Fluzone Trivalent)    6. Vitamin D  deficiency  E55.9 VITAMIN D  25 Hydroxy (Vit-D Deficiency, Fractures)      Meds ordered this encounter  Medications   metFORMIN  (GLUCOPHAGE -XR) 500 MG 24 hr tablet    Sig: Take 1 tablet (500 mg total) by mouth 2 (two) times daily with a meal.    Dispense:  60 tablet    Refill:  11    Return precautions advised.  Garnette Katrinka, MD

## 2024-08-09 ENCOUNTER — Other Ambulatory Visit: Payer: Self-pay | Admitting: Hematology and Oncology

## 2024-08-09 DIAGNOSIS — Z1231 Encounter for screening mammogram for malignant neoplasm of breast: Secondary | ICD-10-CM

## 2024-08-10 NOTE — Progress Notes (Signed)
 Felicia Perry                                          MRN: [id number]   08/10/2024   The [facility] Quality Team Specialist reviewed this patient medical record for the purposes of chart review for care gap closure. The following were reviewed: abstraction for care gap closure-kidney health evaluation for diabetes:eGFR  and uACR.    [facility]I Quality Team

## 2024-08-17 NOTE — Progress Notes (Deleted)
 Felicia Perry Felicia [facility] [address] [place] [address] Phone: [phone] Subjective:    I'm seeing this patient by the request  of:  Felicia Garnette KIDD, MD  CC: Left thumb pain  YEP:Dlagzrupcz  05/22/2024 Patient given injection and tolerated the procedure well, discussed icing regimen of home exercises, discussed which activities to do in which 1 to avoid.  Thumb spica splint given.  Does have underlying arthritis that we will need to monitor.  Discussed icing regimen.  Follow-up again in 6 to 8 weeks.     Update 08/22/2024 Felicia Perry is a 78 y.o. female coming in with complaint of L thumb pain. Patient states       Past Medical History:  Diagnosis Date   Adenomatous polyp 11/23/2005   Anxiety    01/09/22- patient denies anxiety   Asthma 2003   Basal cell carcinoma 2021   right cheek per patient at [place] [facility]   Carotid stenosis    Chronic diastolic CHF (congestive heart failure) (HCC) 2017   Echo 2021 was normal   COPD (chronic obstructive pulmonary disease) (HCC)    Coronary artery disease 2017   a. HC on 08/14/16 showed RCA CTO s/p PCI, 60% LCX disease managed medically with recs to consider staged PCI if continued symptoms.    Depression 2008   pt reports MD diagnosed but she does not feel depressed   Diabetes mellitus without complication (HCC)    DIVERTICULOSIS, COLON 04/22/2007        GERD (gastroesophageal reflux disease) 2012   History of radiation therapy    Right breast- 03/04/22-04/02/22- Dr. Lynwood Perry   History of shingles    Hypertension 2008   Hypertensive heart disease    Insomnia 2003   Internal hemorrhoids    Osteoarthritis 2008   knees, hands, lower back (08/14/2016)   Pap smear for cervical cancer screening 2002   results normal   Personal history of radiation therapy    PUD (peptic ulcer disease) 1989   Seasonal allergies 2013   Sleep apnea    a. resolved with weight loss   Past Surgical History:   Procedure Laterality Date   BREAST BIOPSY Right 12/30/2021   BREAST EXCISIONAL BIOPSY Left    1980's x 3 - all benign   BREAST EXCISIONAL BIOPSY Right    1980's x 1 or 2 - all benign   BREAST LUMPECTOMY Right 01/22/2022   BREAST LUMPECTOMY WITH RADIOACTIVE SEED LOCALIZATION Right 01/22/2022   Procedure: RIGHT BREAST LUMPECTOMY WITH RADIOACTIVE SEED LOCALIZATION;  Surgeon: Felicia Ned, MD;  Location: [facility];  Service: General;  Laterality: Right;   BREAST SURGERY Left 1980s   Fibrous Tumors removed    CARDIAC CATHETERIZATION N/A 08/14/2016   Procedure: Left Heart Cath and Coronary Angiography;  Surgeon: Felicia Fell, MD;  Location: [facility] INVASIVE CV LAB;  Service: Cardiovascular;  Laterality: N/A;   CARDIAC CATHETERIZATION N/A 08/14/2016   Procedure: Coronary Stent Intervention;  Surgeon: Felicia Fell, MD;  Location[facility] INVASIVE CV LAB;  Service: Cardiovascular;  Laterality: N/A;   CATARACT EXTRACTION W/ INTRAOCULAR LENS  IMPLANT, BILATERAL Bilateral 05/2009   COLONOSCOPY N/A 2016   Cologuard 2019   CORONARY ANGIOPLASTY     detached retina left eye     may 2019   LEFT HEART CATH AND CORONARY ANGIOGRAPHY N/A 09/25/2022   Procedure: LEFT HEART CATH AND CORONARY ANGIOGRAPHY;  Surgeon: Felicia Lonni BIRCH, MD;  Location: [facility] INVASIVE CV LAB;  Service: Cardiovascular;  Laterality: N/A;   TUBAL LIGATION  1970s   Social History   Socioeconomic History   Marital status: Divorced    Spouse name: Not on file   Number of children: Not on file   Years of education: Not on file   Highest education level: Associate degree: occupational, scientist, product/process development, or vocational program  Occupational History   Occupation: Cleaning  Tobacco Use   Smoking status: Former    Current packs/day: 0.00    Average packs/day: 1 pack/day for 26.0 years (26.0 ttl pk-yrs)    Types: Cigarettes    Start date: 1963    Quit date: 1989    Years since quitting: 36.8   Smokeless tobacco: Never   Vaping Use   Vaping status: Never Used  Substance and Sexual Activity   Alcohol use: No   Drug use: No   Sexual activity: Not Currently  Other Topics Concern   Not on file  Social History Narrative   Divorced for 38 years in 2016. 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    Social Drivers of Health   Financial Resource Strain: Medium Risk (08/04/2024)   Overall Financial Resource Strain (CARDIA)    Difficulty of Paying Living Expenses: Somewhat hard  Food Insecurity: No Food Insecurity (08/04/2024)   Hunger Vital Sign    Worried About Running Out of Food in the Last Year: Never true    Ran Out of Food in the Last Year: Never true  Transportation Needs: No Transportation Needs (08/04/2024)   PRAPARE - Administrator, Civil Service (Medical): No    Lack of Transportation (Non-Medical): No  Physical Activity: Insufficiently Active (08/04/2024)   Exercise Vital Sign    Days of Exercise per Week: 3 days    Minutes of Exercise per Session: 20 min  Stress: No Stress Concern Present (08/04/2024)   Felicia Perry-davidson of Occupational Health - Occupational Stress Questionnaire    Feeling of Stress: Only a little  Social Connections: Moderately Integrated (08/04/2024)   Social Connection and Isolation Panel    Frequency of Communication with Friends and Family: More than three times a week    Frequency of Social Gatherings with Friends and Family: Twice a week    Attends Religious Services: More than 4 times per year    Active Member of [employer] or Organizations: Yes    Attends Engineer, Structural: More than 4 times per year    Marital Status: Divorced    Allergies  Allergen Reactions   Penicillins Anaphylaxis    REACTION: per patient causes rash,hives   Cephalosporins Swelling    REACTION: tongue swelling   Citalopram  Hydrobromide Other (See Comments)    psychosis   Rosuvastatin  Other (See Comments)    Pt reports causes bilateral lower extremity muscle aches.    Zolpidem Tartrate     REACTION: difficulty with concentration   Clarithromycin Rash    REACTION: blisters in mouth   Levofloxacin Rash    REACTION: tongue swells   Family History  Problem Relation Age of Onset   Heart disease Mother    Hypertension Mother  Diabetes Mother    Dementia Mother    COPD Father    Dementia Maternal Grandmother    Colon cancer Neg Hx     Current Outpatient Medications (Endocrine & Metabolic):    metFORMIN  (GLUCOPHAGE -XR) 500 MG 24 hr tablet, Take 1 tablet (500 mg total) by mouth 2 (two) times daily with a meal.  Current Outpatient Medications (Cardiovascular):    amLODipine  (NORVASC ) 10 MG tablet, Take 1 tablet (10 mg total) by mouth daily.   Bempedoic Acid -Ezetimibe  (NEXLIZET ) 180-10 MG TABS, Take 180 mg by mouth daily.   carvedilol  (COREG ) 12.5 MG tablet, Take 1 tablet (12.5 mg total) by mouth 2 (two) times daily with a meal.   irbesartan  (AVAPRO ) 300 MG tablet, Take 1 tablet (300 mg total) by mouth daily.   omega-3 acid ethyl esters (LOVAZA ) 1 g capsule, TAKE TWO CAPSULES EVERY DAY   spironolactone  (ALDACTONE ) 25 MG tablet, Take 1 tablet (25 mg total) by mouth daily.  Current Outpatient Medications (Respiratory):    albuterol  (VENTOLIN  HFA) 108 (90 Base) MCG/ACT inhaler, Inhale 2 puffs into the lungs every 6 (six) hours as needed for wheezing or shortness of breath.   budesonide -formoterol  (SYMBICORT ) 160-4.5 MCG/ACT inhaler, Inhale 2 puffs into the lungs 2 (two) times daily.   levocetirizine (XYZAL ) 5 MG tablet, TAKE ONE TABLET BY MOUTH EVERY MORNING  Current Outpatient Medications (Analgesics):    acetaminophen  (TYLENOL ) 325  MG tablet, Take 650 mg by mouth every 6 (six) hours as needed for mild pain, moderate pain or headache.   aspirin  EC 81 MG tablet, Take 1 tablet (81 mg total) by mouth daily. Swallow whole.   Current Outpatient Medications (Other):    ALPRAZolam  (XANAX ) 1 MG tablet, TAKE ONE TABLET BY MOUTH AT BEDTIME AS NEEDED FOR SLEEP -do not drive FOR EIGHT hours AFTER taking   anastrozole  (ARIMIDEX ) 1 MG tablet, TAKE ONE TABLET DAILY   Cholecalciferol 50 MCG (2000 UT) CAPS, Take 2,000 Units by mouth daily.   famotidine  (PEPCID ) 20 MG tablet, TAKE ONE TABLET TWICE DAILY   Ketoconazole -Hydrocortisone  2-2.5 % CREA, Apply twice daily.   venlafaxine  XR (EFFEXOR -XR) 75 MG 24 hr capsule, TAKE ONE CAPSULE DAILY WITH BREAKFAST   Reviewed prior external information including notes and imaging from  primary care provider As well as notes that were available from care everywhere and other healthcare systems.  Past medical history, social, surgical and family history all reviewed in electronic medical record.  No pertanent information unless stated regarding to the chief complaint.   Review of Systems:  No headache, visual changes, nausea, vomiting, diarrhea, constipation, dizziness, abdominal pain, skin rash, fevers, chills, night sweats, weight loss, swollen lymph nodes, body aches, joint swelling, chest pain, shortness of breath, mood changes. POSITIVE muscle aches  Objective  There were no vitals taken for this visit.   General: No apparent distress alert and oriented x3 mood and affect normal, dressed appropriately.  HEENT: Pupils equal, extraocular movements intact  Respiratory: Patient's speak in full sentences and does not appear short of breath  Cardiovascular: No lower extremity edema, non tender, no erythema  Left thumb exam shows      Impression and Recommendations:     The above documentation has been reviewed and is accurate and complete Zachary M Smith, DO

## 2024-08-22 ENCOUNTER — Ambulatory Visit: Admitting: Family Medicine

## 2024-08-27 ENCOUNTER — Encounter: Payer: Self-pay | Admitting: Pharmacist

## 2024-08-27 NOTE — Progress Notes (Signed)
 Pharmacy Quality Measure Review  This patient is appearing on a report for being at risk of failing the adherence measure for diabetes medications this calendar year.   Medication: metformin   Last fill date: 07/24/2024 for 90 day supply per insurance report.   Reviewed recent refill history in Dr Annemarie database. Actual last refill date was 08/08/2024 for 60 day supply. Patient has 11 refills remaining. Next appointment with PCP is 11/24/2024.    No additional intervention needed at this time.    Madelin Ray, PharmD Clinical Pharmacist [facility]  Population Health [phone]

## 2024-08-30 ENCOUNTER — Other Ambulatory Visit: Payer: Self-pay | Admitting: Family Medicine

## 2024-09-04 ENCOUNTER — Other Ambulatory Visit: Payer: Self-pay | Admitting: Family Medicine

## 2024-09-04 NOTE — Progress Notes (Unsigned)
 [facility] [address] [place] [address] Phone: [phone] Subjective:    I'm seeing this patient by the request  of:  Katrinka Garnette KIDD, MD  CC: Left thumb pain  YEP:Dlagzrupcz  05/22/2024 Patient given injection and tolerated the procedure well, discussed icing regimen of home exercises, discussed which activities to do in which 1 to avoid.  Thumb spica splint given.  Does have underlying arthritis that we will need to monitor.  Discussed icing regimen.  Follow-up again in 6 to 8 weeks.     Update 11/11//2025 Felicia Perry is a 78 y.o. female coming in with complaint of L thumb pain. Patient states       Past Medical History:  Diagnosis Date   Adenomatous polyp 11/23/2005   Anxiety    01/09/22- patient denies anxiety   Asthma 2003   Basal cell carcinoma 2021   right cheek per patient at [place] [facility]   Carotid stenosis    Chronic diastolic CHF (congestive heart failure) (HCC) 2017   Echo 2021 was normal   COPD (chronic obstructive pulmonary disease) (HCC)    Coronary artery disease 2017   a. HC on 08/14/16 showed RCA CTO s/p PCI, 60% LCX disease managed medically with recs to consider staged PCI if continued symptoms.    Depression 2008   pt reports MD diagnosed but she does not feel depressed   Diabetes mellitus without complication (HCC)    DIVERTICULOSIS, COLON 04/22/2007        GERD (gastroesophageal reflux disease) 2012   History of radiation therapy    Right breast- 03/04/22-04/02/22- Dr. Lynwood Nasuti   History of shingles    Hypertension 2008   Hypertensive heart disease    Insomnia 2003   Internal hemorrhoids    Osteoarthritis 2008   knees, hands, lower back (08/14/2016)   Pap smear for cervical cancer screening 2002   results normal   Personal history of radiation therapy    PUD (peptic ulcer disease) 1989   Seasonal allergies 2013   Sleep apnea    a. resolved with weight loss   Past Surgical  History:  Procedure Laterality Date   BREAST BIOPSY Right 12/30/2021   BREAST EXCISIONAL BIOPSY Left    1980's x 3 - all benign   BREAST EXCISIONAL BIOPSY Right    1980's x 1 or 2 - all benign   BREAST LUMPECTOMY Right 01/22/2022   BREAST LUMPECTOMY WITH RADIOACTIVE SEED LOCALIZATION Right 01/22/2022   Procedure: RIGHT BREAST LUMPECTOMY WITH RADIOACTIVE SEED LOCALIZATION;  Surgeon: Vanderbilt Ned, MD;  Location: [facility];  Service: General;  Laterality: Right;   BREAST SURGERY Left 1980s   Fibrous Tumors removed    CARDIAC CATHETERIZATION N/A 08/14/2016   Procedure: Left Heart Cath and Coronary Angiography;  Surgeon: Ozell Fell, MD;  Location: [facility] INVASIVE CV LAB;  Service: Cardiovascular;  Laterality: N/A;   CARDIAC CATHETERIZATION N/A 08/14/2016   Procedure: Coronary Stent Intervention;  Surgeon: Ozell Fell, MD;  Location: [facility] INVASIVE CV LAB;  Service: Cardiovascular;  Laterality: N/A;   CATARACT EXTRACTION W/ INTRAOCULAR LENS  IMPLANT, BILATERAL Bilateral 05/2009   COLONOSCOPY N/A 2016   Cologuard 2019   CORONARY ANGIOPLASTY     detached retina left eye     may 2019   LEFT HEART CATH AND CORONARY ANGIOGRAPHY N/A 09/25/2022   Procedure: LEFT HEART CATH AND CORONARY ANGIOGRAPHY;  Surgeon: Verlin Lonni BIRCH, MD;  Location: [facility] INVASIVE CV LAB;  Service: Cardiovascular;  Laterality: N/A;   TUBAL LIGATION  1970s   Social History   Socioeconomic History   Marital status: Divorced    Spouse name: Not on file   Number of children: Not on file   Years of education: Not on file   Highest education level: Associate degree: occupational, scientist, product/process development, or vocational program  Occupational History   Occupation: Cleaning  Tobacco Use   Smoking status: Former    Current packs/day: 0.00    Average packs/day: 1 pack/day for 26.0 years (26.0 ttl pk-yrs)    Types: Cigarettes    Start date: 1963    Quit date: 1989    Years since quitting: 36.8   Smokeless tobacco:  Never  Vaping Use   Vaping status: Never Used  Substance and Sexual Activity   Alcohol use: No   Drug use: No   Sexual activity: Not Currently  Other Topics Concern   Not on file  Social History Narrative   Divorced for 38 years in 2016. 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    Social Drivers of Health   Financial Resource Strain: Medium Risk (08/04/2024)   Overall Financial Resource Strain (CARDIA)    Difficulty of Paying Living Expenses: Somewhat hard  Food Insecurity: No Food Insecurity (08/04/2024)   Hunger Vital Sign    Worried About Running Out of Food in the Last Year: Never true    Ran Out of Food in the Last Year: Never true  Transportation Needs: No Transportation Needs (08/04/2024)   PRAPARE - Administrator, Civil Service (Medical): No    Lack of Transportation (Non-Medical): No  Physical Activity: Insufficiently Active (08/04/2024)   Exercise Vital Sign    Days of Exercise per Week: 3 days    Minutes of Exercise per Session: 20 min  Stress: No Stress Concern Present (08/04/2024)   [name] of Occupational Health - Occupational Stress Questionnaire    Feeling of Stress: Only a little  Social Connections: Moderately Integrated (08/04/2024)   Social Connection and Isolation Panel    Frequency of Communication with Friends and Family: More than three times a week    Frequency of Social Gatherings with Friends and Family: Twice a week    Attends Religious Services: More than 4 times per year    Active Member of [employer] or Organizations: Yes    Attends Engineer, Structural: More than 4 times per year    Marital Status:  Divorced   Allergies  Allergen Reactions   Penicillins Anaphylaxis    REACTION: per patient causes rash,hives   Cephalosporins Swelling    REACTION: tongue swelling   Citalopram  Hydrobromide Other (See Comments)    psychosis   Rosuvastatin  Other (See Comments)    Pt reports causes bilateral lower extremity muscle aches.    Zolpidem Tartrate     REACTION: difficulty with concentration   Clarithromycin Rash    REACTION: blisters in mouth   Levofloxacin Rash    REACTION: tongue swells   Family History  Problem Relation Age of Onset   Heart disease Mother    Hypertension Mother  Diabetes Mother    Dementia Mother    COPD Father    Dementia Maternal Grandmother    Colon cancer Neg Hx     Current Outpatient Medications (Endocrine & Metabolic):    metFORMIN  (GLUCOPHAGE -XR) 500 MG 24 hr tablet, Take 1 tablet (500 mg total) by mouth 2 (two) times daily with a meal.  Current Outpatient Medications (Cardiovascular):    amLODipine  (NORVASC ) 10 MG tablet, Take 1 tablet (10 mg total) by mouth daily.   Bempedoic Acid -Ezetimibe  (NEXLIZET ) 180-10 MG TABS, Take 180 mg by mouth daily.   carvedilol  (COREG ) 12.5 MG tablet, Take 1 tablet (12.5 mg total) by mouth 2 (two) times daily with a meal.   irbesartan  (AVAPRO ) 300 MG tablet, Take 1 tablet (300 mg total) by mouth daily.   omega-3 acid ethyl esters (LOVAZA ) 1 g capsule, TAKE TWO CAPSULES EVERY DAY   spironolactone  (ALDACTONE ) 25 MG tablet, Take 1 tablet (25 mg total) by mouth daily.  Current Outpatient Medications (Respiratory):    albuterol  (VENTOLIN  HFA) 108 (90 Base) MCG/ACT inhaler, Inhale 2 puffs into the lungs every 6 (six) hours as needed for wheezing or shortness of breath.   budesonide -formoterol  (SYMBICORT ) 160-4.5 MCG/ACT inhaler, Inhale 2 puffs into the lungs 2 (two) times daily.   levocetirizine (XYZAL ) 5 MG tablet, TAKE ONE TABLET BY MOUTH EVERY MORNING  Current Outpatient Medications (Analgesics):    acetaminophen   (TYLENOL ) 325 MG tablet, Take 650 mg by mouth every 6 (six) hours as needed for mild pain, moderate pain or headache.   aspirin  EC 81 MG tablet, Take 1 tablet (81 mg total) by mouth daily. Swallow whole.   Current Outpatient Medications (Other):    ALPRAZolam  (XANAX ) 1 MG tablet, TAKE ONE TABLET BY MOUTH AT BEDTIME AS NEEDED FOR SLEEP -do not drive FOR EIGHT hours AFTER taking   anastrozole  (ARIMIDEX ) 1 MG tablet, TAKE ONE TABLET DAILY   Cholecalciferol 50 MCG (2000 UT) CAPS, Take 2,000 Units by mouth daily.   famotidine  (PEPCID ) 20 MG tablet, TAKE ONE TABLET TWICE DAILY   Ketoconazole -Hydrocortisone  2-2.5 % CREA, Apply twice daily.   venlafaxine  XR (EFFEXOR -XR) 75 MG 24 hr capsule, TAKE ONE CAPSULE DAILY WITH BREAKFAST   Reviewed prior external information including notes and imaging from  primary care provider As well as notes that were available from care everywhere and other healthcare systems.  Past medical history, social, surgical and family history all reviewed in electronic medical record.  No pertanent information unless stated regarding to the chief complaint.   Review of Systems:  No headache, visual changes, nausea, vomiting, diarrhea, constipation, dizziness, abdominal pain, skin rash, fevers, chills, night sweats, weight loss, swollen lymph nodes, body aches, joint swelling, chest pain, shortness of breath, mood changes. POSITIVE muscle aches  Objective  There were no vitals taken for this visit.   General: No apparent distress alert and oriented x3 mood and affect normal, dressed appropriately.  HEENT: Pupils equal, extraocular movements intact  Respiratory: Patient's speak in full sentences and does not appear short of breath  Cardiovascular: No lower extremity edema, non tender, no erythema  Left thumb exam shows      Impression and Recommendations:     The above documentation has been reviewed and is accurate and complete Kiahna Banghart M Narada Uzzle, DO

## 2024-09-05 ENCOUNTER — Telehealth: Payer: Self-pay | Admitting: Family Medicine

## 2024-09-05 ENCOUNTER — Other Ambulatory Visit: Payer: Self-pay

## 2024-09-05 ENCOUNTER — Ambulatory Visit (INDEPENDENT_AMBULATORY_CARE_PROVIDER_SITE_OTHER): Admitting: Family Medicine

## 2024-09-05 ENCOUNTER — Encounter: Payer: Self-pay | Admitting: Family Medicine

## 2024-09-05 VITALS — BP 150/80 | HR 80 | Ht 60.0 in | Wt 159.0 lb

## 2024-09-05 DIAGNOSIS — M79645 Pain in left finger(s): Secondary | ICD-10-CM

## 2024-09-05 DIAGNOSIS — G5622 Lesion of ulnar nerve, left upper limb: Secondary | ICD-10-CM | POA: Insufficient documentation

## 2024-09-05 NOTE — Patient Instructions (Signed)
 Do prescribed exercises at least 3x a week Wear a compression sleeve with repetitive activity Don't hold phone for long durations of time Continue meds See you again in 2-3 months

## 2024-09-05 NOTE — Assessment & Plan Note (Addendum)
 At elbow, we discussed ulnar nerve flossing technique, discussed compression, discussed icing regimen and home exercises, increase activity slowly.  Increase activity slowly.  Follow-up again in 6 to 12 weeks.  This was a new problem for patient.  Will monitor.  Worsening can consider injection as well as occupational therapy.

## 2024-09-05 NOTE — Telephone Encounter (Signed)
 Patient in office stated that she needs a refill of Effexor  today if possible. She has been out for four days. Please advise.

## 2024-09-11 ENCOUNTER — Encounter: Payer: Self-pay | Admitting: Cardiology

## 2024-09-11 ENCOUNTER — Ambulatory Visit: Attending: Cardiology | Admitting: Cardiology

## 2024-09-11 VITALS — BP 142/68 | HR 79 | Ht 60.0 in | Wt 161.0 lb

## 2024-09-11 DIAGNOSIS — Z789 Other specified health status: Secondary | ICD-10-CM | POA: Diagnosis not present

## 2024-09-11 DIAGNOSIS — I350 Nonrheumatic aortic (valve) stenosis: Secondary | ICD-10-CM

## 2024-09-11 DIAGNOSIS — I25118 Atherosclerotic heart disease of native coronary artery with other forms of angina pectoris: Secondary | ICD-10-CM | POA: Diagnosis not present

## 2024-09-11 DIAGNOSIS — R0609 Other forms of dyspnea: Secondary | ICD-10-CM

## 2024-09-11 DIAGNOSIS — I10 Essential (primary) hypertension: Secondary | ICD-10-CM

## 2024-09-11 DIAGNOSIS — E782 Mixed hyperlipidemia: Secondary | ICD-10-CM | POA: Diagnosis not present

## 2024-09-11 NOTE — Patient Instructions (Addendum)
 Lab Work: FASTING LIPID PANEL BMP PROBNP  If you have labs (blood work) drawn today and your tests are completely normal, you will receive your results only by: MyChart Message (if you have MyChart) OR A paper copy in the mail If you have any lab test that is abnormal or we need to change your treatment, we will call you to review the results.  Testing/Procedures: PET/CT  How to Prepare for Your Cardiac PET/CT Stress Test:  1. Please do not take these medications before your test:  ~Medications that may interfere with the cardiac pharmacological stress agent (ex. nitrates - including erectile dysfunction medications, isosorbide mononitrate, tamulosin or beta-blockers **CARVEDILOL ) the day of the exam. (Erectile dysfunction medication should be held for at least 72 hrs prior to test) ~Theophylline containing medications for 12 hours. ~Dipyridamole 48 hours prior to the test. ~Your remaining medications may be taken with water.  2. Nothing to eat or drink, except water, 3 hours prior to arrival time.   ~ NO caffeine/decaffeinated products, or chocolate 12 hours prior to arrival.  3. NO perfume, cologne or lotion on chest or abdomen area.         - FEMALES - Please avoid wearing dresses to this appointment.  4. Total time is 1 to 2 hours; you may want to bring reading material for the waiting time.  Please report to Radiology at the [facility] Main Entrance 30 minutes early for your test. [address] [place], [place] [address]  Diabetic Preparation: - Hold oral medications. - You may take NPH and Lantus insulin. - Do not take Humalog or Humulin R (Regular Insulin) the day of your test. - Check blood sugars prior to leaving the house. - If able to eat breakfast prior to 3 hour fasting, you may take all medications, including your insulin, - Do not worry if you miss your breakfast dose of insulin - start at your next meal. - Patients who wear a continuous glucose  monitor MUST remove the device prior to scanning.  IF YOU THINK YOU MAY BE PREGNANT, OR ARE NURSING PLEASE INFORM THE TECHNOLOGIST.  In preparation for your appointment, medication and supplies will be purchased.  Appointment availability is limited, so if you need to cancel or reschedule, please call the Radiology Department at [phone] Geroge Law) OR [phone] [facility])  24 hours in advance to avoid a cancellation fee of $100.00  What to Expect After you Arrive:  Once you arrive and check in for your appointment, you will be taken to a preparation room within the Radiology Department.  A technologist or Nurse will obtain your medical history, verify that you are correctly prepped for the exam, and explain the procedure.  Afterwards,  an IV will be started in your arm and electrodes will be placed on your skin for EKG monitoring during the stress portion of the exam. Then you will be escorted to the PET/CT scanner.  There, staff will get you positioned on the scanner and obtain a blood pressure and EKG.  During the exam, you will continue to be connected to the EKG and blood pressure machines.  A small, safe amount of a radioactive tracer will be injected in your IV to obtain a series of pictures of your heart along with an injection of a stress agent.    After your Exam:  It is recommended that you eat a meal and drink a caffeinated beverage to counter act any effects of the stress agent.  Drink  plenty of fluids for the remainder of the day and urinate frequently for the first couple of hours after the exam.  Your doctor will inform you of your test results within 7-10 business days.  For more information and frequently asked questions, please visit our website : [email or web address]  For questions about your test or how to prepare for your test, please call: Cardiac Imaging Nurse Navigators Office: [phone]    Follow-Up: At [facility], you and your health  needs are our priority.  As part of our continuing mission to provide you with exceptional heart care, our providers are all part of one team.  This team includes your primary Cardiologist (physician) and Advanced Practice Providers or APPs (Physician Assistants and Nurse Practitioners) who all work together to provide you with the care you need, when you need it.  Your next appointment:   3 month(s)  Provider:   Newman JINNY Lawrence, MD

## 2024-09-11 NOTE — Progress Notes (Signed)
 Cardiology Office Note:  .   Date:  09/11/2024  ID:  Felicia Perry, Felicia Perry December 02, 1945, MRN [id number] PCP: Katrinka Garnette KIDD, MD   HeartCare Providers Cardiologist:  Newman Lawrence, MD PCP: Katrinka Garnette KIDD, MD  Chief Complaint  Patient presents with   Exertional dyspnea     Felicia Perry is a 78 y.o. female with hypertension, hyperlipidemia, type 2 diabetes mellitus, statin intolerance, CAD, HFpEF, carotid artery disease  Discussed the use of AI scribe software for clinical note transcription with the patient, who gave verbal consent to proceed.  History of Present Illness  Patient is here for follow-up today.  She has had no change in her stable mild exertional dyspnea symptoms.  She denies any chest pain.  Reviewed recent test results with the patient, details below.   Vitals:   09/11/24 0758  BP: (!) 142/68  Pulse: 79  SpO2: 97%      Review of Systems  Cardiovascular:  Positive for dyspnea on exertion. Negative for chest pain, leg swelling, palpitations and syncope.        Studies Reviewed: SABRA        EKG 12/01/2023: Sinus rhythm 71 bpm Normal QTc interval Normal EKG  Labs 11/2023: BNP 30  Labs 1-02/2024: Chol 126, TG 294, HDL 30, LDL 31 HbA1C 7.5% Hb 14.4 Cr 0.9  Echocardiogram 06/2024:  1. Left ventricular ejection fraction, by estimation, is 60 to 65%. The  left ventricle has normal function. The left ventricle has no regional  wall motion abnormalities. There is moderate left ventricular hypertrophy.  Left ventricular diastolic parameters were normal.   2. Right ventricular systolic function is normal. The right ventricular  size is normal. There is normal pulmonary artery systolic pressure. The  estimated right ventricular systolic pressure is 31.9 mmHg.   3. The mitral valve is normal in structure. Trivial mitral valve  regurgitation. No evidence of mitral stenosis.   4. The aortic valve is tricuspid. There is moderate  calcification of the  aortic valve. Aortic valve regurgitation is mild. Mild to moderate aortic  valve stenosis. Vmax 2.7 m/s, MG , AVA 1.6 cm^2, DI 0.5   5. The inferior vena cava is normal in size with greater than 50%  respiratory variability, suggesting right atrial pressure of 3 mmHg.   Coronary angiogram 2023: The LAD is a moderate to large caliber vessel that does not wrap around the apex. No angiographic evidence of disease in this vessel. Moderate caliber first diagonal branch with mild plaque.  The Circumflex is a large caliber vessel with two moderate caliber obtuse marginal branches. There is moderate disease in the AV groove Circumflex just beyond the takeoff of the first obtuse marginal branch and moderate disease at the takeoff of the second obtuse marginal branch. These lesions do not appear to be flow limiting.  The RCA is a large dominant artery with patent proximal, mid and distal stents with minimal restenosis.  Normal LVEDP Normal LV systolic function   Recommendations: Stable CAD with no flow limiting lesions. Moderate stenosis in the Circumflex should not be contributing to her dyspnea. Normal LV filling pressure with preserved LV systolic function. Explore other reasons for dyspnea.       Physical Exam Vitals and nursing note reviewed.  Constitutional:      General: She is not in acute distress. Neck:     Vascular: No JVD.  Cardiovascular:     Rate and Rhythm: Normal rate and regular rhythm.  Heart sounds: Murmur heard.     Harsh midsystolic murmur is present with a grade of 3/6 at the upper right sternal border radiating to the neck.  Pulmonary:     Effort: Pulmonary effort is normal.     Breath sounds: Normal breath sounds. No wheezing or rales.  Musculoskeletal:     Right lower leg: No tenderness.     Left lower leg: No tenderness.      VISIT DIAGNOSES:   ICD-10-CM   1. Exertional dyspnea  R06.09 Basic metabolic panel with GFR    Pro b  natriuretic peptide (BNP)    NM PET CT CARDIAC PERFUSION MULTI W/ABSOLUTE BLOODFLOW    Cardiac Stress Test: Informed Consent Details: Physician/Practitioner Attestation; Transcribe to consent form and obtain patient signature    2. Mixed hyperlipidemia  E78.2 Lipid panel    3. Coronary artery disease involving native coronary artery of native heart with other form of angina pectoris  I25.118     4. Statin intolerance  Z78.9     5. Nonrheumatic aortic valve stenosis  I35.0     6. Primary hypertension  I10         Felicia Perry is a 78 y.o. female with hypertension, hyperlipidemia, type 2 diabetes mellitus, statin intolerance, CAD, HFpEF, carotid artery disease Assessment & Plan  Dyspnea on exertion: NYHA class II. Aortic stenosis/aortic regurgitation remain mild-moderate.  proBNP was normal in 10/2023.  Otherwise, no significant findings on echocardiogram to explain dyspnea symptoms. With known CAD and prior PCI to CTO RCA, angina equivalent masquerading as dyspnea remains a possibility.  Will obtain PET/CT stress test.  CAD: PET/CT stress test, as discussed above. Continue aspirin  81 mg daily, carvedilol  12.5 mg twice daily. Known statin intolerance.  Continue Nexlizet .  Check fasting lipid panel today.  Hypertension: Well-controlled, numbers much lower at home. Continue carvedilol  12.5 mg twice daily, spironolactone  25 mg daily, amlodipine  10 mg daily, irbesartan  300 mg daily.   Carotid artery disease: Moderate asymptomatic carotid artery disease, followed by vascular surgery. Continue medical management including aspirin , Nexlizet , irbesartan .  Type 2 diabetes mellitus: Historically prediabetic, A1c has ranged in the sevens and eights this year.  Currently on metformin .  She is optimistic that diabetes control would have improved with dietary changes.  A1c is stable.        Informed Consent   Shared Decision Making/Informed Consent The risks [chest pain, shortness  of breath, cardiac arrhythmias, dizziness, blood pressure fluctuations, myocardial infarction, stroke/transient ischemic attack, nausea, vomiting, allergic reaction, radiation exposure, metallic taste sensation and life-threatening complications (estimated to be 1 in 10,000)], benefits (risk stratification, diagnosing coronary artery disease, treatment guidance) and alternatives of a cardiac PET stress test were discussed in detail with Felicia Perry and she agrees to proceed.      F/u in 3 months   Signed, Newman JINNY Lawrence, MD

## 2024-09-12 ENCOUNTER — Ambulatory Visit: Payer: Self-pay | Admitting: Cardiology

## 2024-09-12 LAB — BASIC METABOLIC PANEL WITH GFR
BUN/Creatinine Ratio: 24 (ref 12–28)
BUN: 23 mg/dL (ref 8–27)
CO2: 26 mmol/L (ref 20–29)
Calcium: 10 mg/dL (ref 8.7–10.3)
Chloride: 101 mmol/L (ref 96–106)
Creatinine, Ser: 0.94 mg/dL (ref 0.57–1.00)
Glucose: 156 mg/dL — ABNORMAL HIGH (ref 70–99)
Potassium: 4.6 mmol/L (ref 3.5–5.2)
Sodium: 141 mmol/L (ref 134–144)
eGFR: 62 mL/min/1.73 (ref >59)

## 2024-09-12 LAB — LIPID PANEL
Chol/HDL Ratio: 3.1 ratio (ref 0.0–4.4)
Cholesterol, Total: 107 mg/dL (ref 100–199)
HDL: 35 mg/dL — ABNORMAL LOW (ref >39)
LDL Chol Calc (NIH): 42 mg/dL (ref 0–99)
Triglycerides: 180 mg/dL — ABNORMAL HIGH (ref 0–149)
VLDL Cholesterol Cal: 30 mg/dL (ref 5–40)

## 2024-09-12 LAB — PRO B NATRIURETIC PEPTIDE: NT-Pro BNP: 337 pg/mL (ref 0–738)

## 2024-09-12 NOTE — Progress Notes (Signed)
 Blood sugar slightly elevated.  Cholesterol is much improved.  Continue current medications.  Will await results of stress test.  Thanks MJP

## 2024-09-18 ENCOUNTER — Emergency Department (HOSPITAL_COMMUNITY)

## 2024-09-18 ENCOUNTER — Ambulatory Visit: Payer: Self-pay

## 2024-09-18 ENCOUNTER — Other Ambulatory Visit: Payer: Self-pay

## 2024-09-18 ENCOUNTER — Emergency Department (HOSPITAL_COMMUNITY)
Admission: EM | Admit: 2024-09-18 | Discharge: 2024-09-18 | Disposition: A | Discharge status: Home / Self Care | Attending: Emergency Medicine | Admitting: Emergency Medicine

## 2024-09-18 ENCOUNTER — Encounter (HOSPITAL_COMMUNITY): Payer: Self-pay | Admitting: *Deleted

## 2024-09-18 DIAGNOSIS — I110 Hypertensive heart disease with heart failure: Secondary | ICD-10-CM | POA: Insufficient documentation

## 2024-09-18 DIAGNOSIS — R202 Paresthesia of skin: Secondary | ICD-10-CM | POA: Diagnosis not present

## 2024-09-18 DIAGNOSIS — R200 Anesthesia of skin: Secondary | ICD-10-CM | POA: Diagnosis not present

## 2024-09-18 DIAGNOSIS — Z87891 Personal history of nicotine dependence: Secondary | ICD-10-CM | POA: Insufficient documentation

## 2024-09-18 DIAGNOSIS — M47812 Spondylosis without myelopathy or radiculopathy, cervical region: Secondary | ICD-10-CM | POA: Diagnosis not present

## 2024-09-18 DIAGNOSIS — Z7984 Long term (current) use of oral hypoglycemic drugs: Secondary | ICD-10-CM | POA: Diagnosis not present

## 2024-09-18 DIAGNOSIS — J45909 Unspecified asthma, uncomplicated: Secondary | ICD-10-CM | POA: Diagnosis not present

## 2024-09-18 DIAGNOSIS — R270 Ataxia, unspecified: Secondary | ICD-10-CM | POA: Insufficient documentation

## 2024-09-18 DIAGNOSIS — J449 Chronic obstructive pulmonary disease, unspecified: Secondary | ICD-10-CM | POA: Diagnosis not present

## 2024-09-18 DIAGNOSIS — R0602 Shortness of breath: Secondary | ICD-10-CM | POA: Diagnosis not present

## 2024-09-18 DIAGNOSIS — I5030 Unspecified diastolic (congestive) heart failure: Secondary | ICD-10-CM | POA: Insufficient documentation

## 2024-09-18 DIAGNOSIS — I2510 Atherosclerotic heart disease of native coronary artery without angina pectoris: Secondary | ICD-10-CM | POA: Insufficient documentation

## 2024-09-18 DIAGNOSIS — M50221 Other cervical disc displacement at C4-C5 level: Secondary | ICD-10-CM | POA: Diagnosis not present

## 2024-09-18 DIAGNOSIS — E119 Type 2 diabetes mellitus without complications: Secondary | ICD-10-CM | POA: Diagnosis not present

## 2024-09-18 DIAGNOSIS — Z7982 Long term (current) use of aspirin: Secondary | ICD-10-CM | POA: Insufficient documentation

## 2024-09-18 DIAGNOSIS — M4802 Spinal stenosis, cervical region: Secondary | ICD-10-CM | POA: Diagnosis not present

## 2024-09-18 DIAGNOSIS — Z79899 Other long term (current) drug therapy: Secondary | ICD-10-CM | POA: Diagnosis not present

## 2024-09-18 DIAGNOSIS — I10 Essential (primary) hypertension: Secondary | ICD-10-CM | POA: Diagnosis not present

## 2024-09-18 LAB — DIFFERENTIAL
Abs Immature Granulocytes: 0.06 K/uL (ref 0.00–0.07)
Basophils Absolute: 0.1 K/uL (ref 0.0–0.1)
Basophils Relative: 2 %
Eosinophils Absolute: 0.3 K/uL (ref 0.0–0.5)
Eosinophils Relative: 4 %
Immature Granulocytes: 1 %
Lymphocytes Relative: 19 %
Lymphs Abs: 1.4 K/uL (ref 0.7–4.0)
Monocytes Absolute: 0.7 K/uL (ref 0.1–1.0)
Monocytes Relative: 9 %
Neutro Abs: 5.2 K/uL (ref 1.7–7.7)
Neutrophils Relative %: 65 %

## 2024-09-18 LAB — CBC
HCT: 41.3 % (ref 36.0–46.0)
Hemoglobin: 13.6 g/dL (ref 12.0–15.0)
MCH: 29.4 pg (ref 26.0–34.0)
MCHC: 32.9 g/dL (ref 30.0–36.0)
MCV: 89.4 fL (ref 80.0–100.0)
Platelets: 324 K/uL (ref 150–400)
RBC: 4.62 MIL/uL (ref 3.87–5.11)
RDW: 12.2 % (ref 11.5–15.5)
WBC: 7.7 K/uL (ref 4.0–10.5)
nRBC: 0 % (ref 0.0–0.2)

## 2024-09-18 LAB — COMPREHENSIVE METABOLIC PANEL WITH GFR
ALT: 17 U/L (ref 0–44)
AST: 24 U/L (ref 15–41)
Albumin: 3.9 g/dL (ref 3.5–5.0)
Alkaline Phosphatase: 57 U/L (ref 38–126)
Anion gap: 13 (ref 5–15)
BUN: 22 mg/dL (ref 8–23)
CO2: 23 mmol/L (ref 22–32)
Calcium: 9.4 mg/dL (ref 8.9–10.3)
Chloride: 102 mmol/L (ref 98–111)
Creatinine, Ser: 1.03 mg/dL — ABNORMAL HIGH (ref 0.44–1.00)
GFR, Estimated: 56 mL/min — ABNORMAL LOW (ref >60)
Glucose, Bld: 163 mg/dL — ABNORMAL HIGH (ref 70–99)
Potassium: 4.1 mmol/L (ref 3.5–5.1)
Sodium: 138 mmol/L (ref 135–145)
Total Bilirubin: 0.8 mg/dL (ref 0.0–1.2)
Total Protein: 7.3 g/dL (ref 6.5–8.1)

## 2024-09-18 LAB — RAPID URINE DRUG SCREEN, HOSP PERFORMED
Amphetamines: NOT DETECTED
Barbiturates: NOT DETECTED
Benzodiazepines: POSITIVE — AB
Cocaine: NOT DETECTED
Opiates: NOT DETECTED
Tetrahydrocannabinol: POSITIVE — AB

## 2024-09-18 LAB — PROTIME-INR
INR: 1 (ref 0.8–1.2)
Prothrombin Time: 13.9 s (ref 11.4–15.2)

## 2024-09-18 LAB — CBG MONITORING, ED: Glucose-Capillary: 174 mg/dL — ABNORMAL HIGH (ref 70–99)

## 2024-09-18 LAB — ETHANOL: Alcohol, Ethyl (B): <15 mg/dL (ref <15)

## 2024-09-18 LAB — APTT: aPTT: 36 s (ref 24–36)

## 2024-09-18 MED ORDER — IOHEXOL 350 MG/ML SOLN
75.0000 mL | Freq: Once | INTRAVENOUS | Status: AC | PRN
  Administered 2024-09-18: 75 mL via INTRAVENOUS

## 2024-09-18 MED ORDER — CARVEDILOL 12.5 MG PO TABS
12.5000 mg | ORAL_TABLET | Freq: Once | ORAL | Status: AC
  Administered 2024-09-18: 12.5 mg via ORAL
  Filled 2024-09-18: qty 1

## 2024-09-18 NOTE — ED Notes (Signed)
Pt transferred to MRI

## 2024-09-18 NOTE — ED Notes (Signed)
 Pt ambulated independently to bathroom.

## 2024-09-18 NOTE — ED Notes (Signed)
 Pt transported to CT ?

## 2024-09-18 NOTE — Telephone Encounter (Signed)
 Please see triage note. Patient advised to call 911 or report to closest ED.

## 2024-09-18 NOTE — Discharge Instructions (Addendum)
 It was a pleasure caring for you today in the emergency department.  Please return to the emergency department for any worsening or worrisome symptoms.

## 2024-09-18 NOTE — ED Provider Notes (Addendum)
 I received the patient in signout presenting with some left-sided paresthesias with negative MRI of her brain.  Plan was for MRI of her cervical spine with discharge with outpatient follow-up if this did not show any acute change.  The patient's MRI did not show any acute change but did show some spinal stenosis which the patient is aware of.  The patient is stable for discharge.  She is able to ambulate here and does not have any loss of strength.  She does have a sports medicine physician associated with orthopedic practice that she follows up with and is encouraged to follow-up with them as well regarding the spinal stenosis.  She is discharged with return precautions.  The patient's EKG interpreted by me shows a sinus rhythm with a rate of 65 with normal axis, normal intervals, no significant ST-T changes.  Physical Exam  BP (!) 188/73   Pulse 78   Temp 97.6 F (36.4 C) (Oral)   Resp 16   Ht 5' (1.524 m)   Wt 72.6 kg   SpO2 100%   BMI 31.25 kg/m   Physical Exam  Procedures  Procedures  ED Course / MDM   Clinical Course as of 09/18/24 1753  Mon Sep 18, 2024  1411 CTA w/ 3. Since 01/02/2021, increased hemodynamically significant stenosis of the proximal right cervical internal carotid artery (approximately 80%). 4. Overall similar hemodynamically significant stenosis of the proximal left cervical internal carotid artery (approximately 7080%). [SG]  1452 MRI wnl [SG]  1552 Spoke w/ Dr Matthews, recommend MRI c- spine; ordered [SG]    Clinical Course User Index [SG] Elnor Jayson LABOR, DO   Medical Decision Making Amount and/or Complexity of Data Reviewed Radiology: ordered.          Ula Prentice SAUNDERS, MD 09/18/24 1754    Ula Prentice SAUNDERS, MD 09/18/24 [phone]

## 2024-09-18 NOTE — Telephone Encounter (Signed)
 Noted- if its her left arm also good idea with heart history for evaluation as soon as possible

## 2024-09-18 NOTE — ED Notes (Signed)
 Pt transported to xray

## 2024-09-18 NOTE — ED Triage Notes (Signed)
 Pt c/o numbness in left hand for past 4-5 days. Left leg was tingling when she woke up this morning, denies weakness.  Pt does have shortness of breath.

## 2024-09-18 NOTE — ED Provider Triage Note (Signed)
 Emergency Medicine Provider Triage Evaluation Note  Felicia Perry , a 78 y.o. female  was evaluated in triage.  Pt complains of numbness and weakness.  78 year old female presenting to the emergency department with concern for strokelike symptoms.  The patient states that on Wednesday she noticed numbness and tingling in her left arm in an ulnar nerve distribution infecting the 5th and 4th digit radiating up to her elbow.  She denied any weakness until yesterday around 3:30 PM when she experienced left arm heaviness.  Symptoms have persisted and she now feels as if her left leg is also slightly heavy and she is experiencing numbness in her left calf posteriorly.  She denies any facial droop, difficulty swallowing or speaking, visual deficit, double vision or blurred vision or vision loss.  She denies any back pain, fevers or chills.  Review of Systems  Positive: Numbness, weakness Negative: Vision loss, facial droop, difficulty swallowing, speech abnormality  Physical Exam  BP (!) 205/70 (BP Location: Left Arm)   Pulse 77   Temp 98 F (36.7 C)   Resp 17   Ht 5' (1.524 m)   Wt 72.6 kg   SpO2 95%   BMI 31.25 kg/m  Gen:   Awake, no distress  Resp:  Normal effort  MSK:   Moves extremities without difficulty without evidence of trauma Neuro:  Cranial nerves II through XII intact, decreased sensation to light touch in the ulnar nerve distribution of the left arm, 5 out of 5 strength in the bilateral upper extremities, 4-5 strength in the left lower extremity, 5 out of 5 strength in the right lower extremity, sensation intact to light touch bilaterally in the lower extremities and right upper extremity  Medical Decision Making  Medically screening exam initiated at 10:39 AM.  Appropriate orders placed.  Felicia Perry was informed that the remainder of the evaluation will be completed by another provider, this initial triage assessment does not replace that evaluation, and the importance of  remaining in the ED until their evaluation is complete.  Presenting with strokelike symptoms, VAN negative, outside the window for code stroke given last known well.  Stroke workup initiated from triage.   Jerrol Agent, MD 09/18/24 [phone]

## 2024-09-18 NOTE — ED Notes (Signed)
 Pt c/o sudden onset headache

## 2024-09-18 NOTE — ED Provider Notes (Signed)
 [place] [facility] Provider Note  CSN: [id number] Arrival date & time: 09/18/24 1012  Chief Complaint(s) Numbness and Shortness of Breath  HPI Felicia Perry is a 78 y.o. female with past medical history as below, significant for anxiety, diastolic heart failure, COPD, CAD, DM, GERD who presents to the ED with complaint of weakness  Patient reports around 2 weeks ago she began noticing numbness, weakness to her left upper extremity, began in her fifth digit has progressed to her 4th and 3rd digit.  Seems to extend up her forearm.  Over the past few days she began experiencing left leg heaviness and weakness.  She reports that her vision is not right but unable to describe any further what she means.  Having some intermittent dizziness over the past couple weeks as well.  Reports generalized malaise over the past couple weeks, no fevers or chills, nausea or vomiting but has little energy or motivation to perform typical activities.  She is normally very active, plays pickle ball regularly.  Denies any recent falls or injuries, no recent spinal injection or instrumentation, no recent travel, no recent medication changes.  No recent diet changes.  Denies similar symptoms in the past.  Denies dyspnea  Past Medical History Past Medical History:  Diagnosis Date   Adenomatous polyp 11/23/2005   Anxiety    01/09/22- patient denies anxiety   Asthma 2003   Basal cell carcinoma 2021   right cheek per patient at [facility] [facility]   Carotid stenosis    Chronic diastolic CHF (congestive heart failure) (HCC) 2017   Echo 2021 was normal   COPD (chronic obstructive pulmonary disease) (HCC)    Coronary artery disease 2017   a. HC on 08/14/16 showed RCA CTO s/p PCI, 60% LCX disease managed medically with recs to consider staged PCI if continued symptoms.    Depression 2008   pt reports MD diagnosed but she does not feel depressed   Diabetes mellitus without  complication (HCC)    DIVERTICULOSIS, COLON 04/22/2007        GERD (gastroesophageal reflux disease) 2012   History of radiation therapy    Right breast- 03/04/22-04/02/22- Dr. Lynwood Nasuti   History of shingles    Hypertension 2008   Hypertensive heart disease    Insomnia 2003   Internal hemorrhoids    Osteoarthritis 2008   knees, hands, lower back (08/14/2016)   Pap smear for cervical cancer screening 2002   results normal   Personal history of radiation therapy    PUD (peptic ulcer disease) 1989   Seasonal allergies 2013   Sleep apnea    a. resolved with weight loss   Patient Active Problem List   Diagnosis Date Noted   Statin intolerance 09/11/2024   Cubital tunnel syndrome on left 09/05/2024   Malignant neoplasm of upper-outer quadrant of right female breast, unspecified estrogen receptor status (HCC) 08/08/2024   Nonrheumatic aortic valve stenosis 06/15/2024   De Quervain's tenosynovitis, left 05/22/2024   Shortness of breath 09/25/2022   Medication management 01/19/2022   Ductal carcinoma in situ (DCIS) of right breast 01/05/2022   Type 2 diabetes mellitus with complication, without long-term current use of insulin (HCC) 02/05/2021   Intractable headache 01/02/2021   Greater trochanteric bursitis of left hip 08/13/2020   Senile purpura 08/07/2020   statin myalgia 06/25/2020   Carotid stenosis 01/23/2020   Chronic heart failure with preserved ejection fraction (HCC) 01/12/2019   Degenerative arthritis of knee, bilateral 12/08/2018  Partial hamstring tear, initial encounter 11/16/2018   Mixed hyperlipidemia 07/11/2018   Nonallopathic lesion of sacral region 07/05/2018   Nonallopathic lesion of cervical region 07/05/2018   DDD (degenerative disc disease), cervical 05/04/2018   Degenerative arthritis of left knee 09/15/2017   Exertional dyspnea 09/02/2016   H/O hyperparathyroidism 08/18/2016   Coronary artery disease    PUD (peptic ulcer disease)    Syncope  08/04/2016   Vitamin D  deficiency 08/20/2015   Hyperglycemia 08/06/2015   Xerosis of skin 08/06/2015   Hypersomnia with sleep apnea 03/26/2015   Chronic pain syndrome 03/26/2015   Nonallopathic lesion of lumbosacral region 11/13/2014   Osteoarthritis of left lower extremity 10/17/2014   Chronic meniscal tear of knee 10/17/2014   Nonallopathic lesion of thoracic region 09/14/2014   Nonallopathic lesion-rib cage 08/21/2014   Tendinopathy of right rotator cuff 07/30/2014   Piriformis syndrome of right side 07/30/2014   Insomnia 07/13/2014   Limited joint range of motion 07/13/2014   Rash 07/13/2014   GERD (gastroesophageal reflux disease) 02/25/2011   SI (sacroiliac) joint dysfunction 10/10/2007   LOW BACK PAIN 07/01/2007   Depression 04/22/2007   Primary hypertension 04/22/2007   Asthma 04/22/2007   Home Medication(s) Prior to Admission medications   Medication Sig Start Date End Date Taking? Authorizing Provider  Vitamin D , Ergocalciferol , (DRISDOL ) 1.25 MG (50000 UNIT) CAPS capsule Take 50,000 Units by mouth once a week. 08/08/24  Yes [provider]  acetaminophen  (TYLENOL ) 325 MG tablet Take 650 mg by mouth every 6 (six) hours as needed for mild pain, moderate pain or headache.    [provider]  albuterol  (VENTOLIN  HFA) 108 (90 Base) MCG/ACT inhaler Inhale 2 puffs into the lungs every 6 (six) hours as needed for wheezing or shortness of breath. 01/23/20   Katrinka Garnette KIDD, MD  ALPRAZolam  (XANAX ) 1 MG tablet TAKE ONE TABLET BY MOUTH AT BEDTIME AS NEEDED FOR SLEEP -do not drive FOR EIGHT hours AFTER taking 05/08/24   Katrinka Garnette KIDD, MD  amLODipine  (NORVASC ) 10 MG tablet Take 1 tablet (10 mg total) by mouth daily. 05/11/24   Conte, Tessa N, PA-C  anastrozole  (ARIMIDEX ) 1 MG tablet TAKE ONE TABLET DAILY 06/19/24   Iruku, Praveena, MD  aspirin  EC 81 MG tablet Take 1 tablet (81 mg total) by mouth daily. Swallow whole. 07/01/21   Lelon Hamilton T, PA-C  Bempedoic  Acid-Ezetimibe  (NEXLIZET ) 180-10 MG TABS Take 180 mg by mouth daily. 07/04/24   Patwardhan, Newman PARAS, MD  budesonide -formoterol  (SYMBICORT ) 160-4.5 MCG/ACT inhaler Inhale 2 puffs into the lungs 2 (two) times daily. 11/23/23   Katrinka Garnette KIDD, MD  carvedilol  (COREG ) 12.5 MG tablet Take 1 tablet (12.5 mg total) by mouth 2 (two) times daily with a meal. 06/15/24   Patwardhan, Newman PARAS, MD  Cholecalciferol 50 MCG (2000 UT) CAPS Take 2,000 Units by mouth daily.    [provider]  famotidine  (PEPCID ) 20 MG tablet TAKE ONE TABLET TWICE DAILY 04/21/24   Katrinka Garnette KIDD, MD  irbesartan  (AVAPRO ) 300 MG tablet Take 1 tablet (300 mg total) by mouth daily. 04/14/24   Conte, Tessa N, PA-C  Ketoconazole -Hydrocortisone  2-2.5 % CREA Apply twice daily. 11/09/23   Kennyth Worth HERO, MD  levocetirizine (XYZAL ) 5 MG tablet TAKE ONE TABLET BY MOUTH EVERY MORNING 05/11/24   Katrinka Garnette KIDD, MD  metFORMIN  (GLUCOPHAGE -XR) 500 MG 24 hr tablet Take 1 tablet (500 mg total) by mouth 2 (two) times daily with a meal. 08/08/24   Katrinka Garnette KIDD,  MD  omega-3 acid ethyl esters (LOVAZA ) 1 g capsule TAKE TWO CAPSULES EVERY DAY 08/20/21   Hobart Powell BRAVO, MD  spironolactone  (ALDACTONE ) 25 MG tablet Take 1 tablet (25 mg total) by mouth daily. 06/15/24   Elmira Newman PARAS, MD  venlafaxine  XR (EFFEXOR -XR) 75 MG 24 hr capsule TAKE ONE CAPSULE DAILY WITH BREAKFAST 09/05/24   Claudene Arthea HERO, DO                                                                                                                                    Past Surgical History Past Surgical History:  Procedure Laterality Date   BREAST BIOPSY Right 12/30/2021   BREAST EXCISIONAL BIOPSY Left    1980's x 3 - all benign   BREAST EXCISIONAL BIOPSY Right    1980's x 1 or 2 - all benign   BREAST LUMPECTOMY Right 01/22/2022   BREAST LUMPECTOMY WITH RADIOACTIVE SEED LOCALIZATION Right 01/22/2022   Procedure: RIGHT BREAST LUMPECTOMY WITH RADIOACTIVE SEED  LOCALIZATION;  Surgeon: Vanderbilt Ned, MD;  Location: [facility];  Service: General;  Laterality: Right;   BREAST SURGERY Left 1980s   Fibrous Tumors removed    CARDIAC CATHETERIZATION N/A 08/14/2016   Procedure: Left Heart Cath and Coronary Angiography;  Surgeon: Ozell Fell, MD;  Location: [facility] INVASIVE CV LAB;  Service: Cardiovascular;  Laterality: N/A;   CARDIAC CATHETERIZATION N/A 08/14/2016   Procedure: Coronary Stent Intervention;  Surgeon: Ozell Fell, MD;  Location: [facility] INVASIVE CV LAB;  Service: Cardiovascular;  Laterality: N/A;   CATARACT EXTRACTION W/ INTRAOCULAR LENS  IMPLANT, BILATERAL Bilateral 05/2009   COLONOSCOPY N/A 2016   Cologuard 2019   CORONARY ANGIOPLASTY     detached retina left eye     may 2019   LEFT HEART CATH AND CORONARY ANGIOGRAPHY N/A 09/25/2022   Procedure: LEFT HEART CATH AND CORONARY ANGIOGRAPHY;  Surgeon: Verlin Lonni BIRCH, MD;  Location: [facility] INVASIVE CV LAB;  Service: Cardiovascular;  Laterality: N/A;   TUBAL LIGATION  1970s   Family History Family History  Problem Relation Age of Onset   Heart disease Mother    Hypertension Mother    Diabetes Mother    Dementia Mother    COPD Father    Dementia Maternal Grandmother    Colon cancer Neg Hx     Social History Social History   Tobacco Use   Smoking status: Former    Current packs/day: 0.00    Average packs/day: 1 pack/day for 26.0 years (26.0 ttl pk-yrs)    Types: Cigarettes    Start date: 1963    Quit date: 1989    Years since quitting: 36.9   Smokeless tobacco: Never  Vaping Use   Vaping status: Never Used  Substance Use Topics   Alcohol use: Yes   Drug use: No   Allergies Penicillins, Cephalosporins, Citalopram  hydrobromide, Rosuvastatin , Zolpidem tartrate, Clarithromycin, and Levofloxacin  Review of Systems A  thorough review of systems was obtained and all systems are negative except as noted in the HPI and PMH.   Physical Exam Vital Signs  I have  reviewed the triage vital signs BP (!) 184/78   Pulse 72   Temp 98.3 F (36.8 C) (Oral)   Resp 20   Ht 5' (1.524 m)   Wt 72.6 kg   SpO2 100%   BMI 31.25 kg/m  Physical Exam Vitals and nursing note reviewed.  Constitutional:      General: She is not in acute distress.    Appearance: Normal appearance. She is well-developed.  HENT:     Head: Normocephalic and atraumatic.     Right Ear: External ear normal.     Left Ear: External ear normal.     Nose: Nose normal.     Mouth/Throat:     Mouth: Mucous membranes are moist.  Eyes:     General: No visual field deficit or scleral icterus.       Right eye: No discharge.        Left eye: No discharge.     Extraocular Movements: Extraocular movements intact.     Pupils: Pupils are equal, round, and reactive to light.  Cardiovascular:     Rate and Rhythm: Normal rate and regular rhythm.     Pulses: Normal pulses.     Heart sounds: Normal heart sounds.  Pulmonary:     Effort: Pulmonary effort is normal. No respiratory distress.     Breath sounds: Normal breath sounds. No stridor.  Abdominal:     General: Abdomen is flat. There is no distension.     Palpations: Abdomen is soft.     Tenderness: There is no abdominal tenderness.  Musculoskeletal:     Cervical back: No rigidity.     Right lower leg: No edema.     Left lower leg: No edema.  Skin:    General: Skin is warm and dry.     Capillary Refill: Capillary refill takes less than 2 seconds.  Neurological:     Mental Status: She is alert and oriented to person, place, and time.     GCS: GCS eye subscore is 4. GCS verbal subscore is 5. GCS motor subscore is 6.     Cranial Nerves: Facial asymmetry present. No dysarthria.     Sensory: Sensory deficit present.     Motor: Weakness present. No tremor or pronator drift.     Coordination: Heel to Shin Test abnormal. Finger-Nose-Finger Test normal.     Comments: Ataxia noted to LLE 4/5 strength LUE LLE Asymmetric smile  Psychiatric:         Mood and Affect: Mood normal.        Behavior: Behavior normal. Behavior is cooperative.     ED Results and Treatments Labs (all labs ordered are listed, but only abnormal results are displayed) Labs Reviewed  COMPREHENSIVE METABOLIC PANEL WITH GFR - Abnormal; Notable for the following components:      Result Value   Glucose, Bld 163 (*)    Creatinine, Ser 1.03 (*)    GFR, Estimated 56 (*)    All other components within normal limits  RAPID URINE DRUG SCREEN, HOSP PERFORMED - Abnormal; Notable for the following components:   Benzodiazepines POSITIVE (*)    Tetrahydrocannabinol POSITIVE (*)    All other components within normal limits  CBG MONITORING, ED - Abnormal; Notable for the following components:   Glucose-Capillary 174 (*)    All other  components within normal limits  PROTIME-INR  APTT  CBC  DIFFERENTIAL  ETHANOL                                                                                                                          Radiology MR BRAIN WO CONTRAST Result Date: 09/18/2024 CLINICAL DATA:  Left arm numbness and tingling, left arm heaviness EXAM: MRI HEAD WITHOUT CONTRAST TECHNIQUE: Multiplanar, multiecho pulse sequences of the brain and surrounding structures were obtained without intravenous contrast. COMPARISON:  None Available. FINDINGS: MRI brain: No acute infarct. There are several foci of T2 hyperintensity in the cerebral white matter. These do not have restricted diffusion. The ventricles are normal. No mass lesion. There are normal flow signals in the carotid arteries and basilar artery. No significant bone marrow signal abnormality. No significant abnormality in the paranasal sinuses or soft tissues. IMPRESSION: No acute infarct or other significant abnormality Electronically Signed   By: Nancyann Burns M.D.   On: 09/18/2024 14:44   CT ANGIO HEAD NECK W WO CM Result Date: 09/18/2024 EXAM: CT HEAD WITHOUT CTA HEAD AND NECK WITH AND WITHOUT 09/18/2024  12:39:31 PM TECHNIQUE: CTA of the head and neck was performed with and without the administration of 75 mL of iohexol  (OMNIPAQUE ) 350 MG/ML injection. Noncontrast CT of the head with reconstructed 2-D images are also provided for review. Multiplanar 2D and/or 3D reformatted images are provided for review. Automated exposure control, iterative reconstruction, and/or weight based adjustment of the mA/kV was utilized to reduce the radiation dose to as low as reasonably achievable. COMPARISON: CTA head and neck 01/02/2021, MRI brain 01/02/2021, and CT head 01/02/2021. CLINICAL HISTORY: Neuro deficit, acute, stroke suspected. FINDINGS: CT HEAD: BRAIN AND VENTRICLES: No acute intracranial hemorrhage. No evidence of an acute demarcated territorial infarction. No mass effect. No midline shift. No extra-axial fluid collection. No hydrocephalus. There is overall similar mild scattered white matter hypodensities which are nonspecific but most commonly represent chronic microvascular ischemic changes. ORBITS: Orbits demonstrate bilateral lens replacement. SINUSES AND MASTOIDS: No acute abnormality. CTA NECK: AORTIC ARCH AND ARCH VESSELS: No dissection or arterial injury. No significant stenosis of the brachiocephalic or subclavian arteries. CERVICAL CAROTID ARTERIES: There is retropharyngeal course of the bilateral cervical internal carotid arteries. There is approximately 80% stenosis at the proximal right cervical internal carotid artery in the setting of calcified atherosclerotic plaque, increased since prior. There is approximately 70 to 80% stenosis of the proximal left cervical internal carotid artery in the setting of calcified atherosclerotic plaque, overall similar to prior exam. CERVICAL VERTEBRAL ARTERIES: No dissection, arterial injury, or significant stenosis. LUNGS AND MEDIASTINUM: Unremarkable. SOFT TISSUES: No acute abnormality. BONES: No acute abnormality. CTA HEAD: ANTERIOR CIRCULATION: No significant stenosis  of the internal carotid arteries. No significant stenosis of the anterior cerebral arteries. No significant stenosis of the middle cerebral arteries. No aneurysm. POSTERIOR CIRCULATION: No significant stenosis of the posterior cerebral arteries. No significant stenosis of the basilar artery. No significant stenosis  of the vertebral arteries. No aneurysm. OTHER: No dural venous sinus thrombosis on this non-dedicated study. IMPRESSION: 1. No acute intracranial hemorrhage or evidence of an acute demarcated territorial infarction. 2. No large vessel occlusion or significant stenosis of the major intracranial arteries. 3. Since 01/02/2021, increased hemodynamically significant stenosis of the proximal right cervical internal carotid artery (approximately 80%). 4. Overall similar hemodynamically significant stenosis of the proximal left cervical internal carotid artery (approximately 7080%). Electronically signed by: prentice spade 09/18/2024 02:04 PM EST RP Workstation: [id number]   DG Chest 2 View Result Date: 09/18/2024 CLINICAL DATA:  Shortness of breath. EXAM: CHEST - 2 VIEW COMPARISON:  Chest radiograph dated 12/01/2023. FINDINGS: No focal consolidation, pleural effusion, pneumothorax. The cardiac silhouette is within normal limits. No acute osseous pathology. IMPRESSION: No active cardiopulmonary disease. Electronically Signed   By: Vanetta Chou M.D.   On: 09/18/2024 12:13    Pertinent labs & imaging results that were available during my care of the patient were reviewed by me and considered in my medical decision making (see MDM for details).  Medications Ordered in ED Medications  iohexol  (OMNIPAQUE ) 350 MG/ML injection 75 mL (75 mLs Intravenous Contrast Given 09/18/24 1233)                                                                                                                                     Procedures Procedures  (including critical care time)  Medical Decision Making / ED  Course    Medical Decision Making:    Felicia Perry is a 78 y.o. female with past medical history as below, significant for anxiety, diastolic heart failure, COPD, CAD, DM, GERD who presents to the ED with complaint of weakness. The complaint involves an extensive differential diagnosis and also carries with it a high risk of complications and morbidity.  Serious etiology was considered. Ddx includes but is not limited to: CVA, TIA, electrolyte derangement, radiculopathy, electrolyte derangement, neoplasm, etc.  Complete initial physical exam performed, notably the patient was in no acute distress, airway intact.    Reviewed and confirmed nursing documentation for past medical history, family history, social history.  Vital signs reviewed.    Left-sided weakness Ataxia> - Progressive symptoms over the past 2 weeks, began left hand, fifth digit now progressing upper left forearm, left leg weakness and sensation change over the past 24 hours.  Difficulty walking, generalized weakness, ataxia left lower extremity, intermittent vision changes. - labs stable - CTA with stenosis as noted in ED course - MRI brain wo neg - UDS w/ benzo (rx'd xanax ) / thc - neuro consulted Dr Matthews > MRI c- spine recommended  - handoff Dr Ula pending MRI C-spine  Clinical Course as of 09/18/24 1557  Mon Sep 18, 2024  1411 CTA w/ 3. Since 01/02/2021, increased hemodynamically significant stenosis of the proximal right cervical internal carotid artery (approximately 80%). 4. Overall similar hemodynamically significant stenosis of the  proximal left cervical internal carotid artery (approximately 7080%). [SG]  1452 MRI wnl [SG]  1552 Spoke w/ Dr Matthews, recommend MRI c- spine; ordered [SG]    Clinical Course User Index [SG] Elnor Jayson LABOR, DO                    Additional history obtained: -Additional history obtained from na -External records from outside source obtained and reviewed  including: Chart review including previous notes, labs, imaging, consultation notes including  Primary care documentation She was seen at sports medicine earlier this month and provided thumb spica splint, rehab plan for home, consider formal pt   Lab Tests: -I ordered, reviewed, and interpreted labs.   The pertinent results include:   Labs Reviewed  COMPREHENSIVE METABOLIC PANEL WITH GFR - Abnormal; Notable for the following components:      Result Value   Glucose, Bld 163 (*)    Creatinine, Ser 1.03 (*)    GFR, Estimated 56 (*)    All other components within normal limits  RAPID URINE DRUG SCREEN, HOSP PERFORMED - Abnormal; Notable for the following components:   Benzodiazepines POSITIVE (*)    Tetrahydrocannabinol POSITIVE (*)    All other components within normal limits  CBG MONITORING, ED - Abnormal; Notable for the following components:   Glucose-Capillary 174 (*)    All other components within normal limits  PROTIME-INR  APTT  CBC  DIFFERENTIAL  ETHANOL    Notable for as above  EKG   EKG Interpretation Date/Time:    Ventricular Rate:    PR Interval:    QRS Duration:    QT Interval:    QTC Calculation:   R Axis:      Text Interpretation:           Imaging Studies ordered: I ordered imaging studies including MRI brain/ cta head neck I independently visualized the following imaging with scope of interpretation limited to determining acute life threatening conditions related to emergency care; findings noted above I agree with the radiologist interpretation If any imaging was obtained with contrast I closely monitored patient for any possible adverse reaction a/w contrast administration in the emergency department   Medicines ordered and prescription drug management: Meds ordered this encounter  Medications   iohexol  (OMNIPAQUE ) 350 MG/ML injection 75 mL    -I have reviewed the patients home medicines and have made adjustments as needed   Consultations  Obtained: I requested consultation with the neurology,  and discussed lab and imaging findings as well as pertinent plan - they recommend: mri c - spine   Cardiac Monitoring: Continuous pulse oximetry interpreted by myself, 100% on RA.    Social Determinants of Health:  Diagnosis or treatment significantly limited by social determinants of health: former smoker, thc use   Reevaluation: After the interventions noted above, I reevaluated the patient and found that they have stayed the same  Co morbidities that complicate the patient evaluation  Past Medical History:  Diagnosis Date   Adenomatous polyp 11/23/2005   Anxiety    01/09/22- patient denies anxiety   Asthma 2003   Basal cell carcinoma 2021   right cheek per patient at [place] [facility]   Carotid stenosis    Chronic diastolic CHF (congestive heart failure) (HCC) 2017   Echo 2021 was normal   COPD (chronic obstructive pulmonary disease) (HCC)    Coronary artery disease 2017   a. HC on 08/14/16 showed RCA CTO s/p PCI, 60% LCX disease managed medically with  recs to consider staged PCI if continued symptoms.    Depression 2008   pt reports MD diagnosed but she does not feel depressed   Diabetes mellitus without complication (HCC)    DIVERTICULOSIS, COLON 04/22/2007        GERD (gastroesophageal reflux disease) 2012   History of radiation therapy    Right breast- 03/04/22-04/02/22- Dr. Lynwood Nasuti   History of shingles    Hypertension 2008   Hypertensive heart disease    Insomnia 2003   Internal hemorrhoids    Osteoarthritis 2008   knees, hands, lower back (08/14/2016)   Pap smear for cervical cancer screening 2002   results normal   Personal history of radiation therapy    PUD (peptic ulcer disease) 1989   Seasonal allergies 2013   Sleep apnea    a. resolved with weight loss      Dispostion: Disposition decision including need for hospitalization was considered, and patient disposition pending at time of  sign out.    Final Clinical Impression(s) / ED Diagnoses Final diagnoses:  Paresthesia  Ataxia        Elnor Jayson LABOR, DO 09/18/24 1557

## 2024-09-18 NOTE — Telephone Encounter (Signed)
 FYI Only or Action Required?: FYI only for provider: ED advised.  Patient was last seen in primary care on 08/08/2024 by Felicia Garnette KIDD, MD.  Called Nurse Triage reporting Neurologic Problem.  Symptoms began several days ago.  Interventions attempted: Nothing.  Symptoms are: gradually worsening.  Triage Disposition: Call EMS 911 Now  Patient/caregiver understands and will follow disposition?: Yes, but will wait                               1. SYMPTOM: What is the main symptom you are concerned about? (e.g., weakness, numbness)     Numbness from left hand to elbow, describes numbness as loss of sensation in fingers and pins and needles sensation going up arm  2. ONSET: When did this start? (e.g., minutes, hours, days; while sleeping)     Sudden onset, states she woke-up with symptoms on Wednesday or Thursday of last week 4. PATTERN Does this come and go, or has it been constant since it started?  Is it present now?     Constant and present now 6. NEUROLOGIC SYMPTOMS: Have you had any of the following symptoms: headache, dizziness, vision loss, double vision, changes in speech, unsteady on your feet?     Denies headache, denies dizziness, denies changes in vision, denies changes in speech, denies facial drooping     This RN advised ED via EMS. Patient declined EMS, but stated she would go to the ED.   Copied from CRM #[id number]. Topic: Clinical - Red Word Triage >> Sep 18, 2024  9:05 AM [name] wrote: Red Word that prompted transfer to Nurse Triage: Numbness In Left Hand + Elbow  Reason for Disposition  [1] Weakness (i.e., paralysis, loss of muscle strength) of the face, arm / hand, or leg / foot on one side of the body AND [2] sudden onset AND [3] present now  (Exception: Bell's palsy suspected: weakness on one side of the face developing over hours to days, with no other symptoms.)  Protocols used: Neurologic Deficit-A-AH

## 2024-09-19 ENCOUNTER — Telehealth: Payer: Self-pay | Admitting: Family Medicine

## 2024-09-19 ENCOUNTER — Ambulatory Visit: Admitting: Family Medicine

## 2024-09-19 ENCOUNTER — Ambulatory Visit: Payer: Self-pay | Admitting: *Deleted

## 2024-09-19 VITALS — BP 120/60 | HR 68 | Temp 97.3°F | Ht 60.0 in | Wt 158.0 lb

## 2024-09-19 DIAGNOSIS — G5622 Lesion of ulnar nerve, left upper limb: Secondary | ICD-10-CM

## 2024-09-19 DIAGNOSIS — E1169 Type 2 diabetes mellitus with other specified complication: Secondary | ICD-10-CM

## 2024-09-19 DIAGNOSIS — I10 Essential (primary) hypertension: Secondary | ICD-10-CM

## 2024-09-19 DIAGNOSIS — E118 Type 2 diabetes mellitus with unspecified complications: Secondary | ICD-10-CM

## 2024-09-19 DIAGNOSIS — E785 Hyperlipidemia, unspecified: Secondary | ICD-10-CM

## 2024-09-19 NOTE — Progress Notes (Signed)
 Phone [phone] In person visit   Subjective:   Felicia Perry is a 78 y.o. year old very pleasant female patient who presents for/with See problem oriented charting Chief Complaint  Patient presents with   Essential hypertension   Numbness    Pt c/o numbness in left leg and left calf, present for over 1 week. Seen in ED on 09/18/2024.    Past Medical History-  Patient Active Problem List   Diagnosis Date Noted   Ductal carcinoma in situ (DCIS) of right breast 01/05/2022    Priority: High   Type 2 diabetes mellitus with complication, without long-term current use of insulin (HCC) 02/05/2021    Priority: High   Carotid stenosis 01/23/2020    Priority: High   Chronic heart failure with preserved ejection fraction (HCC) 01/12/2019    Priority: High   Coronary artery disease     Priority: High   Syncope 08/04/2016    Priority: High   statin myalgia 06/25/2020    Priority: Medium    Mixed hyperlipidemia 07/11/2018    Priority: Medium    H/O hyperparathyroidism 08/18/2016    Priority: Medium    Vitamin D  deficiency 08/20/2015    Priority: Medium    Chronic pain syndrome 03/26/2015    Priority: Medium    Insomnia 07/13/2014    Priority: Medium    GERD (gastroesophageal reflux disease) 02/25/2011    Priority: Medium    Depression 04/22/2007    Priority: Medium    Primary hypertension 04/22/2007    Priority: Medium    Asthma 04/22/2007    Priority: Medium    Greater trochanteric bursitis of left hip 08/13/2020    Priority: Low   Senile purpura 08/07/2020    Priority: Low   Degenerative arthritis of knee, bilateral 12/08/2018    Priority: Low   Partial hamstring tear, initial encounter 11/16/2018    Priority: Low   Nonallopathic lesion of sacral region 07/05/2018    Priority: Low   Nonallopathic lesion of cervical region 07/05/2018    Priority: Low   DDD (degenerative disc disease), cervical 05/04/2018    Priority: Low   Degenerative arthritis of left knee  09/15/2017    Priority: Low   PUD (peptic ulcer disease)     Priority: Low   Hyperglycemia 08/06/2015    Priority: Low   Xerosis of skin 08/06/2015    Priority: Low   Hypersomnia with sleep apnea 03/26/2015    Priority: Low   Nonallopathic lesion of lumbosacral region 11/13/2014    Priority: Low   Osteoarthritis of left lower extremity 10/17/2014    Priority: Low   Chronic meniscal tear of knee 10/17/2014    Priority: Low   Nonallopathic lesion of thoracic region 09/14/2014    Priority: Low   Nonallopathic lesion-rib cage 08/21/2014    Priority: Low   Tendinopathy of right rotator cuff 07/30/2014    Priority: Low   Piriformis syndrome of right side 07/30/2014    Priority: Low   Limited joint range of motion 07/13/2014    Priority: Low   Rash 07/13/2014    Priority: Low   SI (sacroiliac) joint dysfunction 10/10/2007    Priority: Low   LOW BACK PAIN 07/01/2007    Priority: Low   Statin intolerance 09/11/2024   Cubital tunnel syndrome on left 09/05/2024   Malignant 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     albuterol  (VENTOLIN  HFA) 108 (90 Base) MCG/ACT inhaler Inhale 2 puffs into the lungs every 6 (six) hours as needed for wheezing or shortness of breath. 18 g 5   ALPRAZolam  (XANAX ) 1 MG tablet TAKE ONE TABLET BY MOUTH AT BEDTIME AS NEEDED FOR SLEEP -do not drive FOR EIGHT hours AFTER taking 90 tablet 1    amLODipine  (NORVASC ) 10 MG tablet Take 1 tablet (10 mg total) by mouth daily. 90 tablet 1   anastrozole  (ARIMIDEX ) 1 MG tablet TAKE ONE TABLET DAILY 30 tablet 3   aspirin  EC 81 MG tablet Take 1 tablet (81 mg total) by mouth daily. Swallow whole. 90 tablet 3   Bempedoic Acid -Ezetimibe  (NEXLIZET ) 180-10 MG TABS Take 180 mg by mouth daily. 90 tablet 3   carvedilol  (COREG ) 12.5 MG tablet Take 1 tablet (12.5 mg total) by mouth 2 (two) times daily with a meal. 180 tablet 3   famotidine  (PEPCID ) 20 MG tablet TAKE ONE TABLET TWICE DAILY 60 tablet 5   irbesartan  (AVAPRO ) 300 MG tablet Take 1 tablet (300 mg total) by mouth daily. 90 tablet 1   Ketoconazole -Hydrocortisone  2-2.5 % CREA Apply twice daily. (Patient taking differently: Apply 1 Application topically in the morning and at bedtime.) 30 g 0   levocetirizine (XYZAL ) 5 MG tablet TAKE ONE TABLET BY MOUTH EVERY MORNING 90 tablet 3   metFORMIN  (GLUCOPHAGE -XR) 500 MG 24 hr tablet Take 1 tablet (500 mg total) by mouth 2 (two) times daily with a meal. 60 tablet 11   omega-3 acid ethyl esters (LOVAZA ) 1 g capsule TAKE TWO CAPSULES EVERY DAY 180 capsule 3   spironolactone  (ALDACTONE ) 25 MG tablet Take 1 tablet (25 mg total) by mouth daily. 90 tablet 1   venlafaxine  XR (EFFEXOR -XR) 75 MG 24 hr capsule TAKE ONE CAPSULE DAILY WITH BREAKFAST 90 capsule 2   Vitamin D , Ergocalciferol , (DRISDOL ) 1.25 MG (50000 UNIT) CAPS capsule Take 50,000 Units by mouth once a week.     Cholecalciferol 50 MCG (2000 UT) CAPS Take 2,000 Units by mouth daily. (Patient not taking: Reported on 09/18/2024)     No current facility-administered medications for this visit.     Objective:  BP 120/60   Pulse 68   Temp (!) 97.3 F (36.3 C) (Temporal)   Ht 5' (1.524 m)   Wt 158 lb (71.7 kg)   SpO2 98%   BMI 30.86 kg/m  Gen: NAD, resting comfortably CV: RRR no murmurs rubs or gallops Lungs: CTAB no crackles, wheeze, rhonchi Ext: no edema Skin: warm, dry Neuro: Equal sensation to  gross touch in bilateral arms and legs.  Good grip strength bilaterally    Assessment and Plan   # Emergency department follow-up for paresthesias S: Patient was seen 09/18/2024 emergency department due to left sided paresthesias.  Negative MRI of the brain.  She had MRI of her cervical spine showing spinal stenosis-this was not a new finding for patient.  She was able to ambulate and thankfully did not have any loss of strength.  Essentially recommended outpatient follow-up with [place] [facility] in regards to the spinal stenosis.  We also did other workup including EKG without significant ST or T wave changes and normal sinus rhythm and normal axis  She did have CT angio of the head and neck as well which did show some progressive stenosis of the proximal right internal carotid artery at near 80% with stable stenosis of proximal left internal carotid artery at 70 to 80% -CMP was largely stable with GFR of 56, ethanol level not elevated, CBC was completely normal, PT/INR not elevated, APTT normal range, urine drug screen did show THC and benzodiazepines-we discussed needing to stop alprazolam  future prescriptions given illegal substance if continued- she states was one time gummy use and agrees to stop usage.  Chest x-ray without acute cardiopulmonary disease  Her blood pressure was significantly elevated there and that home over 180 but she had not taken her home blood pressure medicines including amlodipine  10 mg, irbesartan  3 mg, carvedilol  12.5 mg twice daily, spironolactone  25 mg- she took around 8 30 today.   Today reports  ongoing constant numbness from elbow to hand including pinky but not into shoulder and left calf as well. Felt some mild issues in right arm as well but could have been from seating position.  - she reports the left arm issue had been going on since at least her visit with DrRONITA Claudene rusk 09/05/24 and a few days prior- they had considered injection for cubital  tunnel syndrome. Numbness in the leg started just in calf in hospital. Has been worsening  A/P: Paresthesias of left arm have actually been going on since early November and has already seen sports medicine for this-I reviewed the plan from Dr. Claudene and the discussion that if symptoms worsen to come back for cubital tunnel syndrome evaluation and potential injection-this gave patient a lot of peace of mind and reach out to Dr. Claudene and he was agreeable to try to work her in in the next 10 to 14 days getting her on a cancellation list    Edibles 1.5 weeks ago- agrees to stop-I do not think this contributed to her symptoms though but did advise her to stop  As far as the left calf pain-I initially wondered if this could be related to her right sided carotid stenosis which appeared worse but since it is so isolated to a brief segment of her calf as well as forearm down only I think it is less likely.  Also less likely suspect spinal stenosis.  The fact that the left calf pain primarily started in the emergency department itself makes me think it could be anxiety related as well-for instance in the office today when she was anxious she felt some right hand pain but upon repositioning of her arm that resolved  In addition to anxiety playing a role with symptoms I think it could also be increasing her blood pressure-after we had a good conversation today about her potential causes her blood pressure will completely normalized and had been extremely elevated earlier today.  We also reviewed all her emergency department appropriate workup  Obviously if she has new or worsening symptoms I would like for her to return to care sooner.  I am thankful for extensive workup in the emergency department  # Hypertension-well-controlled after anxiety improved with ongoing amlodipine  10 mg, carvedilol  12.5 mg twice daily, irbesartan  3 mg, spironolactone  25 mg-continue current medication with good control  #  Diabetes-well-controlled with most recent A1c under 7-continue metformin  5 mg extended release twice daily -I do not think paresthesias  are diabetic neuropathy related.  Would consider B12 recheck if new or worsening symptoms  # Hyperlipidemia associate with diabetes-patient does not tolerate statins but is appropriately on nexlizet  and Lovaza  - continue current medications - last LDL well controlled  Lab Results  Component Value Date   CHOL 107 09/11/2024   HDL 35 (L) 09/11/2024   LDLCALC 42 09/11/2024   LDLDIRECT 53.0 11/23/2023   TRIG 180 (H) 09/11/2024   CHOLHDL 3.1 09/11/2024     Recommended follow up: Return for next already scheduled visit or sooner if needed. Future Appointments  Date Time Provider Department Center  10/04/2024  7:20 AM [facility] PET CT 1 [facility]  10/31/2024  2:00 PM [facility] 9 [facility]  10/31/2024  3:00 PM Magda Debby SAILOR, MD [facility] H[facility]V  11/22/2024  8:30 AM Loretha Ash, MD [facility] None  11/24/2024 10:00 AM [facility] MM 2 [facility]  11/24/2024 11:00 AM Katrinka Garnette KIDD, MD [facility]  12/06/2024  9:00 AM Claudene Arthea HERO, DO [facility] None  12/12/2024  8:45 AM Patwardhan, Newman PARAS, MD [facility] H&V  04/17/2025  3:00 PM [facility] ANNUAL WELLNESS VISIT 1 [facility] [place]  07/24/2025 11:45 AM Loretha Ash, MD [facility] None    Lab/Order associations:   ICD-10-CM   1. Cubital tunnel syndrome on left  G56.22     2. Essential hypertension  I10     3. Type 2 diabetes mellitus with complication, without long-term current use of insulin (HCC)  E11.8     4. Hyperlipidemia associated with type 2 diabetes mellitus (HCC)  E11.69    E78.5       No orders of the defined types were placed in this encounter.  I personally spent a total of 45 minutes in the care of the patient today including preparing to see the patient, getting/reviewing separately obtained history, performing a medically appropriate exam/evaluation,  counseling and educating about THC as well as how anxiety can play a role in symptoms but also not to ignore symptoms, referring and communicating with other health care professionals- reaching out to DrRONITA Claudene, and documenting clinical information in the EHR.   Return precautions advised.  Garnette Katrinka, MD

## 2024-09-19 NOTE — Telephone Encounter (Signed)
 Patient call was warm transferred to me by triage nurse. Patient expressed she was in [facility] ED for nine hours, mainly in a room hooked up to various monitors between her testing. Patient expressed that she was not given any solutions for her sx and was d/c from ED with BP of 180 over an diastolic that I wasn't able to clarify. Patient states she is worried about health but declines to go back due to previous experience. Patient has been scheduled for 10/04/24 w/ PCP and is wanting to know if she should take her BP medication. Please advise.

## 2024-09-19 NOTE — Telephone Encounter (Signed)
 Yes she should take her blood pressure medicine because without it it should continue to run higher.  See my other message about working in at 1140 today

## 2024-09-19 NOTE — Telephone Encounter (Signed)
 Please see triage notes and advise. Was seen in ED yesterday and was sent home. Numbness and tingling in upper extremity and hypertension;

## 2024-09-19 NOTE — Telephone Encounter (Signed)
 Please see high priority message from our front desk. Patient talked to them after triage.

## 2024-09-19 NOTE — Telephone Encounter (Signed)
 FYI Only or Action Required?: FYI only for provider: ED advised and patient declined and does not want to go back to ED.  Patient was last seen in primary care on 08/08/2024 by Katrinka Garnette KIDD, MD.  Called Nurse Triage reporting Hypertension.  Symptoms began yesterday.  Interventions attempted: Nothing.  Symptoms are: rapidly worsening.  Triage Disposition: Go to ED Now (Notify PCP)  Patient/caregiver understands and will follow disposition?: No, wishes to speak with PCP     CAL notified patient refusal for ED again.       Copied from CRM #[id number]. Topic: Clinical - Red Word Triage >> Sep 18, 2024  9:05 AM [name] wrote: Red Word that prompted transfer to Nurse Triage: Numbness In Left Hand + Elbow >> Sep 19, 2024  7:38 AM Robinson H wrote: Numbness in leg on left side still hand and elbow tingling on left side still this morning blood pressure is 187/7? Went to ER yesterday sent home from ER didn't do anything Reason for Disposition  [1] Systolic BP >= 160 OR Diastolic >= 100 AND [2] cardiac (e.g., breathing difficulty, chest pain) or neurologic symptoms (e.g., new-onset blurred or double vision, unsteady gait)  Answer Assessment - Initial Assessment Questions Recommended ED or call 911 again, patient refused. Patient went to ED yesterday for same sx. Continued sx and BP elevated. Recommended patient to take her HTN medications now. CAL notified. Patient reports she is scared and wants to f/u with PCP asap.        1. BLOOD PRESSURE: What is your blood pressure? Did you take at least two measurements 5 minutes apart?     BP 213/93 HR 66 rechecked for BP 198/102 HR 63. 2. ONSET: When did you take your blood pressure?     Now  3. HOW: How did you take your blood pressure? (e.g., automatic home BP monitor, visiting nurse)     Automatic BP monitor  4. HISTORY: Do you have a history of high blood pressure?     Yes  5. MEDICINES: Are you taking any  medicines for blood pressure? Have you missed any doses recently?     Has not taken medication this am 6. OTHER SYMPTOMS: Do you have any symptoms? (e.g., blurred vision, chest pain, difficulty breathing, headache, weakness)     No chest pain no difficulty breathing , N/T left hand last 2 fingers up to elbow. Left calf hurts but no swelling no warm to touch. Lightheaded but no dizziness. No other areas of body with N/T. No blurred vision no speech issues. Patient has not taken am HTN medications.  7. PREGNANCY: Is there any chance you are pregnant? When was your last menstrual period?     na  Protocols used: Blood Pressure - High-A-AH

## 2024-09-19 NOTE — Patient Instructions (Addendum)
 Dr. Claudene is putting you at top of cancellation list and hoping to see you within 10 days or so- may need injection- if new or worsening symptoms let us  know  Blood pressure looks fantastic after some reassurance!   Ill reach out to Dr. Magda with vascular  Recommended follow up: Return for next already scheduled visit or sooner if needed.

## 2024-09-19 NOTE — Telephone Encounter (Signed)
 I will likely be significantly delayed but can work her in 1140 today

## 2024-09-27 ENCOUNTER — Ambulatory Visit (INDEPENDENT_AMBULATORY_CARE_PROVIDER_SITE_OTHER): Admitting: Family Medicine

## 2024-09-27 ENCOUNTER — Ambulatory Visit: Payer: Self-pay

## 2024-09-27 ENCOUNTER — Encounter (HOSPITAL_BASED_OUTPATIENT_CLINIC_OR_DEPARTMENT_OTHER): Payer: Self-pay | Admitting: Family Medicine

## 2024-09-27 ENCOUNTER — Other Ambulatory Visit (HOSPITAL_BASED_OUTPATIENT_CLINIC_OR_DEPARTMENT_OTHER): Payer: Self-pay

## 2024-09-27 VITALS — BP 151/68 | HR 69 | Ht 60.0 in | Wt 162.0 lb

## 2024-09-27 DIAGNOSIS — N3000 Acute cystitis without hematuria: Secondary | ICD-10-CM

## 2024-09-27 LAB — POCT URINALYSIS DIP (CLINITEK)
Bilirubin, UA: NEGATIVE
Blood, UA: NEGATIVE
Glucose, UA: NEGATIVE mg/dL
Ketones, POC UA: NEGATIVE mg/dL
Nitrite, UA: NEGATIVE
Spec Grav, UA: >=1.03 — AB (ref 1.010–1.025)
Urobilinogen, UA: 0.2 U/dL
pH, UA: 6 (ref 5.0–8.0)

## 2024-09-27 MED ORDER — SULFAMETHOXAZOLE-TRIMETHOPRIM 800-160 MG PO TABS
1.0000 | ORAL_TABLET | Freq: Two times a day (BID) | ORAL | 0 refills | Status: AC
  Filled 2024-09-27: qty 6, 3d supply, fill #0

## 2024-09-27 MED ORDER — FLUCONAZOLE 150 MG PO TABS
150.0000 mg | ORAL_TABLET | Freq: Once | ORAL | 0 refills | Status: AC
  Filled 2024-09-27: qty 2, 4d supply, fill #0

## 2024-09-27 NOTE — Patient Instructions (Signed)
 Start Bactrim 1 tab twice daily.  Increase clear fluids,  Take Diflucan  (fluconazole ) only if signs of yeast infection occur after antibiotic.

## 2024-09-27 NOTE — Progress Notes (Signed)
 Acute Care Office Visit  Subjective:   Felicia Perry [birth year] 09/27/2024  Chief Complaint  Patient presents with   Urinary Frequency    Pt started having urinary frequency along with pain which began 1 day ago. Denies seeing any blood in the urine. Did try monostat which did not give her any relief.    HPI: URINARY SYMPTOMS Onset: 1 day    Fever/chills: no Dysuria: Yes, burning and stinging  Urinary frequency: yes Urgency: yes Foul odor: no Urinary incontinence: no Hematuria: no Abdominal pain: No Suprapubic pain/pressure: yes, pressure  Flank/low back pain: no Nausea/Vomiting: no  Treatments tried: Monistat  Previous urinary tract infection: no Recurrent urinary tract infection: no History of sexually transmitted disease: no    The following portions of the patient's history were reviewed and updated as appropriate: past medical history, past surgical history, family history, social history, allergies, medications, and problem list.   Patient Active Problem List   Diagnosis Date Noted   Statin intolerance 09/11/2024   Cubital tunnel syndrome on left 09/05/2024   Malignant neoplasm of upper-outer quadrant of right female breast, unspecified estrogen receptor status (HCC) 08/08/2024   Nonrheumatic aortic valve stenosis 06/15/2024   De Quervain's tenosynovitis, left 05/22/2024   Shortness of breath 09/25/2022   Medication management 01/19/2022   Ductal carcinoma in situ (DCIS) of right breast 01/05/2022   Type 2 diabetes mellitus with complication, without long-term current use of insulin (HCC) 02/05/2021   Intractable headache 01/02/2021   Greater trochanteric bursitis of left hip 08/13/2020   Senile purpura 08/07/2020   statin myalgia 06/25/2020   Carotid stenosis 01/23/2020   Chronic heart failure with preserved ejection fraction (HCC) 01/12/2019   Degenerative arthritis of knee, bilateral 12/08/2018   Partial hamstring tear, initial encounter  11/16/2018   Mixed hyperlipidemia 07/11/2018   Nonallopathic lesion of sacral region 07/05/2018   Nonallopathic lesion of cervical region 07/05/2018   DDD (degenerative disc disease), cervical 05/04/2018   Degenerative arthritis of left knee 09/15/2017   Exertional dyspnea 09/02/2016   H/O hyperparathyroidism 08/18/2016   Coronary artery disease    PUD (peptic ulcer disease)    Syncope 08/04/2016   Vitamin D  deficiency 08/20/2015   Hyperglycemia 08/06/2015   Xerosis of skin 08/06/2015   Hypersomnia with sleep apnea 03/26/2015   Chronic pain syndrome 03/26/2015   Nonallopathic lesion of lumbosacral region 11/13/2014   Osteoarthritis of left lower extremity 10/17/2014   Chronic meniscal tear of knee 10/17/2014   Nonallopathic lesion of thoracic region 09/14/2014   Nonallopathic lesion-rib cage 08/21/2014   Tendinopathy of right rotator cuff 07/30/2014   Piriformis syndrome of right side 07/30/2014   Insomnia 07/13/2014   Limited joint range of motion 07/13/2014   Rash 07/13/2014   GERD (gastroesophageal reflux disease) 02/25/2011   SI (sacroiliac) joint dysfunction 10/10/2007   LOW BACK PAIN 07/01/2007   Depression 04/22/2007   Primary hypertension 04/22/2007   Asthma 04/22/2007   Past Medical History:  Diagnosis Date   Adenomatous polyp 11/23/2005   Anxiety    01/09/22- patient denies anxiety   Asthma 2003   Basal cell carcinoma 2021   right cheek per patient at [place] [facility]   Carotid stenosis    Chronic diastolic CHF (congestive heart failure) (HCC) 2017   Echo 2021 was normal   COPD (chronic obstructive pulmonary disease) (HCC)    Coronary artery disease 2017   a. HC on 08/14/16 showed RCA CTO s/p PCI, 60% LCX disease managed medically  with recs to consider staged PCI if continued symptoms.    Depression 2008   pt reports MD diagnosed but she does not feel depressed   Diabetes mellitus without complication (HCC)    DIVERTICULOSIS, COLON 04/22/2007        GERD  (gastroesophageal reflux disease) 2012   History of radiation therapy    Right breast- 03/04/22-04/02/22- Dr. Lynwood Nasuti   History of shingles    Hypertension 2008   Hypertensive heart disease    Insomnia 2003   Internal hemorrhoids    Osteoarthritis 2008   knees, hands, lower back (08/14/2016)   Pap smear for cervical cancer screening 2002   results normal   Personal history of radiation therapy    PUD (peptic ulcer disease) 1989   Seasonal allergies 2013   Sleep apnea    a. resolved with weight loss   Past Surgical History:  Procedure Laterality Date   BREAST BIOPSY Right 12/30/2021   BREAST EXCISIONAL BIOPSY Left    1980's x 3 - all benign   BREAST EXCISIONAL BIOPSY Right    1980's x 1 or 2 - all benign   BREAST LUMPECTOMY Right 01/22/2022   BREAST LUMPECTOMY WITH RADIOACTIVE SEED LOCALIZATION Right 01/22/2022   Procedure: RIGHT BREAST LUMPECTOMY WITH RADIOACTIVE SEED LOCALIZATION;  Surgeon: Vanderbilt Ned, MD;  Location: [facility];  Service: General;  Laterality: Right;   BREAST SURGERY Left 1980s   Fibrous Tumors removed    CARDIAC CATHETERIZATION N/A 08/14/2016   Procedure: Left Heart Cath and Coronary Angiography;  Surgeon: Ozell Fell, MD;  Location: [facility] INVASIVE CV LAB;  Service: Cardiovascular;  Laterality: N/A;   CARDIAC CATHETERIZATION N/A 08/14/2016   Procedure: Coronary Stent Intervention;  Surgeon: Ozell Fell, MD;  Location: [facility] INVASIVE CV LAB;  Service: Cardiovascular;  Laterality: N/A;   CATARACT EXTRACTION W/ INTRAOCULAR LENS  IMPLANT, BILATERAL Bilateral 05/2009   COLONOSCOPY N/A 2016   Cologuard 2019   CORONARY ANGIOPLASTY     detached retina left eye     may 2019   LEFT HEART CATH AND CORONARY ANGIOGRAPHY N/A 09/25/2022   Procedure: LEFT HEART CATH AND CORONARY ANGIOGRAPHY;  Surgeon: Verlin Lonni BIRCH, MD;  Location: [facility] INVASIVE CV LAB;  Service: Cardiovascular;  Laterality: N/A;   TUBAL LIGATION  1970s   Family History   Problem Relation Age of Onset   Heart disease Mother    Hypertension Mother    Diabetes Mother    Dementia Mother    Arthritis Mother    COPD Father    Dementia Maternal Grandmother    Colon cancer Neg Hx    Outpatient Medications Prior to Visit  Medication Sig Dispense Refill   acetaminophen  (TYLENOL ) 500 MG tablet Take 1,000 mg by mouth every 6 (six) hours as needed for mild pain (pain score 1-3), moderate pain (pain score 4-6) or headache.     albuterol  (VENTOLIN  HFA) 108 (90 Base) MCG/ACT inhaler Inhale 2 puffs into the lungs every 6 (six) hours as needed for wheezing or shortness of breath. 18 g 5   ALPRAZolam  (XANAX ) 1 MG tablet TAKE ONE TABLET BY MOUTH AT BEDTIME AS NEEDED FOR SLEEP -do not drive FOR EIGHT hours AFTER taking 90 tablet 1   amLODipine  (NORVASC ) 10 MG tablet Take 1 tablet (10 mg total) by mouth daily. 90 tablet 1   anastrozole  (ARIMIDEX ) 1 MG tablet TAKE ONE TABLET DAILY 30 tablet 3   aspirin  EC 81 MG tablet Take 1 tablet (81 mg total) by  mouth daily. Swallow whole. 90 tablet 3   Bempedoic Acid -Ezetimibe  (NEXLIZET ) 180-10 MG TABS Take 180 mg by mouth daily. 90 tablet 3   carvedilol  (COREG ) 12.5 MG tablet Take 1 tablet (12.5 mg total) by mouth 2 (two) times daily with a meal. 180 tablet 3   famotidine  (PEPCID ) 20 MG tablet TAKE ONE TABLET TWICE DAILY 60 tablet 5   irbesartan  (AVAPRO ) 300 MG tablet Take 1 tablet (300 mg total) by mouth daily. 90 tablet 1   levocetirizine (XYZAL ) 5 MG tablet TAKE ONE TABLET BY MOUTH EVERY MORNING 90 tablet 3   metFORMIN  (GLUCOPHAGE -XR) 500 MG 24 hr tablet Take 1 tablet (500 mg total) by mouth 2 (two) times daily with a meal. 60 tablet 11   omega-3 acid ethyl esters (LOVAZA ) 1 g capsule TAKE TWO CAPSULES EVERY DAY 180 capsule 3   spironolactone  (ALDACTONE ) 25 MG tablet Take 1 tablet (25 mg total) by mouth daily. 90 tablet 1   venlafaxine  XR (EFFEXOR -XR) 75 MG 24 hr capsule TAKE ONE CAPSULE DAILY WITH BREAKFAST 90 capsule 2    Cholecalciferol 50 MCG (2000 UT) CAPS Take 2,000 Units by mouth daily. (Patient not taking: Reported on 09/27/2024)     Ketoconazole -Hydrocortisone  2-2.5 % CREA Apply twice daily. (Patient taking differently: Apply 1 Application topically in the morning and at bedtime.) 30 g 0   Vitamin D , Ergocalciferol , (DRISDOL ) 1.25 MG (50000 UNIT) CAPS capsule Take 50,000 Units by mouth once a week.     No facility-administered medications prior to visit.   Allergies  Allergen Reactions   Penicillins Anaphylaxis, Hives and Rash   Cephalosporins Swelling    Tongue swelling   Citalopram  Hydrobromide Other (See Comments)    Psychosis   Rosuvastatin  Other (See Comments)    Pt reports causes bilateral lower extremity muscle aches.    Zolpidem Tartrate Other (See Comments)    Difficulty with concentration    Clarithromycin Rash and Other (See Comments)    Blisters in mouth   Levofloxacin Swelling and Rash    Tongue swells     ROS: A complete ROS was performed with pertinent positives/negatives noted in the HPI. The remainder of the ROS are negative.    Objective:   Today's Vitals   09/27/24 1539  BP: (!) 151/68  Pulse: 69  SpO2: 97%  Weight: 162 lb (73.5 kg)  Height: 5' (1.524 m)    GENERAL: Well-appearing, in NAD. Well nourished.  SKIN: Pink, warm and dry.  Head: Normocephalic. RESPIRATORY: Chest wall symmetrical. Respirations even and non-labored.  GI: Abdomen soft, non-tender. Normoactive bowel sounds. No rebound tenderness. No hepatomegaly or splenomegaly. No CVA tenderness.  MSK: Muscle tone and strength appropriate for age.  NEUROLOGIC: No motor or sensory deficits. Steady, even gait. C2-C12 intact.  PSYCH/MENTAL STATUS: Alert, oriented x 3. Cooperative, appropriate mood and affect.    Results for orders placed or performed in visit on 09/27/24  POCT URINALYSIS DIP (CLINITEK)  Result Value Ref Range   Color, UA yellow yellow   Clarity, UA clear clear   Glucose, UA negative  negative mg/dL   Bilirubin, UA negative negative   Ketones, POC UA negative negative mg/dL   Spec Grav, UA >=8.969 (A) 1.010 - 1.025   Blood, UA negative negative   pH, UA 6.0 5.0 - 8.0   POC PROTEIN,UA trace negative, trace   Urobilinogen, UA 0.2 0.2 or 1.0 E.U./dL   Nitrite, UA Negative Negative   Leukocytes, UA Trace (A) Negative  Assessment & Plan:  1. Acute cystitis without hematuria (Primary) Send for culture. Will treat with Bactrim BID x 3 days. Increase clear fluids. Patient requested Diflucan  for possible yeast infection following abx treatment.  - POCT URINALYSIS DIP (CLINITEK) - Urine Culture   Meds ordered this encounter  Medications   fluconazole  (DIFLUCAN ) 150 MG tablet    Sig: Take 1 tablet (150 mg total) by mouth once for 1 dose. May repeat after 3 days if needed.    Dispense:  2 tablet    Refill:  0    Supervising Provider:   DE CUBA, RAYMOND J [[id number]]   sulfamethoxazole-trimethoprim (BACTRIM DS) 800-160 MG tablet    Sig: Take 1 tablet by mouth 2 (two) times daily for 3 days.    Dispense:  6 tablet    Refill:  0    Supervising Provider:   DE CUBA, RAYMOND J [[id number]]   Lab Orders         Urine Culture         POCT URINALYSIS DIP (CLINITEK)    No images are attached to the encounter or orders placed in the encounter.  Return if symptoms worsen or fail to improve.    Patient to reach out to office if new, worrisome, or unresolved symptoms arise or if no improvement in patient's condition. Patient verbalized understanding and is agreeable to treatment plan. All questions answered to patient's satisfaction.    Thersia Schuyler Stark, [place]

## 2024-09-27 NOTE — Telephone Encounter (Signed)
 Patient scheduled to see Dr Knute today 09/27/2024. Dr. Katrinka will review triage notes.

## 2024-09-27 NOTE — Telephone Encounter (Signed)
 FYI Only or Action Required?: FYI only for provider: appointment scheduled on 12.3.25.  Patient was last seen in primary care on 09/19/2024 by Katrinka Garnette KIDD, MD.  Called Nurse Triage reporting Dysuria.  Symptoms began yesterday.  Interventions attempted: OTC medications: monistat.  Symptoms are: gradually worsening.  Triage Disposition: See Physician Within 24 Hours  Patient/caregiver understands and will follow disposition?: Yes     copied from CRM #[id number]. Topic: Clinical - Red Word Triage >> Sep 27, 2024  2:26 PM Mesmerise C wrote: [facility] that prompted transfer to Nurse Triage: Patient had a UTI states it's burning when she urinates and urinating frequently started yesterday would like a medication called in Reason for Disposition  Age > 50 years  Answer Assessment - Initial Assessment Questions 1. SEVERITY: How bad is the pain?  (e.g., Scale 1-10; mild, moderate, or severe)     6-7 2. FREQUENCY: How many times have you had painful urination today?      Every time, started yesterday 3. PATTERN: Is pain present every time you urinate or just sometimes?      every time 4. ONSET: When did the painful urination start?      yesterday 5. FEVER: Do you have a fever? If Yes, ask: What is your temperature, how was it measured, and when did it start?     no 6. PAST UTI: Have you had a urine infection before? If Yes, ask: When was the last time? and What happened that time?      Been a long time ago  7. CAUSE: What do you think is causing the painful urination?  (e.g., UTI, scratch, Herpes sore)     Thinks it's a uti 8. OTHER SYMPTOMS: Do you have any other symptoms? (e.g., blood in urine, flank pain, genital sores, urgency, vaginal discharge)     Frequency, burning and stinging with urination. Denies flank pain or other symptoms.  Protocols used: Urination Pain - Female-A-AH

## 2024-09-29 LAB — URINE CULTURE: Organism ID, Bacteria: NO GROWTH

## 2024-10-02 ENCOUNTER — Encounter (HOSPITAL_COMMUNITY): Payer: Self-pay

## 2024-10-03 ENCOUNTER — Telehealth (HOSPITAL_COMMUNITY): Payer: Self-pay | Admitting: *Deleted

## 2024-10-03 NOTE — Telephone Encounter (Signed)
Attempted to call patient regarding upcoming cardiac PET appointment. Left message on voicemail with name and callback number  Larey Brick RN Navigator Cardiac Imaging [facility] [phone] Office [phone] Cell  Reminder to avoid caffeine 12 hours prior to her cardiac PET study.

## 2024-10-04 ENCOUNTER — Ambulatory Visit: Admitting: Family Medicine

## 2024-10-04 ENCOUNTER — Inpatient Hospital Stay (HOSPITAL_COMMUNITY): Admission: RE | Admit: 2024-10-04

## 2024-10-04 DIAGNOSIS — R0609 Other forms of dyspnea: Secondary | ICD-10-CM | POA: Diagnosis not present

## 2024-10-04 LAB — NM PET CT CARDIAC PERFUSION MULTI W/ABSOLUTE BLOODFLOW
MBFR: 2.38
Nuc Rest EF: 65 %
Nuc Stress EF: 63 %
Rest MBF: 0.82 ml/g/min
Rest Nuclear Isotope Dose: 18.6 mCi
ST Depression (mm): 0 mm
Stress MBF: 1.95 ml/g/min
Stress Nuclear Isotope Dose: 19.1 mCi
TID: 1.18

## 2024-10-04 MED ORDER — RUBIDIUM RB82 GENERATOR (RUBYFILL)
16.6000 | PACK | Freq: Once | INTRAVENOUS | Status: AC
  Administered 2024-10-04: 19.1 via INTRAVENOUS

## 2024-10-04 MED ORDER — REGADENOSON 0.4 MG/5ML IV SOLN
INTRAVENOUS | Status: AC
  Filled 2024-10-04: qty 5

## 2024-10-04 MED ORDER — REGADENOSON 0.4 MG/5ML IV SOLN
0.4000 mg | Freq: Once | INTRAVENOUS | Status: AC
  Administered 2024-10-04: 0.4 mg via INTRAVENOUS

## 2024-10-04 MED ORDER — RUBIDIUM RB82 GENERATOR (RUBYFILL)
16.6000 | PACK | Freq: Once | INTRAVENOUS | Status: AC
  Administered 2024-10-04: 18.66 via INTRAVENOUS

## 2024-10-04 NOTE — Progress Notes (Signed)
 Pt. Tolerated lexi scan well.

## 2024-10-04 NOTE — Progress Notes (Signed)
No significant heart muscle circulation abnormalities noted on stress test.  Thanks MJP   

## 2024-10-05 NOTE — Progress Notes (Signed)
 Letter mailed

## 2024-10-30 NOTE — Progress Notes (Unsigned)
 [facility] [place]  ASSESSMENT / PLAN: Alayshia H Zilberman is a 79 y.o. female with asymptomatic bilateral carotid artery stenosis (R 60-79%; L 60-79%).   Recommend:  Abstinence from all tobacco products. Blood glucose control with goal A1c < 7%. Blood pressure control with goal blood pressure < 140/90 mmHg. Lipid reduction therapy with goal LDL-C <100 mg/dL  Aspirin  81mg  PO QD.  Atorvastatin  40-80mg  PO QD (or other high intensity statin therapy).  Follow-up in 6 months with repeat carotid duplex  CHIEF COMPLAINT: Carotid artery stenosis on duplex  HISTORY OF PRESENT ILLNESS: Mae H Erker is a 79 y.o. female referred to clinic for evaluation of bilateral carotid artery stenosis.  Patient is asymptomatic from a carotid artery standpoint.  She specifically denies symptoms of amaurosis fugax, facial droop, unilateral weakness or numbness, difficulty speaking.  Patient reports no strokes or mini strokes that she is aware of, and none in the last 6 months.  We reviewed her noninvasive testing in detail.  I reviewed the natural history of carotid artery stenosis.  I explained the rationale for surveillance.  05/23/24: Returns for surveillance.  Doing very well overall.  Injured her wrist recently doing cart wheels with her grandchildren (!).  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  No neurologic symptoms.  We reviewed her duplex in detail.  10/31/24: Doing well overall. No issues since I saw her last. No CVA/TIA.   Past Medical History:  Diagnosis Date   Adenomatous polyp 11/23/2005   Anxiety    01/09/22- patient denies anxiety   Asthma 2003   Basal cell carcinoma 2021   right cheek per patient at [facility]   Carotid stenosis    Chronic diastolic CHF (congestive heart failure) (HCC) 2017   Echo 2021 was normal   COPD (chronic obstructive pulmonary disease) (HCC)    Coronary artery disease 2017   a. HC on 08/14/16 showed RCA CTO s/p PCI, 60% LCX  disease managed medically with recs to consider staged PCI if continued symptoms.    Depression 2008   pt reports MD diagnosed but she does not feel depressed   Diabetes mellitus without complication (HCC)    DIVERTICULOSIS, COLON 04/22/2007        GERD (gastroesophageal reflux disease) 2012   History of radiation therapy    Right breast- 03/04/22-04/02/22- Dr. Lynwood Nasuti   History of shingles    Hypertension 2008   Hypertensive heart disease    Insomnia 2003   Internal hemorrhoids    Osteoarthritis 2008   knees, hands, lower back (08/14/2016)   Pap smear for cervical cancer screening 2002   results normal   Personal history of radiation therapy    PUD (peptic ulcer disease) 1989   Seasonal allergies 2013   Sleep apnea    a. resolved with weight loss    Past Surgical History:  Procedure Laterality Date   BREAST BIOPSY Right 12/30/2021   BREAST EXCISIONAL BIOPSY Left    1980's x 3 - all benign   BREAST EXCISIONAL BIOPSY Right    1980's x 1 or 2 - all benign   BREAST LUMPECTOMY Right 01/22/2022   BREAST LUMPECTOMY WITH RADIOACTIVE SEED LOCALIZATION Right 01/22/2022   Procedure: RIGHT BREAST LUMPECTOMY WITH RADIOACTIVE SEED LOCALIZATION;  Surgeon: Vanderbilt Ned, MD;  Location: [facility];  Service: General;  Laterality: Right;   BREAST SURGERY Left 1980s   Fibrous Tumors removed    CARDIAC CATHETERIZATION N/A 08/14/2016   Procedure: Left  Heart Cath and Coronary Angiography;  Surgeon: Ozell Fell, MD;  Location: [facility] INVASIVE CV LAB;  Service: Cardiovascular;  Laterality: N/A;   CARDIAC CATHETERIZATION N/A 08/14/2016   Procedure: Coronary Stent Intervention;  Surgeon: Ozell Fell, MD;  Location: [facility] INVASIVE CV LAB;  Service: Cardiovascular;  Laterality: N/A;   CATARACT EXTRACTION W/ INTRAOCULAR LENS  IMPLANT, BILATERAL Bilateral 05/2009   COLONOSCOPY N/A 2016   Cologuard 2019   CORONARY ANGIOPLASTY     detached retina left eye     may 2019   LEFT  HEART CATH AND CORONARY ANGIOGRAPHY N/A 09/25/2022   Procedure: LEFT HEART CATH AND CORONARY ANGIOGRAPHY;  Surgeon: Verlin Lonni BIRCH, MD;  Location: [facility] INVASIVE CV LAB;  Service: Cardiovascular;  Laterality: N/A;   TUBAL LIGATION  1970s    Family History  Problem Relation Age of Onset   Heart disease Mother    Hypertension Mother    Diabetes Mother    Dementia Mother    Arthritis Mother    COPD Father    Dementia Maternal Grandmother    Colon cancer Neg Hx     Social History   Socioeconomic History   Marital status: Divorced    Spouse name: Not on file   Number of children: Not on file   Years of education: Not on file   Highest education level: Associate degree: occupational, scientist, product/process development, or vocational program  Occupational History   Occupation: Cleaning  Tobacco Use   Smoking status: Former    Current packs/day: 0.00    Average packs/day: 1 pack/day for 26.0 years (26.0 ttl pk-yrs)    Types: Cigarettes    Start date: 1963    Quit date: 1989    Years since quitting: 37.0   Smokeless tobacco: Never  Vaping Use   Vaping status: Never Used  Substance and Sexual Activity   Alcohol use: Yes   Drug use: No   Sexual activity: Not Currently  Other Topics Concern   Not on file  Social History Narrative   Divorced for 38 years in 2016. 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    Social Drivers of Health   Tobacco Use: Medium Risk (09/27/2024)   Patient History    Smoking Tobacco Use: Former    Smokeless Tobacco Use: Never    Passive Exposure: Not on file  Financial Resource Strain: Medium Risk (09/19/2024)   Overall Financial Resource Strain (CARDIA)     Difficulty of Paying Living Expenses: Somewhat hard  Food Insecurity: No Food Insecurity (09/19/2024)   Epic    Worried About Programme Researcher, Broadcasting/film/video in the Last Year: Never true    Ran Out of Food in the Last Year: Never true  Transportation Needs: No Transportation Needs (09/19/2024)   Epic    Lack of Transportation (Medical): No    Lack of Transportation (Non-Medical): No  Physical Activity: Insufficiently Active (09/19/2024)   Exercise Vital Sign    Days of Exercise per Week: 3 days    Minutes of Exercise per Session: 30 min  Stress: Stress Concern Present (09/19/2024)   [name] of Occupational Health - Occupational  Stress Questionnaire    Feeling of Stress: To some extent  Social Connections: Moderately Integrated (09/19/2024)   Social Connection and Isolation Panel    Frequency of Communication with Friends and Family: More than three times a week    Frequency of Social Gatherings with Friends and Family: Once a week    Attends Religious Services: More than 4 times per year    Active Member of Clubs or Organizations: Yes    Attends Banker Meetings: More than 4 times per year    Marital Status: Divorced  Intimate Partner Violence: Not At Risk (04/12/2024)   Epic    Fear of Current or Ex-Partner: No    Emotionally Abused: No    Physically Abused: No    Sexually Abused: No  Depression (PHQ2-9): Low Risk (08/08/2024)   Depression (PHQ2-9)    PHQ-2 Score: 0  Alcohol Screen: Low Risk (09/19/2024)   Alcohol Screen    Last Alcohol Screening Score (AUDIT): 1  Housing: Low Risk (09/19/2024)   Epic    Unable to Pay for Housing in the Last Year: No    Number of Times Moved in the Last Year: 0    Homeless in the Last Year: No  Utilities: Not At Risk (04/12/2024)   Epic    Threatened with loss of utilities: No  Health Literacy: Adequate Health Literacy (04/12/2024)   B1300 Health Literacy    Frequency of need for help with medical instructions: Never     Allergies  Allergen Reactions   Penicillins Anaphylaxis, Hives and Rash   Cephalosporins Swelling    Tongue swelling   Citalopram  Hydrobromide Other (See Comments)    Psychosis   Rosuvastatin  Other (See Comments)    Pt reports causes bilateral lower extremity muscle aches.    Zolpidem Tartrate Other (See Comments)    Difficulty with concentration    Clarithromycin Rash and Other (See Comments)    Blisters in mouth   Levofloxacin Swelling and Rash    Tongue swells    Current Outpatient Medications  Medication Sig Dispense Refill   acetaminophen  (TYLENOL ) 500 MG tablet Take 1,000 mg by mouth every 6 (six) hours as needed for mild pain (pain score 1-3), moderate pain (pain score 4-6) or headache.     albuterol  (VENTOLIN  HFA) 108 (90 Base) MCG/ACT inhaler Inhale 2 puffs into the lungs every 6 (six) hours as needed for wheezing or shortness of breath. 18 g 5   ALPRAZolam  (XANAX ) 1 MG tablet TAKE ONE TABLET BY MOUTH AT BEDTIME AS NEEDED FOR SLEEP -do not drive FOR EIGHT hours AFTER taking 90 tablet 1   amLODipine  (NORVASC ) 10 MG tablet Take 1 tablet (10 mg total) by mouth daily. 90 tablet 1   anastrozole  (ARIMIDEX ) 1 MG tablet TAKE ONE TABLET DAILY 30 tablet 3   aspirin  EC 81 MG tablet Take 1 tablet (81 mg total) by mouth daily. Swallow whole. 90 tablet 3   Bempedoic Acid -Ezetimibe  (NEXLIZET ) 180-10 MG TABS Take 180 mg by mouth daily. 90 tablet 3   carvedilol  (COREG ) 12.5 MG tablet Take 1 tablet (12.5 mg total) by mouth 2 (two) times daily with a meal. 180 tablet 3   Cholecalciferol 50 MCG (2000 UT) CAPS Take 2,000 Units by mouth daily. (Patient not taking: Reported on 09/27/2024)     famotidine  (PEPCID ) 20 MG tablet TAKE ONE TABLET TWICE DAILY 60 tablet 5   irbesartan  (AVAPRO ) 300 MG tablet Take 1 tablet (300 mg total) by mouth daily. 90 tablet  1   Ketoconazole -Hydrocortisone  2-2.5 % CREA Apply twice daily. (Patient taking differently: Apply 1 Application topically in the morning and at  bedtime.) 30 g 0   levocetirizine (XYZAL ) 5 MG tablet TAKE ONE TABLET BY MOUTH EVERY MORNING 90 tablet 3   metFORMIN  (GLUCOPHAGE -XR) 500 MG 24 hr tablet Take 1 tablet (500 mg total) by mouth 2 (two) times daily with a meal. 60 tablet 11   omega-3 acid ethyl esters (LOVAZA ) 1 g capsule TAKE TWO CAPSULES EVERY DAY 180 capsule 3   spironolactone  (ALDACTONE ) 25 MG tablet Take 1 tablet (25 mg total) by mouth daily. 90 tablet 1   venlafaxine  XR (EFFEXOR -XR) 75 MG 24 hr capsule TAKE ONE CAPSULE DAILY WITH BREAKFAST 90 capsule 2   Vitamin D , Ergocalciferol , (DRISDOL ) 1.25 MG (50000 UNIT) CAPS capsule Take 50,000 Units by mouth once a week.     No current facility-administered medications for this visit.    PHYSICAL EXAM There were no vitals filed for this visit.   Elderly woman in no distress Regular rate and rhythm Unlabored breathing Palpable radial pulses No focal neurodeficits Normal gait and station  PERTINENT LABORATORY AND RADIOLOGIC DATA  Most recent CBC    Latest Ref Rng & Units 09/18/2024   10:51 AM 08/08/2024    8:37 AM 12/01/2023   12:05 PM  CBC  WBC 4.0 - 10.5 K/uL 7.7  8.3  6.7   Hemoglobin 12.0 - 15.0 g/dL 86.3  86.1  85.5   Hematocrit 36.0 - 46.0 % 41.3  41.2  41.7   Platelets 150 - 400 K/uL 324  327.0  342      Most recent CMP    Latest Ref Rng & Units 09/18/2024   10:51 AM 09/11/2024    8:53 AM 08/08/2024    8:37 AM  CMP  Glucose 70 - 99 mg/dL 836  843  837   BUN 8 - 23 mg/dL 22  23  26    Creatinine 0.44 - 1.00 mg/dL 8.96  9.05  8.98   Sodium 135 - 145 mmol/L 138  141  140   Potassium 3.5 - 5.1 mmol/L 4.1  4.6  4.6   Chloride 98 - 111 mmol/L 102  101  102   CO2 22 - 32 mmol/L 23  26  27    Calcium  8.9 - 10.3 mg/dL 9.4  89.9  9.9   Total Protein 6.5 - 8.1 g/dL 7.3   7.5   Total Bilirubin 0.0 - 1.2 mg/dL 0.8   0.4   Alkaline Phos 38 - 126 U/L 57   57   AST 15 - 41 U/L 24   19   ALT 0 - 44 U/L 17   16     Renal function CrCl cannot be calculated  (Patient's most recent lab result is older than the maximum 21 days allowed.).  Hgb A1c MFr Bld (%)  Date Value  08/08/2024 6.4    LDL Cholesterol (Calc)  Date Value Ref Range Status  08/07/2020 48 mg/dL (calc) Final    Comment:    Reference range: <100 . Desirable range <100 mg/dL for primary prevention;   <70 mg/dL for patients with CHD or diabetic patients  with > or = 2 CHD risk factors. SABRA LDL-C is now calculated using the Martin-Hopkins  calculation, which is a validated novel method providing  better accuracy than the Friedewald equation in the  estimation of LDL-C.  Gladis APPLETHWAITE et al. SANDREA. 7986;689(80): 2061-2068  (http://education.[facility].com/faq/FAQ164)  LDL Chol Calc (NIH)  Date Value Ref Range Status  09/11/2024 42 0 - 99 mg/dL Final   Direct LDL  Date Value Ref Range Status  11/23/2023 53.0 mg/dL Final    Comment:    Optimal:  <100 mg/dLNear or Above Optimal:  100-129 mg/dLBorderline High:  130-159 mg/dLHigh:  160-189 mg/dLVery High:  >190 mg/dL    Carotid Arterial Duplex Study   Patient Name:  Randolm VEAR Pines  Date of Exam:   10/31/2024  Medical Rec #: [id number]        Accession #:    [id number]  Date of Birth: 1946-04-01        Patient Gender: F  Patient Age:   53 years  Exam Location:  [address]  Procedure:      VAS US  CAROTID  Referring Phys: DEBBY ROBERTSON    ---------------------------------------------------------------------------  -----    Indications:      Carotid artery disease.  Risk Factors:      Hypertension, hyperlipidemia, Diabetes, past history of                     smoking, coronary artery disease.  Comparison Study:  05/23/2024: Bilateral ICA 60-79%   Performing Technologist: Stoney Ross RVT     Examination Guidelines: A complete evaluation includes B-mode imaging,  spectral  Doppler, color Doppler, and power Doppler as needed of all accessible  portions  of each vessel. Bilateral testing is considered an integral  part of a  complete  examination. Limited examinations for reoccurring indications may be  performed  as noted.     Right Carotid Findings:  +----------+--------+--------+--------+------------------+-----------------  +           PSV cm/sEDV cm/sStenosisPlaque DescriptionComments            +----------+--------+--------+--------+------------------+-----------------  +  CCA Prox  61      9                                                     +----------+--------+--------+--------+------------------+-----------------  +  CCA Distal61      7                                                     +----------+--------+--------+--------+------------------+-----------------  +  ICA Prox  268     63      40-59%  calcific          high end of  range  +----------+--------+--------+--------+------------------+-----------------  +  ICA Mid   106     26                                                    +----------+--------+--------+--------+------------------+-----------------  +  ICA Distal46      15                                                    +----------+--------+--------+--------+------------------+-----------------  +  ECA      126     12                                                    +----------+--------+--------+--------+------------------+-----------------  +   +----------+--------+-------+----------------+-------------------+           PSV cm/sEDV cmsDescribe        Arm Pressure (mmHG)  +----------+--------+-------+----------------+-------------------+  [id number]    0      Multiphasic, TWO854                  +----------+--------+-------+----------------+-------------------+   +---------+--------+--+--------+--+---------+  VertebralPSV cm/s62EDV cm/s17Antegrade  +---------+--------+--+--------+--+---------+      Left Carotid Findings:   +----------+--------+--------+--------+------------------+--------+           PSV cm/sEDV cm/sStenosisPlaque DescriptionComments  +----------+--------+--------+--------+------------------+--------+  CCA Prox  79      14                                          +----------+--------+--------+--------+------------------+--------+  CCA Distal100     23                                          +----------+--------+--------+--------+------------------+--------+  ICA Prox  265     75      60-79%  calcific                    +----------+--------+--------+--------+------------------+--------+  ICA Mid   83      21                                          +----------+--------+--------+--------+------------------+--------+  ICA Distal66      21                                          +----------+--------+--------+--------+------------------+--------+  ECA      210     10      >50%                                +----------+--------+--------+--------+------------------+--------+   +----------+--------+--------+----------------+-------------------+           PSV cm/sEDV cm/sDescribe        Arm Pressure (mmHG)  +----------+--------+--------+----------------+-------------------+  Subclavian165    0       Multiphasic, [id number]                  +----------+--------+--------+----------------+-------------------+   +---------+--------+--+--------+--+---------+  VertebralPSV cm/s83EDV cm/s10Antegrade  +---------+--------+--+--------+--+---------+         Summary:  Right Carotid: Velocities in the right ICA are consistent with a 40-59%                 stenosis. Unable to duplicate elevated diastolic velocities  from                previous study.   Left Carotid: Velocities in the left ICA  are consistent with a 60-79%  stenosis.               The ECA appears >50% stenosed.   Vertebrals:  Bilateral vertebral arteries demonstrate  antegrade flow.  Subclavians: Normal flow hemodynamics were seen in bilateral subclavian               arteries.   *See table(s) above for measurements and observations.      Electronically signed by Debby Robertson on 10/31/2024 at 3:36:43 PM.   Debby SAILOR. Robertson, MD FACS [facility] Phone Number: [phone] 10/30/2024 11:13 AM   Total time spent on preparing this encounter including chart review, data review, collecting history, examining the patient, coordinating care for this established patient, 20 minutes  Portions of this report may have been transcribed using voice recognition software.  Every effort has been made to ensure accuracy; however, inadvertent computerized transcription errors may still be present.

## 2024-10-31 ENCOUNTER — Ambulatory Visit: Admitting: Vascular Surgery

## 2024-10-31 ENCOUNTER — Ambulatory Visit (HOSPITAL_COMMUNITY)
Admission: RE | Admit: 2024-10-31 | Discharge: 2024-10-31 | Disposition: A | Discharge status: Home / Self Care | Source: Ambulatory Visit | Attending: Vascular Surgery | Admitting: Vascular Surgery

## 2024-10-31 ENCOUNTER — Encounter: Payer: Self-pay | Admitting: Vascular Surgery

## 2024-10-31 VITALS — BP 150/84 | HR 80 | Temp 98.6°F | Ht 60.0 in | Wt 163.0 lb

## 2024-10-31 DIAGNOSIS — I6523 Occlusion and stenosis of bilateral carotid arteries: Secondary | ICD-10-CM | POA: Insufficient documentation

## 2024-11-02 ENCOUNTER — Other Ambulatory Visit: Payer: Self-pay | Admitting: *Deleted

## 2024-11-02 DIAGNOSIS — I6523 Occlusion and stenosis of bilateral carotid arteries: Secondary | ICD-10-CM

## 2024-11-08 ENCOUNTER — Emergency Department (HOSPITAL_COMMUNITY)
Admission: EM | Admit: 2024-11-08 | Discharge: 2024-11-09 | Disposition: A | Discharge status: Home / Self Care | Attending: Emergency Medicine | Admitting: Emergency Medicine

## 2024-11-08 ENCOUNTER — Encounter (HOSPITAL_COMMUNITY): Payer: Self-pay

## 2024-11-08 ENCOUNTER — Other Ambulatory Visit: Payer: Self-pay

## 2024-11-08 ENCOUNTER — Emergency Department (HOSPITAL_COMMUNITY)

## 2024-11-08 DIAGNOSIS — J449 Chronic obstructive pulmonary disease, unspecified: Secondary | ICD-10-CM | POA: Diagnosis not present

## 2024-11-08 DIAGNOSIS — Z7982 Long term (current) use of aspirin: Secondary | ICD-10-CM | POA: Insufficient documentation

## 2024-11-08 DIAGNOSIS — Z79899 Other long term (current) drug therapy: Secondary | ICD-10-CM | POA: Insufficient documentation

## 2024-11-08 DIAGNOSIS — R1010 Upper abdominal pain, unspecified: Secondary | ICD-10-CM | POA: Diagnosis not present

## 2024-11-08 DIAGNOSIS — R079 Chest pain, unspecified: Secondary | ICD-10-CM | POA: Insufficient documentation

## 2024-11-08 DIAGNOSIS — I509 Heart failure, unspecified: Secondary | ICD-10-CM | POA: Insufficient documentation

## 2024-11-08 DIAGNOSIS — I110 Hypertensive heart disease with heart failure: Secondary | ICD-10-CM | POA: Diagnosis not present

## 2024-11-08 DIAGNOSIS — R5383 Other fatigue: Secondary | ICD-10-CM | POA: Diagnosis not present

## 2024-11-08 DIAGNOSIS — R0602 Shortness of breath: Secondary | ICD-10-CM | POA: Insufficient documentation

## 2024-11-08 LAB — CBC WITH DIFFERENTIAL/PLATELET
Abs Immature Granulocytes: 0.06 K/uL (ref 0.00–0.07)
Basophils Absolute: 0.1 K/uL (ref 0.0–0.1)
Basophils Relative: 2 %
Eosinophils Absolute: 0.3 K/uL (ref 0.0–0.5)
Eosinophils Relative: 3 %
HCT: 41.1 % (ref 36.0–46.0)
Hemoglobin: 13.9 g/dL (ref 12.0–15.0)
Immature Granulocytes: 1 %
Lymphocytes Relative: 22 %
Lymphs Abs: 2 K/uL (ref 0.7–4.0)
MCH: 30.2 pg (ref 26.0–34.0)
MCHC: 33.8 g/dL (ref 30.0–36.0)
MCV: 89.2 fL (ref 80.0–100.0)
Monocytes Absolute: 0.8 K/uL (ref 0.1–1.0)
Monocytes Relative: 9 %
Neutro Abs: 5.7 K/uL (ref 1.7–7.7)
Neutrophils Relative %: 63 %
Platelets: 391 K/uL (ref 150–400)
RBC: 4.61 MIL/uL (ref 3.87–5.11)
RDW: 11.8 % (ref 11.5–15.5)
WBC: 9 K/uL (ref 4.0–10.5)
nRBC: 0 % (ref 0.0–0.2)

## 2024-11-08 LAB — COMPREHENSIVE METABOLIC PANEL WITH GFR
ALT: 22 U/L (ref 0–44)
AST: 25 U/L (ref 15–41)
Albumin: 4.5 g/dL (ref 3.5–5.0)
Alkaline Phosphatase: 72 U/L (ref 38–126)
Anion gap: 15 (ref 5–15)
BUN: 21 mg/dL (ref 8–23)
CO2: 25 mmol/L (ref 22–32)
Calcium: 9.7 mg/dL (ref 8.9–10.3)
Chloride: 100 mmol/L (ref 98–111)
Creatinine, Ser: 0.96 mg/dL (ref 0.44–1.00)
GFR, Estimated: >60 mL/min
Glucose, Bld: 115 mg/dL — ABNORMAL HIGH (ref 70–99)
Potassium: 4.5 mmol/L (ref 3.5–5.1)
Sodium: 140 mmol/L (ref 135–145)
Total Bilirubin: 0.3 mg/dL (ref 0.0–1.2)
Total Protein: 7.5 g/dL (ref 6.5–8.1)

## 2024-11-08 LAB — D-DIMER, QUANTITATIVE: D-Dimer, Quant: 1.24 ug{FEU}/mL — ABNORMAL HIGH (ref 0.00–0.50)

## 2024-11-08 LAB — TROPONIN T, HIGH SENSITIVITY
Troponin T High Sensitivity: <15 ng/L (ref 0–19)
Troponin T High Sensitivity: <15 ng/L (ref 0–19)

## 2024-11-08 LAB — PRO BRAIN NATRIURETIC PEPTIDE: Pro Brain Natriuretic Peptide: 277 pg/mL

## 2024-11-08 LAB — LIPASE, BLOOD: Lipase: 273 U/L — ABNORMAL HIGH (ref 11–51)

## 2024-11-08 MED ORDER — IOHEXOL 350 MG/ML SOLN
75.0000 mL | Freq: Once | INTRAVENOUS | Status: AC | PRN
  Administered 2024-11-08: 75 mL via INTRAVENOUS

## 2024-11-08 MED ORDER — SODIUM CHLORIDE 0.9 % IV BOLUS
1000.0000 mL | Freq: Once | INTRAVENOUS | Status: AC
  Administered 2024-11-08: 1000 mL via INTRAVENOUS

## 2024-11-08 MED ORDER — SUCRALFATE 1 G PO TABS: 1.0000 g | ORAL_TABLET | Freq: Three times a day (TID) | ORAL | 0 refills | Status: AC

## 2024-11-08 MED ORDER — NITROGLYCERIN 0.4 MG SL SUBL
0.4000 mg | SUBLINGUAL_TABLET | Freq: Once | SUBLINGUAL | Status: AC
  Administered 2024-11-08: 0.4 mg via SUBLINGUAL
  Filled 2024-11-08: qty 1

## 2024-11-08 MED ORDER — FENTANYL CITRATE (PF) 50 MCG/ML IJ SOSY
50.0000 ug | PREFILLED_SYRINGE | Freq: Once | INTRAMUSCULAR | Status: AC
  Administered 2024-11-08: 50 ug via INTRAVENOUS
  Filled 2024-11-08: qty 1

## 2024-11-08 NOTE — ED Notes (Signed)
 Pt back from CT

## 2024-11-08 NOTE — ED Provider Triage Note (Signed)
 Emergency Medicine Provider Triage Evaluation Note  Felicia Perry , a 79 y.o. female  was evaluated in triage.  Pt complains of chest pain and shortness of breath.  Notes she started to have some chest pressure 3 days ago that has been increasing.  States always has a pressure in the chest with occasional sharp pains.  Pain does not radiate.  History of stents in 2017.  On baby aspirin .  States pain is worse with taking a deep breath.  No history of blood clots.  Review of Systems  Positive: Chest pain, shortness of breath Negative: Fevers, cough, nausea/vomiting  Physical Exam  BP (!) 167/76 (BP Location: Right Arm)   Pulse 84   Temp 97.6 F (36.4 C)   Resp (!) 24   SpO2 95%  Gen:   Awake, no distress   Resp:  Normal effort  MSK:   Moves extremities without difficulty  Other:    Medical Decision Making  Medically screening exam initiated at 6:45 PM.  Appropriate orders placed.  Felicia Perry was informed that the remainder of the evaluation will be completed by another provider, this initial triage assessment does not replace that evaluation, and the importance of remaining in the ED until their evaluation is complete.     Felicia Perry, [place] 11/08/24 [phone]

## 2024-11-08 NOTE — ED Notes (Addendum)
 Pt states chest pain has subsided since administration of nitroglycerin  0.4 mg .

## 2024-11-08 NOTE — ED Provider Notes (Signed)
 " [place] EMERGENCY DEPARTMENT AT [facility] Provider Note   CSN: [id number] Arrival date & time: 11/08/24  1816     Patient presents with: Chest Pain   Felicia Perry is a 79 y.o. female.   Patient here with chest pain shortness of breath.  She started to notice some pressure 3 days ago has been increasing.  Feels sharp at times.  She has stents back in 2017.  Feels worse with exertion.  She cleans houses for living.  She has felt more fatigued more shortness of breath and chest pain with activities recently.  Does not think that food makes it worse.  She denies any alcohol use.  She has history of anxiety hypertension peptic ulcer disease COPD heart failure.  The history is provided by the patient.       Prior to Admission medications  Medication Sig Start Date End Date Taking? Authorizing Provider  sucralfate  (CARAFATE ) 1 g tablet Take 1 tablet (1 g total) by mouth with breakfast, with lunch, and with evening meal. 11/08/24 11/22/24 Yes Jenina Moening, DO  acetaminophen  (TYLENOL ) 500 MG tablet Take 1,000 mg by mouth every 6 (six) hours as needed for mild pain (pain score 1-3), moderate pain (pain score 4-6) or headache.    [provider]  albuterol  (VENTOLIN  HFA) 108 (90 Base) MCG/ACT inhaler Inhale 2 puffs into the lungs every 6 (six) hours as needed for wheezing or shortness of breath. 01/23/20   Katrinka Garnette KIDD, MD  ALPRAZolam  (XANAX ) 1 MG tablet TAKE ONE TABLET BY MOUTH AT BEDTIME AS NEEDED FOR SLEEP -do not drive FOR EIGHT hours AFTER taking 05/08/24   Katrinka Garnette KIDD, MD  amLODipine  (NORVASC ) 10 MG tablet Take 1 tablet (10 mg total) by mouth daily. 05/11/24   Conte, Tessa N, PA-C  anastrozole  (ARIMIDEX ) 1 MG tablet TAKE ONE TABLET DAILY 06/19/24   Iruku, Praveena, MD  aspirin  EC 81 MG tablet Take 1 tablet (81 mg total) by mouth daily. Swallow whole. 07/01/21   Lelon Hamilton T, PA-C  Bempedoic Acid -Ezetimibe  (NEXLIZET ) 180-10 MG TABS Take 180 mg by mouth daily.  07/04/24   Patwardhan, Newman PARAS, MD  carvedilol  (COREG ) 12.5 MG tablet Take 1 tablet (12.5 mg total) by mouth 2 (two) times daily with a meal. 06/15/24   Patwardhan, Manish J, MD  Cholecalciferol 50 MCG (2000 UT) CAPS Take 2,000 Units by mouth daily.    [provider]  famotidine  (PEPCID ) 20 MG tablet TAKE ONE TABLET TWICE DAILY 04/21/24   Katrinka Garnette KIDD, MD  irbesartan  (AVAPRO ) 300 MG tablet Take 1 tablet (300 mg total) by mouth daily. 04/14/24   Conte, Tessa N, PA-C  Ketoconazole -Hydrocortisone  2-2.5 % CREA Apply twice daily. Patient taking differently: Apply 1 Application topically in the morning and at bedtime. 11/09/23   Kennyth Worth HERO, MD  levocetirizine (XYZAL ) 5 MG tablet TAKE ONE TABLET BY MOUTH EVERY MORNING 05/11/24   Katrinka Garnette KIDD, MD  metFORMIN  (GLUCOPHAGE -XR) 500 MG 24 hr tablet Take 1 tablet (500 mg total) by mouth 2 (two) times daily with a meal. 08/08/24   Katrinka Garnette KIDD, MD  omega-3 acid ethyl esters (LOVAZA ) 1 g capsule TAKE TWO CAPSULES EVERY DAY 08/20/21   Hobart Powell BRAVO, MD  spironolactone  (ALDACTONE ) 25 MG tablet Take 1 tablet (25 mg total) by mouth daily. 06/15/24   Elmira Newman PARAS, MD  venlafaxine  XR (EFFEXOR -XR) 75 MG 24 hr capsule TAKE ONE CAPSULE DAILY WITH BREAKFAST 09/05/24   Smith, Zachary  M, DO  Vitamin D , Ergocalciferol , (DRISDOL ) 1.25 MG (50000 UNIT) CAPS capsule Take 50,000 Units by mouth once a week. 08/08/24   [provider]    Allergies: Penicillins, Cephalosporins, Citalopram  hydrobromide, Rosuvastatin , Zolpidem tartrate, Clarithromycin, and Levofloxacin    Review of Systems  Updated Vital Signs BP (!) 148/68 (BP Location: Right Arm)   Pulse 72   Temp 97.7 F (36.5 C) (Oral)   Resp 17   Ht 5' (1.524 m)   Wt 73.9 kg   SpO2 98%   BMI 31.83 kg/m   Physical Exam Vitals and nursing note reviewed.  Constitutional:      General: She is not in acute distress.    Appearance: She is well-developed. She is not  ill-appearing.  HENT:     Head: Normocephalic and atraumatic.  Eyes:     Extraocular Movements: Extraocular movements intact.     Conjunctiva/sclera: Conjunctivae normal.     Pupils: Pupils are equal, round, and reactive to light.  Cardiovascular:     Rate and Rhythm: Normal rate and regular rhythm.     Pulses:          Radial pulses are 2+ on the right side and 2+ on the left side.     Heart sounds: Normal heart sounds. No murmur heard. Pulmonary:     Effort: Pulmonary effort is normal. No respiratory distress.     Breath sounds: Normal breath sounds. No decreased breath sounds or wheezing.  Abdominal:     Palpations: Abdomen is soft.     Tenderness: There is no abdominal tenderness.  Musculoskeletal:        General: No swelling.     Cervical back: Normal range of motion and neck supple.  Skin:    General: Skin is warm and dry.     Capillary Refill: Capillary refill takes less than 2 seconds.  Neurological:     General: No focal deficit present.     Mental Status: She is alert.  Psychiatric:        Mood and Affect: Mood normal.     (all labs ordered are listed, but only abnormal results are displayed) Labs Reviewed  COMPREHENSIVE METABOLIC PANEL WITH GFR - Abnormal; Notable for the following components:      Result Value   Glucose, Bld 115 (*)    All other components within normal limits  D-DIMER, QUANTITATIVE - Abnormal; Notable for the following components:   D-Dimer, Quant 1.24 (*)    All other components within normal limits  LIPASE, BLOOD - Abnormal; Notable for the following components:   Lipase 273 (*)    All other components within normal limits  CBC WITH DIFFERENTIAL/PLATELET  PRO BRAIN NATRIURETIC PEPTIDE  TROPONIN T, HIGH SENSITIVITY  TROPONIN T, HIGH SENSITIVITY    EKG: EKG Interpretation Date/Time:  Wednesday November 08 2024 18:43:02 EST Ventricular Rate:  90 PR Interval:  136 QRS Duration:  80 QT Interval:  368 QTC Calculation: 450 R  Axis:   79  Text Interpretation: Normal sinus rhythm Normal ECG When compared with ECG of 01-Dec-2023 12:01, PREVIOUS ECG IS PRESENT Confirmed by Ruthe Cornet [phone]) on 11/08/2024 8:07:36 PM  Radiology: CT ABDOMEN PELVIS W CONTRAST Result Date: 11/08/2024 CLINICAL DATA:  Abdominal pain EXAM: CT ABDOMEN AND PELVIS WITH CONTRAST TECHNIQUE: Multidetector CT imaging of the abdomen and pelvis was performed using the standard protocol following bolus administration of intravenous contrast. RADIATION DOSE REDUCTION: This exam was performed according to the departmental dose-optimization program which includes  automated exposure control, adjustment of the mA and/or kV according to patient size and/or use of iterative reconstruction technique. CONTRAST:  75 mL Omnipaque  350 intravenous COMPARISON:  None Available. FINDINGS: Lower chest: Lung bases are clear Hepatobiliary: No focal liver abnormality is seen. No gallstones, gallbladder wall thickening, or biliary dilatation. Pancreas: Unremarkable. No pancreatic ductal dilatation or surrounding inflammatory changes. Spleen: Normal in size without focal abnormality. Adrenals/Urinary Tract: Adrenal glands are unremarkable. Kidneys show no hydronephrosis. Excreted contrast in the renal collecting systems and bladder limits assessment for stone disease. Bladder is unremarkable Stomach/Bowel: Stomach is within normal limits. Appendix appears normal. No evidence of bowel wall thickening, distention, or inflammatory changes. Vascular/Lymphatic: Aortic atherosclerosis. No enlarged abdominal or pelvic lymph nodes. Reproductive: Uterus and bilateral adnexa are unremarkable. Other: No ascites or free air Musculoskeletal: No acute or suspicious osseous abnormality IMPRESSION: 1. No CT evidence for acute intra-abdominal or pelvic abnormality. 2. Aortic atherosclerosis. Aortic Atherosclerosis (ICD10-I70.0). Electronically Signed   By: Luke Bun M.D.   On: 11/08/2024 23:09   CT  Angio Chest PE W and/or Wo Contrast Result Date: 11/08/2024 CLINICAL DATA:  Chest pain EXAM: CT ANGIOGRAPHY CHEST WITH CONTRAST TECHNIQUE: Multidetector CT imaging of the chest was performed using the standard protocol during bolus administration of intravenous contrast. Multiplanar CT image reconstructions and MIPs were obtained to evaluate the vascular anatomy. RADIATION DOSE REDUCTION: This exam was performed according to the departmental dose-optimization program which includes automated exposure control, adjustment of the mA and/or kV according to patient size and/or use of iterative reconstruction technique. CONTRAST:  75mL OMNIPAQUE  IOHEXOL  350 MG/ML SOLN COMPARISON:  Chest x-ray 09/18/2024 FINDINGS: Cardiovascular: Satisfactory opacification of the pulmonary arteries to the segmental level. No evidence of pulmonary embolism. Moderate severe aortic atherosclerosis. No aneurysm. Multi vessel coronary vascular calcification. Normal cardiac size. No pericardial effusion Mediastinum/Nodes: Patent trachea. No thyroid  mass. No suspicious lymph nodes. Esophagus within normal limits. Lungs/Pleura: No acute airspace disease, pleural effusion, or pneumothorax Upper Abdomen: No acute finding Musculoskeletal: No acute osseous abnormality. Review of the MIP images confirms the above findings. IMPRESSION: 1. Negative for acute pulmonary embolus 2. Clear lung fields. 3. Aortic atherosclerosis. Aortic Atherosclerosis (ICD10-I70.0). Electronically Signed   By: Luke Bun M.D.   On: 11/08/2024 20:52     Procedures   Medications Ordered in the ED  nitroGLYCERIN  (NITROSTAT ) SL tablet 0.4 mg (0.4 mg Sublingual Given 11/08/24 2033)  iohexol  (OMNIPAQUE ) 350 MG/ML injection 75 mL (75 mLs Intravenous Contrast Given 11/08/24 2044)  fentaNYL  (SUBLIMAZE ) injection 50 mcg (50 mcg Intravenous Given 11/08/24 2141)  sodium chloride  0.9 % bolus 1,000 mL (0 mLs Intravenous Stopped 11/09/24 0004)  iohexol  (OMNIPAQUE ) 350 MG/ML  injection 75 mL (75 mLs Intravenous Contrast Given 11/08/24 2303)                                    Medical Decision Making Amount and/or Complexity of Data Reviewed Labs: ordered. Radiology: ordered.  Risk Prescription drug management.   Cornisha H Hone is here with chest pain shortness of breath some upper abdominal pain possibly.  She has history of CAD status post stents.  History of peptic ulcer disease.  She has already had some blood work done prior to my evaluation.  However I will add a lipase as some of the discomfort in the upper abdomen.  She is already had a normal troponin.  Looks like a CT scan of the  chest has already been ordered and is negative for PE or pneumonia.  Liver enzymes gallbladder enzymes are normal.  Since she is not really tender in the right upper quadrant.  Seems less likely to be hepatobiliary process.  She does have exertional symptoms.  EKG shows sinus rhythm.  No ischemic changes.  proBNP is normal.  Overall lipase did come back elevated to 273.  Will get a CT of the abdomen and pelvis to further evaluate.  Unfortunately she did have a CT scan of her chest done before my evaluation.  Will give her some IV fluids.  Nitroglycerin  did help her pain some but then it came back.  Will give her some IV fentanyl  and reevaluate.  Overall pains improved.  CT scan was negative for pancreatitis or any other acute processes.  She did have a stress test about a month ago that was unremarkable as well.  Will contact cardiology to discuss.  Overall talked with Dr. Elmira with cardiology.  Given normal troponins recent normal stress test and pain being somewhat atypical we will have her continue follow-up outpatient.  I will add Carafate  to her Pepcid  that she already takes.  Most of the pain does seem to be in the upper abdomen.  Shortness of breath with activity seems to be somewhat chronic.  She is not having any current pain now.  She understands return if symptoms  worsen.  She will follow-up with GI cardiology and primary care.  Discharged in good condition.  This chart was dictated using voice recognition software.  Despite best efforts to proofread,  errors can occur which can change the documentation meaning.      Final diagnoses:  Nonspecific chest pain    ED Discharge Orders          Ordered    Ambulatory referral to Cardiology       Comments: If you have not heard from the Cardiology office within the next 72 hours please call [phone].   11/09/24 0008    sucralfate  (CARAFATE ) 1 g tablet  3 times daily with meals        11/08/24 2341               Ruthe Cornet, DO 11/09/24 0009  "

## 2024-11-08 NOTE — ED Triage Notes (Signed)
 Pt to er, pt states that she is here for chest pain for the past two days, states that it is worse when she takes a deep breath, pt talking in full sentences.

## 2024-11-08 NOTE — Discharge Instructions (Signed)
 Follow-up with cardiology, primary care and gastroenterology.  Return if symptoms worsen.  Add Carafate  to your famotidine  that you are already taking to help with possible stomach pain.

## 2024-11-08 NOTE — ED Notes (Signed)
 Patient transported to CT

## 2024-11-10 ENCOUNTER — Other Ambulatory Visit: Payer: Self-pay | Admitting: Family Medicine

## 2024-11-10 NOTE — Telephone Encounter (Signed)
 Last OV: 08/08/2024  Next OV:  11/24/2024  Last Refill: 05/08/2024  Dispensed: 90/1

## 2024-11-21 ENCOUNTER — Ambulatory Visit: Admitting: Vascular Surgery

## 2024-11-21 ENCOUNTER — Encounter (HOSPITAL_COMMUNITY)

## 2024-11-22 ENCOUNTER — Telehealth: Payer: Self-pay | Admitting: Hematology and Oncology

## 2024-11-22 ENCOUNTER — Inpatient Hospital Stay: Admitting: Hematology and Oncology

## 2024-11-22 NOTE — Telephone Encounter (Signed)
 I spoke to patient and she rescheduled her MD appointment from 11/22/2024 to 12/28/2024 due to inclement weather.

## 2024-11-24 ENCOUNTER — Ambulatory Visit

## 2024-11-24 ENCOUNTER — Encounter: Admitting: Family Medicine

## 2024-12-05 ENCOUNTER — Ambulatory Visit

## 2024-12-06 ENCOUNTER — Ambulatory Visit: Admitting: Family Medicine

## 2024-12-12 ENCOUNTER — Ambulatory Visit: Admitting: Cardiology

## 2024-12-28 ENCOUNTER — Inpatient Hospital Stay: Attending: Hematology and Oncology | Admitting: Hematology and Oncology

## 2025-01-08 ENCOUNTER — Ambulatory Visit: Admitting: Neurology

## 2025-02-22 ENCOUNTER — Encounter: Admitting: Family Medicine

## 2025-04-17 ENCOUNTER — Ambulatory Visit

## 2025-05-01 ENCOUNTER — Ambulatory Visit (HOSPITAL_COMMUNITY)

## 2025-05-01 ENCOUNTER — Ambulatory Visit: Admitting: Vascular Surgery

## 2025-07-24 ENCOUNTER — Ambulatory Visit: Admitting: Hematology and Oncology
